# Patient Record
Sex: Male | Born: 1945 | Race: White | Hispanic: No | Marital: Married | Smoking: Former smoker
Health system: Southern US, Community
[De-identification: ages and names within clinical notes are randomized; demographics above are authoritative.]

## PROBLEM LIST (undated history)

## (undated) DIAGNOSIS — I509 Heart failure, unspecified: Secondary | ICD-10-CM

## (undated) DIAGNOSIS — R338 Other retention of urine: Secondary | ICD-10-CM

## (undated) DIAGNOSIS — I5042 Chronic combined systolic (congestive) and diastolic (congestive) heart failure: Secondary | ICD-10-CM

## (undated) DIAGNOSIS — I429 Cardiomyopathy, unspecified: Secondary | ICD-10-CM

## (undated) DIAGNOSIS — E782 Mixed hyperlipidemia: Secondary | ICD-10-CM

## (undated) DIAGNOSIS — I340 Nonrheumatic mitral (valve) insufficiency: Secondary | ICD-10-CM

## (undated) DIAGNOSIS — D638 Anemia in other chronic diseases classified elsewhere: Secondary | ICD-10-CM

## (undated) DIAGNOSIS — I639 Cerebral infarction, unspecified: Secondary | ICD-10-CM

## (undated) DIAGNOSIS — I4891 Unspecified atrial fibrillation: Secondary | ICD-10-CM

## (undated) DIAGNOSIS — E785 Hyperlipidemia, unspecified: Secondary | ICD-10-CM

## (undated) DIAGNOSIS — N390 Urinary tract infection, site not specified: Secondary | ICD-10-CM

## (undated) DIAGNOSIS — Z87442 Personal history of urinary calculi: Secondary | ICD-10-CM

## (undated) DIAGNOSIS — Z951 Presence of aortocoronary bypass graft: Secondary | ICD-10-CM

## (undated) DIAGNOSIS — E871 Hypo-osmolality and hyponatremia: Secondary | ICD-10-CM

## (undated) DIAGNOSIS — Z8673 Personal history of transient ischemic attack (TIA), and cerebral infarction without residual deficits: Secondary | ICD-10-CM

## (undated) DIAGNOSIS — I25119 Atherosclerotic heart disease of native coronary artery with unspecified angina pectoris: Secondary | ICD-10-CM

## (undated) DIAGNOSIS — I10 Essential (primary) hypertension: Secondary | ICD-10-CM

## (undated) DIAGNOSIS — I2510 Atherosclerotic heart disease of native coronary artery without angina pectoris: Secondary | ICD-10-CM

## (undated) DIAGNOSIS — I454 Nonspecific intraventricular block: Secondary | ICD-10-CM

## (undated) DIAGNOSIS — E119 Type 2 diabetes mellitus without complications: Secondary | ICD-10-CM

## (undated) DIAGNOSIS — N9989 Other postprocedural complications and disorders of genitourinary system: Secondary | ICD-10-CM

## (undated) DIAGNOSIS — I6601 Occlusion and stenosis of right middle cerebral artery: Secondary | ICD-10-CM

## (undated) DIAGNOSIS — N401 Enlarged prostate with lower urinary tract symptoms: Secondary | ICD-10-CM

## (undated) DIAGNOSIS — A499 Bacterial infection, unspecified: Secondary | ICD-10-CM

## (undated) DIAGNOSIS — D62 Acute posthemorrhagic anemia: Secondary | ICD-10-CM

## (undated) DIAGNOSIS — E1110 Type 2 diabetes mellitus with ketoacidosis without coma: Secondary | ICD-10-CM

## (undated) DIAGNOSIS — I6930 Unspecified sequelae of cerebral infarction: Secondary | ICD-10-CM

## (undated) HISTORY — DX: Hypo-osmolality and hyponatremia: E87.1

## (undated) HISTORY — DX: Nonrheumatic mitral (valve) insufficiency: I34.0

## (undated) HISTORY — DX: Unspecified sequelae of cerebral infarction: I69.30

## (undated) HISTORY — DX: Anemia in other chronic diseases classified elsewhere: D63.8

## (undated) HISTORY — DX: Type 2 diabetes mellitus without complications: E11.9

## (undated) HISTORY — PX: CARDIAC CATHETERIZATION: SHX172

## (undated) HISTORY — DX: Occlusion and stenosis of right middle cerebral artery: I66.01

## (undated) HISTORY — DX: Personal history of transient ischemic attack (TIA), and cerebral infarction without residual deficits: Z86.73

## (undated) HISTORY — DX: Mixed hyperlipidemia: E78.2

## (undated) HISTORY — DX: Essential (primary) hypertension: I10

## (undated) HISTORY — DX: Presence of aortocoronary bypass graft: Z95.1

## (undated) HISTORY — DX: Benign prostatic hyperplasia with lower urinary tract symptoms: N40.1

## (undated) HISTORY — DX: Acute posthemorrhagic anemia: D62

## (undated) HISTORY — DX: Heart failure, unspecified: I50.9

## (undated) HISTORY — DX: Nonspecific intraventricular block: I45.4

## (undated) HISTORY — DX: Urinary tract infection, site not specified: A49.9

## (undated) HISTORY — DX: Unspecified atrial fibrillation: I48.91

## (undated) HISTORY — PX: TONSILLECTOMY: SUR1361

## (undated) HISTORY — DX: Cardiomyopathy, unspecified: I42.9

## (undated) HISTORY — PX: BRAIN SURGERY: SHX531

## (undated) HISTORY — DX: Chronic combined systolic (congestive) and diastolic (congestive) heart failure: I50.42

## (undated) HISTORY — DX: Urinary tract infection, site not specified: N39.0

## (undated) HISTORY — DX: Type 2 diabetes mellitus with ketoacidosis without coma: E11.10

## (undated) HISTORY — DX: Hyperlipidemia, unspecified: E78.5

## (undated) HISTORY — DX: Atherosclerotic heart disease of native coronary artery with unspecified angina pectoris: I25.119

## (undated) HISTORY — DX: Cerebral infarction, unspecified: I63.9

---

## 2014-07-08 DIAGNOSIS — I110 Hypertensive heart disease with heart failure: Secondary | ICD-10-CM | POA: Diagnosis not present

## 2014-07-08 DIAGNOSIS — E785 Hyperlipidemia, unspecified: Secondary | ICD-10-CM | POA: Diagnosis not present

## 2014-07-08 DIAGNOSIS — I447 Left bundle-branch block, unspecified: Secondary | ICD-10-CM | POA: Diagnosis not present

## 2014-07-08 DIAGNOSIS — I5032 Chronic diastolic (congestive) heart failure: Secondary | ICD-10-CM | POA: Diagnosis not present

## 2014-07-08 DIAGNOSIS — E119 Type 2 diabetes mellitus without complications: Secondary | ICD-10-CM | POA: Diagnosis not present

## 2015-04-24 DIAGNOSIS — I429 Cardiomyopathy, unspecified: Secondary | ICD-10-CM | POA: Insufficient documentation

## 2015-04-24 DIAGNOSIS — I480 Paroxysmal atrial fibrillation: Secondary | ICD-10-CM

## 2015-04-24 DIAGNOSIS — I454 Nonspecific intraventricular block: Secondary | ICD-10-CM

## 2015-04-24 DIAGNOSIS — I5042 Chronic combined systolic (congestive) and diastolic (congestive) heart failure: Secondary | ICD-10-CM

## 2015-04-24 DIAGNOSIS — I5022 Chronic systolic (congestive) heart failure: Secondary | ICD-10-CM

## 2015-04-24 HISTORY — DX: Chronic combined systolic (congestive) and diastolic (congestive) heart failure: I50.42

## 2015-04-24 HISTORY — DX: Chronic systolic (congestive) heart failure: I50.22

## 2015-04-24 HISTORY — DX: Paroxysmal atrial fibrillation: I48.0

## 2015-04-24 HISTORY — DX: Nonspecific intraventricular block: I45.4

## 2015-04-25 DIAGNOSIS — E785 Hyperlipidemia, unspecified: Secondary | ICD-10-CM | POA: Diagnosis not present

## 2015-04-25 DIAGNOSIS — I429 Cardiomyopathy, unspecified: Secondary | ICD-10-CM | POA: Diagnosis not present

## 2015-04-25 DIAGNOSIS — I110 Hypertensive heart disease with heart failure: Secondary | ICD-10-CM | POA: Diagnosis not present

## 2015-04-25 DIAGNOSIS — I480 Paroxysmal atrial fibrillation: Secondary | ICD-10-CM | POA: Diagnosis not present

## 2015-04-25 DIAGNOSIS — I5042 Chronic combined systolic (congestive) and diastolic (congestive) heart failure: Secondary | ICD-10-CM | POA: Diagnosis not present

## 2015-07-30 DIAGNOSIS — E1165 Type 2 diabetes mellitus with hyperglycemia: Secondary | ICD-10-CM | POA: Diagnosis not present

## 2015-07-30 DIAGNOSIS — Z79899 Other long term (current) drug therapy: Secondary | ICD-10-CM | POA: Diagnosis not present

## 2015-07-30 DIAGNOSIS — Z0000 Encounter for general adult medical examination without abnormal findings: Secondary | ICD-10-CM | POA: Diagnosis not present

## 2015-07-30 DIAGNOSIS — I10 Essential (primary) hypertension: Secondary | ICD-10-CM | POA: Diagnosis not present

## 2015-07-30 DIAGNOSIS — E559 Vitamin D deficiency, unspecified: Secondary | ICD-10-CM | POA: Diagnosis not present

## 2015-07-30 DIAGNOSIS — Z125 Encounter for screening for malignant neoplasm of prostate: Secondary | ICD-10-CM | POA: Diagnosis not present

## 2015-07-30 DIAGNOSIS — E782 Mixed hyperlipidemia: Secondary | ICD-10-CM | POA: Diagnosis not present

## 2015-08-11 DIAGNOSIS — I10 Essential (primary) hypertension: Secondary | ICD-10-CM | POA: Diagnosis not present

## 2015-08-11 DIAGNOSIS — E782 Mixed hyperlipidemia: Secondary | ICD-10-CM | POA: Diagnosis not present

## 2015-08-11 DIAGNOSIS — E1165 Type 2 diabetes mellitus with hyperglycemia: Secondary | ICD-10-CM | POA: Diagnosis not present

## 2015-08-25 DIAGNOSIS — I429 Cardiomyopathy, unspecified: Secondary | ICD-10-CM | POA: Diagnosis not present

## 2015-08-25 DIAGNOSIS — I447 Left bundle-branch block, unspecified: Secondary | ICD-10-CM | POA: Diagnosis not present

## 2015-08-28 DIAGNOSIS — I5022 Chronic systolic (congestive) heart failure: Secondary | ICD-10-CM | POA: Diagnosis not present

## 2015-08-28 DIAGNOSIS — E1165 Type 2 diabetes mellitus with hyperglycemia: Secondary | ICD-10-CM | POA: Diagnosis not present

## 2015-08-28 DIAGNOSIS — Z79899 Other long term (current) drug therapy: Secondary | ICD-10-CM | POA: Diagnosis not present

## 2015-11-19 DIAGNOSIS — I447 Left bundle-branch block, unspecified: Secondary | ICD-10-CM | POA: Diagnosis not present

## 2015-11-19 DIAGNOSIS — I5042 Chronic combined systolic (congestive) and diastolic (congestive) heart failure: Secondary | ICD-10-CM | POA: Diagnosis not present

## 2015-11-19 DIAGNOSIS — I4891 Unspecified atrial fibrillation: Secondary | ICD-10-CM | POA: Diagnosis not present

## 2015-11-19 DIAGNOSIS — I454 Nonspecific intraventricular block: Secondary | ICD-10-CM | POA: Diagnosis not present

## 2015-11-19 DIAGNOSIS — I429 Cardiomyopathy, unspecified: Secondary | ICD-10-CM | POA: Diagnosis not present

## 2016-02-23 DIAGNOSIS — I447 Left bundle-branch block, unspecified: Secondary | ICD-10-CM | POA: Diagnosis not present

## 2016-02-25 DIAGNOSIS — I447 Left bundle-branch block, unspecified: Secondary | ICD-10-CM | POA: Diagnosis not present

## 2016-03-10 DIAGNOSIS — I5042 Chronic combined systolic (congestive) and diastolic (congestive) heart failure: Secondary | ICD-10-CM | POA: Diagnosis not present

## 2016-03-10 DIAGNOSIS — I110 Hypertensive heart disease with heart failure: Secondary | ICD-10-CM | POA: Diagnosis not present

## 2016-03-10 DIAGNOSIS — I447 Left bundle-branch block, unspecified: Secondary | ICD-10-CM | POA: Diagnosis not present

## 2016-04-20 DIAGNOSIS — E1165 Type 2 diabetes mellitus with hyperglycemia: Secondary | ICD-10-CM | POA: Diagnosis not present

## 2016-04-20 DIAGNOSIS — E782 Mixed hyperlipidemia: Secondary | ICD-10-CM | POA: Diagnosis not present

## 2016-04-20 DIAGNOSIS — I119 Hypertensive heart disease without heart failure: Secondary | ICD-10-CM | POA: Diagnosis not present

## 2016-04-20 DIAGNOSIS — I10 Essential (primary) hypertension: Secondary | ICD-10-CM | POA: Diagnosis not present

## 2016-04-20 DIAGNOSIS — Z1211 Encounter for screening for malignant neoplasm of colon: Secondary | ICD-10-CM | POA: Diagnosis not present

## 2016-08-11 DIAGNOSIS — J40 Bronchitis, not specified as acute or chronic: Secondary | ICD-10-CM | POA: Diagnosis not present

## 2016-08-11 DIAGNOSIS — J329 Chronic sinusitis, unspecified: Secondary | ICD-10-CM | POA: Diagnosis not present

## 2016-10-13 DIAGNOSIS — N200 Calculus of kidney: Secondary | ICD-10-CM | POA: Diagnosis not present

## 2016-10-15 DIAGNOSIS — R109 Unspecified abdominal pain: Secondary | ICD-10-CM | POA: Diagnosis not present

## 2016-10-15 DIAGNOSIS — Z87442 Personal history of urinary calculi: Secondary | ICD-10-CM | POA: Diagnosis not present

## 2016-10-15 DIAGNOSIS — N201 Calculus of ureter: Secondary | ICD-10-CM | POA: Diagnosis not present

## 2016-10-18 DIAGNOSIS — I110 Hypertensive heart disease with heart failure: Secondary | ICD-10-CM | POA: Diagnosis not present

## 2016-10-18 DIAGNOSIS — I447 Left bundle-branch block, unspecified: Secondary | ICD-10-CM | POA: Diagnosis not present

## 2016-10-18 DIAGNOSIS — I5042 Chronic combined systolic (congestive) and diastolic (congestive) heart failure: Secondary | ICD-10-CM | POA: Diagnosis not present

## 2016-10-18 DIAGNOSIS — I340 Nonrheumatic mitral (valve) insufficiency: Secondary | ICD-10-CM | POA: Diagnosis not present

## 2016-10-25 DIAGNOSIS — N201 Calculus of ureter: Secondary | ICD-10-CM | POA: Diagnosis not present

## 2016-10-25 DIAGNOSIS — N200 Calculus of kidney: Secondary | ICD-10-CM | POA: Diagnosis not present

## 2016-10-25 DIAGNOSIS — R1032 Left lower quadrant pain: Secondary | ICD-10-CM | POA: Diagnosis not present

## 2016-10-25 DIAGNOSIS — N401 Enlarged prostate with lower urinary tract symptoms: Secondary | ICD-10-CM | POA: Diagnosis not present

## 2016-10-28 DIAGNOSIS — Z01810 Encounter for preprocedural cardiovascular examination: Secondary | ICD-10-CM | POA: Diagnosis not present

## 2016-10-28 DIAGNOSIS — I2510 Atherosclerotic heart disease of native coronary artery without angina pectoris: Secondary | ICD-10-CM | POA: Diagnosis not present

## 2016-10-28 DIAGNOSIS — Z79899 Other long term (current) drug therapy: Secondary | ICD-10-CM | POA: Diagnosis not present

## 2016-10-28 DIAGNOSIS — Z87891 Personal history of nicotine dependence: Secondary | ICD-10-CM | POA: Diagnosis not present

## 2016-10-28 DIAGNOSIS — I10 Essential (primary) hypertension: Secondary | ICD-10-CM | POA: Diagnosis not present

## 2016-10-28 DIAGNOSIS — Z7984 Long term (current) use of oral hypoglycemic drugs: Secondary | ICD-10-CM | POA: Diagnosis not present

## 2016-10-28 DIAGNOSIS — I499 Cardiac arrhythmia, unspecified: Secondary | ICD-10-CM | POA: Diagnosis not present

## 2016-10-28 DIAGNOSIS — E119 Type 2 diabetes mellitus without complications: Secondary | ICD-10-CM | POA: Diagnosis not present

## 2016-10-28 DIAGNOSIS — N201 Calculus of ureter: Secondary | ICD-10-CM | POA: Diagnosis not present

## 2016-10-29 DIAGNOSIS — I499 Cardiac arrhythmia, unspecified: Secondary | ICD-10-CM | POA: Diagnosis not present

## 2016-10-29 DIAGNOSIS — Z79899 Other long term (current) drug therapy: Secondary | ICD-10-CM | POA: Diagnosis not present

## 2016-10-29 DIAGNOSIS — I10 Essential (primary) hypertension: Secondary | ICD-10-CM | POA: Diagnosis not present

## 2016-10-29 DIAGNOSIS — N202 Calculus of kidney with calculus of ureter: Secondary | ICD-10-CM | POA: Diagnosis not present

## 2016-10-29 DIAGNOSIS — Z7984 Long term (current) use of oral hypoglycemic drugs: Secondary | ICD-10-CM | POA: Diagnosis not present

## 2016-10-29 DIAGNOSIS — E119 Type 2 diabetes mellitus without complications: Secondary | ICD-10-CM | POA: Diagnosis not present

## 2016-10-29 DIAGNOSIS — Z87891 Personal history of nicotine dependence: Secondary | ICD-10-CM | POA: Diagnosis not present

## 2016-10-29 DIAGNOSIS — I2510 Atherosclerotic heart disease of native coronary artery without angina pectoris: Secondary | ICD-10-CM | POA: Diagnosis not present

## 2016-10-29 DIAGNOSIS — N201 Calculus of ureter: Secondary | ICD-10-CM | POA: Diagnosis not present

## 2016-11-08 DIAGNOSIS — N201 Calculus of ureter: Secondary | ICD-10-CM | POA: Diagnosis not present

## 2016-11-08 DIAGNOSIS — R1032 Left lower quadrant pain: Secondary | ICD-10-CM | POA: Diagnosis not present

## 2016-11-08 DIAGNOSIS — N200 Calculus of kidney: Secondary | ICD-10-CM | POA: Diagnosis not present

## 2016-11-15 DIAGNOSIS — I447 Left bundle-branch block, unspecified: Secondary | ICD-10-CM | POA: Diagnosis not present

## 2016-11-15 DIAGNOSIS — I5042 Chronic combined systolic (congestive) and diastolic (congestive) heart failure: Secondary | ICD-10-CM | POA: Diagnosis not present

## 2016-11-22 DIAGNOSIS — K529 Noninfective gastroenteritis and colitis, unspecified: Secondary | ICD-10-CM | POA: Diagnosis not present

## 2016-11-26 DIAGNOSIS — E782 Mixed hyperlipidemia: Secondary | ICD-10-CM | POA: Insufficient documentation

## 2016-11-26 DIAGNOSIS — E1165 Type 2 diabetes mellitus with hyperglycemia: Secondary | ICD-10-CM | POA: Diagnosis not present

## 2016-11-26 DIAGNOSIS — I10 Essential (primary) hypertension: Secondary | ICD-10-CM

## 2016-11-26 DIAGNOSIS — I110 Hypertensive heart disease with heart failure: Secondary | ICD-10-CM | POA: Insufficient documentation

## 2016-11-26 DIAGNOSIS — I429 Cardiomyopathy, unspecified: Secondary | ICD-10-CM | POA: Diagnosis not present

## 2016-11-26 HISTORY — DX: Mixed hyperlipidemia: E78.2

## 2016-11-26 HISTORY — DX: Essential (primary) hypertension: I10

## 2016-11-26 HISTORY — DX: Hypertensive heart disease with heart failure: I11.0

## 2016-12-27 DIAGNOSIS — N200 Calculus of kidney: Secondary | ICD-10-CM | POA: Diagnosis not present

## 2016-12-27 DIAGNOSIS — N401 Enlarged prostate with lower urinary tract symptoms: Secondary | ICD-10-CM | POA: Diagnosis not present

## 2017-04-07 DIAGNOSIS — R42 Dizziness and giddiness: Secondary | ICD-10-CM | POA: Diagnosis not present

## 2017-04-07 DIAGNOSIS — H6123 Impacted cerumen, bilateral: Secondary | ICD-10-CM | POA: Diagnosis not present

## 2017-04-11 DIAGNOSIS — M791 Myalgia, unspecified site: Secondary | ICD-10-CM | POA: Diagnosis not present

## 2017-04-11 DIAGNOSIS — H6011 Cellulitis of right external ear: Secondary | ICD-10-CM | POA: Diagnosis not present

## 2017-04-28 ENCOUNTER — Inpatient Hospital Stay (HOSPITAL_COMMUNITY)
Admission: AD | Admit: 2017-04-28 | Discharge: 2017-05-03 | DRG: 023 | Disposition: A | Discharge status: Home Health | Payer: Medicare Other | Source: Other Acute Inpatient Hospital | Attending: Neurology | Admitting: Neurology

## 2017-04-28 DIAGNOSIS — I952 Hypotension due to drugs: Secondary | ICD-10-CM | POA: Diagnosis not present

## 2017-04-28 DIAGNOSIS — I340 Nonrheumatic mitral (valve) insufficiency: Secondary | ICD-10-CM | POA: Diagnosis not present

## 2017-04-28 DIAGNOSIS — I110 Hypertensive heart disease with heart failure: Secondary | ICD-10-CM | POA: Diagnosis present

## 2017-04-28 DIAGNOSIS — Z6834 Body mass index (BMI) 34.0-34.9, adult: Secondary | ICD-10-CM

## 2017-04-28 DIAGNOSIS — I255 Ischemic cardiomyopathy: Secondary | ICD-10-CM | POA: Diagnosis not present

## 2017-04-28 DIAGNOSIS — I63411 Cerebral infarction due to embolism of right middle cerebral artery: Secondary | ICD-10-CM | POA: Diagnosis present

## 2017-04-28 DIAGNOSIS — E1110 Type 2 diabetes mellitus with ketoacidosis without coma: Secondary | ICD-10-CM

## 2017-04-28 DIAGNOSIS — I639 Cerebral infarction, unspecified: Secondary | ICD-10-CM

## 2017-04-28 DIAGNOSIS — I4891 Unspecified atrial fibrillation: Secondary | ICD-10-CM | POA: Diagnosis present

## 2017-04-28 DIAGNOSIS — I959 Hypotension, unspecified: Secondary | ICD-10-CM

## 2017-04-28 DIAGNOSIS — Z7984 Long term (current) use of oral hypoglycemic drugs: Secondary | ICD-10-CM

## 2017-04-28 DIAGNOSIS — I480 Paroxysmal atrial fibrillation: Secondary | ICD-10-CM

## 2017-04-28 DIAGNOSIS — Z79899 Other long term (current) drug therapy: Secondary | ICD-10-CM

## 2017-04-28 DIAGNOSIS — E785 Hyperlipidemia, unspecified: Secondary | ICD-10-CM | POA: Diagnosis not present

## 2017-04-28 DIAGNOSIS — E1165 Type 2 diabetes mellitus with hyperglycemia: Secondary | ICD-10-CM | POA: Diagnosis not present

## 2017-04-28 DIAGNOSIS — I5042 Chronic combined systolic (congestive) and diastolic (congestive) heart failure: Secondary | ICD-10-CM | POA: Diagnosis present

## 2017-04-28 DIAGNOSIS — I420 Dilated cardiomyopathy: Secondary | ICD-10-CM

## 2017-04-28 DIAGNOSIS — R29713 NIHSS score 13: Secondary | ICD-10-CM | POA: Diagnosis present

## 2017-04-28 DIAGNOSIS — I619 Nontraumatic intracerebral hemorrhage, unspecified: Secondary | ICD-10-CM | POA: Diagnosis present

## 2017-04-28 DIAGNOSIS — E669 Obesity, unspecified: Secondary | ICD-10-CM | POA: Diagnosis present

## 2017-04-28 DIAGNOSIS — I63511 Cerebral infarction due to unspecified occlusion or stenosis of right middle cerebral artery: Secondary | ICD-10-CM | POA: Diagnosis not present

## 2017-04-28 DIAGNOSIS — B3324 Viral cardiomyopathy: Secondary | ICD-10-CM | POA: Diagnosis present

## 2017-04-28 DIAGNOSIS — I5022 Chronic systolic (congestive) heart failure: Secondary | ICD-10-CM | POA: Diagnosis not present

## 2017-04-28 DIAGNOSIS — J9600 Acute respiratory failure, unspecified whether with hypoxia or hypercapnia: Secondary | ICD-10-CM

## 2017-04-28 DIAGNOSIS — R0681 Apnea, not elsewhere classified: Secondary | ICD-10-CM | POA: Diagnosis not present

## 2017-04-28 DIAGNOSIS — T464X5A Adverse effect of angiotensin-converting-enzyme inhibitors, initial encounter: Secondary | ICD-10-CM | POA: Diagnosis not present

## 2017-04-28 DIAGNOSIS — T461X5A Adverse effect of calcium-channel blockers, initial encounter: Secondary | ICD-10-CM | POA: Diagnosis not present

## 2017-04-28 DIAGNOSIS — I509 Heart failure, unspecified: Secondary | ICD-10-CM

## 2017-04-28 DIAGNOSIS — I6330 Cerebral infarction due to thrombosis of unspecified cerebral artery: Secondary | ICD-10-CM

## 2017-04-28 DIAGNOSIS — R29810 Facial weakness: Secondary | ICD-10-CM | POA: Diagnosis present

## 2017-04-28 DIAGNOSIS — I6389 Other cerebral infarction: Secondary | ICD-10-CM | POA: Diagnosis not present

## 2017-04-28 DIAGNOSIS — Z7982 Long term (current) use of aspirin: Secondary | ICD-10-CM

## 2017-04-28 DIAGNOSIS — D72829 Elevated white blood cell count, unspecified: Secondary | ICD-10-CM | POA: Diagnosis not present

## 2017-04-28 DIAGNOSIS — G8194 Hemiplegia, unspecified affecting left nondominant side: Secondary | ICD-10-CM | POA: Diagnosis present

## 2017-04-28 DIAGNOSIS — Z9282 Status post administration of tPA (rtPA) in a different facility within the last 24 hours prior to admission to current facility: Secondary | ICD-10-CM

## 2017-04-29 ENCOUNTER — Encounter (HOSPITAL_COMMUNITY)
Admission: AD | Disposition: A | Discharge status: Home Health | Payer: Self-pay | Source: Other Acute Inpatient Hospital | Attending: Neurology

## 2017-04-29 ENCOUNTER — Inpatient Hospital Stay (HOSPITAL_COMMUNITY): Payer: Medicare Other

## 2017-04-29 ENCOUNTER — Inpatient Hospital Stay (HOSPITAL_COMMUNITY): Payer: Medicare Other | Admitting: Certified Registered"

## 2017-04-29 ENCOUNTER — Encounter (HOSPITAL_COMMUNITY): Payer: Self-pay | Admitting: Radiology

## 2017-04-29 DIAGNOSIS — I63311 Cerebral infarction due to thrombosis of right middle cerebral artery: Secondary | ICD-10-CM

## 2017-04-29 DIAGNOSIS — I420 Dilated cardiomyopathy: Secondary | ICD-10-CM

## 2017-04-29 DIAGNOSIS — I340 Nonrheumatic mitral (valve) insufficiency: Secondary | ICD-10-CM

## 2017-04-29 DIAGNOSIS — I480 Paroxysmal atrial fibrillation: Secondary | ICD-10-CM

## 2017-04-29 DIAGNOSIS — I639 Cerebral infarction, unspecified: Secondary | ICD-10-CM

## 2017-04-29 DIAGNOSIS — I63411 Cerebral infarction due to embolism of right middle cerebral artery: Secondary | ICD-10-CM

## 2017-04-29 DIAGNOSIS — I63511 Cerebral infarction due to unspecified occlusion or stenosis of right middle cerebral artery: Principal | ICD-10-CM

## 2017-04-29 HISTORY — DX: Cerebral infarction due to embolism of right middle cerebral artery: I63.411

## 2017-04-29 HISTORY — PX: IR US GUIDE VASC ACCESS LEFT: IMG2389

## 2017-04-29 HISTORY — PX: IR PERCUTANEOUS ART THROMBECTOMY/INFUSION INTRACRANIAL INC DIAG ANGIO: IMG6087

## 2017-04-29 HISTORY — PX: IR US GUIDE VASC ACCESS RIGHT: IMG2390

## 2017-04-29 HISTORY — DX: Cerebral infarction, unspecified: I63.9

## 2017-04-29 HISTORY — PX: RADIOLOGY WITH ANESTHESIA: SHX6223

## 2017-04-29 LAB — GLUCOSE, CAPILLARY
GLUCOSE-CAPILLARY: 328 mg/dL — AB (ref 65–99)
GLUCOSE-CAPILLARY: 315 mg/dL — AB (ref 65–99)
GLUCOSE-CAPILLARY: 300 mg/dL — AB (ref 65–99)
GLUCOSE-CAPILLARY: 217 mg/dL — AB (ref 65–99)
Glucose-Capillary: 273 mg/dL — ABNORMAL HIGH (ref 65–99)
Glucose-Capillary: 311 mg/dL — ABNORMAL HIGH (ref 65–99)
Glucose-Capillary: 359 mg/dL — ABNORMAL HIGH (ref 65–99)
Glucose-Capillary: 272 mg/dL — ABNORMAL HIGH (ref 65–99)
Glucose-Capillary: 251 mg/dL — ABNORMAL HIGH (ref 65–99)
Glucose-Capillary: 147 mg/dL — ABNORMAL HIGH (ref 65–99)
Glucose-Capillary: 184 mg/dL — ABNORMAL HIGH (ref 65–99)
Glucose-Capillary: 136 mg/dL — ABNORMAL HIGH (ref 65–99)

## 2017-04-29 LAB — COMPREHENSIVE METABOLIC PANEL WITH GFR
ALT: 22 U/L (ref 17–63)
AST: 22 U/L (ref 15–41)
Albumin: 3.7 g/dL (ref 3.5–5.0)
Alkaline Phosphatase: 92 U/L (ref 38–126)
Anion gap: 7 (ref 5–15)
BUN: 16 mg/dL (ref 6–20)
CO2: 23 mmol/L (ref 22–32)
Calcium: 8.8 mg/dL — ABNORMAL LOW (ref 8.9–10.3)
Chloride: 103 mmol/L (ref 101–111)
Creatinine, Ser: 0.89 mg/dL (ref 0.61–1.24)
GFR calc Af Amer: >60 mL/min
GFR calc non Af Amer: >60 mL/min
Glucose, Bld: 273 mg/dL — ABNORMAL HIGH (ref 65–99)
Potassium: 4.4 mmol/L (ref 3.5–5.1)
Sodium: 133 mmol/L — ABNORMAL LOW (ref 135–145)
Total Bilirubin: 0.9 mg/dL (ref 0.3–1.2)
Total Protein: 7.2 g/dL (ref 6.5–8.1)

## 2017-04-29 LAB — CBC
HEMATOCRIT: 39.6 % (ref 39.0–52.0)
HEMOGLOBIN: 13.8 g/dL (ref 13.0–17.0)
MCH: 31.7 pg (ref 26.0–34.0)
MCHC: 34.8 g/dL (ref 30.0–36.0)
MCV: 91 fL (ref 78.0–100.0)
Platelets: 205 10*3/uL (ref 150–400)
RBC: 4.35 MIL/uL (ref 4.22–5.81)
RDW: 12.1 % (ref 11.5–15.5)
WBC: 14.3 10*3/uL — AB (ref 4.0–10.5)

## 2017-04-29 LAB — MRSA PCR SCREENING: MRSA BY PCR: NEGATIVE

## 2017-04-29 LAB — POCT I-STAT 3, ART BLOOD GAS (G3+)
Acid-base deficit: 4 mmol/L — ABNORMAL HIGH (ref 0.0–2.0)
BICARBONATE: 22.2 mmol/L (ref 20.0–28.0)
O2 Saturation: 97 %
PCO2 ART: 41.9 mmHg (ref 32.0–48.0)
PH ART: 7.33 — AB (ref 7.350–7.450)
PO2 ART: 92 mmHg (ref 83.0–108.0)
Patient temperature: 97.6
TCO2: 24 mmol/L (ref 22–32)

## 2017-04-29 LAB — ECHOCARDIOGRAM COMPLETE
Ao-asc: 35 cm
CHL CUP MV DEC (S): 190
CHL CUP REG VEL DIAS: 86.9 cm/s
E decel time: 190 msec
FS: 20 % — AB (ref 28–44)
IV/PV OW: 1
LA vol index: 28.9 mL/m2
LADIAMINDEX: 1.86 cm/m2
LASIZE: 45 mm
LAVOL: 69.7 mL
LAVOLA4C: 56.4 mL
LDCA: 3.8 cm2
LEFT ATRIUM END SYS DIAM: 45 mm
LV PW d: 11 mm — AB (ref 0.6–1.1)
LV TDI E'LATERAL: 7.72
LV e' LATERAL: 7.72 cm/s
LVOT diameter: 22 mm
MV VTI: 148 cm
MVPKEVEL: 1.1 m/s
RV LATERAL S' VELOCITY: 21.8 cm/s
TAPSE: 20.8 mm
TDI e' medial: 5.33

## 2017-04-29 LAB — LIPID PANEL
Cholesterol: 102 mg/dL (ref 0–200)
HDL: 30 mg/dL — ABNORMAL LOW
LDL Cholesterol: 51 mg/dL (ref 0–99)
Total CHOL/HDL Ratio: 3.4 ratio
Triglycerides: 104 mg/dL
VLDL: 21 mg/dL (ref 0–40)

## 2017-04-29 LAB — HEMOGLOBIN A1C
Hgb A1c MFr Bld: 8.4 % — ABNORMAL HIGH (ref 4.8–5.6)
Mean Plasma Glucose: 194.38 mg/dL

## 2017-04-29 SURGERY — IR WITH ANESTHESIA
Anesthesia: General

## 2017-04-29 MED ORDER — INSULIN ASPART 100 UNIT/ML ~~LOC~~ SOLN: 2.0000 [IU] | SUBCUTANEOUS | Status: DC

## 2017-04-29 MED ORDER — ASPIRIN EC 81 MG PO TBEC
81.0000 mg | DELAYED_RELEASE_TABLET | Freq: Every day | ORAL | Status: DC
  Filled 2017-04-29: qty 1

## 2017-04-29 MED ORDER — SUFENTANIL CITRATE 50 MCG/ML IV SOLN
INTRAVENOUS | Status: DC | PRN
  Administered 2017-04-29 (×3): 10 ug via INTRAVENOUS
  Administered 2017-04-29: 20 ug via INTRAVENOUS

## 2017-04-29 MED ORDER — LIDOCAINE HCL (CARDIAC) 20 MG/ML IV SOLN
INTRAVENOUS | Status: DC | PRN
  Administered 2017-04-29: 100 mg via INTRATRACHEAL

## 2017-04-29 MED ORDER — CEFAZOLIN SODIUM-DEXTROSE 2-4 GM/100ML-% IV SOLN
INTRAVENOUS | Status: AC
  Filled 2017-04-29: qty 100

## 2017-04-29 MED ORDER — ONDANSETRON HCL 4 MG/2ML IJ SOLN
INTRAMUSCULAR | Status: DC | PRN
  Administered 2017-04-29: 4 mg via INTRAVENOUS

## 2017-04-29 MED ORDER — PHENYLEPHRINE HCL 10 MG/ML IJ SOLN
INTRAMUSCULAR | Status: DC | PRN
  Administered 2017-04-29: 80 ug via INTRAVENOUS
  Administered 2017-04-29: 160 ug via INTRAVENOUS

## 2017-04-29 MED ORDER — PANTOPRAZOLE SODIUM 40 MG IV SOLR: 40.0000 mg | Freq: Every day | INTRAVENOUS | Status: DC

## 2017-04-29 MED ORDER — ATROPINE SULFATE 0.4 MG/ML IJ SOLN
INTRAMUSCULAR | Status: DC | PRN
  Administered 2017-04-29: 0.4 mg via INTRAVENOUS

## 2017-04-29 MED ORDER — IOPAMIDOL (ISOVUE-370) INJECTION 76%
INTRAVENOUS | Status: AC
  Filled 2017-04-29: qty 50

## 2017-04-29 MED ORDER — INSULIN ASPART 100 UNIT/ML ~~LOC~~ SOLN: 0.0000 [IU] | SUBCUTANEOUS | Status: DC

## 2017-04-29 MED ORDER — ACETAMINOPHEN 650 MG RE SUPP: 650.0000 mg | RECTAL | Status: DC | PRN

## 2017-04-29 MED ORDER — SODIUM CHLORIDE 0.9 % IV SOLN
INTRAVENOUS | Status: DC
  Administered 2017-04-29: 2.6 [IU]/h via INTRAVENOUS
  Filled 2017-04-29: qty 1

## 2017-04-29 MED ORDER — IOPAMIDOL (ISOVUE-300) INJECTION 61%
INTRAVENOUS | Status: AC
  Administered 2017-04-29: 75 mL
  Filled 2017-04-29: qty 300

## 2017-04-29 MED ORDER — NITROGLYCERIN 1 MG/10 ML FOR IR/CATH LAB
INTRA_ARTERIAL | Status: AC
  Filled 2017-04-29: qty 10

## 2017-04-29 MED ORDER — SUFENTANIL CITRATE 50 MCG/ML IV SOLN
INTRAVENOUS | Status: AC
  Filled 2017-04-29: qty 1

## 2017-04-29 MED ORDER — LACTATED RINGERS IV SOLN
INTRAVENOUS | Status: DC | PRN
  Administered 2017-04-29: 02:00:00 via INTRAVENOUS

## 2017-04-29 MED ORDER — ACETAMINOPHEN 160 MG/5ML PO SOLN: 650.0000 mg | ORAL | Status: DC | PRN

## 2017-04-29 MED ORDER — CEFAZOLIN SODIUM-DEXTROSE 2-3 GM-%(50ML) IV SOLR
INTRAVENOUS | Status: DC | PRN
  Administered 2017-04-29: 2 g via INTRAVENOUS

## 2017-04-29 MED ORDER — PERFLUTREN LIPID MICROSPHERE
INTRAVENOUS | Status: AC
  Administered 2017-04-29: 2 mL
  Filled 2017-04-29: qty 10

## 2017-04-29 MED ORDER — ASPIRIN 325 MG PO TABS
325.0000 mg | ORAL_TABLET | Freq: Every day | ORAL | Status: DC
  Administered 2017-04-30 – 2017-05-02 (×4): 325 mg via ORAL
  Filled 2017-04-29 (×4): qty 1

## 2017-04-29 MED ORDER — SUGAMMADEX SODIUM 200 MG/2ML IV SOLN
INTRAVENOUS | Status: DC | PRN
  Administered 2017-04-29: 200 mg via INTRAVENOUS

## 2017-04-29 MED ORDER — CLEVIDIPINE BUTYRATE 0.5 MG/ML IV EMUL
0.0000 mg/h | INTRAVENOUS | Status: DC
  Administered 2017-04-29: 16 mg/h via INTRAVENOUS
  Administered 2017-04-29: 7 mg/h via INTRAVENOUS
  Filled 2017-04-29 (×3): qty 50

## 2017-04-29 MED ORDER — PROPOFOL 10 MG/ML IV BOLUS
INTRAVENOUS | Status: DC | PRN
  Administered 2017-04-29: 120 mg via INTRAVENOUS
  Administered 2017-04-29: 30 mg via INTRAVENOUS

## 2017-04-29 MED ORDER — ONDANSETRON HCL 4 MG/2ML IJ SOLN
4.0000 mg | Freq: Once | INTRAMUSCULAR | Status: AC
  Administered 2017-04-29: 4 mg via INTRAVENOUS

## 2017-04-29 MED ORDER — EPTIFIBATIDE 20 MG/10ML IV SOLN
INTRAVENOUS | Status: AC
  Filled 2017-04-29: qty 10

## 2017-04-29 MED ORDER — CLEVIDIPINE BUTYRATE 0.5 MG/ML IV EMUL
0.0000 mg/h | INTRAVENOUS | Status: DC
  Administered 2017-04-29: 16 mg/h via INTRAVENOUS
  Administered 2017-04-29: 1 mg/h via INTRAVENOUS
  Administered 2017-04-29: 16 mg/h via INTRAVENOUS
  Administered 2017-04-29: 17 mg/h via INTRAVENOUS
  Filled 2017-04-29 (×5): qty 50

## 2017-04-29 MED ORDER — SUCCINYLCHOLINE CHLORIDE 20 MG/ML IJ SOLN
INTRAMUSCULAR | Status: DC | PRN
  Administered 2017-04-29: 120 mg via INTRAVENOUS

## 2017-04-29 MED ORDER — INSULIN ASPART 100 UNIT/ML ~~LOC~~ SOLN
8.0000 [IU] | Freq: Once | SUBCUTANEOUS | Status: AC
  Administered 2017-04-29: 8 [IU] via SUBCUTANEOUS

## 2017-04-29 MED ORDER — STROKE: EARLY STAGES OF RECOVERY BOOK
Freq: Once | Status: AC
  Administered 2017-04-29: 01:00:00
  Filled 2017-04-29: qty 1

## 2017-04-29 MED ORDER — ROCURONIUM BROMIDE 100 MG/10ML IV SOLN
INTRAVENOUS | Status: DC | PRN
  Administered 2017-04-29: 50 mg via INTRAVENOUS

## 2017-04-29 MED ORDER — INSULIN ASPART 100 UNIT/ML ~~LOC~~ SOLN
0.0000 [IU] | SUBCUTANEOUS | Status: DC
  Administered 2017-04-30: 2 [IU] via SUBCUTANEOUS
  Administered 2017-04-30: 3 [IU] via SUBCUTANEOUS
  Administered 2017-04-30: 2 [IU] via SUBCUTANEOUS
  Administered 2017-04-30: 5 [IU] via SUBCUTANEOUS
  Administered 2017-04-30 (×2): 3 [IU] via SUBCUTANEOUS
  Administered 2017-05-01: 2 [IU] via SUBCUTANEOUS

## 2017-04-29 MED ORDER — PERFLUTREN LIPID MICROSPHERE
1.0000 mL | INTRAVENOUS | Status: AC | PRN
  Filled 2017-04-29: qty 10

## 2017-04-29 MED ORDER — PROPOFOL 10 MG/ML IV BOLUS
INTRAVENOUS | Status: AC
  Filled 2017-04-29: qty 20

## 2017-04-29 MED ORDER — ONDANSETRON HCL 4 MG/2ML IJ SOLN
4.0000 mg | Freq: Four times a day (QID) | INTRAMUSCULAR | Status: DC | PRN
  Administered 2017-05-01: 4 mg via INTRAVENOUS
  Filled 2017-04-29 (×2): qty 2

## 2017-04-29 MED ORDER — INSULIN ASPART 100 UNIT/ML ~~LOC~~ SOLN
0.0000 [IU] | SUBCUTANEOUS | Status: DC
  Administered 2017-04-29: 20 [IU] via SUBCUTANEOUS

## 2017-04-29 MED ORDER — SODIUM CHLORIDE 0.9 % IV SOLN
INTRAVENOUS | Status: DC
  Administered 2017-04-29 (×3): via INTRAVENOUS

## 2017-04-29 MED ORDER — ACETAMINOPHEN 325 MG PO TABS
650.0000 mg | ORAL_TABLET | ORAL | Status: DC | PRN
  Administered 2017-04-30 – 2017-05-03 (×3): 650 mg via ORAL
  Filled 2017-04-29 (×3): qty 2

## 2017-04-29 MED ORDER — ONDANSETRON HCL 4 MG/2ML IJ SOLN
INTRAMUSCULAR | Status: AC
  Filled 2017-04-29: qty 2

## 2017-04-29 MED ORDER — LABETALOL HCL 5 MG/ML IV SOLN
INTRAVENOUS | Status: DC | PRN
  Administered 2017-04-29: 5 mg via INTRAVENOUS
  Administered 2017-04-29: 10 mg via INTRAVENOUS
  Administered 2017-04-29: 5 mg via INTRAVENOUS

## 2017-04-29 MED ORDER — PHENYLEPHRINE HCL 10 MG/ML IJ SOLN
INTRAMUSCULAR | Status: DC | PRN
  Administered 2017-04-29: 50 ug/min via INTRAVENOUS

## 2017-04-29 MED ORDER — SENNOSIDES-DOCUSATE SODIUM 8.6-50 MG PO TABS: 1.0000 | ORAL_TABLET | Freq: Every evening | ORAL | Status: DC | PRN

## 2017-04-29 MED ORDER — DEXAMETHASONE SODIUM PHOSPHATE 10 MG/ML IJ SOLN
INTRAMUSCULAR | Status: DC | PRN
  Administered 2017-04-29: 10 mg via INTRAVENOUS

## 2017-04-29 MED FILL — Norepinephrine Bitartrate IV Soln 1 MG/ML (Base Equivalent): INTRAVENOUS | Qty: 4 | Status: AC

## 2017-04-29 NOTE — Progress Notes (Signed)
Bedside EEG completed, results pending. 

## 2017-04-29 NOTE — Anesthesia Postprocedure Evaluation (Signed)
Anesthesia Post Note  Patient: Nathan [name]  Procedure(s) Performed: IR WITH ANESTHESIA (N/A )     Patient location during evaluation: ICU Anesthesia Type: General Level of consciousness: awake and patient cooperative Pain management: pain level controlled Vital Signs Assessment: post-procedure vital signs reviewed and stable Respiratory status: spontaneous breathing, nonlabored ventilation, respiratory function stable and patient connected to nasal cannula oxygen Cardiovascular status: blood pressure returned to baseline and stable Postop Assessment: no apparent nausea or vomiting Anesthetic complications: no    Last Vitals:  Vitals:   04/29/17 0630 04/29/17 0645  BP: 115/70 108/64  Pulse: 76 79  Resp: 14 12  Temp:    SpO2: 100% 100%    Last Pain:  Vitals:   04/29/17 0400  TempSrc: Axillary                 Nathan [name]

## 2017-04-29 NOTE — Progress Notes (Signed)
15:20 left 4 FR sheath removed by Shanda BumpsJessica Loftis RT-R using hemostasis pad and manual pressure. Hemostasis obtained at 15:40. No complications, site reviewed with [place] RN. Tegaderm and guaze applied. Distal pulses in tact 2+.

## 2017-04-29 NOTE — H&P (Addendum)
Neurology H&P  CC: Left sided weakness  History is obtained from:patient, wife  HPI: Nathan [name] is a 71 y.o. male with a history of cardiomyopathy with an EF of 30% who presents with left-sided weakness that started presumably around 6:45 PM.  His wife last saw him well at 6:30 AM.  He then had several falls at home, and [facility] was [facility] where he was given IV TPA.  He had a CTA showing a relatively distal M2 occlusion, with very mild symptoms.  I advised emergency traffic transfer to the ICU here.  En route, he markedly worsened and therefore after he arrived he was taken for a stat CT perfusion which demonstrates a large penumbra. Of note, there was delay due to IV access, IV team was paged emergently and they responded and were able to get an 18 gauge IV.   IR was therefore called and he was taken for possible intervention.   LKW: 6:30 AM, though presumably his history was reliable earlier when he relayed 6:45 PM tpa given?:  [facility] NIHSS: 13 MRS: 0  ROS: A 14 point ROS was performed and is negative except as noted in the HPI.   Past medical history: Diabetes Viral cardiomyopathy(EF 30%)  Home medications: Aspirin 81 mg daily Carvedilol 25 mg p.o. twice daily Lisinopril 5 mg p.o. daily Simvastatin 40 mg p.o. nightly Tamsulosin 0.4 mg nightly Glipizide 10 mg p.o. twice daily Mobic 50 mg p.o. daily  Family history: No history of stroke  Social History: Denies smoking Occasional EtOH  Exam: Current vital signs: BP (!) 164/93  Vital signs in last 24 hours: BP: (164)/(93) 164/93 (11/02 0000)  Physical Exam  Constitutional: Appears well-developed and well-nourished.  Psych: Affect appropriate to situation Eyes: No scleral injection HENT: No OP obstrucion Head: Normocephalic.  Cardiovascular: Normal rate and regular rhythm.  Respiratory: Effort normal  GI: Soft.  No distension. There is no tenderness.  Skin: WDI Extremities:  He has 1+ pitting edema bilateral ankles  Neuro: Mental Status: Patient is awake, alert, oriented to person, place, month, year, and situation. Patient is able to give a clear and coherent history. No signs of aphasia. He has left hemineglect Cranial Nerves: II: Left hemianopia pupils are equal, round, and reactive to light.   III,IV, VI: Does not cross midline to the left V: Facial sensation is decreased on the left VII: Facial movement is decreased on the left VIII: hearing is intact to voice X: Uvula elevates symmetrically XI: Shoulder shrug is symmetric. XII: tongue is midline without atrophy or fasciculations.  Motor: He has normal strength in the right side, on the left he has a marked weakness of the left arm though he is able to resist gravity some, able to hold his legs with only mild drift  sensory: Sensation is decreased on the left, he extinguishes to double simultaneous stimulation Cerebellar: No ataxia on the right  I have reviewed labs in epic and the results pertinent to this consultation are: Creatinine 0.8 Sodium 138 chloride 101 BUN 20 potassium 4.9 CO2 24 WBC 10.2 platelets 217 hemoglobin 14.6   I have reviewed the images obtained: CT head - Aspect 10,  CT perfusion - 63 mL ischemia with no infarct.   Impression: 71 yo M with M2 occlusion.  Initially he had very mild symptoms(NIH 5 per referring physician) and therefore [facility] without definite plan for IR intervention.  En route, he decompensated  and therefore a stat CT perfusion was obtained on arrival which demonstrates significant penumbra.  Recommendations:  1. IR for possible intervention 2. MRI, MRA  of the brain without contrast 3. Frequent neuro checks 4. Echocardiogram 5. HgbA1c, fasting lipid panel 6. Prophylactic therapy-none for 24 hours 7. Risk factor modification 8. Telemetry monitoring 9. PT consult, OT consult, Speech consult 10. please page stroke NP  Or  PA   Or MD  from 8am -4 pm as this patient will be followed by the stroke team at this point.   You can look them up on [email or web address]    This patient is critically ill and at significant risk of neurological worsening, death and care requires constant monitoring of vital signs, hemodynamics,respiratory and cardiac monitoring, neurological assessment, discussion with family, other specialists and medical decision making of high complexity. I spent 60 minutes of neurocritical care time  in the care of  this patient.  Ritta Slot, MD [facility] [phone]  If 7pm- 7am, please page neurology on call as listed in AMION. 04/29/2017  12:11 AM

## 2017-04-29 NOTE — Progress Notes (Signed)
eLink Physician-Brief Progress Note Patient Name: Nathan [name] DOB: August 29, 1945 MRN: [id number]   Date of Service  04/29/2017  HPI/Events of Note  Notified by bedside nurse of significant improvement in serum glucose control. Patient's echo noted to be abnormal with EF 30-35 percent. Currently on normal saline at 75 mL per hour. Currently has normal renal function. Currently on insulin drip at 3.6 units per hour with serum glucose in the 130s previously. Subcutaneous medication administration reviewed for insulin.   eICU Interventions  1. Changing normal saline to 10 mL per hour KVO 2. Switching to every 4 hours Accu-Cheks 3. Switching to sliding-scale insulin per moderate how rhythm 4. Discontinuing insulin drip 5. Continuing to hold oral anti-hyperglycemics 6. M.D. notification parameters in place for serum glucose      Intervention Category Major Interventions: Hyperglycemia - active titration of insulin therapy  Nathan [name] 04/29/2017, 10:50 PM

## 2017-04-29 NOTE — Progress Notes (Signed)
[place] [facility] AM Rounding (see H&P from 650 am)  S: RN reports some movement on L side   O: Blood pressure 119/66, pulse 95, temperature 97.6 F (36.4 C), temperature source Oral, resp. rate 14, SpO2 100 %.  General:  Obese adult male in NAD HEENT: MM pink/moist, nasal trumpet in place PSY: calm/appropriate Neuro: AAOx4, wean on LUE but follows commands, R WNL CV: s1s2 rrr, no m/r/g PULM: even/non-labored, lungs bilaterally clear  ZO:XWRUGI:soft, non-tender, bsx4 active  Extremities: warm/dry, BUE edema 1+ Skin: no rashes or lesions  CBC    Component Value Date/Time   WBC 14.3 (H) 04/29/2017 0112   RBC 4.35 04/29/2017 0112   HGB 13.8 04/29/2017 0112   HCT 39.6 04/29/2017 0112   PLT 205 04/29/2017 0112   MCV 91.0 04/29/2017 0112   MCH 31.7 04/29/2017 0112   MCHC 34.8 04/29/2017 0112   RDW 12.1 04/29/2017 0112   BMP Latest Ref Rng & Units 04/29/2017  Glucose 65 - 99 mg/dL 045(W273(H)  BUN 6 - 20 mg/dL 16  Creatinine 0.980.61 - 1.191.24 mg/dL 1.470.89  Sodium 829135 - 562145 mmol/L 133(L)  Potassium 3.5 - 5.1 mmol/L 4.4  Chloride 101 - 111 mmol/L 103  CO2 22 - 32 mmol/L 23  Calcium 8.9 - 10.3 mg/dL 1.3(Y8.8(L)   A: Right M2 Branch Occlusion At Risk Airway Compromise  ? Undiagnosed Sleep Apnea Cardiomyopathy  Chronic Diastolic CHF  AF DM 2  P: Plan per Neurology  Monitor airway exam  Aspiration precautions Follow up am CXR, CBC, BMP Cleviprex for SBP goal  Follow up imaging per Neuro  NS @ 875ml/hr SSI  Hold home coreg, lisinopril, glipizide, repaglinide, zocor  Canary BrimBrandi Ollis, NP-C [facility] Pgr: [phone] or if no answer [phone] 04/29/2017, 10:16 AM  -------------------------- STAFF NOTE ?I, Caryl ComesSiva P Geralynn Capri, MD have personally reviewed patient's chart data, including medical history, events of note, physical examination and test results as part of my evaluation. I have discussed with NP and other care providers involved this patient's care. I personally evaluated  patient. Please see my comments as follows.   S/p tpA and mechanical thrombectomy by IR for M2 MCA occlusion> left hemiplegia (04-28-2017)  in this 71 y/o male with h/o CHF (systolic+diastolic), DM, HLD.  Starting to move LUE and LLE. On clevidipine for BP control.  Had apnea after extubation ? Post drug effect, ?OSA ?CSA  Has nasal trumpet.  No hypercapnia on etC02 monitor. Alert, awake. Pupils equal Chest clear No murmur/gallop abd-soft Groins- no ecchymosis  cxr- clear  CT reports reviewed  Ekg-sinus, no acute changes  Labs reviewed  Acute R MCA infarction with large ischemic penumbra s/p tPA & mechanical thrombectomy- clinical improvement noted already. CHF,HLD.   Further Neuro imaging and stroke Rx per Neurology team Monitor for apnea. May use cpap as needed at night/sleep. Consider outpt sleep study?Rx if sx suggestive. Control BP. Monitor airway.  Rest per NP whose note is outlined above and that I agree with.  PCCM signing off;  Please reconsult for  f/u again as needed.     Thank you for letting me participate in the care of your patient. The patient is critically ill with multiple organ systems failure and requires high complexity decision making for assessment and support, frequent evaluation and titration of therapies, application of advanced monitoring technologies and extensive interpretation of multiple databases like review of chart, labs, imaging, coordinating care with other physicians and healthcare team members.. ? Critical Care Time devoted  to patient care services described in this note is 45 Minutes.?This time reflects time of care of this signee. This critical care time does not reflect procedure time, or teaching time or supervisory time of NP, but could involve care discussion time. Note subject to typographical and grammatical errors; Any formal questions or concerns about the content, text, or information contained within the body of this  dictation should be directly addressed to the physician for clarification.    Caryl Comes, MD  Pulmonary and Critical Care Medicine  [facility] Pager: [phone] ---------------------------

## 2017-04-29 NOTE — Progress Notes (Signed)
Inpatient Diabetes Program Recommendations  AACE/ADA: New Consensus Statement on Inpatient Glycemic Control (2015)  Target Ranges:  Prepandial:   less than 140 mg/dL      Peak postprandial:   less than 180 mg/dL (1-2 hours)      Critically ill patients:  140 - 180 mg/dL   Lab Results  Component Value Date   GLUCAP 359 (H) 04/29/2017   HGBA1C 8.4 (H) 04/29/2017    Review of Glycemic Control Results for Nathan [name], Nathan [name] (MRN [id number]) as of 04/29/2017 12:15  Ref. Range 04/29/2017 04:04 04/29/2017 08:07 04/29/2017 09:22  Glucose-Capillary Latest Ref Range: 65 - 99 mg/dL 578273 (H) 469311 (H) 629359 (H)   Diabetes history: DM2 Outpatient Diabetes medications: Glucotrol 10 mg qd + Prandin 0.5 mg tid Current orders for Inpatient glycemic control: Novolog correction 0-20 units q 4 hrs.  Inpatient Diabetes Program Recommendations:    Noted hyperglycemia. Please consider IV insulin drip per glucostabilizer and transition to Lantus insulin weight based when weight available. Spoke with RN Gari CrownAshley Elliott to discuss hyperglycemia. Plans to recheck CBG and report to MD.  Thank you, Billy FischerJudy E. Rylinn Linzy, RN, MSN, CDE  Diabetes Coordinator Inpatient Glycemic Control Team Team Pager [phone] (8am-5pm) 04/29/2017 12:18 PM

## 2017-04-29 NOTE — Progress Notes (Signed)
NeuroInterventional Radiology Pre-Procedure Note  History: 71 yo male [facility] acute stroke.   Patient has acute left sided weakness started worsening overnight last night.  His wife saw him normal at 6:30am this morning, then he proceeded to have several falls with acute worsening of motor and speech.    CTA shows a right M2 branch occlusion, with maintained ASPECTS score.   Baseline mRS: .0 NIHSS:  . 13  CT ASPECTS: 10 CTA:   .Right M2 branch occlusion CTP:   No core infarct by RAPID calculation.  There is significant brain at risk with 63cc penumbra calculated by RAPID.  I have examined the patient in the VIR suite before consent was obtained, and he has no improvement of symptoms.   Given the patient's symptoms, imaging findings, baseline function, I believe they are an appropriate candidate for mechanical thrombectomy.    Have discussed the case with Dr. Amada JupiterKirkpatrick [facility].   The risks and benefits of the procedure were discussed with the patient and the patient's wife, with specific risks including: bleeding, infection, arterial injury/dissection, contrast reaction, kidney injury, need for further procedure/surgery, neurologic deficit, 10-15% risk of intracranial hemorrhage, cardiopulmonary collapse, death. All questions were answered.  The patient/family would like to proceed with attempt at thrombectomy.   Given the sizeable penumbra and ongoing symptoms we will plan for cerebral angiogram and attempt at mechanical thrombectomy.   Anesthesia team is present for assistance.   Signed,  Yvone NeuJaime S. Loreta AveWagner, DO

## 2017-04-29 NOTE — Progress Notes (Signed)
STROKE TEAM PROGRESS NOTE   SUBJECTIVE (INTERVAL HISTORY) Patient found laying in bed in NAD Nurse reports brief apnec period overnight  OBJECTIVE  Recent Labs Lab 04/29/17 1234 04/29/17 1426 04/29/17 1526 04/29/17 1620 04/29/17 1718  GLUCAP 328* 315* 300* 272* 251*    Recent Labs Lab 04/29/17 0112  NA 133*  K 4.4  CL 103  CO2 23  GLUCOSE 273*  BUN 16  CREATININE 0.89  CALCIUM 8.8*    Recent Labs Lab 04/29/17 0112  AST 22  ALT 22  ALKPHOS 92  BILITOT 0.9  PROT 7.2  ALBUMIN 3.7    Recent Labs Lab 04/29/17 0112  WBC 14.3*  HGB 13.8  HCT 39.6  MCV 91.0  PLT 205   No results for input(s): CKTOTAL, CKMB, CKMBINDEX, TROPONINI in the last 168 hours. No results for input(s): LABPROT, INR in the last 72 hours. No results for input(s): COLORURINE, LABSPEC, PHURINE, GLUCOSEU, HGBUR, BILIRUBINUR, KETONESUR, PROTEINUR, UROBILINOGEN, NITRITE, LEUKOCYTESUR in the last 72 hours.  Invalid input(s): APPERANCEUR     Component Value Date/Time   CHOL 102 04/29/2017 0451   TRIG 104 04/29/2017 0451   HDL 30 (L) 04/29/2017 0451   CHOLHDL 3.4 04/29/2017 0451   VLDL 21 04/29/2017 0451   LDLCALC 51 04/29/2017 0451   Lab Results  Component Value Date   HGBA1C 8.4 (H) 04/29/2017   No results found for: LABOPIA, COCAINSCRNUR, LABBENZ, AMPHETMU, THCU, LABBARB  No results for input(s): ETH in the last 168 hours.  IMAGING: I have personally reviewed the radiological images below and agree with the radiology interpretations.  Ct Head Wo Contrast  Result Date: 04/29/2017 CLINICAL DATA:  Right MCA stroke with percutaneous intervention. EXAM: CT HEAD WITHOUT CONTRAST TECHNIQUE: Contiguous axial images were obtained from the base of the skull through the vertex without intravenous contrast. COMPARISON:  Cerebral angiogram 04/29/2017 Head CT 04/29/2017 FINDINGS: Brain: There is no intracranial hemorrhage. Gray-white differentiation is preserved. Basal ganglia and insular ribbons  are preserved. Vascular: No hyperdense vessel or unexpected calcification.There is mild intravascular enhancement due to presence of circulating contrast. Skull: Normal visualized skull base, calvarium and extracranial soft tissues. Sinuses/Orbits: No sinus fluid levels or advanced mucosal thickening. No mastoid effusion. Normal orbits. IMPRESSION: No intracranial hemorrhage or cytotoxic edema following vascular intervention. Electronically Signed   By: Deatra Robinson M.D.   On: 04/29/2017 04:05   Ir US Guide Vasc Access Left  Result Date: 04/29/2017 INDICATION: 71 year old male with a history of acute right MCA stroke, left-sided symptoms, normal baseline, with stroke scale of 13, aspects 10, within the window for tPA. He presents for angiogram and mechanical thrombectomy as a treatment for emergent large vessel occlusion. EXAM: ULTRASOUND GUIDED ACCESS RIGHT COMMON FEMORAL ARTERY CEREBRAL ANGIOGRAM MECHANICAL THROMBECTOMY RIGHT MCA BRANCHES CLOSURE OF RIGHT COMMON FEMORAL ARTERY ACCESS WITH SUTURE MEDIATED DEVICE COMPARISON:  CT IMAGING OF THE SAME DAY MEDICATIONS: 2.0 g Ancef. The antibiotic was administered within 1 hour of the procedure ANESTHESIA/SEDATION: General anesthesia with anesthesia team CONTRAST:  75 cc Isovue 300 FLUOROSCOPY TIME:  Fluoroscopy Time: 8 minutes 24 seconds (707 mGy). COMPLICATIONS: None TECHNIQUE: Informed written consent was obtained from the patient's family after a thorough discussion of the procedural risks, benefits and alternatives. Specific risks discussed include: Bleeding, infection, contrast reaction, kidney injury/failure, need for further procedure/surgery, arterial injury or dissection, embolization to new territory, intracranial hemorrhage (10-15% risk), neurologic deterioration, cardiopulmonary collapse, death. All questions were addressed. Maximal Sterile Barrier Technique was utilized including during the procedure including  caps, mask, sterile gowns, sterile gloves,  sterile drape, hand hygiene and skin antiseptic. A timeout was performed prior to the initiation of the procedure. FINDINGS: Initial: Right common carotid artery:  Normal course caliber and contour. Right external carotid artery: Patent with antegrade flow. Right internal carotid artery: Mild atherosclerotic changes with plaque at the carotid bifurcation. No significant stenosis at the bifurcation. Normal course caliber and contour of the cervical portion. Vertical and petrous segment patent with normal course caliber contour. Cavernous segment patent. Clinoid segment patent. Antegrade flow of the ophthalmic artery. Ophthalmic segment patent. Terminus patent. Right MCA: M1 segment is patent. There is an early arising temporal branch, with downward course. Inferior division patent on the initial image. There is proximal occlusion of the superior and dominant segment. Right ACA: A 1 segment patent. A 2 segment perfuses the right territory. Final: Right MCA: Restoration of flow through the superior and dominant division, TICI 3 PROCEDURE: Patient was brought to the interventional suite, and identification of the patient was confirmed. Patient was placed supine position with gentle anesthesia initiated with the anesthesia team. The patient is then prepped and draped in the usual sterile fashion. Ultrasound survey of the right inguinal region was performed with images stored and sent to PACs. A micropuncture needle was used access the right common femoral artery under ultrasound. With excellent arterial blood flow returned, and an .018 micro wire was passed through the needle, observed enter the abdominal aorta under fluoroscopy. The needle was removed, and a micropuncture sheath was placed over the wire. The inner dilator and wire were removed, and an 035 Bentson wire was advanced under fluoroscopy into the abdominal aorta. The sheath was removed and a standard 5 [place] vascular sheath was placed. The dilator was  removed and the sheath was flushed. Ultrasound survey of the left inguinal region was then performed with images stored and sent to PACs. A micropuncture needle was used access the left common femoral artery under ultrasound. With excellent arterial blood flow returned, an .018 micro wire was passed through the needle, observed to enter the abdominal aorta under fluoroscopy. The needle was removed, and a micropuncture sheath was placed over the wire. The inner dilator and wire were removed, and an 035 Bentson wire was advanced under fluoroscopy into the abdominal aorta. The sheath was removed and a standard 4 [place] vascular sheath was placed for arterial monitoring. Dilator was removed and the sheath was flushed and attached to the transducer. A 52F JB-1 diagnostic catheter was advanced over the wire to the proximal descending thoracic aorta. Wire was then removed. Double flush of the catheter was performed. Catheter was then used to select the innominate artery. Angiogram was performed. Using roadmap technique, the catheter was advanced over a roadrunner wire into right common carotid artery. Formal angiogram was performed. Exchange length Rosen wire was then passed through the diagnostic catheter to the distal common carotid artery and the diagnostic catheter was removed. The 5 French sheath was removed and exchanged for 8 French 55 centimeter BrightTip sheath. Sheath was flushed and attached to pressurized and heparinized saline bag for constant forward flow. Then an 8 [place], 85 cm Flowgate balloon tip catheter was prepared on the back table with inflation of the balloon with 50/50 concentration of dilute contrast. The balloon catheter was then advanced over the wire, positioned into the distal common carotid artery. Copious back flush was performed and the balloon catheter was attached to heparinized and pressurized saline bag for forward flow. Flowgate balloon  catheter was then advanced over the road runner  wire into the mid internal carotid artery. Angiogram was performed for road map guide. Microcatheter system was then introduced through the balloon guide catheter, using a synchro soft 014 wire and a Trevo Provue18 catheter. Microcatheter system was advanced into the internal carotid artery, to the level of the occlusion. The micro wire was then carefully advanced through the occluded segment. Microcatheter was then push through the occluded segment and the wire was removed. Blood was then aspirated through the hub of the microcatheter, and a gentle contrast injection was performed confirming intraluminal position. A rotating hemostatic valve was then attached to the back end of the microcatheter, and a pressurized and heparinized saline bag was attached to the catheter. 4 x 40 solitaire device was then selected. Back flush was achieved at the rotating hemostatic valve, and then the device was gently advanced through the microcatheter to the distal end. The retriever was then unsheathed by withdrawing the microcatheter under fluoroscopy. Once the retriever was completely unsheathed, control angiogram was performed from the balloon catheter. The balloon at the balloon tip catheter was then inflated under fluoroscopy for proximal flow arrest. Constant aspiration was then performed at the tip of the balloon guide catheter as the retriever was gently and slowly withdrawn with fluoroscopic observation. Once the retriever was entirely removed from the system, free aspiration was confirmed at the hub of the balloon guide catheter, with free blood return confirmed. The balloon was then deflated, rotating hemostatic valve was reattached, and a control angiogram was performed. Control angiogram was performed at the right common femoral artery puncture site. Two overlapping suture mediated devices were used for right common femoral artery closure. Patient tolerated the procedure well and remained hemodynamically stable  throughout. No complications were encountered and no significant blood loss encountered. IMPRESSION: Status post cerebral angiogram and mechanical thrombectomy of proximal occlusion of the superior and dominant MCA branch of the right hemisphere with restoration of TICI 3 flow. Suture mediated closure of right common femoral artery. Signed, Yvone Neu. Loreta Ave, DO [facility] PLAN: Extubated in the interventional suite CT post procedure ICU admission Right leg straight until 8 a.m. Electronically Signed   By: Gilmer Mor D.O.   On: 04/29/2017 04:07   Ir US Guide Vasc Access Right  Result Date: 04/29/2017 INDICATION: 71 year old male with a history of acute right MCA stroke, left-sided symptoms, normal baseline, with stroke scale of 13, aspects 10, within the window for tPA. He presents for angiogram and mechanical thrombectomy as a treatment for emergent large vessel occlusion. EXAM: ULTRASOUND GUIDED ACCESS RIGHT COMMON FEMORAL ARTERY CEREBRAL ANGIOGRAM MECHANICAL THROMBECTOMY RIGHT MCA BRANCHES CLOSURE OF RIGHT COMMON FEMORAL ARTERY ACCESS WITH SUTURE MEDIATED DEVICE COMPARISON:  CT IMAGING OF THE SAME DAY MEDICATIONS: 2.0 g Ancef. The antibiotic was administered within 1 hour of the procedure ANESTHESIA/SEDATION: General anesthesia with anesthesia team CONTRAST:  75 cc Isovue 300 FLUOROSCOPY TIME:  Fluoroscopy Time: 8 minutes 24 seconds (707 mGy). COMPLICATIONS: None TECHNIQUE: Informed written consent was obtained from the patient's family after a thorough discussion of the procedural risks, benefits and alternatives. Specific risks discussed include: Bleeding, infection, contrast reaction, kidney injury/failure, need for further procedure/surgery, arterial injury or dissection, embolization to new territory, intracranial hemorrhage (10-15% risk), neurologic deterioration, cardiopulmonary collapse, death. All questions were addressed. Maximal Sterile  Barrier Technique was utilized including during the procedure including caps, mask, sterile gowns, sterile gloves, sterile drape, hand hygiene and skin antiseptic.  A timeout was performed prior to the initiation of the procedure. FINDINGS: Initial: Right common carotid artery:  Normal course caliber and contour. Right external carotid artery: Patent with antegrade flow. Right internal carotid artery: Mild atherosclerotic changes with plaque at the carotid bifurcation. No significant stenosis at the bifurcation. Normal course caliber and contour of the cervical portion. Vertical and petrous segment patent with normal course caliber contour. Cavernous segment patent. Clinoid segment patent. Antegrade flow of the ophthalmic artery. Ophthalmic segment patent. Terminus patent. Right MCA: M1 segment is patent. There is an early arising temporal branch, with downward course. Inferior division patent on the initial image. There is proximal occlusion of the superior and dominant segment. Right ACA: A 1 segment patent. A 2 segment perfuses the right territory. Final: Right MCA: Restoration of flow through the superior and dominant division, TICI 3 PROCEDURE: Patient was brought to the interventional suite, and identification of the patient was confirmed. Patient was placed supine position with gentle anesthesia initiated with the anesthesia team. The patient is then prepped and draped in the usual sterile fashion. Ultrasound survey of the right inguinal region was performed with images stored and sent to PACs. A micropuncture needle was used access the right common femoral artery under ultrasound. With excellent arterial blood flow returned, and an .018 micro wire was passed through the needle, observed enter the abdominal aorta under fluoroscopy. The needle was removed, and a micropuncture sheath was placed over the wire. The inner dilator and wire were removed, and an 035 Bentson wire was advanced under fluoroscopy into the  abdominal aorta. The sheath was removed and a standard 5 [place] vascular sheath was placed. The dilator was removed and the sheath was flushed. Ultrasound survey of the left inguinal region was then performed with images stored and sent to PACs. A micropuncture needle was used access the left common femoral artery under ultrasound. With excellent arterial blood flow returned, an .018 micro wire was passed through the needle, observed to enter the abdominal aorta under fluoroscopy. The needle was removed, and a micropuncture sheath was placed over the wire. The inner dilator and wire were removed, and an 035 Bentson wire was advanced under fluoroscopy into the abdominal aorta. The sheath was removed and a standard 4 [place] vascular sheath was placed for arterial monitoring. Dilator was removed and the sheath was flushed and attached to the transducer. A 55F JB-1 diagnostic catheter was advanced over the wire to the proximal descending thoracic aorta. Wire was then removed. Double flush of the catheter was performed. Catheter was then used to select the innominate artery. Angiogram was performed. Using roadmap technique, the catheter was advanced over a roadrunner wire into right common carotid artery. Formal angiogram was performed. Exchange length Rosen wire was then passed through the diagnostic catheter to the distal common carotid artery and the diagnostic catheter was removed. The 5 French sheath was removed and exchanged for 8 French 55 centimeter BrightTip sheath. Sheath was flushed and attached to pressurized and heparinized saline bag for constant forward flow. Then an 8 [place], 85 cm Flowgate balloon tip catheter was prepared on the back table with inflation of the balloon with 50/50 concentration of dilute contrast. The balloon catheter was then advanced over the wire, positioned into the distal common carotid artery. Copious back flush was performed and the balloon catheter was attached to heparinized and  pressurized saline bag for forward flow. Flowgate balloon catheter was then advanced over the road runner wire into the mid internal  carotid artery. Angiogram was performed for road map guide. Microcatheter system was then introduced through the balloon guide catheter, using a synchro soft 014 wire and a Trevo Provue18 catheter. Microcatheter system was advanced into the internal carotid artery, to the level of the occlusion. The micro wire was then carefully advanced through the occluded segment. Microcatheter was then push through the occluded segment and the wire was removed. Blood was then aspirated through the hub of the microcatheter, and a gentle contrast injection was performed confirming intraluminal position. A rotating hemostatic valve was then attached to the back end of the microcatheter, and a pressurized and heparinized saline bag was attached to the catheter. 4 x 40 solitaire device was then selected. Back flush was achieved at the rotating hemostatic valve, and then the device was gently advanced through the microcatheter to the distal end. The retriever was then unsheathed by withdrawing the microcatheter under fluoroscopy. Once the retriever was completely unsheathed, control angiogram was performed from the balloon catheter. The balloon at the balloon tip catheter was then inflated under fluoroscopy for proximal flow arrest. Constant aspiration was then performed at the tip of the balloon guide catheter as the retriever was gently and slowly withdrawn with fluoroscopic observation. Once the retriever was entirely removed from the system, free aspiration was confirmed at the hub of the balloon guide catheter, with free blood return confirmed. The balloon was then deflated, rotating hemostatic valve was reattached, and a control angiogram was performed. Control angiogram was performed at the right common femoral artery puncture site. Two overlapping suture mediated devices were used for right  common femoral artery closure. Patient tolerated the procedure well and remained hemodynamically stable throughout. No complications were encountered and no significant blood loss encountered. IMPRESSION: Status post cerebral angiogram and mechanical thrombectomy of proximal occlusion of the superior and dominant MCA branch of the right hemisphere with restoration of TICI 3 flow. Suture mediated closure of right common femoral artery. Signed, Yvone Neu. Loreta Ave, DO [facility] PLAN: Extubated in the interventional suite CT post procedure ICU admission Right leg straight until 8 a.m. Electronically Signed   By: Gilmer Mor D.O.   On: 04/29/2017 04:07   Ct Cerebral Perfusion W Contrast  Result Date: 04/29/2017 CLINICAL DATA:  Code stroke. EXAM: CT HEAD WITHOUT CONTRAST CT CEREBRAL PERFUSION WITH CONTRAST TECHNIQUE: Contiguous axial images were obtained from the base of the skull through the vertex without intravenous contrast. Following the administration of contrast material, CT perfusion scanning of the brain was performed. COMPARISON:  None. FINDINGS: Brain: No mass lesion or acute hemorrhage. No focal hypoattenuation of the basal ganglia or cortex to indicate infarcted tissue. There is periventricular hypoattenuation compatible with chronic microvascular disease. No hydrocephalus or age advanced atrophy. Vascular: No hyperdense vessel. No advanced atherosclerotic calcification of the arteries at the skull base. Skull: Normal visualized skull base, calvarium and extracranial soft tissues. Sinuses/Orbits: No sinus fluid levels or advanced mucosal thickening. No mastoid effusion. Normal orbits. ASPECTS ([place] Stroke Program Early CT Score) - Ganglionic level infarction (caudate, lentiform nuclei, internal capsule, insula, M1-M3 cortex): 7 - Supraganglionic infarction (M4-M6 cortex): 3 Total score (0-10 with 10 being normal): 10 CT Brain Perfusion  Findings: CBF (<30%) Volume: 0mL Perfusion (Tmax>6.0s) volume: 63mL Mismatch Volume: 63mL Ischemia Location:Anterior right MCA distribution IMPRESSION: 1. No acute hemorrhage or mass lesion. 2. ASPECTS is 10. 3. Elevated mean transit time within the anterior right MCA distribution without associated abnormal cerebral blood volume or cerebral  blood flow likely indicates an area of acute ischemia without infarction. These results were called by telephone at the time of interpretation on 04/29/2017 at 12:55 am to Dr. Ritta Slot , who verbally acknowledged these results. Electronically Signed   By: Deatra Robinson M.D.   On: 04/29/2017 01:16   Dg Chest Port 1 View  Result Date: 04/29/2017 CLINICAL DATA:  Acute respiratory failure.  Stroke. EXAM: PORTABLE CHEST 1 VIEW COMPARISON:  Chest yesterday radiograph at [place]. FINDINGS: Cardiomegaly accentuated by lower lung volumes. Crowding of bronchovascular structures with mild vascular congestion. Minimal retrocardiac atelectasis. No pleural fluid or pneumothorax. IMPRESSION: Cardiomegaly with mild vascular congestion. Electronically Signed   By: Rubye Oaks M.D.   On: 04/29/2017 05:32   Ir Percutaneous Art Thrombectomy/infusion Intracranial Inc Diag Angio  Result Date: 04/29/2017 INDICATION: 71 year old male with a history of acute right MCA stroke, left-sided symptoms, normal baseline, with stroke scale of 13, aspects 10, within the window for tPA. He presents for angiogram and mechanical thrombectomy as a treatment for emergent large vessel occlusion. EXAM: ULTRASOUND GUIDED ACCESS RIGHT COMMON FEMORAL ARTERY CEREBRAL ANGIOGRAM MECHANICAL THROMBECTOMY RIGHT MCA BRANCHES CLOSURE OF RIGHT COMMON FEMORAL ARTERY ACCESS WITH SUTURE MEDIATED DEVICE COMPARISON:  CT IMAGING OF THE SAME DAY MEDICATIONS: 2.0 g Ancef. The antibiotic was administered within 1 hour of the procedure ANESTHESIA/SEDATION: General anesthesia with anesthesia team CONTRAST:  75  cc Isovue 300 FLUOROSCOPY TIME:  Fluoroscopy Time: 8 minutes 24 seconds (707 mGy). COMPLICATIONS: None TECHNIQUE: Informed written consent was obtained from the patient's family after a thorough discussion of the procedural risks, benefits and alternatives. Specific risks discussed include: Bleeding, infection, contrast reaction, kidney injury/failure, need for further procedure/surgery, arterial injury or dissection, embolization to new territory, intracranial hemorrhage (10-15% risk), neurologic deterioration, cardiopulmonary collapse, death. All questions were addressed. Maximal Sterile Barrier Technique was utilized including during the procedure including caps, mask, sterile gowns, sterile gloves, sterile drape, hand hygiene and skin antiseptic. A timeout was performed prior to the initiation of the procedure. FINDINGS: Initial: Right common carotid artery:  Normal course caliber and contour. Right external carotid artery: Patent with antegrade flow. Right internal carotid artery: Mild atherosclerotic changes with plaque at the carotid bifurcation. No significant stenosis at the bifurcation. Normal course caliber and contour of the cervical portion. Vertical and petrous segment patent with normal course caliber contour. Cavernous segment patent. Clinoid segment patent. Antegrade flow of the ophthalmic artery. Ophthalmic segment patent. Terminus patent. Right MCA: M1 segment is patent. There is an early arising temporal branch, with downward course. Inferior division patent on the initial image. There is proximal occlusion of the superior and dominant segment. Right ACA: A 1 segment patent. A 2 segment perfuses the right territory. Final: Right MCA: Restoration of flow through the superior and dominant division, TICI 3 PROCEDURE: Patient was brought to the interventional suite, and identification of the patient was confirmed. Patient was placed supine position with gentle anesthesia initiated with the anesthesia  team. The patient is then prepped and draped in the usual sterile fashion. Ultrasound survey of the right inguinal region was performed with images stored and sent to PACs. A micropuncture needle was used access the right common femoral artery under ultrasound. With excellent arterial blood flow returned, and an .018 micro wire was passed through the needle, observed enter the abdominal aorta under fluoroscopy. The needle was removed, and a micropuncture sheath was placed over the wire. The inner dilator and wire were removed, and an 035 Bentson wire  was advanced under fluoroscopy into the abdominal aorta. The sheath was removed and a standard 5 [place] vascular sheath was placed. The dilator was removed and the sheath was flushed. Ultrasound survey of the left inguinal region was then performed with images stored and sent to PACs. A micropuncture needle was used access the left common femoral artery under ultrasound. With excellent arterial blood flow returned, an .018 micro wire was passed through the needle, observed to enter the abdominal aorta under fluoroscopy. The needle was removed, and a micropuncture sheath was placed over the wire. The inner dilator and wire were removed, and an 035 Bentson wire was advanced under fluoroscopy into the abdominal aorta. The sheath was removed and a standard 4 [place] vascular sheath was placed for arterial monitoring. Dilator was removed and the sheath was flushed and attached to the transducer. A 1F JB-1 diagnostic catheter was advanced over the wire to the proximal descending thoracic aorta. Wire was then removed. Double flush of the catheter was performed. Catheter was then used to select the innominate artery. Angiogram was performed. Using roadmap technique, the catheter was advanced over a roadrunner wire into right common carotid artery. Formal angiogram was performed. Exchange length Rosen wire was then passed through the diagnostic catheter to the distal common  carotid artery and the diagnostic catheter was removed. The 5 French sheath was removed and exchanged for 8 French 55 centimeter BrightTip sheath. Sheath was flushed and attached to pressurized and heparinized saline bag for constant forward flow. Then an 8 [place], 85 cm Flowgate balloon tip catheter was prepared on the back table with inflation of the balloon with 50/50 concentration of dilute contrast. The balloon catheter was then advanced over the wire, positioned into the distal common carotid artery. Copious back flush was performed and the balloon catheter was attached to heparinized and pressurized saline bag for forward flow. Flowgate balloon catheter was then advanced over the road runner wire into the mid internal carotid artery. Angiogram was performed for road map guide. Microcatheter system was then introduced through the balloon guide catheter, using a synchro soft 014 wire and a Trevo Provue18 catheter. Microcatheter system was advanced into the internal carotid artery, to the level of the occlusion. The micro wire was then carefully advanced through the occluded segment. Microcatheter was then push through the occluded segment and the wire was removed. Blood was then aspirated through the hub of the microcatheter, and a gentle contrast injection was performed confirming intraluminal position. A rotating hemostatic valve was then attached to the back end of the microcatheter, and a pressurized and heparinized saline bag was attached to the catheter. 4 x 40 solitaire device was then selected. Back flush was achieved at the rotating hemostatic valve, and then the device was gently advanced through the microcatheter to the distal end. The retriever was then unsheathed by withdrawing the microcatheter under fluoroscopy. Once the retriever was completely unsheathed, control angiogram was performed from the balloon catheter. The balloon at the balloon tip catheter was then inflated under fluoroscopy for  proximal flow arrest. Constant aspiration was then performed at the tip of the balloon guide catheter as the retriever was gently and slowly withdrawn with fluoroscopic observation. Once the retriever was entirely removed from the system, free aspiration was confirmed at the hub of the balloon guide catheter, with free blood return confirmed. The balloon was then deflated, rotating hemostatic valve was reattached, and a control angiogram was performed. Control angiogram was performed at the right common femoral artery  puncture site. Two overlapping suture mediated devices were used for right common femoral artery closure. Patient tolerated the procedure well and remained hemodynamically stable throughout. No complications were encountered and no significant blood loss encountered. IMPRESSION: Status post cerebral angiogram and mechanical thrombectomy of proximal occlusion of the superior and dominant MCA branch of the right hemisphere with restoration of TICI 3 flow. Suture mediated closure of right common femoral artery. Signed, Yvone Neu. Loreta Ave, DO [facility] Radiology PLAN: Extubated in the interventional suite CT post procedure ICU admission Right leg straight until 8 a.m. Electronically Signed   By: Gilmer Mor D.O.   On: 04/29/2017 04:07   Ct Head Code Stroke Wo Contrast  Result Date: 04/29/2017 CLINICAL DATA:  Code stroke. EXAM: CT HEAD WITHOUT CONTRAST CT CEREBRAL PERFUSION WITH CONTRAST TECHNIQUE: Contiguous axial images were obtained from the base of the skull through the vertex without intravenous contrast. Following the administration of contrast material, CT perfusion scanning of the brain was performed. COMPARISON:  None. FINDINGS: Brain: No mass lesion or acute hemorrhage. No focal hypoattenuation of the basal ganglia or cortex to indicate infarcted tissue. There is periventricular hypoattenuation compatible with chronic microvascular disease. No  hydrocephalus or age advanced atrophy. Vascular: No hyperdense vessel. No advanced atherosclerotic calcification of the arteries at the skull base. Skull: Normal visualized skull base, calvarium and extracranial soft tissues. Sinuses/Orbits: No sinus fluid levels or advanced mucosal thickening. No mastoid effusion. Normal orbits. ASPECTS (Alberta Stroke Program Early CT Score) - Ganglionic level infarction (caudate, lentiform nuclei, internal capsule, insula, M1-M3 cortex): 7 - Supraganglionic infarction (M4-M6 cortex): 3 Total score (0-10 with 10 being normal): 10 CT Brain Perfusion Findings: CBF (<30%) Volume: 0mL Perfusion (Tmax>6.0s) volume: 63mL Mismatch Volume: 63mL Ischemia Location:Anterior right MCA distribution IMPRESSION: 1. No acute hemorrhage or mass lesion. 2. ASPECTS is 10. 3. Elevated mean transit time within the anterior right MCA distribution without associated abnormal cerebral blood volume or cerebral blood flow likely indicates an area of acute ischemia without infarction. These results were called by telephone at the time of interpretation on 04/29/2017 at 12:55 am to Dr. Ritta Slot , who verbally acknowledged these results. Electronically Signed   By: Deatra Robinson M.D.   On: 04/29/2017 01:16    PHYSICAL EXAM Temp:  [97.6 F (36.4 C)-98.2 F (36.8 C)] 98.2 F (36.8 C) (11/02 1721) Pulse Rate:  [74-102] 95 (11/02 1725) Resp:  [10-28] 21 (11/02 1725) BP: (92-173)/(46-107) 111/60 (11/02 1725) SpO2:  [93 %-100 %] 99 % (11/02 1725) Weight:  [112.7 kg (248 lb 7.3 oz)] 112.7 kg (248 lb 7.3 oz) (11/02 1200)  General - Well nourished, well developed, in no apparent distress Respiratory - Unlabored breathing noted on exam Cardiovascular - Regular rate and rhythm   Neuro:  NIH this AM = 9 Mental Status: Patient is awake, alert, oriented to person, place, month, year, and situation. No signs of aphasia but patient does have dysarthric speech- likely secondary to intubation. He  has mild  left hemineglect Cranial Nerves: II: Left hemianopia and right gaze preference. pupils are equal, round, and reactive to light.  Left field cut III,IV, VI: Does not cross midline to the left V: Facial sensation is decreased on the left VII: Facial movement - Left facial droop VIII: hearing is intact to voice X: Uvula elevates symmetrically XI: Shoulder shrug is symmetric. XII: tongue is midline without atrophy or fasciculations.  Motor: He has normal strength in the right side, Left Arm was  3/5,   hand 2/5 and Left Leg 3/5. He is able to resist gravity some, able to hold his legs with only mild drift  sensory: Sensation is decreased on the left, he extinguishes to double simultaneous stimulation Cerebellar: No ataxia on the right  ASSESSMENT AND PLAN: Nathan [name] is a 71 y.o. male with PMH of cardiomyopathy with an EF of 30% who presents with left-sided weakness. He was taken to [facility] ER where he was given IV TPA.  He had a CTA showing a relatively distal M2 occlusion, with very mild symptoms.  He was accepted for transferred to [facility].  En route, he markedly worsened, after he arrived he was taken for a stat CT perfusion which demonstrates a large penumbra.IR was called and he was taken for IR intervention. The patient was extubated post procedure    Interventional Radiology: Status post cerebral angiogram and mechanical thrombectomy of proximal occlusion of the superior and dominant MCA branch of the right hemisphere with restoration of TICI 3 flow.  EEG:  04/29/2017     The EEG is abnormal and findings are suggestive of generalized and right fronto temporal focal cerebral dysfunction.Epileptiform activity was not seen during this recording                                    2D Echo:                     Impressions: - Mildly dilated LV with mild LV hypertrophy. EF 30-35%.   Inferolateral akinesis, anterolateral severe hypokinesis. Normal    RV size and systolic function. At least moderate, eccentric   posteriorly-directed mitral regurgitation. Wall motion   abnormalities raise concern for infarct-related MR.            Recommendations:   CT Head now,  Antiplatelet held for now s/p thrombectomy, will make decision to restart after Repeat Head CT.  MRI/MRA in AM  Continue Frequent NV checks, including foot pulses  Maintain Left CFA arterial line for now, likely will discontinue in AM if patient continues to improve.  SLP eval in AM to assess swallow  PT/OT eval in AM   CIR Consultation when appropriate  Other Stroke Risk Factors:  Advanced age  Obesity, Body mass index is 34.65 kg/m.   CHF  CARDIAC: PMHx of Cardiomyopathy Chronic diastolic heart failure  Last EF Afib  CHADS2VASC- 4 Not requiring vasopressors Hemodynamically stable  PULMONARY: Some periods of apnea reported, Critical Care following No urgent need to re-intubate at this time  Endocrine: Type 2 DM w/o h/o of home insulin Started on ICU Glycemic protocol Phas 1 with subcu insulin   Hospital day # 1   Brita Romp Stroke Neurology Team 04/29/2017 5:48 PM  I have personally examined this patient, reviewed notes, independently viewed imaging studies, participated in medical decision making and plan of care.ROS completed by me personally and pertinent positives fully documented  I have made any additions or clarifications directly to the above note. Agree with note above. He presented with left m2 occlusion and underwent mech thrombectomy with good recanalization but neurological exam yet shows significant left hemiplegia.Recommend strict BP control. Close neurologic monitoring, repeat brain imaging.DC groin arterial sheath and check swallow eval Continue ongoing stroke w/u. No family available at bedside for discussion. This patient is critically ill and at significant risk of neurological worsening,  death and care requires  constant monitoring of vital signs, hemodynamics,respiratory and cardiac monitoring, extensive review of multiple databases, frequent neurological assessment, discussion with family, other specialists and medical decision making of high complexity.I have made any additions or clarifications directly to the above note.This critical care time does not reflect procedure time, or teaching time or supervisory time of PA/NP/Med Resident etc but could involve care discussion time.  I spent 32 minutes of neurocritical care time  in the care of  this patient.     Delia Heady, MD Medical Director [facility] Pager: [phone] 04/29/2017 7:36 PM  To contact Stroke Continuity provider, please refer to [email or web address]. After hours, contact General Neurology

## 2017-04-29 NOTE — Anesthesia Preprocedure Evaluation (Addendum)
Anesthesia Evaluation  Patient identified by MRN, date of birth, ID band Patient confused    Reviewed: Allergy & PrecautionsPreop documentation limited or incomplete due to emergent nature of procedure.  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)   Rhythm:Regular Rate:Normal     Neuro/Psych CVA    GI/Hepatic   Endo/Other  diabetesObesity  Renal/GU      Musculoskeletal   Abdominal   Peds  Hematology   Anesthesia Other Findings   Reproductive/Obstetrics                            Anesthesia Physical Anesthesia Plan  ASA: IV and emergent  Anesthesia Plan: General   Post-op Pain Management:    Induction: Intravenous, Rapid sequence and Cricoid pressure planned  PONV Risk Score and Plan: 2 and Ondansetron, Dexamethasone and Treatment may vary due to age or medical condition  Airway Management Planned: Oral ETT  Additional Equipment: Arterial line  Intra-op Plan:   Post-operative Plan: Possible Post-op intubation/ventilation  Informed Consent:   Dental advisory given  Plan Discussed with: CRNA and Surgeon  Anesthesia Plan Comments:        Anesthesia Quick Evaluation

## 2017-04-29 NOTE — Progress Notes (Addendum)
Received Code Stroke IR Team Page, I went to assist staff with patient, groins were clipped, labs drawn, pulses marked (+ 2 DP bilateral and +2 PT bilateral), patient taken to IR, foley was placed in IR. NIH was 13 per nursing staff.   End Time 200

## 2017-04-29 NOTE — Transfer of Care (Signed)
Immediate Anesthesia Transfer of Care Note  Patient: Nathan [name]  Procedure(s) Performed: IR WITH ANESTHESIA (N/A )  Patient Location: ICU  Anesthesia Type:General  Level of Consciousness: drowsy, patient cooperative and responds to stimulation  Airway & Oxygen Therapy: Patient Spontanous Breathing and Patient connected to nasal cannula oxygen  Post-op Assessment: Report given to RN, Post -op Vital signs reviewed and stable and Patient moving all extremities X 4  Post vital signs: Reviewed and stable  Last Vitals:  Vitals:   04/29/17 0115 04/29/17 0130  BP: (!) 154/94 (!) 144/94  Pulse: 86 86  Resp: 17 19  SpO2: 100% 98%    Last Pain: There were no vitals filed for this visit.       Complications: No apparent anesthesia complications

## 2017-04-29 NOTE — Care Management Note (Signed)
Case Management Note  Patient Details  Name: Nathan [name] MRN: [id number] Date of Birth: Mar 03, 1946  Subjective/Objective:    Pt admitted on 04/29/17 with M2 occlusion with TPA given.  PTA, pt resided at home with spouse.                  Action/Plan: Will follow for discharge planning as pt progresses.  PT/OT pending; awaiting bedrest being lifted.    Expected Discharge Date:                  Expected Discharge Plan:     In-House Referral:     Discharge planning Services  CM Consult  Post Acute Care Choice:    Choice offered to:     DME Arranged:    DME Agency:     HH Arranged:    HH Agency:     Status of Service:  In process, will continue to follow  If discussed at Long Length of Stay Meetings, dates discussed:    Additional Comments:  Quintella BatonJulie W. Clariece Roesler, RN, BSN  Trauma/Neuro ICU Case Manager [phone]

## 2017-04-29 NOTE — Progress Notes (Signed)
PT Cancellation Note  Patient Details Name: Nathan [name] MRN: [id number] DOB: 08-21-1945   Cancelled Treatment:    Reason Eval/Treat Not Completed: Medical issues which prohibited therapy (pt on strict bedrest)   Larya Charpentier B Tyrez Berrios 04/29/2017, 7:13 AM  Delaney MeigsMaija Tabor Cynthia Cogle, PT [phone]

## 2017-04-29 NOTE — Progress Notes (Signed)
OT Cancellation Note  Patient Details Name: Nathan [name] MRN: [id number] DOB: Feb 29, 1946   Cancelled Treatment:    Reason Eval/Treat Not Completed: Patient not medically ready (strict bedrest orders).  Gaye AlkenBailey A Marlan Steward M.S., OTR/L Pager: [phone]  04/29/2017, 7:10 AM

## 2017-04-29 NOTE — Anesthesia Procedure Notes (Signed)
Arterial Line Insertion Start/End11/07/2016 1:55 AM, 04/29/2017 1:58 AM Performed by: Beryle LatheBROCK, Hutchinson Isenberg E, anesthesiologist  Patient location: OR. Preanesthetic checklist: patient identified, IV checked and monitors and equipment checked Emergency situation Left, radial was placed Catheter size: 20 Fr Hand hygiene performed  and maximum sterile barriers used   Attempts: 1 Procedure performed without using ultrasound guided technique. Following insertion, dressing applied and Biopatch. Post procedure assessment: normal and unchanged  Patient tolerated the procedure well with no immediate complications.

## 2017-04-29 NOTE — Procedures (Signed)
Neuro-Interventional Radiology Post Cerebral Angiogram Procedure Note  History: 71 yo [facility].   Patient has acute left sided weakness starting ~6:45pm last night.  His wife saw him normal at 6:30am 04/28/2017 in the morning. He experienced several falls in the day, with acute worsening of motor and speech at 6:45pm.   Baseline mRS:  0 NIHSS:   13 Last Known Well:  6:45pm 04/28/2017 ASPECTS:   10 Anesthesia    GETA Skin Puncture:   02:06 First Pass Date & Time: 02:34 IA tPA:    Yes - [place] [facility] [place] Medication:  No Proximal or Distal:  Prox M2 Post TICI Score:  TICI 3 Skin Puncture to Final: 28 min Device:   4x40 solitaire  Procedure:     US guided access of the right CFA.  Closure of the access at completion of case with 2 suture mediated closure devices US guided access of the left CFA for placement of arterial line. -- the arterial line will remain for ICU care.  May be removed by the ICU staff with manual pressure for hemostasis, or call IR control room with questions.  [id number] Cerebral Angiogram with mechanical thrombectomy of M2 branch   Findings:  Mild atherosclerotic changes of the right CFA. Angiogram confirmed occlusion of superior division branch to the parietal region. TICI 3 flow restored with 1 pass, and filling of parietal branches  Complications: None  Recommendations: - Extubated in IR - pressure dressing on the right CFA access.  Right leg straight overnight until 8am given tPA dose - Frequent NV checks, including foot pulses - CT now - To ICU  - Maintain left CFA arterial line for ICU.  May be removed when no longer needed by ICU staff with manual pressure after 8am 04/29/2017, or call VIR control room with questions [phone]  Signed,  Yvone NeuJaime S. Loreta AveWagner, DO

## 2017-04-29 NOTE — Consult Note (Signed)
.. ..  Name: Nathan [name] MRN: [id number] DOB: 1945-10-30    ADMISSION DATE:  04/28/2017 CONSULTATION DATE:  04/29/17  REFERRING MD :  Amada Jupiter MD CHIEF COMPLAINT:  S/p CVA s/p TPA  BRIEF PATIENT DESCRIPTION:  71 yr old male with PMHx of NICMP, HFpEF, T2DM, HLD presented with left sided weakness that started at 5:45 pm had a CTA showing a relatively distal M2 occlusion, with very mild symptoms which got progressively worse en route repeat scan showed development of large penumbra  SIGNIFICANT EVENTS  CTH  STUDIES:   CTH, CXR   HISTORY OF PRESENT ILLNESS:    71 yr old male with PMHx of NICMP, HFpEF & 60, T2DM, HLD presented with left sided weakness that started at 5:45 pm had a CTA shows a right M2 branch occlusion, with very mild symptoms which got progressively worse en route repeat scan showed development of large penumbra pt received cerebral angio with mechanical thrombectromy was intubated for the procedure.  Extubated post procedure PCCM consulted for concerns of apnea and inadequate ventilation.  PAST MEDICAL HISTORY :   has a past medical history of Diabetes mellitus without complication (HCC).  has a past surgical history that includes IR PERCUTANEOUS ART THROMBECTOMY/INFUSION INTRACRANIAL INC DIAG ANGIO (04/29/2017). Prior to Admission medications   Not on File   Not on File  FAMILY HISTORY:  family history is not on file. SOCIAL HISTORY:    REVIEW OF SYSTEMS:  Denies any complaints at this time Constitutional: Negative for fever, chills, weight loss, malaise/fatigue and diaphoresis.  HENT: Negative for hearing loss, ear pain, nosebleeds, congestion, sore throat, neck pain, tinnitus and ear discharge.   Eyes: Negative for blurred vision, double vision, photophobia, pain, discharge and redness.  Respiratory: Negative for cough, hemoptysis, sputum production, shortness of breath, wheezing and stridor.   Cardiovascular: Negative for chest pain,  palpitations, orthopnea, claudication, leg swelling and PND.  Gastrointestinal: Negative for heartburn, nausea, vomiting, abdominal pain, diarrhea, constipation, blood in stool and melena.  Genitourinary: Negative for dysuria, urgency, frequency, hematuria and flank pain.  Musculoskeletal: Negative for myalgias, back pain, joint pain and falls.  Skin: Negative for itching and rash.  Neurological: Negative for dizziness, tingling, tremors, sensory change, speech change, focal weakness, seizures, loss of consciousness, weakness and headaches.  Endo/Heme/Allergies: Negative for environmental allergies and polydipsia. Does not bruise/bleed easily.  SUBJECTIVE:   VITAL SIGNS: Temp:  [97.6 F (36.4 C)] 97.6 F (36.4 C) (11/02 0400) Pulse Rate:  [83-93] 86 (11/02 0130) Resp:  [15-21] 19 (11/02 0130) BP: (144-173)/(92-105) 144/94 (11/02 0130) SpO2:  [96 %-100 %] 98 % (11/02 0130)  PHYSICAL EXAMINATION: General: well nourished  Neuro: left sided weakness, reactive pupils,+ hemineglesct  HEENT:  Nasal trumpet in right nare and nasal cannula in place Cardiovascular:  S1 and S2 appreciated  Lungs:  Clear bilaterally  Abdomen:  Soft abdomen + BS Musculoskeletal: no deformity, 1+ pitting edema decreased strength in left arm Skin: grossly intact   Recent Labs Lab 04/29/17 0112  NA 133*  K 4.4  CL 103  CO2 23  BUN 16  CREATININE 0.89  GLUCOSE 273*    Recent Labs Lab 04/29/17 0112  HGB 13.8  HCT 39.6  WBC 14.3*  PLT 205   Ct Head Wo Contrast  Result Date: 04/29/2017 CLINICAL DATA:  Right MCA stroke with percutaneous intervention. EXAM: CT HEAD WITHOUT CONTRAST TECHNIQUE: Contiguous axial images were obtained from the base of the skull through the vertex without intravenous contrast. COMPARISON:  Cerebral angiogram 04/29/2017 Head CT 04/29/2017 FINDINGS: Brain: There is no intracranial hemorrhage. Gray-white differentiation is preserved. Basal ganglia and insular ribbons are  preserved. Vascular: No hyperdense vessel or unexpected calcification.There is mild intravascular enhancement due to presence of circulating contrast. Skull: Normal visualized skull base, calvarium and extracranial soft tissues. Sinuses/Orbits: No sinus fluid levels or advanced mucosal thickening. No mastoid effusion. Normal orbits. IMPRESSION: No intracranial hemorrhage or cytotoxic edema following vascular intervention. Electronically Signed   By: Deatra Robinson M.D.   On: 04/29/2017 04:05   Ct Cerebral Perfusion W Contrast  Result Date: 04/29/2017 CLINICAL DATA:  Code stroke. EXAM: CT HEAD WITHOUT CONTRAST CT CEREBRAL PERFUSION WITH CONTRAST TECHNIQUE: Contiguous axial images were obtained from the base of the skull through the vertex without intravenous contrast. Following the administration of contrast material, CT perfusion scanning of the brain was performed. COMPARISON:  None. FINDINGS: Brain: No mass lesion or acute hemorrhage. No focal hypoattenuation of the basal ganglia or cortex to indicate infarcted tissue. There is periventricular hypoattenuation compatible with chronic microvascular disease. No hydrocephalus or age advanced atrophy. Vascular: No hyperdense vessel. No advanced atherosclerotic calcification of the arteries at the skull base. Skull: Normal visualized skull base, calvarium and extracranial soft tissues. Sinuses/Orbits: No sinus fluid levels or advanced mucosal thickening. No mastoid effusion. Normal orbits. ASPECTS (Alberta Stroke Program Early CT Score) - Ganglionic level infarction (caudate, lentiform nuclei, internal capsule, insula, M1-M3 cortex): 7 - Supraganglionic infarction (M4-M6 cortex): 3 Total score (0-10 with 10 being normal): 10 CT Brain Perfusion Findings: CBF (<30%) Volume: 0mL Perfusion (Tmax>6.0s) volume: 63mL Mismatch Volume: 63mL Ischemia Location:Anterior right MCA distribution IMPRESSION: 1. No acute hemorrhage or mass lesion. 2. ASPECTS is 10. 3. Elevated mean  transit time within the anterior right MCA distribution without associated abnormal cerebral blood volume or cerebral blood flow likely indicates an area of acute ischemia without infarction. These results were called by telephone at the time of interpretation on 04/29/2017 at 12:55 am to Dr. Ritta Slot , who verbally acknowledged these results. Electronically Signed   By: Deatra Robinson M.D.   On: 04/29/2017 01:16   Ir Percutaneous Art Thrombectomy/infusion Intracranial Inc Diag Angio  Result Date: 04/29/2017 INDICATION: 71 year old male with a history of acute right MCA stroke, left-sided symptoms, normal baseline, with stroke scale of 13, aspects 10, within the window for tPA. He presents for angiogram and mechanical thrombectomy as a treatment for emergent large vessel occlusion. EXAM: ULTRASOUND GUIDED ACCESS RIGHT COMMON FEMORAL ARTERY CEREBRAL ANGIOGRAM MECHANICAL THROMBECTOMY RIGHT MCA BRANCHES CLOSURE OF RIGHT COMMON FEMORAL ARTERY ACCESS WITH SUTURE MEDIATED DEVICE COMPARISON:  CT IMAGING OF THE SAME DAY MEDICATIONS: 2.0 g Ancef. The antibiotic was administered within 1 hour of the procedure ANESTHESIA/SEDATION: General anesthesia with anesthesia team CONTRAST:  75 cc Isovue 300 FLUOROSCOPY TIME:  Fluoroscopy Time: 8 minutes 24 seconds (707 mGy). COMPLICATIONS: None TECHNIQUE: Informed written consent was obtained from the patient's family after a thorough discussion of the procedural risks, benefits and alternatives. Specific risks discussed include: Bleeding, infection, contrast reaction, kidney injury/failure, need for further procedure/surgery, arterial injury or dissection, embolization to new territory, intracranial hemorrhage (10-15% risk), neurologic deterioration, cardiopulmonary collapse, death. All questions were addressed. Maximal Sterile Barrier Technique was utilized including during the procedure including caps, mask, sterile gowns, sterile gloves, sterile drape, hand hygiene and  skin antiseptic. A timeout was performed prior to the initiation of the procedure. FINDINGS: Initial: Right common carotid artery:  Normal course caliber and contour. Right external carotid artery:  Patent with antegrade flow. Right internal carotid artery: Mild atherosclerotic changes with plaque at the carotid bifurcation. No significant stenosis at the bifurcation. Normal course caliber and contour of the cervical portion. Vertical and petrous segment patent with normal course caliber contour. Cavernous segment patent. Clinoid segment patent. Antegrade flow of the ophthalmic artery. Ophthalmic segment patent. Terminus patent. Right MCA: M1 segment is patent. There is an early arising temporal branch, with downward course. Inferior division patent on the initial image. There is proximal occlusion of the superior and dominant segment. Right ACA: A 1 segment patent. A 2 segment perfuses the right territory. Final: Right MCA: Restoration of flow through the superior and dominant division, TICI 3 PROCEDURE: Patient was brought to the interventional suite, and identification of the patient was confirmed. Patient was placed supine position with gentle anesthesia initiated with the anesthesia team. The patient is then prepped and draped in the usual sterile fashion. Ultrasound survey of the right inguinal region was performed with images stored and sent to PACs. A micropuncture needle was used access the right common femoral artery under ultrasound. With excellent arterial blood flow returned, and an .018 micro wire was passed through the needle, observed enter the abdominal aorta under fluoroscopy. The needle was removed, and a micropuncture sheath was placed over the wire. The inner dilator and wire were removed, and an 035 Bentson wire was advanced under fluoroscopy into the abdominal aorta. The sheath was removed and a standard 5 [place] vascular sheath was placed. The dilator was removed and the sheath was flushed.  Ultrasound survey of the left inguinal region was then performed with images stored and sent to PACs. A micropuncture needle was used access the left common femoral artery under ultrasound. With excellent arterial blood flow returned, an .018 micro wire was passed through the needle, observed to enter the abdominal aorta under fluoroscopy. The needle was removed, and a micropuncture sheath was placed over the wire. The inner dilator and wire were removed, and an 035 Bentson wire was advanced under fluoroscopy into the abdominal aorta. The sheath was removed and a standard 4 [place] vascular sheath was placed for arterial monitoring. Dilator was removed and the sheath was flushed and attached to the transducer. A 71F JB-1 diagnostic catheter was advanced over the wire to the proximal descending thoracic aorta. Wire was then removed. Double flush of the catheter was performed. Catheter was then used to select the innominate artery. Angiogram was performed. Using roadmap technique, the catheter was advanced over a roadrunner wire into right common carotid artery. Formal angiogram was performed. Exchange length Rosen wire was then passed through the diagnostic catheter to the distal common carotid artery and the diagnostic catheter was removed. The 5 French sheath was removed and exchanged for 8 French 55 centimeter BrightTip sheath. Sheath was flushed and attached to pressurized and heparinized saline bag for constant forward flow. Then an 8 [place], 85 cm Flowgate balloon tip catheter was prepared on the back table with inflation of the balloon with 50/50 concentration of dilute contrast. The balloon catheter was then advanced over the wire, positioned into the distal common carotid artery. Copious back flush was performed and the balloon catheter was attached to heparinized and pressurized saline bag for forward flow. Flowgate balloon catheter was then advanced over the road runner wire into the mid internal carotid  artery. Angiogram was performed for road map guide. Microcatheter system was then introduced through the balloon guide catheter, using a synchro soft 014 wire and a  Trevo Provue18 catheter. Microcatheter system was advanced into the internal carotid artery, to the level of the occlusion. The micro wire was then carefully advanced through the occluded segment. Microcatheter was then push through the occluded segment and the wire was removed. Blood was then aspirated through the hub of the microcatheter, and a gentle contrast injection was performed confirming intraluminal position. A rotating hemostatic valve was then attached to the back end of the microcatheter, and a pressurized and heparinized saline bag was attached to the catheter. 4 x 40 solitaire device was then selected. Back flush was achieved at the rotating hemostatic valve, and then the device was gently advanced through the microcatheter to the distal end. The retriever was then unsheathed by withdrawing the microcatheter under fluoroscopy. Once the retriever was completely unsheathed, control angiogram was performed from the balloon catheter. The balloon at the balloon tip catheter was then inflated under fluoroscopy for proximal flow arrest. Constant aspiration was then performed at the tip of the balloon guide catheter as the retriever was gently and slowly withdrawn with fluoroscopic observation. Once the retriever was entirely removed from the system, free aspiration was confirmed at the hub of the balloon guide catheter, with free blood return confirmed. The balloon was then deflated, rotating hemostatic valve was reattached, and a control angiogram was performed. Control angiogram was performed at the right common femoral artery puncture site. Two overlapping suture mediated devices were used for right common femoral artery closure. Patient tolerated the procedure well and remained hemodynamically stable throughout. No complications were  encountered and no significant blood loss encountered. IMPRESSION: Status post cerebral angiogram and mechanical thrombectomy of proximal occlusion of the superior and dominant MCA branch of the right hemisphere with restoration of TICI 3 flow. Suture mediated closure of right common femoral artery. Signed, Yvone Neu. Loreta Ave, DO [facility] PLAN: Extubated in the interventional suite CT post procedure ICU admission Right leg straight until 8 a.m. Electronically Signed   By: Gilmer Mor D.O.   On: 04/29/2017 04:07   Ct Head Code Stroke Wo Contrast  Result Date: 04/29/2017 CLINICAL DATA:  Code stroke. EXAM: CT HEAD WITHOUT CONTRAST CT CEREBRAL PERFUSION WITH CONTRAST TECHNIQUE: Contiguous axial images were obtained from the base of the skull through the vertex without intravenous contrast. Following the administration of contrast material, CT perfusion scanning of the brain was performed. COMPARISON:  None. FINDINGS: Brain: No mass lesion or acute hemorrhage. No focal hypoattenuation of the basal ganglia or cortex to indicate infarcted tissue. There is periventricular hypoattenuation compatible with chronic microvascular disease. No hydrocephalus or age advanced atrophy. Vascular: No hyperdense vessel. No advanced atherosclerotic calcification of the arteries at the skull base. Skull: Normal visualized skull base, calvarium and extracranial soft tissues. Sinuses/Orbits: No sinus fluid levels or advanced mucosal thickening. No mastoid effusion. Normal orbits. ASPECTS (Alberta Stroke Program Early CT Score) - Ganglionic level infarction (caudate, lentiform nuclei, internal capsule, insula, M1-M3 cortex): 7 - Supraganglionic infarction (M4-M6 cortex): 3 Total score (0-10 with 10 being normal): 10 CT Brain Perfusion Findings: CBF (<30%) Volume: 0mL Perfusion (Tmax>6.0s) volume: 63mL Mismatch Volume: 63mL Ischemia Location:Anterior right MCA distribution  IMPRESSION: 1. No acute hemorrhage or mass lesion. 2. ASPECTS is 10. 3. Elevated mean transit time within the anterior right MCA distribution without associated abnormal cerebral blood volume or cerebral blood flow likely indicates an area of acute ischemia without infarction. These results were called by telephone at the time of interpretation on 04/29/2017 at 12:55  am to Dr. Ritta Slot , who verbally acknowledged these results. Electronically Signed   By: Deatra Robinson M.D.   On: 04/29/2017 01:16    ASSESSMENT / PLAN: NEURO: S/p right M2 branch occlusion GCS 12 Responds to verbal commands  easily arousable    CARDIAC: PMHx of Cardiomyopathy Chronic diastolic heart failure  Last EF Afib  CHADS2VASC- 4 Not requiring vasopressors Hemodynamically stable   PULMONARY: Per Neurology post IR patient having periods of apnea On evaluation  Possibly undiagnosedsS ABG at 4:53 AM 7.322/42/96/22 Start on nasal end tidal monitoring No need for intubation at this time   Endocrine: Type 2 DM w/o h/o of home insulin Started on ICU Glycemic protocol Phas 1 with subcu insulin  GI: NPO Prophylaxis indicated    Heme: If Hgb<7 transfuse PRBCs No signs of active bleeding No signs of coagulopathy DVT PPx-> on hold until cleared by Neuro  RENAL Baseline Cr= Good  Lab Results  Component Value Date   CREATININE 0.89 04/29/2017  correct electrolyte balances      I, Dr Newell Coral have personally reviewed patient's available data, including medical history, events of note, physical examination and test results as part of my evaluation. I have discussed with rPA and other care providers such as pharmacist, RN and RRT. The patient is critically ill with multiple organ systems failure and requires high complexity decision making for assessment and support, frequent evaluation and titration of therapies, application of advanced monitoring technologies and extensive interpretation  of multiple databases. Critical Care Time devoted to patient care services described in this note is 40 Minutes. This time reflects time of care of this signee Dr Newell Coral. This critical care time does not reflect procedure time, or teaching time or supervisory time of PA  but could involve care discussion time    DISPOSITION:Neuro ICU CC TIME: 40 mins  PROGNOSIS:Guarded FAMILY: wife at bedside   Signed Dr Newell Coral Pulmonary Critical Care Locums Pulmonary [facility] Medicine [place] [facility] [place] Pager: [phone]  04/29/2017, 4:51 AM

## 2017-04-29 NOTE — Anesthesia Procedure Notes (Signed)
Procedure Name: Intubation Date/Time: 04/29/2017 1:43 AM Performed by: Claris Che Pre-anesthesia Checklist: Patient identified, Emergency Drugs available, Suction available, Patient being monitored and Timeout performed Patient Re-evaluated:Patient Re-evaluated prior to induction Oxygen Delivery Method: Circle system utilized Preoxygenation: Pre-oxygenation with 100% oxygen Induction Type: IV induction, Rapid sequence and Cricoid Pressure applied Laryngoscope Size: Mac and 3 Grade View: Grade III Tube type: Subglottic suction tube Tube size: 7.5 mm Number of attempts: 1 Airway Equipment and Method: Stylet Placement Confirmation: ETT inserted through vocal cords under direct vision,  positive ETCO2 and breath sounds checked- equal and bilateral Secured at: 24 cm Tube secured with: Tape Dental Injury: Teeth and Oropharynx as per pre-operative assessment

## 2017-04-29 NOTE — Progress Notes (Signed)
Going by arterial line for BP management.   Will continue to monitor closely.  Francia GreavesSavannah R Tavien Chestnut, RN

## 2017-04-29 NOTE — Progress Notes (Signed)
  Echocardiogram 2D Echocardiogram has been performed.  Taccara Bushnell G Mariell Nester 04/29/2017, 1:00 PM

## 2017-04-29 NOTE — Procedures (Signed)
  Date 04/29/17  Referring physician Delia HeadyPramod Sethi  Reason for the study Stroke/altered mental status  Technical Digital EEG recording using 10-20 international Electrode system  Description of the recording Posterior dominant rhythm 6-8Hz  bilateral with reactivity Intermittent focal deltal slowing in right frontotemporal region Epileptiform activity was not seen during this recording  Impression The EEG is abnormal and findings are suggestive of generalized and right fronto temporal focal cerebral dysfunction.Epileptiform activity was not seen during this recording

## 2017-04-29 NOTE — Progress Notes (Signed)
eLink Physician-Brief Progress Note Patient Name: Nathan [name] DOB: 1946-04-13 MRN: [id number]   Date of Service  04/29/2017  HPI/Events of Note  Awaited medical info on notes prior to able to make note  cva penombr CT done fo IR   eICU Interventions  In ir Camera ttempted Will re assess     Intervention Category Evaluation Type: New Patient Evaluation  Nelda BucksFEINSTEIN,DANIEL J. 04/29/2017, 2:35 AM

## 2017-04-29 NOTE — Progress Notes (Signed)
Chief Complaint: Patient was seen today for follow up cerebral angiogram with intervention   Supervising Physician: Gilmer Mor  Patient Status: [facility] - In-pt  Subjective: Status post cerebral angiogram and mechanical thrombectomy of proximal occlusion of the superior and dominant MCA branch of the right hemisphere with restoration of TICI 3 flow. Extubated after procedure. (L)sheath left in place to serve as arterial line. Per RN, pt stable, no adjustments on Cleviprex and cuff pressures correlating. Pt awake and following commands.  Objective: Physical Exam: BP 119/66   Pulse 95   Temp 97.6 F (36.4 C) (Oral)   Resp 14   SpO2 100%  Awake, follows commands. Able to move left arm, fine motor lag and weakness. (R)groin pressure dressing intact, area soft, no ecchymosis. Leg warm, good distal pulse (L)femoral sheath intact, no bleeding or hematoma, foot warm.   Current Facility-Administered Medications:  .  0.9 %  sodium chloride infusion, , Intravenous, Continuous, Rejeana Brock, MD, Last Rate: 75 mL/hr at 04/29/17 1000 .  acetaminophen (TYLENOL) tablet 650 mg, 650 mg, Oral, Q4H PRN **OR** acetaminophen (TYLENOL) solution 650 mg, 650 mg, Per Tube, Q4H PRN **OR** acetaminophen (TYLENOL) suppository 650 mg, 650 mg, Rectal, Q4H PRN, Rejeana Brock, MD .  clevidipine (CLEVIPREX) infusion 0.5 mg/mL, 0-21 mg/hr, Intravenous, Continuous, Sethi, Pramod S, MD .  insulin aspart (novoLOG) injection 0-20 Units, 0-20 Units, Subcutaneous, Q4H, Micki Riley, MD, 20 Units at 04/29/17 0932 .  iopamidol (ISOVUE-370) 76 % injection, , , ,  .  nitroGLYCERIN 100 MCG/ML intra-arterial injection, , , ,  .  ondansetron (ZOFRAN) injection 4 mg, 4 mg, Intravenous, Q6H PRN, Gilmer Mor, DO .  senna-docusate (Senokot-S) tablet 1 tablet, 1 tablet, Oral, QHS PRN, Rejeana Brock, MD  Labs: CBC  Recent Labs  04/29/17 0112  WBC 14.3*  HGB 13.8  HCT 39.6  PLT 205    BMET  Recent Labs  04/29/17 0112  NA 133*  K 4.4  CL 103  CO2 23  GLUCOSE 273*  BUN 16  CREATININE 0.89  CALCIUM 8.8*   LFT  Recent Labs  04/29/17 0112  PROT 7.2  ALBUMIN 3.7  AST 22  ALT 22  ALKPHOS 92  BILITOT 0.9   PT/INR No results for input(s): LABPROT, INR in the last 72 hours.   Studies/Results: Ct Head Wo Contrast  Result Date: 04/29/2017 CLINICAL DATA:  Right MCA stroke with percutaneous intervention. EXAM: CT HEAD WITHOUT CONTRAST TECHNIQUE: Contiguous axial images were obtained from the base of the skull through the vertex without intravenous contrast. COMPARISON:  Cerebral angiogram 04/29/2017 Head CT 04/29/2017 FINDINGS: Brain: There is no intracranial hemorrhage. Gray-white differentiation is preserved. Basal ganglia and insular ribbons are preserved. Vascular: No hyperdense vessel or unexpected calcification.There is mild intravascular enhancement due to presence of circulating contrast. Skull: Normal visualized skull base, calvarium and extracranial soft tissues. Sinuses/Orbits: No sinus fluid levels or advanced mucosal thickening. No mastoid effusion. Normal orbits. IMPRESSION: No intracranial hemorrhage or cytotoxic edema following vascular intervention. Electronically Signed   By: Deatra Robinson M.D.   On: 04/29/2017 04:05   Ir US Guide Vasc Access Left  Result Date: 04/29/2017 INDICATION: 71 year old male with a history of acute right MCA stroke, left-sided symptoms, normal baseline, with stroke scale of 13, aspects 10, within the window for tPA. He presents for angiogram and mechanical thrombectomy as a treatment for emergent large vessel occlusion. EXAM: ULTRASOUND GUIDED ACCESS RIGHT COMMON FEMORAL ARTERY CEREBRAL ANGIOGRAM MECHANICAL THROMBECTOMY RIGHT  MCA BRANCHES CLOSURE OF RIGHT COMMON FEMORAL ARTERY ACCESS WITH SUTURE MEDIATED DEVICE COMPARISON:  CT IMAGING OF THE SAME DAY MEDICATIONS: 2.0 g Ancef. The antibiotic was administered within 1 hour of  the procedure ANESTHESIA/SEDATION: General anesthesia with anesthesia team CONTRAST:  75 cc Isovue 300 FLUOROSCOPY TIME:  Fluoroscopy Time: 8 minutes 24 seconds (707 mGy). COMPLICATIONS: None TECHNIQUE: Informed written consent was obtained from the patient's family after a thorough discussion of the procedural risks, benefits and alternatives. Specific risks discussed include: Bleeding, infection, contrast reaction, kidney injury/failure, need for further procedure/surgery, arterial injury or dissection, embolization to new territory, intracranial hemorrhage (10-15% risk), neurologic deterioration, cardiopulmonary collapse, death. All questions were addressed. Maximal Sterile Barrier Technique was utilized including during the procedure including caps, mask, sterile gowns, sterile gloves, sterile drape, hand hygiene and skin antiseptic. A timeout was performed prior to the initiation of the procedure. FINDINGS: Initial: Right common carotid artery:  Normal course caliber and contour. Right external carotid artery: Patent with antegrade flow. Right internal carotid artery: Mild atherosclerotic changes with plaque at the carotid bifurcation. No significant stenosis at the bifurcation. Normal course caliber and contour of the cervical portion. Vertical and petrous segment patent with normal course caliber contour. Cavernous segment patent. Clinoid segment patent. Antegrade flow of the ophthalmic artery. Ophthalmic segment patent. Terminus patent. Right MCA: M1 segment is patent. There is an early arising temporal branch, with downward course. Inferior division patent on the initial image. There is proximal occlusion of the superior and dominant segment. Right ACA: A 1 segment patent. A 2 segment perfuses the right territory. Final: Right MCA: Restoration of flow through the superior and dominant division, TICI 3 PROCEDURE: Patient was brought to the interventional suite, and identification of the patient was  confirmed. Patient was placed supine position with gentle anesthesia initiated with the anesthesia team. The patient is then prepped and draped in the usual sterile fashion. Ultrasound survey of the right inguinal region was performed with images stored and sent to PACs. A micropuncture needle was used access the right common femoral artery under ultrasound. With excellent arterial blood flow returned, and an .018 micro wire was passed through the needle, observed enter the abdominal aorta under fluoroscopy. The needle was removed, and a micropuncture sheath was placed over the wire. The inner dilator and wire were removed, and an 035 Bentson wire was advanced under fluoroscopy into the abdominal aorta. The sheath was removed and a standard 5 [place] vascular sheath was placed. The dilator was removed and the sheath was flushed. Ultrasound survey of the left inguinal region was then performed with images stored and sent to PACs. A micropuncture needle was used access the left common femoral artery under ultrasound. With excellent arterial blood flow returned, an .018 micro wire was passed through the needle, observed to enter the abdominal aorta under fluoroscopy. The needle was removed, and a micropuncture sheath was placed over the wire. The inner dilator and wire were removed, and an 035 Bentson wire was advanced under fluoroscopy into the abdominal aorta. The sheath was removed and a standard 4 [place] vascular sheath was placed for arterial monitoring. Dilator was removed and the sheath was flushed and attached to the transducer. A 2F JB-1 diagnostic catheter was advanced over the wire to the proximal descending thoracic aorta. Wire was then removed. Double flush of the catheter was performed. Catheter was then used to select the innominate artery. Angiogram was performed. Using roadmap technique, the catheter was advanced over a roadrunner wire  into right common carotid artery. Formal angiogram was performed.  Exchange length Rosen wire was then passed through the diagnostic catheter to the distal common carotid artery and the diagnostic catheter was removed. The 5 French sheath was removed and exchanged for 8 French 55 centimeter BrightTip sheath. Sheath was flushed and attached to pressurized and heparinized saline bag for constant forward flow. Then an 8 [place], 85 cm Flowgate balloon tip catheter was prepared on the back table with inflation of the balloon with 50/50 concentration of dilute contrast. The balloon catheter was then advanced over the wire, positioned into the distal common carotid artery. Copious back flush was performed and the balloon catheter was attached to heparinized and pressurized saline bag for forward flow. Flowgate balloon catheter was then advanced over the road runner wire into the mid internal carotid artery. Angiogram was performed for road map guide. Microcatheter system was then introduced through the balloon guide catheter, using a synchro soft 014 wire and a Trevo Provue18 catheter. Microcatheter system was advanced into the internal carotid artery, to the level of the occlusion. The micro wire was then carefully advanced through the occluded segment. Microcatheter was then push through the occluded segment and the wire was removed. Blood was then aspirated through the hub of the microcatheter, and a gentle contrast injection was performed confirming intraluminal position. A rotating hemostatic valve was then attached to the back end of the microcatheter, and a pressurized and heparinized saline bag was attached to the catheter. 4 x 40 solitaire device was then selected. Back flush was achieved at the rotating hemostatic valve, and then the device was gently advanced through the microcatheter to the distal end. The retriever was then unsheathed by withdrawing the microcatheter under fluoroscopy. Once the retriever was completely unsheathed, control angiogram was performed from the  balloon catheter. The balloon at the balloon tip catheter was then inflated under fluoroscopy for proximal flow arrest. Constant aspiration was then performed at the tip of the balloon guide catheter as the retriever was gently and slowly withdrawn with fluoroscopic observation. Once the retriever was entirely removed from the system, free aspiration was confirmed at the hub of the balloon guide catheter, with free blood return confirmed. The balloon was then deflated, rotating hemostatic valve was reattached, and a control angiogram was performed. Control angiogram was performed at the right common femoral artery puncture site. Two overlapping suture mediated devices were used for right common femoral artery closure. Patient tolerated the procedure well and remained hemodynamically stable throughout. No complications were encountered and no significant blood loss encountered. IMPRESSION: Status post cerebral angiogram and mechanical thrombectomy of proximal occlusion of the superior and dominant MCA branch of the right hemisphere with restoration of TICI 3 flow. Suture mediated closure of right common femoral artery. Signed, Yvone Neu. Loreta Ave, DO [facility] PLAN: Extubated in the interventional suite CT post procedure ICU admission Right leg straight until 8 a.m. Electronically Signed   By: Gilmer Mor D.O.   On: 04/29/2017 04:07   Ir US Guide Vasc Access Right  Result Date: 04/29/2017 INDICATION: 71 year old male with a history of acute right MCA stroke, left-sided symptoms, normal baseline, with stroke scale of 13, aspects 10, within the window for tPA. He presents for angiogram and mechanical thrombectomy as a treatment for emergent large vessel occlusion. EXAM: ULTRASOUND GUIDED ACCESS RIGHT COMMON FEMORAL ARTERY CEREBRAL ANGIOGRAM MECHANICAL THROMBECTOMY RIGHT MCA BRANCHES CLOSURE OF RIGHT COMMON FEMORAL ARTERY ACCESS WITH SUTURE MEDIATED DEVICE  COMPARISON:  CT IMAGING OF THE SAME DAY MEDICATIONS: 2.0 g Ancef. The antibiotic was administered within 1 hour of the procedure ANESTHESIA/SEDATION: General anesthesia with anesthesia team CONTRAST:  75 cc Isovue 300 FLUOROSCOPY TIME:  Fluoroscopy Time: 8 minutes 24 seconds (707 mGy). COMPLICATIONS: None TECHNIQUE: Informed written consent was obtained from the patient's family after a thorough discussion of the procedural risks, benefits and alternatives. Specific risks discussed include: Bleeding, infection, contrast reaction, kidney injury/failure, need for further procedure/surgery, arterial injury or dissection, embolization to new territory, intracranial hemorrhage (10-15% risk), neurologic deterioration, cardiopulmonary collapse, death. All questions were addressed. Maximal Sterile Barrier Technique was utilized including during the procedure including caps, mask, sterile gowns, sterile gloves, sterile drape, hand hygiene and skin antiseptic. A timeout was performed prior to the initiation of the procedure. FINDINGS: Initial: Right common carotid artery:  Normal course caliber and contour. Right external carotid artery: Patent with antegrade flow. Right internal carotid artery: Mild atherosclerotic changes with plaque at the carotid bifurcation. No significant stenosis at the bifurcation. Normal course caliber and contour of the cervical portion. Vertical and petrous segment patent with normal course caliber contour. Cavernous segment patent. Clinoid segment patent. Antegrade flow of the ophthalmic artery. Ophthalmic segment patent. Terminus patent. Right MCA: M1 segment is patent. There is an early arising temporal branch, with downward course. Inferior division patent on the initial image. There is proximal occlusion of the superior and dominant segment. Right ACA: A 1 segment patent. A 2 segment perfuses the right territory. Final: Right MCA: Restoration of flow through the superior and dominant division,  TICI 3 PROCEDURE: Patient was brought to the interventional suite, and identification of the patient was confirmed. Patient was placed supine position with gentle anesthesia initiated with the anesthesia team. The patient is then prepped and draped in the usual sterile fashion. Ultrasound survey of the right inguinal region was performed with images stored and sent to PACs. A micropuncture needle was used access the right common femoral artery under ultrasound. With excellent arterial blood flow returned, and an .018 micro wire was passed through the needle, observed enter the abdominal aorta under fluoroscopy. The needle was removed, and a micropuncture sheath was placed over the wire. The inner dilator and wire were removed, and an 035 Bentson wire was advanced under fluoroscopy into the abdominal aorta. The sheath was removed and a standard 5 [place] vascular sheath was placed. The dilator was removed and the sheath was flushed. Ultrasound survey of the left inguinal region was then performed with images stored and sent to PACs. A micropuncture needle was used access the left common femoral artery under ultrasound. With excellent arterial blood flow returned, an .018 micro wire was passed through the needle, observed to enter the abdominal aorta under fluoroscopy. The needle was removed, and a micropuncture sheath was placed over the wire. The inner dilator and wire were removed, and an 035 Bentson wire was advanced under fluoroscopy into the abdominal aorta. The sheath was removed and a standard 4 [place] vascular sheath was placed for arterial monitoring. Dilator was removed and the sheath was flushed and attached to the transducer. A 48F JB-1 diagnostic catheter was advanced over the wire to the proximal descending thoracic aorta. Wire was then removed. Double flush of the catheter was performed. Catheter was then used to select the innominate artery. Angiogram was performed. Using roadmap technique, the catheter  was advanced over a roadrunner wire into right common carotid artery. Formal angiogram was performed. Exchange length Rosen wire  was then passed through the diagnostic catheter to the distal common carotid artery and the diagnostic catheter was removed. The 5 French sheath was removed and exchanged for 8 French 55 centimeter BrightTip sheath. Sheath was flushed and attached to pressurized and heparinized saline bag for constant forward flow. Then an 8 [place], 85 cm Flowgate balloon tip catheter was prepared on the back table with inflation of the balloon with 50/50 concentration of dilute contrast. The balloon catheter was then advanced over the wire, positioned into the distal common carotid artery. Copious back flush was performed and the balloon catheter was attached to heparinized and pressurized saline bag for forward flow. Flowgate balloon catheter was then advanced over the road runner wire into the mid internal carotid artery. Angiogram was performed for road map guide. Microcatheter system was then introduced through the balloon guide catheter, using a synchro soft 014 wire and a Trevo Provue18 catheter. Microcatheter system was advanced into the internal carotid artery, to the level of the occlusion. The micro wire was then carefully advanced through the occluded segment. Microcatheter was then push through the occluded segment and the wire was removed. Blood was then aspirated through the hub of the microcatheter, and a gentle contrast injection was performed confirming intraluminal position. A rotating hemostatic valve was then attached to the back end of the microcatheter, and a pressurized and heparinized saline bag was attached to the catheter. 4 x 40 solitaire device was then selected. Back flush was achieved at the rotating hemostatic valve, and then the device was gently advanced through the microcatheter to the distal end. The retriever was then unsheathed by withdrawing the microcatheter under  fluoroscopy. Once the retriever was completely unsheathed, control angiogram was performed from the balloon catheter. The balloon at the balloon tip catheter was then inflated under fluoroscopy for proximal flow arrest. Constant aspiration was then performed at the tip of the balloon guide catheter as the retriever was gently and slowly withdrawn with fluoroscopic observation. Once the retriever was entirely removed from the system, free aspiration was confirmed at the hub of the balloon guide catheter, with free blood return confirmed. The balloon was then deflated, rotating hemostatic valve was reattached, and a control angiogram was performed. Control angiogram was performed at the right common femoral artery puncture site. Two overlapping suture mediated devices were used for right common femoral artery closure. Patient tolerated the procedure well and remained hemodynamically stable throughout. No complications were encountered and no significant blood loss encountered. IMPRESSION: Status post cerebral angiogram and mechanical thrombectomy of proximal occlusion of the superior and dominant MCA branch of the right hemisphere with restoration of TICI 3 flow. Suture mediated closure of right common femoral artery. Signed, Yvone NeuJaime S. Loreta AveWagner, DO [facility] PLAN: Extubated in the interventional suite CT post procedure ICU admission Right leg straight until 8 a.m. Electronically Signed   By: Gilmer MorJaime  Wagner D.O.   On: 04/29/2017 04:07   Ct Cerebral Perfusion W Contrast  Result Date: 04/29/2017 CLINICAL DATA:  Code stroke. EXAM: CT HEAD WITHOUT CONTRAST CT CEREBRAL PERFUSION WITH CONTRAST TECHNIQUE: Contiguous axial images were obtained from the base of the skull through the vertex without intravenous contrast. Following the administration of contrast material, CT perfusion scanning of the brain was performed. COMPARISON:  None. FINDINGS: Brain: No mass lesion  or acute hemorrhage. No focal hypoattenuation of the basal ganglia or cortex to indicate infarcted tissue. There is periventricular hypoattenuation compatible with chronic microvascular disease. No hydrocephalus or  age advanced atrophy. Vascular: No hyperdense vessel. No advanced atherosclerotic calcification of the arteries at the skull base. Skull: Normal visualized skull base, calvarium and extracranial soft tissues. Sinuses/Orbits: No sinus fluid levels or advanced mucosal thickening. No mastoid effusion. Normal orbits. ASPECTS ([place] Stroke Program Early CT Score) - Ganglionic level infarction (caudate, lentiform nuclei, internal capsule, insula, M1-M3 cortex): 7 - Supraganglionic infarction (M4-M6 cortex): 3 Total score (0-10 with 10 being normal): 10 CT Brain Perfusion Findings: CBF (<30%) Volume: 0mL Perfusion (Tmax>6.0s) volume: 63mL Mismatch Volume: 63mL Ischemia Location:Anterior right MCA distribution IMPRESSION: 1. No acute hemorrhage or mass lesion. 2. ASPECTS is 10. 3. Elevated mean transit time within the anterior right MCA distribution without associated abnormal cerebral blood volume or cerebral blood flow likely indicates an area of acute ischemia without infarction. These results were called by telephone at the time of interpretation on 04/29/2017 at 12:55 am to Dr. Ritta Slot , who verbally acknowledged these results. Electronically Signed   By: Deatra Robinson M.D.   On: 04/29/2017 01:16   Dg Chest Port 1 View  Result Date: 04/29/2017 CLINICAL DATA:  Acute respiratory failure.  Stroke. EXAM: PORTABLE CHEST 1 VIEW COMPARISON:  Chest yesterday radiograph at [facility]. FINDINGS: Cardiomegaly accentuated by lower lung volumes. Crowding of bronchovascular structures with mild vascular congestion. Minimal retrocardiac atelectasis. No pleural fluid or pneumothorax. IMPRESSION: Cardiomegaly with mild vascular congestion. Electronically Signed   By: Rubye Oaks M.D.   On:  04/29/2017 05:32   Ir Percutaneous Art Thrombectomy/infusion Intracranial Inc Diag Angio  Result Date: 04/29/2017 INDICATION: 71 year old male with a history of acute right MCA stroke, left-sided symptoms, normal baseline, with stroke scale of 13, aspects 10, within the window for tPA. He presents for angiogram and mechanical thrombectomy as a treatment for emergent large vessel occlusion. EXAM: ULTRASOUND GUIDED ACCESS RIGHT COMMON FEMORAL ARTERY CEREBRAL ANGIOGRAM MECHANICAL THROMBECTOMY RIGHT MCA BRANCHES CLOSURE OF RIGHT COMMON FEMORAL ARTERY ACCESS WITH SUTURE MEDIATED DEVICE COMPARISON:  CT IMAGING OF THE SAME DAY MEDICATIONS: 2.0 g Ancef. The antibiotic was administered within 1 hour of the procedure ANESTHESIA/SEDATION: General anesthesia with anesthesia team CONTRAST:  75 cc Isovue 300 FLUOROSCOPY TIME:  Fluoroscopy Time: 8 minutes 24 seconds (707 mGy). COMPLICATIONS: None TECHNIQUE: Informed written consent was obtained from the patient's family after a thorough discussion of the procedural risks, benefits and alternatives. Specific risks discussed include: Bleeding, infection, contrast reaction, kidney injury/failure, need for further procedure/surgery, arterial injury or dissection, embolization to new territory, intracranial hemorrhage (10-15% risk), neurologic deterioration, cardiopulmonary collapse, death. All questions were addressed. Maximal Sterile Barrier Technique was utilized including during the procedure including caps, mask, sterile gowns, sterile gloves, sterile drape, hand hygiene and skin antiseptic. A timeout was performed prior to the initiation of the procedure. FINDINGS: Initial: Right common carotid artery:  Normal course caliber and contour. Right external carotid artery: Patent with antegrade flow. Right internal carotid artery: Mild atherosclerotic changes with plaque at the carotid bifurcation. No significant stenosis at the bifurcation. Normal course caliber and contour of  the cervical portion. Vertical and petrous segment patent with normal course caliber contour. Cavernous segment patent. Clinoid segment patent. Antegrade flow of the ophthalmic artery. Ophthalmic segment patent. Terminus patent. Right MCA: M1 segment is patent. There is an early arising temporal branch, with downward course. Inferior division patent on the initial image. There is proximal occlusion of the superior and dominant segment. Right ACA: A 1 segment patent. A 2 segment perfuses the right territory. Final: Right MCA: Restoration  of flow through the superior and dominant division, TICI 3 PROCEDURE: Patient was brought to the interventional suite, and identification of the patient was confirmed. Patient was placed supine position with gentle anesthesia initiated with the anesthesia team. The patient is then prepped and draped in the usual sterile fashion. Ultrasound survey of the right inguinal region was performed with images stored and sent to PACs. A micropuncture needle was used access the right common femoral artery under ultrasound. With excellent arterial blood flow returned, and an .018 micro wire was passed through the needle, observed enter the abdominal aorta under fluoroscopy. The needle was removed, and a micropuncture sheath was placed over the wire. The inner dilator and wire were removed, and an 035 Bentson wire was advanced under fluoroscopy into the abdominal aorta. The sheath was removed and a standard 5 [place] vascular sheath was placed. The dilator was removed and the sheath was flushed. Ultrasound survey of the left inguinal region was then performed with images stored and sent to PACs. A micropuncture needle was used access the left common femoral artery under ultrasound. With excellent arterial blood flow returned, an .018 micro wire was passed through the needle, observed to enter the abdominal aorta under fluoroscopy. The needle was removed, and a micropuncture sheath was placed over  the wire. The inner dilator and wire were removed, and an 035 Bentson wire was advanced under fluoroscopy into the abdominal aorta. The sheath was removed and a standard 4 [place] vascular sheath was placed for arterial monitoring. Dilator was removed and the sheath was flushed and attached to the transducer. A 67F JB-1 diagnostic catheter was advanced over the wire to the proximal descending thoracic aorta. Wire was then removed. Double flush of the catheter was performed. Catheter was then used to select the innominate artery. Angiogram was performed. Using roadmap technique, the catheter was advanced over a roadrunner wire into right common carotid artery. Formal angiogram was performed. Exchange length Rosen wire was then passed through the diagnostic catheter to the distal common carotid artery and the diagnostic catheter was removed. The 5 French sheath was removed and exchanged for 8 French 55 centimeter BrightTip sheath. Sheath was flushed and attached to pressurized and heparinized saline bag for constant forward flow. Then an 8 [place], 85 cm Flowgate balloon tip catheter was prepared on the back table with inflation of the balloon with 50/50 concentration of dilute contrast. The balloon catheter was then advanced over the wire, positioned into the distal common carotid artery. Copious back flush was performed and the balloon catheter was attached to heparinized and pressurized saline bag for forward flow. Flowgate balloon catheter was then advanced over the road runner wire into the mid internal carotid artery. Angiogram was performed for road map guide. Microcatheter system was then introduced through the balloon guide catheter, using a synchro soft 014 wire and a Trevo Provue18 catheter. Microcatheter system was advanced into the internal carotid artery, to the level of the occlusion. The micro wire was then carefully advanced through the occluded segment. Microcatheter was then push through the occluded  segment and the wire was removed. Blood was then aspirated through the hub of the microcatheter, and a gentle contrast injection was performed confirming intraluminal position. A rotating hemostatic valve was then attached to the back end of the microcatheter, and a pressurized and heparinized saline bag was attached to the catheter. 4 x 40 solitaire device was then selected. Back flush was achieved at the rotating hemostatic valve, and then the device  was gently advanced through the microcatheter to the distal end. The retriever was then unsheathed by withdrawing the microcatheter under fluoroscopy. Once the retriever was completely unsheathed, control angiogram was performed from the balloon catheter. The balloon at the balloon tip catheter was then inflated under fluoroscopy for proximal flow arrest. Constant aspiration was then performed at the tip of the balloon guide catheter as the retriever was gently and slowly withdrawn with fluoroscopic observation. Once the retriever was entirely removed from the system, free aspiration was confirmed at the hub of the balloon guide catheter, with free blood return confirmed. The balloon was then deflated, rotating hemostatic valve was reattached, and a control angiogram was performed. Control angiogram was performed at the right common femoral artery puncture site. Two overlapping suture mediated devices were used for right common femoral artery closure. Patient tolerated the procedure well and remained hemodynamically stable throughout. No complications were encountered and no significant blood loss encountered. IMPRESSION: Status post cerebral angiogram and mechanical thrombectomy of proximal occlusion of the superior and dominant MCA branch of the right hemisphere with restoration of TICI 3 flow. Suture mediated closure of right common femoral artery. Signed, Yvone Neu. Loreta Ave, DO [facility] Radiology PLAN: Extubated in  the interventional suite CT post procedure ICU admission Right leg straight until 8 a.m. Electronically Signed   By: Gilmer Mor D.O.   On: 04/29/2017 04:07   Ct Head Code Stroke Wo Contrast  Result Date: 04/29/2017 CLINICAL DATA:  Code stroke. EXAM: CT HEAD WITHOUT CONTRAST CT CEREBRAL PERFUSION WITH CONTRAST TECHNIQUE: Contiguous axial images were obtained from the base of the skull through the vertex without intravenous contrast. Following the administration of contrast material, CT perfusion scanning of the brain was performed. COMPARISON:  None. FINDINGS: Brain: No mass lesion or acute hemorrhage. No focal hypoattenuation of the basal ganglia or cortex to indicate infarcted tissue. There is periventricular hypoattenuation compatible with chronic microvascular disease. No hydrocephalus or age advanced atrophy. Vascular: No hyperdense vessel. No advanced atherosclerotic calcification of the arteries at the skull base. Skull: Normal visualized skull base, calvarium and extracranial soft tissues. Sinuses/Orbits: No sinus fluid levels or advanced mucosal thickening. No mastoid effusion. Normal orbits. ASPECTS ([place]berta Stroke Program Early CT Score) - Ganglionic level infarction (caudate, lentiform nuclei, internal capsule, insula, M1-M3 cortex): 7 - Supraganglionic infarction (M4-M6 cortex): 3 Total score (0-10 with 10 being normal): 10 CT Brain Perfusion Findings: CBF (<30%) Volume: 0mL Perfusion (Tmax>6.0s) volume: 63mL Mismatch Volume: 63mL Ischemia Location:Anterior right MCA distribution IMPRESSION: 1. No acute hemorrhage or mass lesion. 2. ASPECTS is 10. 3. Elevated mean transit time within the anterior right MCA distribution without associated abnormal cerebral blood volume or cerebral blood flow likely indicates an area of acute ischemia without infarction. These results were called by telephone at the time of interpretation on 04/29/2017 at 12:55 am to Dr. Ritta Slot , who verbally  acknowledged these results. Electronically Signed   By: Deatra Robinson M.D.   On: 04/29/2017 01:16    Assessment/Plan: (R)MCA CVA Status post cerebral angiogram and mechanical thrombectomy of proximal occlusion of the superior and dominant MCA branch of the right hemisphere with restoration of TICI 3 flow. Ok to remove (L)femoral sheath, orders placed. IR following    LOS: 1 day   I spent a total of 25 minutes in face to face in clinical consultation, greater than 50% of which was counseling/coordinating care for CVA s/p NIR intervention.  Brayton El PA-C 04/29/2017 10:27 AM

## 2017-04-30 ENCOUNTER — Inpatient Hospital Stay (HOSPITAL_COMMUNITY): Payer: Medicare Other

## 2017-04-30 DIAGNOSIS — D72829 Elevated white blood cell count, unspecified: Secondary | ICD-10-CM

## 2017-04-30 DIAGNOSIS — E1165 Type 2 diabetes mellitus with hyperglycemia: Secondary | ICD-10-CM

## 2017-04-30 DIAGNOSIS — I639 Cerebral infarction, unspecified: Secondary | ICD-10-CM

## 2017-04-30 DIAGNOSIS — E785 Hyperlipidemia, unspecified: Secondary | ICD-10-CM

## 2017-04-30 DIAGNOSIS — I63411 Cerebral infarction due to embolism of right middle cerebral artery: Secondary | ICD-10-CM

## 2017-04-30 DIAGNOSIS — I255 Ischemic cardiomyopathy: Secondary | ICD-10-CM

## 2017-04-30 LAB — CBC
HCT: 32.3 % — ABNORMAL LOW (ref 39.0–52.0)
HEMOGLOBIN: 11.1 g/dL — AB (ref 13.0–17.0)
MCH: 31.5 pg (ref 26.0–34.0)
MCHC: 34.4 g/dL (ref 30.0–36.0)
MCV: 91.8 fL (ref 78.0–100.0)
PLATELETS: 184 10*3/uL (ref 150–400)
RBC: 3.52 MIL/uL — ABNORMAL LOW (ref 4.22–5.81)
RDW: 12.7 % (ref 11.5–15.5)
WBC: 21.6 10*3/uL — ABNORMAL HIGH (ref 4.0–10.5)

## 2017-04-30 LAB — BASIC METABOLIC PANEL
Anion gap: 6 (ref 5–15)
BUN: 19 mg/dL (ref 6–20)
CALCIUM: 8.8 mg/dL — AB (ref 8.9–10.3)
CO2: 24 mmol/L (ref 22–32)
CREATININE: 0.81 mg/dL (ref 0.61–1.24)
Chloride: 106 mmol/L (ref 101–111)
GFR calc Af Amer: >60 mL/min (ref >60)
GFR calc non Af Amer: >60 mL/min (ref >60)
GLUCOSE: 165 mg/dL — AB (ref 65–99)
Potassium: 4 mmol/L (ref 3.5–5.1)
Sodium: 136 mmol/L (ref 135–145)

## 2017-04-30 LAB — GLUCOSE, CAPILLARY
GLUCOSE-CAPILLARY: 153 mg/dL — AB (ref 65–99)
Glucose-Capillary: 121 mg/dL — ABNORMAL HIGH (ref 65–99)
Glucose-Capillary: 159 mg/dL — ABNORMAL HIGH (ref 65–99)
Glucose-Capillary: 145 mg/dL — ABNORMAL HIGH (ref 65–99)
Glucose-Capillary: 206 mg/dL — ABNORMAL HIGH (ref 65–99)
Glucose-Capillary: 188 mg/dL — ABNORMAL HIGH (ref 65–99)

## 2017-04-30 MED ORDER — REPAGLINIDE 0.5 MG PO TABS
0.2500 mg | ORAL_TABLET | Freq: Three times a day (TID) | ORAL | Status: DC
  Administered 2017-05-01 – 2017-05-03 (×6): 0.25 mg via ORAL
  Filled 2017-04-30 (×10): qty 1

## 2017-04-30 MED ORDER — CARVEDILOL 12.5 MG PO TABS: 25.0000 mg | ORAL_TABLET | Freq: Two times a day (BID) | ORAL | Status: DC

## 2017-04-30 MED ORDER — GLIPIZIDE ER 10 MG PO TB24
10.0000 mg | ORAL_TABLET | Freq: Every day | ORAL | Status: DC
  Administered 2017-04-30 – 2017-05-03 (×4): 10 mg via ORAL
  Filled 2017-04-30 (×5): qty 1

## 2017-04-30 MED ORDER — HEPARIN SODIUM (PORCINE) 5000 UNIT/ML IJ SOLN
5000.0000 [IU] | Freq: Three times a day (TID) | INTRAMUSCULAR | Status: DC
Start: 2017-04-30 — End: 2017-05-03
  Administered 2017-04-30 – 2017-05-03 (×8): 5000 [IU] via SUBCUTANEOUS
  Filled 2017-04-30 (×8): qty 1

## 2017-04-30 MED ORDER — PANTOPRAZOLE SODIUM 40 MG PO TBEC
40.0000 mg | DELAYED_RELEASE_TABLET | Freq: Every day | ORAL | Status: DC
  Administered 2017-04-30 – 2017-05-03 (×4): 40 mg via ORAL
  Filled 2017-04-30 (×4): qty 1

## 2017-04-30 MED ORDER — SIMVASTATIN 40 MG PO TABS
40.0000 mg | ORAL_TABLET | Freq: Every day | ORAL | Status: DC
  Administered 2017-04-30 – 2017-05-02 (×3): 40 mg via ORAL
  Filled 2017-04-30 (×4): qty 1

## 2017-04-30 MED ORDER — CLOPIDOGREL BISULFATE 75 MG PO TABS
75.0000 mg | ORAL_TABLET | Freq: Every day | ORAL | Status: DC
  Administered 2017-04-30 – 2017-05-02 (×3): 75 mg via ORAL
  Filled 2017-04-30 (×3): qty 1

## 2017-04-30 MED ORDER — LISINOPRIL 5 MG PO TABS
5.0000 mg | ORAL_TABLET | Freq: Every day | ORAL | Status: DC
  Administered 2017-05-02 – 2017-05-03 (×2): 5 mg via ORAL
  Filled 2017-04-30 (×4): qty 1

## 2017-04-30 MED ORDER — CARVEDILOL 6.25 MG PO TABS
6.2500 mg | ORAL_TABLET | Freq: Two times a day (BID) | ORAL | Status: DC
  Administered 2017-04-30 – 2017-05-01 (×2): 6.25 mg via ORAL
  Filled 2017-04-30 (×2): qty 2
  Filled 2017-04-30: qty 1
  Filled 2017-04-30: qty 2

## 2017-04-30 MED ORDER — TAMSULOSIN HCL 0.4 MG PO CAPS
0.4000 mg | ORAL_CAPSULE | Freq: Every day | ORAL | Status: DC
  Administered 2017-04-30 – 2017-05-03 (×5): 0.4 mg via ORAL
  Filled 2017-04-30 (×5): qty 1

## 2017-04-30 NOTE — Evaluation (Signed)
Physical Therapy Evaluation Patient Details Name: Nathan [name] MRN: [id number] DOB: Nov 18, 1945 Today's Date: 04/30/2017   History of Present Illness  Pt admitted on 04/29/17 with M2 occlusion with TPA given  Clinical Impression  Orders received for PT evaluation. Patient demonstrates deficits in functional mobility as indicated below. Will benefit from continued skilled PT to address deficits and maximize function. Will see as indicated and progress as tolerated.  At this time, recommend HHPT upon acute discharge and home health Aide if able as wife states she is available intially but will need to return to work.    Follow Up Recommendations Home health PT;Supervision/Assistance - 24 hour (Home health Aide )    Equipment Recommendations  None recommended by PT    Recommendations for Other Services       Precautions / Restrictions Precautions Precautions: Fall Restrictions Weight Bearing Restrictions: No      Mobility  Bed Mobility Overal bed mobility: Needs Assistance Bed Mobility: Supine to Sit     Supine to sit: Min guard     General bed mobility comments: min guard for safety, no physical assist required.  Transfers Overall transfer level: Needs assistance Equipment used: 1 person hand held assist Transfers: Sit to/from Stand Sit to Stand: Min assist         General transfer comment: min assist for stability intially, otherwise min guard performed x4 during session (from bed, and toilet x2  Ambulation/Gait Ambulation/Gait assistance: Min guard Ambulation Distance (Feet): 110 Feet Assistive device: None Gait Pattern/deviations: Step-through pattern;Decreased stride length;Drifts right/left Gait velocity: decreased   General Gait Details: patient with some instability noted. Patient with some noted LLE drag intermittently. Modest LOB noted but able to self correct.  Stairs            Wheelchair Mobility    Modified Rankin (Stroke Patients  Only) Modified Rankin (Stroke Patients Only) Pre-Morbid Rankin Score: No symptoms Modified Rankin: Moderately severe disability     Balance Overall balance assessment: Needs assistance Sitting-balance support: Feet supported Sitting balance-Leahy Scale: Good       Standing balance-Leahy Scale: Fair Standing balance comment: no physical assist required             High level balance activites: Direction changes;Turns;Head turns High Level Balance Comments: min guard for safety             Pertinent Vitals/Pain Pain Assessment: No/denies pain    Home Living Family/patient expects to be discharged to:: Private residence Living Arrangements: Spouse/significant other Available Help at Discharge: Family (available initially 24/7 for a few days) Type of Home: House Home Access: Level entry     Home Layout: One level Home Equipment: None      Prior Function Level of Independence: Independent               Hand Dominance   Dominant Hand: Right    Extremity/Trunk Assessment   Upper Extremity Assessment Upper Extremity Assessment: Defer to OT evaluation    Lower Extremity Assessment Lower Extremity Assessment: LLE deficits/detail LLE Deficits / Details: left with very modest assymetrical weakness but improved from admission per patient LLE Coordination: decreased gross motor       Communication   Communication: HOH  Cognition Arousal/Alertness: Awake/alert Behavior During Therapy: WFL for tasks assessed/performed Overall Cognitive Status: History of cognitive impairments - at baseline (per wife has had some memory deficits) Area of Impairment: Memory;Attention;Safety/judgement  Current Attention Level: Selective Memory: Decreased short-term memory   Safety/Judgement: Decreased awareness of deficits;Decreased awareness of safety     General Comments: Patient required continued cues for safety       General Comments       Exercises     Assessment/Plan    PT Assessment Patient needs continued PT services  PT Problem List Decreased strength;Decreased activity tolerance;Decreased balance;Decreased mobility;Decreased cognition;Decreased safety awareness       PT Treatment Interventions DME instruction;Gait training;Functional mobility training;Therapeutic activities;Therapeutic exercise;Balance training;Patient/family education    PT Goals (Current goals can be found in the Care Plan section)  Acute Rehab PT Goals Patient Stated Goal: to go home PT Goal Formulation: With patient/family Time For Goal Achievement: 05/14/17 Potential to Achieve Goals: Good    Frequency Min 4X/week   Barriers to discharge Decreased caregiver support wife available initially    Co-evaluation               AM-PAC PT "6 Clicks" Daily Activity  Outcome Measure Difficulty turning over in bed (including adjusting bedclothes, sheets and blankets)?: A Little Difficulty moving from lying on back to sitting on the side of the bed? : A Little Difficulty sitting down on and standing up from a chair with arms (e.g., wheelchair, bedside commode, etc,.)?: A Little Help needed moving to and from a bed to chair (including a wheelchair)?: A Little Help needed walking in hospital room?: A Little Help needed climbing 3-5 steps with a railing? : A Lot 6 Click Score: 17    End of Session Equipment Utilized During Treatment: Gait belt Activity Tolerance: Patient tolerated treatment well Patient left: in chair;with call bell/phone within reach;with chair alarm set;with family/visitor present Nurse Communication: Mobility status PT Visit Diagnosis: Unsteadiness on feet (R26.81);Difficulty in walking, not elsewhere classified (R26.2);Other symptoms and signs involving the nervous system (R29.898)    Time: 5366-4403 PT Time Calculation (min) (ACUTE ONLY): 22 min   Charges:   PT Evaluation $PT Eval Moderate Complexity: 1  Mod     PT G Codes:        Charlotte Crumb, PT DPT  Board Certified Neurologic Specialist [phone]   Fabio Asa 04/30/2017, 4:38 PM

## 2017-04-30 NOTE — Progress Notes (Signed)
Referring Physician(s): Code stroke  Supervising Physician: Gilmer MorWagner, Jaime  [facility]  [facility]  Chief Complaint:  Stroke  Subjective:  [name] is sitting up in bed. He moves his feet around a lot like he is agitated but states he is ok.  He says "I'm doing better. I've go a long way to go though".   Allergies: Patient has no allergy information on record.  Medications: Prior to Admission medications   Medication Sig Start Date End Date Taking? Authorizing Provider  aspirin EC 81 MG tablet Take 81 mg by mouth daily.   Yes [provider]  carvedilol (COREG) 25 MG tablet Take 25 mg by mouth 2 (two) times daily. 02/26/17  Yes [provider]  cyclobenzaprine (FLEXERIL) 10 MG tablet Take 10 mg by mouth 2 (two) times daily as needed. 04/11/17  Yes [provider]  glipiZIDE (GLUCOTROL XL) 10 MG 24 hr tablet Take 10 mg by mouth daily. 03/30/17  Yes [provider]  lisinopril (PRINIVIL,ZESTRIL) 5 MG tablet Take 5 mg by mouth daily. 10/12/16  Yes [provider]  meclizine (ANTIVERT) 25 MG tablet Take 25 mg by mouth daily as needed. 04/07/17  Yes [provider]  meloxicam (MOBIC) 15 MG tablet Take 15 mg by mouth daily as needed. 04/11/17  Yes [provider]  mupirocin ointment (BACTROBAN) 2 % Apply 1 application topically daily as needed. 04/11/17  Yes [provider]  naproxen sodium (ALEVE) 220 MG tablet Take 220 mg by mouth daily as needed (pain).   Yes [provider]  repaglinide (PRANDIN) 0.5 MG tablet Take 0.5 tablets by mouth 3 (three) times daily with meals. 01/27/17  Yes [provider]  simvastatin (ZOCOR) 40 MG tablet Take 40 mg by mouth at bedtime.  01/27/17  Yes [provider]  tamsulosin (FLOMAX) 0.4 MG CAPS capsule Take 0.4 mg by mouth daily. 03/30/17  Yes [provider]     Vital Signs: BP 116/61 (BP Location: Left Arm)   Pulse 82   Temp 98.4 F  (36.9 C) (Oral)   Resp 16   Ht 5\' 11"  (1.803 m)   Wt 248 lb 7.3 oz (112.7 kg)   SpO2 96%   BMI 34.65 kg/m   Physical Exam Awake and alert NAD Tongue appears to be midline Still with mild left facial droop Mild ataxia on the left UE and LE Good LE strength bilaterally 5/5 RUE 5/5, LUE 3/5 Left hand if very edematous and ecchymotic  Right groin with clean bulky dressings in place. Groin is soft, non-tender  Imaging: Ct Head Wo Contrast  Result Date: 04/29/2017 CLINICAL DATA:  71 y/o  M; stroke follow-up. EXAM: CT HEAD WITHOUT CONTRAST TECHNIQUE: Contiguous axial images were obtained from the base of the skull through the vertex without intravenous contrast. COMPARISON:  04/29/2017 CT head and CT perfusion head. FINDINGS: Brain: Area of hypoattenuation within the right superior insula and frontal operculum measuring 2.1 x 1.9 x 2.2 cm (volume = 4.6 cm^3) compatible infarction within the region of perfusion anomaly on prior CT. No new acute intracranial hemorrhage, infarction, or significant mass effect identified. Background of mild chronic microvascular ischemic changes and parenchymal volume loss. Vascular: Mild calcific atherosclerosis of carotid siphons. No hyperdense vessel identified. Skull: Normal. Negative for fracture or focal lesion. Sinuses/Orbits: No acute finding. Other: None. IMPRESSION: 1. Evolving infarction and right superior insula and frontal operculum measuring 4.6 cc within the region of perfusion anomaly on prior CT  perfusion. 2. No new large territory acute infarction, hemorrhage, or mass effect. 3. Mild chronic microvascular ischemic changes and parenchymal volume loss of the brain. Electronically Signed   By: Mitzi Hansen M.D.   On: 04/29/2017 19:05   Ct Head Wo Contrast  Result Date: 04/29/2017 CLINICAL DATA:  Right MCA stroke with percutaneous intervention. EXAM: CT HEAD WITHOUT CONTRAST TECHNIQUE: Contiguous axial images were obtained from the base of  the skull through the vertex without intravenous contrast. COMPARISON:  Cerebral angiogram 04/29/2017 Head CT 04/29/2017 FINDINGS: Brain: There is no intracranial hemorrhage. Gray-white differentiation is preserved. Basal ganglia and insular ribbons are preserved. Vascular: No hyperdense vessel or unexpected calcification.There is mild intravascular enhancement due to presence of circulating contrast. Skull: Normal visualized skull base, calvarium and extracranial soft tissues. Sinuses/Orbits: No sinus fluid levels or advanced mucosal thickening. No mastoid effusion. Normal orbits. IMPRESSION: No intracranial hemorrhage or cytotoxic edema following vascular intervention. Electronically Signed   By: Deatra Robinson M.D.   On: 04/29/2017 04:05   [name] Head Wo Contrast  Result Date: 04/30/2017 CLINICAL DATA:  Follow-up acute RIGHT MCA stroke and mechanical thrombectomy of MCA occlusion. EXAM: MRI HEAD WITHOUT CONTRAST MRA HEAD WITHOUT CONTRAST TECHNIQUE: Multiplanar, multiecho pulse sequences of the brain and surrounding structures were obtained without intravenous contrast. Angiographic images of the head were obtained using MRA technique without contrast. COMPARISON:  CT HEAD April 29, 2017 and CT angiogram of the head April 28, 2017 FINDINGS: MRI HEAD FINDINGS- mildly motion degraded examination. BRAIN: Patchy RIGHT frontal reduced diffusion, 1.5 x 2 cm confluent component RIGHT insula with low ADC values and susceptibility artifact within the insular component. LEFT frontal lobe chronic microhemorrhage. Patchy supratentorial white matter FLAIR T2 hyperintensities exclusive of aforementioned abnormality. Ventricles and sulci are normal for patient's age. No midline shift, mass effect or masses. No abnormal extra-axial fluid collections. VASCULAR: See below. SKULL AND UPPER CERVICAL SPINE: No abnormal sellar expansion. No suspicious calvarial bone marrow signal. Craniocervical junction maintained.  SINUSES/ORBITS: The mastoid air-cells and included paranasal sinuses are well-aerated. The included ocular globes and orbital contents are non-suspicious. OTHER: None. MRA HEAD FINDINGS- moderately motion degraded examination. ANTERIOR CIRCULATION: Normal flow related enhancement of the included cervical, petrous, cavernous and supraclinoid internal carotid arteries. Patent anterior communicating artery. Patent anterior and middle cerebral arteries, including distal segments. Focal stenosis versus motion artifact RIGHT M2 origin seen on maximum intensity projected reformations. No large vessel occlusion, flow limiting stenosis. POSTERIOR CIRCULATION: LEFT vertebral artery is dominant. Basilar artery is patent, with normal flow related enhancement of the main branch vessels. Patent posterior cerebral arteries. Bilateral posterior communicating artery's present. No large vessel occlusion, flow limiting stenosis. ANATOMIC VARIANTS: None. Source images and MIP images were reviewed. IMPRESSION: MRI HEAD: 1. Mildly motion degraded examination. 2. Acute patchy RIGHT frontal lobe infarct, with petechial hemorrhage within insular component. 3. Mild to moderate chronic small vessel ischemic disease. MRA HEAD: 1. Moderately motion degraded examination. 2. No emergent large vessel occlusion or flow-limiting stenosis. 3. Focal stenosis versus motion artifact RIGHT M2 origin. Electronically Signed   By: Awilda Metro M.D.   On: 04/30/2017 04:32   Mr Brain Wo Contrast  Result Date: 04/30/2017 CLINICAL DATA:  Follow-up acute RIGHT MCA stroke and mechanical thrombectomy of MCA occlusion. EXAM: MRI HEAD WITHOUT CONTRAST MRA HEAD WITHOUT CONTRAST TECHNIQUE: Multiplanar, multiecho pulse sequences of the brain and surrounding structures were obtained without intravenous contrast. Angiographic images of the head were obtained using MRA technique without contrast. COMPARISON:  CT HEAD April 29, 2017 and CT angiogram of the head  April 28, 2017 FINDINGS: MRI HEAD FINDINGS- mildly motion degraded examination. BRAIN: Patchy RIGHT frontal reduced diffusion, 1.5 x 2 cm confluent component RIGHT insula with low ADC values and susceptibility artifact within the insular component. LEFT frontal lobe chronic microhemorrhage. Patchy supratentorial white matter FLAIR T2 hyperintensities exclusive of aforementioned abnormality. Ventricles and sulci are normal for patient's age. No midline shift, mass effect or masses. No abnormal extra-axial fluid collections. VASCULAR: See below. SKULL AND UPPER CERVICAL SPINE: No abnormal sellar expansion. No suspicious calvarial bone marrow signal. Craniocervical junction maintained. SINUSES/ORBITS: The mastoid air-cells and included paranasal sinuses are well-aerated. The included ocular globes and orbital contents are non-suspicious. OTHER: None. MRA HEAD FINDINGS- moderately motion degraded examination. ANTERIOR CIRCULATION: Normal flow related enhancement of the included cervical, petrous, cavernous and supraclinoid internal carotid arteries. Patent anterior communicating artery. Patent anterior and middle cerebral arteries, including distal segments. Focal stenosis versus motion artifact RIGHT M2 origin seen on maximum intensity projected reformations. No large vessel occlusion, flow limiting stenosis. POSTERIOR CIRCULATION: LEFT vertebral artery is dominant. Basilar artery is patent, with normal flow related enhancement of the main branch vessels. Patent posterior cerebral arteries. Bilateral posterior communicating artery's present. No large vessel occlusion, flow limiting stenosis. ANATOMIC VARIANTS: None. Source images and MIP images were reviewed. IMPRESSION: MRI HEAD: 1. Mildly motion degraded examination. 2. Acute patchy RIGHT frontal lobe infarct, with petechial hemorrhage within insular component. 3. Mild to moderate chronic small vessel ischemic disease. MRA HEAD: 1. Moderately motion degraded  examination. 2. No emergent large vessel occlusion or flow-limiting stenosis. 3. Focal stenosis versus motion artifact RIGHT M2 origin. Electronically Signed   By: Awilda Metro M.D.   On: 04/30/2017 04:32   Ir US Guide Vasc Access Left  Result Date: 04/29/2017 INDICATION: 71 year old male with a history of acute right MCA stroke, left-sided symptoms, normal baseline, with stroke scale of 13, aspects 10, within the window for tPA. He presents for angiogram and mechanical thrombectomy as a treatment for emergent large vessel occlusion. EXAM: ULTRASOUND GUIDED ACCESS RIGHT COMMON FEMORAL ARTERY CEREBRAL ANGIOGRAM MECHANICAL THROMBECTOMY RIGHT MCA BRANCHES CLOSURE OF RIGHT COMMON FEMORAL ARTERY ACCESS WITH SUTURE MEDIATED DEVICE COMPARISON:  CT IMAGING OF THE SAME DAY MEDICATIONS: 2.0 g Ancef. The antibiotic was administered within 1 hour of the procedure ANESTHESIA/SEDATION: General anesthesia with anesthesia team CONTRAST:  75 cc Isovue 300 FLUOROSCOPY TIME:  Fluoroscopy Time: 8 minutes 24 seconds (707 mGy). COMPLICATIONS: None TECHNIQUE: Informed written consent was obtained from the patient's family after a thorough discussion of the procedural risks, benefits and alternatives. Specific risks discussed include: Bleeding, infection, contrast reaction, kidney injury/failure, need for further procedure/surgery, arterial injury or dissection, embolization to new territory, intracranial hemorrhage (10-15% risk), neurologic deterioration, cardiopulmonary collapse, death. All questions were addressed. Maximal Sterile Barrier Technique was utilized including during the procedure including caps, mask, sterile gowns, sterile gloves, sterile drape, hand hygiene and skin antiseptic. A timeout was performed prior to the initiation of the procedure. FINDINGS: Initial: Right common carotid artery:  Normal course caliber and contour. Right external carotid artery: Patent with antegrade flow. Right internal carotid artery:  Mild atherosclerotic changes with plaque at the carotid bifurcation. No significant stenosis at the bifurcation. Normal course caliber and contour of the cervical portion. Vertical and petrous segment patent with normal course caliber contour. Cavernous segment patent. Clinoid segment patent. Antegrade flow of the ophthalmic artery. Ophthalmic segment patent. Terminus patent. Right MCA: M1 segment is  patent. There is an early arising temporal branch, with downward course. Inferior division patent on the initial image. There is proximal occlusion of the superior and dominant segment. Right ACA: A 1 segment patent. A 2 segment perfuses the right territory. Final: Right MCA: Restoration of flow through the superior and dominant division, TICI 3 PROCEDURE: Patient was brought to the interventional suite, and identification of the patient was confirmed. Patient was placed supine position with gentle anesthesia initiated with the anesthesia team. The patient is then prepped and draped in the usual sterile fashion. Ultrasound survey of the right inguinal region was performed with images stored and sent to PACs. A micropuncture needle was used access the right common femoral artery under ultrasound. With excellent arterial blood flow returned, and an .018 micro wire was passed through the needle, observed enter the abdominal aorta under fluoroscopy. The needle was removed, and a micropuncture sheath was placed over the wire. The inner dilator and wire were removed, and an 035 Bentson wire was advanced under fluoroscopy into the abdominal aorta. The sheath was removed and a standard 5 [place] vascular sheath was placed. The dilator was removed and the sheath was flushed. Ultrasound survey of the left inguinal region was then performed with images stored and sent to PACs. A micropuncture needle was used access the left common femoral artery under ultrasound. With excellent arterial blood flow returned, an .018 micro wire was  passed through the needle, observed to enter the abdominal aorta under fluoroscopy. The needle was removed, and a micropuncture sheath was placed over the wire. The inner dilator and wire were removed, and an 035 Bentson wire was advanced under fluoroscopy into the abdominal aorta. The sheath was removed and a standard 4 [place] vascular sheath was placed for arterial monitoring. Dilator was removed and the sheath was flushed and attached to the transducer. A 29F JB-1 diagnostic catheter was advanced over the wire to the proximal descending thoracic aorta. Wire was then removed. Double flush of the catheter was performed. Catheter was then used to select the innominate artery. Angiogram was performed. Using roadmap technique, the catheter was advanced over a roadrunner wire into right common carotid artery. Formal angiogram was performed. Exchange length Rosen wire was then passed through the diagnostic catheter to the distal common carotid artery and the diagnostic catheter was removed. The 5 French sheath was removed and exchanged for 8 French 55 centimeter BrightTip sheath. Sheath was flushed and attached to pressurized and heparinized saline bag for constant forward flow. Then an 8 [place], 85 cm Flowgate balloon tip catheter was prepared on the back table with inflation of the balloon with 50/50 concentration of dilute contrast. The balloon catheter was then advanced over the wire, positioned into the distal common carotid artery. Copious back flush was performed and the balloon catheter was attached to heparinized and pressurized saline bag for forward flow. Flowgate balloon catheter was then advanced over the road runner wire into the mid internal carotid artery. Angiogram was performed for road map guide. Microcatheter system was then introduced through the balloon guide catheter, using a synchro soft 014 wire and a Trevo Provue18 catheter. Microcatheter system was advanced into the internal carotid artery, to  the level of the occlusion. The micro wire was then carefully advanced through the occluded segment. Microcatheter was then push through the occluded segment and the wire was removed. Blood was then aspirated through the hub of the microcatheter, and a gentle contrast injection was performed confirming intraluminal position. A rotating  hemostatic valve was then attached to the back end of the microcatheter, and a pressurized and heparinized saline bag was attached to the catheter. 4 x 40 solitaire device was then selected. Back flush was achieved at the rotating hemostatic valve, and then the device was gently advanced through the microcatheter to the distal end. The retriever was then unsheathed by withdrawing the microcatheter under fluoroscopy. Once the retriever was completely unsheathed, control angiogram was performed from the balloon catheter. The balloon at the balloon tip catheter was then inflated under fluoroscopy for proximal flow arrest. Constant aspiration was then performed at the tip of the balloon guide catheter as the retriever was gently and slowly withdrawn with fluoroscopic observation. Once the retriever was entirely removed from the system, free aspiration was confirmed at the hub of the balloon guide catheter, with free blood return confirmed. The balloon was then deflated, rotating hemostatic valve was reattached, and a control angiogram was performed. Control angiogram was performed at the right common femoral artery puncture site. Two overlapping suture mediated devices were used for right common femoral artery closure. Patient tolerated the procedure well and remained hemodynamically stable throughout. No complications were encountered and no significant blood loss encountered. IMPRESSION: Status post cerebral angiogram and mechanical thrombectomy of proximal occlusion of the superior and dominant MCA branch of the right hemisphere with restoration of TICI 3 flow. Suture mediated closure  of right common femoral artery. Signed, Yvone Neu. Loreta Ave, DO [facility] PLAN: Extubated in the interventional suite CT post procedure ICU admission Right leg straight until 8 a.m. Electronically Signed   By: Gilmer Mor D.O.   On: 04/29/2017 04:07   Ir US Guide Vasc Access Right  Result Date: 04/29/2017 INDICATION: 71 year old male with a history of acute right MCA stroke, left-sided symptoms, normal baseline, with stroke scale of 13, aspects 10, within the window for tPA. He presents for angiogram and mechanical thrombectomy as a treatment for emergent large vessel occlusion. EXAM: ULTRASOUND GUIDED ACCESS RIGHT COMMON FEMORAL ARTERY CEREBRAL ANGIOGRAM MECHANICAL THROMBECTOMY RIGHT MCA BRANCHES CLOSURE OF RIGHT COMMON FEMORAL ARTERY ACCESS WITH SUTURE MEDIATED DEVICE COMPARISON:  CT IMAGING OF THE SAME DAY MEDICATIONS: 2.0 g Ancef. The antibiotic was administered within 1 hour of the procedure ANESTHESIA/SEDATION: General anesthesia with anesthesia team CONTRAST:  75 cc Isovue 300 FLUOROSCOPY TIME:  Fluoroscopy Time: 8 minutes 24 seconds (707 mGy). COMPLICATIONS: None TECHNIQUE: Informed written consent was obtained from the patient's family after a thorough discussion of the procedural risks, benefits and alternatives. Specific risks discussed include: Bleeding, infection, contrast reaction, kidney injury/failure, need for further procedure/surgery, arterial injury or dissection, embolization to new territory, intracranial hemorrhage (10-15% risk), neurologic deterioration, cardiopulmonary collapse, death. All questions were addressed. Maximal Sterile Barrier Technique was utilized including during the procedure including caps, mask, sterile gowns, sterile gloves, sterile drape, hand hygiene and skin antiseptic. A timeout was performed prior to the initiation of the procedure. FINDINGS: Initial: Right common carotid artery:  Normal course caliber and  contour. Right external carotid artery: Patent with antegrade flow. Right internal carotid artery: Mild atherosclerotic changes with plaque at the carotid bifurcation. No significant stenosis at the bifurcation. Normal course caliber and contour of the cervical portion. Vertical and petrous segment patent with normal course caliber contour. Cavernous segment patent. Clinoid segment patent. Antegrade flow of the ophthalmic artery. Ophthalmic segment patent. Terminus patent. Right MCA: M1 segment is patent. There is an early arising temporal branch, with downward course. Inferior division patent  on the initial image. There is proximal occlusion of the superior and dominant segment. Right ACA: A 1 segment patent. A 2 segment perfuses the right territory. Final: Right MCA: Restoration of flow through the superior and dominant division, TICI 3 PROCEDURE: Patient was brought to the interventional suite, and identification of the patient was confirmed. Patient was placed supine position with gentle anesthesia initiated with the anesthesia team. The patient is then prepped and draped in the usual sterile fashion. Ultrasound survey of the right inguinal region was performed with images stored and sent to PACs. A micropuncture needle was used access the right common femoral artery under ultrasound. With excellent arterial blood flow returned, and an .018 micro wire was passed through the needle, observed enter the abdominal aorta under fluoroscopy. The needle was removed, and a micropuncture sheath was placed over the wire. The inner dilator and wire were removed, and an 035 Bentson wire was advanced under fluoroscopy into the abdominal aorta. The sheath was removed and a standard 5 [place] vascular sheath was placed. The dilator was removed and the sheath was flushed. Ultrasound survey of the left inguinal region was then performed with images stored and sent to PACs. A micropuncture needle was used access the left common  femoral artery under ultrasound. With excellent arterial blood flow returned, an .018 micro wire was passed through the needle, observed to enter the abdominal aorta under fluoroscopy. The needle was removed, and a micropuncture sheath was placed over the wire. The inner dilator and wire were removed, and an 035 Bentson wire was advanced under fluoroscopy into the abdominal aorta. The sheath was removed and a standard 4 [place] vascular sheath was placed for arterial monitoring. Dilator was removed and the sheath was flushed and attached to the transducer. A 24F JB-1 diagnostic catheter was advanced over the wire to the proximal descending thoracic aorta. Wire was then removed. Double flush of the catheter was performed. Catheter was then used to select the innominate artery. Angiogram was performed. Using roadmap technique, the catheter was advanced over a roadrunner wire into right common carotid artery. Formal angiogram was performed. Exchange length Rosen wire was then passed through the diagnostic catheter to the distal common carotid artery and the diagnostic catheter was removed. The 5 French sheath was removed and exchanged for 8 French 55 centimeter BrightTip sheath. Sheath was flushed and attached to pressurized and heparinized saline bag for constant forward flow. Then an 8 [place], 85 cm Flowgate balloon tip catheter was prepared on the back table with inflation of the balloon with 50/50 concentration of dilute contrast. The balloon catheter was then advanced over the wire, positioned into the distal common carotid artery. Copious back flush was performed and the balloon catheter was attached to heparinized and pressurized saline bag for forward flow. Flowgate balloon catheter was then advanced over the road runner wire into the mid internal carotid artery. Angiogram was performed for road map guide. Microcatheter system was then introduced through the balloon guide catheter, using a synchro soft 014 wire  and a Trevo Provue18 catheter. Microcatheter system was advanced into the internal carotid artery, to the level of the occlusion. The micro wire was then carefully advanced through the occluded segment. Microcatheter was then push through the occluded segment and the wire was removed. Blood was then aspirated through the hub of the microcatheter, and a gentle contrast injection was performed confirming intraluminal position. A rotating hemostatic valve was then attached to the back end of the microcatheter, and a  pressurized and heparinized saline bag was attached to the catheter. 4 x 40 solitaire device was then selected. Back flush was achieved at the rotating hemostatic valve, and then the device was gently advanced through the microcatheter to the distal end. The retriever was then unsheathed by withdrawing the microcatheter under fluoroscopy. Once the retriever was completely unsheathed, control angiogram was performed from the balloon catheter. The balloon at the balloon tip catheter was then inflated under fluoroscopy for proximal flow arrest. Constant aspiration was then performed at the tip of the balloon guide catheter as the retriever was gently and slowly withdrawn with fluoroscopic observation. Once the retriever was entirely removed from the system, free aspiration was confirmed at the hub of the balloon guide catheter, with free blood return confirmed. The balloon was then deflated, rotating hemostatic valve was reattached, and a control angiogram was performed. Control angiogram was performed at the right common femoral artery puncture site. Two overlapping suture mediated devices were used for right common femoral artery closure. Patient tolerated the procedure well and remained hemodynamically stable throughout. No complications were encountered and no significant blood loss encountered. IMPRESSION: Status post cerebral angiogram and mechanical thrombectomy of proximal occlusion of the superior and  dominant MCA branch of the right hemisphere with restoration of TICI 3 flow. Suture mediated closure of right common femoral artery. Signed, Yvone Neu. Loreta Ave, DO [facility] Radiology PLAN: Extubated in the interventional suite CT post procedure ICU admission Right leg straight until 8 a.m. Electronically Signed   By: Gilmer Mor D.O.   On: 04/29/2017 04:07   Ct Cerebral Perfusion W Contrast  Result Date: 04/29/2017 CLINICAL DATA:  Code stroke. EXAM: CT HEAD WITHOUT CONTRAST CT CEREBRAL PERFUSION WITH CONTRAST TECHNIQUE: Contiguous axial images were obtained from the base of the skull through the vertex without intravenous contrast. Following the administration of contrast material, CT perfusion scanning of the brain was performed. COMPARISON:  None. FINDINGS: Brain: No mass lesion or acute hemorrhage. No focal hypoattenuation of the basal ganglia or cortex to indicate infarcted tissue. There is periventricular hypoattenuation compatible with chronic microvascular disease. No hydrocephalus or age advanced atrophy. Vascular: No hyperdense vessel. No advanced atherosclerotic calcification of the arteries at the skull base. Skull: Normal visualized skull base, calvarium and extracranial soft tissues. Sinuses/Orbits: No sinus fluid levels or advanced mucosal thickening. No mastoid effusion. Normal orbits. ASPECTS (Alberta Stroke Program Early CT Score) - Ganglionic level infarction (caudate, lentiform nuclei, internal capsule, insula, M1-M3 cortex): 7 - Supraganglionic infarction (M4-M6 cortex): 3 Total score (0-10 with 10 being normal): 10 CT Brain Perfusion Findings: CBF (<30%) Volume: 0mL Perfusion (Tmax>6.0s) volume: 63mL Mismatch Volume: 63mL Ischemia Location:Anterior right MCA distribution IMPRESSION: 1. No acute hemorrhage or mass lesion. 2. ASPECTS is 10. 3. Elevated mean transit time within the anterior right MCA distribution without associated abnormal  cerebral blood volume or cerebral blood flow likely indicates an area of acute ischemia without infarction. These results were called by telephone at the time of interpretation on 04/29/2017 at 12:55 am to Dr. Ritta Slot , who verbally acknowledged these results. Electronically Signed   By: Deatra Robinson M.D.   On: 04/29/2017 01:16   Dg Chest Port 1 View  Result Date: 04/30/2017 CLINICAL DATA:  Intracranial hemorrhage. EXAM: PORTABLE CHEST 1 VIEW COMPARISON:  Yesterday. FINDINGS: Normal sized heart. Clear lungs with decreased prominence of the pulmonary vasculature. Improved inspiration. Stable enlarged cardiac silhouette. Unremarkable bones. IMPRESSION: Stable cardiomegaly with improved pulmonary vascular congestion, at  least partly due to an improved inspiration. Electronically Signed   By: Beckie Salts M.D.   On: 04/30/2017 08:00   Dg Chest Port 1 View  Result Date: 04/29/2017 CLINICAL DATA:  Acute respiratory failure.  Stroke. EXAM: PORTABLE CHEST 1 VIEW COMPARISON:  Chest yesterday radiograph at [facility]. FINDINGS: Cardiomegaly accentuated by lower lung volumes. Crowding of bronchovascular structures with mild vascular congestion. Minimal retrocardiac atelectasis. No pleural fluid or pneumothorax. IMPRESSION: Cardiomegaly with mild vascular congestion. Electronically Signed   By: Rubye Oaks M.D.   On: 04/29/2017 05:32   Ir Percutaneous Art Thrombectomy/infusion Intracranial Inc Diag Angio  Result Date: 04/29/2017 INDICATION: 71 year old male with a history of acute right MCA stroke, left-sided symptoms, normal baseline, with stroke scale of 13, aspects 10, within the window for tPA. He presents for angiogram and mechanical thrombectomy as a treatment for emergent large vessel occlusion. EXAM: ULTRASOUND GUIDED ACCESS RIGHT COMMON FEMORAL ARTERY CEREBRAL ANGIOGRAM MECHANICAL THROMBECTOMY RIGHT MCA BRANCHES CLOSURE OF RIGHT COMMON FEMORAL ARTERY ACCESS WITH SUTURE MEDIATED  DEVICE COMPARISON:  CT IMAGING OF THE SAME DAY MEDICATIONS: 2.0 g Ancef. The antibiotic was administered within 1 hour of the procedure ANESTHESIA/SEDATION: General anesthesia with anesthesia team CONTRAST:  75 cc Isovue 300 FLUOROSCOPY TIME:  Fluoroscopy Time: 8 minutes 24 seconds (707 mGy). COMPLICATIONS: None TECHNIQUE: Informed written consent was obtained from the patient's family after a thorough discussion of the procedural risks, benefits and alternatives. Specific risks discussed include: Bleeding, infection, contrast reaction, kidney injury/failure, need for further procedure/surgery, arterial injury or dissection, embolization to new territory, intracranial hemorrhage (10-15% risk), neurologic deterioration, cardiopulmonary collapse, death. All questions were addressed. Maximal Sterile Barrier Technique was utilized including during the procedure including caps, mask, sterile gowns, sterile gloves, sterile drape, hand hygiene and skin antiseptic. A timeout was performed prior to the initiation of the procedure. FINDINGS: Initial: Right common carotid artery:  Normal course caliber and contour. Right external carotid artery: Patent with antegrade flow. Right internal carotid artery: Mild atherosclerotic changes with plaque at the carotid bifurcation. No significant stenosis at the bifurcation. Normal course caliber and contour of the cervical portion. Vertical and petrous segment patent with normal course caliber contour. Cavernous segment patent. Clinoid segment patent. Antegrade flow of the ophthalmic artery. Ophthalmic segment patent. Terminus patent. Right MCA: M1 segment is patent. There is an early arising temporal branch, with downward course. Inferior division patent on the initial image. There is proximal occlusion of the superior and dominant segment. Right ACA: A 1 segment patent. A 2 segment perfuses the right territory. Final: Right MCA: Restoration of flow through the superior and dominant  division, TICI 3 PROCEDURE: Patient was brought to the interventional suite, and identification of the patient was confirmed. Patient was placed supine position with gentle anesthesia initiated with the anesthesia team. The patient is then prepped and draped in the usual sterile fashion. Ultrasound survey of the right inguinal region was performed with images stored and sent to PACs. A micropuncture needle was used access the right common femoral artery under ultrasound. With excellent arterial blood flow returned, and an .018 micro wire was passed through the needle, observed enter the abdominal aorta under fluoroscopy. The needle was removed, and a micropuncture sheath was placed over the wire. The inner dilator and wire were removed, and an 035 Bentson wire was advanced under fluoroscopy into the abdominal aorta. The sheath was removed and a standard 5 [place] vascular sheath was placed. The dilator was removed and the sheath was flushed.  Ultrasound survey of the left inguinal region was then performed with images stored and sent to PACs. A micropuncture needle was used access the left common femoral artery under ultrasound. With excellent arterial blood flow returned, an .018 micro wire was passed through the needle, observed to enter the abdominal aorta under fluoroscopy. The needle was removed, and a micropuncture sheath was placed over the wire. The inner dilator and wire were removed, and an 035 Bentson wire was advanced under fluoroscopy into the abdominal aorta. The sheath was removed and a standard 4 [place] vascular sheath was placed for arterial monitoring. Dilator was removed and the sheath was flushed and attached to the transducer. A 54F JB-1 diagnostic catheter was advanced over the wire to the proximal descending thoracic aorta. Wire was then removed. Double flush of the catheter was performed. Catheter was then used to select the innominate artery. Angiogram was performed. Using roadmap technique,  the catheter was advanced over a roadrunner wire into right common carotid artery. Formal angiogram was performed. Exchange length Rosen wire was then passed through the diagnostic catheter to the distal common carotid artery and the diagnostic catheter was removed. The 5 French sheath was removed and exchanged for 8 French 55 centimeter BrightTip sheath. Sheath was flushed and attached to pressurized and heparinized saline bag for constant forward flow. Then an 8 [place], 85 cm Flowgate balloon tip catheter was prepared on the back table with inflation of the balloon with 50/50 concentration of dilute contrast. The balloon catheter was then advanced over the wire, positioned into the distal common carotid artery. Copious back flush was performed and the balloon catheter was attached to heparinized and pressurized saline bag for forward flow. Flowgate balloon catheter was then advanced over the road runner wire into the mid internal carotid artery. Angiogram was performed for road map guide. Microcatheter system was then introduced through the balloon guide catheter, using a synchro soft 014 wire and a Trevo Provue18 catheter. Microcatheter system was advanced into the internal carotid artery, to the level of the occlusion. The micro wire was then carefully advanced through the occluded segment. Microcatheter was then push through the occluded segment and the wire was removed. Blood was then aspirated through the hub of the microcatheter, and a gentle contrast injection was performed confirming intraluminal position. A rotating hemostatic valve was then attached to the back end of the microcatheter, and a pressurized and heparinized saline bag was attached to the catheter. 4 x 40 solitaire device was then selected. Back flush was achieved at the rotating hemostatic valve, and then the device was gently advanced through the microcatheter to the distal end. The retriever was then unsheathed by withdrawing the  microcatheter under fluoroscopy. Once the retriever was completely unsheathed, control angiogram was performed from the balloon catheter. The balloon at the balloon tip catheter was then inflated under fluoroscopy for proximal flow arrest. Constant aspiration was then performed at the tip of the balloon guide catheter as the retriever was gently and slowly withdrawn with fluoroscopic observation. Once the retriever was entirely removed from the system, free aspiration was confirmed at the hub of the balloon guide catheter, with free blood return confirmed. The balloon was then deflated, rotating hemostatic valve was reattached, and a control angiogram was performed. Control angiogram was performed at the right common femoral artery puncture site. Two overlapping suture mediated devices were used for right common femoral artery closure. Patient tolerated the procedure well and remained hemodynamically stable throughout. No complications were encountered and  no significant blood loss encountered. IMPRESSION: Status post cerebral angiogram and mechanical thrombectomy of proximal occlusion of the superior and dominant MCA branch of the right hemisphere with restoration of TICI 3 flow. Suture mediated closure of right common femoral artery. Signed, Yvone Neu. Loreta Ave, DO [facility] Radiology PLAN: Extubated in the interventional suite CT post procedure ICU admission Right leg straight until 8 a.m. Electronically Signed   By: Gilmer Mor D.O.   On: 04/29/2017 04:07   Ct Head Code Stroke Wo Contrast  Result Date: 04/29/2017 CLINICAL DATA:  Code stroke. EXAM: CT HEAD WITHOUT CONTRAST CT CEREBRAL PERFUSION WITH CONTRAST TECHNIQUE: Contiguous axial images were obtained from the base of the skull through the vertex without intravenous contrast. Following the administration of contrast material, CT perfusion scanning of the brain was performed. COMPARISON:  None. FINDINGS:  Brain: No mass lesion or acute hemorrhage. No focal hypoattenuation of the basal ganglia or cortex to indicate infarcted tissue. There is periventricular hypoattenuation compatible with chronic microvascular disease. No hydrocephalus or age advanced atrophy. Vascular: No hyperdense vessel. No advanced atherosclerotic calcification of the arteries at the skull base. Skull: Normal visualized skull base, calvarium and extracranial soft tissues. Sinuses/Orbits: No sinus fluid levels or advanced mucosal thickening. No mastoid effusion. Normal orbits. ASPECTS (Alberta Stroke Program Early CT Score) - Ganglionic level infarction (caudate, lentiform nuclei, internal capsule, insula, M1-M3 cortex): 7 - Supraganglionic infarction (M4-M6 cortex): 3 Total score (0-10 with 10 being normal): 10 CT Brain Perfusion Findings: CBF (<30%) Volume: 0mL Perfusion (Tmax>6.0s) volume: 63mL Mismatch Volume: 63mL Ischemia Location:Anterior right MCA distribution IMPRESSION: 1. No acute hemorrhage or mass lesion. 2. ASPECTS is 10. 3. Elevated mean transit time within the anterior right MCA distribution without associated abnormal cerebral blood volume or cerebral blood flow likely indicates an area of acute ischemia without infarction. These results were called by telephone at the time of interpretation on 04/29/2017 at 12:55 am to Dr. Ritta Slot , who verbally acknowledged these results. Electronically Signed   By: Deatra Robinson M.D.   On: 04/29/2017 01:16    Labs:  CBC:  Recent Labs  04/29/17 0112 04/30/17 0428  WBC 14.3* 21.6*  HGB 13.8 11.1*  HCT 39.6 32.3*  PLT 205 184    COAGS: No results for input(s): INR, APTT in the last 8760 hours.  BMP:  Recent Labs  04/29/17 0112 04/30/17 0428  NA 133* 136  K 4.4 4.0  CL 103 106  CO2 23 24  GLUCOSE 273* 165*  BUN 16 19  CALCIUM 8.8* 8.8*  CREATININE 0.89 0.81  GFRNONAA >60 >60  GFRAA >60 >60    LIVER FUNCTION TESTS:  Recent Labs  04/29/17 0112    BILITOT 0.9  AST 22  ALT 22  ALKPHOS 92  PROT 7.2  ALBUMIN 3.7    Assessment and Plan:  Acute CVA  Status post cerebral angiogram and mechanical thrombectomy of proximal occlusion of the superior and dominant MCA branch of the right hemisphere with restoration of TICI 3 flow by Dr. Loreta Ave.  Remove bulky dressings and place a Band-Aid.  Can begin PT when appropriate.  Electronically Signed: Gwynneth Macleod, PA-C 04/30/2017, 10:21 AM   I spent a total of 15 Minutes at the the patient's bedside AND on the patient's hospital floor or unit, greater than 50% of which was counseling/coordinating care for f/u after cerebral intervention.    '

## 2017-04-30 NOTE — Progress Notes (Signed)
Preliminary results by tech - Venous Duplex Lower Ext. Completed. No evidence of  DVT in the veins that were clearly visualized. The right common femoral vein was not imaged due to surgical bandages in place. Marilynne Halstedita Kenlee Vogt, BS, RDMS, RVT

## 2017-04-30 NOTE — Progress Notes (Signed)
Patient noted by CCMD to have 3 beat run vtach, patient is asymptomatic.  Patient resting in bed, spouse at bedside.  No complaints of pain or discomfort.  Vital signs obtained.  On call MD made aware, no new orders at this time.

## 2017-04-30 NOTE — Progress Notes (Signed)
STROKE TEAM PROGRESS NOTE   SUBJECTIVE (INTERVAL HISTORY) No family at bedside. BP was on the low side. cleviprex discontinued. Patient sitting in bed. Left neglect much improved as well as left sided weakness. Passed swallow, off IV fluid. Glucose better controlled on SSI.   OBJECTIVE  Recent Labs Lab 04/29/17 2028 04/29/17 2131 04/29/17 2342 04/30/17 0419 04/30/17 0736  GLUCAP 147* 136* 121* 159* 145*    Recent Labs Lab 04/29/17 0112 04/30/17 0428  NA 133* 136  K 4.4 4.0  CL 103 106  CO2 23 24  GLUCOSE 273* 165*  BUN 16 19  CREATININE 0.89 0.81  CALCIUM 8.8* 8.8*    Recent Labs Lab 04/29/17 0112  AST 22  ALT 22  ALKPHOS 92  BILITOT 0.9  PROT 7.2  ALBUMIN 3.7    Recent Labs Lab 04/29/17 0112 04/30/17 0428  WBC 14.3* 21.6*  HGB 13.8 11.1*  HCT 39.6 32.3*  MCV 91.0 91.8  PLT 205 184   No results for input(s): CKTOTAL, CKMB, CKMBINDEX, TROPONINI in the last 168 hours. No results for input(s): LABPROT, INR in the last 72 hours. No results for input(s): COLORURINE, LABSPEC, PHURINE, GLUCOSEU, HGBUR, BILIRUBINUR, KETONESUR, PROTEINUR, UROBILINOGEN, NITRITE, LEUKOCYTESUR in the last 72 hours.  Invalid input(s): APPERANCEUR     Component Value Date/Time   CHOL 102 04/29/2017 0451   TRIG 104 04/29/2017 0451   HDL 30 (L) 04/29/2017 0451   CHOLHDL 3.4 04/29/2017 0451   VLDL 21 04/29/2017 0451   LDLCALC 51 04/29/2017 0451   Lab Results  Component Value Date   HGBA1C 8.4 (H) 04/29/2017   No results found for: LABOPIA, COCAINSCRNUR, LABBENZ, AMPHETMU, THCU, LABBARB  No results for input(s): ETH in the last 168 hours.  IMAGING I have personally reviewed the radiological images below and agree with the radiology interpretations.  Ct Head Wo Contrast 04/29/2017 IMPRESSION:  1. Evolving infarction and right superior insula and frontal operculum measuring 4.6 cc within the region of perfusion anomaly on prior CT perfusion.  2. No new large territory acute  infarction, hemorrhage, or mass effect.  3. Mild chronic microvascular ischemic changes and parenchymal volume loss of the brain.   Ct Head Wo Contrast 04/29/2017 IMPRESSION:  No intracranial hemorrhage or cytotoxic edema following vascular intervention.   [name] Head Wo Contrast 04/30/2017 MRI HEAD:  1. Mildly motion degraded examination.  2. Acute patchy RIGHT frontal lobe infarct, with petechial hemorrhage within insular component.  3. Mild to moderate chronic small vessel ischemic disease.  MRA HEAD:  1. Moderately motion degraded examination.  2. No emergent large vessel occlusion or flow-limiting stenosis.  3. Focal stenosis versus motion artifact RIGHT M2 origin.   Cerebral angiogram 04/29/2017 Status post cerebral angiogram and mechanical thrombectomy of proximal occlusion of the superior and dominant MCA branch of the right hemisphere with restoration of TICI 3 flow.   Ct Head Code Stroke Wo Contrast Ct Cerebral Perfusion W Contrast 04/29/2017 IMPRESSION:  1. No acute hemorrhage or mass lesion.  2. ASPECTS is 10.  3. Elevated mean transit time within the anterior right MCA distribution without associated abnormal cerebral blood volume or cerebral blood flow likely indicates an area of acute ischemia without infarction.   Dg Chest Port 1 View 04/29/2017 IMPRESSION:  Cardiomegaly with mild vascular congestion.   Transthoracic Echocardiogram  Impressions: - Mildly dilated LV with mild LV hypertrophy. EF 30-35%.   Inferolateral akinesis, anterolateral severe hypokinesis. Normal   RV size and systolic function. At least moderate, eccentric  posteriorly-directed mitral regurgitation. Wall motion   abnormalities raise concern for infarct-related MR.     Bilateral Lower Extremity Dopplers - pending  EEG 04/29/2017  The EEG is abnormal and findings are suggestive of generalized and right fronto temporal focal cerebral dysfunction.Epileptiform activity was not seen during  this recording     PHYSICAL EXAM Temp:  [97.6 F (36.4 C)-98.5 F (36.9 C)] 98.1 F (36.7 C) (11/03 0400) Pulse Rate:  [77-102] 84 (11/03 0700) Resp:  [11-25] 15 (11/03 0700) BP: (92-139)/(46-109) 119/72 (11/03 0700) SpO2:  [93 %-100 %] 95 % (11/03 0700) Weight:  [248 lb 7.3 oz (112.7 kg)] 248 lb 7.3 oz (112.7 kg) (11/02 1200)    Vitals:   04/30/17 0800 04/30/17 0900 04/30/17 1000 04/30/17 1100  BP: 115/61 (!) 81/71 116/61 119/61  Pulse: 89 97 82 91  Resp: 16 19 16 17   Temp: 98.4 F (36.9 C)     TempSrc: Oral     SpO2: 95% 98% 96% 96%  Weight:      Height:         General - Well nourished, well developed, in no apparent distress Respiratory - Unlabored breathing noted on exam Cardiovascular - Regular rate and rhythm   Neuro:   Mental Status: Patient is awake, alert, oriented to person, place, month, year, and situation. No signs of aphasia but patient does have mildly dysarthric speech.  Cranial Nerves: II: visual field full III,IV, VI: right gaze preference but able to cross midline, PERRL. V: Facial sensation symmetrical VII: Facial movement - Left facial droop VIII: hearing is intact to voice X: Uvula elevates symmetrically XI: Shoulder shrug is symmetric. XII: tongue is midline without atrophy or fasciculations.  Motor: He has normal strength in the right side, Left Arm was 4/5,   hand 3/5 and Left Leg 4+/5.   sensory: Sensation is symmetrical Cerebellar: No ataxia on the bilaterally, but slow on the left    ASSESSMENT AND PLAN: [name] D Nathan [name] is a 71 y.o. male with diabetes mellitus and cardiomyopathy EF of 30% who presents with left-sided weakness. [facility] where he was given IV TPA 04/28/2017.  He had a CTA showing a relatively distal M2 occlusion, with very mild symptoms.  He was accepted for [facility].  En route, he markedly worsened, after he arrived he was taken for a stat CT perfusion which  demonstrates a large penumbra.IR was called and he was taken for IR intervention. The patient was extubated post procedure   Stroke:  Acute patchy RIGHT MCA infarct s/p tPA and mechanical thrombectomy with TICI3 reperfusion - likely embolic secondary to large vessel disease vs. cardiomyopathy with low EF.  Resultant right gaze preference, left facial droop, left hemiparesis  CT head - No acute hemorrhage or mass lesion.   CT head and neck - distal right M2 occlusion  CTP right MCA penumbra  Cerebral angiogram - right superior M2 occlusion s/p TICI3 revascularization  MRI head - Acute patchy RIGHT frontal lobe infarct with petechial hemorrhage within insular component.   MRA head - Focal stenosis versus motion artifact RIGHT M2 origin.   2D Echo - EF of 30-35%. No cardiac source of emboli identified.  LE venous Doppler pending  LDL - 51  HgbA1c - 8.4  VTE prophylaxis -heparin subq  Diet Carb Modified Fluid consistency: Thin; Room service appropriate? Yes  aspirin 81 mg daily prior to admission, now on aspirin 325 mg daily and plavix  75mg  daily.   Patient counseled to be compliant with his antithrombotic medications  Ongoing aggressive stroke risk factor management  Therapy recommendations:  - HH PT  Disposition: Pending  Cardiomyopathy  EF 30-35%  Following with Dr. Arnoldo Morale in [place]  No need to be on anticoagulation at this time due to EF >30%  Close follow-up with cardiology  Uncontrolled diabetes  Hyperglycemia on admission  HgbA1c 8.4, goal < 7.0  Off insulin drip  Back on home meds  SSI  CBG monitoring  Hypertension  Blood pressure tends to run low with low ejection fraction  On low-dose Coreg and lisinopril  Long-term BP goal normotensive  Hyperlipidemia  Home meds:  Zocor 40 mg daily resumed in hospital  LDL 51,  goal < 70  Continue statin at discharge  Other Stroke Risk Factors  Advanced age  ETOH use, advised to drink no more  than 1 drink per day.  Obesity, Body mass index is 34.65 kg/m., recommend weight loss, diet and exercise as appropriate   Other Active Problems  Leukocytosis - 14.3->21.6 (afebrile)   Hospital day # 2  This patient is critically ill due to right MCA infarct, hemorrhagic conversion, cardiomyopathy with low EF, uncontrolled diabetes, leukocytosis and at significant risk of neurological worsening, death form recurrent infarct, hemorrhagic conversion, heart failure, DKA, sepsis. This patient's care requires constant monitoring of vital signs, hemodynamics, respiratory and cardiac monitoring, review of multiple databases, neurological assessment, discussion with family, other specialists and medical decision making of high complexity. I spent 40 minutes of neurocritical care time in the care of this patient.  Marvel Plan, MD PhD Stroke Neurology 04/30/2017 4:56 PM   To contact Stroke Continuity provider, please refer to [email or web address]. After hours, contact General Neurology

## 2017-04-30 NOTE — Progress Notes (Signed)
[facility] PCCM  Stable overnight, [facility]M will sign off  Nathan CarolinaBrent Ismahan Lippman, MD [facility] Pager: [phone] Cell: [phone] After 3pm or if no response, call [phone]

## 2017-05-01 ENCOUNTER — Other Ambulatory Visit: Payer: Self-pay

## 2017-05-01 ENCOUNTER — Inpatient Hospital Stay (HOSPITAL_COMMUNITY): Payer: Medicare Other

## 2017-05-01 DIAGNOSIS — E1110 Type 2 diabetes mellitus with ketoacidosis without coma: Secondary | ICD-10-CM

## 2017-05-01 DIAGNOSIS — E785 Hyperlipidemia, unspecified: Secondary | ICD-10-CM

## 2017-05-01 DIAGNOSIS — I420 Dilated cardiomyopathy: Secondary | ICD-10-CM

## 2017-05-01 DIAGNOSIS — I4891 Unspecified atrial fibrillation: Secondary | ICD-10-CM

## 2017-05-01 DIAGNOSIS — I480 Paroxysmal atrial fibrillation: Secondary | ICD-10-CM

## 2017-05-01 DIAGNOSIS — I959 Hypotension, unspecified: Secondary | ICD-10-CM

## 2017-05-01 DIAGNOSIS — I952 Hypotension due to drugs: Secondary | ICD-10-CM

## 2017-05-01 DIAGNOSIS — I429 Cardiomyopathy, unspecified: Secondary | ICD-10-CM

## 2017-05-01 DIAGNOSIS — I6389 Other cerebral infarction: Secondary | ICD-10-CM

## 2017-05-01 HISTORY — DX: Cardiomyopathy, unspecified: I42.9

## 2017-05-01 HISTORY — DX: Hypotension, unspecified: I95.9

## 2017-05-01 HISTORY — DX: Type 2 diabetes mellitus with ketoacidosis without coma: E11.10

## 2017-05-01 HISTORY — DX: Dilated cardiomyopathy: I42.0

## 2017-05-01 HISTORY — DX: Unspecified atrial fibrillation: I48.91

## 2017-05-01 HISTORY — DX: Paroxysmal atrial fibrillation: I48.0

## 2017-05-01 HISTORY — DX: Hyperlipidemia, unspecified: E78.5

## 2017-05-01 LAB — GLUCOSE, CAPILLARY
GLUCOSE-CAPILLARY: 152 mg/dL — AB (ref 65–99)
GLUCOSE-CAPILLARY: 206 mg/dL — AB (ref 65–99)
GLUCOSE-CAPILLARY: 159 mg/dL — AB (ref 65–99)
Glucose-Capillary: 141 mg/dL — ABNORMAL HIGH (ref 65–99)
Glucose-Capillary: 146 mg/dL — ABNORMAL HIGH (ref 65–99)
Glucose-Capillary: 170 mg/dL — ABNORMAL HIGH (ref 65–99)

## 2017-05-01 LAB — CBC
HCT: 34.5 % — ABNORMAL LOW (ref 39.0–52.0)
HEMOGLOBIN: 11.3 g/dL — AB (ref 13.0–17.0)
MCH: 30.9 pg (ref 26.0–34.0)
MCHC: 32.8 g/dL (ref 30.0–36.0)
MCV: 94.3 fL (ref 78.0–100.0)
PLATELETS: 186 10*3/uL (ref 150–400)
RBC: 3.66 MIL/uL — AB (ref 4.22–5.81)
RDW: 12.9 % (ref 11.5–15.5)
WBC: 13.2 10*3/uL — ABNORMAL HIGH (ref 4.0–10.5)

## 2017-05-01 LAB — BASIC METABOLIC PANEL
Anion gap: 5 (ref 5–15)
BUN: 18 mg/dL (ref 6–20)
CHLORIDE: 105 mmol/L (ref 101–111)
CO2: 25 mmol/L (ref 22–32)
CREATININE: 0.82 mg/dL (ref 0.61–1.24)
Calcium: 8.5 mg/dL — ABNORMAL LOW (ref 8.9–10.3)
Glucose, Bld: 182 mg/dL — ABNORMAL HIGH (ref 65–99)
POTASSIUM: 4.2 mmol/L (ref 3.5–5.1)
SODIUM: 135 mmol/L (ref 135–145)

## 2017-05-01 MED ORDER — DEXTROSE 5 % IV SOLN
5.0000 mg/h | INTRAVENOUS | Status: DC
  Administered 2017-05-01: 10 mg/h via INTRAVENOUS
  Administered 2017-05-01: 5 mg/h via INTRAVENOUS
  Administered 2017-05-02: 10 mg/h via INTRAVENOUS
  Filled 2017-05-01 (×5): qty 100

## 2017-05-01 MED ORDER — METOPROLOL TARTRATE 5 MG/5ML IV SOLN
INTRAVENOUS | Status: AC
  Filled 2017-05-01: qty 5

## 2017-05-01 MED ORDER — SODIUM CHLORIDE 0.9 % IV SOLN
INTRAVENOUS | Status: DC
  Administered 2017-05-01: 16:00:00 via INTRAVENOUS

## 2017-05-01 MED ORDER — METOPROLOL TARTRATE 5 MG/5ML IV SOLN
5.0000 mg | INTRAVENOUS | Status: DC | PRN
  Administered 2017-05-01: 5 mg via INTRAVENOUS

## 2017-05-01 MED ORDER — INSULIN ASPART 100 UNIT/ML ~~LOC~~ SOLN
0.0000 [IU] | Freq: Three times a day (TID) | SUBCUTANEOUS | Status: DC
  Administered 2017-05-01: 5 [IU] via SUBCUTANEOUS
  Administered 2017-05-01 (×3): 3 [IU] via SUBCUTANEOUS
  Administered 2017-05-01 – 2017-05-02 (×2): 2 [IU] via SUBCUTANEOUS
  Administered 2017-05-02: 3 [IU] via SUBCUTANEOUS
  Administered 2017-05-02: 2 [IU] via SUBCUTANEOUS
  Administered 2017-05-02: 3 [IU] via SUBCUTANEOUS
  Administered 2017-05-03: 2 [IU] via SUBCUTANEOUS

## 2017-05-01 NOTE — Progress Notes (Signed)
PT Cancellation Note  Patient Details Name: Nathan [name] MRN: [id number] DOB: 1946-04-05   Cancelled Treatment:    Reason Eval/Treat Not Completed: Medical issues which prohibited therapy RN reported pt on bed rest (no orders) due to Afib and syncopal event earlier today. PT will continue to follow acutely.    Derek MoundKellyn R Trenace Coughlin Shonn Farruggia, PTA Pager: [phone]   05/01/2017, 2:55 PM

## 2017-05-01 NOTE — Progress Notes (Signed)
NURSING PROGRESS NOTE  Nathan PhilipsRandleman D Baade [name] [id number] Transfer Data: 05/01/2017 11:33 AM Attending Provider: Marvel PlanXu, Jindong, MD WUJ:[id number]:Robbins, Elana Almobert A, MD Code Status: Full   Xxavier D Dayna RamusFerree [name] is a 71 y.o. male patient transferred from 3[facility]  -No acute distress noted.  -No complaints of shortness of breath.  -No complaints of chest pain.   Cardiac Monitoring: Box # 29 in place. Cardiac monitor yields: Afib 120's-130's   Blood Pressure 91/79, MAP 85, Pulse 124, Resp 21, Oral Temp 98.3, RA SPO2 98%    IV Fluids:  IV in place, occlusive dsg intact without redness, IV cath, Cardizem 10cc/hr, NS 10cc/hr  .   Allergies:  Patient has no allergy information on record.  Past Medical History:   has a past medical history of Diabetes mellitus without complication (HCC).  Past Surgical History:   has a past surgical history that includes IR PERCUTANEOUS ART THROMBECTOMY/INFUSION INTRACRANIAL INC DIAG ANGIO (04/29/2017); IR US Guide Vasc Access Right (04/29/2017); and IR US Guide Vasc Access Left (04/29/2017).  Social History:     Skin: Intact  Patient/Family orientated to room. Information packet given to patient/family. Admission inpatient armband information verified with patient/family to include name and date of birth and placed on patient arm. Side rails up x 2, fall assessment and education completed with patient/family. Patient/family able to verbalize understanding of risk associated with falls and verbalized understanding to call for assistance before getting out of bed. Call light within reach. Patient/family able to voice and demonstrate understanding of unit orientation instructions.    Will continue to evaluate and treat per MD orders.

## 2017-05-01 NOTE — Evaluation (Signed)
Speech Language Pathology Evaluation Patient Details Name: Nathan [name] MRN: [id number] DOB: 10-26-1945 Today's Date: 05/01/2017 Time: 7829-5621 SLP Time Calculation (min) (ACUTE ONLY): 15 min  Problem List:  Patient Active Problem List   Diagnosis Date Noted  . Atrial fibrillation with RVR (HCC) 05/01/2017  . Cardiomyopathy (HCC) 05/01/2017  . DM (diabetes mellitus) type 2, uncontrolled, with ketoacidosis (HCC) 05/01/2017  . Hypotension 05/01/2017  . HLD (hyperlipidemia) 05/01/2017  . Stroke (cerebrum) (HCC) 04/29/2017   Past Medical History:  Past Medical History:  Diagnosis Date  . Diabetes mellitus without complication [facility])    Past Surgical History:  Past Surgical History:  Procedure Laterality Date  . IR PERCUTANEOUS ART THROMBECTOMY/INFUSION INTRACRANIAL INC DIAG ANGIO  04/29/2017  . IR US GUIDE VASC ACCESS LEFT  04/29/2017  . IR US GUIDE VASC ACCESS RIGHT  04/29/2017   HPI:  NathanNathan [name] Jris a 71 y.o.male who presented withleft-sidedweakness.He was taken to [facility] ER where he was givenIV TPA 04/28/2017.He had a CTA showing a relativelydistal M2 occlusion, with very mild symptoms. He was transferred to [facility]. En route, he markedly worsened, after he arrived he was taken for a stat CT perfusion which demonstrates a large penumbra. IR was called and he was taken for IR intervention. MRI 11/3 showed acute patchy RIGHT frontal lobe infarct, with petechial hemorrhage within insular component.   Assessment / Plan / Recommendation Clinical Impression  Patient presents with moderate cognitive communication impairment characterized by deficits in attention, working memory, delayed recall. Pt also with mild left facial droop and minimal dysarthria; he reports speech is close to baseline. Pt scored 15/30 on MOCA form 7.1 (>26 is normal). Pt occasionally impulsive, attempting tasks prior to SLP completing instructions, and occasionally requesting  repetition of instructions due to decreased sustained attention, also noted during letter tap task. Working memory impaired; pt forgot amount he was subtracting with serial subtraction. Delayed recall 1/5; pt does have some insight into his impairments ("my memory's bad,") however decreased awareness of physical deficits and safety awareness noted by PT. Recommend 24 hour supervision, HH SLP services upon acute d/c. Will follow acutely.    SLP Assessment  SLP Recommendation/Assessment: Patient needs continued Speech Lanaguage Pathology Services SLP Visit Diagnosis: Cognitive communication deficit (R41.841)    Follow Up Recommendations  Home health SLP;24 hour supervision/assistance    Frequency and Duration min 1 x/week  2 weeks      SLP Evaluation Cognition  Overall Cognitive Status: History of cognitive impairments - at baseline Arousal/Alertness: Awake/alert Orientation Level: Oriented X4 Attention: Focused;Sustained Focused Attention: Appears intact Sustained Attention: Impaired Sustained Attention Impairment: Verbal basic;Functional basic Memory: Impaired Memory Impairment: Storage deficit;Decreased recall of new information;Decreased short term memory Decreased Short Term Memory: Verbal basic;Functional basic Awareness: Impaired Awareness Impairment: Intellectual impairment;Emergent impairment;Anticipatory impairment Problem Solving: Impaired Problem Solving Impairment: Verbal complex;Functional complex Executive Function: Sequencing;Organizing;Self Monitoring Sequencing: Appears intact Organizing: Appears intact Self Monitoring: Appears intact Safety/Judgment: Impaired(due to decreased awareness of deficits)       Comprehension  Auditory Comprehension Overall Auditory Comprehension: Appears within functional limits for tasks assessed Yes/No Questions: Within Functional Limits Commands: Within Functional Limits Conversation: Simple Visual  Recognition/Discrimination Discrimination: Within Function Limits Reading Comprehension Reading Status: Not tested    Expression Expression Primary Mode of Expression: Verbal Verbal Expression Overall Verbal Expression: Appears within functional limits for tasks assessed Repetition: No impairment Naming: No impairment Pragmatics: No impairment Written Expression Dominant Hand: Left Written Expression: Not tested   Oral /  Motor  Oral Motor/Sensory Function Overall Oral Motor/Sensory Function: Mild impairment Facial ROM: Reduced left Facial Symmetry: Abnormal symmetry left Facial Strength: Within Functional Limits Facial Sensation: Within Functional Limits Lingual ROM: Within Functional Limits Lingual Symmetry: Within Functional Limits Lingual Strength: Within Functional Limits Lingual Sensation: Within Functional Limits Velum: Within Functional Limits Mandible: Within Functional Limits Motor Speech Overall Motor Speech: Impaired Respiration: Within functional limits Phonation: Normal Resonance: Within functional limits Articulation: Impaired Level of Impairment: Conversation Intelligibility: Intelligible Motor Planning: Witnin functional limits Motor Speech Errors: Aware Effective Techniques: Slow rate;Over-articulate   GO             Rondel BatonMary Beth Alim Cattell, TennesseeMS, CCC-SLP Speech-Language Pathologist [phone]        Arlana LindauMary E Tobechukwu Emmick 05/01/2017, 2:37 PM

## 2017-05-01 NOTE — Consult Note (Addendum)
Cardiology Consult    Patient ID: Nathan [name] MRN: [id number], DOB/AGE: 69-08-1945   Admit date: 04/28/2017 Date of Consult: 05/04/2017  Primary Physician: [name], MD Primary Cardiologist: Dulce Sellar Requesting Provider: Dr. Amada Jupiter  Reason for Consultation: Afib RVR  Nathan [name] is a 71 y.o. male who is being seen today for the evaluation of Afib RVR at the request of Dr. Amada Jupiter.   Patient Profile    71 yo male with PMH of chronic combined HF (viral cardiomyopathy), HTN, and DM who presented with Stroke, now in Afib RVR.   Past Medical History   Past Medical History:  Diagnosis Date  . Diabetes mellitus without complication [facility])     Past Surgical History:  Procedure Laterality Date  . IR PERCUTANEOUS ART THROMBECTOMY/INFUSION INTRACRANIAL INC DIAG ANGIO  04/29/2017  . IR US GUIDE VASC ACCESS LEFT  04/29/2017  . IR US GUIDE VASC ACCESS RIGHT  04/29/2017     Allergies  Not on File  History of Present Illness    Mr. Ruffins is a 71 yo male with PMH of chronic combined HF (viral cardiomyopathy), HTN, and DM. He is followed by Dr. Dulce Sellar as an outpatient. 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 He presented to [facility] ED with left sided weakness and Code Stroke was called. Given IV TPA. CT showed distal M2 occlusion. He was transferred to [facility]. En route developed worsening symptoms and was taken to CT which showed large penumbra. He was intubated and taken for mechanical thrombectomy. He was extubated post procedure. Echo 11/2 showed EF of 30-35% with inferolateral akinesis and anterolateral hypokinesis. Normal RV size.   This morning went into Afib RVR with rates in the 140-150s. Was symptomatic with shortness of breath, diaphoresis and lightheadedness. On call fellow was paged and patient started on Cardizem gtt. Rates now improved, and symptoms resolved. Rates in the 120s currently.    Inpatient  Medications      Family History    Family History  Problem Relation Age of Onset  . Hypertension Father   . Diabetes Father     Social History    Social History   Socioeconomic History  . Marital status: Married    Spouse name: Not on file  . Number of children: Not on file  . Years of education: Not on file  . Highest education level: Not on file  Social Needs  . Financial resource strain: Not on file  . Food insecurity - worry: Not on file  . Food insecurity - inability: Not on file  . Transportation needs - medical: Not on file  . Transportation needs - non-medical: Not on file  Occupational History  . Not on file  Tobacco Use  . Smoking status: Former Smoker  Substance and Sexual Activity  . Alcohol use: Not on file  . Drug use: Not on file  . Sexual activity: Not on file  Other Topics Concern  . Not on file  Social History Narrative  . Not on file     Review of Systems    See HPI  All other systems reviewed and are otherwise negative except as noted above.  Physical Exam    Blood pressure 127/76, pulse 83, temperature 97.8 F (36.6 C), temperature source Oral, resp. rate 18, height 5\' 11"  (1.803 m), weight 236 lb 6.4 oz (107.2 kg), SpO2 100 %.  General: Pleasant, older WM, NAD Psych: Normal affect. Neuro:  Alert and oriented X 3. Moves all extremities spontaneously. Left sided facial droop.  HEENT: Normal  Neck: Supple without bruits or JVD. Lungs:  Resp regular and unlabored, CTA. Heart: Irreg Irreg no s3, s4, or murmurs. Abdomen: Soft, non-tender, non-distended, BS + x 4.  Extremities: No clubbing, cyanosis or edema. DP/PT/Radials 2+ and equal bilaterally.  Labs    Troponin (Point of Care Test) No results for input(s): TROPIPOC in the last 72 hours. No results for input(s): CKTOTAL, CKMB, TROPONINI in the last 72 hours. Lab Results  Component Value Date   WBC 10.6 (H) 05/03/2017   HGB 11.1 (L) 05/03/2017   HCT 33.2 (L) 05/03/2017   MCV  91.2 05/03/2017   PLT 183 05/03/2017    Recent Labs  Lab 04/29/17 0112  05/03/17 0627  NA 133*   < > 135  K 4.4   < > 3.8  CL 103   < > 103  CO2 23   < > 24  BUN 16   < > 13  CREATININE 0.89   < > 0.79  CALCIUM 8.8*   < > 8.9  PROT 7.2  --   --   BILITOT 0.9  --   --   ALKPHOS 92  --   --   ALT 22  --   --   AST 22  --   --   GLUCOSE 273*   < > 150*   < > = values in this interval not displayed.   Lab Results  Component Value Date   CHOL 102 04/29/2017   HDL 30 (L) 04/29/2017   LDLCALC 51 04/29/2017   TRIG 104 04/29/2017   No results found for: [facility]   Radiology Studies    Ct Head Wo Contrast  Result Date: 05/03/2017 CLINICAL DATA:  Stroke EXAM: CT HEAD WITHOUT CONTRAST TECHNIQUE: Contiguous axial images were obtained from the base of the skull through the vertex without intravenous contrast. COMPARISON:  MRI head 04/30/2017 FINDINGS: Brain: Hypodensity in the right insula and frontal operculum unchanged from prior MRI compatible with recent infarct. Negative for hemorrhage. No other acute infarct identified. Mild atrophy without hydrocephalus. Negative for mass lesion. Vascular: Negative for hyperdense vessel Skull: Negative Sinuses/Orbits: Negative Other: None IMPRESSION: Acute infarct in the insula and right frontal operculum unchanged from prior MRI. No hemorrhage or other acute abnormality. Electronically Signed   By: Marlan Palau M.D.   On: 05/03/2017 11:59   Ct Head Wo Contrast  Result Date: 04/29/2017 CLINICAL DATA:  71 y/o  M; stroke follow-up. EXAM: CT HEAD WITHOUT CONTRAST TECHNIQUE: Contiguous axial images were obtained from the base of the skull through the vertex without intravenous contrast. COMPARISON:  04/29/2017 CT head and CT perfusion head. FINDINGS: Brain: Area of hypoattenuation within the right superior insula and frontal operculum measuring 2.1 x 1.9 x 2.2 cm (volume = 4.6 cm^3) compatible infarction within the region of perfusion anomaly on prior CT.  No new acute intracranial hemorrhage, infarction, or significant mass effect identified. Background of mild chronic microvascular ischemic changes and parenchymal volume loss. Vascular: Mild calcific atherosclerosis of carotid siphons. No hyperdense vessel identified. Skull: Normal. Negative for fracture or focal lesion. Sinuses/Orbits: No acute finding. Other: None. IMPRESSION: 1. Evolving infarction and right superior insula and frontal operculum measuring 4.6 cc within the region of perfusion anomaly on prior CT perfusion. 2. No new large territory acute infarction, hemorrhage, or mass effect. 3. Mild chronic microvascular ischemic changes and parenchymal volume loss of the  brain. Electronically Signed   By: Mitzi Hansen M.D.   On: 04/29/2017 19:05   Ct Head Wo Contrast  Result Date: 04/29/2017 CLINICAL DATA:  Right MCA stroke with percutaneous intervention. EXAM: CT HEAD WITHOUT CONTRAST TECHNIQUE: Contiguous axial images were obtained from the base of the skull through the vertex without intravenous contrast. COMPARISON:  Cerebral angiogram 04/29/2017 Head CT 04/29/2017 FINDINGS: Brain: There is no intracranial hemorrhage. Gray-white differentiation is preserved. Basal ganglia and insular ribbons are preserved. Vascular: No hyperdense vessel or unexpected calcification.There is mild intravascular enhancement due to presence of circulating contrast. Skull: Normal visualized skull base, calvarium and extracranial soft tissues. Sinuses/Orbits: No sinus fluid levels or advanced mucosal thickening. No mastoid effusion. Normal orbits. IMPRESSION: No intracranial hemorrhage or cytotoxic edema following vascular intervention. Electronically Signed   By: Deatra Robinson M.D.   On: 04/29/2017 04:05   [name] Head Wo Contrast  Result Date: 04/30/2017 CLINICAL DATA:  Follow-up acute RIGHT MCA stroke and mechanical thrombectomy of MCA occlusion. EXAM: MRI HEAD WITHOUT CONTRAST MRA HEAD WITHOUT CONTRAST  TECHNIQUE: Multiplanar, multiecho pulse sequences of the brain and surrounding structures were obtained without intravenous contrast. Angiographic images of the head were obtained using MRA technique without contrast. COMPARISON:  CT HEAD April 29, 2017 and CT angiogram of the head April 28, 2017 FINDINGS: MRI HEAD FINDINGS- mildly motion degraded examination. BRAIN: Patchy RIGHT frontal reduced diffusion, 1.5 x 2 cm confluent component RIGHT insula with low ADC values and susceptibility artifact within the insular component. LEFT frontal lobe chronic microhemorrhage. Patchy supratentorial white matter FLAIR T2 hyperintensities exclusive of aforementioned abnormality. Ventricles and sulci are normal for patient's age. No midline shift, mass effect or masses. No abnormal extra-axial fluid collections. VASCULAR: See below. SKULL AND UPPER CERVICAL SPINE: No abnormal sellar expansion. No suspicious calvarial bone marrow signal. Craniocervical junction maintained. SINUSES/ORBITS: The mastoid air-cells and included paranasal sinuses are well-aerated. The included ocular globes and orbital contents are non-suspicious. OTHER: None. MRA HEAD FINDINGS- moderately motion degraded examination. ANTERIOR CIRCULATION: Normal flow related enhancement of the included cervical, petrous, cavernous and supraclinoid internal carotid arteries. Patent anterior communicating artery. Patent anterior and middle cerebral arteries, including distal segments. Focal stenosis versus motion artifact RIGHT M2 origin seen on maximum intensity projected reformations. No large vessel occlusion, flow limiting stenosis. POSTERIOR CIRCULATION: LEFT vertebral artery is dominant. Basilar artery is patent, with normal flow related enhancement of the main branch vessels. Patent posterior cerebral arteries. Bilateral posterior communicating artery's present. No large vessel occlusion, flow limiting stenosis. ANATOMIC VARIANTS: None. Source images and MIP  images were reviewed. IMPRESSION: MRI HEAD: 1. Mildly motion degraded examination. 2. Acute patchy RIGHT frontal lobe infarct, with petechial hemorrhage within insular component. 3. Mild to moderate chronic small vessel ischemic disease. MRA HEAD: 1. Moderately motion degraded examination. 2. No emergent large vessel occlusion or flow-limiting stenosis. 3. Focal stenosis versus motion artifact RIGHT M2 origin. Electronically Signed   By: Awilda Metro M.D.   On: 04/30/2017 04:32   Mr Brain Wo Contrast  Result Date: 04/30/2017 CLINICAL DATA:  Follow-up acute RIGHT MCA stroke and mechanical thrombectomy of MCA occlusion. EXAM: MRI HEAD WITHOUT CONTRAST MRA HEAD WITHOUT CONTRAST TECHNIQUE: Multiplanar, multiecho pulse sequences of the brain and surrounding structures were obtained without intravenous contrast. Angiographic images of the head were obtained using MRA technique without contrast. COMPARISON:  CT HEAD April 29, 2017 and CT angiogram of the head April 28, 2017 FINDINGS: MRI HEAD FINDINGS- mildly motion degraded examination. BRAIN: Patchy  RIGHT frontal reduced diffusion, 1.5 x 2 cm confluent component RIGHT insula with low ADC values and susceptibility artifact within the insular component. LEFT frontal lobe chronic microhemorrhage. Patchy supratentorial white matter FLAIR T2 hyperintensities exclusive of aforementioned abnormality. Ventricles and sulci are normal for patient's age. No midline shift, mass effect or masses. No abnormal extra-axial fluid collections. VASCULAR: See below. SKULL AND UPPER CERVICAL SPINE: No abnormal sellar expansion. No suspicious calvarial bone marrow signal. Craniocervical junction maintained. SINUSES/ORBITS: The mastoid air-cells and included paranasal sinuses are well-aerated. The included ocular globes and orbital contents are non-suspicious. OTHER: None. MRA HEAD FINDINGS- moderately motion degraded examination. ANTERIOR CIRCULATION: Normal flow related  enhancement of the included cervical, petrous, cavernous and supraclinoid internal carotid arteries. Patent anterior communicating artery. Patent anterior and middle cerebral arteries, including distal segments. Focal stenosis versus motion artifact RIGHT M2 origin seen on maximum intensity projected reformations. No large vessel occlusion, flow limiting stenosis. POSTERIOR CIRCULATION: LEFT vertebral artery is dominant. Basilar artery is patent, with normal flow related enhancement of the main branch vessels. Patent posterior cerebral arteries. Bilateral posterior communicating artery's present. No large vessel occlusion, flow limiting stenosis. ANATOMIC VARIANTS: None. Source images and MIP images were reviewed. IMPRESSION: MRI HEAD: 1. Mildly motion degraded examination. 2. Acute patchy RIGHT frontal lobe infarct, with petechial hemorrhage within insular component. 3. Mild to moderate chronic small vessel ischemic disease. MRA HEAD: 1. Moderately motion degraded examination. 2. No emergent large vessel occlusion or flow-limiting stenosis. 3. Focal stenosis versus motion artifact RIGHT M2 origin. Electronically Signed   By: Awilda Metroourtnay  Bloomer M.D.   On: 04/30/2017 04:32   Ir [place] Guide Vasc Access Left  Result Date: 04/29/2017 INDICATION: 71 year old male with a history of acute right MCA stroke, left-sided symptoms, normal baseline, with stroke scale of 13, aspects 10, within the window for tPA. He presents for angiogram and mechanical thrombectomy as a treatment for emergent large vessel occlusion. EXAM: ULTRASOUND GUIDED ACCESS RIGHT COMMON FEMORAL ARTERY CEREBRAL ANGIOGRAM MECHANICAL THROMBECTOMY RIGHT MCA BRANCHES CLOSURE OF RIGHT COMMON FEMORAL ARTERY ACCESS WITH SUTURE MEDIATED DEVICE COMPARISON:  CT IMAGING OF THE SAME DAY MEDICATIONS: 2.0 g Ancef. The antibiotic was administered within 1 hour of the procedure ANESTHESIA/SEDATION: General anesthesia with anesthesia team CONTRAST:  75 cc Isovue 300  FLUOROSCOPY TIME:  Fluoroscopy Time: 8 minutes 24 seconds (707 mGy). COMPLICATIONS: None TECHNIQUE: Informed written consent was obtained from the patient's family after a thorough discussion of the procedural risks, benefits and alternatives. Specific risks discussed include: Bleeding, infection, contrast reaction, kidney injury/failure, need for further procedure/surgery, arterial injury or dissection, embolization to new territory, intracranial hemorrhage (10-15% risk), neurologic deterioration, cardiopulmonary collapse, death. All questions were addressed. Maximal Sterile Barrier Technique was utilized including during the procedure including caps, mask, sterile gowns, sterile gloves, sterile drape, hand hygiene and skin antiseptic. A timeout was performed prior to the initiation of the procedure. FINDINGS: Initial: Right common carotid artery:  Normal course caliber and contour. Right external carotid artery: Patent with antegrade flow. Right internal carotid artery: Mild atherosclerotic changes with plaque at the carotid bifurcation. No significant stenosis at the bifurcation. Normal course caliber and contour of the cervical portion. Vertical and petrous segment patent with normal course caliber contour. Cavernous segment patent. Clinoid segment patent. Antegrade flow of the ophthalmic artery. Ophthalmic segment patent. Terminus patent. Right MCA: M1 segment is patent. There is an early arising temporal branch, with downward course. Inferior division patent on the initial image. There is proximal occlusion of the superior  and dominant segment. Right ACA: A 1 segment patent. A 2 segment perfuses the right territory. Final: Right MCA: Restoration of flow through the superior and dominant division, TICI 3 PROCEDURE: Patient was brought to the interventional suite, and identification of the patient was confirmed. Patient was placed supine position with gentle anesthesia initiated with the anesthesia team. The  patient is then prepped and draped in the usual sterile fashion. Ultrasound survey of the right inguinal region was performed with images stored and sent to PACs. A micropuncture needle was used access the right common femoral artery under ultrasound. With excellent arterial blood flow returned, and an .018 micro wire was passed through the needle, observed enter the abdominal aorta under fluoroscopy. The needle was removed, and a micropuncture sheath was placed over the wire. The inner dilator and wire were removed, and an 035 Bentson wire was advanced under fluoroscopy into the abdominal aorta. The sheath was removed and a standard 5 [place] vascular sheath was placed. The dilator was removed and the sheath was flushed. Ultrasound survey of the left inguinal region was then performed with images stored and sent to PACs. A micropuncture needle was used access the left common femoral artery under ultrasound. With excellent arterial blood flow returned, an .018 micro wire was passed through the needle, observed to enter the abdominal aorta under fluoroscopy. The needle was removed, and a micropuncture sheath was placed over the wire. The inner dilator and wire were removed, and an 035 Bentson wire was advanced under fluoroscopy into the abdominal aorta. The sheath was removed and a standard 4 [place] vascular sheath was placed for arterial monitoring. Dilator was removed and the sheath was flushed and attached to the transducer. A 86F JB-1 diagnostic catheter was advanced over the wire to the proximal descending thoracic aorta. Wire was then removed. Double flush of the catheter was performed. Catheter was then used to select the innominate artery. Angiogram was performed. Using roadmap technique, the catheter was advanced over a roadrunner wire into right common carotid artery. Formal angiogram was performed. Exchange length Rosen wire was then passed through the diagnostic catheter to the distal common carotid artery  and the diagnostic catheter was removed. The 5 French sheath was removed and exchanged for 8 French 55 centimeter BrightTip sheath. Sheath was flushed and attached to pressurized and heparinized saline bag for constant forward flow. Then an 8 [place], 85 cm Flowgate balloon tip catheter was prepared on the back table with inflation of the balloon with 50/50 concentration of dilute contrast. The balloon catheter was then advanced over the wire, positioned into the distal common carotid artery. Copious back flush was performed and the balloon catheter was attached to heparinized and pressurized saline bag for forward flow. Flowgate balloon catheter was then advanced over the road runner wire into the mid internal carotid artery. Angiogram was performed for road map guide. Microcatheter system was then introduced through the balloon guide catheter, using a synchro soft 014 wire and a Trevo Provue18 catheter. Microcatheter system was advanced into the internal carotid artery, to the level of the occlusion. The micro wire was then carefully advanced through the occluded segment. Microcatheter was then push through the occluded segment and the wire was removed. Blood was then aspirated through the hub of the microcatheter, and a gentle contrast injection was performed confirming intraluminal position. A rotating hemostatic valve was then attached to the back end of the microcatheter, and a pressurized and heparinized saline bag was attached to the catheter. 4  x 40 solitaire device was then selected. Back flush was achieved at the rotating hemostatic valve, and then the device was gently advanced through the microcatheter to the distal end. The retriever was then unsheathed by withdrawing the microcatheter under fluoroscopy. Once the retriever was completely unsheathed, control angiogram was performed from the balloon catheter. The balloon at the balloon tip catheter was then inflated under fluoroscopy for proximal flow  arrest. Constant aspiration was then performed at the tip of the balloon guide catheter as the retriever was gently and slowly withdrawn with fluoroscopic observation. Once the retriever was entirely removed from the system, free aspiration was confirmed at the hub of the balloon guide catheter, with free blood return confirmed. The balloon was then deflated, rotating hemostatic valve was reattached, and a control angiogram was performed. Control angiogram was performed at the right common femoral artery puncture site. Two overlapping suture mediated devices were used for right common femoral artery closure. Patient tolerated the procedure well and remained hemodynamically stable throughout. No complications were encountered and no significant blood loss encountered. IMPRESSION: Status post cerebral angiogram and mechanical thrombectomy of proximal occlusion of the superior and dominant MCA branch of the right hemisphere with restoration of TICI 3 flow. Suture mediated closure of right common femoral artery. Signed, Yvone Neu. Loreta Ave, DO [facility] Radiology PLAN: Extubated in the interventional suite CT post procedure ICU admission Right leg straight until 8 a.m. Electronically Signed   By: Gilmer Mor D.O.   On: 04/29/2017 04:07   Ir US Guide Vasc Access Right  Result Date: 04/29/2017 INDICATION: 71 year old male with a history of acute right MCA stroke, left-sided symptoms, normal baseline, with stroke scale of 13, aspects 10, within the window for tPA. He presents for angiogram and mechanical thrombectomy as a treatment for emergent large vessel occlusion. EXAM: ULTRASOUND GUIDED ACCESS RIGHT COMMON FEMORAL ARTERY CEREBRAL ANGIOGRAM MECHANICAL THROMBECTOMY RIGHT MCA BRANCHES CLOSURE OF RIGHT COMMON FEMORAL ARTERY ACCESS WITH SUTURE MEDIATED DEVICE COMPARISON:  CT IMAGING OF THE SAME DAY MEDICATIONS: 2.0 g Ancef. The antibiotic was administered within 1 hour  of the procedure ANESTHESIA/SEDATION: General anesthesia with anesthesia team CONTRAST:  75 cc Isovue 300 FLUOROSCOPY TIME:  Fluoroscopy Time: 8 minutes 24 seconds (707 mGy). COMPLICATIONS: None TECHNIQUE: Informed written consent was obtained from the patient's family after a thorough discussion of the procedural risks, benefits and alternatives. Specific risks discussed include: Bleeding, infection, contrast reaction, kidney injury/failure, need for further procedure/surgery, arterial injury or dissection, embolization to new territory, intracranial hemorrhage (10-15% risk), neurologic deterioration, cardiopulmonary collapse, death. All questions were addressed. Maximal Sterile Barrier Technique was utilized including during the procedure including caps, mask, sterile gowns, sterile gloves, sterile drape, hand hygiene and skin antiseptic. A timeout was performed prior to the initiation of the procedure. FINDINGS: Initial: Right common carotid artery:  Normal course caliber and contour. Right external carotid artery: Patent with antegrade flow. Right internal carotid artery: Mild atherosclerotic changes with plaque at the carotid bifurcation. No significant stenosis at the bifurcation. Normal course caliber and contour of the cervical portion. Vertical and petrous segment patent with normal course caliber contour. Cavernous segment patent. Clinoid segment patent. Antegrade flow of the ophthalmic artery. Ophthalmic segment patent. Terminus patent. Right MCA: M1 segment is patent. There is an early arising temporal branch, with downward course. Inferior division patent on the initial image. There is proximal occlusion of the superior and dominant segment. Right ACA: A 1 segment patent. A 2 segment perfuses the  right territory. Final: Right MCA: Restoration of flow through the superior and dominant division, TICI 3 PROCEDURE: Patient was brought to the interventional suite, and identification of the patient was  confirmed. Patient was placed supine position with gentle anesthesia initiated with the anesthesia team. The patient is then prepped and draped in the usual sterile fashion. Ultrasound survey of the right inguinal region was performed with images stored and sent to PACs. A micropuncture needle was used access the right common femoral artery under ultrasound. With excellent arterial blood flow returned, and an .018 micro wire was passed through the needle, observed enter the abdominal aorta under fluoroscopy. The needle was removed, and a micropuncture sheath was placed over the wire. The inner dilator and wire were removed, and an 035 Bentson wire was advanced under fluoroscopy into the abdominal aorta. The sheath was removed and a standard 5 [place] vascular sheath was placed. The dilator was removed and the sheath was flushed. Ultrasound survey of the left inguinal region was then performed with images stored and sent to PACs. A micropuncture needle was used access the left common femoral artery under ultrasound. With excellent arterial blood flow returned, an .018 micro wire was passed through the needle, observed to enter the abdominal aorta under fluoroscopy. The needle was removed, and a micropuncture sheath was placed over the wire. The inner dilator and wire were removed, and an 035 Bentson wire was advanced under fluoroscopy into the abdominal aorta. The sheath was removed and a standard 4 [place] vascular sheath was placed for arterial monitoring. Dilator was removed and the sheath was flushed and attached to the transducer. A 72F JB-1 diagnostic catheter was advanced over the wire to the proximal descending thoracic aorta. Wire was then removed. Double flush of the catheter was performed. Catheter was then used to select the innominate artery. Angiogram was performed. Using roadmap technique, the catheter was advanced over a roadrunner wire into right common carotid artery. Formal angiogram was performed.  Exchange length Rosen wire was then passed through the diagnostic catheter to the distal common carotid artery and the diagnostic catheter was removed. The 5 French sheath was removed and exchanged for 8 French 55 centimeter BrightTip sheath. Sheath was flushed and attached to pressurized and heparinized saline bag for constant forward flow. Then an 8 [place], 85 cm Flowgate balloon tip catheter was prepared on the back table with inflation of the balloon with 50/50 concentration of dilute contrast. The balloon catheter was then advanced over the wire, positioned into the distal common carotid artery. Copious back flush was performed and the balloon catheter was attached to heparinized and pressurized saline bag for forward flow. Flowgate balloon catheter was then advanced over the road runner wire into the mid internal carotid artery. Angiogram was performed for road map guide. Microcatheter system was then introduced through the balloon guide catheter, using a synchro soft 014 wire and a Trevo Provue18 catheter. Microcatheter system was advanced into the internal carotid artery, to the level of the occlusion. The micro wire was then carefully advanced through the occluded segment. Microcatheter was then push through the occluded segment and the wire was removed. Blood was then aspirated through the hub of the microcatheter, and a gentle contrast injection was performed confirming intraluminal position. A rotating hemostatic valve was then attached to the back end of the microcatheter, and a pressurized and heparinized saline bag was attached to the catheter. 4 x 40 solitaire device was then selected. Back flush was achieved at the rotating  hemostatic valve, and then the device was gently advanced through the microcatheter to the distal end. The retriever was then unsheathed by withdrawing the microcatheter under fluoroscopy. Once the retriever was completely unsheathed, control angiogram was performed from the  balloon catheter. The balloon at the balloon tip catheter was then inflated under fluoroscopy for proximal flow arrest. Constant aspiration was then performed at the tip of the balloon guide catheter as the retriever was gently and slowly withdrawn with fluoroscopic observation. Once the retriever was entirely removed from the system, free aspiration was confirmed at the hub of the balloon guide catheter, with free blood return confirmed. The balloon was then deflated, rotating hemostatic valve was reattached, and a control angiogram was performed. Control angiogram was performed at the right common femoral artery puncture site. Two overlapping suture mediated devices were used for right common femoral artery closure. Patient tolerated the procedure well and remained hemodynamically stable throughout. No complications were encountered and no significant blood loss encountered. IMPRESSION: Status post cerebral angiogram and mechanical thrombectomy of proximal occlusion of the superior and dominant MCA branch of the right hemisphere with restoration of TICI 3 flow. Suture mediated closure of right common femoral artery. Signed, Yvone Neu. Loreta Ave, DO [facility] PLAN: Extubated in the interventional suite CT post procedure ICU admission Right leg straight until 8 a.m. Electronically Signed   By: Gilmer Mor D.O.   On: 04/29/2017 04:07   Ct Cerebral Perfusion W Contrast  Result Date: 04/29/2017 CLINICAL DATA:  Code stroke. EXAM: CT HEAD WITHOUT CONTRAST CT CEREBRAL PERFUSION WITH CONTRAST TECHNIQUE: Contiguous axial images were obtained from the base of the skull through the vertex without intravenous contrast. Following the administration of contrast material, CT perfusion scanning of the brain was performed. COMPARISON:  None. FINDINGS: Brain: No mass lesion or acute hemorrhage. No focal hypoattenuation of the basal ganglia or cortex to indicate infarcted  tissue. There is periventricular hypoattenuation compatible with chronic microvascular disease. No hydrocephalus or age advanced atrophy. Vascular: No hyperdense vessel. No advanced atherosclerotic calcification of the arteries at the skull base. Skull: Normal visualized skull base, calvarium and extracranial soft tissues. Sinuses/Orbits: No sinus fluid levels or advanced mucosal thickening. No mastoid effusion. Normal orbits. ASPECTS (Alberta Stroke Program Early CT Score) - Ganglionic level infarction (caudate, lentiform nuclei, internal capsule, insula, M1-M3 cortex): 7 - Supraganglionic infarction (M4-M6 cortex): 3 Total score (0-10 with 10 being normal): 10 CT Brain Perfusion Findings: CBF (<30%) Volume: 0mL Perfusion (Tmax>6.0s) volume: 63mL Mismatch Volume: 63mL Ischemia Location:Anterior right MCA distribution IMPRESSION: 1. No acute hemorrhage or mass lesion. 2. ASPECTS is 10. 3. Elevated mean transit time within the anterior right MCA distribution without associated abnormal cerebral blood volume or cerebral blood flow likely indicates an area of acute ischemia without infarction. These results were called by telephone at the time of interpretation on 04/29/2017 at 12:55 am to Dr. Ritta Slot , who verbally acknowledged these results. Electronically Signed   By: Deatra Robinson M.D.   On: 04/29/2017 01:16   Dg Chest Port 1 View  Result Date: 05/01/2017 CLINICAL DATA:  CHF follow-up. EXAM: PORTABLE CHEST 1 VIEW COMPARISON:  April 30, 2017 FINDINGS: Stable cardiomegaly. The hila and mediastinum are normal. Mild vascular crowding in the medial right lung base. No pulmonary edema identified. IMPRESSION: Cardiomegaly.  No pulmonary edema identified.  No acute abnormality. Electronically Signed   By: Gerome Sam III M.D   On: 05/01/2017 16:29   Dg Chest [facility]  1 View  Result Date: 04/30/2017 CLINICAL DATA:  Intracranial hemorrhage. EXAM: PORTABLE CHEST 1 VIEW COMPARISON:  Yesterday. FINDINGS:  Normal sized heart. Clear lungs with decreased prominence of the pulmonary vasculature. Improved inspiration. Stable enlarged cardiac silhouette. Unremarkable bones. IMPRESSION: Stable cardiomegaly with improved pulmonary vascular congestion, at least partly due to an improved inspiration. Electronically Signed   By: Beckie Salts M.D.   On: 04/30/2017 08:00   Dg Chest Port 1 View  Result Date: 04/29/2017 CLINICAL DATA:  Acute respiratory failure.  Stroke. EXAM: PORTABLE CHEST 1 VIEW COMPARISON:  Chest yesterday radiograph at [facility]. FINDINGS: Cardiomegaly accentuated by lower lung volumes. Crowding of bronchovascular structures with mild vascular congestion. Minimal retrocardiac atelectasis. No pleural fluid or pneumothorax. IMPRESSION: Cardiomegaly with mild vascular congestion. Electronically Signed   By: Rubye Oaks M.D.   On: 04/29/2017 05:32   Ir Percutaneous Art Thrombectomy/infusion Intracranial Inc Diag Angio  Result Date: 04/29/2017 INDICATION: 71 year old male with a history of acute right MCA stroke, left-sided symptoms, normal baseline, with stroke scale of 13, aspects 10, within the window for tPA. He presents for angiogram and mechanical thrombectomy as a treatment for emergent large vessel occlusion. EXAM: ULTRASOUND GUIDED ACCESS RIGHT COMMON FEMORAL ARTERY CEREBRAL ANGIOGRAM MECHANICAL THROMBECTOMY RIGHT MCA BRANCHES CLOSURE OF RIGHT COMMON FEMORAL ARTERY ACCESS WITH SUTURE MEDIATED DEVICE COMPARISON:  CT IMAGING OF THE SAME DAY MEDICATIONS: 2.0 g Ancef. The antibiotic was administered within 1 hour of the procedure ANESTHESIA/SEDATION: General anesthesia with anesthesia team CONTRAST:  75 cc Isovue 300 FLUOROSCOPY TIME:  Fluoroscopy Time: 8 minutes 24 seconds (707 mGy). COMPLICATIONS: None TECHNIQUE: Informed written consent was obtained from the patient's family after a thorough discussion of the procedural risks, benefits and alternatives. Specific risks discussed include:  Bleeding, infection, contrast reaction, kidney injury/failure, need for further procedure/surgery, arterial injury or dissection, embolization to new territory, intracranial hemorrhage (10-15% risk), neurologic deterioration, cardiopulmonary collapse, death. All questions were addressed. Maximal Sterile Barrier Technique was utilized including during the procedure including caps, mask, sterile gowns, sterile gloves, sterile drape, hand hygiene and skin antiseptic. A timeout was performed prior to the initiation of the procedure. FINDINGS: Initial: Right common carotid artery:  Normal course caliber and contour. Right external carotid artery: Patent with antegrade flow. Right internal carotid artery: Mild atherosclerotic changes with plaque at the carotid bifurcation. No significant stenosis at the bifurcation. Normal course caliber and contour of the cervical portion. Vertical and petrous segment patent with normal course caliber contour. Cavernous segment patent. Clinoid segment patent. Antegrade flow of the ophthalmic artery. Ophthalmic segment patent. Terminus patent. Right MCA: M1 segment is patent. There is an early arising temporal branch, with downward course. Inferior division patent on the initial image. There is proximal occlusion of the superior and dominant segment. Right ACA: A 1 segment patent. A 2 segment perfuses the right territory. Final: Right MCA: Restoration of flow through the superior and dominant division, TICI 3 PROCEDURE: Patient was brought to the interventional suite, and identification of the patient was confirmed. Patient was placed supine position with gentle anesthesia initiated with the anesthesia team. The patient is then prepped and draped in the usual sterile fashion. Ultrasound survey of the right inguinal region was performed with images stored and sent to PACs. A micropuncture needle was used access the right common femoral artery under ultrasound. With excellent arterial blood  flow returned, and an .018 micro wire was passed through the needle, observed enter the abdominal aorta under fluoroscopy. The needle was removed, and  a micropuncture sheath was placed over the wire. The inner dilator and wire were removed, and an 035 Bentson wire was advanced under fluoroscopy into the abdominal aorta. The sheath was removed and a standard 5 [place] vascular sheath was placed. The dilator was removed and the sheath was flushed. Ultrasound survey of the left inguinal region was then performed with images stored and sent to PACs. A micropuncture needle was used access the left common femoral artery under ultrasound. With excellent arterial blood flow returned, an .018 micro wire was passed through the needle, observed to enter the abdominal aorta under fluoroscopy. The needle was removed, and a micropuncture sheath was placed over the wire. The inner dilator and wire were removed, and an 035 Bentson wire was advanced under fluoroscopy into the abdominal aorta. The sheath was removed and a standard 4 [place] vascular sheath was placed for arterial monitoring. Dilator was removed and the sheath was flushed and attached to the transducer. A 69F JB-1 diagnostic catheter was advanced over the wire to the proximal descending thoracic aorta. Wire was then removed. Double flush of the catheter was performed. Catheter was then used to select the innominate artery. Angiogram was performed. Using roadmap technique, the catheter was advanced over a roadrunner wire into right common carotid artery. Formal angiogram was performed. Exchange length Rosen wire was then passed through the diagnostic catheter to the distal common carotid artery and the diagnostic catheter was removed. The 5 French sheath was removed and exchanged for 8 French 55 centimeter BrightTip sheath. Sheath was flushed and attached to pressurized and heparinized saline bag for constant forward flow. Then an 8 [place], 85 cm Flowgate balloon tip  catheter was prepared on the back table with inflation of the balloon with 50/50 concentration of dilute contrast. The balloon catheter was then advanced over the wire, positioned into the distal common carotid artery. Copious back flush was performed and the balloon catheter was attached to heparinized and pressurized saline bag for forward flow. Flowgate balloon catheter was then advanced over the road runner wire into the mid internal carotid artery. Angiogram was performed for road map guide. Microcatheter system was then introduced through the balloon guide catheter, using a synchro soft 014 wire and a Trevo Provue18 catheter. Microcatheter system was advanced into the internal carotid artery, to the level of the occlusion. The micro wire was then carefully advanced through the occluded segment. Microcatheter was then push through the occluded segment and the wire was removed. Blood was then aspirated through the hub of the microcatheter, and a gentle contrast injection was performed confirming intraluminal position. A rotating hemostatic valve was then attached to the back end of the microcatheter, and a pressurized and heparinized saline bag was attached to the catheter. 4 x 40 solitaire device was then selected. Back flush was achieved at the rotating hemostatic valve, and then the device was gently advanced through the microcatheter to the distal end. The retriever was then unsheathed by withdrawing the microcatheter under fluoroscopy. Once the retriever was completely unsheathed, control angiogram was performed from the balloon catheter. The balloon at the balloon tip catheter was then inflated under fluoroscopy for proximal flow arrest. Constant aspiration was then performed at the tip of the balloon guide catheter as the retriever was gently and slowly withdrawn with fluoroscopic observation. Once the retriever was entirely removed from the system, free aspiration was confirmed at the hub of the balloon  guide catheter, with free blood return confirmed. The balloon was then deflated, rotating  hemostatic valve was reattached, and a control angiogram was performed. Control angiogram was performed at the right common femoral artery puncture site. Two overlapping suture mediated devices were used for right common femoral artery closure. Patient tolerated the procedure well and remained hemodynamically stable throughout. No complications were encountered and no significant blood loss encountered. IMPRESSION: Status post cerebral angiogram and mechanical thrombectomy of proximal occlusion of the superior and dominant MCA branch of the right hemisphere with restoration of TICI 3 flow. Suture mediated closure of right common femoral artery. Signed, Yvone Neu. Loreta Ave, DO [facility] PLAN: Extubated in the interventional suite CT post procedure ICU admission Right leg straight until 8 a.m. Electronically Signed   By: Gilmer Mor D.O.   On: 04/29/2017 04:07   Ct Head Code Stroke Wo Contrast  Result Date: 04/29/2017 CLINICAL DATA:  Code stroke. EXAM: CT HEAD WITHOUT CONTRAST CT CEREBRAL PERFUSION WITH CONTRAST TECHNIQUE: Contiguous axial images were obtained from the base of the skull through the vertex without intravenous contrast. Following the administration of contrast material, CT perfusion scanning of the brain was performed. COMPARISON:  None. FINDINGS: Brain: No mass lesion or acute hemorrhage. No focal hypoattenuation of the basal ganglia or cortex to indicate infarcted tissue. There is periventricular hypoattenuation compatible with chronic microvascular disease. No hydrocephalus or age advanced atrophy. Vascular: No hyperdense vessel. No advanced atherosclerotic calcification of the arteries at the skull base. Skull: Normal visualized skull base, calvarium and extracranial soft tissues. Sinuses/Orbits: No sinus fluid levels or advanced mucosal thickening.  No mastoid effusion. Normal orbits. ASPECTS (Alberta Stroke Program Early CT Score) - Ganglionic level infarction (caudate, lentiform nuclei, internal capsule, insula, M1-M3 cortex): 7 - Supraganglionic infarction (M4-M6 cortex): 3 Total score (0-10 with 10 being normal): 10 CT Brain Perfusion Findings: CBF (<30%) Volume: 0mL Perfusion (Tmax>6.0s) volume: 63mL Mismatch Volume: 63mL Ischemia Location:Anterior right MCA distribution IMPRESSION: 1. No acute hemorrhage or mass lesion. 2. ASPECTS is 10. 3. Elevated mean transit time within the anterior right MCA distribution without associated abnormal cerebral blood volume or cerebral blood flow likely indicates an area of acute ischemia without infarction. These results were called by telephone at the time of interpretation on 04/29/2017 at 12:55 am to Dr. Ritta Slot , who verbally acknowledged these results. Electronically Signed   By: Deatra Robinson M.D.   On: 04/29/2017 01:16    ECG & Cardiac Imaging    EKG: Afib RVR  Echo: 04/29/17  Study Conclusions  - Left ventricle: The cavity size was mildly dilated. Wall   thickness was increased in a pattern of mild LVH. Inferolateral   akinesis, anterolateral severe hypokinesis. Systolic function was   moderately to severely reduced. The estimated ejection fraction   was in the range of 30% to 35%. Features are consistent with a   pseudonormal left ventricular filling pattern, with concomitant   abnormal relaxation and increased filling pressure (grade 2   diastolic dysfunction). E/medial e&' > 15 suggestive of LV end   diastolic pressure at least 20 mmHg. - Aortic valve: There was no stenosis. - Mitral valve: There was at least moderate posteriorly-directed   mitral regurgitation. Given wall motion abnormalities, suspect   this may be infarct-related mitral regurgitation. - Left atrium: The atrium was moderately dilated. - Right ventricle: The cavity size was normal. Systolic function   was  normal. - Pulmonary arteries: No complete TR doppler jet so unable to   estimate PA systolic pressure. - Systemic veins: IVC  measured 2.2 cm with > 50% respirophasic   variation, suggesting RA pressure 8 mmHg.  Impressions:  - Mildly dilated LV with mild LV hypertrophy. EF 30-35%.   Inferolateral akinesis, anterolateral severe hypokinesis. Normal   RV size and systolic function. At least moderate, eccentric   posteriorly-directed mitral regurgitation. Wall motion   abnormalities raise concern for infarct-related MR.  Assessment & Plan    71 yo male with PMH of chronic combined HF (viral cardiomyopathy), HTN, and DM who presented with Stroke, now in Afib RVR.   1. Afib RVR: Developed sudden onset this morning with rates in the 140s-150s. Was symptomatic with this rate. Has been started on IV Dilt with rates improved into the 120s. Recommend increasing BB therapy in addition to Dilt gtt. Hopefully will convert spontaneously. Will need to consider OAC, but defer to neurology when to start this.  This patients CHA2DS2-VASc Score and unadjusted Ischemic Stroke Rate (% per year) is equal to 9.7 % stroke rate/year from a score of 6.   2. Stroke: Received TPA and had mechanical thrombectomy. Plans per neurology.  On full dose ASA, and plavix currently  3. HTN: stable with current therapy  4. Chronic combined HF: Volume stable on exam  Signed, Laverda Page, NP-C Pager [phone] 05/04/2017, 3:12 PM As above, patient seen and examined. Briefly he is a 71 year old male with past medical history of cardiomyopathy, diabetes mellitus, hypertension, mitral regurgitation admitted with CVA who I am asked to consult on concerning atrial fibrillation. Patient recently presented with left-sided weakness and received thrombolytic therapy. He had a distal him to occlusion and underwent mechanical thrombectomy. Echocardiogram this admission shows ejection fraction 30-35% and moderate mitral  regurgitation. Patient developed atrial fibrillation this morning and cardiology asked to evaluate. Prior to this admission the patient denies dyspnea or chest pain. He states he has not had palpitations in the past but has had this morning.  Electrocardiogram shows atrial fibrillation with rapid ventricular response and incomplete left bundle branch block.   1 new-onset atrial fibrillation-this is likely the explanation of his recent CVA. Echocardiogram shows ejection fraction 30-35%. We'll continue present dose of carvedilol. Cardizem has been added. If his rate is difficult to control her blood pressure becomes an issue can add amiodarone. Once he converts to sinus rhythm would discontinue Cardizem given cardiomyopathy and increase carvedilol. Check TSH. CHADS vasc 6. Would add apixaban when ok with neurology.  2 cardiomyopathy-we will need to review with Dr. Dulce Sellar. Question if patient has had a prior ischemia evaluation. This could be performed as an outpatient. Once back in sinus rhythm we will titrate beta blocker and ACE inhibitor as tolerated.  3 recent CVA-management per neurology.  4 hypertension-blood pressure is controlled.  5 hyperlipidemia-continue statin.  Olga Millers, MD

## 2017-05-01 NOTE — Progress Notes (Signed)
STROKE TEAM PROGRESS NOTE   SUBJECTIVE (INTERVAL HISTORY) Wife is at bedside. Over night found to have afib RVR, transferred to step down for cardizem drip. BP was on the low side. Will start some IVF. Cardiology on board. titrating cardizem dose. Due to petechial hemorrhagic conversion, will hold off [facility] for now. Consider to repeat CT in 2 days and start eliquis if no frank hemorrhage seen on CT.    OBJECTIVE Recent Labs  Lab 04/30/17 1221 04/30/17 1541 04/30/17 1744 05/01/17 0013 05/01/17 0601  GLUCAP 153* 206* 188* 141* 170*   Recent Labs  Lab 04/29/17 0112 04/30/17 0428  NA 133* 136  K 4.4 4.0  CL 103 106  CO2 23 24  GLUCOSE 273* 165*  BUN 16 19  CREATININE 0.89 0.81  CALCIUM 8.8* 8.8*   Recent Labs  Lab 04/29/17 0112  AST 22  ALT 22  ALKPHOS 92  BILITOT 0.9  PROT 7.2  ALBUMIN 3.7   Recent Labs  Lab 04/29/17 0112 04/30/17 0428  WBC 14.3* 21.6*  HGB 13.8 11.1*  HCT 39.6 32.3*  MCV 91.0 91.8  PLT 205 184   No results for input(s): CKTOTAL, CKMB, CKMBINDEX, TROPONINI in the last 168 hours. No results for input(s): LABPROT, INR in the last 72 hours. No results for input(s): COLORURINE, LABSPEC, PHURINE, GLUCOSEU, HGBUR, BILIRUBINUR, KETONESUR, PROTEINUR, UROBILINOGEN, NITRITE, LEUKOCYTESUR in the last 72 hours.  Invalid input(s): APPERANCEUR     Component Value Date/Time   CHOL 102 04/29/2017 0451   TRIG 104 04/29/2017 0451   HDL 30 (L) 04/29/2017 0451   CHOLHDL 3.4 04/29/2017 0451   VLDL 21 04/29/2017 0451   LDLCALC 51 04/29/2017 0451   Lab Results  Component Value Date   HGBA1C 8.4 (H) 04/29/2017   No results found for: LABOPIA, COCAINSCRNUR, LABBENZ, AMPHETMU, THCU, LABBARB  No results for input(s): ETH in the last 168 hours.  IMAGING I have personally reviewed the radiological images below and agree with the radiology interpretations.  Ct Head Wo Contrast 04/29/2017 IMPRESSION:  1. Evolving infarction and right superior insula and frontal  operculum measuring 4.6 cc within the region of perfusion anomaly on prior CT perfusion.  2. No new large territory acute infarction, hemorrhage, or mass effect.  3. Mild chronic microvascular ischemic changes and parenchymal volume loss of the brain.   Ct Head Wo Contrast 04/29/2017 IMPRESSION:  No intracranial hemorrhage or cytotoxic edema following vascular intervention.   [name] Head Wo Contrast 04/30/2017 MRI HEAD:  1. Mildly motion degraded examination.  2. Acute patchy RIGHT frontal lobe infarct, with petechial hemorrhage within insular component.  3. Mild to moderate chronic small vessel ischemic disease.  MRA HEAD:  1. Moderately motion degraded examination.  2. No emergent large vessel occlusion or flow-limiting stenosis.  3. Focal stenosis versus motion artifact RIGHT M2 origin.   Cerebral angiogram 04/29/2017 Status post cerebral angiogram and mechanical thrombectomy of proximal occlusion of the superior and dominant MCA branch of the right hemisphere with restoration of TICI 3 flow.   Ct Head Code Stroke Wo Contrast Ct Cerebral Perfusion W Contrast 04/29/2017 IMPRESSION:  1. No acute hemorrhage or mass lesion.  2. ASPECTS is 10.  3. Elevated mean transit time within the anterior right MCA distribution without associated abnormal cerebral blood volume or cerebral blood flow likely indicates an area of acute ischemia without infarction.   Dg Chest Port 1 View 04/29/2017 IMPRESSION:  Cardiomegaly with mild vascular congestion.   Transthoracic Echocardiogram  Impressions: - Mildly  dilated LV with mild LV hypertrophy. EF 30-35%.   Inferolateral akinesis, anterolateral severe hypokinesis. Normal   RV size and systolic function. At least moderate, eccentric   posteriorly-directed mitral regurgitation. Wall motion   abnormalities raise concern for infarct-related MR.     Bilateral Lower Extremity Dopplers - No evidence of  DVT in the veins that were clearly visualized.  The right common femoral vein was not imaged due to surgical bandages in place  EEG 04/29/2017  The EEG is abnormal and findings are suggestive of generalized and right fronto temporal focal cerebral dysfunction.Epileptiform activity was not seen during this recording     PHYSICAL EXAM Temp:  [97.3 F (36.3 C)-99.2 F (37.3 C)] 98 F (36.7 C) (11/04 0400) Pulse Rate:  [82-148] 141 (11/04 0700) Resp:  [15-23] 20 (11/04 0504) BP: (81-147)/(45-125) 102/66 (11/04 0745) SpO2:  [94 %-99 %] 94 % (11/04 0700)    Vitals:   05/01/17 0700 05/01/17 0715 05/01/17 0730 05/01/17 0745  BP: 110/77 96/68 107/62 102/66  Pulse: (!) 141     Resp:      Temp:      TempSrc:      SpO2: 94%     Weight:      Height:         General - Well nourished, well developed, in no apparent distress Respiratory - Unlabored breathing noted on exam Cardiovascular - Regular rate and rhythm   Neuro:   Mental Status: Patient is awake, alert, oriented to person, place, month, year, and situation. No signs of aphasia but patient does have mildly dysarthric speech.  Cranial Nerves: II: visual field full III,IV, VI: right gaze preference but able to cross midline, PERRL. V: Facial sensation symmetrical VII: Facial movement - Left facial droop VIII: hearing is intact to voice X: Uvula elevates symmetrically XI: Shoulder shrug is symmetric. XII: tongue is midline without atrophy or fasciculations.  Motor: He has normal strength in the right side, Left Arm was 4/5,   hand 3/5 and Left Leg 4+/5.   sensory: Sensation is symmetrical Cerebellar: No ataxia on the bilaterally, but slow on the left    ASSESSMENT AND PLAN: [name] is a 71 y.o. male with diabetes mellitus and cardiomyopathy EF of 30% who presents with left-sided weakness. He was taken to [facility] ER where he was given IV TPA 04/28/2017.  He had a CTA showing a relatively distal M2 occlusion, with very mild symptoms.  He was accepted  for transferred to [facility].  En route, he markedly worsened, after he arrived he was taken for a stat CT perfusion which demonstrates a large penumbra.IR was called and he was taken for IR intervention. The patient was extubated post procedure   Stroke:  Acute patchy RIGHT MCA infarct s/p tPA and mechanical thrombectomy with TICI3 reperfusion - likely embolic secondary to large vessel disease vs. cardiomyopathy with low EF.  Resultant right gaze preference, left facial droop, left hemiparesis  CT head - No acute hemorrhage or mass lesion.   CT head and neck - distal right M2 occlusion  CTP right MCA penumbra  Cerebral angiogram - right superior M2 occlusion s/p TICI3 revascularization  MRI head - Acute patchy RIGHT frontal lobe infarct with petechial hemorrhage within insular component.   MRA head - Focal stenosis versus motion artifact RIGHT M2 origin.   2D Echo - EF of 30-35%. No cardiac source of emboli identified.  LE venous Doppler negative for DVT  LDL -  51  HgbA1c - 8.4  VTE prophylaxis -heparin subq  Diet Carb Modified Fluid consistency: Thin; Room service appropriate? Yes  aspirin 81 mg daily prior to admission, now on aspirin 325 mg daily and plavix 75mg  daily. Will consider eliquis in 2 days if repeat CT showing no hemorrhage   Patient counseled to be compliant with his antithrombotic medications  Ongoing aggressive stroke risk factor management  Therapy recommendations:  - HH PT  Disposition: Pending  afib RVR  New diagnosis  On cardizem drip  Cardiology on board, appreciate recs  Continue coreg  Due to petechial hemorrhagic conversion, will continue DAPT for now. Will consider eliquis in 2 days if repeat CT head showing in hemorrhage in 2 days.  Cardiomyopathy  EF 30-35%  Following with Dr. Dulce SellarMunley in [place]  On low dose coreg and lisinopril  Cardiology on board  Close follow-up with cardiology  Uncontrolled diabetes  Hyperglycemia  on admission  HgbA1c 8.4, goal < 7.0  Off insulin drip  Back on home meds  SSI  CBG monitoring  Hypotension  Likely due to low ejection fraction and on cardizem and on low-dose Coreg and lisinopril  BP goal normotensive  May consider amiodarone if continues low on cardizem  Add moderate IVF  Encourage po intake  CXR pending  Hyperlipidemia  Home meds:  Zocor 40 mg daily resumed in hospital  LDL 51,  goal < 70  Continue statin at discharge  Other Stroke Risk Factors  Advanced age  ETOH use, advised to drink no more than 1 drink per day.  Obesity, Body mass index is 34.65 kg/m., recommend weight loss, diet and exercise as appropriate   Other Active Problems  Leukocytosis - 14.3->21.6 (afebrile)   Hospital day # 3  This patient is critically ill due to right MCA infarct, hemorrhagic conversion, cardiomyopathy with low EF, new afib RVR, uncontrolled diabetes, leukocytosis and at significant risk of neurological worsening, death form recurrent infarct, hemorrhagic conversion, heart failure, DKA, sepsis. This patient's care requires constant monitoring of vital signs, hemodynamics, respiratory and cardiac monitoring, review of multiple databases, neurological assessment, discussion with family, other specialists and medical decision making of high complexity. I had long discussion with pt and wife at bedside, updated pt current condition, treatment plan and potential prognosis. They expressed understanding and appreciation.  I spent 40 minutes of neurocritical care time in the care of this patient.  Marvel PlanJindong [name], MD PhD Stroke Neurology 05/01/2017 10:23 AM  To contact Stroke Continuity provider, please refer to [email or web address]. After hours, contact General Neurology

## 2017-05-01 NOTE — Progress Notes (Signed)
Pt had sustained afib/tachycardiia starting @ 0444. 5mg  IV lopressor administered @ 0513. HR  Lowered to 112-122. Pt c/o dull pain in rt shoulder. 12 Lead EKG obtained, result was Afib. MD ordered transfer of pt to 2 Heart floor for cardizem drip. Cardizem drip started on 3W until 2 Heart can accept pt. Will continue to monitor.

## 2017-05-01 NOTE — Progress Notes (Signed)
Referring Physician(s): Code stroke  Supervising Physician: Jolaine Click  Patient Status:  [facility] - In-pt  Chief Complaint:  Stroke  Subjective:  Nathan [name] is sitting up in bed.  He is thanking me today for "everything we did". He states "I couldn't move my left arm at all when I came in, I'm so much better today".  Allergies: Patient has no allergy information on record.  Medications: Prior to Admission medications   Medication Sig Start Date End Date Taking? Authorizing Provider  aspirin EC 81 MG tablet Take 81 mg by mouth daily.   Yes [provider]  carvedilol (COREG) 25 MG tablet Take 25 mg by mouth 2 (two) times daily. 02/26/17  Yes [provider]  cyclobenzaprine (FLEXERIL) 10 MG tablet Take 10 mg by mouth 2 (two) times daily as needed. 04/11/17  Yes [provider]  glipiZIDE (GLUCOTROL XL) 10 MG 24 hr tablet Take 10 mg by mouth daily. 03/30/17  Yes [provider]  lisinopril (PRINIVIL,ZESTRIL) 5 MG tablet Take 5 mg by mouth daily. 10/12/16  Yes [provider]  meclizine (ANTIVERT) 25 MG tablet Take 25 mg by mouth daily as needed. 04/07/17  Yes [provider]  meloxicam (MOBIC) 15 MG tablet Take 15 mg by mouth daily as needed. 04/11/17  Yes [provider]  mupirocin ointment (BACTROBAN) 2 % Apply 1 application topically daily as needed. 04/11/17  Yes [provider]  naproxen sodium (ALEVE) 220 MG tablet Take 220 mg by mouth daily as needed (pain).   Yes [provider]  repaglinide (PRANDIN) 0.5 MG tablet Take 0.5 tablets by mouth 3 (three) times daily with meals. 01/27/17  Yes [provider]  simvastatin (ZOCOR) 40 MG tablet Take 40 mg by mouth at bedtime.  01/27/17  Yes [provider]  tamsulosin (FLOMAX) 0.4 MG CAPS capsule Take 0.4 mg by mouth daily. 03/30/17  Yes [provider]     Vital Signs: BP 93/74 (BP Location: Left Arm)   Pulse 86   Temp 98.6 F  (37 C) (Oral)   Resp 20   Ht 5\' 11"  (1.803 m)   Wt 254 lb 4.8 oz (115.3 kg)   SpO2 98%   BMI 35.47 kg/m   Physical Exam Awake and alert NAD Tongue appears to be midline Still with mild left facial droop Mild ataxia on the left UE and LE but is improved from yesterday. Good LE strength bilaterally 5/5 RUE 5/5, LUE 4/5 Left hand if very edematous and ecchymotic  Right groin with clean bulky dressings in place. Groin is soft, non-tender, moderate ecchymosis. No bleeding, no pseudoaneurysm.  Imaging: Ct Head Wo Contrast  Result Date: 04/29/2017 CLINICAL DATA:  71 y/o  M; stroke follow-up. EXAM: CT HEAD WITHOUT CONTRAST TECHNIQUE: Contiguous axial images were obtained from the base of the skull through the vertex without intravenous contrast. COMPARISON:  04/29/2017 CT head and CT perfusion head. FINDINGS: Brain: Area of hypoattenuation within the right superior insula and frontal operculum measuring 2.1 x 1.9 x 2.2 cm (volume = 4.6 cm^3) compatible infarction within the region of perfusion anomaly on prior CT. No new acute intracranial hemorrhage, infarction, or significant mass effect identified. Background of mild chronic microvascular ischemic changes and parenchymal volume loss. Vascular: Mild calcific atherosclerosis of carotid siphons. No hyperdense vessel identified. Skull: Normal. Negative for fracture or focal lesion. Sinuses/Orbits: No acute finding. Other: None. IMPRESSION: 1. Evolving infarction and right superior insula and frontal operculum measuring 4.6 cc  within the region of perfusion anomaly on prior CT perfusion. 2. No new large territory acute infarction, hemorrhage, or mass effect. 3. Mild chronic microvascular ischemic changes and parenchymal volume loss of the brain. Electronically Signed   By: Mitzi Hansen M.D.   On: 04/29/2017 19:05   Ct Head Wo Contrast  Result Date: 04/29/2017 CLINICAL DATA:  Right MCA stroke with percutaneous intervention. EXAM: CT HEAD  WITHOUT CONTRAST TECHNIQUE: Contiguous axial images were obtained from the base of the skull through the vertex without intravenous contrast. COMPARISON:  Cerebral angiogram 04/29/2017 Head CT 04/29/2017 FINDINGS: Brain: There is no intracranial hemorrhage. Gray-white differentiation is preserved. Basal ganglia and insular ribbons are preserved. Vascular: No hyperdense vessel or unexpected calcification.There is mild intravascular enhancement due to presence of circulating contrast. Skull: Normal visualized skull base, calvarium and extracranial soft tissues. Sinuses/Orbits: No sinus fluid levels or advanced mucosal thickening. No mastoid effusion. Normal orbits. IMPRESSION: No intracranial hemorrhage or cytotoxic edema following vascular intervention. Electronically Signed   By: Deatra Robinson M.D.   On: 04/29/2017 04:05   Nathan [name] Head Wo Contrast  Result Date: 04/30/2017 CLINICAL DATA:  Follow-up acute RIGHT MCA stroke and mechanical thrombectomy of MCA occlusion. EXAM: MRI HEAD WITHOUT CONTRAST MRA HEAD WITHOUT CONTRAST TECHNIQUE: Multiplanar, multiecho pulse sequences of the brain and surrounding structures were obtained without intravenous contrast. Angiographic images of the head were obtained using MRA technique without contrast. COMPARISON:  CT HEAD April 29, 2017 and CT angiogram of the head April 28, 2017 FINDINGS: MRI HEAD FINDINGS- mildly motion degraded examination. BRAIN: Patchy RIGHT frontal reduced diffusion, 1.5 x 2 cm confluent component RIGHT insula with low ADC values and susceptibility artifact within the insular component. LEFT frontal lobe chronic microhemorrhage. Patchy supratentorial white matter FLAIR T2 hyperintensities exclusive of aforementioned abnormality. Ventricles and sulci are normal for patient's age. No midline shift, mass effect or masses. No abnormal extra-axial fluid collections. VASCULAR: See below. SKULL AND UPPER CERVICAL SPINE: No abnormal sellar expansion. No  suspicious calvarial bone marrow signal. Craniocervical junction maintained. SINUSES/ORBITS: The mastoid air-cells and included paranasal sinuses are well-aerated. The included ocular globes and orbital contents are non-suspicious. OTHER: None. MRA HEAD FINDINGS- moderately motion degraded examination. ANTERIOR CIRCULATION: Normal flow related enhancement of the included cervical, petrous, cavernous and supraclinoid internal carotid arteries. Patent anterior communicating artery. Patent anterior and middle cerebral arteries, including distal segments. Focal stenosis versus motion artifact RIGHT M2 origin seen on maximum intensity projected reformations. No large vessel occlusion, flow limiting stenosis. POSTERIOR CIRCULATION: LEFT vertebral artery is dominant. Basilar artery is patent, with normal flow related enhancement of the main branch vessels. Patent posterior cerebral arteries. Bilateral posterior communicating artery's present. No large vessel occlusion, flow limiting stenosis. ANATOMIC VARIANTS: None. Source images and MIP images were reviewed. IMPRESSION: MRI HEAD: 1. Mildly motion degraded examination. 2. Acute patchy RIGHT frontal lobe infarct, with petechial hemorrhage within insular component. 3. Mild to moderate chronic small vessel ischemic disease. MRA HEAD: 1. Moderately motion degraded examination. 2. No emergent large vessel occlusion or flow-limiting stenosis. 3. Focal stenosis versus motion artifact RIGHT M2 origin. Electronically Signed   By: Awilda Metro M.D.   On: 04/30/2017 04:32   Nathan Brain Wo Contrast  Result Date: 04/30/2017 CLINICAL DATA:  Follow-up acute RIGHT MCA stroke and mechanical thrombectomy of MCA occlusion. EXAM: MRI HEAD WITHOUT CONTRAST MRA HEAD WITHOUT CONTRAST TECHNIQUE: Multiplanar, multiecho pulse sequences of the brain and surrounding structures were obtained without intravenous contrast. Angiographic images of the head  were obtained using MRA technique without  contrast. COMPARISON:  CT HEAD April 29, 2017 and CT angiogram of the head April 28, 2017 FINDINGS: MRI HEAD FINDINGS- mildly motion degraded examination. BRAIN: Patchy RIGHT frontal reduced diffusion, 1.5 x 2 cm confluent component RIGHT insula with low ADC values and susceptibility artifact within the insular component. LEFT frontal lobe chronic microhemorrhage. Patchy supratentorial white matter FLAIR T2 hyperintensities exclusive of aforementioned abnormality. Ventricles and sulci are normal for patient's age. No midline shift, mass effect or masses. No abnormal extra-axial fluid collections. VASCULAR: See below. SKULL AND UPPER CERVICAL SPINE: No abnormal sellar expansion. No suspicious calvarial bone marrow signal. Craniocervical junction maintained. SINUSES/ORBITS: The mastoid air-cells and included paranasal sinuses are well-aerated. The included ocular globes and orbital contents are non-suspicious. OTHER: None. MRA HEAD FINDINGS- moderately motion degraded examination. ANTERIOR CIRCULATION: Normal flow related enhancement of the included cervical, petrous, cavernous and supraclinoid internal carotid arteries. Patent anterior communicating artery. Patent anterior and middle cerebral arteries, including distal segments. Focal stenosis versus motion artifact RIGHT M2 origin seen on maximum intensity projected reformations. No large vessel occlusion, flow limiting stenosis. POSTERIOR CIRCULATION: LEFT vertebral artery is dominant. Basilar artery is patent, with normal flow related enhancement of the main branch vessels. Patent posterior cerebral arteries. Bilateral posterior communicating artery's present. No large vessel occlusion, flow limiting stenosis. ANATOMIC VARIANTS: None. Source images and MIP images were reviewed. IMPRESSION: MRI HEAD: 1. Mildly motion degraded examination. 2. Acute patchy RIGHT frontal lobe infarct, with petechial hemorrhage within insular component. 3. Mild to moderate chronic  small vessel ischemic disease. MRA HEAD: 1. Moderately motion degraded examination. 2. No emergent large vessel occlusion or flow-limiting stenosis. 3. Focal stenosis versus motion artifact RIGHT M2 origin. Electronically Signed   By: Awilda Metro M.D.   On: 04/30/2017 04:32   Ir US Guide Vasc Access Left  Result Date: 04/29/2017 INDICATION: 71 year old male with a history of acute right MCA stroke, left-sided symptoms, normal baseline, with stroke scale of 13, aspects 10, within the window for tPA. He presents for angiogram and mechanical thrombectomy as a treatment for emergent large vessel occlusion. EXAM: ULTRASOUND GUIDED ACCESS RIGHT COMMON FEMORAL ARTERY CEREBRAL ANGIOGRAM MECHANICAL THROMBECTOMY RIGHT MCA BRANCHES CLOSURE OF RIGHT COMMON FEMORAL ARTERY ACCESS WITH SUTURE MEDIATED DEVICE COMPARISON:  CT IMAGING OF THE SAME DAY MEDICATIONS: 2.0 g Ancef. The antibiotic was administered within 1 hour of the procedure ANESTHESIA/SEDATION: General anesthesia with anesthesia team CONTRAST:  75 cc Isovue 300 FLUOROSCOPY TIME:  Fluoroscopy Time: 8 minutes 24 seconds (707 mGy). COMPLICATIONS: None TECHNIQUE: Informed written consent was obtained from the patient's family after a thorough discussion of the procedural risks, benefits and alternatives. Specific risks discussed include: Bleeding, infection, contrast reaction, kidney injury/failure, need for further procedure/surgery, arterial injury or dissection, embolization to new territory, intracranial hemorrhage (10-15% risk), neurologic deterioration, cardiopulmonary collapse, death. All questions were addressed. Maximal Sterile Barrier Technique was utilized including during the procedure including caps, mask, sterile gowns, sterile gloves, sterile drape, hand hygiene and skin antiseptic. A timeout was performed prior to the initiation of the procedure. FINDINGS: Initial: Right common carotid artery:  Normal course caliber and contour. Right external  carotid artery: Patent with antegrade flow. Right internal carotid artery: Mild atherosclerotic changes with plaque at the carotid bifurcation. No significant stenosis at the bifurcation. Normal course caliber and contour of the cervical portion. Vertical and petrous segment patent with normal course caliber contour. Cavernous segment patent. Clinoid segment patent. Antegrade flow of the ophthalmic artery. Ophthalmic  segment patent. Terminus patent. Right MCA: M1 segment is patent. There is an early arising temporal branch, with downward course. Inferior division patent on the initial image. There is proximal occlusion of the superior and dominant segment. Right ACA: A 1 segment patent. A 2 segment perfuses the right territory. Final: Right MCA: Restoration of flow through the superior and dominant division, TICI 3 PROCEDURE: Patient was brought to the interventional suite, and identification of the patient was confirmed. Patient was placed supine position with gentle anesthesia initiated with the anesthesia team. The patient is then prepped and draped in the usual sterile fashion. Ultrasound survey of the right inguinal region was performed with images stored and sent to PACs. A micropuncture needle was used access the right common femoral artery under ultrasound. With excellent arterial blood flow returned, and an .018 micro wire was passed through the needle, observed enter the abdominal aorta under fluoroscopy. The needle was removed, and a micropuncture sheath was placed over the wire. The inner dilator and wire were removed, and an 035 Bentson wire was advanced under fluoroscopy into the abdominal aorta. The sheath was removed and a standard 5 [place] vascular sheath was placed. The dilator was removed and the sheath was flushed. Ultrasound survey of the left inguinal region was then performed with images stored and sent to PACs. A micropuncture needle was used access the left common femoral artery under  ultrasound. With excellent arterial blood flow returned, an .018 micro wire was passed through the needle, observed to enter the abdominal aorta under fluoroscopy. The needle was removed, and a micropuncture sheath was placed over the wire. The inner dilator and wire were removed, and an 035 Bentson wire was advanced under fluoroscopy into the abdominal aorta. The sheath was removed and a standard 4 [place] vascular sheath was placed for arterial monitoring. Dilator was removed and the sheath was flushed and attached to the transducer. A 89F JB-1 diagnostic catheter was advanced over the wire to the proximal descending thoracic aorta. Wire was then removed. Double flush of the catheter was performed. Catheter was then used to select the innominate artery. Angiogram was performed. Using roadmap technique, the catheter was advanced over a roadrunner wire into right common carotid artery. Formal angiogram was performed. Exchange length Rosen wire was then passed through the diagnostic catheter to the distal common carotid artery and the diagnostic catheter was removed. The 5 French sheath was removed and exchanged for 8 French 55 centimeter BrightTip sheath. Sheath was flushed and attached to pressurized and heparinized saline bag for constant forward flow. Then an 8 [place], 85 cm Flowgate balloon tip catheter was prepared on the back table with inflation of the balloon with 50/50 concentration of dilute contrast. The balloon catheter was then advanced over the wire, positioned into the distal common carotid artery. Copious back flush was performed and the balloon catheter was attached to heparinized and pressurized saline bag for forward flow. Flowgate balloon catheter was then advanced over the road runner wire into the mid internal carotid artery. Angiogram was performed for road map guide. Microcatheter system was then introduced through the balloon guide catheter, using a synchro soft 014 wire and a Trevo Provue18  catheter. Microcatheter system was advanced into the internal carotid artery, to the level of the occlusion. The micro wire was then carefully advanced through the occluded segment. Microcatheter was then push through the occluded segment and the wire was removed. Blood was then aspirated through the hub of the microcatheter, and a gentle  contrast injection was performed confirming intraluminal position. A rotating hemostatic valve was then attached to the back end of the microcatheter, and a pressurized and heparinized saline bag was attached to the catheter. 4 x 40 solitaire device was then selected. Back flush was achieved at the rotating hemostatic valve, and then the device was gently advanced through the microcatheter to the distal end. The retriever was then unsheathed by withdrawing the microcatheter under fluoroscopy. Once the retriever was completely unsheathed, control angiogram was performed from the balloon catheter. The balloon at the balloon tip catheter was then inflated under fluoroscopy for proximal flow arrest. Constant aspiration was then performed at the tip of the balloon guide catheter as the retriever was gently and slowly withdrawn with fluoroscopic observation. Once the retriever was entirely removed from the system, free aspiration was confirmed at the hub of the balloon guide catheter, with free blood return confirmed. The balloon was then deflated, rotating hemostatic valve was reattached, and a control angiogram was performed. Control angiogram was performed at the right common femoral artery puncture site. Two overlapping suture mediated devices were used for right common femoral artery closure. Patient tolerated the procedure well and remained hemodynamically stable throughout. No complications were encountered and no significant blood loss encountered. IMPRESSION: Status post cerebral angiogram and mechanical thrombectomy of proximal occlusion of the superior and dominant MCA branch  of the right hemisphere with restoration of TICI 3 flow. Suture mediated closure of right common femoral artery. Signed, Yvone Neu. Loreta Ave, DO [facility] Radiology PLAN: Extubated in the interventional suite CT post procedure ICU admission Right leg straight until 8 a.m. Electronically Signed   By: Gilmer Mor D.O.   On: 04/29/2017 04:07   Ir US Guide Vasc Access Right  Result Date: 04/29/2017 INDICATION: 71 year old male with a history of acute right MCA stroke, left-sided symptoms, normal baseline, with stroke scale of 13, aspects 10, within the window for tPA. He presents for angiogram and mechanical thrombectomy as a treatment for emergent large vessel occlusion. EXAM: ULTRASOUND GUIDED ACCESS RIGHT COMMON FEMORAL ARTERY CEREBRAL ANGIOGRAM MECHANICAL THROMBECTOMY RIGHT MCA BRANCHES CLOSURE OF RIGHT COMMON FEMORAL ARTERY ACCESS WITH SUTURE MEDIATED DEVICE COMPARISON:  CT IMAGING OF THE SAME DAY MEDICATIONS: 2.0 g Ancef. The antibiotic was administered within 1 hour of the procedure ANESTHESIA/SEDATION: General anesthesia with anesthesia team CONTRAST:  75 cc Isovue 300 FLUOROSCOPY TIME:  Fluoroscopy Time: 8 minutes 24 seconds (707 mGy). COMPLICATIONS: None TECHNIQUE: Informed written consent was obtained from the patient's family after a thorough discussion of the procedural risks, benefits and alternatives. Specific risks discussed include: Bleeding, infection, contrast reaction, kidney injury/failure, need for further procedure/surgery, arterial injury or dissection, embolization to new territory, intracranial hemorrhage (10-15% risk), neurologic deterioration, cardiopulmonary collapse, death. All questions were addressed. Maximal Sterile Barrier Technique was utilized including during the procedure including caps, mask, sterile gowns, sterile gloves, sterile drape, hand hygiene and skin antiseptic. A timeout was performed prior to the initiation of the  procedure. FINDINGS: Initial: Right common carotid artery:  Normal course caliber and contour. Right external carotid artery: Patent with antegrade flow. Right internal carotid artery: Mild atherosclerotic changes with plaque at the carotid bifurcation. No significant stenosis at the bifurcation. Normal course caliber and contour of the cervical portion. Vertical and petrous segment patent with normal course caliber contour. Cavernous segment patent. Clinoid segment patent. Antegrade flow of the ophthalmic artery. Ophthalmic segment patent. Terminus patent. Right MCA: M1 segment is patent. There is an early  arising temporal branch, with downward course. Inferior division patent on the initial image. There is proximal occlusion of the superior and dominant segment. Right ACA: A 1 segment patent. A 2 segment perfuses the right territory. Final: Right MCA: Restoration of flow through the superior and dominant division, TICI 3 PROCEDURE: Patient was brought to the interventional suite, and identification of the patient was confirmed. Patient was placed supine position with gentle anesthesia initiated with the anesthesia team. The patient is then prepped and draped in the usual sterile fashion. Ultrasound survey of the right inguinal region was performed with images stored and sent to PACs. A micropuncture needle was used access the right common femoral artery under ultrasound. With excellent arterial blood flow returned, and an .018 micro wire was passed through the needle, observed enter the abdominal aorta under fluoroscopy. The needle was removed, and a micropuncture sheath was placed over the wire. The inner dilator and wire were removed, and an 035 Bentson wire was advanced under fluoroscopy into the abdominal aorta. The sheath was removed and a standard 5 [place] vascular sheath was placed. The dilator was removed and the sheath was flushed. Ultrasound survey of the left inguinal region was then performed with  images stored and sent to PACs. A micropuncture needle was used access the left common femoral artery under ultrasound. With excellent arterial blood flow returned, an .018 micro wire was passed through the needle, observed to enter the abdominal aorta under fluoroscopy. The needle was removed, and a micropuncture sheath was placed over the wire. The inner dilator and wire were removed, and an 035 Bentson wire was advanced under fluoroscopy into the abdominal aorta. The sheath was removed and a standard 4 [place] vascular sheath was placed for arterial monitoring. Dilator was removed and the sheath was flushed and attached to the transducer. A 43F JB-1 diagnostic catheter was advanced over the wire to the proximal descending thoracic aorta. Wire was then removed. Double flush of the catheter was performed. Catheter was then used to select the innominate artery. Angiogram was performed. Using roadmap technique, the catheter was advanced over a roadrunner wire into right common carotid artery. Formal angiogram was performed. Exchange length Rosen wire was then passed through the diagnostic catheter to the distal common carotid artery and the diagnostic catheter was removed. The 5 French sheath was removed and exchanged for 8 French 55 centimeter BrightTip sheath. Sheath was flushed and attached to pressurized and heparinized saline bag for constant forward flow. Then an 8 [place], 85 cm Flowgate balloon tip catheter was prepared on the back table with inflation of the balloon with 50/50 concentration of dilute contrast. The balloon catheter was then advanced over the wire, positioned into the distal common carotid artery. Copious back flush was performed and the balloon catheter was attached to heparinized and pressurized saline bag for forward flow. Flowgate balloon catheter was then advanced over the road runner wire into the mid internal carotid artery. Angiogram was performed for road map guide. Microcatheter system  was then introduced through the balloon guide catheter, using a synchro soft 014 wire and a Trevo Provue18 catheter. Microcatheter system was advanced into the internal carotid artery, to the level of the occlusion. The micro wire was then carefully advanced through the occluded segment. Microcatheter was then push through the occluded segment and the wire was removed. Blood was then aspirated through the hub of the microcatheter, and a gentle contrast injection was performed confirming intraluminal position. A rotating hemostatic valve was then attached  to the back end of the microcatheter, and a pressurized and heparinized saline bag was attached to the catheter. 4 x 40 solitaire device was then selected. Back flush was achieved at the rotating hemostatic valve, and then the device was gently advanced through the microcatheter to the distal end. The retriever was then unsheathed by withdrawing the microcatheter under fluoroscopy. Once the retriever was completely unsheathed, control angiogram was performed from the balloon catheter. The balloon at the balloon tip catheter was then inflated under fluoroscopy for proximal flow arrest. Constant aspiration was then performed at the tip of the balloon guide catheter as the retriever was gently and slowly withdrawn with fluoroscopic observation. Once the retriever was entirely removed from the system, free aspiration was confirmed at the hub of the balloon guide catheter, with free blood return confirmed. The balloon was then deflated, rotating hemostatic valve was reattached, and a control angiogram was performed. Control angiogram was performed at the right common femoral artery puncture site. Two overlapping suture mediated devices were used for right common femoral artery closure. Patient tolerated the procedure well and remained hemodynamically stable throughout. No complications were encountered and no significant blood loss encountered. IMPRESSION: Status post  cerebral angiogram and mechanical thrombectomy of proximal occlusion of the superior and dominant MCA branch of the right hemisphere with restoration of TICI 3 flow. Suture mediated closure of right common femoral artery. Signed, Yvone Neu. Loreta Ave, DO [facility] Radiology PLAN: Extubated in the interventional suite CT post procedure ICU admission Right leg straight until 8 a.m. Electronically Signed   By: Gilmer Mor D.O.   On: 04/29/2017 04:07   Ct Cerebral Perfusion W Contrast  Result Date: 04/29/2017 CLINICAL DATA:  Code stroke. EXAM: CT HEAD WITHOUT CONTRAST CT CEREBRAL PERFUSION WITH CONTRAST TECHNIQUE: Contiguous axial images were obtained from the base of the skull through the vertex without intravenous contrast. Following the administration of contrast material, CT perfusion scanning of the brain was performed. COMPARISON:  None. FINDINGS: Brain: No mass lesion or acute hemorrhage. No focal hypoattenuation of the basal ganglia or cortex to indicate infarcted tissue. There is periventricular hypoattenuation compatible with chronic microvascular disease. No hydrocephalus or age advanced atrophy. Vascular: No hyperdense vessel. No advanced atherosclerotic calcification of the arteries at the skull base. Skull: Normal visualized skull base, calvarium and extracranial soft tissues. Sinuses/Orbits: No sinus fluid levels or advanced mucosal thickening. No mastoid effusion. Normal orbits. ASPECTS (Alberta Stroke Program Early CT Score) - Ganglionic level infarction (caudate, lentiform nuclei, internal capsule, insula, M1-M3 cortex): 7 - Supraganglionic infarction (M4-M6 cortex): 3 Total score (0-10 with 10 being normal): 10 CT Brain Perfusion Findings: CBF (<30%) Volume: 0mL Perfusion (Tmax>6.0s) volume: 63mL Mismatch Volume: 63mL Ischemia Location:Anterior right MCA distribution IMPRESSION: 1. No acute hemorrhage or mass lesion. 2. ASPECTS is 10. 3. Elevated  mean transit time within the anterior right MCA distribution without associated abnormal cerebral blood volume or cerebral blood flow likely indicates an area of acute ischemia without infarction. These results were called by telephone at the time of interpretation on 04/29/2017 at 12:55 am to Dr. Ritta Slot , who verbally acknowledged these results. Electronically Signed   By: Deatra Robinson M.D.   On: 04/29/2017 01:16   Dg Chest Port 1 View  Result Date: 04/30/2017 CLINICAL DATA:  Intracranial hemorrhage. EXAM: PORTABLE CHEST 1 VIEW COMPARISON:  Yesterday. FINDINGS: Normal sized heart. Clear lungs with decreased prominence of the pulmonary vasculature. Improved inspiration. Stable enlarged cardiac silhouette. Unremarkable bones.  IMPRESSION: Stable cardiomegaly with improved pulmonary vascular congestion, at least partly due to an improved inspiration. Electronically Signed   By: Beckie Salts M.D.   On: 04/30/2017 08:00   Dg Chest Port 1 View  Result Date: 04/29/2017 CLINICAL DATA:  Acute respiratory failure.  Stroke. EXAM: PORTABLE CHEST 1 VIEW COMPARISON:  Chest yesterday radiograph at [facility]. FINDINGS: Cardiomegaly accentuated by lower lung volumes. Crowding of bronchovascular structures with mild vascular congestion. Minimal retrocardiac atelectasis. No pleural fluid or pneumothorax. IMPRESSION: Cardiomegaly with mild vascular congestion. Electronically Signed   By: Rubye Oaks M.D.   On: 04/29/2017 05:32   Ir Percutaneous Art Thrombectomy/infusion Intracranial Inc Diag Angio  Result Date: 04/29/2017 INDICATION: 71 year old male with a history of acute right MCA stroke, left-sided symptoms, normal baseline, with stroke scale of 13, aspects 10, within the window for tPA. He presents for angiogram and mechanical thrombectomy as a treatment for emergent large vessel occlusion. EXAM: ULTRASOUND GUIDED ACCESS RIGHT COMMON FEMORAL ARTERY CEREBRAL ANGIOGRAM MECHANICAL THROMBECTOMY  RIGHT MCA BRANCHES CLOSURE OF RIGHT COMMON FEMORAL ARTERY ACCESS WITH SUTURE MEDIATED DEVICE COMPARISON:  CT IMAGING OF THE SAME DAY MEDICATIONS: 2.0 g Ancef. The antibiotic was administered within 1 hour of the procedure ANESTHESIA/SEDATION: General anesthesia with anesthesia team CONTRAST:  75 cc Isovue 300 FLUOROSCOPY TIME:  Fluoroscopy Time: 8 minutes 24 seconds (707 mGy). COMPLICATIONS: None TECHNIQUE: Informed written consent was obtained from the patient's family after a thorough discussion of the procedural risks, benefits and alternatives. Specific risks discussed include: Bleeding, infection, contrast reaction, kidney injury/failure, need for further procedure/surgery, arterial injury or dissection, embolization to new territory, intracranial hemorrhage (10-15% risk), neurologic deterioration, cardiopulmonary collapse, death. All questions were addressed. Maximal Sterile Barrier Technique was utilized including during the procedure including caps, mask, sterile gowns, sterile gloves, sterile drape, hand hygiene and skin antiseptic. A timeout was performed prior to the initiation of the procedure. FINDINGS: Initial: Right common carotid artery:  Normal course caliber and contour. Right external carotid artery: Patent with antegrade flow. Right internal carotid artery: Mild atherosclerotic changes with plaque at the carotid bifurcation. No significant stenosis at the bifurcation. Normal course caliber and contour of the cervical portion. Vertical and petrous segment patent with normal course caliber contour. Cavernous segment patent. Clinoid segment patent. Antegrade flow of the ophthalmic artery. Ophthalmic segment patent. Terminus patent. Right MCA: M1 segment is patent. There is an early arising temporal branch, with downward course. Inferior division patent on the initial image. There is proximal occlusion of the superior and dominant segment. Right ACA: A 1 segment patent. A 2 segment perfuses the right  territory. Final: Right MCA: Restoration of flow through the superior and dominant division, TICI 3 PROCEDURE: Patient was brought to the interventional suite, and identification of the patient was confirmed. Patient was placed supine position with gentle anesthesia initiated with the anesthesia team. The patient is then prepped and draped in the usual sterile fashion. Ultrasound survey of the right inguinal region was performed with images stored and sent to PACs. A micropuncture needle was used access the right common femoral artery under ultrasound. With excellent arterial blood flow returned, and an .018 micro wire was passed through the needle, observed enter the abdominal aorta under fluoroscopy. The needle was removed, and a micropuncture sheath was placed over the wire. The inner dilator and wire were removed, and an 035 Bentson wire was advanced under fluoroscopy into the abdominal aorta. The sheath was removed and a standard 5 [place] vascular sheath was placed.  The dilator was removed and the sheath was flushed. Ultrasound survey of the left inguinal region was then performed with images stored and sent to PACs. A micropuncture needle was used access the left common femoral artery under ultrasound. With excellent arterial blood flow returned, an .018 micro wire was passed through the needle, observed to enter the abdominal aorta under fluoroscopy. The needle was removed, and a micropuncture sheath was placed over the wire. The inner dilator and wire were removed, and an 035 Bentson wire was advanced under fluoroscopy into the abdominal aorta. The sheath was removed and a standard 4 [place] vascular sheath was placed for arterial monitoring. Dilator was removed and the sheath was flushed and attached to the transducer. A 34F JB-1 diagnostic catheter was advanced over the wire to the proximal descending thoracic aorta. Wire was then removed. Double flush of the catheter was performed. Catheter was then used to  select the innominate artery. Angiogram was performed. Using roadmap technique, the catheter was advanced over a roadrunner wire into right common carotid artery. Formal angiogram was performed. Exchange length Rosen wire was then passed through the diagnostic catheter to the distal common carotid artery and the diagnostic catheter was removed. The 5 French sheath was removed and exchanged for 8 French 55 centimeter BrightTip sheath. Sheath was flushed and attached to pressurized and heparinized saline bag for constant forward flow. Then an 8 [place], 85 cm Flowgate balloon tip catheter was prepared on the back table with inflation of the balloon with 50/50 concentration of dilute contrast. The balloon catheter was then advanced over the wire, positioned into the distal common carotid artery. Copious back flush was performed and the balloon catheter was attached to heparinized and pressurized saline bag for forward flow. Flowgate balloon catheter was then advanced over the road runner wire into the mid internal carotid artery. Angiogram was performed for road map guide. Microcatheter system was then introduced through the balloon guide catheter, using a synchro soft 014 wire and a Trevo Provue18 catheter. Microcatheter system was advanced into the internal carotid artery, to the level of the occlusion. The micro wire was then carefully advanced through the occluded segment. Microcatheter was then push through the occluded segment and the wire was removed. Blood was then aspirated through the hub of the microcatheter, and a gentle contrast injection was performed confirming intraluminal position. A rotating hemostatic valve was then attached to the back end of the microcatheter, and a pressurized and heparinized saline bag was attached to the catheter. 4 x 40 solitaire device was then selected. Back flush was achieved at the rotating hemostatic valve, and then the device was gently advanced through the microcatheter  to the distal end. The retriever was then unsheathed by withdrawing the microcatheter under fluoroscopy. Once the retriever was completely unsheathed, control angiogram was performed from the balloon catheter. The balloon at the balloon tip catheter was then inflated under fluoroscopy for proximal flow arrest. Constant aspiration was then performed at the tip of the balloon guide catheter as the retriever was gently and slowly withdrawn with fluoroscopic observation. Once the retriever was entirely removed from the system, free aspiration was confirmed at the hub of the balloon guide catheter, with free blood return confirmed. The balloon was then deflated, rotating hemostatic valve was reattached, and a control angiogram was performed. Control angiogram was performed at the right common femoral artery puncture site. Two overlapping suture mediated devices were used for right common femoral artery closure. Patient tolerated the procedure well and  remained hemodynamically stable throughout. No complications were encountered and no significant blood loss encountered. IMPRESSION: Status post cerebral angiogram and mechanical thrombectomy of proximal occlusion of the superior and dominant MCA branch of the right hemisphere with restoration of TICI 3 flow. Suture mediated closure of right common femoral artery. Signed, Yvone Neu. Loreta Ave, DO [facility] Radiology PLAN: Extubated in the interventional suite CT post procedure ICU admission Right leg straight until 8 a.m. Electronically Signed   By: Gilmer Mor D.O.   On: 04/29/2017 04:07   Ct Head Code Stroke Wo Contrast  Result Date: 04/29/2017 CLINICAL DATA:  Code stroke. EXAM: CT HEAD WITHOUT CONTRAST CT CEREBRAL PERFUSION WITH CONTRAST TECHNIQUE: Contiguous axial images were obtained from the base of the skull through the vertex without intravenous contrast. Following the administration of contrast material, CT  perfusion scanning of the brain was performed. COMPARISON:  None. FINDINGS: Brain: No mass lesion or acute hemorrhage. No focal hypoattenuation of the basal ganglia or cortex to indicate infarcted tissue. There is periventricular hypoattenuation compatible with chronic microvascular disease. No hydrocephalus or age advanced atrophy. Vascular: No hyperdense vessel. No advanced atherosclerotic calcification of the arteries at the skull base. Skull: Normal visualized skull base, calvarium and extracranial soft tissues. Sinuses/Orbits: No sinus fluid levels or advanced mucosal thickening. No mastoid effusion. Normal orbits. ASPECTS (Alberta Stroke Program Early CT Score) - Ganglionic level infarction (caudate, lentiform nuclei, internal capsule, insula, M1-M3 cortex): 7 - Supraganglionic infarction (M4-M6 cortex): 3 Total score (0-10 with 10 being normal): 10 CT Brain Perfusion Findings: CBF (<30%) Volume: 0mL Perfusion (Tmax>6.0s) volume: 63mL Mismatch Volume: 63mL Ischemia Location:Anterior right MCA distribution IMPRESSION: 1. No acute hemorrhage or mass lesion. 2. ASPECTS is 10. 3. Elevated mean transit time within the anterior right MCA distribution without associated abnormal cerebral blood volume or cerebral blood flow likely indicates an area of acute ischemia without infarction. These results were called by telephone at the time of interpretation on 04/29/2017 at 12:55 am to Dr. Ritta Slot , who verbally acknowledged these results. Electronically Signed   By: Deatra Robinson M.D.   On: 04/29/2017 01:16    Labs:  CBC: Recent Labs    04/29/17 0112 04/30/17 0428 05/01/17 0950  WBC 14.3* 21.6* 13.2*  HGB 13.8 11.1* 11.3*  HCT 39.6 32.3* 34.5*  PLT 205 184 186    COAGS: No results for input(s): INR, APTT in the last 8760 hours.  BMP: Recent Labs    04/29/17 0112 04/30/17 0428 05/01/17 0950  NA 133* 136 135  K 4.4 4.0 4.2  CL 103 106 105  CO2 23 24 25   GLUCOSE 273* 165* 182*  BUN  16 19 18   CALCIUM 8.8* 8.8* 8.5*  CREATININE 0.89 0.81 0.82  GFRNONAA >60 >60 >60  GFRAA >60 >60 >60    LIVER FUNCTION TESTS: Recent Labs    04/29/17 0112  BILITOT 0.9  AST 22  ALT 22  ALKPHOS 92  PROT 7.2  ALBUMIN 3.7    Assessment and Plan:  Acute CVA  Status post cerebral angiogram and mechanical thrombectomy of proximal occlusion of the superior and dominant MCA branch of the right hemisphere with restoration of TICI 3 flow by Dr. Loreta Ave.  Continue current care. Can begin OT/PT.  Electronically Signed: Gwynneth Macleod, PA-C 05/01/2017, 11:49 AM   I spent a total of 15 Minutes at the the patient's bedside AND on the patient's hospital floor or unit, greater than 50% of which was counseling/coordinating  care for f/u after cerebral angio.

## 2017-05-01 NOTE — Plan of Care (Signed)
Pt progressing in knowledge of infection prevention and health planning.

## 2017-05-01 NOTE — Progress Notes (Signed)
OT Cancellation    05/01/17 1400  OT Visit Information  Last OT Received On 05/01/17  Reason Eval/Treat Not Completed Medical issues which prohibited therapy (Per RN report, pt on bed rest (no formal orders) due to Afib and syncopal episode earlier today. Will return as schedule allows and when pt is medically ready. Thank you)   Curlene Dolphinharis Jadee Golebiewski MSOT, OTR/L Acute Rehab Pager: [phone] Office: [phone]

## 2017-05-01 NOTE — Progress Notes (Addendum)
New onset atrial fibrillation with RVR, no patient distress HR 120s - 130s. I have discussed with cardiology who will consult on the patient, and they request transfer to stepdown for diltiazem  drip. I have placed this order. If Dilt is continued, consider change from simvastatin due to interaction.   Nathan SlotMcNeill Laray Rivkin, MD [facility] [phone]  If 7pm- 7am, please page neurology on call as listed in AMION.

## 2017-05-02 ENCOUNTER — Encounter (HOSPITAL_COMMUNITY): Payer: Self-pay | Admitting: Radiology

## 2017-05-02 DIAGNOSIS — E1110 Type 2 diabetes mellitus with ketoacidosis without coma: Secondary | ICD-10-CM

## 2017-05-02 DIAGNOSIS — I509 Heart failure, unspecified: Secondary | ICD-10-CM

## 2017-05-02 DIAGNOSIS — I4891 Unspecified atrial fibrillation: Secondary | ICD-10-CM

## 2017-05-02 DIAGNOSIS — I5022 Chronic systolic (congestive) heart failure: Secondary | ICD-10-CM

## 2017-05-02 LAB — BASIC METABOLIC PANEL
Anion gap: 7 (ref 5–15)
BUN: 14 mg/dL (ref 6–20)
CALCIUM: 8.4 mg/dL — AB (ref 8.9–10.3)
CHLORIDE: 103 mmol/L (ref 101–111)
CO2: 24 mmol/L (ref 22–32)
Creatinine, Ser: 0.79 mg/dL (ref 0.61–1.24)
GFR calc Af Amer: >60 mL/min (ref >60)
GFR calc non Af Amer: >60 mL/min (ref >60)
Glucose, Bld: 159 mg/dL — ABNORMAL HIGH (ref 65–99)
Potassium: 3.5 mmol/L (ref 3.5–5.1)
SODIUM: 134 mmol/L — AB (ref 135–145)

## 2017-05-02 LAB — CBC
HCT: 32.1 % — ABNORMAL LOW (ref 39.0–52.0)
HEMOGLOBIN: 10.8 g/dL — AB (ref 13.0–17.0)
MCH: 30.9 pg (ref 26.0–34.0)
MCHC: 33.6 g/dL (ref 30.0–36.0)
MCV: 91.7 fL (ref 78.0–100.0)
Platelets: 181 10*3/uL (ref 150–400)
RBC: 3.5 MIL/uL — ABNORMAL LOW (ref 4.22–5.81)
RDW: 12.5 % (ref 11.5–15.5)
WBC: 10.8 10*3/uL — ABNORMAL HIGH (ref 4.0–10.5)

## 2017-05-02 LAB — GLUCOSE, CAPILLARY
GLUCOSE-CAPILLARY: 158 mg/dL — AB (ref 65–99)
GLUCOSE-CAPILLARY: 160 mg/dL — AB (ref 65–99)
GLUCOSE-CAPILLARY: 145 mg/dL — AB (ref 65–99)
Glucose-Capillary: 126 mg/dL — ABNORMAL HIGH (ref 65–99)

## 2017-05-02 MED ORDER — CARVEDILOL 25 MG PO TABS
25.0000 mg | ORAL_TABLET | Freq: Two times a day (BID) | ORAL | Status: DC
  Administered 2017-05-02 – 2017-05-03 (×2): 25 mg via ORAL
  Filled 2017-05-02 (×2): qty 1

## 2017-05-02 NOTE — Evaluation (Signed)
Occupational Therapy Evaluation Patient Details Name: Nathan [name] MRN: [id number] DOB: 07-25-1945 Today's Date: 05/02/2017    History of Present Illness Pt admitted on 04/29/17 with M2 occlusion with TPA given   Clinical Impression   Pt is typically independent. His wife reports he has memory deficits and "doesn't listen" at baseline. Pt presents with slight weakness on L compared to R. Min assist needed for ADL and min guard to min assist for ADL transfers was required this visit. Pt demonstrates poor attention, difficult to assess vision accurately, but appears to have a R gaze preference, although he can look to the L with cues. Educated wife at length in home safety related to pt's deficits and need for 24 hour supervision. Wife verbalizing understanding.    Follow Up Recommendations  Home health OT;Supervision/Assistance - 24 hour    Equipment Recommendations       Recommendations for Other Services       Precautions / Restrictions Precautions Precautions: Fall Restrictions Weight Bearing Restrictions: No      Mobility Bed Mobility Overal bed mobility: Needs Assistance Bed Mobility: Supine to Sit     Supine to sit: Supervision     General bed mobility comments: for safety, lines  Transfers Overall transfer level: Needs assistance Equipment used: 1 person hand held assist Transfers: Sit to/from Stand Sit to Stand: Min guard         General transfer comment: from bed and chair    Balance Overall balance assessment: Needs assistance   Sitting balance-Leahy Scale: Good Sitting balance - Comments: no LOB with LB dressing      Standing balance-Leahy Scale: Fair Standing balance comment: min guard for safety                           ADL either performed or assessed with clinical judgement   ADL Overall ADL's : Needs assistance/impaired Eating/Feeding: Independent;Bed level   Grooming: Wash/dry hands;Standing;Minimal  assistance Grooming Details (indicate cue type and reason): cues to locate soap, paper towels, to turn off water Upper Body Bathing: Minimal assistance;Sitting   Lower Body Bathing: Minimal assistance;Sit to/from stand   Upper Body Dressing : Minimal assistance;Sitting   Lower Body Dressing: Sit to/from stand;Minimal assistance Lower Body Dressing Details (indicate cue type and reason): doesn't typically wear socks, but able to don Toilet Transfer: Minimal assistance;Ambulation   Toileting- Clothing Manipulation and Hygiene: Minimal assistance       Functional mobility during ADLs: Minimal assistance       Vision Baseline Vision/History: Wears glasses Wears Glasses: Reading only Additional Comments: Fields appear intact, pt with difficulty complying to requirements of formal testing due to poor attention, +R gaze preference, but able to look toward L      Perception     Praxis      Pertinent Vitals/Pain Pain Assessment: No/denies pain     Hand Dominance Left   Extremity/Trunk Assessment Upper Extremity Assessment Upper Extremity Assessment: RUE deficits/detail RUE Deficits / Details: 4+/5   Lower Extremity Assessment Lower Extremity Assessment: Defer to PT evaluation       Communication Communication Communication: HOH   Cognition Arousal/Alertness: Awake/alert Behavior During Therapy: WFL for tasks assessed/performed(labile at times) Overall Cognitive Status: Impaired/Different from baseline Area of Impairment: Attention;Memory;Following commands;Safety/judgement;Problem solving                   Current Attention Level: Focused Memory: Decreased short-term memory Following Commands: Follows one step commands  with increased time Safety/Judgement: Decreased awareness of deficits;Decreased awareness of safety   Problem Solving: Slow processing;Decreased initiation;Difficulty sequencing;Requires verbal cues General Comments: pt asking if he was safe to  drive, then seemed surprised when told no   General Comments       Exercises     Shoulder Instructions      Home Living Family/patient expects to be discharged to:: Private residence Living Arrangements: Spouse/significant other Available Help at Discharge: Family;Available 24 hours/day(son can also stay with pt) Type of Home: House Home Access: Level entry     Home Layout: One level     Bathroom Shower/Tub: Chief Strategy OfficerTub/shower unit   Bathroom Toilet: Standard     Home Equipment: None      Lives With: Spouse    Prior Functioning/Environment Level of Independence: Independent        Comments: drives, likes to [employer] [place], wife states pt was having memory deficits prior to admission        OT Problem List: Impaired balance (sitting and/or standing);Decreased safety awareness;Decreased cognition;Decreased strength      OT Treatment/Interventions: Self-care/ADL training;DME and/or AE instruction;Patient/family education;Balance training;Therapeutic activities    OT Goals(Current goals can be found in the care plan section) Acute Rehab OT Goals Patient Stated Goal: to go home OT Goal Formulation: With patient Time For Goal Achievement: 05/16/17 Potential to Achieve Goals: Good  OT Frequency: Min 3X/week   Barriers to D/C:            Co-evaluation              AM-PAC PT "6 Clicks" Daily Activity     Outcome Measure Help from another person eating meals?: None Help from another person taking care of personal grooming?: A Little Help from another person toileting, which includes using toliet, bedpan, or urinal?: A Little Help from another person bathing (including washing, rinsing, drying)?: A Little Help from another person to put on and taking off regular upper body clothing?: A Little Help from another person to put on and taking off regular lower body clothing?: A Little 6 Click Score: 19   End of Session Equipment Utilized During Treatment: Gait belt  Activity  Tolerance: Patient tolerated treatment well Patient left: in chair;with call bell/phone within reach;with chair alarm set;with family/visitor present  OT Visit Diagnosis: Other symptoms and signs involving cognitive function;Other abnormalities of gait and mobility (R26.89)                Time: [id number] OT Time Calculation (min): 45 min Charges:  OT General Charges $OT Visit: 1 Visit OT Evaluation $OT Eval Moderate Complexity: 1 Mod OT Treatments $Self Care/Home Management : 23-37 mins G-Codes:     05/02/2017 Martie RoundJulie Mayara Paulson, OTR/L Pager: [phone] [place] PlanasMayberry, [place] BailiffJulie Lynn 05/02/2017, 11:30 AM

## 2017-05-02 NOTE — Progress Notes (Signed)
Patient ID: Nathan [name], male   DOB: 09-Jun-1946, 71 y.o.   MRN: [id number]    Referring Physician(s): Dr. Marvel Plan  Supervising Physician: Gilmer Mor  Patient Status: [facility] - In-pt  Chief Complaint: CVA  Subjective: Patient feels well, conts to improves.  Tolerating breakfast currently.  Allergies: Patient has no allergy information on record.  Medications: Prior to Admission medications   Medication Sig Start Date End Date Taking? Authorizing Provider  aspirin EC 81 MG tablet Take 81 mg by mouth daily.   Yes [provider]  carvedilol (COREG) 25 MG tablet Take 25 mg by mouth 2 (two) times daily. 02/26/17  Yes [provider]  cyclobenzaprine (FLEXERIL) 10 MG tablet Take 10 mg by mouth 2 (two) times daily as needed. 04/11/17  Yes [provider]  glipiZIDE (GLUCOTROL XL) 10 MG 24 hr tablet Take 10 mg by mouth daily. 03/30/17  Yes [provider]  lisinopril (PRINIVIL,ZESTRIL) 5 MG tablet Take 5 mg by mouth daily. 10/12/16  Yes [provider]  meclizine (ANTIVERT) 25 MG tablet Take 25 mg by mouth daily as needed. 04/07/17  Yes [provider]  meloxicam (MOBIC) 15 MG tablet Take 15 mg by mouth daily as needed. 04/11/17  Yes [provider]  mupirocin ointment (BACTROBAN) 2 % Apply 1 application topically daily as needed. 04/11/17  Yes [provider]  naproxen sodium (ALEVE) 220 MG tablet Take 220 mg by mouth daily as needed (pain).   Yes [provider]  repaglinide (PRANDIN) 0.5 MG tablet Take 0.5 tablets by mouth 3 (three) times daily with meals. 01/27/17  Yes [provider]  simvastatin (ZOCOR) 40 MG tablet Take 40 mg by mouth at bedtime.  01/27/17  Yes [provider]  tamsulosin (FLOMAX) 0.4 MG CAPS capsule Take 0.4 mg by mouth daily. 03/30/17  Yes [provider]    Vital Signs: BP 127/75 (BP Location: Left Arm)   Pulse 78   Temp 98.1 F (36.7 C) (Oral)    Resp 18   Ht 5\' 11"  (1.803 m)   Wt 250 lb 1.6 oz (113.4 kg)   SpO2 97%   BMI 34.88 kg/m   Physical Exam: Neuro: still with slight left-sided facial droop.  Slightly weaker in LUE grip than right. Skin: R CFA stick site with ecchymosis, but no hematoma, nontender  Imaging: Ct Head Wo Contrast  Result Date: 04/29/2017 CLINICAL DATA:  71 y/o  M; stroke follow-up. EXAM: CT HEAD WITHOUT CONTRAST TECHNIQUE: Contiguous axial images were obtained from the base of the skull through the vertex without intravenous contrast. COMPARISON:  04/29/2017 CT head and CT perfusion head. FINDINGS: Brain: Area of hypoattenuation within the right superior insula and frontal operculum measuring 2.1 x 1.9 x 2.2 cm (volume = 4.6 cm^3) compatible infarction within the region of perfusion anomaly on prior CT. No new acute intracranial hemorrhage, infarction, or significant mass effect identified. Background of mild chronic microvascular ischemic changes and parenchymal volume loss. Vascular: Mild calcific atherosclerosis of carotid siphons. No hyperdense vessel identified. Skull: Normal. Negative for fracture or focal lesion. Sinuses/Orbits: No acute finding. Other: None. IMPRESSION: 1. Evolving infarction and right superior insula and frontal operculum measuring 4.6 cc within the region of perfusion anomaly on prior CT perfusion. 2. No new large territory acute infarction, hemorrhage, or mass effect. 3. Mild chronic microvascular ischemic changes and parenchymal volume loss of the brain. Electronically Signed   By: Mitzi Hansen M.D.   On: 04/29/2017 19:05  Ct Head Wo Contrast  Result Date: 04/29/2017 CLINICAL DATA:  Right MCA stroke with percutaneous intervention. EXAM: CT HEAD WITHOUT CONTRAST TECHNIQUE: Contiguous axial images were obtained from the base of the skull through the vertex without intravenous contrast. COMPARISON:  Cerebral angiogram 04/29/2017 Head CT 04/29/2017 FINDINGS: Brain: There is no  intracranial hemorrhage. Gray-white differentiation is preserved. Basal ganglia and insular ribbons are preserved. Vascular: No hyperdense vessel or unexpected calcification.There is mild intravascular enhancement due to presence of circulating contrast. Skull: Normal visualized skull base, calvarium and extracranial soft tissues. Sinuses/Orbits: No sinus fluid levels or advanced mucosal thickening. No mastoid effusion. Normal orbits. IMPRESSION: No intracranial hemorrhage or cytotoxic edema following vascular intervention. Electronically Signed   By: Deatra Robinson M.D.   On: 04/29/2017 04:05   [name] Head Wo Contrast  Result Date: 04/30/2017 CLINICAL DATA:  Follow-up acute RIGHT MCA stroke and mechanical thrombectomy of MCA occlusion. EXAM: MRI HEAD WITHOUT CONTRAST MRA HEAD WITHOUT CONTRAST TECHNIQUE: Multiplanar, multiecho pulse sequences of the brain and surrounding structures were obtained without intravenous contrast. Angiographic images of the head were obtained using MRA technique without contrast. COMPARISON:  CT HEAD April 29, 2017 and CT angiogram of the head April 28, 2017 FINDINGS: MRI HEAD FINDINGS- mildly motion degraded examination. BRAIN: Patchy RIGHT frontal reduced diffusion, 1.5 x 2 cm confluent component RIGHT insula with low ADC values and susceptibility artifact within the insular component. LEFT frontal lobe chronic microhemorrhage. Patchy supratentorial white matter FLAIR T2 hyperintensities exclusive of aforementioned abnormality. Ventricles and sulci are normal for patient's age. No midline shift, mass effect or masses. No abnormal extra-axial fluid collections. VASCULAR: See below. SKULL AND UPPER CERVICAL SPINE: No abnormal sellar expansion. No suspicious calvarial bone marrow signal. Craniocervical junction maintained. SINUSES/ORBITS: The mastoid air-cells and included paranasal sinuses are well-aerated. The included ocular globes and orbital contents are non-suspicious. OTHER:  None. MRA HEAD FINDINGS- moderately motion degraded examination. ANTERIOR CIRCULATION: Normal flow related enhancement of the included cervical, petrous, cavernous and supraclinoid internal carotid arteries. Patent anterior communicating artery. Patent anterior and middle cerebral arteries, including distal segments. Focal stenosis versus motion artifact RIGHT M2 origin seen on maximum intensity projected reformations. No large vessel occlusion, flow limiting stenosis. POSTERIOR CIRCULATION: LEFT vertebral artery is dominant. Basilar artery is patent, with normal flow related enhancement of the main branch vessels. Patent posterior cerebral arteries. Bilateral posterior communicating artery's present. No large vessel occlusion, flow limiting stenosis. ANATOMIC VARIANTS: None. Source images and MIP images were reviewed. IMPRESSION: MRI HEAD: 1. Mildly motion degraded examination. 2. Acute patchy RIGHT frontal lobe infarct, with petechial hemorrhage within insular component. 3. Mild to moderate chronic small vessel ischemic disease. MRA HEAD: 1. Moderately motion degraded examination. 2. No emergent large vessel occlusion or flow-limiting stenosis. 3. Focal stenosis versus motion artifact RIGHT M2 origin. Electronically Signed   By: Awilda Metro M.D.   On: 04/30/2017 04:32   Mr Brain Wo Contrast  Result Date: 04/30/2017 CLINICAL DATA:  Follow-up acute RIGHT MCA stroke and mechanical thrombectomy of MCA occlusion. EXAM: MRI HEAD WITHOUT CONTRAST MRA HEAD WITHOUT CONTRAST TECHNIQUE: Multiplanar, multiecho pulse sequences of the brain and surrounding structures were obtained without intravenous contrast. Angiographic images of the head were obtained using MRA technique without contrast. COMPARISON:  CT HEAD April 29, 2017 and CT angiogram of the head April 28, 2017 FINDINGS: MRI HEAD FINDINGS- mildly motion degraded examination. BRAIN: Patchy RIGHT frontal reduced diffusion, 1.5 x 2 cm confluent component  RIGHT insula with low ADC values  and susceptibility artifact within the insular component. LEFT frontal lobe chronic microhemorrhage. Patchy supratentorial white matter FLAIR T2 hyperintensities exclusive of aforementioned abnormality. Ventricles and sulci are normal for patient's age. No midline shift, mass effect or masses. No abnormal extra-axial fluid collections. VASCULAR: See below. SKULL AND UPPER CERVICAL SPINE: No abnormal sellar expansion. No suspicious calvarial bone marrow signal. Craniocervical junction maintained. SINUSES/ORBITS: The mastoid air-cells and included paranasal sinuses are well-aerated. The included ocular globes and orbital contents are non-suspicious. OTHER: None. MRA HEAD FINDINGS- moderately motion degraded examination. ANTERIOR CIRCULATION: Normal flow related enhancement of the included cervical, petrous, cavernous and supraclinoid internal carotid arteries. Patent anterior communicating artery. Patent anterior and middle cerebral arteries, including distal segments. Focal stenosis versus motion artifact RIGHT M2 origin seen on maximum intensity projected reformations. No large vessel occlusion, flow limiting stenosis. POSTERIOR CIRCULATION: LEFT vertebral artery is dominant. Basilar artery is patent, with normal flow related enhancement of the main branch vessels. Patent posterior cerebral arteries. Bilateral posterior communicating artery's present. No large vessel occlusion, flow limiting stenosis. ANATOMIC VARIANTS: None. Source images and MIP images were reviewed. IMPRESSION: MRI HEAD: 1. Mildly motion degraded examination. 2. Acute patchy RIGHT frontal lobe infarct, with petechial hemorrhage within insular component. 3. Mild to moderate chronic small vessel ischemic disease. MRA HEAD: 1. Moderately motion degraded examination. 2. No emergent large vessel occlusion or flow-limiting stenosis. 3. Focal stenosis versus motion artifact RIGHT M2 origin. Electronically Signed   By:  Awilda Metro M.D.   On: 04/30/2017 04:32   Ir US Guide Vasc Access Left  Result Date: 04/29/2017 INDICATION: 71 year old male with a history of acute right MCA stroke, left-sided symptoms, normal baseline, with stroke scale of 13, aspects 10, within the window for tPA. He presents for angiogram and mechanical thrombectomy as a treatment for emergent large vessel occlusion. EXAM: ULTRASOUND GUIDED ACCESS RIGHT COMMON FEMORAL ARTERY CEREBRAL ANGIOGRAM MECHANICAL THROMBECTOMY RIGHT MCA BRANCHES CLOSURE OF RIGHT COMMON FEMORAL ARTERY ACCESS WITH SUTURE MEDIATED DEVICE COMPARISON:  CT IMAGING OF THE SAME DAY MEDICATIONS: 2.0 g Ancef. The antibiotic was administered within 1 hour of the procedure ANESTHESIA/SEDATION: General anesthesia with anesthesia team CONTRAST:  75 cc Isovue 300 FLUOROSCOPY TIME:  Fluoroscopy Time: 8 minutes 24 seconds (707 mGy). COMPLICATIONS: None TECHNIQUE: Informed written consent was obtained from the patient's family after a thorough discussion of the procedural risks, benefits and alternatives. Specific risks discussed include: Bleeding, infection, contrast reaction, kidney injury/failure, need for further procedure/surgery, arterial injury or dissection, embolization to new territory, intracranial hemorrhage (10-15% risk), neurologic deterioration, cardiopulmonary collapse, death. All questions were addressed. Maximal Sterile Barrier Technique was utilized including during the procedure including caps, mask, sterile gowns, sterile gloves, sterile drape, hand hygiene and skin antiseptic. A timeout was performed prior to the initiation of the procedure. FINDINGS: Initial: Right common carotid artery:  Normal course caliber and contour. Right external carotid artery: Patent with antegrade flow. Right internal carotid artery: Mild atherosclerotic changes with plaque at the carotid bifurcation. No significant stenosis at the bifurcation. Normal course caliber and contour of the cervical  portion. Vertical and petrous segment patent with normal course caliber contour. Cavernous segment patent. Clinoid segment patent. Antegrade flow of the ophthalmic artery. Ophthalmic segment patent. Terminus patent. Right MCA: M1 segment is patent. There is an early arising temporal branch, with downward course. Inferior division patent on the initial image. There is proximal occlusion of the superior and dominant segment. Right ACA: A 1 segment patent. A 2 segment perfuses the right territory.  Final: Right MCA: Restoration of flow through the superior and dominant division, TICI 3 PROCEDURE: Patient was brought to the interventional suite, and identification of the patient was confirmed. Patient was placed supine position with gentle anesthesia initiated with the anesthesia team. The patient is then prepped and draped in the usual sterile fashion. Ultrasound survey of the right inguinal region was performed with images stored and sent to PACs. A micropuncture needle was used access the right common femoral artery under ultrasound. With excellent arterial blood flow returned, and an .018 micro wire was passed through the needle, observed enter the abdominal aorta under fluoroscopy. The needle was removed, and a micropuncture sheath was placed over the wire. The inner dilator and wire were removed, and an 035 Bentson wire was advanced under fluoroscopy into the abdominal aorta. The sheath was removed and a standard 5 [place] vascular sheath was placed. The dilator was removed and the sheath was flushed. Ultrasound survey of the left inguinal region was then performed with images stored and sent to PACs. A micropuncture needle was used access the left common femoral artery under ultrasound. With excellent arterial blood flow returned, an .018 micro wire was passed through the needle, observed to enter the abdominal aorta under fluoroscopy. The needle was removed, and a micropuncture sheath was placed over the wire. The  inner dilator and wire were removed, and an 035 Bentson wire was advanced under fluoroscopy into the abdominal aorta. The sheath was removed and a standard 4 [place] vascular sheath was placed for arterial monitoring. Dilator was removed and the sheath was flushed and attached to the transducer. A 96F JB-1 diagnostic catheter was advanced over the wire to the proximal descending thoracic aorta. Wire was then removed. Double flush of the catheter was performed. Catheter was then used to select the innominate artery. Angiogram was performed. Using roadmap technique, the catheter was advanced over a roadrunner wire into right common carotid artery. Formal angiogram was performed. Exchange length Rosen wire was then passed through the diagnostic catheter to the distal common carotid artery and the diagnostic catheter was removed. The 5 French sheath was removed and exchanged for 8 French 55 centimeter BrightTip sheath. Sheath was flushed and attached to pressurized and heparinized saline bag for constant forward flow. Then an 8 [place], 85 cm Flowgate balloon tip catheter was prepared on the back table with inflation of the balloon with 50/50 concentration of dilute contrast. The balloon catheter was then advanced over the wire, positioned into the distal common carotid artery. Copious back flush was performed and the balloon catheter was attached to heparinized and pressurized saline bag for forward flow. Flowgate balloon catheter was then advanced over the road runner wire into the mid internal carotid artery. Angiogram was performed for road map guide. Microcatheter system was then introduced through the balloon guide catheter, using a synchro soft 014 wire and a Trevo Provue18 catheter. Microcatheter system was advanced into the internal carotid artery, to the level of the occlusion. The micro wire was then carefully advanced through the occluded segment. Microcatheter was then push through the occluded segment and the  wire was removed. Blood was then aspirated through the hub of the microcatheter, and a gentle contrast injection was performed confirming intraluminal position. A rotating hemostatic valve was then attached to the back end of the microcatheter, and a pressurized and heparinized saline bag was attached to the catheter. 4 x 40 solitaire device was then selected. Back flush was achieved at the rotating hemostatic valve,  and then the device was gently advanced through the microcatheter to the distal end. The retriever was then unsheathed by withdrawing the microcatheter under fluoroscopy. Once the retriever was completely unsheathed, control angiogram was performed from the balloon catheter. The balloon at the balloon tip catheter was then inflated under fluoroscopy for proximal flow arrest. Constant aspiration was then performed at the tip of the balloon guide catheter as the retriever was gently and slowly withdrawn with fluoroscopic observation. Once the retriever was entirely removed from the system, free aspiration was confirmed at the hub of the balloon guide catheter, with free blood return confirmed. The balloon was then deflated, rotating hemostatic valve was reattached, and a control angiogram was performed. Control angiogram was performed at the right common femoral artery puncture site. Two overlapping suture mediated devices were used for right common femoral artery closure. Patient tolerated the procedure well and remained hemodynamically stable throughout. No complications were encountered and no significant blood loss encountered. IMPRESSION: Status post cerebral angiogram and mechanical thrombectomy of proximal occlusion of the superior and dominant MCA branch of the right hemisphere with restoration of TICI 3 flow. Suture mediated closure of right common femoral artery. Signed, Yvone Neu. Loreta Ave, DO [facility] Rad[facility] PLAN: Extubated in the  interventional suite CT post procedure ICU admission Right leg straight until 8 a.m. Electronically Signed   By: Gilmer Mor D.O.   On: 04/29/2017 04:07   Ir US Guide Vasc Access Right  Result Date: 04/29/2017 INDICATION: 71 year old male with a history of acute right MCA stroke, left-sided symptoms, normal baseline, with stroke scale of 13, aspects 10, within the window for tPA. He presents for angiogram and mechanical thrombectomy as a treatment for emergent large vessel occlusion. EXAM: ULTRASOUND GUIDED ACCESS RIGHT COMMON FEMORAL ARTERY CEREBRAL ANGIOGRAM MECHANICAL THROMBECTOMY RIGHT MCA BRANCHES CLOSURE OF RIGHT COMMON FEMORAL ARTERY ACCESS WITH SUTURE MEDIATED DEVICE COMPARISON:  CT IMAGING OF THE SAME DAY MEDICATIONS: 2.0 g Ancef. The antibiotic was administered within 1 hour of the procedure ANESTHESIA/SEDATION: General anesthesia with anesthesia team CONTRAST:  75 cc Isovue 300 FLUOROSCOPY TIME:  Fluoroscopy Time: 8 minutes 24 seconds (707 mGy). COMPLICATIONS: None TECHNIQUE: Informed written consent was obtained from the patient's family after a thorough discussion of the procedural risks, benefits and alternatives. Specific risks discussed include: Bleeding, infection, contrast reaction, kidney injury/failure, need for further procedure/surgery, arterial injury or dissection, embolization to new territory, intracranial hemorrhage (10-15% risk), neurologic deterioration, cardiopulmonary collapse, death. All questions were addressed. Maximal Sterile Barrier Technique was utilized including during the procedure including caps, mask, sterile gowns, sterile gloves, sterile drape, hand hygiene and skin antiseptic. A timeout was performed prior to the initiation of the procedure. FINDINGS: Initial: Right common carotid artery:  Normal course caliber and contour. Right external carotid artery: Patent with antegrade flow. Right internal carotid artery: Mild atherosclerotic changes with plaque at the  carotid bifurcation. No significant stenosis at the bifurcation. Normal course caliber and contour of the cervical portion. Vertical and petrous segment patent with normal course caliber contour. Cavernous segment patent. Clinoid segment patent. Antegrade flow of the ophthalmic artery. Ophthalmic segment patent. Terminus patent. Right MCA: M1 segment is patent. There is an early arising temporal branch, with downward course. Inferior division patent on the initial image. There is proximal occlusion of the superior and dominant segment. Right ACA: A 1 segment patent. A 2 segment perfuses the right territory. Final: Right MCA: Restoration of flow through the superior and dominant division, TICI 3  PROCEDURE: Patient was brought to the interventional suite, and identification of the patient was confirmed. Patient was placed supine position with gentle anesthesia initiated with the anesthesia team. The patient is then prepped and draped in the usual sterile fashion. Ultrasound survey of the right inguinal region was performed with images stored and sent to PACs. A micropuncture needle was used access the right common femoral artery under ultrasound. With excellent arterial blood flow returned, and an .018 micro wire was passed through the needle, observed enter the abdominal aorta under fluoroscopy. The needle was removed, and a micropuncture sheath was placed over the wire. The inner dilator and wire were removed, and an 035 Bentson wire was advanced under fluoroscopy into the abdominal aorta. The sheath was removed and a standard 5 [place] vascular sheath was placed. The dilator was removed and the sheath was flushed. Ultrasound survey of the left inguinal region was then performed with images stored and sent to PACs. A micropuncture needle was used access the left common femoral artery under ultrasound. With excellent arterial blood flow returned, an .018 micro wire was passed through the needle, observed to enter the  abdominal aorta under fluoroscopy. The needle was removed, and a micropuncture sheath was placed over the wire. The inner dilator and wire were removed, and an 035 Bentson wire was advanced under fluoroscopy into the abdominal aorta. The sheath was removed and a standard 4 [place] vascular sheath was placed for arterial monitoring. Dilator was removed and the sheath was flushed and attached to the transducer. A 89F JB-1 diagnostic catheter was advanced over the wire to the proximal descending thoracic aorta. Wire was then removed. Double flush of the catheter was performed. Catheter was then used to select the innominate artery. Angiogram was performed. Using roadmap technique, the catheter was advanced over a roadrunner wire into right common carotid artery. Formal angiogram was performed. Exchange length Rosen wire was then passed through the diagnostic catheter to the distal common carotid artery and the diagnostic catheter was removed. The 5 French sheath was removed and exchanged for 8 French 55 centimeter BrightTip sheath. Sheath was flushed and attached to pressurized and heparinized saline bag for constant forward flow. Then an 8 [place], 85 cm Flowgate balloon tip catheter was prepared on the back table with inflation of the balloon with 50/50 concentration of dilute contrast. The balloon catheter was then advanced over the wire, positioned into the distal common carotid artery. Copious back flush was performed and the balloon catheter was attached to heparinized and pressurized saline bag for forward flow. Flowgate balloon catheter was then advanced over the road runner wire into the mid internal carotid artery. Angiogram was performed for road map guide. Microcatheter system was then introduced through the balloon guide catheter, using a synchro soft 014 wire and a Trevo Provue18 catheter. Microcatheter system was advanced into the internal carotid artery, to the level of the occlusion. The micro wire was  then carefully advanced through the occluded segment. Microcatheter was then push through the occluded segment and the wire was removed. Blood was then aspirated through the hub of the microcatheter, and a gentle contrast injection was performed confirming intraluminal position. A rotating hemostatic valve was then attached to the back end of the microcatheter, and a pressurized and heparinized saline bag was attached to the catheter. 4 x 40 solitaire device was then selected. Back flush was achieved at the rotating hemostatic valve, and then the device was gently advanced through the microcatheter to the distal end.  The retriever was then unsheathed by withdrawing the microcatheter under fluoroscopy. Once the retriever was completely unsheathed, control angiogram was performed from the balloon catheter. The balloon at the balloon tip catheter was then inflated under fluoroscopy for proximal flow arrest. Constant aspiration was then performed at the tip of the balloon guide catheter as the retriever was gently and slowly withdrawn with fluoroscopic observation. Once the retriever was entirely removed from the system, free aspiration was confirmed at the hub of the balloon guide catheter, with free blood return confirmed. The balloon was then deflated, rotating hemostatic valve was reattached, and a control angiogram was performed. Control angiogram was performed at the right common femoral artery puncture site. Two overlapping suture mediated devices were used for right common femoral artery closure. Patient tolerated the procedure well and remained hemodynamically stable throughout. No complications were encountered and no significant blood loss encountered. IMPRESSION: Status post cerebral angiogram and mechanical thrombectomy of proximal occlusion of the superior and dominant MCA branch of the right hemisphere with restoration of TICI 3 flow. Suture mediated closure of right common femoral artery. Signed, Yvone NeuJaime  S. Loreta AveWagner, DO [facility] PLAN: Extubated in the interventional suite CT post procedure ICU admission Right leg straight until 8 a.m. Electronically Signed   By: Gilmer MorJaime  Dior Dominik D.O.   On: 04/29/2017 04:07   Ct Cerebral Perfusion W Contrast  Result Date: 04/29/2017 CLINICAL DATA:  Code stroke. EXAM: CT HEAD WITHOUT CONTRAST CT CEREBRAL PERFUSION WITH CONTRAST TECHNIQUE: Contiguous axial images were obtained from the base of the skull through the vertex without intravenous contrast. Following the administration of contrast material, CT perfusion scanning of the brain was performed. COMPARISON:  None. FINDINGS: Brain: No mass lesion or acute hemorrhage. No focal hypoattenuation of the basal ganglia or cortex to indicate infarcted tissue. There is periventricular hypoattenuation compatible with chronic microvascular disease. No hydrocephalus or age advanced atrophy. Vascular: No hyperdense vessel. No advanced atherosclerotic calcification of the arteries at the skull base. Skull: Normal visualized skull base, calvarium and extracranial soft tissues. Sinuses/Orbits: No sinus fluid levels or advanced mucosal thickening. No mastoid effusion. Normal orbits. ASPECTS (Alberta Stroke Program Early CT Score) - Ganglionic level infarction (caudate, lentiform nuclei, internal capsule, insula, M1-M3 cortex): 7 - Supraganglionic infarction (M4-M6 cortex): 3 Total score (0-10 with 10 being normal): 10 CT Brain Perfusion Findings: CBF (<30%) Volume: 0mL Perfusion (Tmax>6.0s) volume: 63mL Mismatch Volume: 63mL Ischemia Location:Anterior right MCA distribution IMPRESSION: 1. No acute hemorrhage or mass lesion. 2. ASPECTS is 10. 3. Elevated mean transit time within the anterior right MCA distribution without associated abnormal cerebral blood volume or cerebral blood flow likely indicates an area of acute ischemia without infarction. These results were called by telephone at  the time of interpretation on 04/29/2017 at 12:55 am to Dr. Ritta SlotMCNEILL KIRKPATRICK , who verbally acknowledged these results. Electronically Signed   By: Deatra RobinsonKevin  Herman M.D.   On: 04/29/2017 01:16   Dg Chest Port 1 View  Result Date: 05/01/2017 CLINICAL DATA:  CHF follow-up. EXAM: PORTABLE CHEST 1 VIEW COMPARISON:  April 30, 2017 FINDINGS: Stable cardiomegaly. The hila and mediastinum are normal. Mild vascular crowding in the medial right lung base. No pulmonary edema identified. IMPRESSION: Cardiomegaly.  No pulmonary edema identified.  No acute abnormality. Electronically Signed   By: Gerome Samavid  Williams III M.D   On: 05/01/2017 16:29   Dg Chest Port 1 View  Result Date: 04/30/2017 CLINICAL DATA:  Intracranial hemorrhage. EXAM: PORTABLE CHEST 1 VIEW  COMPARISON:  Yesterday. FINDINGS: Normal sized heart. Clear lungs with decreased prominence of the pulmonary vasculature. Improved inspiration. Stable enlarged cardiac silhouette. Unremarkable bones. IMPRESSION: Stable cardiomegaly with improved pulmonary vascular congestion, at least partly due to an improved inspiration. Electronically Signed   By: Beckie Salts M.D.   On: 04/30/2017 08:00   Dg Chest Port 1 View  Result Date: 04/29/2017 CLINICAL DATA:  Acute respiratory failure.  Stroke. EXAM: PORTABLE CHEST 1 VIEW COMPARISON:  Chest yesterday radiograph at [facility]. FINDINGS: Cardiomegaly accentuated by lower lung volumes. Crowding of bronchovascular structures with mild vascular congestion. Minimal retrocardiac atelectasis. No pleural fluid or pneumothorax. IMPRESSION: Cardiomegaly with mild vascular congestion. Electronically Signed   By: Rubye Oaks M.D.   On: 04/29/2017 05:32   Ir Percutaneous Art Thrombectomy/infusion Intracranial Inc Diag Angio  Result Date: 04/29/2017 INDICATION: 71 year old male with a history of acute right MCA stroke, left-sided symptoms, normal baseline, with stroke scale of 13, aspects 10, within the window for tPA.  He presents for angiogram and mechanical thrombectomy as a treatment for emergent large vessel occlusion. EXAM: ULTRASOUND GUIDED ACCESS RIGHT COMMON FEMORAL ARTERY CEREBRAL ANGIOGRAM MECHANICAL THROMBECTOMY RIGHT MCA BRANCHES CLOSURE OF RIGHT COMMON FEMORAL ARTERY ACCESS WITH SUTURE MEDIATED DEVICE COMPARISON:  CT IMAGING OF THE SAME DAY MEDICATIONS: 2.0 g Ancef. The antibiotic was administered within 1 hour of the procedure ANESTHESIA/SEDATION: General anesthesia with anesthesia team CONTRAST:  75 cc Isovue 300 FLUOROSCOPY TIME:  Fluoroscopy Time: 8 minutes 24 seconds (707 mGy). COMPLICATIONS: None TECHNIQUE: Informed written consent was obtained from the patient's family after a thorough discussion of the procedural risks, benefits and alternatives. Specific risks discussed include: Bleeding, infection, contrast reaction, kidney injury/failure, need for further procedure/surgery, arterial injury or dissection, embolization to new territory, intracranial hemorrhage (10-15% risk), neurologic deterioration, cardiopulmonary collapse, death. All questions were addressed. Maximal Sterile Barrier Technique was utilized including during the procedure including caps, mask, sterile gowns, sterile gloves, sterile drape, hand hygiene and skin antiseptic. A timeout was performed prior to the initiation of the procedure. FINDINGS: Initial: Right common carotid artery:  Normal course caliber and contour. Right external carotid artery: Patent with antegrade flow. Right internal carotid artery: Mild atherosclerotic changes with plaque at the carotid bifurcation. No significant stenosis at the bifurcation. Normal course caliber and contour of the cervical portion. Vertical and petrous segment patent with normal course caliber contour. Cavernous segment patent. Clinoid segment patent. Antegrade flow of the ophthalmic artery. Ophthalmic segment patent. Terminus patent. Right MCA: M1 segment is patent. There is an early arising  temporal branch, with downward course. Inferior division patent on the initial image. There is proximal occlusion of the superior and dominant segment. Right ACA: A 1 segment patent. A 2 segment perfuses the right territory. Final: Right MCA: Restoration of flow through the superior and dominant division, TICI 3 PROCEDURE: Patient was brought to the interventional suite, and identification of the patient was confirmed. Patient was placed supine position with gentle anesthesia initiated with the anesthesia team. The patient is then prepped and draped in the usual sterile fashion. Ultrasound survey of the right inguinal region was performed with images stored and sent to PACs. A micropuncture needle was used access the right common femoral artery under ultrasound. With excellent arterial blood flow returned, and an .018 micro wire was passed through the needle, observed enter the abdominal aorta under fluoroscopy. The needle was removed, and a micropuncture sheath was placed over the wire. The inner dilator and wire were removed, and an  035 Bentson wire was advanced under fluoroscopy into the abdominal aorta. The sheath was removed and a standard 5 [place] vascular sheath was placed. The dilator was removed and the sheath was flushed. Ultrasound survey of the left inguinal region was then performed with images stored and sent to PACs. A micropuncture needle was used access the left common femoral artery under ultrasound. With excellent arterial blood flow returned, an .018 micro wire was passed through the needle, observed to enter the abdominal aorta under fluoroscopy. The needle was removed, and a micropuncture sheath was placed over the wire. The inner dilator and wire were removed, and an 035 Bentson wire was advanced under fluoroscopy into the abdominal aorta. The sheath was removed and a standard 4 [place] vascular sheath was placed for arterial monitoring. Dilator was removed and the sheath was flushed and  attached to the transducer. A 61F JB-1 diagnostic catheter was advanced over the wire to the proximal descending thoracic aorta. Wire was then removed. Double flush of the catheter was performed. Catheter was then used to select the innominate artery. Angiogram was performed. Using roadmap technique, the catheter was advanced over a roadrunner wire into right common carotid artery. Formal angiogram was performed. Exchange length Rosen wire was then passed through the diagnostic catheter to the distal common carotid artery and the diagnostic catheter was removed. The 5 French sheath was removed and exchanged for 8 French 55 centimeter BrightTip sheath. Sheath was flushed and attached to pressurized and heparinized saline bag for constant forward flow. Then an 8 [place], 85 cm Flowgate balloon tip catheter was prepared on the back table with inflation of the balloon with 50/50 concentration of dilute contrast. The balloon catheter was then advanced over the wire, positioned into the distal common carotid artery. Copious back flush was performed and the balloon catheter was attached to heparinized and pressurized saline bag for forward flow. Flowgate balloon catheter was then advanced over the road runner wire into the mid internal carotid artery. Angiogram was performed for road map guide. Microcatheter system was then introduced through the balloon guide catheter, using a synchro soft 014 wire and a Trevo Provue18 catheter. Microcatheter system was advanced into the internal carotid artery, to the level of the occlusion. The micro wire was then carefully advanced through the occluded segment. Microcatheter was then push through the occluded segment and the wire was removed. Blood was then aspirated through the hub of the microcatheter, and a gentle contrast injection was performed confirming intraluminal position. A rotating hemostatic valve was then attached to the back end of the microcatheter, and a pressurized and  heparinized saline bag was attached to the catheter. 4 x 40 solitaire device was then selected. Back flush was achieved at the rotating hemostatic valve, and then the device was gently advanced through the microcatheter to the distal end. The retriever was then unsheathed by withdrawing the microcatheter under fluoroscopy. Once the retriever was completely unsheathed, control angiogram was performed from the balloon catheter. The balloon at the balloon tip catheter was then inflated under fluoroscopy for proximal flow arrest. Constant aspiration was then performed at the tip of the balloon guide catheter as the retriever was gently and slowly withdrawn with fluoroscopic observation. Once the retriever was entirely removed from the system, free aspiration was confirmed at the hub of the balloon guide catheter, with free blood return confirmed. The balloon was then deflated, rotating hemostatic valve was reattached, and a control angiogram was performed. Control angiogram was performed at the right  common femoral artery puncture site. Two overlapping suture mediated devices were used for right common femoral artery closure. Patient tolerated the procedure well and remained hemodynamically stable throughout. No complications were encountered and no significant blood loss encountered. IMPRESSION: Status post cerebral angiogram and mechanical thrombectomy of proximal occlusion of the superior and dominant MCA branch of the right hemisphere with restoration of TICI 3 flow. Suture mediated closure of right common femoral artery. Signed, Yvone Neu. Loreta Ave, DO [facility] PLAN: Extubated in the interventional suite CT post procedure ICU admission Right leg straight until 8 a.m. Electronically Signed   By: Gilmer Mor D.O.   On: 04/29/2017 04:07   Ct Head Code Stroke Wo Contrast  Result Date: 04/29/2017 CLINICAL DATA:  Code stroke. EXAM: CT HEAD WITHOUT CONTRAST  CT CEREBRAL PERFUSION WITH CONTRAST TECHNIQUE: Contiguous axial images were obtained from the base of the skull through the vertex without intravenous contrast. Following the administration of contrast material, CT perfusion scanning of the brain was performed. COMPARISON:  None. FINDINGS: Brain: No mass lesion or acute hemorrhage. No focal hypoattenuation of the basal ganglia or cortex to indicate infarcted tissue. There is periventricular hypoattenuation compatible with chronic microvascular disease. No hydrocephalus or age advanced atrophy. Vascular: No hyperdense vessel. No advanced atherosclerotic calcification of the arteries at the skull base. Skull: Normal visualized skull base, calvarium and extracranial soft tissues. Sinuses/Orbits: No sinus fluid levels or advanced mucosal thickening. No mastoid effusion. Normal orbits. ASPECTS (Alberta Stroke Program Early CT Score) - Ganglionic level infarction (caudate, lentiform nuclei, internal capsule, insula, M1-M3 cortex): 7 - Supraganglionic infarction (M4-M6 cortex): 3 Total score (0-10 with 10 being normal): 10 CT Brain Perfusion Findings: CBF (<30%) Volume: 0mL Perfusion (Tmax>6.0s) volume: 63mL Mismatch Volume: 63mL Ischemia Location:Anterior right MCA distribution IMPRESSION: 1. No acute hemorrhage or mass lesion. 2. ASPECTS is 10. 3. Elevated mean transit time within the anterior right MCA distribution without associated abnormal cerebral blood volume or cerebral blood flow likely indicates an area of acute ischemia without infarction. These results were called by telephone at the time of interpretation on 04/29/2017 at 12:55 am to Dr. Ritta Slot , who verbally acknowledged these results. Electronically Signed   By: Deatra Robinson M.D.   On: 04/29/2017 01:16    Labs:  CBC: Recent Labs    04/29/17 0112 04/30/17 0428 05/01/17 0950 05/02/17 0416  WBC 14.3* 21.6* 13.2* 10.8*  HGB 13.8 11.1* 11.3* 10.8*  HCT 39.6 32.3* 34.5* 32.1*  PLT 205  184 186 181    COAGS: No results for input(s): INR, APTT in the last 8760 hours.  BMP: Recent Labs    04/29/17 0112 04/30/17 0428 05/01/17 0950 05/02/17 0416  NA 133* 136 135 134*  K 4.4 4.0 4.2 3.5  CL 103 106 105 103  CO2 23 24 25 24   GLUCOSE 273* 165* 182* 159*  BUN 16 19 18 14   CALCIUM 8.8* 8.8* 8.5* 8.4*  CREATININE 0.89 0.81 0.82 0.79  GFRNONAA >60 >60 >60 >60  GFRAA >60 >60 >60 >60    LIVER FUNCTION TESTS: Recent Labs    04/29/17 0112  BILITOT 0.9  AST 22  ALT 22  ALKPHOS 92  PROT 7.2  ALBUMIN 3.7    Assessment and Plan: 1. S/p cerebral angiogram and mechanical thrombectomy of proximal occlusion of the superior and dominant MCA branch of the right hemisphere with restoration of TICI 3 flow  Patient improving.   On ASA and plavix Further plans  currently per neuro.    Electronically Signed: Letha Cape 05/02/2017, 10:59 AM   I spent a total of 15 Minutes at the the patient's bedside AND on the patient's hospital floor or unit, greater than 50% of which was counseling/coordinating care for CVA

## 2017-05-02 NOTE — Progress Notes (Signed)
Progress Note  Patient Name: Nathan [name] Date of Encounter: 05/02/2017  Primary Cardiologist: Dr Dulce Sellar  Subjective   No chest pain or dyspnea  Inpatient Medications    Scheduled Meds: . aspirin  325 mg Oral Daily  . carvedilol  6.25 mg Oral BID  . clopidogrel  75 mg Oral Daily  . glipiZIDE  10 mg Oral Daily  . heparin subcutaneous  5,000 Units Subcutaneous Q8H  . insulin aspart  0-15 Units Subcutaneous TID AC & HS  . lisinopril  5 mg Oral Daily  . pantoprazole  40 mg Oral Daily  . repaglinide  0.25 mg Oral TID WC  . simvastatin  40 mg Oral QHS  . tamsulosin  0.4 mg Oral Daily   Continuous Infusions: . sodium chloride 30 mL/hr at 05/01/17 1601  . diltiazem (CARDIZEM) infusion 5 mg/hr (05/02/17 0159)   PRN Meds: acetaminophen **OR** [DISCONTINUED] acetaminophen (TYLENOL) oral liquid 160 mg/5 mL **OR** [DISCONTINUED] acetaminophen, metoprolol tartrate, ondansetron (ZOFRAN) IV, senna-docusate   Vital Signs    Vitals:   05/01/17 2133 05/02/17 0114 05/02/17 0354 05/02/17 0700  BP: (!) 106/56 96/66 126/68 127/75  Pulse:  85 75 78  Resp:  (!) 24 (!) 24 18  Temp:  (!) 97.4 F (36.3 C) 98.6 F (37 C) 98.1 F (36.7 C)  TempSrc:  Axillary Oral Oral  SpO2:  97% 98% 97%  Weight:   250 lb 1.6 oz (113.4 kg)   Height:       No intake or output data in the 24 hours ending 05/02/17 0833 Filed Weights   04/29/17 1200 05/01/17 0900 05/02/17 0354  Weight: 248 lb 7.3 oz (112.7 kg) 254 lb 4.8 oz (115.3 kg) 250 lb 1.6 oz (113.4 kg)    Telemetry    Atrial fibrillation to NSR with occasional PVC- Personally Reviewed   Physical Exam   GEN: WD obese No acute distress.   Neck: No JVD Cardiac: RRR, no murmurs, rubs, or gallops.  Respiratory: Clear to auscultation bilaterally. GI: Soft, nontender, non-distended  MS: No edema Neuro:  Nonfocal  Psych: Normal affect   Labs    Chemistry Recent Labs  Lab 04/29/17 0112 04/30/17 0428 05/01/17 0950 05/02/17 0416    NA 133* 136 135 134*  K 4.4 4.0 4.2 3.5  CL 103 106 105 103  CO2 23 24 25 24   GLUCOSE 273* 165* 182* 159*  BUN 16 19 18 14   CREATININE 0.89 0.81 0.82 0.79  CALCIUM 8.8* 8.8* 8.5* 8.4*  PROT 7.2  --   --   --   ALBUMIN 3.7  --   --   --   AST 22  --   --   --   ALT 22  --   --   --   ALKPHOS 92  --   --   --   BILITOT 0.9  --   --   --   GFRNONAA >60 >60 >60 >60  GFRAA >60 >60 >60 >60  ANIONGAP 7 6 5 7      Hematology Recent Labs  Lab 04/30/17 0428 05/01/17 0950 05/02/17 0416  WBC 21.6* 13.2* 10.8*  RBC 3.52* 3.66* 3.50*  HGB 11.1* 11.3* 10.8*  HCT 32.3* 34.5* 32.1*  MCV 91.8 94.3 91.7  MCH 31.5 30.9 30.9  MCHC 34.4 32.8 33.6  RDW 12.7 12.9 12.5  PLT 184 186 181     Radiology    Dg Chest Port 1 View  Result Date: 05/01/2017 CLINICAL DATA:  CHF follow-up. EXAM: PORTABLE CHEST 1 VIEW COMPARISON:  April 30, 2017 FINDINGS: Stable cardiomegaly. The hila and mediastinum are normal. Mild vascular crowding in the medial right lung base. No pulmonary edema identified. IMPRESSION: Cardiomegaly.  No pulmonary edema identified.  No acute abnormality. Electronically Signed   By: Gerome Samavid  Williams III M.D   On: 05/01/2017 16:29     Patient Profile     71 year old male with past medical history of cardiomyopathy, diabetes mellitus, hypertension, mitral regurgitation admitted with CVA with newly diagnosed atrial fibrillation. Patient recently presented with left-sided weakness and received thrombolytic therapy. He had a distal M2 occlusion and underwent mechanical thrombectomy. Echocardiogram this admission shows ejection fraction 30-35% and moderate mitral regurgitation. Noted to have atrial fibrillation on telemetry and cardiology asked to evaluate.    Assessment & Plan    1 new-onset atrial fibrillation-likely cause of CVA. Echocardiogram shows ejection fraction 30-35%. Back in sinus. DC cardizem given reduced LV function and increase coreg to 25 mg BID (preadmission dose).  Await TSH. CHADS vasc 6. Would add apixaban when ok with neurology. At that time would DC ASA and plavix unless interventional radiology feels pt needs to continue given recent thrombectomy.  2 cardiomyopathy-we will need to review with Dr. Dulce SellarMunley. Question if patient has had a prior ischemia evaluation. This could be performed as an outpatient. DC cardizem and increase coreg as outlined; continue ACEI. Will need to reassess LV function in 3 months after meds titrated; if EF < 35 would need to consider ICD.  3 recent CVA-Neuro exam improved; management per neurology.  4 hypertension-blood pressure is controlled. Med changes as outlined.  5 hyperlipidemia-continue zocor.    For questions or updates, please contact [facility] Please consult [email or web address] for contact info under Cardiology/STEMI.      Signed, Olga MillersBrian Crenshaw, MD  05/02/2017, 8:33 AM

## 2017-05-02 NOTE — Care Management Important Message (Signed)
Important Message  Patient Details  Name: Nathan [name] MRN: [id number] Date of Birth: Sep 07, 1945   Medicare Important Message Given:  Yes    Kyla BalzarineShealy, Tajah Schreiner Abena 05/02/2017, 10:56 AM

## 2017-05-02 NOTE — Progress Notes (Signed)
STROKE TEAM PROGRESS NOTE   SUBJECTIVE (INTERVAL HISTORY) Patient found sitting in bed in no acute distress.  Wife at bedside.  Voices no new complaints.  No acute events reported per nursing overnight.  Seen by cardiology this morning and Cardizem drip discontinued. Will check TSH. Plan for repeat head CT in the morning to monitor hemorrhagic conversion.  Patient will be transferred out of stepdown unit today.  OBJECTIVE Recent Labs  Lab 05/01/17 1134 05/01/17 1718 05/01/17 2129 05/02/17 0740 05/02/17 1225  GLUCAP 206* 159* 146* 158* 160*   Recent Labs  Lab 04/29/17 0112 04/30/17 0428 05/01/17 0950 05/02/17 0416  NA 133* 136 135 134*  K 4.4 4.0 4.2 3.5  CL 103 106 105 103  CO2 23 24 25 24   GLUCOSE 273* 165* 182* 159*  BUN 16 19 18 14   CREATININE 0.89 0.81 0.82 0.79  CALCIUM 8.8* 8.8* 8.5* 8.4*   Recent Labs  Lab 04/29/17 0112  AST 22  ALT 22  ALKPHOS 92  BILITOT 0.9  PROT 7.2  ALBUMIN 3.7   Recent Labs  Lab 04/29/17 0112 04/30/17 0428 05/01/17 0950 05/02/17 0416  WBC 14.3* 21.6* 13.2* 10.8*  HGB 13.8 11.1* 11.3* 10.8*  HCT 39.6 32.3* 34.5* 32.1*  MCV 91.0 91.8 94.3 91.7  PLT 205 184 186 181   No results for input(s): CKTOTAL, CKMB, CKMBINDEX, TROPONINI in the last 168 hours. No results for input(s): LABPROT, INR in the last 72 hours. No results for input(s): COLORURINE, LABSPEC, PHURINE, GLUCOSEU, HGBUR, BILIRUBINUR, KETONESUR, PROTEINUR, UROBILINOGEN, NITRITE, LEUKOCYTESUR in the last 72 hours.  Invalid input(s): APPERANCEUR     Component Value Date/Time   CHOL 102 04/29/2017 0451   TRIG 104 04/29/2017 0451   HDL 30 (L) 04/29/2017 0451   CHOLHDL 3.4 04/29/2017 0451   VLDL 21 04/29/2017 0451   LDLCALC 51 04/29/2017 0451   Lab Results  Component Value Date   HGBA1C 8.4 (H) 04/29/2017   No results found for: LABOPIA, COCAINSCRNUR, LABBENZ, AMPHETMU, THCU, LABBARB  No results for input(s): ETH in the last 168 hours.  IMAGING I have personally  reviewed the radiological images below and agree with the radiology interpretations.  Ct Head Wo Contrast 04/29/2017 IMPRESSION:  1. Evolving infarction and right superior insula and frontal operculum measuring 4.6 cc within the region of perfusion anomaly on prior CT perfusion.  2. No new large territory acute infarction, hemorrhage, or mass effect.  3. Mild chronic microvascular ischemic changes and parenchymal volume loss of the brain.   Ct Head Wo Contrast 04/29/2017 IMPRESSION:  No intracranial hemorrhage or cytotoxic edema following vascular intervention.   [name] Head Wo Contrast 04/30/2017 MRI HEAD:  1. Mildly motion degraded examination.  2. Acute patchy RIGHT frontal lobe infarct, with petechial hemorrhage within insular component.  3. Mild to moderate chronic small vessel ischemic disease.  MRA HEAD:  1. Moderately motion degraded examination.  2. No emergent large vessel occlusion or flow-limiting stenosis.  3. Focal stenosis versus motion artifact RIGHT M2 origin.   Cerebral angiogram 04/29/2017 Status post cerebral angiogram and mechanical thrombectomy of proximal occlusion of the superior and dominant MCA branch of the right hemisphere with restoration of TICI 3 flow.   Ct Head Code Stroke Wo Contrast Ct Cerebral Perfusion W Contrast 04/29/2017 IMPRESSION:  1. No acute hemorrhage or mass lesion.  2. ASPECTS is 10.  3. Elevated mean transit time within the anterior right MCA distribution without associated abnormal cerebral blood volume or cerebral blood flow likely  indicates an area of acute ischemia without infarction.   Dg Chest Port 1 View 04/29/2017 IMPRESSION:  Cardiomegaly with mild vascular congestion.   Dg Chest Port 1 View 05/01/17 IMPRESSION: Cardiomegaly.  No pulmonary edema identified.  No acute abnormality.  Transthoracic Echocardiogram  Impressions: - Mildly dilated LV with mild LV hypertrophy. EF 30-35%.   Inferolateral akinesis, anterolateral  severe hypokinesis. Normal   RV size and systolic function. At least moderate, eccentric   posteriorly-directed mitral regurgitation. Wall motion   abnormalities raise concern for infarct-related MR.     Bilateral Lower Extremity Dopplers - No evidence of  DVT in the veins that were clearly visualized. The right common femoral vein was not imaged due to surgical bandages in place  EEG 04/29/2017  The EEG is abnormal and findings are suggestive of generalized and right fronto temporal focal cerebral dysfunction.Epileptiform activity was not seen during this recording    CT head repeat pending   PHYSICAL EXAM Temp:  [97.4 F (36.3 C)-98.6 F (37 C)] 98.4 F (36.9 C) (11/05 1100) Pulse Rate:  [75-98] 98 (11/05 1100) Resp:  [18-24] 21 (11/05 1100) BP: (91-131)/(56-75) 131/74 (11/05 1100) SpO2:  [96 %-98 %] 98 % (11/05 1100) Weight:  [113.4 kg (250 lb 1.6 oz)] 113.4 kg (250 lb 1.6 oz) (11/05 0354)    Vitals:   05/02/17 0114 05/02/17 0354 05/02/17 0700 05/02/17 1100  BP: 96/66 126/68 127/75 131/74  Pulse: 85 75 78 98  Resp: (!) 24 (!) 24 18 (!) 21  Temp: (!) 97.4 F (36.3 C) 98.6 F (37 C) 98.1 F (36.7 C) 98.4 F (36.9 C)  TempSrc: Axillary Oral Oral Oral  SpO2: 97% 98% 97% 98%  Weight:  113.4 kg (250 lb 1.6 oz)    Height:       General - Well nourished, well developed, in no apparent distress Respiratory - Unlabored breathing noted on exam Cardiovascular - Regular rate and rhythm   Neuro:   Mental Status: Patient is awake, alert, oriented to person, place, month, year, and situation. No signs of aphasia but patient does have mildly dysarthric speech.  Cranial Nerves: II: visual field full III,IV, VI: right gaze preference but able to cross midline, PERRL. V: Facial sensation symmetrical VII: Facial movement - Left facial droop VIII: hearing is intact to voice X: Uvula elevates symmetrically XI: Shoulder shrug is symmetric. XII: tongue is midline without atrophy or  fasciculations.  Motor: He has normal strength in the right side, Left Arm was 4/5,   hand 3/5 and Left Leg 4+/5.   sensory: Sensation is symmetrical Cerebellar: No ataxia on the bilaterally, but slow on the left   ASSESSMENT AND PLAN: Mr. Ileana LaddRandleman D Dayna RamusFerree Jr is a 71 y.o. male with diabetes mellitus and cardiomyopathy EF of 30% who presents with left-sided weakness. [facility] where he was given IV TPA 04/28/2017.  He had a CTA showing a relatively distal M2 occlusion, with very mild symptoms.  [facility] was [facility].  En route, he markedly worsened, after he arrived he was taken for a stat CT perfusion which demonstrates a large penumbra.IR was called and he was taken for IR intervention. The patient was extubated post procedure   Stroke:  Acute patchy RIGHT MCA infarct s/p tPA and mechanical thrombectomy with TICI3 reperfusion - likely embolic secondary to large vessel disease vs. cardiomyopathy with low EF.  Resultant right gaze preference, left facial droop, left hemiparesis  CT head -  No acute hemorrhage or mass lesion.   CT head and neck - distal right M2 occlusion  CTP right MCA penumbra  Cerebral angiogram - right superior M2 occlusion s/p TICI3 revascularization  MRI head - Acute patchy RIGHT frontal lobe infarct with petechial hemorrhage within insular component.   MRA head - Focal stenosis versus motion artifact RIGHT M2 origin.   2D Echo - EF of 30-35%. No cardiac source of emboli identified.  LE venous Doppler negative for DVT  LDL - 51  HgbA1c - 8.4  VTE prophylaxis -heparin subq  Diet Carb Modified Fluid consistency: Thin; Room service appropriate? Yes  aspirin 81 mg daily prior to admission, now on aspirin 325 mg daily and plavix 75mg  daily.   Will d/c ASA and Plavix and start Eliquis in AM if repeat Head CT shows no frank hemorrhage   Patient counseled to be compliant with his antithrombotic  medications  Ongoing aggressive stroke risk factor management  Therapy recommendations:  - HH PT  Disposition: Likely HOME in AM  afib RVR  New diagnosis   Cardizem drip discontinued overnight  Cardiology on board, appreciate recs  Continue coreg  On DAPT, Will discontinue DAPT in AM and start Eliquis if repeat CT head shows no frank hemorrhage.  Cardiomyopathy  EF 30-35%  Following with Dr. Dulce Sellar in [place]  On coreg and lisinopril  Cardiology on board  Close follow-up with cardiology after d/c.  Will need to reassess LV function in 3 months after meds titrated; if EF < 35 would need to consider ICD.  Uncontrolled diabetes  Hyperglycemia on admission  HgbA1c 8.4, goal < 7.0  Back on home meds  SSI  CBG monitoring  Hypotension  No further episodes documented  Likely due to low ejection fraction. On cardizem and low-dose Coreg and lisinopril  BP goal normotensive  Encourage po intake  CXR - No acute findings.   Hyperlipidemia  Home meds:  Zocor 40 mg daily resumed in hospital  LDL 51,  goal < 70  Continue statin at discharge  Other Stroke Risk Factors  Advanced age  ETOH use, advised to drink no more than 1 drink per day.  Obesity, Body mass index is 34.65 kg/m., recommend weight loss, diet and exercise as appropriate   Other Active Problems  Leukocytosis - 121.6->13.2->10.8, Remains afebrile   Hospital day # 4   Brita Romp Stroke Neurology 05/02/2017 1:11 PM  I reviewed above note and agree with the assessment and plan. I have made any additions or clarifications directly to the above note. Pt was seen and examined. Off cardizem, on home dose coreg and low dose ACEI. Repeat CT in am and will start eliquis if no frank bleeding. Close follow up with cardiology. Transfer to floor.  Marvel Plan, MD PhD Stroke Neurology 05/02/2017 4:13 PM     To contact Stroke Continuity provider, please refer to [email or web address]. After hours,  contact General Neurology

## 2017-05-02 NOTE — Progress Notes (Signed)
Physical Therapy Treatment Patient Details Name: Nathan [name] MRN: [id number] DOB: 27-Mar-1946 Today's Date: 05/02/2017    History of Present Illness Pt admitted on 04/29/17 with M2 occlusion with TPA given    PT Comments    Pt with noted impaired cognition. Specifally delayed processing, impaired sequencing ie. Washing of hands and stair negotiation, impaired insight to deficits, pt with mild L sided inattention, and noted impaired balance as demo'd by score of 13 on DGI. Pt with poor executive function and would require 24/7 supervision upon d/c home due to inability to cook, dress, or perform ADLs safely.   Follow Up Recommendations  Home health PT;Supervision/Assistance - 24 hour     Equipment Recommendations  None recommended by PT    Recommendations for Other Services       Precautions / Restrictions Precautions Precautions: Fall Restrictions Weight Bearing Restrictions: No    Mobility  Bed Mobility Overal bed mobility: Needs Assistance Bed Mobility: Supine to Sit     Supine to sit: Supervision     General bed mobility comments: pt up in chair upon PT arrival  Transfers Overall transfer level: Needs assistance Equipment used: None Transfers: Sit to/from Stand Sit to Stand: Min guard         General transfer comment: v/c's for safe hand placement  Ambulation/Gait Ambulation/Gait assistance: Min guard Ambulation Distance (Feet): 200 Feet Assistive device: None Gait Pattern/deviations: Step-through pattern;Decreased stride length;Drifts right/left Gait velocity: dec   General Gait Details: pt with mild instability more so when navigating around obstacles. Pt requires max directional v/c's.   Stairs Stairs: Yes   Stair Management: One rail Right;Step to pattern;Forwards Number of Stairs: 6 General stair comments: v/c's for squencing, mild DOE  Wheelchair Mobility    Modified Rankin (Stroke Patients Only) Modified Rankin (Stroke Patients  Only) Pre-Morbid Rankin Score: No significant disability Modified Rankin: Moderately severe disability     Balance Overall balance assessment: Needs assistance   Sitting balance-Leahy Scale: Good Sitting balance - Comments: no LOB with LB dressing    Standing balance support: No upper extremity supported Standing balance-Leahy Scale: Fair Standing balance comment: min guard for safety                 Standardized Balance Assessment Standardized Balance Assessment : Dynamic Gait Index   Dynamic Gait Index Level Surface: Mild Impairment Change in Gait Speed: Moderate Impairment Gait with Horizontal Head Turns: Mild Impairment Gait with Vertical Head Turns: Mild Impairment Gait and Pivot Turn: Mild Impairment Step Over Obstacle: Moderate Impairment Step Around Obstacles: Moderate Impairment Steps: Mild Impairment Total Score: 13      Cognition Arousal/Alertness: Awake/alert Behavior During Therapy: WFL for tasks assessed/performed Overall Cognitive Status: Impaired/Different from baseline Area of Impairment: Attention;Memory;Following commands;Safety/judgement;Problem solving                   Current Attention Level: Focused Memory: Decreased short-term memory Following Commands: Follows multi-step commands inconsistently;Follows one step commands with increased time Safety/Judgement: Decreased awareness of deficits;Decreased awareness of safety   Problem Solving: Slow processing;Decreased initiation;Difficulty sequencing;Requires verbal cues General Comments: pt unable to wash hands with max v/c's. pt unable to find soap on the L side, once pt found soap he then used paper towl to wipe it off without rinsing his hands. Pt with difficulty following simple commands during DGI      Exercises      General Comments        Pertinent Vitals/Pain Pain Assessment: No/denies pain  Home Living Family/patient expects to be discharged to:: Private  residence Living Arrangements: Spouse/significant other Available Help at Discharge: Family;Available 24 hours/day(son can also stay with pt) Type of Home: House Home Access: Level entry   Home Layout: One level Home Equipment: None      Prior Function Level of Independence: Independent      Comments: drives, likes to cook, wife states pt was having memory deficits prior to admission   PT Goals (current goals can now be found in the care plan section) Acute Rehab PT Goals Patient Stated Goal: go home Progress towards PT goals: Progressing toward goals    Frequency    Min 4X/week      PT Plan Current plan remains appropriate    Co-evaluation              AM-PAC PT "6 Clicks" Daily Activity  Outcome Measure  Difficulty turning over in bed (including adjusting bedclothes, sheets and blankets)?: A Little Difficulty moving from lying on back to sitting on the side of the bed? : A Little Difficulty sitting down on and standing up from a chair with arms (e.g., wheelchair, bedside commode, etc,.)?: A Little Help needed moving to and from a bed to chair (including a wheelchair)?: A Little Help needed walking in hospital room?: A Little Help needed climbing 3-5 steps with a railing? : A Lot 6 Click Score: 17    End of Session Equipment Utilized During Treatment: Gait belt Activity Tolerance: Patient tolerated treatment well Patient left: in chair;with call bell/phone within reach;with chair alarm set;with family/visitor present Nurse Communication: Mobility status PT Visit Diagnosis: Unsteadiness on feet (R26.81);Difficulty in walking, not elsewhere classified (R26.2);Other symptoms and signs involving the nervous system (R29.898)     Time: 1203-1226 PT Time Calculation (min) (ACUTE ONLY): 23 min  Charges:  $Gait Training: 8-22 mins $Neuromuscular Re-education: 8-22 mins                    G Codes:       Lewis ShockAshly Lukas Pelcher, PT, DPT Pager #: [phone] Office #:  [phone]    [name] 05/02/2017, 1:14 PM

## 2017-05-03 ENCOUNTER — Encounter (HOSPITAL_COMMUNITY): Payer: Self-pay

## 2017-05-03 ENCOUNTER — Inpatient Hospital Stay (HOSPITAL_COMMUNITY): Payer: Medicare Other

## 2017-05-03 LAB — BASIC METABOLIC PANEL
Anion gap: 8 (ref 5–15)
BUN: 13 mg/dL (ref 6–20)
CHLORIDE: 103 mmol/L (ref 101–111)
CO2: 24 mmol/L (ref 22–32)
CREATININE: 0.79 mg/dL (ref 0.61–1.24)
Calcium: 8.9 mg/dL (ref 8.9–10.3)
GFR calc Af Amer: >60 mL/min (ref >60)
GFR calc non Af Amer: >60 mL/min (ref >60)
Glucose, Bld: 150 mg/dL — ABNORMAL HIGH (ref 65–99)
POTASSIUM: 3.8 mmol/L (ref 3.5–5.1)
SODIUM: 135 mmol/L (ref 135–145)

## 2017-05-03 LAB — TSH: TSH: 2.166 u[IU]/mL (ref 0.350–4.500)

## 2017-05-03 LAB — CBC
HEMATOCRIT: 33.2 % — AB (ref 39.0–52.0)
HEMOGLOBIN: 11.1 g/dL — AB (ref 13.0–17.0)
MCH: 30.5 pg (ref 26.0–34.0)
MCHC: 33.4 g/dL (ref 30.0–36.0)
MCV: 91.2 fL (ref 78.0–100.0)
Platelets: 183 10*3/uL (ref 150–400)
RBC: 3.64 MIL/uL — AB (ref 4.22–5.81)
RDW: 12.6 % (ref 11.5–15.5)
WBC: 10.6 10*3/uL — ABNORMAL HIGH (ref 4.0–10.5)

## 2017-05-03 LAB — T4, FREE: FREE T4: 1.14 ng/dL — AB (ref 0.61–1.12)

## 2017-05-03 LAB — GLUCOSE, CAPILLARY: Glucose-Capillary: 150 mg/dL — ABNORMAL HIGH (ref 65–99)

## 2017-05-03 MED ORDER — APIXABAN 5 MG PO TABS
5.0000 mg | ORAL_TABLET | Freq: Two times a day (BID) | ORAL | Status: DC
  Administered 2017-05-03: 5 mg via ORAL
  Filled 2017-05-03: qty 1

## 2017-05-03 MED ORDER — APIXABAN 5 MG PO TABS: 5.0000 mg | ORAL_TABLET | Freq: Two times a day (BID) | ORAL | 3 refills | Status: DC

## 2017-05-03 NOTE — Discharge Instructions (Addendum)
·   Disposition:  HOME  Eliquis (apixaban) daily for secondary stroke prevention - 5 mg twice a day  Ongoing risk factor control by Primary Care Physician and follow-up Hadley Penobbins, Robert A, MD in 2 weeks.  Follow-up with Neurology Stroke Clinic in 6 weeks, office to schedule an appointment.  Follow up with Cardiology, Dr Dulce SellarMunley in 2 weeks  Follow up with interventional radiology Dr Loreta AveWagner in 2 weeks  Home Health for PT/OT   Information on my medicine - ELIQUIS (apixaban)  Why was Eliquis prescribed for you? Eliquis was prescribed for you to reduce the risk of a blood clot forming that can cause a stroke if you have a medical condition called atrial fibrillation (a type of irregular heartbeat).  What do You need to know about Eliquis ? Take your Eliquis TWICE DAILY - one tablet in the morning and one tablet in the evening with or without food. If you have difficulty swallowing the tablet whole please discuss with your pharmacist how to take the medication safely.  Take Eliquis exactly as prescribed by your doctor and DO NOT stop taking Eliquis without talking to the doctor who prescribed the medication.  Stopping may increase your risk of developing a stroke.  Refill your prescription before you run out.  After discharge, you should have regular check-up appointments with your healthcare provider that is prescribing your Eliquis.  In the future your dose may need to be changed if your kidney function or weight changes by a significant amount or as you get older.  What do you do if you miss a dose? If you miss a dose, take it as soon as you remember on the same day and resume taking twice daily.  Do not take more than one dose of ELIQUIS at the same time to make up a missed dose.  Important Safety Information A possible side effect of Eliquis is bleeding. You should call your healthcare provider right away if you experience any of the following: ? Bleeding from an injury or your  nose that does not stop. ? Unusual colored urine (red or dark brown) or unusual colored stools (red or black). ? Unusual bruising for unknown reasons. ? A serious fall or if you hit your head (even if there is no bleeding).  Some medicines may interact with Eliquis and might increase your risk of bleeding or clotting while on Eliquis. To help avoid this, consult your healthcare provider or pharmacist prior to using any new prescription or non-prescription medications, including herbals, vitamins, non-steroidal anti-inflammatory drugs (NSAIDs) and supplements.  This website has more information on Eliquis (apixaban): http://[email or web address].com/eliquis/home

## 2017-05-03 NOTE — Discharge Summary (Signed)
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 Diabetes mellitus without complication [facility](HCC)    Past Surgical History:  Procedure Laterality Date  . IR PERCUTANEOUS ART THROMBECTOMY/INFUSION INTRACRANIAL INC DIAG ANGIO  04/29/2017  . IR US GUIDE VASC ACCESS LEFT  04/29/2017  . IR US GUIDE VASC ACCESS RIGHT  04/29/2017    Allergies as of 05/03/2017   Not on File     Medication List    STOP taking these medications   aspirin EC 81 MG tablet     TAKE these medications   apixaban 5 MG Tabs tablet Commonly known as:  ELIQUIS Take 1 tablet (5 mg total) 2 (two) times daily by mouth.   carvedilol 25 MG tablet Commonly known as:  COREG Take 25 mg by mouth 2 (two) times daily.   cyclobenzaprine 10 MG tablet Commonly known as:  FLEXERIL Take 10 mg by mouth 2 (two) times daily as needed.   glipiZIDE 10 MG 24 hr tablet Commonly known as:  GLUCOTROL XL Take 10 mg by mouth daily.   lisinopril 5 MG tablet Commonly known as:  PRINIVIL,ZESTRIL Take 5 mg by mouth daily.   meclizine 25 MG tablet Commonly known as:  ANTIVERT Take 25 mg by mouth daily as needed.   meloxicam 15 MG tablet Commonly known as:  MOBIC Take 15 mg by mouth daily as needed.   mupirocin ointment 2 % Commonly known as:  BACTROBAN Apply 1  application topically daily as needed.   naproxen sodium 220 MG tablet Commonly known as:  ALEVE Take 220 mg by mouth daily as needed (pain).   repaglinide 0.5 MG tablet Commonly known as:  PRANDIN Take 0.5 tablets by mouth 3 (three) times daily with meals.   simvastatin 40 MG tablet Commonly known as:  ZOCOR Take 40 mg by mouth at bedtime.   tamsulosin 0.4 MG Caps capsule Commonly known as:  FLOMAX Take 0.4 mg by mouth daily.       LABORATORY STUDIES CBC    Component Value Date/Time   WBC 10.6 (H) 05/03/2017 0627   RBC 3.64 (L) 05/03/2017 0627   HGB 11.1 (L) 05/03/2017 0627   HCT 33.2 (L) 05/03/2017 0627   PLT 183 05/03/2017 0627   MCV 91.2 05/03/2017 0627   MCH 30.5 05/03/2017 0627   MCHC 33.4 05/03/2017 0627   RDW 12.6 05/03/2017 0627   CMP    Component Value Date/Time   NA 135 05/03/2017 0627   K 3.8 05/03/2017 0627   CL 103 05/03/2017 0627   CO2 24 05/03/2017 0627   GLUCOSE 150 (H) 05/03/2017 0627   BUN 13 05/03/2017 0627   CREATININE 0.79 05/03/2017 0627   CALCIUM 8.9 05/03/2017 0627   PROT 7.2 04/29/2017 0112   ALBUMIN 3.7  04/29/2017 0112   AST 22 04/29/2017 0112   ALT 22 04/29/2017 0112   ALKPHOS 92 04/29/2017 0112   BILITOT 0.9 04/29/2017 0112   GFRNONAA >60 05/03/2017 0627   GFRAA >60 05/03/2017 0627   COAGSNo results found for: INR, PROTIME Lipid Panel    Component Value Date/Time   CHOL 102 04/29/2017 0451   TRIG 104 04/29/2017 0451   HDL 30 (L) 04/29/2017 0451   CHOLHDL 3.4 04/29/2017 0451   VLDL 21 04/29/2017 0451   LDLCALC 51 04/29/2017 0451   HgbA1C  Lab Results  Component Value Date   HGBA1C 8.4 (H) 04/29/2017   Urinalysis No results found for: COLORURINE, APPEARANCEUR, LABSPEC, PHURINE, GLUCOSEU, HGBUR, BILIRUBINUR, KETONESUR, PROTEINUR, UROBILINOGEN, NITRITE, LEUKOCYTESUR Urine Drug Screen No results found for: LABOPIA, COCAINSCRNUR, LABBENZ, AMPHETMU, THCU, LABBARB  Alcohol Level No results found for: ETH  TSH 0.350 -  4.500 uIU/mL 2.166    Free T4 0.61 - 1.12 ng/dL 1.611.14 Abnormally high     SIGNIFICANT DIAGNOSTIC STUDIES Ct Head Wo Contrast 04/29/2017 IMPRESSION:  1. Evolving infarction and right superior insula and frontal operculum measuring 4.6 cc within the region of perfusion anomaly on prior CT perfusion.  2. No new large territory acute infarction, hemorrhage, or mass effect.  3. Mild chronic microvascular ischemic changes and parenchymal volume loss of the brain.   Ct Head Wo Contrast 04/29/2017 IMPRESSION:  No intracranial hemorrhage or cytotoxic edema following vascular intervention.   Mr Maxine GlennMra Head Wo Contrast 04/30/2017 MRI HEAD:  1. Mildly motion degraded examination.  2. Acute patchy RIGHT frontal lobe infarct, with petechial hemorrhage within insular component.  3. Mild to moderate chronic small vessel ischemic disease.  MRA HEAD:  1. Moderately motion degraded examination.  2. No emergent large vessel occlusion or flow-limiting stenosis.  3. Focal stenosis versus motion artifact RIGHT M2 origin.   Cerebral angiogram 04/29/2017 Status post cerebral angiogram and mechanical thrombectomy of proximal occlusion of the superior and dominant MCA branch of the right hemisphere with restoration of TICI 3 flow.   Ct Head Code Stroke Wo Contrast Ct Cerebral Perfusion W Contrast 04/29/2017 IMPRESSION:  1. No acute hemorrhage or mass lesion.  2. ASPECTS is 10.  3. Elevated mean transit time within the anterior right MCA distribution without associated abnormal cerebral blood volume or cerebral blood flow likely indicates an area of acute ischemia without infarction.   Dg Chest Port 1 View 04/29/2017 IMPRESSION:  Cardiomegaly with mild vascular congestion.   Dg Chest Port 1 View 05/01/17 IMPRESSION: Cardiomegaly. No pulmonary edema identified. No acute abnormality.  Transthoracic Echocardiogram  Impressions: - Mildly dilated LV with mild LV hypertrophy. EF  30-35%. Inferolateral akinesis, anterolateral severe hypokinesis. Normal RV size and systolic function. At least moderate, eccentric posteriorly-directed mitral regurgitation. Wall motion abnormalities raise concern for infarct-related MR.     Bilateral Lower Extremity Dopplers - No evidence of DVT in the veins that were clearly visualized. The right common femoral vein was not imaged due to surgical bandages in place  EEG 04/29/2017  The EEG is abnormal and findings are suggestive of generalized and right fronto temporal focal cerebral dysfunction.Epileptiform activity was not seen during this recording    05/03/17 Repeat Head CT IMPRESSION: Acute infarct in the insula and right frontal operculum unchanged from prior MRI. No hemorrhage or other acute abnormality.   HISTORY OF PRESENT ILLNESS [name] is a 71 y.o. male with diabetes mellitus and cardiomyopathy EF of 30% who presents with left-sided weakness.  He was taken to [name] ER where he was given IV TPA 04/28/2017. He had a CTA showing a relatively distal M2 occlusion, with very mild symptoms. He was accepted for transferred to [facility]. En route, he markedly worsened, after he arrived he was taken for a stat CT perfusion which demonstrates a large penumbra.IR was called and he was taken for IR intervention. The patient was extubated post procedure   05/03/17: Repeat Head CT negative for bleeding. ASA and Plavix d/c'ed and started on Eliquis 5 mg BID. Patient exam improving slowly. Voices no new concerns or worsening symptoms. Requesting to be discharged home. Wife at bedside. POC reviewed with both patient and wife. Both verbalizes good understanding.  HOSPITAL COURSE Stroke:  Acute patchy RIGHT MCA infarct s/p tPA and mechanical thrombectomy with TICI3 reperfusion - likely embolic secondary to large vessel disease vs. cardiomyopathy with low EF.  Resultant left mild facial droop, left mild  hemiparesis  CT head and neck - distal right M2 occlusion  CTP right MCA penumbra  Cerebral angiogram - right superior M2 occlusion s/p TICI3 revascularization  MRI head - Acute patchy RIGHT frontal lobe infarct with petechial hemorrhage within insular component.   MRA head - Focal stenosis versus motion artifact RIGHT M2 origin.   2D Echo - EF of 30-35%. No cardiac source of emboli identified.  Repeat CT head 05/03/17- No acute hemorrhage or mass lesion.   LE venous Doppler negative for DVT  LDL - 51  HgbA1c - 8.4  VTE prophylaxis -heparin subq  Diet Carb Modified Fluid consistency: Thin; Room service appropriate? Yes  aspirin 81 mg daily prior to admission, now on Eliquis 5 mg BID    ASA and Plavix discontinued 05/03/17, Eliquis 5 mg BID started today  Patient counseled to be compliant with his antithrombotic medications  Ongoing aggressive stroke risk factor management  Therapy recommendations:  - HH PT/OT  Disposition: HOME   afib RVR  New diagnosis   Cardizem drip discontinued  Cardiology on board, appreciate recs  Continue coreg  DAPT discontinue 05/03/17, Eliquis 5mg  BID started today  Cardiomyopathy  EF 30-35%  Following with Dr. Dulce Sellar in [place], appointment in 2 weeks  On coreg and lisinopril  Close follow-up with cardiology after d/c for cardiomyopathy and ischemic evaluation .  As per cardiology consult, consider to repeat ECHO to reassess LV function in 3 months after meds titrated; if EF < 35 would need to consider ICD.  Uncontrolled diabetes  Hyperglycemia on admission  HgbA1c 8.4, goal < 7.0  Back on home meds  SSI  CBG monitoring  Hypotension and HTN  No further episodes documented  Likely due to low ejection fraction. On cardizem and low-dose Coreg and lisinopril              BP goal normotensive              Encourage po intake              CXR - No acute findings.   Hyperlipidemia  Home meds:  Zocor 40 mg daily  resumed in hospital  LDL 51,  goal < 70  Continue statin at discharge   Slightly elevated Free T4  TSH normal with slightly elevated Free T4.  PCP can monitor and recheck at follow up appointment.  Other Stroke Risk Factors  Advanced age  ETOH use, advised to drink no more than 1 drink per day.  Obesity, Body mass index is 34.65 kg/m., recommend weight  loss, diet and exercise as appropriate   Other Active Problems  Leukocytosis - 121.6->13.2->10.8, >10.6  Remains afebrile  DISCHARGE EXAM Blood pressure (!) 142/99, pulse 86, temperature 97.9 F (36.6 C), temperature source Oral, resp. rate 20, height 5\' 11"  (1.803 m), weight 107.2 kg (236 lb 6.4 oz), SpO2 100 %.  General - Well nourished, well developed, in no apparent distress Respiratory - Unlabored breathing noted on exam Cardiovascular - Regular rate and rhythm   Neuro:   Mental Status: Patient is awake, alert, oriented to person, place, month, year, and situation. No signs of aphasia but patient does have mildly dysarthric speech - improving.  Cranial Nerves: II: visual field full III,IV, VI: right gaze preference but able to cross midline, PERRL. V: Facial sensation symmetrical VII: Facial movement - Left facial droop - improving VIII: hearing is intact to voice X: Uvula elevates symmetrically XI: Shoulder shrug is symmetric. XII: tongue is midline without atrophy or fasciculations.  Motor: He has normal strength in the right side, Left Arm was 4/5,   hand 3/5 and Left Leg 4+/5.   sensory: Sensation is symmetrical Cerebellar: No ataxia on the bilaterally, but slow on the left  Discharge Diet   Diet Carb Modified Fluid consistency: Thin; Room service appropriate? Yes liquids  DISCHARGE PLAN  Disposition:  HOME  Eliquis (apixaban) daily for secondary stroke prevention - 5 mg BID  Ongoing risk factor control by Primary Care Physician at time of discharge  Follow-up [name], MD in 2  weeks.  Follow-up with Neurology Stroke Clinic, Ola Spurr in 6 weeks, office to schedule an appointment.  Follow up with Cardiology, Dr Dulce Sellar in 2 weeks  Follow up with IR Dr Loreta Ave in 2 weeks  Home Health for PT/OT  Greater than 30 minutes were spent preparing discharge.  Beryl Meager, ANP-C Stroke Neurology 05/03/2017  I reviewed above note and agree with the assessment and plan. I have made any additions or clarifications directly to the above note. Pt was seen and examined. Stop DAPT and start eliquis today. OK for discharge but follow up closely with cardiology for EF recheck. Follow up with PCP for slightly elevated FT4 but TSH was normal. Continue home PT/OT and will follow up with stroke clinic at [facility]  Marvel Plan, MD PhD Stroke Neurology 05/03/2017 1:41 PM

## 2017-05-03 NOTE — Progress Notes (Signed)
Physical Therapy Treatment Patient Details Name: Nathan [name] MRN: [id number] DOB: [birth year] Today's Date: 05/03/2017    History of Present Illness Pt admitted on 04/29/17 with M2 occlusion with TPA given    PT Comments    Pt has improved in gait stability given DGI scores are no in the lower risk for falls category.  Pt still in need of further therapy.   Follow Up Recommendations  Home health PT;Supervision/Assistance - 24 hour     Equipment Recommendations  None recommended by PT    Recommendations for Other Services       Precautions / Restrictions Precautions Precautions: Fall    Mobility  Bed Mobility Overal bed mobility: Modified Independent                Transfers Overall transfer level: Needs assistance   Transfers: Sit to/from Stand Sit to Stand: Supervision            Ambulation/Gait Ambulation/Gait assistance: Supervision Ambulation Distance (Feet): 250 Feet Assistive device: None Gait Pattern/deviations: Step-through pattern Gait velocity: decreased Gait velocity interpretation: Below normal speed for age/gender General Gait Details: pt still with very mild unsteadiness, but manages to scan, change speed and direction, step over objects and go up /down steps without any overt LOB   Stairs Stairs: Yes   Stair Management: One rail Right;Alternating pattern;Forwards Number of Stairs: 6 General stair comments: descent less controlled the ascent, but still safe if holds the rail  Wheelchair Mobility    Modified Rankin (Stroke Patients Only) Modified Rankin (Stroke Patients Only) Pre-Morbid Rankin Score: No significant disability Modified Rankin: Moderate disability     Balance Overall balance assessment: Needs assistance   Sitting balance-Leahy Scale: Good(to normal) Sitting balance - Comments: no LOB with LB dressing      Standing balance-Leahy Scale: Fair Standing balance comment: supervision                      Dynamic Gait Index Level Surface: Mild Impairment Change in Gait Speed: Mild Impairment Gait with Horizontal Head Turns: Normal Gait with Vertical Head Turns: Normal Gait and Pivot Turn: Mild Impairment Step Over Obstacle: Mild Impairment Step Around Obstacles: Mild Impairment Steps: Mild Impairment Total Score: 18      Cognition Arousal/Alertness: Awake/alert Behavior During Therapy: WFL for tasks assessed/performed Overall Cognitive Status: Within Functional Limits for tasks assessed(still some confusion, but improved-- not tested formally)                                        Exercises      General Comments        Pertinent Vitals/Pain Pain Assessment: No/denies pain    Home Living                      Prior Function            PT Goals (current goals can now be found in the care plan section) Acute Rehab PT Goals Patient Stated Goal: go home PT Goal Formulation: With patient/family Time For Goal Achievement: 05/14/17 Potential to Achieve Goals: Good Progress towards PT goals: Progressing toward goals    Frequency    Min 4X/week      PT Plan Current plan remains appropriate    Co-evaluation              AM-PAC PT "6 Clicks"  Daily Activity  Outcome Measure  Difficulty turning over in bed (including adjusting bedclothes, sheets and blankets)?: None Difficulty moving from lying on back to sitting on the side of the bed? : None Difficulty sitting down on and standing up from a chair with arms (e.g., wheelchair, bedside commode, etc,.)?: None Help needed moving to and from a bed to chair (including a wheelchair)?: A Little Help needed walking in hospital room?: A Little Help needed climbing 3-5 steps with a railing? : A Little 6 Click Score: 21    End of Session   Activity Tolerance: Patient tolerated treatment well Patient left: in bed;with call bell/phone within reach;with family/visitor present Nurse  Communication: Mobility status PT Visit Diagnosis: Unsteadiness on feet (R26.81);Other symptoms and signs involving the nervous system (R29.898)     Time: [id number] PT Time Calculation (min) (ACUTE ONLY): 16 min  Charges:  $Gait Training: 8-22 mins                    G Codes:       05/03/2017  [place] BingKen Tadd Holtmeyer, PT [phone]  (pager)   Nathan [name] 05/03/2017, 2:35 PM

## 2017-05-03 NOTE — Progress Notes (Signed)
Occupational Therapy Treatment Patient Details Name: Nathan [name] MRN: [id number] DOB: 1945/09/01 Today's Date: 05/03/2017    History of present illness Pt admitted on 04/29/17 with M2 occlusion with TPA given   OT comments  Pt demonstrating some improvement with attention and sequencing during standing grooming and with dressing today. Verbalizing better insight into deficits and safety, but will need 24 hour supervision at home.   Follow Up Recommendations  Home health OT;Supervision/Assistance - 24 hour    Equipment Recommendations       Recommendations for Other Services      Precautions / Restrictions Precautions Precautions: Fall       Mobility Bed Mobility Overal bed mobility: Modified Independent                Transfers Overall transfer level: Needs assistance Equipment used: None Transfers: Sit to/from Stand Sit to Stand: Supervision              Balance Overall balance assessment: Needs assistance   Sitting balance-Leahy Scale: Good     Standing balance support: No upper extremity supported Standing balance-Leahy Scale: Fair                             ADL either performed or assessed with clinical judgement   ADL Overall ADL's : Needs assistance/impaired Eating/Feeding: Independent;Sitting   Grooming: Wash/dry hands;Wash/dry face;Brushing hair;Standing;Moderate assistance Grooming Details (indicate cue type and reason): cues for sequencing, to locate ADL items on L and for thoroughness         Upper Body Dressing : Minimal assistance;Sitting   Lower Body Dressing: Supervision/safety;Set up;Sitting/lateral leans               Functional mobility during ADLs: Min guard General ADL Comments: reinforced education on safety during ADL and IADL     Vision       Perception     Praxis      Cognition Arousal/Alertness: Awake/alert Behavior During Therapy: WFL for tasks assessed/performed(labile at  times) Overall Cognitive Status: Impaired/Different from baseline Area of Impairment: Orientation;Attention;Safety/judgement;Problem solving                 Orientation Level: Disoriented to;Time Current Attention Level: Sustained Memory: Decreased short-term memory Following Commands: Follows one step commands with increased time Safety/Judgement: Decreased awareness of deficits;Decreased awareness of safety   Problem Solving: Slow processing;Decreased initiation;Difficulty sequencing;Requires verbal cues General Comments: pt needing cues to sequence and persist during ADL at sink        Exercises     Shoulder Instructions       General Comments      Pertinent Vitals/ Pain       Pain Assessment: No/denies pain  Home Living                                          Prior Functioning/Environment              Frequency  Min 3X/week        Progress Toward Goals  OT Goals(current goals can now be found in the care plan section)  Progress towards OT goals: Progressing toward goals  Acute Rehab OT Goals Patient Stated Goal: go home OT Goal Formulation: With patient Time For Goal Achievement: 05/16/17 Potential to Achieve Goals: Good  Plan Discharge plan remains appropriate  Co-evaluation                 AM-PAC PT "6 Clicks" Daily Activity     Outcome Measure   Help from another person eating meals?: None Help from another person taking care of personal grooming?: A Little Help from another person toileting, which includes using toliet, bedpan, or urinal?: A Little Help from another person bathing (including washing, rinsing, drying)?: A Little Help from another person to put on and taking off regular upper body clothing?: A Little Help from another person to put on and taking off regular lower body clothing?: A Little 6 Click Score: 19    End of Session Equipment Utilized During Treatment: Gait belt  OT Visit Diagnosis:  Other symptoms and signs involving cognitive function;Other abnormalities of gait and mobility (R26.89)   Activity Tolerance Patient tolerated treatment well   Patient Left in chair;with call bell/phone within reach;with bed alarm set   Nurse Communication          Time: [id number] OT Time Calculation (min): 19 min  Charges: OT General Charges $OT Visit: 1 Visit OT Treatments $Self Care/Home Management : 8-22 mins  05/03/2017 Martie RoundJulie Beauford [name], OTR/L Pager: [phone]  Nathan PlanasMayberry, Nathan BailiffJulie [name] 05/03/2017, 8:46 AM

## 2017-05-03 NOTE — Consult Note (Signed)
[facility] CM Primary Care Navigator  05/03/2017  Nathan [name] 25-Mar-1946 [id number]   Met with patient and wife Nathan [name]) in theroom to identify possible discharge needs.  Wife reports that patient was having slurred speech and facial drooping when she arrived at their home that had resulted to this admission.   Patient reports that hisprimary care provider is Dr. Audree Camel. Unk Lightning with [facility].  Called the primary care provider's office (spoke with Janett Billow) and she confirmed that patient is currently under the service of Dr. Unk Lightning in their office. She mentioned that this practice is under [facility] which is not under [facility] network.  Patient agreed when encouraged to follow-up with his primary care providerafter hospital discharge or when he returns back home in order to help him manage his health issues.   For questions, please contact:  Dannielle Huh, BSN, RN- [facility] Primary Care Navigator  Telephone: [phone] [place]

## 2017-05-03 NOTE — Care Management Note (Signed)
Case Management Note  Patient Details  Name: Nathan [name] D Fedorko Jr MRN: [id number] Date of Birth: September 23, 1945  Subjective/Objective:  Pt presented for left sided weakness.  [facility] Pt is from home with wife. New Eliquis: Benefits Check completed and cost will be $45.00 per month. Pt was given 30 day free card for free 30 day supply. Pt will need separate Rx for 30 day supply no refills and the original Rx with refills. [facility] [address] [facility] [address]                 Action/Plan: PT/OT for Home Recommendations: Agency List provided and pt / [facility]. CM did make referral and SOC to begin within 24-48 hours post d/c. No further needs from CM at this time.   Expected Discharge Date:  05/03/17               Expected Discharge Plan:  Home w Home Health Services  In-House Referral:  NA  Discharge planning Services  CM Consult  Post Acute Care Choice:  Home Health Choice offered to:  Patient, Spouse  DME Arranged:  N/A DME Agency:  NA  HH Arranged:  PT, OT HH Agency:  [facility]  Status of Service:  Completed, signed off  If discussed at Long Length of Stay Meetings, dates discussed:    Additional Comments:  Gala LewandowskyGraves-Bigelow, Yandriel Boening Kaye, RN 05/03/2017, 11:55 AM

## 2017-05-03 NOTE — Progress Notes (Signed)
Patient discharged home with family, spouse [name]  present at the time of discharged. All d/c materials given and explained to pt and spouse. Questions were answered. Pharmacist into room to reinforce apiixaban teaching. Verbalized understanding. Left the unit vi wheelchair, in stable condition. Nathan [name]. RN, BSN

## 2017-05-03 NOTE — Progress Notes (Signed)
Progress Note  Patient Name: Nathan [name] Date of Encounter: 05/03/2017  Primary Cardiologist: Dr Dulce SellarMunley  Subjective   Pt denies CP or dyspnea  Inpatient Medications    Scheduled Meds: . aspirin  325 mg Oral Daily  . carvedilol  25 mg Oral BID  . clopidogrel  75 mg Oral Daily  . glipiZIDE  10 mg Oral Daily  . heparin subcutaneous  5,000 Units Subcutaneous Q8H  . insulin aspart  0-15 Units Subcutaneous TID AC & HS  . lisinopril  5 mg Oral Daily  . pantoprazole  40 mg Oral Daily  . repaglinide  0.25 mg Oral TID WC  . simvastatin  40 mg Oral QHS  . tamsulosin  0.4 mg Oral Daily   Continuous Infusions: . sodium chloride 30 mL/hr at 05/01/17 1601   PRN Meds: acetaminophen **OR** [DISCONTINUED] acetaminophen (TYLENOL) oral liquid 160 mg/5 mL **OR** [DISCONTINUED] acetaminophen, metoprolol tartrate, ondansetron (ZOFRAN) IV, senna-docusate   Vital Signs    Vitals:   05/02/17 1500 05/02/17 1930 05/02/17 2300 05/03/17 0600  BP: (!) 142/76 128/72 110/70   Pulse: 80 95 88   Resp: 20 (!) 21 (!) 23 18  Temp: 97.9 F (36.6 C) 97.8 F (36.6 C) 98 F (36.7 C) 97.9 F (36.6 C)  TempSrc: Axillary Axillary Axillary   SpO2: 100% 100% 100% 100%  Weight:    236 lb 6.4 oz (107.2 kg)  Height:        Intake/Output Summary (Last 24 hours) at 05/03/2017 0748 Last data filed at 05/03/2017 0500 Gross per 24 hour  Intake 1180 ml  Output 1100 ml  Net 80 ml   Filed Weights   05/01/17 0900 05/02/17 0354 05/03/17 0600  Weight: 254 lb 4.8 oz (115.3 kg) 250 lb 1.6 oz (113.4 kg) 236 lb 6.4 oz (107.2 kg)    Telemetry   NSR with PVCs and 3 beats NSVT- Personally Reviewed   Physical Exam   GEN: WD obese NAD Neck: No JVD, supple Cardiac: RRR, 2/6 systolic murmur apex Respiratory: Clear to auscultation bilaterally; no wheeze GI: Soft, nontender, non-distended, no masses  MS: No edema Neuro:  Nonfocal, grossly intact   Labs    Chemistry Recent Labs  Lab 04/29/17 0112   05/01/17 0950 05/02/17 0416 05/03/17 0627  NA 133*   < > 135 134* 135  K 4.4   < > 4.2 3.5 3.8  CL 103   < > 105 103 103  CO2 23   < > 25 24 24   GLUCOSE 273*   < > 182* 159* 150*  BUN 16   < > 18 14 13   CREATININE 0.89   < > 0.82 0.79 0.79  CALCIUM 8.8*   < > 8.5* 8.4* 8.9  PROT 7.2  --   --   --   --   ALBUMIN 3.7  --   --   --   --   AST 22  --   --   --   --   ALT 22  --   --   --   --   ALKPHOS 92  --   --   --   --   BILITOT 0.9  --   --   --   --   GFRNONAA >60   < > >60 >60 >60  GFRAA >60   < > >60 >60 >60  ANIONGAP 7   < > 5 7 8    < > = values in  this interval not displayed.     Hematology Recent Labs  Lab 05/01/17 0950 05/02/17 0416 05/03/17 0627  WBC 13.2* 10.8* 10.6*  RBC 3.66* 3.50* 3.64*  HGB 11.3* 10.8* 11.1*  HCT 34.5* 32.1* 33.2*  MCV 94.3 91.7 91.2  MCH 30.9 30.9 30.5  MCHC 32.8 33.6 33.4  RDW 12.9 12.5 12.6  PLT 186 181 183     Radiology    Dg Chest Port 1 View  Result Date: 05/01/2017 CLINICAL DATA:  CHF follow-up. EXAM: PORTABLE CHEST 1 VIEW COMPARISON:  April 30, 2017 FINDINGS: Stable cardiomegaly. The hila and mediastinum are normal. Mild vascular crowding in the medial right lung base. No pulmonary edema identified. IMPRESSION: Cardiomegaly.  No pulmonary edema identified.  No acute abnormality. Electronically Signed   By: Gerome Samavid  Williams III M.D   On: 05/01/2017 16:29     Patient Profile     71 year old male with past medical history of cardiomyopathy, diabetes mellitus, hypertension, mitral regurgitation admitted with CVA with newly diagnosed atrial fibrillation. Patient recently presented with left-sided weakness and received thrombolytic therapy. He had a distal M2 occlusion and underwent mechanical thrombectomy. Echocardiogram this admission shows ejection fraction 30-35% and moderate mitral regurgitation. Noted to have atrial fibrillation on telemetry and cardiology asked to evaluate.    Assessment & Plan    1 new-onset atrial  fibrillation-likely cause of CVA. Echocardiogram shows ejection fraction 30-35%. Pt remains in sinus. Continue coreg 25 mg BID. Await TSH. CHADS vasc 6. Would add apixaban when ok with neurology. At that time would DC ASA and plavix unless interventional radiology feels pt needs to continue given recent thrombectomy.  2 cardiomyopathy-we will need to review with Dr. Dulce SellarMunley. Question if patient has had a prior ischemia evaluation. This could be performed as an outpatient. Continue coreg and ACEI. Will need to reassess LV function in 3 months after meds titrated; if EF < 35 would need to consider ICD.  3 recent CVA-Managed by neurology  4 hypertension-blood pressure is controlled; continue present meds.  5 hyperlipidemia-continue statin.  6 MR-at least moderate on echo; will need fu studies in the future.  We will sign off. Please call with questions. Please arrange follow-up appointment with Dr. Dulce SellarMunley 1-2 weeks after discharge.    For questions or updates, please contact [facility] Please consult [email or web address] for contact info under Cardiology/STEMI.      Signed, Olga MillersBrian Crenshaw, MD  05/03/2017, 7:48 AM

## 2017-05-04 ENCOUNTER — Encounter (HOSPITAL_COMMUNITY): Payer: Self-pay | Admitting: Cardiology

## 2017-05-04 LAB — GLUCOSE, CAPILLARY: GLUCOSE-CAPILLARY: 278 mg/dL — AB (ref 65–99)

## 2017-05-09 DIAGNOSIS — Z09 Encounter for follow-up examination after completed treatment for conditions other than malignant neoplasm: Secondary | ICD-10-CM

## 2017-05-09 HISTORY — DX: Encounter for follow-up examination after completed treatment for conditions other than malignant neoplasm: Z09

## 2017-05-13 ENCOUNTER — Encounter: Payer: Self-pay | Admitting: *Deleted

## 2017-05-13 ENCOUNTER — Encounter: Payer: Self-pay | Admitting: Cardiology

## 2017-05-17 ENCOUNTER — Ambulatory Visit
Admission: RE | Admit: 2017-05-17 | Discharge: 2017-05-17 | Disposition: A | Discharge status: Home / Self Care | Payer: 59 | Source: Ambulatory Visit | Attending: General Surgery | Admitting: General Surgery

## 2017-05-17 DIAGNOSIS — I639 Cerebral infarction, unspecified: Secondary | ICD-10-CM

## 2017-05-17 HISTORY — PX: IR RADIOLOGIST EVAL & MGMT: IMG5224

## 2017-05-17 NOTE — Progress Notes (Signed)
Chief Complaint: I had a stroke  Referring Physician(s): Dr. Arbutus Leas  History of Present Illness: Nathan [name] is a 71 y.o. 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   He presented as a transfer from [facility] to [facility] having received a dose of IV tPA.  Baseline mRS of 0, NIHSS of 13.  He underwent emergent angiogram and treatment of right M2 branch occlusion, with TICI 3 flow restored after 1 pass Solitaire device.   He was discharged home after his admission 05/03/2017.    He tells me he has been doing well since his discharge.  He has been receiving physical therapy, with a final reassessment pending, speech therapy, and no occupational therapy.  He tells me his PT provider is very satisfied with his progress and may be dc'ing him soon.    He tells me that he is satisfied with his own rehab, though feels that although he can perform all of his usual activities without help, he is a bit slower to perform them all.  He is caring for himself, cleaning, cooking, and dressing himself.  He has some trouble with buttons.    He tells me that he feels he has some trouble with blurry vision and small print, though has been using reading glasses for years.  Denies new double vision.    He had some bruising at the right thigh, which is nearly resolved.  He denies any concerns at the right CFA puncture site.    He has his first follow up exam in January with Neurology.   His SBP is 89 today, which he feels is a bit low.  He denies any dizziness, syncope/pre-syncope, or unsteadiness.  His mentation is normal. He feels that it may just be that he needs to eat some food.  He has an appointment with his cardiologist tomorrow (Dr. Dulce Sellar).   Past Medical History:  Diagnosis Date   . Atrial fibrillation with RVR (HCC) 05/01/2017  . Cardiomyopathy (HCC) 05/01/2017  . 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 DM (diabetes mellitus) type 2, uncontrolled, with ketoacidosis (HCC) 05/01/2017  . Essential hypertension 11/26/2016  . HLD (hyperlipidemia) 05/01/2017  . IVCD (intraventricular conduction defect) 04/24/2015  . Mixed dyslipidemia 11/26/2016  . 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 IR PERCUTANEOUS ART THROMBECTOMY/INFUSION INTRACRANIAL INC DIAG ANGIO  04/29/2017  . IR US GUIDE VASC ACCESS LEFT  04/29/2017  . IR US GUIDE VASC ACCESS RIGHT  04/29/2017  . RADIOLOGY WITH ANESTHESIA N/A 04/29/2017   Procedure: IR WITH ANESTHESIA;  Surgeon: Radiologist, Medication, MD;  Location: [facility] OR;  Service: Radiology;  Laterality: N/A;    Allergies: Fentanyl; Promethazine hcl; Metformin and related; Penicillins; Sulfa antibiotics; and Foeniculum vulgare  Medications: Prior to Admission medications   Medication Sig Start Date End Date Taking? Authorizing Provider  apixaban (ELIQUIS) 5 MG TABS tablet Take 1 tablet (5 mg total) 2 (two) times daily by mouth. 05/03/17  Yes Costello, Lamar Blinks, NP  carvedilol (COREG) 25 MG tablet Take 25 mg by mouth 2 (two) times daily. 02/26/17  Yes [provider]  cyclobenzaprine (FLEXERIL) 10 MG tablet Take 10 mg by mouth 2 (two) times daily as needed. 04/11/17  Yes [provider]  glipiZIDE (GLUCOTROL  XL) 10 MG 24 hr tablet Take 10 mg 2 (two) times daily by mouth.  03/30/17  Yes [provider]  lisinopril (PRINIVIL,ZESTRIL) 5 MG tablet Take 5 mg by mouth daily. 10/12/16  Yes [provider]  meclizine (ANTIVERT) 25 MG tablet Take 25 mg by mouth daily as needed. 04/07/17  Yes [provider]  meloxicam (MOBIC) 15 MG tablet Take 15 mg by mouth daily as needed. 04/11/17  Yes [provider]  mupirocin ointment (BACTROBAN) 2 %  Apply 1 application topically daily as needed. 04/11/17  Yes [provider]  naproxen sodium (ALEVE) 220 MG tablet Take 220 mg by mouth daily as needed (pain).   Yes [provider]  repaglinide (PRANDIN) 0.5 MG tablet Take 0.5 tablets by mouth 3 (three) times daily with meals. 01/27/17  Yes [provider]  simvastatin (ZOCOR) 40 MG tablet Take 40 mg by mouth at bedtime.  01/27/17  Yes [provider]  tamsulosin (FLOMAX) 0.4 MG CAPS capsule Take 0.4 mg by mouth daily. 03/30/17  Yes [provider]  aspirin EC 81 MG tablet Take 81 mg daily by mouth.    [provider]     Family History  Problem Relation Age of Onset  . Hypertension Father   . Diabetes Father     Social History   Socioeconomic History  . Marital status: Married    Spouse name: None  . Number of children: None  . Years of education: None  . Highest education level: None  Social Needs  . Financial resource strain: None  . Food insecurity - worry: None  . Food insecurity - inability: None  . Transportation needs - medical: None  . Transportation needs - non-medical: None  Occupational History  . None  Tobacco Use  . Smoking status: Never Smoker  . Smokeless tobacco: Never Used  Substance and Sexual Activity  . Alcohol use: Yes  . Drug use: No  . Sexual activity: None  Other Topics Concern  . None  Social History Narrative  . None      Review of Systems: A 12 point ROS discussed and pertinent positives are indicated in the HPI above.  All other systems are negative.  Review of Systems  Vital Signs: BP (!) 87/54   Pulse 73   Temp 97.8 F (36.6 C) (Oral)   Resp 14   Ht 5\' 11"  (1.803 m)   Wt 220 lb (99.8 kg)   SpO2 100%   BMI 30.68 kg/m   Physical Exam Targeted exam: CN 3-12 intact.  He has slight weakness/droop at the left mouth, with slight depressed smile.   Slight weakness at the left elbow flexors/extensors, left wrist flexors/extensors,  and the left hand intrinsics (4/5).  Symmetric strength of the bilateral lower extremity.      Imaging: Ct Head Wo Contrast  Result Date: 05/03/2017 CLINICAL DATA:  Stroke EXAM: CT HEAD WITHOUT CONTRAST TECHNIQUE: Contiguous axial images were obtained from the base of the skull through the vertex without intravenous contrast. COMPARISON:  MRI head 04/30/2017 FINDINGS: Brain: Hypodensity in the right insula and frontal operculum unchanged from prior MRI compatible with recent infarct. Negative for hemorrhage. No other acute infarct identified. Mild atrophy without hydrocephalus. Negative for mass lesion. Vascular: Negative for hyperdense vessel Skull: Negative Sinuses/Orbits: Negative Other: None IMPRESSION: Acute infarct in the insula and right frontal operculum unchanged from prior MRI. No hemorrhage or other acute abnormality. Electronically Signed   By: Marlan Palau M.D.  On: 05/03/2017 11:59   Ct Head Wo Contrast  Result Date: 04/29/2017 CLINICAL DATA:  72 y/o  M; stroke follow-up. EXAM: CT HEAD WITHOUT CONTRAST TECHNIQUE: Contiguous axial images were obtained from the base of the skull through the vertex without intravenous contrast. COMPARISON:  04/29/2017 CT head and CT perfusion head. FINDINGS: Brain: Area of hypoattenuation within the right superior insula and frontal operculum measuring 2.1 x 1.9 x 2.2 cm (volume = 4.6 cm^3) compatible infarction within the region of perfusion anomaly on prior CT. No new acute intracranial hemorrhage, infarction, or significant mass effect identified. Background of mild chronic microvascular ischemic changes and parenchymal volume loss. Vascular: Mild calcific atherosclerosis of carotid siphons. No hyperdense vessel identified. Skull: Normal. Negative for fracture or focal lesion. Sinuses/Orbits: No acute finding. Other: None. IMPRESSION: 1. Evolving infarction and right superior insula and frontal operculum measuring 4.6 cc within the region of perfusion  anomaly on prior CT perfusion. 2. No new large territory acute infarction, hemorrhage, or mass effect. 3. Mild chronic microvascular ischemic changes and parenchymal volume loss of the brain. Electronically Signed   By: Mitzi Hansen M.D.   On: 04/29/2017 19:05   Ct Head Wo Contrast  Result Date: 04/29/2017 CLINICAL DATA:  Right MCA stroke with percutaneous intervention. EXAM: CT HEAD WITHOUT CONTRAST TECHNIQUE: Contiguous axial images were obtained from the base of the skull through the vertex without intravenous contrast. COMPARISON:  Cerebral angiogram 04/29/2017 Head CT 04/29/2017 FINDINGS: Brain: There is no intracranial hemorrhage. Gray-white differentiation is preserved. Basal ganglia and insular ribbons are preserved. Vascular: No hyperdense vessel or unexpected calcification.There is mild intravascular enhancement due to presence of circulating contrast. Skull: Normal visualized skull base, calvarium and extracranial soft tissues. Sinuses/Orbits: No sinus fluid levels or advanced mucosal thickening. No mastoid effusion. Normal orbits. IMPRESSION: No intracranial hemorrhage or cytotoxic edema following vascular intervention. Electronically Signed   By: Deatra Robinson M.D.   On: 04/29/2017 04:05   Nathan [name] Head Wo Contrast  Result Date: 04/30/2017 CLINICAL DATA:  Follow-up acute RIGHT MCA stroke and mechanical thrombectomy of MCA occlusion. EXAM: MRI HEAD WITHOUT CONTRAST MRA HEAD WITHOUT CONTRAST TECHNIQUE: Multiplanar, multiecho pulse sequences of the brain and surrounding structures were obtained without intravenous contrast. Angiographic images of the head were obtained using MRA technique without contrast. COMPARISON:  CT HEAD April 29, 2017 and CT angiogram of the head April 28, 2017 FINDINGS: MRI HEAD FINDINGS- mildly motion degraded examination. BRAIN: Patchy RIGHT frontal reduced diffusion, 1.5 x 2 cm confluent component RIGHT insula with low ADC values and susceptibility artifact  within the insular component. LEFT frontal lobe chronic microhemorrhage. Patchy supratentorial white matter FLAIR T2 hyperintensities exclusive of aforementioned abnormality. Ventricles and sulci are normal for patient's age. No midline shift, mass effect or masses. No abnormal extra-axial fluid collections. VASCULAR: See below. SKULL AND UPPER CERVICAL SPINE: No abnormal sellar expansion. No suspicious calvarial bone marrow signal. Craniocervical junction maintained. SINUSES/ORBITS: The mastoid air-cells and included paranasal sinuses are well-aerated. The included ocular globes and orbital contents are non-suspicious. OTHER: None. MRA HEAD FINDINGS- moderately motion degraded examination. ANTERIOR CIRCULATION: Normal flow related enhancement of the included cervical, petrous, cavernous and supraclinoid internal carotid arteries. Patent anterior communicating artery. Patent anterior and middle cerebral arteries, including distal segments. Focal stenosis versus motion artifact RIGHT M2 origin seen on maximum intensity projected reformations. No large vessel occlusion, flow limiting stenosis. POSTERIOR CIRCULATION: LEFT vertebral artery is dominant. Basilar artery is patent, with normal flow related enhancement of the main branch  vessels. Patent posterior cerebral arteries. Bilateral posterior communicating artery's present. No large vessel occlusion, flow limiting stenosis. ANATOMIC VARIANTS: None. Source images and MIP images were reviewed. IMPRESSION: MRI HEAD: 1. Mildly motion degraded examination. 2. Acute patchy RIGHT frontal lobe infarct, with petechial hemorrhage within insular component. 3. Mild to moderate chronic small vessel ischemic disease. MRA HEAD: 1. Moderately motion degraded examination. 2. No emergent large vessel occlusion or flow-limiting stenosis. 3. Focal stenosis versus motion artifact RIGHT M2 origin. Electronically Signed   By: Awilda Metro M.D.   On: 04/30/2017 04:32   Nathan Brain Wo  Contrast  Result Date: 04/30/2017 CLINICAL DATA:  Follow-up acute RIGHT MCA stroke and mechanical thrombectomy of MCA occlusion. EXAM: MRI HEAD WITHOUT CONTRAST MRA HEAD WITHOUT CONTRAST TECHNIQUE: Multiplanar, multiecho pulse sequences of the brain and surrounding structures were obtained without intravenous contrast. Angiographic images of the head were obtained using MRA technique without contrast. COMPARISON:  CT HEAD April 29, 2017 and CT angiogram of the head April 28, 2017 FINDINGS: MRI HEAD FINDINGS- mildly motion degraded examination. BRAIN: Patchy RIGHT frontal reduced diffusion, 1.5 x 2 cm confluent component RIGHT insula with low ADC values and susceptibility artifact within the insular component. LEFT frontal lobe chronic microhemorrhage. Patchy supratentorial white matter FLAIR T2 hyperintensities exclusive of aforementioned abnormality. Ventricles and sulci are normal for patient's age. No midline shift, mass effect or masses. No abnormal extra-axial fluid collections. VASCULAR: See below. SKULL AND UPPER CERVICAL SPINE: No abnormal sellar expansion. No suspicious calvarial bone marrow signal. Craniocervical junction maintained. SINUSES/ORBITS: The mastoid air-cells and included paranasal sinuses are well-aerated. The included ocular globes and orbital contents are non-suspicious. OTHER: None. MRA HEAD FINDINGS- moderately motion degraded examination. ANTERIOR CIRCULATION: Normal flow related enhancement of the included cervical, petrous, cavernous and supraclinoid internal carotid arteries. Patent anterior communicating artery. Patent anterior and middle cerebral arteries, including distal segments. Focal stenosis versus motion artifact RIGHT M2 origin seen on maximum intensity projected reformations. No large vessel occlusion, flow limiting stenosis. POSTERIOR CIRCULATION: LEFT vertebral artery is dominant. Basilar artery is patent, with normal flow related enhancement of the main branch  vessels. Patent posterior cerebral arteries. Bilateral posterior communicating artery's present. No large vessel occlusion, flow limiting stenosis. ANATOMIC VARIANTS: None. Source images and MIP images were reviewed. IMPRESSION: MRI HEAD: 1. Mildly motion degraded examination. 2. Acute patchy RIGHT frontal lobe infarct, with petechial hemorrhage within insular component. 3. Mild to moderate chronic small vessel ischemic disease. MRA HEAD: 1. Moderately motion degraded examination. 2. No emergent large vessel occlusion or flow-limiting stenosis. 3. Focal stenosis versus motion artifact RIGHT M2 origin. Electronically Signed   By: Awilda Metro M.D.   On: 04/30/2017 04:32   Ir US Guide Vasc Access Left  Result Date: 04/29/2017 INDICATION: 71 year old male with a history of acute right MCA stroke, left-sided symptoms, normal baseline, with stroke scale of 13, aspects 10, within the window for tPA. He presents for angiogram and mechanical thrombectomy as a treatment for emergent large vessel occlusion. EXAM: ULTRASOUND GUIDED ACCESS RIGHT COMMON FEMORAL ARTERY CEREBRAL ANGIOGRAM MECHANICAL THROMBECTOMY RIGHT MCA BRANCHES CLOSURE OF RIGHT COMMON FEMORAL ARTERY ACCESS WITH SUTURE MEDIATED DEVICE COMPARISON:  CT IMAGING OF THE SAME DAY MEDICATIONS: 2.0 g Ancef. The antibiotic was administered within 1 hour of the procedure ANESTHESIA/SEDATION: General anesthesia with anesthesia team CONTRAST:  75 cc Isovue 300 FLUOROSCOPY TIME:  Fluoroscopy Time: 8 minutes 24 seconds (707 mGy). COMPLICATIONS: None TECHNIQUE: Informed written consent was obtained from the patient's family after a thorough  discussion of the procedural risks, benefits and alternatives. Specific risks discussed include: Bleeding, infection, contrast reaction, kidney injury/failure, need for further procedure/surgery, arterial injury or dissection, embolization to new territory, intracranial hemorrhage (10-15% risk), neurologic deterioration,  cardiopulmonary collapse, death. All questions were addressed. Maximal Sterile Barrier Technique was utilized including during the procedure including caps, mask, sterile gowns, sterile gloves, sterile drape, hand hygiene and skin antiseptic. A timeout was performed prior to the initiation of the procedure. FINDINGS: Initial: Right common carotid artery:  Normal course caliber and contour. Right external carotid artery: Patent with antegrade flow. Right internal carotid artery: Mild atherosclerotic changes with plaque at the carotid bifurcation. No significant stenosis at the bifurcation. Normal course caliber and contour of the cervical portion. Vertical and petrous segment patent with normal course caliber contour. Cavernous segment patent. Clinoid segment patent. Antegrade flow of the ophthalmic artery. Ophthalmic segment patent. Terminus patent. Right MCA: M1 segment is patent. There is an early arising temporal branch, with downward course. Inferior division patent on the initial image. There is proximal occlusion of the superior and dominant segment. Right ACA: A 1 segment patent. A 2 segment perfuses the right territory. Final: Right MCA: Restoration of flow through the superior and dominant division, TICI 3 PROCEDURE: Patient was brought to the interventional suite, and identification of the patient was confirmed. Patient was placed supine position with gentle anesthesia initiated with the anesthesia team. The patient is then prepped and draped in the usual sterile fashion. Ultrasound survey of the right inguinal region was performed with images stored and sent to PACs. A micropuncture needle was used access the right common femoral artery under ultrasound. With excellent arterial blood flow returned, and an .018 micro wire was passed through the needle, observed enter the abdominal aorta under fluoroscopy. The needle was removed, and a micropuncture sheath was placed over the wire. The inner dilator and  wire were removed, and an 035 Bentson wire was advanced under fluoroscopy into the abdominal aorta. The sheath was removed and a standard 5 [place] vascular sheath was placed. The dilator was removed and the sheath was flushed. Ultrasound survey of the left inguinal region was then performed with images stored and sent to PACs. A micropuncture needle was used access the left common femoral artery under ultrasound. With excellent arterial blood flow returned, an .018 micro wire was passed through the needle, observed to enter the abdominal aorta under fluoroscopy. The needle was removed, and a micropuncture sheath was placed over the wire. The inner dilator and wire were removed, and an 035 Bentson wire was advanced under fluoroscopy into the abdominal aorta. The sheath was removed and a standard 4 [place] vascular sheath was placed for arterial monitoring. Dilator was removed and the sheath was flushed and attached to the transducer. A 79F JB-1 diagnostic catheter was advanced over the wire to the proximal descending thoracic aorta. Wire was then removed. Double flush of the catheter was performed. Catheter was then used to select the innominate artery. Angiogram was performed. Using roadmap technique, the catheter was advanced over a roadrunner wire into right common carotid artery. Formal angiogram was performed. Exchange length Rosen wire was then passed through the diagnostic catheter to the distal common carotid artery and the diagnostic catheter was removed. The 5 French sheath was removed and exchanged for 8 French 55 centimeter BrightTip sheath. Sheath was flushed and attached to pressurized and heparinized saline bag for constant forward flow. Then an 8 [place], 85 cm Flowgate balloon tip catheter was  prepared on the back table with inflation of the balloon with 50/50 concentration of dilute contrast. The balloon catheter was then advanced over the wire, positioned into the distal common carotid artery. Copious  back flush was performed and the balloon catheter was attached to heparinized and pressurized saline bag for forward flow. Flowgate balloon catheter was then advanced over the road runner wire into the mid internal carotid artery. Angiogram was performed for road map guide. Microcatheter system was then introduced through the balloon guide catheter, using a synchro soft 014 wire and a Trevo Provue18 catheter. Microcatheter system was advanced into the internal carotid artery, to the level of the occlusion. The micro wire was then carefully advanced through the occluded segment. Microcatheter was then push through the occluded segment and the wire was removed. Blood was then aspirated through the hub of the microcatheter, and a gentle contrast injection was performed confirming intraluminal position. A rotating hemostatic valve was then attached to the back end of the microcatheter, and a pressurized and heparinized saline bag was attached to the catheter. 4 x 40 solitaire device was then selected. Back flush was achieved at the rotating hemostatic valve, and then the device was gently advanced through the microcatheter to the distal end. The retriever was then unsheathed by withdrawing the microcatheter under fluoroscopy. Once the retriever was completely unsheathed, control angiogram was performed from the balloon catheter. The balloon at the balloon tip catheter was then inflated under fluoroscopy for proximal flow arrest. Constant aspiration was then performed at the tip of the balloon guide catheter as the retriever was gently and slowly withdrawn with fluoroscopic observation. Once the retriever was entirely removed from the system, free aspiration was confirmed at the hub of the balloon guide catheter, with free blood return confirmed. The balloon was then deflated, rotating hemostatic valve was reattached, and a control angiogram was performed. Control angiogram was performed at the right common femoral  artery puncture site. Two overlapping suture mediated devices were used for right common femoral artery closure. Patient tolerated the procedure well and remained hemodynamically stable throughout. No complications were encountered and no significant blood loss encountered. IMPRESSION: Status post cerebral angiogram and mechanical thrombectomy of proximal occlusion of the superior and dominant MCA branch of the right hemisphere with restoration of TICI 3 flow. Suture mediated closure of right common femoral artery. Signed, Yvone Neu. Loreta Ave, DO [facility] Radiology PLAN: Extubated in the interventional suite CT post procedure ICU admission Right leg straight until 8 a.m. Electronically Signed   By: Gilmer Mor D.O.   On: 04/29/2017 04:07   Ir US Guide Vasc Access Right  Result Date: 04/29/2017 INDICATION: 71 year old male with a history of acute right MCA stroke, left-sided symptoms, normal baseline, with stroke scale of 13, aspects 10, within the window for tPA. He presents for angiogram and mechanical thrombectomy as a treatment for emergent large vessel occlusion. EXAM: ULTRASOUND GUIDED ACCESS RIGHT COMMON FEMORAL ARTERY CEREBRAL ANGIOGRAM MECHANICAL THROMBECTOMY RIGHT MCA BRANCHES CLOSURE OF RIGHT COMMON FEMORAL ARTERY ACCESS WITH SUTURE MEDIATED DEVICE COMPARISON:  CT IMAGING OF THE SAME DAY MEDICATIONS: 2.0 g Ancef. The antibiotic was administered within 1 hour of the procedure ANESTHESIA/SEDATION: General anesthesia with anesthesia team CONTRAST:  75 cc Isovue 300 FLUOROSCOPY TIME:  Fluoroscopy Time: 8 minutes 24 seconds (707 mGy). COMPLICATIONS: None TECHNIQUE: Informed written consent was obtained from the patient's family after a thorough discussion of the procedural risks, benefits and alternatives. Specific risks discussed include: Bleeding, infection,  contrast reaction, kidney injury/failure, need for further procedure/surgery, arterial injury or  dissection, embolization to new territory, intracranial hemorrhage (10-15% risk), neurologic deterioration, cardiopulmonary collapse, death. All questions were addressed. Maximal Sterile Barrier Technique was utilized including during the procedure including caps, mask, sterile gowns, sterile gloves, sterile drape, hand hygiene and skin antiseptic. A timeout was performed prior to the initiation of the procedure. FINDINGS: Initial: Right common carotid artery:  Normal course caliber and contour. Right external carotid artery: Patent with antegrade flow. Right internal carotid artery: Mild atherosclerotic changes with plaque at the carotid bifurcation. No significant stenosis at the bifurcation. Normal course caliber and contour of the cervical portion. Vertical and petrous segment patent with normal course caliber contour. Cavernous segment patent. Clinoid segment patent. Antegrade flow of the ophthalmic artery. Ophthalmic segment patent. Terminus patent. Right MCA: M1 segment is patent. There is an early arising temporal branch, with downward course. Inferior division patent on the initial image. There is proximal occlusion of the superior and dominant segment. Right ACA: A 1 segment patent. A 2 segment perfuses the right territory. Final: Right MCA: Restoration of flow through the superior and dominant division, TICI 3 PROCEDURE: Patient was brought to the interventional suite, and identification of the patient was confirmed. Patient was placed supine position with gentle anesthesia initiated with the anesthesia team. The patient is then prepped and draped in the usual sterile fashion. Ultrasound survey of the right inguinal region was performed with images stored and sent to PACs. A micropuncture needle was used access the right common femoral artery under ultrasound. With excellent arterial blood flow returned, and an .018 micro wire was passed through the needle, observed enter the abdominal aorta under  fluoroscopy. The needle was removed, and a micropuncture sheath was placed over the wire. The inner dilator and wire were removed, and an 035 Bentson wire was advanced under fluoroscopy into the abdominal aorta. The sheath was removed and a standard 5 [place] vascular sheath was placed. The dilator was removed and the sheath was flushed. Ultrasound survey of the left inguinal region was then performed with images stored and sent to PACs. A micropuncture needle was used access the left common femoral artery under ultrasound. With excellent arterial blood flow returned, an .018 micro wire was passed through the needle, observed to enter the abdominal aorta under fluoroscopy. The needle was removed, and a micropuncture sheath was placed over the wire. The inner dilator and wire were removed, and an 035 Bentson wire was advanced under fluoroscopy into the abdominal aorta. The sheath was removed and a standard 4 [place] vascular sheath was placed for arterial monitoring. Dilator was removed and the sheath was flushed and attached to the transducer. A 56F JB-1 diagnostic catheter was advanced over the wire to the proximal descending thoracic aorta. Wire was then removed. Double flush of the catheter was performed. Catheter was then used to select the innominate artery. Angiogram was performed. Using roadmap technique, the catheter was advanced over a roadrunner wire into right common carotid artery. Formal angiogram was performed. Exchange length Rosen wire was then passed through the diagnostic catheter to the distal common carotid artery and the diagnostic catheter was removed. The 5 French sheath was removed and exchanged for 8 French 55 centimeter BrightTip sheath. Sheath was flushed and attached to pressurized and heparinized saline bag for constant forward flow. Then an 8 [place], 85 cm Flowgate balloon tip catheter was prepared on the back table with inflation of the balloon with 50/50 concentration of  dilute contrast.  The balloon catheter was then advanced over the wire, positioned into the distal common carotid artery. Copious back flush was performed and the balloon catheter was attached to heparinized and pressurized saline bag for forward flow. Flowgate balloon catheter was then advanced over the road runner wire into the mid internal carotid artery. Angiogram was performed for road map guide. Microcatheter system was then introduced through the balloon guide catheter, using a synchro soft 014 wire and a Trevo Provue18 catheter. Microcatheter system was advanced into the internal carotid artery, to the level of the occlusion. The micro wire was then carefully advanced through the occluded segment. Microcatheter was then push through the occluded segment and the wire was removed. Blood was then aspirated through the hub of the microcatheter, and a gentle contrast injection was performed confirming intraluminal position. A rotating hemostatic valve was then attached to the back end of the microcatheter, and a pressurized and heparinized saline bag was attached to the catheter. 4 x 40 solitaire device was then selected. Back flush was achieved at the rotating hemostatic valve, and then the device was gently advanced through the microcatheter to the distal end. The retriever was then unsheathed by withdrawing the microcatheter under fluoroscopy. Once the retriever was completely unsheathed, control angiogram was performed from the balloon catheter. The balloon at the balloon tip catheter was then inflated under fluoroscopy for proximal flow arrest. Constant aspiration was then performed at the tip of the balloon guide catheter as the retriever was gently and slowly withdrawn with fluoroscopic observation. Once the retriever was entirely removed from the system, free aspiration was confirmed at the hub of the balloon guide catheter, with free blood return confirmed. The balloon was then deflated, rotating hemostatic valve was  reattached, and a control angiogram was performed. Control angiogram was performed at the right common femoral artery puncture site. Two overlapping suture mediated devices were used for right common femoral artery closure. Patient tolerated the procedure well and remained hemodynamically stable throughout. No complications were encountered and no significant blood loss encountered. IMPRESSION: Status post cerebral angiogram and mechanical thrombectomy of proximal occlusion of the superior and dominant MCA branch of the right hemisphere with restoration of TICI 3 flow. Suture mediated closure of right common femoral artery. Signed, Yvone Neu. Loreta Ave, DO [facility] Radiology PLAN: Extubated in the interventional suite CT post procedure ICU admission Right leg straight until 8 a.m. Electronically Signed   By: Gilmer Mor D.O.   On: 04/29/2017 04:07   Ct Cerebral Perfusion W Contrast  Result Date: 04/29/2017 CLINICAL DATA:  Code stroke. EXAM: CT HEAD WITHOUT CONTRAST CT CEREBRAL PERFUSION WITH CONTRAST TECHNIQUE: Contiguous axial images were obtained from the base of the skull through the vertex without intravenous contrast. Following the administration of contrast material, CT perfusion scanning of the brain was performed. COMPARISON:  None. FINDINGS: Brain: No mass lesion or acute hemorrhage. No focal hypoattenuation of the basal ganglia or cortex to indicate infarcted tissue. There is periventricular hypoattenuation compatible with chronic microvascular disease. No hydrocephalus or age advanced atrophy. Vascular: No hyperdense vessel. No advanced atherosclerotic calcification of the arteries at the skull base. Skull: Normal visualized skull base, calvarium and extracranial soft tissues. Sinuses/Orbits: No sinus fluid levels or advanced mucosal thickening. No mastoid effusion. Normal orbits. ASPECTS [facility] Stroke Program Early CT Score) - Ganglionic level  infarction (caudate, lentiform nuclei, internal capsule, insula, M1-M3 cortex): 7 - Supraganglionic infarction (M4-M6 cortex): 3 Total score (0-10 with 10  being normal): 10 CT Brain Perfusion Findings: CBF (<30%) Volume: 0mL Perfusion (Tmax>6.0s) volume: 63mL Mismatch Volume: 63mL Ischemia Location:Anterior right MCA distribution IMPRESSION: 1. No acute hemorrhage or mass lesion. 2. ASPECTS is 10. 3. Elevated mean transit time within the anterior right MCA distribution without associated abnormal cerebral blood volume or cerebral blood flow likely indicates an area of acute ischemia without infarction. These results were called by telephone at the time of interpretation on 04/29/2017 at 12:55 am to Dr. Ritta SlotMCNEILL KIRKPATRICK , who verbally acknowledged these results. Electronically Signed   By: Deatra RobinsonKevin  Herman M.D.   On: 04/29/2017 01:16   Dg Chest Port 1 View  Result Date: 05/01/2017 CLINICAL DATA:  CHF follow-up. EXAM: PORTABLE CHEST 1 VIEW COMPARISON:  April 30, 2017 FINDINGS: Stable cardiomegaly. The hila and mediastinum are normal. Mild vascular crowding in the medial right lung base. No pulmonary edema identified. IMPRESSION: Cardiomegaly.  No pulmonary edema identified.  No acute abnormality. Electronically Signed   By: Gerome Samavid  Williams III M.D   On: 05/01/2017 16:29   Dg Chest Port 1 View  Result Date: 04/30/2017 CLINICAL DATA:  Intracranial hemorrhage. EXAM: PORTABLE CHEST 1 VIEW COMPARISON:  Yesterday. FINDINGS: Normal sized heart. Clear lungs with decreased prominence of the pulmonary vasculature. Improved inspiration. Stable enlarged cardiac silhouette. Unremarkable bones. IMPRESSION: Stable cardiomegaly with improved pulmonary vascular congestion, at least partly due to an improved inspiration. Electronically Signed   By: Beckie SaltsSteven  Reid M.D.   On: 04/30/2017 08:00   Dg Chest Port 1 View  Result Date: 04/29/2017 CLINICAL DATA:  Acute respiratory failure.  Stroke. EXAM: PORTABLE CHEST 1 VIEW  COMPARISON:  [facility] FINDINGS: Cardiomegaly accentuated by lower lung volumes. Crowding of bronchovascular structures with mild vascular congestion. Minimal retrocardiac atelectasis. No pleural fluid or pneumothorax. IMPRESSION: Cardiomegaly with mild vascular congestion. Electronically Signed   By: Rubye OaksMelanie  Ehinger M.D.   On: 04/29/2017 05:32   Ir Percutaneous Art Thrombectomy/infusion Intracranial Inc Diag Angio  Result Date: 04/29/2017 INDICATION: 71 year old male with a history of acute right MCA stroke, left-sided symptoms, normal baseline, with stroke scale of 13, aspects 10, within the window for tPA. He presents for angiogram and mechanical thrombectomy as a treatment for emergent large vessel occlusion. EXAM: ULTRASOUND GUIDED ACCESS RIGHT COMMON FEMORAL ARTERY CEREBRAL ANGIOGRAM MECHANICAL THROMBECTOMY RIGHT MCA BRANCHES CLOSURE OF RIGHT COMMON FEMORAL ARTERY ACCESS WITH SUTURE MEDIATED DEVICE COMPARISON:  CT IMAGING OF THE SAME DAY MEDICATIONS: 2.0 g Ancef. The antibiotic was administered within 1 hour of the procedure ANESTHESIA/SEDATION: General anesthesia with anesthesia team CONTRAST:  75 cc Isovue 300 FLUOROSCOPY TIME:  Fluoroscopy Time: 8 minutes 24 seconds (707 mGy). COMPLICATIONS: None TECHNIQUE: Informed written consent was obtained from the patient's family after a thorough discussion of the procedural risks, benefits and alternatives. Specific risks discussed include: Bleeding, infection, contrast reaction, kidney injury/failure, need for further procedure/surgery, arterial injury or dissection, embolization to new territory, intracranial hemorrhage (10-15% risk), neurologic deterioration, cardiopulmonary collapse, death. All questions were addressed. Maximal Sterile Barrier Technique was utilized including during the procedure including caps, mask, sterile gowns, sterile gloves, sterile drape, hand hygiene and skin antiseptic. A timeout was  performed prior to the initiation of the procedure. FINDINGS: Initial: Right common carotid artery:  Normal course caliber and contour. Right external carotid artery: Patent with antegrade flow. Right internal carotid artery: Mild atherosclerotic changes with plaque at the carotid bifurcation. No significant stenosis at the bifurcation. Normal course caliber and contour of the cervical portion. Vertical  and petrous segment patent with normal course caliber contour. Cavernous segment patent. Clinoid segment patent. Antegrade flow of the ophthalmic artery. Ophthalmic segment patent. Terminus patent. Right MCA: M1 segment is patent. There is an early arising temporal branch, with downward course. Inferior division patent on the initial image. There is proximal occlusion of the superior and dominant segment. Right ACA: A 1 segment patent. A 2 segment perfuses the right territory. Final: Right MCA: Restoration of flow through the superior and dominant division, TICI 3 PROCEDURE: Patient was brought to the interventional suite, and identification of the patient was confirmed. Patient was placed supine position with gentle anesthesia initiated with the anesthesia team. The patient is then prepped and draped in the usual sterile fashion. Ultrasound survey of the right inguinal region was performed with images stored and sent to PACs. A micropuncture needle was used access the right common femoral artery under ultrasound. With excellent arterial blood flow returned, and an .018 micro wire was passed through the needle, observed enter the abdominal aorta under fluoroscopy. The needle was removed, and a micropuncture sheath was placed over the wire. The inner dilator and wire were removed, and an 035 Bentson wire was advanced under fluoroscopy into the abdominal aorta. The sheath was removed and a standard 5 [place] vascular sheath was placed. The dilator was removed and the sheath was flushed. Ultrasound survey of the left  inguinal region was then performed with images stored and sent to PACs. A micropuncture needle was used access the left common femoral artery under ultrasound. With excellent arterial blood flow returned, an .018 micro wire was passed through the needle, observed to enter the abdominal aorta under fluoroscopy. The needle was removed, and a micropuncture sheath was placed over the wire. The inner dilator and wire were removed, and an 035 Bentson wire was advanced under fluoroscopy into the abdominal aorta. The sheath was removed and a standard 4 [place] vascular sheath was placed for arterial monitoring. Dilator was removed and the sheath was flushed and attached to the transducer. A 54F JB-1 diagnostic catheter was advanced over the wire to the proximal descending thoracic aorta. Wire was then removed. Double flush of the catheter was performed. Catheter was then used to select the innominate artery. Angiogram was performed. Using roadmap technique, the catheter was advanced over a roadrunner wire into right common carotid artery. Formal angiogram was performed. Exchange length Rosen wire was then passed through the diagnostic catheter to the distal common carotid artery and the diagnostic catheter was removed. The 5 French sheath was removed and exchanged for 8 French 55 centimeter BrightTip sheath. Sheath was flushed and attached to pressurized and heparinized saline bag for constant forward flow. Then an 8 [place], 85 cm Flowgate balloon tip catheter was prepared on the back table with inflation of the balloon with 50/50 concentration of dilute contrast. The balloon catheter was then advanced over the wire, positioned into the distal common carotid artery. Copious back flush was performed and the balloon catheter was attached to heparinized and pressurized saline bag for forward flow. Flowgate balloon catheter was then advanced over the road runner wire into the mid internal carotid artery. Angiogram was performed  for road map guide. Microcatheter system was then introduced through the balloon guide catheter, using a synchro soft 014 wire and a Trevo Provue18 catheter. Microcatheter system was advanced into the internal carotid artery, to the level of the occlusion. The micro wire was then carefully advanced through the occluded segment. Microcatheter was then push  through the occluded segment and the wire was removed. Blood was then aspirated through the hub of the microcatheter, and a gentle contrast injection was performed confirming intraluminal position. A rotating hemostatic valve was then attached to the back end of the microcatheter, and a pressurized and heparinized saline bag was attached to the catheter. 4 x 40 solitaire device was then selected. Back flush was achieved at the rotating hemostatic valve, and then the device was gently advanced through the microcatheter to the distal end. The retriever was then unsheathed by withdrawing the microcatheter under fluoroscopy. Once the retriever was completely unsheathed, control angiogram was performed from the balloon catheter. The balloon at the balloon tip catheter was then inflated under fluoroscopy for proximal flow arrest. Constant aspiration was then performed at the tip of the balloon guide catheter as the retriever was gently and slowly withdrawn with fluoroscopic observation. Once the retriever was entirely removed from the system, free aspiration was confirmed at the hub of the balloon guide catheter, with free blood return confirmed. The balloon was then deflated, rotating hemostatic valve was reattached, and a control angiogram was performed. Control angiogram was performed at the right common femoral artery puncture site. Two overlapping suture mediated devices were used for right common femoral artery closure. Patient tolerated the procedure well and remained hemodynamically stable throughout. No complications were encountered and no significant blood  loss encountered. IMPRESSION: Status post cerebral angiogram and mechanical thrombectomy of proximal occlusion of the superior and dominant MCA branch of the right hemisphere with restoration of TICI 3 flow. Suture mediated closure of right common femoral artery. Signed, Yvone NeuJaime S. Loreta AveWagner, DO [facility] PLAN: Extubated in the interventional suite CT post procedure ICU admission Right leg straight until 8 a.m. Electronically Signed   By: Gilmer MorJaime  Kimarion Chery D.O.   On: 04/29/2017 04:07   Ct Head Code Stroke Wo Contrast  Result Date: 04/29/2017 CLINICAL DATA:  Code stroke. EXAM: CT HEAD WITHOUT CONTRAST CT CEREBRAL PERFUSION WITH CONTRAST TECHNIQUE: Contiguous axial images were obtained from the base of the skull through the vertex without intravenous contrast. Following the administration of contrast material, CT perfusion scanning of the brain was performed. COMPARISON:  None. FINDINGS: Brain: No mass lesion or acute hemorrhage. No focal hypoattenuation of the basal ganglia or cortex to indicate infarcted tissue. There is periventricular hypoattenuation compatible with chronic microvascular disease. No hydrocephalus or age advanced atrophy. Vascular: No hyperdense vessel. No advanced atherosclerotic calcification of the arteries at the skull base. Skull: Normal visualized skull base, calvarium and extracranial soft tissues. Sinuses/Orbits: No sinus fluid levels or advanced mucosal thickening. No mastoid effusion. Normal orbits. ASPECTS ([place] Stroke Program Early CT Score) - Ganglionic level infarction (caudate, lentiform nuclei, internal capsule, insula, M1-M3 cortex): 7 - Supraganglionic infarction (M4-M6 cortex): 3 Total score (0-10 with 10 being normal): 10 CT Brain Perfusion Findings: CBF (<30%) Volume: 0mL Perfusion (Tmax>6.0s) volume: 63mL Mismatch Volume: 63mL Ischemia Location:Anterior right MCA distribution IMPRESSION: 1. No acute hemorrhage or mass  lesion. 2. ASPECTS is 10. 3. Elevated mean transit time within the anterior right MCA distribution without associated abnormal cerebral blood volume or cerebral blood flow likely indicates an area of acute ischemia without infarction. These results were called by telephone at the time of interpretation on 04/29/2017 at 12:55 am to Dr. Ritta SlotMCNEILL KIRKPATRICK , who verbally acknowledged these results. Electronically Signed   By: Deatra RobinsonKevin  Herman M.D.   On: 04/29/2017 01:16    Labs:  CBC: Recent Labs  04/30/17 0428 05/01/17 0950 05/02/17 0416 05/03/17 0627  WBC 21.6* 13.2* 10.8* 10.6*  HGB 11.1* 11.3* 10.8* 11.1*  HCT 32.3* 34.5* 32.1* 33.2*  PLT 184 186 181 183    COAGS: No results for input(s): INR, APTT in the last 8760 hours.  BMP: Recent Labs    04/30/17 0428 05/01/17 0950 05/02/17 0416 05/03/17 0627  NA 136 135 134* 135  K 4.0 4.2 3.5 3.8  CL 106 105 103 103  CO2 24 25 24 24   GLUCOSE 165* 182* 159* 150*  BUN 19 18 14 13   CALCIUM 8.8* 8.5* 8.4* 8.9  CREATININE 0.81 0.82 0.79 0.79  GFRNONAA >60 >60 >60 >60  GFRAA >60 >60 >60 >60    LIVER FUNCTION TESTS: Recent Labs    04/29/17 0112  BILITOT 0.9  AST 22  ALT 22  ALKPHOS 92  PROT 7.2  ALBUMIN 3.7    TUMOR MARKERS: No results for input(s): AFPTM, CEA, CA199, CHROMGRNA in the last 8760 hours.  Assessment and Plan:  Nathan [name] is a 71 yo male SP cerebral angiogram and emergent mechanical thrombectomy of right M2 branch occlusion performed 04/29/2017.   He has been recovering well, and has his first follow up with Neurology in January.    I discussed with him the importance of cardiovascular risk factors, and maximal medical management.  I also encouraged him to observe all of his follow ups with his physicians, specifically Neurology and his own PCP.   Regarding his SBP, he will be following up with his cardiologist tomorrow and his own PCP, which I think is reasonable, as he is asymptomatic at this time.    He  voiced his understanding with the plan of care.    Electronically Signed: Gilmer Mor 05/17/2017, 12:18 PM   I spent a total of    25 Minutes in face to face in clinical consultation, greater than 50% of which was counseling/coordinating care for acute stroke, right MCA, status post mechanical thrombectomy.

## 2017-05-18 ENCOUNTER — Encounter: Payer: Self-pay | Admitting: Cardiology

## 2017-05-18 ENCOUNTER — Ambulatory Visit (INDEPENDENT_AMBULATORY_CARE_PROVIDER_SITE_OTHER): Payer: Medicare Other | Admitting: Cardiology

## 2017-05-18 VITALS — BP 84/62 | HR 71 | Ht 71.0 in | Wt 227.0 lb

## 2017-05-18 DIAGNOSIS — I447 Left bundle-branch block, unspecified: Secondary | ICD-10-CM | POA: Insufficient documentation

## 2017-05-18 DIAGNOSIS — Z7901 Long term (current) use of anticoagulants: Secondary | ICD-10-CM | POA: Diagnosis not present

## 2017-05-18 DIAGNOSIS — I110 Hypertensive heart disease with heart failure: Secondary | ICD-10-CM | POA: Diagnosis not present

## 2017-05-18 DIAGNOSIS — I5022 Chronic systolic (congestive) heart failure: Secondary | ICD-10-CM

## 2017-05-18 DIAGNOSIS — I4891 Unspecified atrial fibrillation: Secondary | ICD-10-CM | POA: Diagnosis not present

## 2017-05-18 HISTORY — DX: Left bundle-branch block, unspecified: I44.7

## 2017-05-18 HISTORY — DX: Long term (current) use of anticoagulants: Z79.01

## 2017-05-18 MED ORDER — CARVEDILOL 25 MG PO TABS: 12.5000 mg | ORAL_TABLET | Freq: Two times a day (BID) | ORAL | 3 refills | Status: DC

## 2017-05-18 MED ORDER — LISINOPRIL 5 MG PO TABS: 2.5000 mg | ORAL_TABLET | Freq: Every day | ORAL | 3 refills | Status: DC

## 2017-05-18 NOTE — Progress Notes (Signed)
Cardiology Office Note:    Date:  05/18/2017   ID:  Nathan [name], DOB 08-Sep-1945, MRN [id number]  PCP:  [name], MD  Cardiologist:  Norman Herrlich, MD    Referring MD: [name], MD    ASSESSMENT:    1. Chronic systolic heart failure (HCC)   2. Atrial fibrillation with RVR (HCC)   3. Chronic anticoagulation   4. Hypertensive heart disease with heart failure (HCC)   5. LBBB (left bundle branch block)    PLAN:    In order of problems listed above:  1. Stable compensated.  He is on guideline directed therapy including beta-blocker and ACE inhibitor.  I am somewhat unsure of the mechanism of his hypotension he will take a 2 dose break from both these medications were resumed at a 50% reduction and will have a CBC drawn today to be sure that he is not anemic with institution of an anticoagulant with his recent embolic stroke.  I requested records from [facility] he was initially evaluated 3-5 years ago he did not have a coronary angiogram but did have a myocardial perfusion study performed.  We discussed the role of an ICD he is willing to consider more readdressed when he seen back in the office in 1 week. 2. Stable he remains in sinus rhythm, I am concerned about recurrence and we see him back in a week if he is improved I will place him on low-dose amiodarone.  He will remain anticoagulated 3. Stable and improved after recent stroke continue his anticoagulant he is no longer on aspirin 4. Worsened he has unexplained symptomatic hypotension medications adjusted and labs will be checked to look at renal function and hemoglobin with recent institution of anticoagulants. 5. Stable pattern on EKG his QRS is not wide enough for consideration of biventricular pacing.   Next appointment: One week   Medication Adjustments/Labs and Tests Ordered: Current medicines are reviewed at length with the patient today.  Concerns regarding medicines are outlined above.    Orders Placed This Encounter  Procedures  . CBC  . Basic Metabolic Panel (BMET)  . B Nat Peptide   Meds ordered this encounter  Medications  . carvedilol (COREG) 25 MG tablet    Sig: Take 0.5 tablets (12.5 mg total) by mouth 2 (two) times daily.    Dispense:  60 tablet    Refill:  3  . lisinopril (PRINIVIL,ZESTRIL) 5 MG tablet    Sig: Take 0.5 tablets (2.5 mg total) by mouth daily.    Dispense:  30 tablet    Refill:  3    Chief Complaint  Patient presents with  . Hospitalization Follow-up    recent stroke  . Atrial Fibrillation    History of Present Illness:    Nathan [name] is a 71 y.o. 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 He had been seen by EP and declined an ICD. In the interim he had a recent stroke and AF CHADS2 vasc =6  and is now anticoagulated.. Compliance with diet, lifestyle and medications: Yes He is made good functional recovery and is pleased with the quality of his life is had episodes of weakness and has been noted to have blood pressures of less than 90 on several occasions over the last week.  He has had no obvious bleeding but does  feel weak and will quickly assess his hemoglobin today.  He was unaware of atrial fibrillation that occurred I suspect he has had previous recurrence he is at high risk of recurrent atrial fibrillation and I will plan on putting him on low-dose amiodarone in a week if he is feeling improved.  We discussed the role of ICD he is willing to consider and if agreeable I will refer him to EP colleagues after his next visit.  I will review his previous records from when he was first diagnosed as a nonischemic cardiopathy cardiomyopathy make a decision if he needs further evaluation with a cardiac CTA.  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 Atrial fibrillation with RVR (HCC) 05/01/2017  . Cardiomyopathy (HCC) 05/01/2017  . CHF (congestive heart failure) (HCC)   . 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 DM (diabetes mellitus) type 2, uncontrolled, with ketoacidosis (HCC) 05/01/2017  . Essential hypertension 11/26/2016  . HLD (hyperlipidemia) 05/01/2017  . IVCD (intraventricular conduction defect) 04/24/2015  . Mixed dyslipidemia 11/26/2016  . 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 IR PERCUTANEOUS ART THROMBECTOMY/INFUSION INTRACRANIAL INC DIAG ANGIO  04/29/2017  . IR US GUIDE VASC ACCESS LEFT  04/29/2017  . IR US GUIDE VASC ACCESS RIGHT  04/29/2017  . RADIOLOGY WITH ANESTHESIA N/A 04/29/2017   Procedure: IR WITH ANESTHESIA;  Surgeon: Radiologist, Medication, MD;  Location: [facility] OR;  Service: Radiology;  Laterality: N/A;    Current Medications: Current Meds  Medication Sig  . apixaban (ELIQUIS) 5 MG TABS tablet Take 1 tablet (5 mg total) 2 (two) times daily by mouth.  . carvedilol (COREG) 25 MG tablet Take 0.5 tablets (12.5 mg total) by mouth 2 (two) times daily.  . cyclobenzaprine (FLEXERIL) 10 MG tablet Take 10 mg by mouth 2 (two) times daily as needed.  [name]. glipiZIDE (GLUCOTROL XL) 10 MG 24 hr tablet Take 10 mg by mouth daily with breakfast.   . lisinopril (PRINIVIL,ZESTRIL) 5 MG tablet Take 0.5 tablets (2.5 mg total) by mouth daily.  . meclizine (ANTIVERT) 25  MG tablet Take 25 mg by mouth daily as needed.  . meloxicam (MOBIC) 15 MG tablet Take 15 mg by mouth daily as needed.  . mupirocin ointment (BACTROBAN) 2 % Apply 1 application topically daily as needed.  . naproxen sodium (ALEVE) 220 MG tablet Take 220 mg by mouth daily as needed (pain).  . repaglinide (PRANDIN) 0.5 MG tablet Take 0.5 tablets by mouth 3 (three) times daily with meals.  . simvastatin (ZOCOR) 40 MG tablet Take 40 mg by mouth at bedtime.   . tamsulosin (FLOMAX) 0.4 MG CAPS capsule Take 0.4 mg by mouth daily.  . [DISCONTINUED] carvedilol (COREG) 25 MG tablet Take 25 mg by mouth 2 (two) times daily.  . [DISCONTINUED] lisinopril (PRINIVIL,ZESTRIL) 5 MG tablet Take 5 mg by mouth daily.     Allergies:  Fentanyl; Promethazine hcl; Metformin and related; Penicillins; Sulfa antibiotics; and Foeniculum vulgare   Social History   Socioeconomic History  . Marital status: Married    Spouse name: None  . Number of children: None  . Years of education: None  . Highest education level: None  Social Needs  . Financial resource strain: None  . Food insecurity - worry: None  . Food insecurity - inability: None  . Transportation needs - medical: None  . Transportation needs - non-medical: None  Occupational History  . None  Tobacco Use  . Smoking status: Never Smoker  . Smokeless tobacco: Never Used  Substance and Sexual Activity  . Alcohol use: Yes  . Drug use: No  . Sexual activity: None  Other Topics Concern  . None  Social History Narrative  . None     Family History: The patient's family history includes Diabetes in his father; Hypertension in his father. ROS:   Please see the history of present illness.    All other systems reviewed and are negative.  EKGs/Labs/Other Studies Reviewed:    The following studies were reviewed today: EKG 05/01/17 AF rapid rate incomplete LBBB EKG:  EKG ordered today.  The ekg ordered today demonstrates sinus rhythm incomplete left  bundle branch block TTE: Impressions: - Mildly dilated LV with mild LV hypertrophy. EF 30-35%.   Inferolateral akinesis, anterolateral severe hypokinesis. Normal   RV size and systolic function. At least moderate, eccentric   posteriorly-directed mitral regurgitation. Wall motion   abnormalities raise concern for infarct-related MR. Recent Labs: 04/29/2017: ALT 22 05/03/2017: BUN 13; Creatinine, Ser 0.79; Hemoglobin 11.1; Platelets 183; Potassium 3.8; Sodium 135; TSH 2.166  Recent Lipid Panel    Component Value Date/Time   CHOL 102 04/29/2017 0451   TRIG 104 04/29/2017 0451   HDL 30 (L) 04/29/2017 0451   CHOLHDL 3.4 04/29/2017 0451   VLDL 21 04/29/2017 0451   LDLCALC 51 04/29/2017 0451    Physical Exam:    VS:  BP (!) 84/62 (BP Location: Right Arm, Patient Position: Sitting, Cuff Size: Normal)   Pulse 71   Ht 5\' 11"  (1.803 m)   Wt 227 lb (103 kg)   SpO2 97%   BMI 31.66 kg/m     Wt Readings from Last 3 Encounters:  05/18/17 227 lb (103 kg)  05/17/17 220 lb (99.8 kg)  05/03/17 236 lb 6.4 oz (107.2 kg)     GEN: pallor of skin Well nourished, well developed in no acute distress HEENT: Normal NECK: No JVD; No carotid bruits LYMPHATICS: No lymphadenopathy CARDIAC: RRR, no murmurs, rubs, gallops RESPIRATORY:  Clear to auscultation without rales, wheezing or rhonchi  ABDOMEN: Soft, non-tender, non-distended MUSCULOSKELETAL:  No edema; No deformity  SKIN: Warm and dry NEUROLOGIC:  Alert and oriented x 3 PSYCHIATRIC:  Normal affect    Signed, Norman HerrlichBrian Tessa Seaberry, MD  05/18/2017 9:36 AM    [facility]

## 2017-05-18 NOTE — Patient Instructions (Signed)
Medication Instructions:  Your physician has recommended you make the following change in your medication:   Take a 2 dose break from carvedilol and lisinopril, tonight and tomorrow morning. DECREASE carvedilol to 12.5 mg twice daily DECREASE lisinopril to 2.5 mg daily  Labwork: Your physician recommends that you return for lab work in: today. CBC, CMP, BNP  Testing/Procedures: None  Follow-Up: Your physician recommends that you schedule a follow-up appointment in: 1 week.  Any Other Special Instructions Will Be Listed Below (If Applicable).     If you need a refill on your cardiac medications before your next appointment, please call your pharmacy.

## 2017-05-19 LAB — CBC
HEMOGLOBIN: 13.1 g/dL (ref 13.0–17.7)
Hematocrit: 39.7 % (ref 37.5–51.0)
MCH: 30.6 pg (ref 26.6–33.0)
MCHC: 33 g/dL (ref 31.5–35.7)
MCV: 93 fL (ref 79–97)
Platelets: 248 10*3/uL (ref 150–379)
RBC: 4.28 x10E6/uL (ref 4.14–5.80)
RDW: 13.3 % (ref 12.3–15.4)
WBC: 6.7 10*3/uL (ref 3.4–10.8)

## 2017-05-19 LAB — BASIC METABOLIC PANEL
BUN / CREAT RATIO: 13 (ref 10–24)
BUN: 16 mg/dL (ref 8–27)
CO2: 20 mmol/L (ref 20–29)
CREATININE: 1.21 mg/dL (ref 0.76–1.27)
Calcium: 9.9 mg/dL (ref 8.6–10.2)
Chloride: 100 mmol/L (ref 96–106)
GFR calc Af Amer: 70 mL/min/{1.73_m2} (ref >59)
GFR, EST NON AFRICAN AMERICAN: 61 mL/min/{1.73_m2} (ref >59)
GLUCOSE: 209 mg/dL — AB (ref 65–99)
Potassium: 5.6 mmol/L — ABNORMAL HIGH (ref 3.5–5.2)
SODIUM: 138 mmol/L (ref 134–144)

## 2017-05-19 LAB — BRAIN NATRIURETIC PEPTIDE: BNP: 92.3 pg/mL (ref 0.0–100.0)

## 2017-05-23 ENCOUNTER — Other Ambulatory Visit: Payer: Self-pay

## 2017-05-23 NOTE — Patient Outreach (Signed)
[facility] Care Management  05/23/2017  Ileana LaddRandleman D Dayna RamusFerree Jr [birth year] [id number]   Medication Adherence call to Mr. Wilhemina CashRandleman Baksh Jr. Patient is showing past due under [employer]on Simvastatin 40 mg ans Lisinopril 5 mg spoke to patient he already pick [facility] but, patient thinks doctor is going to take him of simvastatin, he recently had a stroke and thinks all his medication are going to change.   Lillia AbedAna Ollison-Moran CPhT Pharmacy Technician [facility] Care Management Direct Dial [phone]  Fax [phone] [email or web address]

## 2017-05-24 NOTE — Progress Notes (Signed)
Cardiology Office Note:    Date:  05/25/2017   ID:  Nathan [name], DOB 1945-12-11, MRN [id number]  PCP:  Hadley Penobbins, Robert A, MD  Cardiologist:  Norman HerrlichBrian Munley, MD    Referring MD: Hadley Penobbins, Robert A, MD    ASSESSMENT:    1. Hypotension due to drugs   2. Paroxysmal atrial fibrillation (HCC)   3. Chronic anticoagulation   4. Hypertensive heart disease with heart failure (HCC)   5. Chronic systolic heart failure (HCC)   6. Hyperlipidemia, unspecified hyperlipidemia type    PLAN:    In order of problems listed above:  1. He remains symptomatic and has had hypotension at home this week. We will transition a stop his ACE inhibitor again reduces Couric by 50% and then reassess in the office in a few weeks to see whether he can resume a vasodilator. His wife has checked his heart rate at home and it is not fast slow or irregular. 2. He will start low-dose amiodarone with his cardiomyopathy and atrial fibrillation and continue his current anticoagulant 3. Continue his current anticoagulant 4. He continues to have symptomatic hypotension at home and a drop from 1:30 to 100 systolic standing his ACE inhibitor will be discontinued. Presently he has no fluid overload is not on a loop diuretic 5. Continue statin but reduce 50% with amiodarone interaction   Next appointment: 3 weeks   Medication Adjustments/Labs and Tests Ordered: Current medicines are reviewed at length with the patient today.  Concerns regarding medicines are outlined above.  Orders Placed This Encounter  Procedures  . CT CORONARY MORPH W/CTA COR W/SCORE W/CA W/CM &/OR WO/CM  . CT CORONARY FRACTIONAL FLOW RESERVE DATA PREP  . CT CORONARY FRACTIONAL FLOW RESERVE FLUID ANALYSIS   Meds ordered this encounter  Medications  . carvedilol (COREG) 6.25 MG tablet    Sig: Take 1 tablet (6.25 mg total) by mouth 2 (two) times daily.    Dispense:  180 tablet    Refill:  3  . amiodarone (PACERONE) 200 MG tablet    Sig:  Take 1 tablet (200 mg total) by mouth daily.    Dispense:  90 tablet    Refill:  3  . simvastatin (ZOCOR) 40 MG tablet    Sig: Take 0.5 tablets (20 mg total) by mouth at bedtime.    Dispense:  30 tablet    Refill:  2    Chief Complaint  Patient presents with  . Follow-up    History of Present Illness:    Nathan [name] is a 71 y.o. 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 He had been seen by EP and declined an ICD. In the interim he had a recent stroke and AF CHADS2 vasc =6  and is now anticoagulated.. last seen 1 wwek ago and initiated on amiodarone..I reviewed old [facility] records and he has not had an ischemic evaluation. Compliance with diet, lifestyle and medications: Yes He is improved but still has weakness and he had documented hypotension with physical therapy at home yesterday. His blood pressure drops from 1:30 to 100 systolic standing I will discontinue his ACE inhibitor transiently again reduce his beta blocker dose by 50%. He has no edema shortness of breath palpitation or syncope and continues on his anticoagulant. I reviewed old records and he never had an ischemia evaluation cardiac CTA ordered.  Anal function is normal. Past Medical History:  Diagnosis Date  . Atrial fibrillation with RVR (HCC) 05/01/2017  . Cardiomyopathy (HCC) 05/01/2017  . CHF (congestive heart failure) (HCC)   . 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 DM (diabetes mellitus) type 2, uncontrolled, with ketoacidosis (HCC) 05/01/2017  . Essential hypertension 11/26/2016  . HLD (hyperlipidemia) 05/01/2017  . IVCD (intraventricular conduction defect) 04/24/2015  . Mixed dyslipidemia 11/26/2016  . 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 IR PERCUTANEOUS ART THROMBECTOMY/INFUSION INTRACRANIAL INC DIAG  ANGIO  04/29/2017  . IR US GUIDE VASC ACCESS LEFT  04/29/2017  . IR US GUIDE VASC ACCESS RIGHT  04/29/2017  . RADIOLOGY WITH ANESTHESIA N/A 04/29/2017   Procedure: IR WITH ANESTHESIA;  Surgeon: Radiologist, Medication, MD;  Location: [facility] OR;  Service: Radiology;  Laterality: N/A;    Current Medications: Current Meds  Medication Sig  . apixaban (ELIQUIS) 5 MG TABS tablet Take 1 tablet (5 mg total) 2 (two) times daily by mouth.  . cyclobenzaprine (FLEXERIL) 10 MG tablet Take 10 mg by mouth 2 (two) times daily as needed.  [name]. glipiZIDE (GLUCOTROL XL) 10 MG 24 hr tablet Take 10 mg by mouth daily with breakfast.   . lisinopril (PRINIVIL,ZESTRIL) 5 MG tablet Take 0.5 tablets (2.5 mg total) by mouth daily.  . meclizine (ANTIVERT) 25 MG tablet Take 25 mg by mouth daily as needed.  . meloxicam (MOBIC) 15 MG tablet Take 15 mg by mouth daily as needed.  . mupirocin ointment (BACTROBAN) 2 % Apply 1 application topically daily as needed.  . naproxen sodium (ALEVE) 220 MG tablet Take 220 mg by mouth daily as needed (pain).  . repaglinide (PRANDIN) 0.5 MG tablet Take 0.5 tablets by mouth 3 (three) times daily with meals.  . simvastatin (ZOCOR) 40 MG tablet Take 0.5 tablets (20 mg total) by mouth at bedtime.  . tamsulosin (FLOMAX) 0.4 MG CAPS capsule Take 0.4 mg by mouth daily.  . [DISCONTINUED] carvedilol (COREG) 25 MG tablet Take 0.5 tablets (12.5 mg total) by mouth 2 (two) times daily.  . [DISCONTINUED] simvastatin (ZOCOR) 40 MG tablet Take 40 mg by mouth at bedtime.      Allergies:   Fentanyl; Promethazine hcl; Metformin and related; Penicillins; Sulfa antibiotics; and Foeniculum vulgare   Social History   Socioeconomic History  . Marital status: Married    Spouse name: None  . Number of children: None  . Years of education: None  . Highest education level: None  Social Needs  . Financial resource strain: None  . Food insecurity - worry: None  . Food insecurity - inability: None  . Transportation  needs - medical: None  . Transportation needs - non-medical: None  Occupational History  . None  Tobacco Use  . Smoking status: Never Smoker  . Smokeless tobacco: Never Used  Substance and Sexual Activity  . Alcohol use: Yes  . Drug use: No  . Sexual activity: None  Other Topics Concern  . None  Social History Narrative  . None     Family History: The patient's family history includes Diabetes in his father; Hypertension in his father. ROS:   Please see the history of present illness.    All other systems reviewed and are negative.  EKGs/Labs/Other Studies Reviewed:    The following studies were reviewed today:   Recent Labs: 04/29/2017: ALT 22 05/03/2017: TSH 2.166 05/18/2017: BNP 92.3; BUN 16; Creatinine, Ser  1.21; Hemoglobin 13.1; Platelets 248; Potassium 5.6; Sodium 138  Recent Lipid Panel    Component Value Date/Time   CHOL 102 04/29/2017 0451   TRIG 104 04/29/2017 0451   HDL 30 (L) 04/29/2017 0451   CHOLHDL 3.4 04/29/2017 0451   VLDL 21 04/29/2017 0451   LDLCALC 51 04/29/2017 0451    Physical Exam:    VS:  BP 114/86 (BP Location: Right Arm, Patient Position: Sitting, Cuff Size: Normal)   Pulse 80   Ht 5\' 11"  (1.803 m)   Wt 225 lb (102.1 kg)   SpO2 98%   BMI 31.38 kg/m     Wt Readings from Last 3 Encounters:  05/25/17 225 lb (102.1 kg)  05/18/17 227 lb (103 kg)  05/17/17 220 lb (99.8 kg)     GEN:  Well nourished, well developed in no acute distress HEENT: Normal NECK: No JVD; No carotid bruits LYMPHATICS: No lymphadenopathy CARDIAC: RRR, no murmurs, rubs, gallops RESPIRATORY:  Clear to auscultation without rales, wheezing or rhonchi  ABDOMEN: Soft, non-tender, non-distended MUSCULOSKELETAL:  No edema; No deformity  SKIN: Warm and dry NEUROLOGIC:  Alert and oriented x 3 PSYCHIATRIC:  Normal affect    Signed, Norman Herrlich, MD  05/25/2017 3:56 PM    [facility]

## 2017-05-25 ENCOUNTER — Encounter: Payer: Self-pay | Admitting: Cardiology

## 2017-05-25 ENCOUNTER — Ambulatory Visit (INDEPENDENT_AMBULATORY_CARE_PROVIDER_SITE_OTHER): Payer: Medicare Other | Admitting: Cardiology

## 2017-05-25 VITALS — BP 114/86 | HR 80 | Ht 71.0 in | Wt 225.0 lb

## 2017-05-25 DIAGNOSIS — I952 Hypotension due to drugs: Secondary | ICD-10-CM

## 2017-05-25 DIAGNOSIS — I5022 Chronic systolic (congestive) heart failure: Secondary | ICD-10-CM

## 2017-05-25 DIAGNOSIS — E785 Hyperlipidemia, unspecified: Secondary | ICD-10-CM

## 2017-05-25 DIAGNOSIS — Z7901 Long term (current) use of anticoagulants: Secondary | ICD-10-CM

## 2017-05-25 DIAGNOSIS — I480 Paroxysmal atrial fibrillation: Secondary | ICD-10-CM | POA: Diagnosis not present

## 2017-05-25 DIAGNOSIS — I110 Hypertensive heart disease with heart failure: Secondary | ICD-10-CM | POA: Diagnosis not present

## 2017-05-25 MED ORDER — AMIODARONE HCL 200 MG PO TABS: 200.0000 mg | ORAL_TABLET | Freq: Every day | ORAL | 3 refills | Status: DC

## 2017-05-25 MED ORDER — SIMVASTATIN 40 MG PO TABS: 20.0000 mg | ORAL_TABLET | Freq: Every day | ORAL | 2 refills | Status: DC

## 2017-05-25 MED ORDER — CARVEDILOL 6.25 MG PO TABS: 6.2500 mg | ORAL_TABLET | Freq: Two times a day (BID) | ORAL | 3 refills | Status: DC

## 2017-05-25 NOTE — Patient Instructions (Addendum)
Medication Instructions:  Your physician has recommended you make the following change in your medication:  STOP Lisinopril  START Amiodarone 200 mg daily START  Carvedilol to 6.25 mg twice a day  DECREASE Simvastatin to 0.5 = 20 mg per day  Labwork: None  Testing/Procedures: You had an EKG today.  You will be contacted to schedule a cardiac CT.  Follow-Up: Your physician recommends that you schedule a follow-up appointment in: 4 weeks    Any Other Special Instructions Will Be Listed Below (If Applicable).     If you need a refill on your cardiac medications before your next appointment, please call your pharmacy.   Please arrive at the [facility] main entrance of [facility] at  (30-45 minutes prior to test start time)  [facility] [address] [place], [place] [address] [phone]  Proceed to the [facility] Radiology Department (First Floor).  Please follow these instructions carefully (unless otherwise directed):  Hold all erectile dysfunction medications at least 48 hours prior to test.  On the Night Before the Test: . Drink plenty of water. . Do not consume any caffeinated/decaffeinated beverages or chocolate 12 hours prior to your test. . Do not take any antihistamines 12 hours prior to your test.  On the Day of the Test: . Drink plenty of water. Do not drink any water within one hour of the test. . Do not eat any food 4 hours prior to the test. . You may take your regular medications prior to the test.  After the Test: . Drink plenty of water. . After receiving IV contrast, you may experience a mild flushed feeling. This is normal. . On occasion, you may experience a mild rash up to 24 hours after the test. This is not dangerous. If this occurs, you can take Benadryl 25 mg and increase your fluid intake. . If you experience trouble breathing, this can be serious. If it is severe call 911 IMMEDIATELY. If it is mild, please call  our office. . If you take any of these medications: Glipizide/Metformin, Avandament, Glucavance, please do not take 48 hours after completing test.

## 2017-05-26 ENCOUNTER — Telehealth: Payer: Self-pay | Admitting: Cardiology

## 2017-05-26 NOTE — Telephone Encounter (Signed)
BP is  115/72 sitting 71 pulse  90/66 standing  84 pulse

## 2017-05-26 NOTE — Telephone Encounter (Signed)
PT came to patient's home: BP sitting: 135/85  Did exercise Stopped and blood pressure was dropping: patient states the PT didn't tell him the BP, but told him it was dropping. Patient did not experience symptoms when blood pressure was dropping. Patient does state that upper body feels great.  From PT standpoint he is stable for movement.  I did ask patient to take his blood pressure sitting and then standing up within the next 10-15 minutes and give values when return call. Please advise if any adjustments should be made.

## 2017-05-26 NOTE — Telephone Encounter (Signed)
Review sitting and standing BP.

## 2017-05-26 NOTE — Telephone Encounter (Signed)
Better  

## 2017-05-26 NOTE — Telephone Encounter (Signed)
When he stands up his blood pressure drops

## 2017-05-26 NOTE — Telephone Encounter (Signed)
Patient advised that numbers are better and to keep medications the same. Patient verbalized understanding. No further questions.

## 2017-06-07 ENCOUNTER — Encounter: Payer: Self-pay | Admitting: Interventional Radiology

## 2017-06-23 ENCOUNTER — Ambulatory Visit: Payer: Medicare Other | Admitting: Cardiology

## 2017-07-01 ENCOUNTER — Telehealth: Payer: Self-pay

## 2017-07-01 DIAGNOSIS — I5022 Chronic systolic (congestive) heart failure: Secondary | ICD-10-CM

## 2017-07-01 NOTE — Telephone Encounter (Signed)
Advised patient BMET needed for Cardiac CT. Patient will go to the [facility] in [place] for lab work. Appointment with Dr. Dulce SellarMunley rescheduled for after cardiac CT. Mailing calendar to patient's home per his request of appointment date and time.

## 2017-07-06 ENCOUNTER — Encounter (INDEPENDENT_AMBULATORY_CARE_PROVIDER_SITE_OTHER): Payer: Self-pay

## 2017-07-06 ENCOUNTER — Ambulatory Visit: Payer: Medicare Other | Admitting: Neurology

## 2017-07-06 ENCOUNTER — Encounter: Payer: Self-pay | Admitting: Neurology

## 2017-07-06 VITALS — BP 104/75 | HR 74 | Ht 71.0 in | Wt 229.8 lb

## 2017-07-06 DIAGNOSIS — I63411 Cerebral infarction due to embolism of right middle cerebral artery: Secondary | ICD-10-CM

## 2017-07-06 DIAGNOSIS — Z7901 Long term (current) use of anticoagulants: Secondary | ICD-10-CM | POA: Diagnosis not present

## 2017-07-06 DIAGNOSIS — I480 Paroxysmal atrial fibrillation: Secondary | ICD-10-CM

## 2017-07-06 DIAGNOSIS — E1110 Type 2 diabetes mellitus with ketoacidosis without coma: Secondary | ICD-10-CM

## 2017-07-06 DIAGNOSIS — I420 Dilated cardiomyopathy: Secondary | ICD-10-CM | POA: Diagnosis not present

## 2017-07-06 NOTE — Patient Instructions (Addendum)
-   continue eliqiuis and zocor for stroke prevention - follow up with cardiology regularly - Follow up with your primary care physician for stroke risk factor modification. Recommend maintain blood pressure goal <130/80, diabetes with hemoglobin A1c goal below 7.0% and lipids with LDL cholesterol goal below 70 mg/dL.  - check BP and glucose at home. Avoid low BP  - healthy diet and regular exercise - standing up and sitting up slowly to avoid fall.  - follow up in 4 months.

## 2017-07-06 NOTE — Progress Notes (Signed)
STROKE NEUROLOGY FOLLOW UP NOTE  NAME: Nathan [name] DOB: 1945/10/18  REASON FOR VISIT: stroke follow up HISTORY FROM: Patient and chart and wife  Today we had the pleasure of seeing [name] in follow-up at our Neurology Clinic. Pt was accompanied by wife.   History Summary [name] a 72 y.o.malewith DM and cardiomyopathy EF of 30% admitted on 04/28/17 for left-sidedweakness.He was taken to [facility] ER where he was givenIV TPA.He had a CTA showing a possibledistal M2 occlusion, with very mild symptoms. En route, he markedly worsened, after he arrived he was taken for a stat CT perfusion which demonstrates a large penumbra. He received thrombectomy for occluded right proximal M2.   MRI showed acute right frontal MCA infarct with petechial hemorrhage within the insular component.  MRA head concerning for right M2 stenosis vs. Motion artifact.  EF 30-35%.  No DVT.  LDL 51 and A1c 8.4. He was found to have new diagnosed A. fib RVR, cardiology consulted put on Cardizem drip and later discontinued.  Resumed Coreg and started Eliquis.  Discharge home with Eliquis and continue Zocor.  Interval History During the interval time, the patient has been doing well.  Follow with cardiologist and adjusted dose for Coreg and discontinued lisinopril due to orthostatic hypotension.  Currently only on Coreg 6.25 mg twice daily.  Plan to have coronary artery CT in 2 weeks for cardiac evaluation.  BP today 104/75 and sitting position.  Stated recent A1c 6.9 and his glucose at home still fluctuating.  Finished PT/OT, recovered well physically.  Discuss about repeat MRA for clear vessel picture, patient declined due to claustrophobia.  REVIEW OF SYSTEMS: Full 14 system review of systems performed and notable only for those listed below and in HPI above, all others are negative:  Constitutional:   Cardiovascular:  Ear/Nose/Throat:   Skin:  Eyes:   Respiratory:     Gastroitestinal:   Genitourinary:  Hematology/Lymphatic:   Endocrine:  Musculoskeletal:   Allergy/Immunology:   Neurological:   Psychiatric:  Sleep:   The following represents the patient's updated allergies and side effects list: Allergies  Allergen Reactions  . Fentanyl Shortness Of Breath  . Promethazine Hcl Other (See Comments)    Cardiac arrest  . Metformin And Related Diarrhea  . Penicillins Nausea Only  . Sulfa Antibiotics Other (See Comments)    G.I. Upset  . Foeniculum Vulgare Other (See Comments)    Fennel bulbs    The neurologically relevant items on the patient's problem list were reviewed on today's visit.  Neurologic Examination  A problem focused neurological exam (12 or more points of the single system neurologic examination, vital signs counts as 1 point, cranial nerves count for 8 points) was performed.  Blood pressure 104/75, pulse 74, height 5\' 11"  (1.803 m), weight 229 lb 12.8 oz (104.2 kg).  General - Well nourished, well developed, in no apparent distress.  Ophthalmologic - Sharp disc margins OU.   Cardiovascular - Regular rate and rhythm, not in afib.  Mental Status -  Level of arousal and orientation to time, place, and person were intact. Language including expression, naming, repetition, comprehension was assessed and found intact. Attention span and concentration were normal. Fund of Knowledge was assessed and was intact.  Cranial Nerves II - XII - II - Visual field intact OU. III, IV, VI - Extraocular movements intact. V - Facial sensation intact bilaterally. VII - Facial movement intact bilaterally. VIII - Hearing & vestibular intact  bilaterally. X - Palate elevates symmetrically. XI - Chin turning & shoulder shrug intact bilaterally. XII - Tongue protrusion intact.  Motor Strength - The patient's strength was normal in all extremities and pronator drift was absent.  Bulk was normal and fasciculations were absent.   Motor Tone -  Muscle tone was assessed at the neck and appendages and was normal.  Reflexes - The patient's reflexes were 1+ in all extremities and he had no pathological reflexes.  Sensory - Light touch, temperature/pinprick, vibration and proprioception, and Romberg testing were assessed and were normal.    Coordination - The patient had normal movements in the hands and feet with no ataxia or dysmetria.  Tremor was absent.  Gait and Station - The patient's transfers, posture, gait, station, and turns were observed as normal.   Functional score  mRS = 0   0 - No symptoms.   1 - No significant disability. Able to carry out all usual activities, despite some symptoms.   2 - Slight disability. Able to look after own affairs without assistance, but unable to carry out all previous activities.   3 - Moderate disability. Requires some help, but able to walk unassisted.   4 - Moderately severe disability. Unable to attend to own bodily needs without assistance, and unable to walk unassisted.   5 - Severe disability. Requires constant nursing care and attention, bedridden, incontinent.   6 - Dead.   NIH Stroke Scale = 0   Data reviewed: I personally reviewed the images and agree with the radiology interpretations.  Ct Head Wo Contrast 04/29/2017 IMPRESSION:  1. Evolving infarction and right superior insula and frontal operculum measuring 4.6 cc within the region of perfusion anomaly on prior CT perfusion.  2. No new large territory acute infarction, hemorrhage, or mass effect.  3. Mild chronic microvascular ischemic changes and parenchymal volume loss of the brain.   Ct Head Wo Contrast 04/29/2017 IMPRESSION:  No intracranial hemorrhage or cytotoxic edema following vascular intervention.   [name] Head Wo Contrast 04/30/2017 MRI HEAD: 1. Mildly motion degraded examination.  2. Acute patchy RIGHT frontal lobe infarct, with petechial hemorrhage within insular component.  3. Mild to  moderate chronic small vessel ischemic disease.  MRA HEAD:  1. Moderately motion degraded examination.  2. No emergent large vessel occlusion or flow-limiting stenosis.  3. Focal stenosis versus motion artifact RIGHT M2 origin.   Cerebral angiogram 04/29/2017 Status post cerebral angiogram and mechanical thrombectomyof proximal occlusion of the superior and dominant MCAbranch of the right hemisphere with restoration of TICI 3 flow.  Ct Head Code Stroke Wo Contrast Ct Cerebral Perfusion W Contrast 04/29/2017 IMPRESSION:  1. No acute hemorrhage or mass lesion.  2. ASPECTS is 10.  3. Elevated mean transit time within the anterior right MCA distribution without associated abnormal cerebral blood volume or cerebral blood flow likely indicates an area of acute ischemia without infarction.   Dg Chest Port 1 View 04/29/2017 IMPRESSION:  Cardiomegaly with mild vascular congestion.   Dg Chest Port 1 View 05/01/17 IMPRESSION: Cardiomegaly. No pulmonary edema identified. No acute abnormality.  Transthoracic Echocardiogram  Impressions: - Mildly dilated LV with mild LV hypertrophy. EF 30-35%. Inferolateral akinesis, anterolateral severe hypokinesis. Normal RV size and systolic function. At least moderate, eccentric posteriorly-directed mitral regurgitation. Wall motion abnormalities raise concern for infarct-related MR.  Bilateral Lower Extremity Dopplers -No evidence of DVT in the veins that were clearly visualized. The right common femoral vein was not imaged due to surgical bandages  in place  EEG 04/29/2017 The EEG is abnormal and findings are suggestive of generalized and right fronto temporal focal cerebral dysfunction.Epileptiform activity was not seenduring this recording   05/03/17 Repeat Head CT IMPRESSION: Acute infarct in the insula and right frontal operculum unchanged from prior MRI. No hemorrhage or other acute abnormality.  Component     Latest  Ref Rng & Units 04/29/2017 05/03/2017  Cholesterol     0 - 200 mg/dL 161102   Triglycerides     <150 mg/dL 096104   HDL Cholesterol     >40 mg/dL 30 (L)   Total CHOL/HDL Ratio     RATIO 3.4   VLDL     0 - 40 mg/dL 21   LDL (calc)     0 - 99 mg/dL 51   Hemoglobin E4VA1C     4.8 - 5.6 % 8.4 (H)   Mean Plasma Glucose     mg/dL [phone]   TSH     1.9140.350 - 4.500 uIU/mL  2.166  T4,Free(Direct)     0.61 - 1.12 ng/dL  7.821.14 (H)    Assessment: As you may recall, he is a 72 y.o. Caucasian male with PMH of DM and cardiomyopathy EF of 30% admitted on 04/28/17 for left-sidedweakness.receivedIV TPA.CTA showing a possibledistal M2 occlusion, CT perfusion which demonstrates a large penumbra. He received thrombectomy for occluded right proximal M2.   MRI showed acute right frontal MCA infarct with petechial hemorrhage within the insular component.  MRA head concerning for right M2 stenosis vs. Motion artifact.  EF 30-35%.  No DVT.  LDL 51 and A1c 8.4. He was found to have new diagnosed A. fib RVR.  Discharge home with Eliquis and continue Zocor. Follow with cardiologist and adjusted dose for Coreg and discontinued lisinopril due to orthostatic hypotension.  Plan to have coronary artery CT for cardiac evaluation. Finished PT/OT, recovered well physically.  Discuss about repeat MRA for clear vessel picture, patient declined due to claustrophobia.  Plan:  - continue eliqiuis and zocor for stroke prevention - follow up with cardiology regularly - Follow up with your primary care physician for stroke risk factor modification. Recommend maintain blood pressure goal <130/80, diabetes with hemoglobin A1c goal below 7.0% and lipids with LDL cholesterol goal below 70 mg/dL.  - check BP and glucose at home. Avoid low BP  - healthy diet and regular exercise - standing up and sitting up slowly to avoid fall.  - follow up in 4 months.   No orders of the defined types were placed in this encounter.   No orders of the  defined types were placed in this encounter.   Patient Instructions  - continue eliqiuis and zocor for stroke prevention - follow up with cardiology regularly - Follow up with your primary care physician for stroke risk factor modification. Recommend maintain blood pressure goal <130/80, diabetes with hemoglobin A1c goal below 7.0% and lipids with LDL cholesterol goal below 70 mg/dL.  - check BP and glucose at home. Avoid low BP  - healthy diet and regular exercise - standing up and sitting up slowly to avoid fall.  - follow up in 4 months.    Marvel PlanJindong Daqwan Dougal, MD PhD [facility] [address], [address] [place], [place] [address] [phone]

## 2017-07-09 LAB — BASIC METABOLIC PANEL
BUN/Creatinine Ratio: 15 (ref 10–24)
BUN: 14 mg/dL (ref 8–27)
CO2: 18 mmol/L — ABNORMAL LOW (ref 20–29)
CREATININE: 0.94 mg/dL (ref 0.76–1.27)
Calcium: 9.2 mg/dL (ref 8.6–10.2)
Chloride: 99 mmol/L (ref 96–106)
GFR, EST AFRICAN AMERICAN: 95 mL/min/{1.73_m2} (ref >59)
GFR, EST NON AFRICAN AMERICAN: 82 mL/min/{1.73_m2} (ref >59)
Glucose: 182 mg/dL — ABNORMAL HIGH (ref 65–99)
POTASSIUM: 5.1 mmol/L (ref 3.5–5.2)
SODIUM: 132 mmol/L — AB (ref 134–144)

## 2017-07-11 ENCOUNTER — Ambulatory Visit: Payer: Medicare Other | Admitting: Cardiology

## 2017-07-18 ENCOUNTER — Ambulatory Visit (HOSPITAL_COMMUNITY)
Admission: RE | Admit: 2017-07-18 | Discharge: 2017-07-18 | Disposition: A | Discharge status: Home / Self Care | Payer: Medicare Other | Source: Ambulatory Visit | Attending: Cardiology | Admitting: Cardiology

## 2017-07-18 ENCOUNTER — Ambulatory Visit (HOSPITAL_COMMUNITY): Payer: Medicare Other

## 2017-07-18 DIAGNOSIS — I700 Atherosclerosis of aorta: Secondary | ICD-10-CM | POA: Insufficient documentation

## 2017-07-18 DIAGNOSIS — I509 Heart failure, unspecified: Secondary | ICD-10-CM | POA: Diagnosis not present

## 2017-07-18 DIAGNOSIS — I110 Hypertensive heart disease with heart failure: Secondary | ICD-10-CM | POA: Diagnosis not present

## 2017-07-18 DIAGNOSIS — I77810 Thoracic aortic ectasia: Secondary | ICD-10-CM | POA: Insufficient documentation

## 2017-07-18 DIAGNOSIS — I429 Cardiomyopathy, unspecified: Secondary | ICD-10-CM | POA: Diagnosis present

## 2017-07-18 MED ORDER — IOPAMIDOL (ISOVUE-370) INJECTION 76%
INTRAVENOUS | Status: AC
  Administered 2017-07-18: 100 mL
  Filled 2017-07-18: qty 100

## 2017-07-18 MED ORDER — METOPROLOL TARTRATE 5 MG/5ML IV SOLN
INTRAVENOUS | Status: AC
  Filled 2017-07-18: qty 10

## 2017-07-18 MED ORDER — NITROGLYCERIN 0.4 MG SL SUBL
SUBLINGUAL_TABLET | SUBLINGUAL | Status: AC
  Filled 2017-07-18: qty 2

## 2017-07-18 MED ORDER — NITROGLYCERIN 0.4 MG SL SUBL
0.8000 mg | SUBLINGUAL_TABLET | Freq: Once | SUBLINGUAL | Status: AC
  Administered 2017-07-18: 0.8 mg via SUBLINGUAL
  Filled 2017-07-18: qty 25

## 2017-07-18 MED ORDER — METOPROLOL TARTRATE 5 MG/5ML IV SOLN
5.0000 mg | INTRAVENOUS | Status: DC | PRN
  Administered 2017-07-18 (×2): 5 mg via INTRAVENOUS
  Filled 2017-07-18 (×2): qty 5

## 2017-07-18 MED ORDER — METOPROLOL TARTRATE 5 MG/5ML IV SOLN
INTRAVENOUS | Status: AC
  Filled 2017-07-18: qty 5

## 2017-07-18 MED ORDER — METOPROLOL TARTRATE 5 MG/5ML IV SOLN
2.5000 mg | Freq: Once | INTRAVENOUS | Status: AC
  Administered 2017-07-18: 2.5 mg via INTRAVENOUS
  Filled 2017-07-18: qty 5

## 2017-07-18 NOTE — Progress Notes (Signed)
CT scan completed. Tolerated well. D/c home with wife. Awake and alert. In no distress. 

## 2017-07-28 ENCOUNTER — Ambulatory Visit: Payer: Medicare Other | Admitting: Cardiology

## 2017-07-28 ENCOUNTER — Encounter: Payer: Self-pay | Admitting: Cardiology

## 2017-07-28 VITALS — BP 118/78 | HR 72 | Ht 71.0 in | Wt 228.8 lb

## 2017-07-28 DIAGNOSIS — E785 Hyperlipidemia, unspecified: Secondary | ICD-10-CM | POA: Diagnosis not present

## 2017-07-28 DIAGNOSIS — Z7901 Long term (current) use of anticoagulants: Secondary | ICD-10-CM | POA: Diagnosis not present

## 2017-07-28 DIAGNOSIS — I480 Paroxysmal atrial fibrillation: Secondary | ICD-10-CM | POA: Diagnosis not present

## 2017-07-28 DIAGNOSIS — I2510 Atherosclerotic heart disease of native coronary artery without angina pectoris: Secondary | ICD-10-CM

## 2017-07-28 DIAGNOSIS — I5022 Chronic systolic (congestive) heart failure: Secondary | ICD-10-CM | POA: Diagnosis not present

## 2017-07-28 DIAGNOSIS — Z79899 Other long term (current) drug therapy: Secondary | ICD-10-CM

## 2017-07-28 HISTORY — DX: Other long term (current) drug therapy: Z79.899

## 2017-07-28 HISTORY — DX: Atherosclerotic heart disease of native coronary artery without angina pectoris: I25.10

## 2017-07-28 NOTE — Progress Notes (Signed)
Cardiology Office Note:    Date:  07/28/2017   ID:  Nathan [name], DOB 1945/10/07, MRN [id number]  PCP:  Hadley Penobbins, Robert A, MD  Cardiologist:  Norman HerrlichBrian Chinenye Katzenberger, MD    Referring MD: Hadley Penobbins, Robert A, MD    ASSESSMENT:    1. CAD in native artery   2. On amiodarone therapy   3. Chronic systolic heart failure (HCC)   4. Paroxysmal atrial fibrillation (HCC)   5. Hyperlipidemia, unspecified hyperlipidemia type   6. Chronic anticoagulation    PLAN:    In order of problems listed above:  1. I advised left heart cath and possible PCI patient will consider an give me an answer in the next week.  The etiology of his cardiomyopathy and heart failure is CAD 2. Stable rate controlled continue low-dose amiodarone beta-blocker and anticoagulant 3. Continue low-dose amiodarone check liver and thyroid regarding toxicity 4. Stable compensated continue beta-blocker diuretic at this time avoid ACE arbor ARNI with symptomatic orthostatic hypotension after stroke 5. Continue amiodarone  6. Check liver function lipid profile continue statin 7. Continue his current anticoagulant with embolic stroke    Next appointment: 3 months   Medication Adjustments/Labs and Tests Ordered: Current medicines are reviewed at length with the patient today.  Concerns regarding medicines are outlined above.  Orders Placed This Encounter  Procedures  . Comprehensive Metabolic Panel (CMET)  . TSH  . Lipid Profile  . EKG 12-Lead   No orders of the defined types were placed in this encounter.   Chief Complaint  Patient presents with  . Follow-up    after cardiac CTA     History of Present Illness:    Nathan [name] is a 72 y.o. 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 He had been seen by EP and declined an ICD. In the interim he had a recent stroke and AF CHADS2 vasc  =6 and is now anticoagulated and on amiodarone..he was last seen in November 2018.  Cardiac CTA 07/18/17:  FINDINGS: Non-cardiac: [facility]. No significant findings on limited lung and soft tissue windows. Calcium Score: Dense calcium seen through out the RCA/ LAD and proximal circumflex Coronary Arteries: Right dominant with no anomalies LM: Less than 50% calcific plaque in proximal vessel LAD: Cannot exclude >50% mixed plaque in long segment of proximal LAD D1: Cannot exclude >50% diffuse disease in large vessel D2: Poorly visualized Circumflex: Less than 50% mixed plaque in proximal and mid section OM1: Less than 50% calcific disease OM2: Poorly seen RCA: Dominant appears occluded in mid section with distal RCA likely filling from collaterals or trickle flow IMPRESSION: 1.  Calcium score 849 which is 84 th percentile for age and sex 2. Mild aortic root dilatation 4.0 cm with moderate aortic atherosclerosis 3. Scan suggests Occluded mid RCA and possible high grade disease in proximal LAD/D1 Given patients low EF suggest f/u right and left heart cath  Compliance with diet, lifestyle and medications: yes he has had a significant improvement in speech thought process and activities.  Home blood pressures improved 100 -125 systolic standing no longer orthostatic.  He has had no palpitation no recurrent atrial fibrillation clinically on amiodarone tolerates his anticoagulant without bleeding has had no shortness of breath orthopnea edema or chest pain. His evaluation for CAD shows multivessel coronary artery disease and high  risk markers.  I think he is recovered enough from his stroke to undergo coronary angiography and if possible I think he is a better candidate for PCI and CABG.  I discussed with the patient his wife that we will consider and call the office in the next week to give me an answer.  With amiodarone therapy will do an EKG to confirm  sinus rhythm and check his thyroid and liver function.  He is on simvastatin and also check a CMP along with liver function and potassium with his diuretic.  I will plan to see back in follow-up routinely in 3 months or sooner if he undergoes angiography and intervention. Past Medical History:  Diagnosis Date  . Atrial fibrillation with RVR (HCC) 05/01/2017  . Cardiomyopathy (HCC) 05/01/2017  . CHF (congestive heart failure) (HCC)   . 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 DM (diabetes mellitus) type 2, uncontrolled, with ketoacidosis (HCC) 05/01/2017  . Essential hypertension 11/26/2016  . HLD (hyperlipidemia) 05/01/2017  . IVCD (intraventricular conduction defect) 04/24/2015  . Mixed dyslipidemia 11/26/2016  . 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 IR PERCUTANEOUS ART THROMBECTOMY/INFUSION INTRACRANIAL INC DIAG ANGIO  04/29/2017  . IR RADIOLOGIST EVAL & MGMT  05/17/2017  . IR US GUIDE VASC ACCESS LEFT  04/29/2017  . IR US GUIDE VASC ACCESS RIGHT  04/29/2017  . RADIOLOGY WITH ANESTHESIA N/A 04/29/2017   Procedure: IR WITH ANESTHESIA;  Surgeon: Radiologist, Medication, MD;  Location: [facility] OR;  Service: Radiology;  Laterality: N/A;    Current Medications: Current Meds  Medication Sig  . amiodarone (PACERONE) 200 MG tablet Take 1 tablet (200 mg total) by mouth daily.  [name] apixaban (ELIQUIS) 5 MG TABS tablet Take 1 tablet (5 mg total) 2 (two) times daily by mouth.  . carvedilol (COREG) 6.25 MG tablet Take 1 tablet (6.25 mg total) by mouth 2 (two) times daily.  . cyclobenzaprine (FLEXERIL) 10 MG tablet Take 10 mg by mouth 2 (two) times daily as needed.  [name] glipiZIDE (GLUCOTROL XL) 10 MG 24 hr tablet Take 10 mg by mouth daily with breakfast.   . naproxen sodium (ALEVE) 220 MG tablet Take 220 mg by mouth daily as needed (pain).  . repaglinide (PRANDIN) 0.5 MG tablet Take 1 tablet by mouth 3 (three) times daily with  meals.   . simvastatin (ZOCOR) 40 MG tablet Take 0.5 tablets (20 mg total) by mouth at bedtime.  . tamsulosin (FLOMAX) 0.4 MG CAPS capsule Take 0.4 mg by mouth daily.     Allergies:   Fentanyl; Promethazine hcl; Metformin and related; Penicillins; Sulfa antibiotics; and Foeniculum vulgare   Social History   Socioeconomic History  . Marital status: Married    Spouse name: None  . Number of children: None  . Years of education: None  . Highest education level: None  Social Needs  . Financial resource strain: None  . Food insecurity - worry: None  . Food insecurity - inability: None  . Transportation needs - medical: None  . Transportation needs - non-medical: None  Occupational History  . None  Tobacco Use  . Smoking status: Never Smoker  . Smokeless tobacco: Never Used  Substance and Sexual Activity  . Alcohol use: Yes    Alcohol/week: 1.8 oz    Types: 1 Standard drinks or equivalent, 1 Glasses of wine, 1 Cans of beer per week    Comment: mix drink  . Drug  use: No  . Sexual activity: None  Other Topics Concern  . None  Social History Narrative  . None     Family History: The patient's family history includes Diabetes in his father; Hypertension in his father. ROS:   Please see the history of present illness.    All other systems reviewed and are negative.  EKGs/Labs/Other Studies Reviewed:    The following studies were reviewed today:  EKG:  EKG ordered today.  The ekg ordered today demonstrates sinus rhythm left bundle branch block relatively narrow he would not benefit from CRT  Recent Labs: 04/29/2017: ALT 22 05/03/2017: TSH 2.166 05/18/2017: BNP 92.3; Hemoglobin 13.1; Platelets 248 07/08/2017: BUN 14; Creatinine, Ser 0.94; Potassium 5.1; Sodium 132  Recent Lipid Panel    Component Value Date/Time   CHOL 102 04/29/2017 0451   TRIG 104 04/29/2017 0451   HDL 30 (L) 04/29/2017 0451   CHOLHDL 3.4 04/29/2017 0451   VLDL 21 04/29/2017 0451   LDLCALC 51  04/29/2017 0451    Physical Exam:    VS:  BP 118/78 (BP Location: Left Arm, Patient Position: Sitting, Cuff Size: Large)   Pulse 72   Ht 5\' 11"  (1.803 m)   Wt 228 lb 12.8 oz (103.8 kg)   SpO2 98%   BMI 31.91 kg/m     Wt Readings from Last 3 Encounters:  07/28/17 228 lb 12.8 oz (103.8 kg)  07/06/17 229 lb 12.8 oz (104.2 kg)  05/25/17 225 lb (102.1 kg)     GEN:  Well nourished, well developed in no acute distress HEENT: Normal NECK: No JVD; No carotid bruits LYMPHATICS: No lymphadenopathy CARDIAC: RRR, no murmurs, rubs, gallops RESPIRATORY:  Clear to auscultation without rales, wheezing or rhonchi  ABDOMEN: Soft, non-tender, non-distended MUSCULOSKELETAL:  No edema; No deformity  SKIN: Warm and dry NEUROLOGIC:  Alert and oriented x 3 PSYCHIATRIC:  Normal affect    Signed, Norman Herrlich, MD  07/28/2017 5:05 PM    [facility]

## 2017-07-28 NOTE — H&P (View-Only) (Signed)
Cardiology Office Note:    Date:  07/28/2017   ID:  Nathan [name], DOB 1945/10/07, MRN [id number]  PCP:  Hadley Penobbins, Robert A, MD  Cardiologist:  Norman HerrlichBrian Munley, MD    Referring MD: Hadley Penobbins, Robert A, MD    ASSESSMENT:    1. CAD in native artery   2. On amiodarone therapy   3. Chronic systolic heart failure (HCC)   4. Paroxysmal atrial fibrillation (HCC)   5. Hyperlipidemia, unspecified hyperlipidemia type   6. Chronic anticoagulation    PLAN:    In order of problems listed above:  1. I advised left heart cath and possible PCI patient will consider an give me an answer in the next week.  The etiology of his cardiomyopathy and heart failure is CAD 2. Stable rate controlled continue low-dose amiodarone beta-blocker and anticoagulant 3. Continue low-dose amiodarone check liver and thyroid regarding toxicity 4. Stable compensated continue beta-blocker diuretic at this time avoid ACE arbor ARNI with symptomatic orthostatic hypotension after stroke 5. Continue amiodarone  6. Check liver function lipid profile continue statin 7. Continue his current anticoagulant with embolic stroke    Next appointment: 3 months   Medication Adjustments/Labs and Tests Ordered: Current medicines are reviewed at length with the patient today.  Concerns regarding medicines are outlined above.  Orders Placed This Encounter  Procedures  . Comprehensive Metabolic Panel (CMET)  . TSH  . Lipid Profile  . EKG 12-Lead   No orders of the defined types were placed in this encounter.   Chief Complaint  Patient presents with  . Follow-up    after cardiac CTA     History of Present Illness:    Nathan [name] is a 72 y.o. 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 He had been seen by EP and declined an ICD. In the interim he had a recent stroke and AF CHADS2 vasc  =6 and is now anticoagulated and on amiodarone..he was last seen in November 2018.  Cardiac CTA 07/18/17:  FINDINGS: Non-cardiac: See separate report from [facility]. No significant findings on limited lung and soft tissue windows. Calcium Score: Dense calcium seen through out the RCA/ LAD and proximal circumflex Coronary Arteries: Right dominant with no anomalies LM: Less than 50% calcific plaque in proximal vessel LAD: Cannot exclude >50% mixed plaque in long segment of proximal LAD D1: Cannot exclude >50% diffuse disease in large vessel D2: Poorly visualized Circumflex: Less than 50% mixed plaque in proximal and mid section OM1: Less than 50% calcific disease OM2: Poorly seen RCA: Dominant appears occluded in mid section with distal RCA likely filling from collaterals or trickle flow IMPRESSION: 1.  Calcium score 849 which is 84 th percentile for age and sex 2. Mild aortic root dilatation 4.0 cm with moderate aortic atherosclerosis 3. Scan suggests Occluded mid RCA and possible high grade disease in proximal LAD/D1 Given patients low EF suggest f/u right and left heart cath  Compliance with diet, lifestyle and medications: yes he has had a significant improvement in speech thought process and activities.  Home blood pressures improved 100 -125 systolic standing no longer orthostatic.  He has had no palpitation no recurrent atrial fibrillation clinically on amiodarone tolerates his anticoagulant without bleeding has had no shortness of breath orthopnea edema or chest pain. His evaluation for CAD shows multivessel coronary artery disease and high  risk markers.  I think he is recovered enough from his stroke to undergo coronary angiography and if possible I think he is a better candidate for PCI and CABG.  I discussed with the patient his wife that we will consider and call the office in the next week to give me an answer.  With amiodarone therapy will do an EKG to confirm  sinus rhythm and check his thyroid and liver function.  He is on simvastatin and also check a CMP along with liver function and potassium with his diuretic.  I will plan to see back in follow-up routinely in 3 months or sooner if he undergoes angiography and intervention. Past Medical History:  Diagnosis Date  . Atrial fibrillation with RVR (HCC) 05/01/2017  . Cardiomyopathy (HCC) 05/01/2017  . CHF (congestive heart failure) (HCC)   . 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 DM (diabetes mellitus) type 2, uncontrolled, with ketoacidosis (HCC) 05/01/2017  . Essential hypertension 11/26/2016  . HLD (hyperlipidemia) 05/01/2017  . IVCD (intraventricular conduction defect) 04/24/2015  . Mixed dyslipidemia 11/26/2016  . 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 IR PERCUTANEOUS ART THROMBECTOMY/INFUSION INTRACRANIAL INC DIAG ANGIO  04/29/2017  . IR RADIOLOGIST EVAL & MGMT  05/17/2017  . IR US GUIDE VASC ACCESS LEFT  04/29/2017  . IR US GUIDE VASC ACCESS RIGHT  04/29/2017  . RADIOLOGY WITH ANESTHESIA N/A 04/29/2017   Procedure: IR WITH ANESTHESIA;  Surgeon: Radiologist, Medication, MD;  Location: [facility] OR;  Service: Radiology;  Laterality: N/A;    Current Medications: Current Meds  Medication Sig  . amiodarone (PACERONE) 200 MG tablet Take 1 tablet (200 mg total) by mouth daily.  [name] apixaban (ELIQUIS) 5 MG TABS tablet Take 1 tablet (5 mg total) 2 (two) times daily by mouth.  . carvedilol (COREG) 6.25 MG tablet Take 1 tablet (6.25 mg total) by mouth 2 (two) times daily.  . cyclobenzaprine (FLEXERIL) 10 MG tablet Take 10 mg by mouth 2 (two) times daily as needed.  [name] glipiZIDE (GLUCOTROL XL) 10 MG 24 hr tablet Take 10 mg by mouth daily with breakfast.   . naproxen sodium (ALEVE) 220 MG tablet Take 220 mg by mouth daily as needed (pain).  . repaglinide (PRANDIN) 0.5 MG tablet Take 1 tablet by mouth 3 (three) times daily with  meals.   . simvastatin (ZOCOR) 40 MG tablet Take 0.5 tablets (20 mg total) by mouth at bedtime.  . tamsulosin (FLOMAX) 0.4 MG CAPS capsule Take 0.4 mg by mouth daily.     Allergies:   Fentanyl; Promethazine hcl; Metformin and related; Penicillins; Sulfa antibiotics; and Foeniculum vulgare   Social History   Socioeconomic History  . Marital status: Married    Spouse name: None  . Number of children: None  . Years of education: None  . Highest education level: None  Social Needs  . Financial resource strain: None  . Food insecurity - worry: None  . Food insecurity - inability: None  . Transportation needs - medical: None  . Transportation needs - non-medical: None  Occupational History  . None  Tobacco Use  . Smoking status: Never Smoker  . Smokeless tobacco: Never Used  Substance and Sexual Activity  . Alcohol use: Yes    Alcohol/week: 1.8 oz    Types: 1 Standard drinks or equivalent, 1 Glasses of wine, 1 Cans of beer per week    Comment: mix drink  . Drug  use: No  . Sexual activity: None  Other Topics Concern  . None  Social History Narrative  . None     Family History: The patient's family history includes Diabetes in his father; Hypertension in his father. ROS:   Please see the history of present illness.    All other systems reviewed and are negative.  EKGs/Labs/Other Studies Reviewed:    The following studies were reviewed today:  EKG:  EKG ordered today.  The ekg ordered today demonstrates sinus rhythm left bundle branch block relatively narrow he would not benefit from CRT  Recent Labs: 04/29/2017: ALT 22 05/03/2017: TSH 2.166 05/18/2017: BNP 92.3; Hemoglobin 13.1; Platelets 248 07/08/2017: BUN 14; Creatinine, Ser 0.94; Potassium 5.1; Sodium 132  Recent Lipid Panel    Component Value Date/Time   CHOL 102 04/29/2017 0451   TRIG 104 04/29/2017 0451   HDL 30 (L) 04/29/2017 0451   CHOLHDL 3.4 04/29/2017 0451   VLDL 21 04/29/2017 0451   LDLCALC 51  04/29/2017 0451    Physical Exam:    VS:  BP 118/78 (BP Location: Left Arm, Patient Position: Sitting, Cuff Size: Large)   Pulse 72   Ht 5\' 11"  (1.803 m)   Wt 228 lb 12.8 oz (103.8 kg)   SpO2 98%   BMI 31.91 kg/m     Wt Readings from Last 3 Encounters:  07/28/17 228 lb 12.8 oz (103.8 kg)  07/06/17 229 lb 12.8 oz (104.2 kg)  05/25/17 225 lb (102.1 kg)     GEN:  Well nourished, well developed in no acute distress HEENT: Normal NECK: No JVD; No carotid bruits LYMPHATICS: No lymphadenopathy CARDIAC: RRR, no murmurs, rubs, gallops RESPIRATORY:  Clear to auscultation without rales, wheezing or rhonchi  ABDOMEN: Soft, non-tender, non-distended MUSCULOSKELETAL:  No edema; No deformity  SKIN: Warm and dry NEUROLOGIC:  Alert and oriented x 3 PSYCHIATRIC:  Normal affect    Signed, Norman Herrlich, MD  07/28/2017 5:05 PM    [facility]

## 2017-07-28 NOTE — Patient Instructions (Signed)
Medication Instructions:  Your physician recommends that you continue on your current medications as directed. Please refer to the Current Medication list given to you today.  Labwork: Your physician recommends that you return for lab work in: today. CMP, TSH, lipid  Testing/Procedures: You had an EKG today.  Follow-Up: Your physician recommends that you schedule a follow-up appointment in: 3 months.  Any Other Special Instructions Will Be Listed Below (If Applicable).     If you need a refill on your cardiac medications before your next appointment, please call your pharmacy.

## 2017-07-29 LAB — COMPREHENSIVE METABOLIC PANEL
ALK PHOS: 121 IU/L — AB (ref 39–117)
ALT: 17 IU/L (ref 0–44)
AST: 15 IU/L (ref 0–40)
Albumin/Globulin Ratio: 1.7 (ref 1.2–2.2)
Albumin: 4.8 g/dL (ref 3.6–4.8)
BILIRUBIN TOTAL: 0.3 mg/dL (ref 0.0–1.2)
BUN / CREAT RATIO: 14 (ref 10–24)
BUN: 17 mg/dL (ref 8–27)
CHLORIDE: 104 mmol/L (ref 96–106)
CO2: 25 mmol/L (ref 20–29)
Calcium: 9.5 mg/dL (ref 8.6–10.2)
Creatinine, Ser: 1.25 mg/dL (ref 0.76–1.27)
GFR calc non Af Amer: 58 mL/min/{1.73_m2} — ABNORMAL LOW (ref >59)
GFR, EST AFRICAN AMERICAN: 67 mL/min/{1.73_m2} (ref >59)
GLUCOSE: 150 mg/dL — AB (ref 65–99)
Globulin, Total: 2.8 g/dL (ref 1.5–4.5)
Potassium: 4.5 mmol/L (ref 3.5–5.2)
Sodium: 142 mmol/L (ref 134–144)
TOTAL PROTEIN: 7.6 g/dL (ref 6.0–8.5)

## 2017-07-29 LAB — LIPID PANEL
CHOL/HDL RATIO: 4 ratio (ref 0.0–5.0)
Cholesterol, Total: 136 mg/dL (ref 100–199)
HDL: 34 mg/dL — AB (ref >39)
LDL Calculated: 82 mg/dL (ref 0–99)
TRIGLYCERIDES: 101 mg/dL (ref 0–149)
VLDL Cholesterol Cal: 20 mg/dL (ref 5–40)

## 2017-07-29 LAB — TSH: TSH: 2.14 u[IU]/mL (ref 0.450–4.500)

## 2017-08-05 ENCOUNTER — Telehealth: Payer: Self-pay | Admitting: Cardiology

## 2017-08-05 NOTE — Telephone Encounter (Signed)
Patient states [facility] ED due to elevated blood pressure. Checked troponin, it was negative. Advised him to start lisinopril 2.5 mg daily. Patient does want to be set up for cath. Does patient need an appointment to discuss or can we order?

## 2017-08-05 NOTE — Telephone Encounter (Signed)
Attempted to call patient, no answer, no voice mail

## 2017-08-05 NOTE — Telephone Encounter (Signed)
OK left heart cath possible PCI

## 2017-08-05 NOTE — Telephone Encounter (Signed)
Says he needs to speak to you right away

## 2017-08-08 ENCOUNTER — Other Ambulatory Visit: Payer: Self-pay

## 2017-08-08 ENCOUNTER — Telehealth: Payer: Self-pay

## 2017-08-08 NOTE — Telephone Encounter (Signed)
Informed patient of his appointment for heart cath. Educated patient regarding medications, prep and instructions for cath. Mailed letter to patient.

## 2017-08-09 ENCOUNTER — Other Ambulatory Visit: Payer: Self-pay

## 2017-08-09 NOTE — Patient Outreach (Signed)
Telephone outreach to patient to obtain mRS was successfully completed. mRS = 1 

## 2017-08-11 ENCOUNTER — Telehealth: Payer: Self-pay

## 2017-08-11 DIAGNOSIS — Z01810 Encounter for preprocedural cardiovascular examination: Secondary | ICD-10-CM

## 2017-08-11 NOTE — Telephone Encounter (Signed)
Patient advised to go to [facility] in [place] for lab work prior to cardiac catheterization by Monday 08/15/17. Patient verbalized understanding. No further questions.

## 2017-08-11 NOTE — Telephone Encounter (Signed)
Patient called stating he just received a letter in the mail with his catheterization instructions. His letter states that he should stop Eliquis 08/09/17. Advised patient that was an error. Patient should hold Eliquis 2 days prior to procedure starting 08/16/17. Patient verbalized understanding, no further questions.

## 2017-08-12 LAB — BASIC METABOLIC PANEL
BUN/Creatinine Ratio: 15 (ref 10–24)
BUN: 17 mg/dL (ref 8–27)
CALCIUM: 9.7 mg/dL (ref 8.6–10.2)
CHLORIDE: 101 mmol/L (ref 96–106)
CO2: 20 mmol/L (ref 20–29)
Creatinine, Ser: 1.12 mg/dL (ref 0.76–1.27)
GFR calc non Af Amer: 67 mL/min/{1.73_m2} (ref >59)
GFR, EST AFRICAN AMERICAN: 77 mL/min/{1.73_m2} (ref >59)
Glucose: 167 mg/dL — ABNORMAL HIGH (ref 65–99)
Potassium: 4.8 mmol/L (ref 3.5–5.2)
Sodium: 135 mmol/L (ref 134–144)

## 2017-08-12 LAB — CBC WITH DIFFERENTIAL/PLATELET
BASOS ABS: 0 10*3/uL (ref 0.0–0.2)
Basos: 0 %
EOS (ABSOLUTE): 0.1 10*3/uL (ref 0.0–0.4)
Eos: 1 %
Hematocrit: 40.3 % (ref 37.5–51.0)
Hemoglobin: 14.2 g/dL (ref 13.0–17.7)
Lymphocytes Absolute: 1.4 10*3/uL (ref 0.7–3.1)
Lymphs: 22 %
MCH: 31.8 pg (ref 26.6–33.0)
MCHC: 35.2 g/dL (ref 31.5–35.7)
MCV: 90 fL (ref 79–97)
Monocytes Absolute: 0.5 10*3/uL (ref 0.1–0.9)
Monocytes: 8 %
Neutrophils Absolute: 4.6 10*3/uL (ref 1.4–7.0)
Neutrophils: 69 %
Platelets: 191 10*3/uL (ref 150–379)
RBC: 4.47 x10E6/uL (ref 4.14–5.80)
RDW: 13 % (ref 12.3–15.4)
WBC: 6.6 10*3/uL (ref 3.4–10.8)

## 2017-08-12 LAB — PROTIME-INR
INR: 1.1 (ref 0.8–1.2)
Prothrombin Time: 11.4 s (ref 9.1–12.0)

## 2017-08-16 ENCOUNTER — Telehealth: Payer: Self-pay | Admitting: *Deleted

## 2017-08-16 NOTE — Telephone Encounter (Signed)
Pt contacted pre-catheterization scheduled at [facility] for: Thursday February 21,2019 10:30 AM Verified arrival time and place: [facility] Main Entrance A/[facility] at: 8AM Nothing to eat or drink after midnight.  HOLD: No Eliquis 08/16/17, 08/17/17,until after cath. No glipizide AM of cath No prandin AM of cath  Except hold medications AM meds can be  taken pre-cath with sip of water including: ASA 81 mg am of cath   Confirmed patient has responsible person to drive home post procedure and observe patient for 24 hours: yes

## 2017-08-18 ENCOUNTER — Ambulatory Visit (HOSPITAL_COMMUNITY)
Admission: RE | Disposition: A | Discharge status: Home / Self Care | Payer: Self-pay | Source: Ambulatory Visit | Attending: Internal Medicine

## 2017-08-18 ENCOUNTER — Ambulatory Visit (HOSPITAL_COMMUNITY)
Admission: RE | Admit: 2017-08-18 | Discharge: 2017-08-18 | Disposition: A | Discharge status: Home / Self Care | Payer: Medicare Other | Source: Ambulatory Visit | Attending: Internal Medicine | Admitting: Internal Medicine

## 2017-08-18 DIAGNOSIS — Z882 Allergy status to sulfonamides status: Secondary | ICD-10-CM | POA: Diagnosis not present

## 2017-08-18 DIAGNOSIS — Z79899 Other long term (current) drug therapy: Secondary | ICD-10-CM | POA: Diagnosis not present

## 2017-08-18 DIAGNOSIS — I5022 Chronic systolic (congestive) heart failure: Secondary | ICD-10-CM | POA: Diagnosis present

## 2017-08-18 DIAGNOSIS — Z7901 Long term (current) use of anticoagulants: Secondary | ICD-10-CM | POA: Insufficient documentation

## 2017-08-18 DIAGNOSIS — Z8673 Personal history of transient ischemic attack (TIA), and cerebral infarction without residual deficits: Secondary | ICD-10-CM | POA: Insufficient documentation

## 2017-08-18 DIAGNOSIS — I2510 Atherosclerotic heart disease of native coronary artery without angina pectoris: Secondary | ICD-10-CM

## 2017-08-18 DIAGNOSIS — I2582 Chronic total occlusion of coronary artery: Secondary | ICD-10-CM | POA: Diagnosis not present

## 2017-08-18 DIAGNOSIS — I110 Hypertensive heart disease with heart failure: Secondary | ICD-10-CM | POA: Insufficient documentation

## 2017-08-18 DIAGNOSIS — E119 Type 2 diabetes mellitus without complications: Secondary | ICD-10-CM | POA: Diagnosis not present

## 2017-08-18 DIAGNOSIS — Z880 Allergy status to penicillin: Secondary | ICD-10-CM | POA: Insufficient documentation

## 2017-08-18 DIAGNOSIS — I447 Left bundle-branch block, unspecified: Secondary | ICD-10-CM | POA: Insufficient documentation

## 2017-08-18 DIAGNOSIS — I480 Paroxysmal atrial fibrillation: Secondary | ICD-10-CM | POA: Insufficient documentation

## 2017-08-18 DIAGNOSIS — Z885 Allergy status to narcotic agent status: Secondary | ICD-10-CM | POA: Diagnosis not present

## 2017-08-18 DIAGNOSIS — I5042 Chronic combined systolic (congestive) and diastolic (congestive) heart failure: Secondary | ICD-10-CM | POA: Diagnosis not present

## 2017-08-18 DIAGNOSIS — E782 Mixed hyperlipidemia: Secondary | ICD-10-CM | POA: Insufficient documentation

## 2017-08-18 HISTORY — PX: RIGHT/LEFT HEART CATH AND CORONARY ANGIOGRAPHY: CATH118266

## 2017-08-18 LAB — POCT I-STAT 3, VENOUS BLOOD GAS (G3P V)
Acid-base deficit: 2 mmol/L (ref 0.0–2.0)
Acid-base deficit: 3 mmol/L — ABNORMAL HIGH (ref 0.0–2.0)
BICARBONATE: 23.4 mmol/L (ref 20.0–28.0)
Bicarbonate: 22.9 mmol/L (ref 20.0–28.0)
O2 Saturation: 71 %
O2 Saturation: 72 %
PCO2 VEN: 43 mmHg — AB (ref 44.0–60.0)
PH VEN: 7.328 (ref 7.250–7.430)
PH VEN: 7.343 (ref 7.250–7.430)
TCO2: 25 mmol/L (ref 22–32)
TCO2: 24 mmol/L (ref 22–32)
pCO2, Ven: 43.6 mmHg — ABNORMAL LOW (ref 44.0–60.0)
pO2, Ven: 40 mmHg (ref 32.0–45.0)
pO2, Ven: 40 mmHg (ref 32.0–45.0)

## 2017-08-18 LAB — POCT I-STAT 3, ART BLOOD GAS (G3+)
Acid-base deficit: 3 mmol/L — ABNORMAL HIGH (ref 0.0–2.0)
Bicarbonate: 21.2 mmol/L (ref 20.0–28.0)
O2 Saturation: 97 %
PH ART: 7.379 (ref 7.350–7.450)
PO2 ART: 91 mmHg (ref 83.0–108.0)
TCO2: 22 mmol/L (ref 22–32)
pCO2 arterial: 35.9 mmHg (ref 32.0–48.0)

## 2017-08-18 LAB — GLUCOSE, CAPILLARY: GLUCOSE-CAPILLARY: 165 mg/dL — AB (ref 65–99)

## 2017-08-18 SURGERY — RIGHT/LEFT HEART CATH AND CORONARY ANGIOGRAPHY
Anesthesia: LOCAL

## 2017-08-18 MED ORDER — IOPAMIDOL (ISOVUE-370) INJECTION 76%
INTRAVENOUS | Status: DC | PRN
  Administered 2017-08-18: 43 mL via INTRA_ARTERIAL

## 2017-08-18 MED ORDER — SODIUM CHLORIDE 0.9% FLUSH: 3.0000 mL | Freq: Two times a day (BID) | INTRAVENOUS | Status: DC

## 2017-08-18 MED ORDER — APIXABAN 5 MG PO TABS: 5.0000 mg | ORAL_TABLET | Freq: Two times a day (BID) | ORAL | 3 refills | Status: DC

## 2017-08-18 MED ORDER — HEPARIN (PORCINE) IN NACL 2-0.9 UNIT/ML-% IJ SOLN
INTRAMUSCULAR | Status: AC | PRN
  Administered 2017-08-18: 500 mL

## 2017-08-18 MED ORDER — SODIUM CHLORIDE 0.9 % IV SOLN: 250.0000 mL | INTRAVENOUS | Status: DC | PRN

## 2017-08-18 MED ORDER — IOPAMIDOL (ISOVUE-370) INJECTION 76%
INTRAVENOUS | Status: AC
  Filled 2017-08-18: qty 100

## 2017-08-18 MED ORDER — HEPARIN SODIUM (PORCINE) 1000 UNIT/ML IJ SOLN
INTRAMUSCULAR | Status: DC | PRN
  Administered 2017-08-18: 5000 [IU] via INTRAVENOUS

## 2017-08-18 MED ORDER — SODIUM CHLORIDE 0.9 % WEIGHT BASED INFUSION
3.0000 mL/kg/h | INTRAVENOUS | Status: AC
  Administered 2017-08-18: 3 mL/kg/h via INTRAVENOUS

## 2017-08-18 MED ORDER — MIDAZOLAM HCL 2 MG/2ML IJ SOLN
INTRAMUSCULAR | Status: DC | PRN
  Administered 2017-08-18: 1 mg via INTRAVENOUS
  Administered 2017-08-18: 2 mg via INTRAVENOUS

## 2017-08-18 MED ORDER — MIDAZOLAM HCL 2 MG/2ML IJ SOLN
INTRAMUSCULAR | Status: AC
Start: 2017-08-18 — End: 2017-08-18
  Filled 2017-08-18: qty 2

## 2017-08-18 MED ORDER — ASPIRIN 81 MG PO CHEW: 81.0000 mg | CHEWABLE_TABLET | ORAL | Status: DC

## 2017-08-18 MED ORDER — MIDAZOLAM HCL 2 MG/2ML IJ SOLN
INTRAMUSCULAR | Status: AC
  Filled 2017-08-18: qty 2

## 2017-08-18 MED ORDER — VERAPAMIL HCL 2.5 MG/ML IV SOLN
INTRAVENOUS | Status: DC | PRN
  Administered 2017-08-18 (×2): via INTRA_ARTERIAL

## 2017-08-18 MED ORDER — HYDROMORPHONE HCL 1 MG/ML IJ SOLN
INTRAMUSCULAR | Status: AC
  Filled 2017-08-18: qty 0.5

## 2017-08-18 MED ORDER — LIDOCAINE HCL (PF) 1 % IJ SOLN
INTRAMUSCULAR | Status: AC
  Filled 2017-08-18: qty 30

## 2017-08-18 MED ORDER — HEPARIN SODIUM (PORCINE) 1000 UNIT/ML IJ SOLN
INTRAMUSCULAR | Status: AC
  Filled 2017-08-18: qty 1

## 2017-08-18 MED ORDER — SODIUM CHLORIDE 0.9% FLUSH: 3.0000 mL | INTRAVENOUS | Status: DC | PRN

## 2017-08-18 MED ORDER — SODIUM CHLORIDE 0.9 % IV SOLN: INTRAVENOUS | Status: DC

## 2017-08-18 MED ORDER — LIDOCAINE HCL (PF) 1 % IJ SOLN
INTRAMUSCULAR | Status: DC | PRN
  Administered 2017-08-18 (×2): 2 mL

## 2017-08-18 MED ORDER — HYDROMORPHONE HCL 1 MG/ML IJ SOLN
INTRAMUSCULAR | Status: DC | PRN
  Administered 2017-08-18: 0.5 mg via INTRAVENOUS

## 2017-08-18 MED ORDER — SODIUM CHLORIDE 0.9 % WEIGHT BASED INFUSION: 1.0000 mL/kg/h | INTRAVENOUS | Status: DC

## 2017-08-18 MED ORDER — HEPARIN (PORCINE) IN NACL 2-0.9 UNIT/ML-% IJ SOLN
INTRAMUSCULAR | Status: AC
  Filled 2017-08-18: qty 1000

## 2017-08-18 MED ORDER — VERAPAMIL HCL 2.5 MG/ML IV SOLN
INTRAVENOUS | Status: AC
  Filled 2017-08-18: qty 2

## 2017-08-18 SURGICAL SUPPLY — 16 items
CATH BALLN WEDGE 5F 110CM (CATHETERS) ×2 IMPLANT
CATH IMPULSE 5F ANG/FL3.5 (CATHETERS) ×2 IMPLANT
CATH LAUNCHER 5F EBU3.0 (CATHETERS) ×1 IMPLANT
CATHETER LAUNCHER 5F EBU3.0 (CATHETERS) ×2
DEVICE RAD COMP TR BAND LRG (VASCULAR PRODUCTS) ×2 IMPLANT
ELECT DEFIB PAD ADLT CADENCE (PAD) ×2 IMPLANT
GLIDESHEATH SLEND SS 6F .021 (SHEATH) ×2 IMPLANT
GUIDEWIRE INQWIRE 1.5J.035X260 (WIRE) ×1 IMPLANT
INQWIRE 1.5J .035X260CM (WIRE) ×2
KIT PREMIUM HAND CONTROLLER (KITS) ×2 IMPLANT
KIT SINGLE USE MANIFOLD (KITS) ×2 IMPLANT
PACK CARDIAC CATHETERIZATION (CUSTOM PROCEDURE TRAY) ×2 IMPLANT
PROTECTION STATION PRESSURIZED (MISCELLANEOUS) ×2
SHEATH GLIDE SLENDER 4/5FR (SHEATH) ×2 IMPLANT
STATION PROTECTION PRESSURIZED (MISCELLANEOUS) ×1 IMPLANT
TRANSDUCER W/STOPCOCK (MISCELLANEOUS) ×2 IMPLANT

## 2017-08-18 NOTE — Brief Op Note (Signed)
BRIEF CARDIAC CATHETERIZATION NOTE  DATE: 08/18/2017 TIME: 11:21 AM  PATIENT:  Nathan [name]  72 y.o. male  PRE-OPERATIVE DIAGNOSIS:  Chronic systolic heart failure, cardiomyopathy, and coronary artery disease  POST-OPERATIVE DIAGNOSIS:  Same  PROCEDURE:  Procedure(s): RIGHT/LEFT HEART CATH AND CORONARY ANGIOGRAPHY (N/A)  SURGEON:  Surgeon(s) and Role:    * Dixon Luczak, MD - Primary  FINDINGS: 1. Multivessel CAD, including 50-60% distal LMCA and 40-50% proximal LAD stenosis, as well as chronic total occlusions of the mid LCx and mid RCA with collateralization of the distal vessels. 2. Normal left and right heart filling pressures. 3. Small right radial artery with high takeoff and significant vasospasm.  RECOMMENDATIONS: 1. Outpatient cardiac surgery consultation. 2. Aggressive secondary prevention and evidence-based heart failure therapy. 3. If no bleeding or evidence of vascular injury, apixaban can be restarted tomorrow.  Nathan Kendallhristopher Raydel Hosick, MD [facility] Pager: [phone]

## 2017-08-18 NOTE — Discharge Instructions (Signed)
Radial Site Care Refer to this sheet in the next few weeks. These instructions provide you with information about caring for yourself after your procedure. Your health care provider may also give you more specific instructions. Your treatment has been planned according to current medical practices, but problems sometimes occur. Call your health care provider if you have any problems or questions after your procedure. What can I expect after the procedure? After your procedure, it is typical to have the following:  Bruising at the radial site that usually fades within 1-2 weeks.  Blood collecting in the tissue (hematoma) that may be painful to the touch. It should usually decrease in size and tenderness within 1-2 weeks.  Follow these instructions at home:  Take medicines only as directed by your health care provider.  You may shower 24-48 hours after the procedure or as directed by your health care provider. Remove the bandage (dressing) and gently wash the site with plain soap and water. Pat the area dry with a clean towel. Do not rub the site, because this may cause bleeding.  Do not take baths, swim, or use a hot tub until your health care provider approves.  Check your insertion site every day for redness, swelling, or drainage.  Do not apply powder or lotion to the site.  Do not flex or bend the affected arm for 24 hours or as directed by your health care provider.  Do not push or pull heavy objects with the affected arm for 24 hours or as directed by your health care provider.  Do not lift over 10 lb (4.5 kg) for 5 days after your procedure or as directed by your health care provider.  Ask your health care provider when it is okay to: ? Return to work or school. ? Resume usual physical activities or sports. ? Resume sexual activity.  Do not drive home if you are discharged the same day as the procedure. Have someone else drive you.  You may drive 24 hours after the procedure  unless otherwise instructed by your health care provider.  Do not operate machinery or power tools for 24 hours after the procedure.  If your procedure was done as an outpatient procedure, which means that you went home the same day as your procedure, a responsible adult should be with you for the first 24 hours after you arrive home.  Keep all follow-up visits as directed by your health care provider. This is important. Contact a health care provider if:  You have a fever.  You have chills.  You have increased bleeding from the radial site. Hold pressure on the site. Get help right away if:  You have unusual pain at the radial site.  You have redness, warmth, or swelling at the radial site.  You have drainage (other than a small amount of blood on the dressing) from the radial site.  The radial site is bleeding, and the bleeding does not stop after 30 minutes of holding steady pressure on the site.  Your arm or hand becomes pale, cool, tingly, or numb. This information is not intended to replace advice given to you by your health care provider. Make sure you discuss any questions you have with your health care provider. Document Released: 07/17/2010 Document Revised: 11/20/2015 Document Reviewed: 12/31/2013 Elsevier Interactive Patient Education  [employer]2018 [employer] If you do not notice and bleeding or swelling at the right wrist or left elbow, you can restart Eliquis tomorrow morning (08/19/17). If any concerns  arise, please contact Dr. Hulen Shouts office of the [facility] cardiac cath lab.

## 2017-08-18 NOTE — Interval H&P Note (Signed)
History and Physical Interval Note:  08/18/2017 10:09 AM  Nathan [name]  has presented today for cardiac catheterization, with the diagnosis of chronic systolic heart failure, cardiomyopathy, and coronary artery disease.  The various methods of treatment have been discussed with the patient and family. After consideration of risks, benefits and other options for treatment, the patient has consented to  Procedure(s): RIGHT/LEFT HEART CATH AND CORONARY ANGIOGRAPHY (N/A) as a surgical intervention .  The patient's history has been reviewed, patient examined, no change in status, stable for surgery.  I have reviewed the patient's chart and labs.  Questions were answered to the patient's satisfaction.    Cath Lab Visit (complete for each Cath Lab visit)  Clinical Evaluation Leading to the Procedure:   ACS: No.  Non-ACS:    Anginal Classification: CCS II  Anti-ischemic medical therapy: Minimal Therapy (1 class of medications)  Non-Invasive Test Results: High-risk stress test findings: cardiac mortality >3%/year  Prior CABG: No previous CABG  Nathan [name]

## 2017-08-19 ENCOUNTER — Encounter (HOSPITAL_COMMUNITY): Payer: Self-pay | Admitting: Internal Medicine

## 2017-08-19 MED FILL — Heparin Sodium (Porcine) 2 Unit/ML in Sodium Chloride 0.9%: INTRAMUSCULAR | Qty: 500 | Status: AC

## 2017-08-31 ENCOUNTER — Other Ambulatory Visit: Payer: Self-pay

## 2017-08-31 ENCOUNTER — Telehealth: Payer: Self-pay | Admitting: Neurology

## 2017-08-31 ENCOUNTER — Telehealth: Payer: Self-pay | Admitting: Cardiology

## 2017-08-31 MED ORDER — APIXABAN 5 MG PO TABS: 5.0000 mg | ORAL_TABLET | Freq: Two times a day (BID) | ORAL | 3 refills | Status: DC

## 2017-08-31 NOTE — Telephone Encounter (Signed)
Patient requesting refill of apixaban (ELIQUIS) 5 MG TABS tablet. Patient is completely out of medication. Please call to [facility] on [address] in [place].

## 2017-08-31 NOTE — Telephone Encounter (Signed)
Refill done for 3 months per Dr. Roda ShuttersXu. Per Dr. Roda ShuttersXu pt needs to contact his cardiologist in June 2019 for ongoing refills. Pt sees his cardiologist regularly.

## 2017-08-31 NOTE — Telephone Encounter (Addendum)
Pt called back, he is anxious. Pt is aware the message has been sent to Provider and RN is waiting for recommendation. Please call to advise

## 2017-08-31 NOTE — Telephone Encounter (Signed)
[place] [facility] [place]

## 2017-08-31 NOTE — Telephone Encounter (Signed)
Refill sent.

## 2017-09-01 ENCOUNTER — Other Ambulatory Visit: Payer: Self-pay

## 2017-09-01 ENCOUNTER — Institutional Professional Consult (permissible substitution): Payer: Medicare Other | Admitting: Cardiothoracic Surgery

## 2017-09-01 ENCOUNTER — Encounter: Payer: Self-pay | Admitting: Cardiothoracic Surgery

## 2017-09-01 VITALS — BP 141/82 | HR 70 | Resp 18 | Ht 71.0 in | Wt 222.6 lb

## 2017-09-01 DIAGNOSIS — I2510 Atherosclerotic heart disease of native coronary artery without angina pectoris: Secondary | ICD-10-CM | POA: Diagnosis not present

## 2017-09-01 NOTE — Progress Notes (Signed)
PCP is [name], MD Referring Provider is End, [name], MD  Chief Complaint  Patient presents with  . Coronary Artery Disease    New pt, evaluation for CABG Cath 08/18/2017    HPI: Patient examined, recent coronary angiogram and transthoracic echocardiogram images personally reviewed and discussed with patient.  72 year old diabetic presents for recently diagnosed severe three-vessel CAD with ischemic cardiomyopathy EF 35%, moderate-severe ischemic mitral regurgitation and atrial fibrillation which caused an embolic right cerebral CVA treated with IR clot extraction and reversal of left-sided weakness November 2018.  Patient currently on Eliquis in rate controlled atrial fibrillation.  Following recovery from his stroke he underwent left and right heart catheterization by Dr. and which demonstrated severe three-vessel coronary disease with a diabetic pattern with chronic occlusion the RCA and 70% left main stenosis.  PA pressures and cardiac output were normal.  Patient currently is asymptomatic of chest pain or shortness of breath and is recovering well from the left-sided weakness of the stroke but still has short-term memory issues.  Patient is unsure of how long he has had atrial fibrillation.  He believes he had a heart murmur when he was evaluated for arm services but not sure.  The patient's coronary vessels appear to be graftable.  The mitral regurgitation appears to be from ischemic etiology with eccentric jet going posteriorly.  The transthoracic images are suboptimal and the patient will need transesophageal echo evaluation of his mitral valve prior to planning surgery and discussing operative risk.  This will be [facility] up at [facility]. Patient has chronic poor dental hygiene with missing in necrotic appearing teeth and will need dental evaluation prior to elective valve surgery.  This will be arranged.  With the dental clinic in [place].  Patient appears to be acceptable risk for  sternotomy and combined CABG and mitral valve repair-replacement.  He stopped smoking but did smoke for 30 years and will require PFTs at a later date. Past Medical History:  Diagnosis Date  . Atrial fibrillation with RVR (HCC) 05/01/2017  . Cardiomyopathy (HCC) 05/01/2017  . CHF (congestive heart failure) (HCC)   . 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 DM (diabetes mellitus) type 2, uncontrolled, with ketoacidosis (HCC) 05/01/2017  . Essential hypertension 11/26/2016  . HLD (hyperlipidemia) 05/01/2017  . IVCD (intraventricular conduction defect) 04/24/2015  . Mixed dyslipidemia 11/26/2016  . 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 IR PERCUTANEOUS ART THROMBECTOMY/INFUSION INTRACRANIAL INC DIAG ANGIO  04/29/2017  . IR RADIOLOGIST EVAL & MGMT  05/17/2017  . IR US GUIDE VASC ACCESS LEFT  04/29/2017  . IR US GUIDE VASC ACCESS RIGHT  04/29/2017  . RADIOLOGY WITH ANESTHESIA N/A 04/29/2017   Procedure: IR WITH ANESTHESIA;  Surgeon: Radiologist, Medication, MD;  Location: [facility] OR;  Service: Radiology;  Laterality: N/A;  . RIGHT/LEFT HEART CATH AND CORONARY ANGIOGRAPHY N/A 08/18/2017   Procedure: RIGHT/LEFT HEART CATH AND CORONARY ANGIOGRAPHY;  Surgeon: [name], MD;  Location: [facility] INVASIVE CV LAB;  Service: Cardiovascular;  Laterality: N/A;    Family History  Problem Relation Age of Onset  . Hypertension Father   . Diabetes Father     Social History Social History   Tobacco Use  . Smoking status: Former Smoker    Last attempt to quit: 1994    Years since quitting: 25.1  . Smokeless tobacco: Never Used  Substance Use Topics  . Alcohol use: Yes    Alcohol/week: 1.8  oz    Types: 1 Glasses of wine, 1 Cans of beer, 1 Standard drinks or equivalent per week    Comment: mix drink  . Drug use: No    Current Outpatient Medications  Medication Sig Dispense Refill  . amiodarone (PACERONE) 200 MG tablet Take  1 tablet (200 mg total) by mouth daily. 90 tablet 3  . apixaban (ELIQUIS) 5 MG TABS tablet Take 1 tablet (5 mg total) by mouth 2 (two) times daily. 180 tablet 3  . carvedilol (COREG) 6.25 MG tablet Take 1 tablet (6.25 mg total) by mouth 2 (two) times daily. 180 tablet 3  . cyclobenzaprine (FLEXERIL) 10 MG tablet Take 10 mg by mouth 2 (two) times daily as needed.  0  . glipiZIDE (GLUCOTROL XL) 10 MG 24 hr tablet Take 10 mg by mouth daily with breakfast.   3  . lisinopril (PRINIVIL,ZESTRIL) 5 MG tablet Take 2.5 mg by mouth daily.    . meloxicam (MOBIC) 15 MG tablet Take 15 mg by mouth daily as needed for pain.    . naproxen sodium (ALEVE) 220 MG tablet Take 220 mg by mouth daily as needed (pain).    . repaglinide (PRANDIN) 0.5 MG tablet Take 1 tablet by mouth 3 (three) times daily with meals.   3  . simvastatin (ZOCOR) 40 MG tablet Take 0.5 tablets (20 mg total) by mouth at bedtime. 30 tablet 2  . tamsulosin (FLOMAX) 0.4 MG CAPS capsule Take 0.4 mg by mouth at bedtime.   3   No current facility-administered medications for this visit.     Allergies  Allergen Reactions  . Fentanyl Shortness Of Breath  . Promethazine Hcl Other (See Comments)    Cardiac arrest  . Metformin And Related Diarrhea  . Penicillins Nausea Only    Has patient had a PCN reaction causing immediate rash, facial/tongue/throat swelling, SOB or lightheadedness with hypotension: no Has patient had a PCN reaction causing severe rash involving mucus membranes or skin necrosis: no Has patient had a PCN reaction that required hospitalization: no Has patient had a PCN reaction occurring within the last 10 years: yes If all of the above answers are "NO", then may proceed with Cephalosporin use.   . Sulfa Antibiotics Other (See Comments)    G.I. Upset  . Foeniculum Vulgare Other (See Comments)    Fennel bulbs    Review of Systems   Weight stable No edema Shortness of breath with exertion Left-sided weakness improved  short-term memory still an issue No bleeding complications from Eliquis No history of GI surgery Positive history of kidney stones. Right chest wall surgery when he was a child but no intrathoracic surgery performed Left hand dominant   BP (!) 141/82 (BP Location: Left Arm, Patient Position: Sitting, Cuff Size: Large)   Pulse 70   Resp 18   Ht 5\' 11"  (1.803 m)   Wt 222 lb 9.6 oz (101 kg)   SpO2 98% Comment: RA  BMI 31.05 kg/m  Physical Exam      Exam    General- alert and comfortable no distress    Neck- no JVD, no cervical adenopathy palpable, no carotid bruit   Lungs- clear without rales, wheezes   Cor- irregular rate and rhythm, 3/6 holosystolic mitral regurg murmur ,  No gallop   Abdomen- soft, non-tender   Extremities - warm, non-tender, minimal edema   Neuro- oriented, appropriate, no focal weakness   Diagnostic Tests: Angiograms, echocardiogram, chest x-ray all personally reviewed and discussed with  patient  Impression: 72 year old diabetic recovering from a right MCA stroke which was treated with thrombus extraction November 2018.  He is currently diagnosed with ischemic diabetic cardiomyopathy three-vessel disease with EF of 35%, ischemic mitral regurgitation, and history of probable chronic atrial fibrillation.  His symptoms at this point are minimal.  However with his poor LV function surgery would be indicated for his CAD and mitral regurgitation.  He is willing to discuss surgery but not convinced he needs it.  We will proceed with a transesophageal echocardiogram evaluation of his mitral regurgitation and refer the patient to the dental clinic at [facility] for Dr. Robin Searing who returned back later this month.  To review progress and discuss surgical options.  Plan: Continue current medications.  Complete above studies and then return for further discussion   Mikey Bussing, MD [facility] [phone]

## 2017-09-02 ENCOUNTER — Telehealth: Payer: Self-pay | Admitting: Internal Medicine

## 2017-09-06 ENCOUNTER — Telehealth (HOSPITAL_COMMUNITY): Payer: Self-pay | Admitting: Dentistry

## 2017-09-06 NOTE — Telephone Encounter (Signed)
Patient scheduled for TEE 09/22/2017 @ 8:00am.. Thank you

## 2017-09-06 NOTE — Telephone Encounter (Signed)
Your physician has requested that you have a TEE. During a TEE, sound waves are used to create images of your heart. It provides your doctor with information about the size and shape of your heart and how well your heart's chambers and valves are working. In this test, a transducer is attached to the end of a flexible tube that's guided down your throat and into your esophagus (the tube leading from you mouth to your stomach) to get a more detailed image of your heart. You are not awake for the procedure. Please see the instruction sheet given to you today. For further information please visit [email or web address].   Dear Nathan [name] You are scheduled for a TEE on 09/22/17 with Dr. Jens Somrenshaw.  Please arrive at the [facility] (Main Entrance A) at [facility]: [address] [place], [place] [address] at 6:30 am. (1.5 hours prior to procedure)  DIET: Nothing to eat or drink after midnight except a sip of water with medications (see medication instructions below)  Medication Instructions:HOLD Diabetic medications  Continue your anticoagulant: Eliquis  You will need to continue your anticoagulant after your procedure until you  are told by your  Provider that it is safe to stop   You must have a responsible person to drive you home and stay in the waiting area during your procedure. Failure to do so could result in cancellation.  Bring your insurance cards.  *Special Note: Every effort is made to have your procedure done on time. Occasionally there are emergencies that occur at the hospital that may cause delays. Please be patient if a delay does occur.

## 2017-09-06 NOTE — Telephone Encounter (Signed)
09/06/2017  Patient:            Nathan [name] Date of Birth:  27-Nov-1945 MRN:                [id number]   Multiple attempts have been made to contact patient and schedule a dental consultation appointment. Unable to leave message on machine.  Dr. Kristin BruinsKulinski

## 2017-09-06 NOTE — Telephone Encounter (Signed)
Spoke with patient's wife in regards to husband's TEE to be done in the next couple of weeks..  We will finalize plans on 3/13..[name]

## 2017-09-06 NOTE — Telephone Encounter (Signed)
Mr. Sande BrothersFerree is scheduled to follow up with Dr. Donata ClayVan Trigt on 3/27; it would be most helpful if TEE could be performed before this appointment. Any chance we could move the TEE up? Thanks.  Yvonne Kendallhristopher Areliz Rothman, MD [facility] Pager: [phone]

## 2017-09-06 NOTE — Telephone Encounter (Signed)
Left patient's wife a message on her phone.Nathan [name].  Cannot reach patient..[name]

## 2017-09-06 NOTE — Telephone Encounter (Signed)
I have attempted to reach Mr. Sande BrothersFerree by phone multiple times to make arrangements for TEE at Dr. Zenaida NieceVan Trigt's request. Voicemail left last week has not been returned. No answer today on either number listed in Epic. I will have our office attempt to reach Mr. Sande BrothersFerree again to facilitate TEE.  Yvonne Kendallhristopher Nicholaus Steinke, MD [facility] Pager: [phone]

## 2017-09-06 NOTE — Telephone Encounter (Signed)
Will definitely move it up to the previous week..  Thank you

## 2017-09-07 NOTE — Telephone Encounter (Signed)
Patient still having trouble with scheduling TEE..They will talk to Dr Alla GermanVantright and will let us know the day that they are available to have the test..  Patient's wife attends school and works so we need to work with that..[name]

## 2017-09-07 NOTE — Telephone Encounter (Signed)
Spoke with patient and scheduling.. The appt is now scheduled 3/22 @ 9:00.. Thank you

## 2017-09-07 NOTE — Telephone Encounter (Signed)
We have rescheduled TEE for 3/28 @ 0800 as patient requested..  They rescheduled appt with Dr. Alla GermanVantright and wanted this adjusted..[name]

## 2017-09-07 NOTE — Telephone Encounter (Signed)
Follow up    Patient is calling back in reference to the appt on 09/16/2017. Please call to discuss.

## 2017-09-07 NOTE — Telephone Encounter (Signed)
Ok. It would probably be best for them to figure out a date/time that works best with cardiac surgery. Maybe the appointment with Dr. Donata ClayVan Trigt will need to be pushed back. I will defer this to his office and copy Alycia RossettiRyan on this message.

## 2017-09-08 ENCOUNTER — Other Ambulatory Visit (HOSPITAL_COMMUNITY): Payer: Medicare Other | Admitting: Dentistry

## 2017-09-13 ENCOUNTER — Telehealth: Payer: Self-pay | Admitting: Cardiology

## 2017-09-13 NOTE — Telephone Encounter (Signed)
Attempted to contact patient on home number, line busy. Attempted to contact patient on mobile number, no voicemail.

## 2017-09-13 NOTE — Telephone Encounter (Signed)
Attempted to contact wife, phone rings once then stops. Contacted son per DPR. Son will have patient call office.

## 2017-09-13 NOTE — Telephone Encounter (Signed)
Patient wanted to clarify medication instructions for TEE. States he called Dr. Serita KyleEnd's office and was advised to call Dr. Hulen ShoutsMunley's office. Clarified that patient is to take all medications that morning including Eliquis except for diabetic medications. Patient states that he was confused on if he was supposed to see a dentist before the TEE. Dr. Serita KyleEnd's nurse is out of the office today. Advised would consult with the nurse tomorrow and have him call the patient to clarify dental instructions. Patient verbalized understanding. No further questions.

## 2017-09-13 NOTE — Telephone Encounter (Signed)
States you're supposed to give him some instructions

## 2017-09-20 ENCOUNTER — Telehealth (HOSPITAL_COMMUNITY): Payer: Self-pay

## 2017-09-21 ENCOUNTER — Other Ambulatory Visit: Payer: Self-pay | Admitting: Internal Medicine

## 2017-09-21 ENCOUNTER — Telehealth: Payer: Self-pay | Admitting: Internal Medicine

## 2017-09-21 ENCOUNTER — Encounter: Payer: Medicare Other | Admitting: Cardiothoracic Surgery

## 2017-09-21 DIAGNOSIS — I340 Nonrheumatic mitral (valve) insufficiency: Secondary | ICD-10-CM

## 2017-09-21 NOTE — Telephone Encounter (Signed)
Progress Note  Patient Name: Nathan [name] Date of Encounter: 09/21/2017  Primary Cardiologist: Norman Herrlich, MD  Subjective   Mr. Ramseyer reports feeling well over since his cardiac catheterization last month. He has noted some labile blood pressures and was recently restarted on lisinopril. He has not had any significant chest pain or shortness of breath. He was evaluated by Dr. Donata Clay on 09/01/17, who recommended CABG and potential mitral valve intervention, given three-vessel CAD and LVEF of 35%. We have been asked to assist with TEE to further characterize the degree of mitral regurgitation.  Outpatient Medications    Current Outpatient Medications on File Prior to Visit  Medication Sig Dispense Refill  . amiodarone (PACERONE) 200 MG tablet Take 1 tablet (200 mg total) by mouth daily. 90 tablet 3  . apixaban (ELIQUIS) 5 MG TABS tablet Take 1 tablet (5 mg total) by mouth 2 (two) times daily. 180 tablet 3  . carvedilol (COREG) 6.25 MG tablet Take 1 tablet (6.25 mg total) by mouth 2 (two) times daily. 180 tablet 3  . cyclobenzaprine (FLEXERIL) 10 MG tablet Take 10 mg by mouth 2 (two) times daily as needed.  0  . glipiZIDE (GLUCOTROL XL) 10 MG 24 hr tablet Take 10 mg by mouth daily with breakfast.   3  . lisinopril (PRINIVIL,ZESTRIL) 5 MG tablet Take 2.5 mg by mouth daily.    . meloxicam (MOBIC) 15 MG tablet Take 15 mg by mouth daily as needed for pain.    . naproxen sodium (ALEVE) 220 MG tablet Take 220 mg by mouth daily as needed (pain).    . repaglinide (PRANDIN) 0.5 MG tablet Take 1 tablet by mouth 3 (three) times daily with meals.   3  . simvastatin (ZOCOR) 40 MG tablet Take 0.5 tablets (20 mg total) by mouth at bedtime. 30 tablet 2  . tamsulosin (FLOMAX) 0.4 MG CAPS capsule Take 0.4 mg by mouth at bedtime.   3   No current facility-administered medications on file prior to visit.     Cardiac Studies   L/RHC (08/18/17): Significant multivessel coronary artery disease,  including 50-60% distal LMCA lesion, 50% proximal LAD stenosis, subtotal occlusion of mid LCx followed by 70% OM3 stenosis, and chronic total occlusion of mid RCA. 1. Normal left heart, right heart, and pulmonary artery pressures. 2. Normal Fick cardiac output/index. 3. Small right radial artery with high takeoff precluding left heart catheterization and guide catheter advancement to allow for FFR of LMCA/LAD disease. 4. Small right radial artery with high takeoff. Consider alternative access for future catheterizations.  TTE (04/29/17): Mildly dilated LV with mild LV hypertrophy. EF 30-35%.  Inferolateral akinesis, anterolateral severe hypokinesis. Normal RV size and systolic function. At least moderate, eccentric posteriorly-directed mitral regurgitation. Wall motion abnormalities raise concern for infarct-related MR.  Patient Profile     72 y.o. male man with history of multivessel CAD, chronic systolic heart failure due to ischemic cardiomyopathy, chronic atrial fibrillation, stroke, hypertension, hyperlipidemia, and type 2 diabetes mellitus, referred for TEE to better characterize mitral valve disease in anticipation of CABG and possible mitral valve intervention.  Assessment & Plan    Mitral regurgitation Noted to be at least moderate, eccentric regurgitation by echo in 04/2017 in the setting of moderate to severely reduced LVEF. Dr. Donata Clay has requested a transesophageal echocardiogram to better characterize the nature and severity of the mitral regurgitation to assist with operative planning. I spoke with Mr. Zeiner by phone and explained the rationale for  the procedure, as well as its risks and benefits. He is agreeable to proceed as planned tomorrow morning. I will defer continued management to Dr. Donata ClayVan Trigt and Dr. Dulce SellarMunley, the patient's primary cardiologist.  Coronary artery disease and ischemic cardiomyopathy Mr. Sande BrothersFerree reports stable symptoms. Continue current medications and  follow-up with Drs. Donata ClayVan Trigt and [place].    Signed, Yvonne Kendallhristopher Rudolpho Claxton, MD  09/21/2017, 5:37 PM

## 2017-09-22 ENCOUNTER — Ambulatory Visit (HOSPITAL_COMMUNITY)
Admission: RE | Admit: 2017-09-22 | Discharge: 2017-09-22 | Disposition: A | Discharge status: Home / Self Care | Payer: Medicare Other | Source: Ambulatory Visit | Attending: Cardiology | Admitting: Cardiology

## 2017-09-22 ENCOUNTER — Encounter (HOSPITAL_COMMUNITY)
Admission: RE | Disposition: A | Discharge status: Home / Self Care | Payer: Self-pay | Source: Ambulatory Visit | Attending: Cardiology

## 2017-09-22 ENCOUNTER — Encounter (HOSPITAL_COMMUNITY): Payer: Self-pay

## 2017-09-22 ENCOUNTER — Ambulatory Visit (HOSPITAL_BASED_OUTPATIENT_CLINIC_OR_DEPARTMENT_OTHER): Payer: Medicare Other

## 2017-09-22 ENCOUNTER — Other Ambulatory Visit: Payer: Self-pay

## 2017-09-22 DIAGNOSIS — I5042 Chronic combined systolic (congestive) and diastolic (congestive) heart failure: Secondary | ICD-10-CM | POA: Insufficient documentation

## 2017-09-22 DIAGNOSIS — I2510 Atherosclerotic heart disease of native coronary artery without angina pectoris: Secondary | ICD-10-CM | POA: Diagnosis not present

## 2017-09-22 DIAGNOSIS — R29898 Other symptoms and signs involving the musculoskeletal system: Secondary | ICD-10-CM | POA: Insufficient documentation

## 2017-09-22 DIAGNOSIS — Z885 Allergy status to narcotic agent status: Secondary | ICD-10-CM | POA: Diagnosis not present

## 2017-09-22 DIAGNOSIS — Z7901 Long term (current) use of anticoagulants: Secondary | ICD-10-CM | POA: Diagnosis not present

## 2017-09-22 DIAGNOSIS — Z882 Allergy status to sulfonamides status: Secondary | ICD-10-CM | POA: Diagnosis not present

## 2017-09-22 DIAGNOSIS — E119 Type 2 diabetes mellitus without complications: Secondary | ICD-10-CM | POA: Insufficient documentation

## 2017-09-22 DIAGNOSIS — Z7984 Long term (current) use of oral hypoglycemic drugs: Secondary | ICD-10-CM | POA: Insufficient documentation

## 2017-09-22 DIAGNOSIS — I340 Nonrheumatic mitral (valve) insufficiency: Secondary | ICD-10-CM

## 2017-09-22 DIAGNOSIS — Z87891 Personal history of nicotine dependence: Secondary | ICD-10-CM | POA: Insufficient documentation

## 2017-09-22 DIAGNOSIS — Z79899 Other long term (current) drug therapy: Secondary | ICD-10-CM | POA: Diagnosis not present

## 2017-09-22 DIAGNOSIS — I4891 Unspecified atrial fibrillation: Secondary | ICD-10-CM | POA: Insufficient documentation

## 2017-09-22 DIAGNOSIS — Z880 Allergy status to penicillin: Secondary | ICD-10-CM | POA: Insufficient documentation

## 2017-09-22 DIAGNOSIS — E782 Mixed hyperlipidemia: Secondary | ICD-10-CM | POA: Insufficient documentation

## 2017-09-22 DIAGNOSIS — I255 Ischemic cardiomyopathy: Secondary | ICD-10-CM | POA: Insufficient documentation

## 2017-09-22 DIAGNOSIS — I110 Hypertensive heart disease with heart failure: Secondary | ICD-10-CM | POA: Diagnosis not present

## 2017-09-22 DIAGNOSIS — Z8673 Personal history of transient ischemic attack (TIA), and cerebral infarction without residual deficits: Secondary | ICD-10-CM | POA: Diagnosis not present

## 2017-09-22 HISTORY — PX: TEE WITHOUT CARDIOVERSION: SHX5443

## 2017-09-22 LAB — GLUCOSE, CAPILLARY: Glucose-Capillary: 81 mg/dL (ref 65–99)

## 2017-09-22 SURGERY — ECHOCARDIOGRAM, TRANSESOPHAGEAL
Anesthesia: Moderate Sedation

## 2017-09-22 MED ORDER — DIPHENHYDRAMINE HCL 50 MG/ML IJ SOLN
INTRAMUSCULAR | Status: DC | PRN
  Administered 2017-09-22: 25 mg via INTRAVENOUS

## 2017-09-22 MED ORDER — BUTAMBEN-TETRACAINE-BENZOCAINE 2-2-14 % EX AERO
INHALATION_SPRAY | CUTANEOUS | Status: DC | PRN
  Administered 2017-09-22: 2 via TOPICAL

## 2017-09-22 MED ORDER — MIDAZOLAM HCL 10 MG/2ML IJ SOLN
INTRAMUSCULAR | Status: DC | PRN
  Administered 2017-09-22: 2 mg via INTRAVENOUS
  Administered 2017-09-22: 1 mg via INTRAVENOUS

## 2017-09-22 MED ORDER — SODIUM CHLORIDE 0.9 % IV SOLN
INTRAVENOUS | Status: DC
  Administered 2017-09-22: 500 mL via INTRAVENOUS

## 2017-09-22 MED ORDER — MIDAZOLAM HCL 5 MG/ML IJ SOLN
INTRAMUSCULAR | Status: AC
  Filled 2017-09-22: qty 2

## 2017-09-22 MED ORDER — DIPHENHYDRAMINE HCL 50 MG/ML IJ SOLN
INTRAMUSCULAR | Status: AC
  Filled 2017-09-22: qty 1

## 2017-09-22 NOTE — Discharge Instructions (Signed)
Transesophageal Echocardiogram  Transesophageal echocardiography (TEE) is a special type of test that produces images of the heart by using sound waves (echocardiogram). This type of echocardiography can obtain better images of the heart than standard echocardiography. TEE is done by passing a flexible tube down the esophagus. The heart is located in front of the esophagus. Because the heart and esophagus are close to one another, your health care provider can take very clear, detailed pictures of the heart via ultrasound waves.  TEE may be done:  · If your health care provider needs more information based on standard echocardiography findings.  · If you had a stroke. This might have happened because a clot formed in your heart. TEE can visualize different areas of the heart and check for clots.  · To check valve anatomy and function.  · To check for infection on the inside of your heart (endocarditis).  · To evaluate the dividing wall (septum) of the heart and presence of a hole that did not close after birth (patent foramen ovale or atrial septal defect).  · To help diagnose a tear in the wall of the aorta (aortic dissection).  · During cardiac valve surgery. This allows the surgeon to assess the valve repair before closing the chest.  · During a variety of other cardiac procedures to guide positioning of catheters.  · Sometimes before a cardioversion, which is a shock to convert heart rhythm back to normal.    Tell a health care provider about:  · Any allergies you have.  · All medicines you are taking, including vitamins, herbs, eye drops, creams, and over-the-counter medicines.  · Any problems you or family members have had with anesthetic medicines.  · Any blood disorders you have.  · Any surgeries you have had.  · Any medical conditions you have.  · Swallowing difficulties.  · An esophageal obstruction.  What are the risks?  Generally, TEE is a safe procedure. However, as with any procedure, complications can  occur. Possible complications include an esophageal tear (rupture).  What happens before the procedure?  · Do not eat or drink for 6 hours before the procedure or as directed by your health care provider.  · Arrange for someone to drive you home after the procedure. Do not drive yourself home. During the procedure, you will be given medicines that can continue to make you feel drowsy and can impair your reflexes.  · An IV access tube will be started in the arm.  What happens during the procedure?  · A medicine to help you relax (sedative) will be given through the IV access tube.  · A medicine may be sprayed or gargled to numb the back of the throat.  · Your blood pressure, heart rate, and breathing (vital signs) will be monitored during the procedure.  · The TEE probe is a long, flexible tube. The tip of the probe is placed into the back of the mouth, and you will be asked to swallow. This helps to pass the tip of the probe into the esophagus. Once the tip of the probe is in the correct area, your health care provider can take pictures of the heart.  · TEE is usually not a painful procedure. You may feel the probe press against the back of the throat. The probe does not enter the trachea and does not affect your breathing.  What happens after the procedure?  · You will be in bed, resting, until you have fully returned to   consciousness.  · When you first awaken, your throat may feel slightly sore and will probably still feel numb. This will improve slowly over time.  · You will not be allowed to eat or drink until it is clear that the numbness has improved.  · Once you have been able to drink, urinate, and sit on the edge of the bed without feeling sick to your stomach (nausea) or dizzy, you may be cleared to go home.  · You should have a friend or family member with you for the next 24 hours after your procedure.  This information is not intended to replace advice given to you by your health care provider. Make  sure you discuss any questions you have with your health care provider.  Document Released: 09/04/2002 Document Revised: 11/20/2015 Document Reviewed: 12/14/2012  Elsevier Interactive Patient Education © 2018 [employer]

## 2017-09-22 NOTE — CV Procedure (Signed)
    Transesophageal Echocardiogram Note  Nathan [name] Nathan [name] [id number] 1945-12-01  Procedure: Transesophageal Echocardiogram Indications: Mitral regurgitation  Procedure Details Consent: Obtained Time Out: Verified patient identification, verified procedure, site/side was marked, verified correct patient position, special equipment/implants available, Radiology Safety Procedures followed,  medications/allergies/relevent history reviewed, required imaging and test results available.  Performed  Medications:  During this procedure the patient is administered a total of Versed 3 mg and benadryl 25 mg IV to achieve and maintain moderate conscious sedation.  The patient's heart rate, blood pressure, and oxygen saturation are monitored continuously during the procedure. The period of conscious sedation is 30 minutes, of which I was present face-to-face 100% of this time.  Akinesis of inferolateral wall with remaining walls hypokinetic; EF 30; restricted posterior MV leaflet with severe MR.   Complications: No apparent complications Patient did tolerate procedure well.  Olga MillersBrian Zymire Turnbo, MD

## 2017-09-22 NOTE — H&P (Signed)
Surgical Consult   09/01/2017 [facility] Ernesta Amble, MD    Cardiothoracic Surgery   CAD in native artery    Dx   Coronary Artery Disease   ; Referred by End, [name], MD    Reason for Visit     Additional Documentation   Vitals:   BP 141/82 (BP Location: Left Arm, Patient Position: Sitting, Cuff Size: Large)    Pulse 70    Resp 18    Ht 5\' 11"  (1.803 m)    Wt 222 lb 9.6 oz (101 kg)    SpO2 98%     BMI 31.05 kg/m    BSA 2.25 m        More Vitals    Flowsheets:   MEWS Score,    Anthropometrics,    Vital Signs      Encounter Info:   Billing Info,    History,    Allergies,    Detailed Report       All Notes    Progress Notes by Kerin Perna, MD at 09/01/2017 4:00 PM   Author: Kerin Perna, MD Author Type: Physician Filed: 09/01/2017 6:09 PM  Note Status: Signed Cosign: Cosign Not Required Encounter Date: 09/01/2017  Editor: Kerin Perna, MD (Physician)    PCP is [name], MD Referring Provider is End, [name], MD      Chief Complaint  Patient presents with  . Coronary Artery Disease    New pt, evaluation for CABG Cath 08/18/2017    HPI: Patient examined, recent coronary angiogram and transthoracic echocardiogram images personally reviewed and discussed with patient.  72 year old diabetic presents for recently diagnosed severe three-vessel CAD with ischemic cardiomyopathy EF 35%, moderate-severe ischemic mitral regurgitation and atrial fibrillation which caused an embolic right cerebral CVA treated with IR clot extraction and reversal of left-sided weakness November 2018.  Patient currently on Eliquis in rate controlled atrial fibrillation.  Following recovery from his stroke he underwent left and right heart catheterization by Dr. and which demonstrated severe three-vessel coronary disease with a diabetic pattern with chronic occlusion the RCA and 70% left  main stenosis.  PA pressures and cardiac output were normal.  Patient currently is asymptomatic of chest pain or shortness of breath and is recovering well from the left-sided weakness of the stroke but still has short-term memory issues.  Patient is unsure of how long he has had atrial fibrillation.  He believes he had a heart murmur when he was evaluated for arm services but not sure.  The patient's coronary vessels appear to be graftable.  The mitral regurgitation appears to be from ischemic etiology with eccentric jet going posteriorly.  The transthoracic images are suboptimal and the patient will need transesophageal echo evaluation of his mitral valve prior to planning surgery and discussing operative risk.  This will be [facility] up at [facility]. Patient has chronic poor dental hygiene with missing in necrotic appearing teeth and will need dental evaluation prior to elective valve surgery.  This will be arranged.  With the dental clinic in [place].  Patient appears to be acceptable risk for sternotomy and combined CABG and mitral valve repair-replacement.  He stopped smoking but did smoke for 30 years and will require PFTs at a later date.     Past Medical History:  Diagnosis Date  . Atrial fibrillation with RVR (HCC) 05/01/2017  . Cardiomyopathy (HCC) 05/01/2017  . CHF (congestive heart failure) (HCC)   . Chronic  combined systolic and diastolic heart failure (HCC) 04/24/2015  . DM (diabetes mellitus) type 2, uncontrolled, with ketoacidosis (HCC) 05/01/2017  . Essential hypertension 11/26/2016  . HLD (hyperlipidemia) 05/01/2017  . IVCD (intraventricular conduction defect) 04/24/2015  . Mixed dyslipidemia 11/26/2016  . Stroke (cerebrum) (HCC) 04/29/2017         Past Surgical History:  Procedure Laterality Date  . IR PERCUTANEOUS ART THROMBECTOMY/INFUSION INTRACRANIAL INC DIAG ANGIO  04/29/2017  . IR RADIOLOGIST EVAL & MGMT  05/17/2017  . IR US GUIDE VASC ACCESS LEFT  04/29/2017  . IR US GUIDE  VASC ACCESS RIGHT  04/29/2017  . RADIOLOGY WITH ANESTHESIA N/A 04/29/2017   Procedure: IR WITH ANESTHESIA;  Surgeon: Radiologist, Medication, MD;  Location: [facility] OR;  Service: Radiology;  Laterality: N/A;  . RIGHT/LEFT HEART CATH AND CORONARY ANGIOGRAPHY N/A 08/18/2017   Procedure: RIGHT/LEFT HEART CATH AND CORONARY ANGIOGRAPHY;  Surgeon: [name], MD;  Location: [facility] INVASIVE CV LAB;  Service: Cardiovascular;  Laterality: N/A;         Family History  Problem Relation Age of Onset  . Hypertension Father   . Diabetes Father     Social History Social History        Tobacco Use  . Smoking status: Former Smoker    Last attempt to quit: 1994    Years since quitting: 25.1  . Smokeless tobacco: Never Used  Substance Use Topics  . Alcohol use: Yes    Alcohol/week: 1.8 oz    Types: 1 Glasses of wine, 1 Cans of beer, 1 Standard drinks or equivalent per week    Comment: mix drink  . Drug use: No          Current Outpatient Medications  Medication Sig Dispense Refill  . amiodarone (PACERONE) 200 MG tablet Take 1 tablet (200 mg total) by mouth daily. 90 tablet 3  . apixaban (ELIQUIS) 5 MG TABS tablet Take 1 tablet (5 mg total) by mouth 2 (two) times daily. 180 tablet 3  . carvedilol (COREG) 6.25 MG tablet Take 1 tablet (6.25 mg total) by mouth 2 (two) times daily. 180 tablet 3  . cyclobenzaprine (FLEXERIL) 10 MG tablet Take 10 mg by mouth 2 (two) times daily as needed.  0  . glipiZIDE (GLUCOTROL XL) 10 MG 24 hr tablet Take 10 mg by mouth daily with breakfast.   3  . lisinopril (PRINIVIL,ZESTRIL) 5 MG tablet Take 2.5 mg by mouth daily.    . meloxicam (MOBIC) 15 MG tablet Take 15 mg by mouth daily as needed for pain.    . naproxen sodium (ALEVE) 220 MG tablet Take 220 mg by mouth daily as needed (pain).    . repaglinide (PRANDIN) 0.5 MG tablet Take 1 tablet by mouth 3 (three) times daily with meals.   3  . simvastatin (ZOCOR) 40 MG tablet Take 0.5 tablets  (20 mg total) by mouth at bedtime. 30 tablet 2  . tamsulosin (FLOMAX) 0.4 MG CAPS capsule Take 0.4 mg by mouth at bedtime.   3   No current facility-administered medications for this visit.          Allergies  Allergen Reactions  . Fentanyl Shortness Of Breath  . Promethazine Hcl Other (See Comments)    Cardiac arrest  . Metformin And Related Diarrhea  . Penicillins Nausea Only    Has patient had a PCN reaction causing immediate rash, facial/tongue/throat swelling, SOB or lightheadedness with hypotension: no Has patient had a PCN reaction causing severe rash involving mucus  membranes or skin necrosis: no Has patient had a PCN reaction that required hospitalization: no Has patient had a PCN reaction occurring within the last 10 years: yes If all of the above answers are "NO", then may proceed with Cephalosporin use.   . Sulfa Antibiotics Other (See Comments)    G.I. Upset  . Foeniculum Vulgare Other (See Comments)    Fennel bulbs    Review of Systems   Weight stable No edema Shortness of breath with exertion Left-sided weakness improved short-term memory still an issue No bleeding complications from Eliquis No history of GI surgery Positive history of kidney stones. Right chest wall surgery when he was a child but no intrathoracic surgery performed Left hand dominant   BP (!) 141/82 (BP Location: Left Arm, Patient Position: Sitting, Cuff Size: Large)   Pulse 70   Resp 18   Ht 5\' 11"  (1.803 m)   Wt 222 lb 9.6 oz (101 kg)   SpO2 98% Comment: RA  BMI 31.05 kg/m  Physical Exam      Exam    General- alert and comfortable no distress    Neck- no JVD, no cervical adenopathy palpable, no carotid bruit   Lungs- clear without rales, wheezes   Cor- irregular rate and rhythm, 3/6 holosystolic mitral regurg murmur ,  No gallop   Abdomen- soft, non-tender   Extremities - warm, non-tender, minimal edema   Neuro- oriented, appropriate, no focal  weakness   Diagnostic Tests: Angiograms, echocardiogram, chest x-ray all personally reviewed and discussed with patient  Impression: 72 year old diabetic recovering from a right MCA stroke which was treated with thrombus extraction November 2018.  He is currently diagnosed with ischemic diabetic cardiomyopathy three-vessel disease with EF of 35%, ischemic mitral regurgitation, and history of probable chronic atrial fibrillation.  His symptoms at this point are minimal.  However with his poor LV function surgery would be indicated for his CAD and mitral regurgitation.  He is willing to discuss surgery but not convinced he needs it.  We will proceed with a transesophageal echocardiogram evaluation of his mitral regurgitation and [facility] Robin SearingKulinsky who returned back later this month.  To review progress and discuss surgical options.  Plan: Continue current medications.  Complete above studies and then return for further discussion   Mikey BussingPeter Van Trigt III, MD [facility] [phone]      For TEE; no changes Olga MillersBrian Crenshaw MD

## 2017-09-22 NOTE — Interval H&P Note (Signed)
History and Physical Interval Note:  09/22/2017 7:23 AM  Nathan [name]  has presented today for surgery, with the diagnosis of DILATED CARDIOMYOPOATHY  The various methods of treatment have been discussed with the patient and family. After consideration of risks, benefits and other options for treatment, the patient has consented to  Procedure(s): TRANSESOPHAGEAL ECHOCARDIOGRAM (TEE) (N/A) as a surgical intervention .  The patient's history has been reviewed, patient examined, no change in status, stable for surgery.  I have reviewed the patient's chart and labs.  Questions were answered to the patient's satisfaction.     Olga MillersBrian Carola Viramontes

## 2017-09-22 NOTE — Progress Notes (Signed)
  Echocardiogram Echocardiogram Transesophageal has been performed.  Oaklyn Mans T Raniya Golembeski 09/22/2017, 8:48 AM

## 2017-09-25 ENCOUNTER — Encounter (HOSPITAL_COMMUNITY): Payer: Self-pay | Admitting: Cardiology

## 2017-09-27 ENCOUNTER — Encounter (HOSPITAL_COMMUNITY): Payer: Self-pay | Admitting: Dentistry

## 2017-09-27 ENCOUNTER — Ambulatory Visit (HOSPITAL_COMMUNITY): Payer: Self-pay | Admitting: Dentistry

## 2017-09-27 VITALS — BP 156/82 | HR 72 | Temp 97.9°F

## 2017-09-27 DIAGNOSIS — K030 Excessive attrition of teeth: Secondary | ICD-10-CM

## 2017-09-27 DIAGNOSIS — I340 Nonrheumatic mitral (valve) insufficiency: Secondary | ICD-10-CM | POA: Diagnosis not present

## 2017-09-27 DIAGNOSIS — K029 Dental caries, unspecified: Secondary | ICD-10-CM

## 2017-09-27 DIAGNOSIS — I420 Dilated cardiomyopathy: Secondary | ICD-10-CM

## 2017-09-27 DIAGNOSIS — K083 Retained dental root: Secondary | ICD-10-CM

## 2017-09-27 DIAGNOSIS — Z01818 Encounter for other preprocedural examination: Secondary | ICD-10-CM | POA: Diagnosis not present

## 2017-09-27 DIAGNOSIS — Z9189 Other specified personal risk factors, not elsewhere classified: Secondary | ICD-10-CM

## 2017-09-27 DIAGNOSIS — K0530 Chronic periodontitis, unspecified: Secondary | ICD-10-CM

## 2017-09-27 DIAGNOSIS — K036 Deposits [accretions] on teeth: Secondary | ICD-10-CM

## 2017-09-27 DIAGNOSIS — K045 Chronic apical periodontitis: Secondary | ICD-10-CM

## 2017-09-27 DIAGNOSIS — K06020 Generalized gingival recession, unspecified: Secondary | ICD-10-CM

## 2017-09-27 DIAGNOSIS — K08409 Partial loss of teeth, unspecified cause, unspecified class: Secondary | ICD-10-CM

## 2017-09-27 DIAGNOSIS — Z7901 Long term (current) use of anticoagulants: Secondary | ICD-10-CM

## 2017-09-27 DIAGNOSIS — M264 Malocclusion, unspecified: Secondary | ICD-10-CM

## 2017-09-27 NOTE — Progress Notes (Signed)
DENTAL CONSULTATION  Date of Consultation:  09/27/2017 Patient Name:   Nathan [name] Date of Birth:   Nov 30, 1945 Medical Record Number: [id number]  VITALS: BP (!) 156/82 (BP Location: Right Arm)   Pulse 72   Temp 97.9 F (36.6 C)   CHIEF COMPLAINT: Patient referred by Dr. Donata Clay for dental consultation.  HPI: Vishwa D Travontae Freiberger is a 72 year old male recently diagnosed with severe mitral regurgitation and coronary artery disease. Patient with anticipated coronary artery bypass graft procedure along with the mitral valve replacement or repair with Dr. Donata Clay. Patient is now seen as part of a pre-heart valve surgery dental protocol examination to rule out dental infection that may affect the patient's systemic health and anticipated heart valve surgery.  The patient currently denies acute toothaches, swellings, or abscesses. Patient has not seen a dentist in "a long time".  This is at least 10 years ago by patient report.  Patient denies having partial dentures. 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  PROBLEM LIST: Patient Active Problem List   Diagnosis Date Noted  . Mitral valve insufficiency   . On amiodarone therapy 07/28/2017  . CAD in native artery 07/28/2017  . Chronic anticoagulation 05/18/2017  . LBBB (left bundle branch block) 05/18/2017  . Hospital discharge follow-up 05/09/2017  . Paroxysmal atrial fibrillation (HCC) 05/01/2017  . Dilated cardiomyopathy (HCC) 05/01/2017  . DM (diabetes mellitus) type 2, uncontrolled, with ketoacidosis (HCC) 05/01/2017  . Hypotension 05/01/2017  . HLD (hyperlipidemia) 05/01/2017  . Cerebrovascular accident (CVA) due to embolism of right middle cerebral artery (HCC) 04/29/2017  . Hypertensive heart disease with heart failure (HCC) 11/26/2016  . Mixed dyslipidemia 11/26/2016  . Chronic systolic heart failure (HCC) 04/24/2015  . IVCD (intraventricular  conduction defect) 04/24/2015    PMH: Past Medical History:  Diagnosis Date  . Atrial fibrillation with RVR (HCC) 05/01/2017  . Cardiomyopathy (HCC) 05/01/2017  . CHF (congestive heart failure) (HCC)   . 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 DM (diabetes mellitus) type 2, uncontrolled, with ketoacidosis (HCC) 05/01/2017  . Essential hypertension 11/26/2016  . HLD (hyperlipidemia) 05/01/2017  . IVCD (intraventricular conduction defect) 04/24/2015  . Mixed dyslipidemia 11/26/2016  . Stroke (cerebrum) (HCC) 04/29/2017    PSH: Past Surgical History:  Procedure Laterality Date  . IR PERCUTANEOUS ART THROMBECTOMY/INFUSION INTRACRANIAL INC DIAG ANGIO  04/29/2017  . IR RADIOLOGIST EVAL & MGMT  05/17/2017  . IR US GUIDE VASC ACCESS LEFT  04/29/2017  . IR US GUIDE VASC ACCESS RIGHT  04/29/2017  . RADIOLOGY WITH ANESTHESIA N/A 04/29/2017   Procedure: IR WITH ANESTHESIA;  Surgeon: Radiologist, Medication, MD;  Location: [facility] OR;  Service: Radiology;  Laterality: N/A;  . RIGHT/LEFT HEART CATH AND CORONARY ANGIOGRAPHY N/A 08/18/2017   Procedure: RIGHT/LEFT HEART CATH AND CORONARY ANGIOGRAPHY;  Surgeon: [name], MD;  Location: [facility] INVASIVE CV LAB;  Service: Cardiovascular;  Laterality: N/A;  . TEE WITHOUT CARDIOVERSION N/A 09/22/2017   Procedure: TRANSESOPHAGEAL ECHOCARDIOGRAM (TEE);  Surgeon: [name], MD;  Location: [facility] ENDOSCOPY;  Service: Cardiovascular;  Laterality: N/A;    ALLERGIES: Allergies  Allergen Reactions  . Fentanyl Shortness Of Breath  . Promethazine Hcl Other (See Comments)    Cardiac arrest  . Metformin And Related Diarrhea  . Penicillins Nausea Only    Has patient had a PCN reaction causing immediate rash, facial/tongue/throat swelling, SOB or lightheadedness with hypotension: no  Has patient had a PCN reaction causing severe rash involving mucus membranes or skin necrosis: no Has patient had a PCN reaction that required  hospitalization: no Has patient had a PCN reaction occurring within the last 10 years: yes If all of the above answers are "NO", then may proceed with Cephalosporin use.   . Sulfa Antibiotics Other (See Comments)    G.I. Upset  . Foeniculum Vulgare Other (See Comments)    Fennel bulbs    MEDICATIONS: Current Outpatient Medications  Medication Sig Dispense Refill  . amiodarone (PACERONE) 200 MG tablet Take 1 tablet (200 mg total) by mouth daily. 90 tablet 3  . apixaban (ELIQUIS) 5 MG TABS tablet Take 1 tablet (5 mg total) by mouth 2 (two) times daily. 180 tablet 3  . carvedilol (COREG) 6.25 MG tablet Take 1 tablet (6.25 mg total) by mouth 2 (two) times daily. 180 tablet 3  . glipiZIDE (GLUCOTROL XL) 10 MG 24 hr tablet Take 10 mg by mouth daily with breakfast.   3  . lisinopril (PRINIVIL,ZESTRIL) 5 MG tablet Take 2.5 mg by mouth daily.    . repaglinide (PRANDIN) 0.5 MG tablet Take 1 tablet by mouth 3 (three) times daily with meals.   3  . simvastatin (ZOCOR) 40 MG tablet Take 0.5 tablets (20 mg total) by mouth at bedtime. 30 tablet 2  . tamsulosin (FLOMAX) 0.4 MG CAPS capsule Take 0.4 mg by mouth at bedtime.   3  . cyclobenzaprine (FLEXERIL) 10 MG tablet Take 10 mg by mouth 2 (two) times daily as needed.  0  . meloxicam (MOBIC) 15 MG tablet Take 15 mg by mouth daily as needed for pain.    . naproxen sodium (ALEVE) 220 MG tablet Take 220 mg by mouth daily as needed (pain).     No current facility-administered medications for this visit.     LABS: Lab Results  Component Value Date   WBC 6.6 08/12/2017   HGB 14.2 08/12/2017   HCT 40.3 08/12/2017   MCV 90 08/12/2017   PLT 191 08/12/2017      Component Value Date/Time   NA 135 08/12/2017 1142   K 4.8 08/12/2017 1142   CL 101 08/12/2017 1142   CO2 20 08/12/2017 1142   GLUCOSE 167 (H) 08/12/2017 1142   GLUCOSE 150 (H) 05/03/2017 0627   BUN 17 08/12/2017 1142   CREATININE 1.12 08/12/2017 1142   CALCIUM 9.7 08/12/2017 1142    GFRNONAA 67 08/12/2017 1142   GFRAA 77 08/12/2017 1142   Lab Results  Component Value Date   INR 1.1 08/12/2017   No results found for: PTT  SOCIAL HISTORY: Social History   Socioeconomic History  . Marital status: Married    Spouse name: Not on file  . Number of children: Not on file  . Years of education: Not on file  . Highest education level: Not on file  Occupational History  . Not on file  Social Needs  . Financial resource strain: Not on file  . Food insecurity:    Worry: Not on file    Inability: Not on file  . Transportation needs:    Medical: Not on file    Non-medical: Not on file  Tobacco Use  . Smoking status: Former Smoker    Last attempt to quit: 1994    Years since quitting: 25.2  . Smokeless tobacco: Never Used  Substance and Sexual Activity  . Alcohol use: Yes    Alcohol/week: 1.8 oz  Types: 1 Glasses of wine, 1 Cans of beer, 1 Standard drinks or equivalent per week    Comment: mix drink  . Drug use: No  . Sexual activity: Not on file  Lifestyle  . Physical activity:    Days per week: Not on file    Minutes per session: Not on file  . Stress: Not on file  Relationships  . Social connections:    Talks on phone: Not on file    Gets together: Not on file    Attends religious service: Not on file    Active member of club or organization: Not on file    Attends meetings of clubs or organizations: Not on file    Relationship status: Not on file  . Intimate partner violence:    Fear of current or ex partner: Not on file    Emotionally abused: Not on file    Physically abused: Not on file    Forced sexual activity: Not on file  Other Topics Concern  . Not on file  Social History Narrative  . Not on file    FAMILY HISTORY: Family History  Problem Relation Age of Onset  . Hypertension Father   . Diabetes Father     REVIEW OF SYSTEMS: Reviewed with the patient as per History of present illness. Psych: Patient does have significant  dental phobia related to prior experiences as a young child.  DENTAL HISTORY: CHIEF COMPLAINT: Patient referred by Dr. Donata Clay for dental consultation.  HPI: Adiel D Zinedine Ellner is a 72 year old male recently diagnosed with severe mitral regurgitation and coronary artery disease. Patient with anticipated coronary artery bypass graft procedure along with the mitral valve replacement or repair with Dr. Donata Clay. Patient is now seen as part of a pre-heart valve surgery dental protocol examination to rule out dental infection that may affect the patient's systemic health and anticipated heart valve surgery.  The patient currently denies acute toothaches, swellings, or abscesses. Patient has not seen a dentist in "a long time".  This is at least 10 years ago by patient report.  Patient denies having partial dentures. 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   DENTAL EXAMINATION: GENERAL:  The patient is a well-developed, well-nourished male in no acute distress. HEAD AND NECK:  There is no palpable neck lymphadenopathy. The patient denies acute TMJ symptoms. INTRAORAL EXAM:  The patient has incipient xerostomia. There is no evidence of oral abscess formation. There is atrophy of the edentulous alveolar ridges. DENTITION:  There are multiple missing teeth and multiple retained root segments and root tips.  The patient has evidence of significant incisal and occlusal attrition. PERIODONTAL:  Patient has chronic periodontitis with plaque and calculus accumulations, gingival recession, and tooth mobility. There is moderate bone loss noted. DENTAL CARIES/SUBOPTIMAL RESTORATIONS:  There are multiple dental caries and suboptimal dental restorations noted. ENDODONTIC:  The patient denies having acute pulpitis symptoms. Patient does have multiple areas of periapical pathology and radiolucency. CROWN AND BRIDGE:  There are no crown or  bridge restorations. PROSTHODONTIC:  Patient denies having partial dentures. OCCLUSION:  Patient has a poor occlusal scheme secondary to multiple missing teeth, multiple retained root segments, end to end occlusion, and lack of replacement of missing teeth with dental prostheses.  RADIOGRAPHIC INTERPRETATION: An orthopantogram was taken and supplemented with 11 periapical radiographs. There are multiple missing teeth. There are multiple retained root segments and root tips. There  multiple areas of periapical pathology and radiolucency. Multiple dental caries are noted. There is evidence of incisal and occlusal attrition. There is supra-eruption and drifting of the unopposed teeth into the edentulous areas. Radiographic calculus is noted. There is moderate to severe bone loss noted.   ASSESSMENTS: 1. Severe mitral regurgitation and coronary artery disease 2. Pre-heart valve surgery dental protocol 3. Chronic apical periodontitis 4. Dental caries 5. Multiple retained root segments and root tips 6. Chronic Periodontitis with bone loss 7. Accretions 8. Gingival recession 9. Tooth mobility 10. Multiple missing teeth 11. Supra-eruption and drifting of the unopposed teeth into the edentulous areas 12. Poor occlusal scheme and malocclusion 13. Risk for bleeding with invasive dental procedures due to Eliquis therapy 14. Risk for complications up to and including death with anticipated invasive dental procedures in the operating with general anesthesia secondary to cardiovascular compromise. 15. Severe dental phobia  PLAN/RECOMMENDATIONS: 1. I discussed the risks, benefits, and complications of various treatment options with the patient in relationship to his medical and dental conditions, anticipated heart valve surgery, and risk for endocarditis. We discussed various treatment options to include no treatment, total and subtotal extractions with alveoloplasty, pre-prosthetic surgery as indicated,  periodontal therapy, dental restorations, root canal therapy, crown and bridge therapy, implant therapy, and replacement of missing teeth as indicated. The patient currently wishes to proceed with extraction of remaining teeth with alveoloplasty in the operating room with general anesthesia. The patient will require the Eliquis therapy to be discontinued 2-3 days prior to anticipated dental procedures. The patient will require a new H&P from either his cardiologist or heart surgeon prior to scheduling the dental operating room procedure.  After the dental extractions, the patient will follow-up with the dentist of his choice for fabrication of upper and lower complete dentures after adequate healing and once medically stable from the anticipated heart valve surgery.  2. Discussion of findings with medical team and coordination of future medical and dental care as needed.  I spent in excess of  120 minutes during the conduct of this consultation and >50% of this time involved direct face-to-face encounter for counseling and/or coordination of the patient's care.    Charlynne Panderonald F. Rozella Servello, DDS

## 2017-09-27 NOTE — Patient Instructions (Signed)
[facility]    Department of Dental Medicine     DR. Carnelius Hammitt      HEART VALVES AND MOUTH CARE:  FACTS:   If you have any infection in your mouth, it can infect your heart valve.  If you heart valve is infected, you will be seriously ill.  Infections in the mouth can be SILENT and do not always cause pain.  Examples of infections in the mouth are gum disease, dental cavities, and abscesses.  Some possible signs of infection are: Bad breath, bleeding gums, or teeth that are sensitive to sweets, hot, and/or cold. There are many other signs as well.  WHAT YOU HAVE TO DO:   Brush your teeth after meals and at bedtime. Spend at least 2 minutes brushing well, especially behind your back teeth and all around your teeth that stand alone. Brush at the gumline also.  Do not go to bed without brushing your teeth and flossing.  If you gums bleed when you brush or floss, do NOT stop brushing or flossing. It usually means that your gums need more attention and better cleaning.   If your Dentist or Dr. Laken Lobato gave you a prescription mouthwash to use, make sure to use it as directed. If you run out of the medication, get a refill at the pharmacy.   If you were given any other medications or directions by your Dentist, please follow them. If you did not understand the directions or forget what you were told, please call. We will be happy to refresh her memory.  If you need antibiotics before dental procedures, make sure you take them one hour prior to every dental visit as directed.   Get a dental checkup every 4-6 months in order to keep your mouth healthy, or to find and treat any new infection. You will most likely need your teeth cleaned or gums treated at the same time.  If you are not able to come in for your scheduled appointment, call your Dentist as soon as possible to reschedule.  If you have a problem in between dental visits, call your Dentist.  

## 2017-09-30 ENCOUNTER — Telehealth (HOSPITAL_COMMUNITY): Payer: Self-pay | Admitting: Dentistry

## 2017-09-30 NOTE — Telephone Encounter (Signed)
09/30/2017  Patient:            Nathan [name] Date of Birth:  August 23, 1945 MRN:                [id number]   The patient was contacted at home to discuss the current plan of care. Patient is now aware of the follow-up appointment with Dr. Kathlee NationsPeter Van [name] for Tuesday, October 11, 2017 at 3:30 PM. Dr. Donata ClayVan [name] will discuss current plan for coronary artery bypass graft procedure along with mitral valve replacement or repair. The patient was then informed of the presurgical testing appointment for 10/13/2017 at 11 AM [facility].   Patient was then informed of the schedule for the dental surgery in the operating room with general anesthesia that is now scheduled for October 20, 2017 at 7:30 AM .  Patient was instructed to discontinue Eliquis therapy after his morning dose on October 17, 2017.  Patient was instructed to show up for surgery on 10/20/2017 at approximately 5:30 AM the morning of the surgery.  Patient was reminded to be n.p.o. after midnight.  Patient will be instructed on which medications to take with a small sip of water on the morning of the surgery after his presurgical testing appointment.  All questions were answered.  Patient stressed understanding.  Nathan [name], DDS

## 2017-10-05 ENCOUNTER — Telehealth: Payer: Self-pay | Admitting: *Deleted

## 2017-10-05 NOTE — Telephone Encounter (Signed)
Patient called stating he needed to push back his extractions with Dr. Kristin BruinsKulinski and timing of heart surgery due to his wife's job.  I explained to him how important this was but he stated this really needs to wait.  He said he still needs to see neurology next month to be cleared from them too.  I have updated Dr. Donata ClayVan Trigt and Dr. Kristin BruinsKulinski.  We will see patient back mid-June per patient request.  Told him to call if he wanted to be seen sooner.  He understands.  No further questions.

## 2017-10-11 ENCOUNTER — Encounter: Payer: Medicare Other | Admitting: Cardiothoracic Surgery

## 2017-10-13 ENCOUNTER — Other Ambulatory Visit (HOSPITAL_COMMUNITY): Payer: Self-pay

## 2017-10-19 ENCOUNTER — Encounter: Payer: Medicare Other | Admitting: Cardiothoracic Surgery

## 2017-10-20 ENCOUNTER — Ambulatory Visit: Admit: 2017-10-20 | Payer: Medicare Other | Admitting: Dentistry

## 2017-10-20 SURGERY — MULTIPLE EXTRACTION WITH ALVEOLOPLASTY
Anesthesia: General

## 2017-10-31 ENCOUNTER — Other Ambulatory Visit: Payer: Self-pay | Admitting: *Deleted

## 2017-10-31 DIAGNOSIS — E785 Hyperlipidemia, unspecified: Secondary | ICD-10-CM

## 2017-10-31 MED ORDER — SIMVASTATIN 40 MG PO TABS: 20.0000 mg | ORAL_TABLET | Freq: Every day | ORAL | 1 refills | Status: DC

## 2017-10-31 NOTE — Telephone Encounter (Signed)
Refill sent.

## 2017-11-03 ENCOUNTER — Encounter: Payer: Self-pay | Admitting: Adult Health

## 2017-11-03 ENCOUNTER — Ambulatory Visit: Payer: Medicare Other | Admitting: Adult Health

## 2017-11-03 VITALS — BP 130/62 | HR 78 | Ht 71.0 in | Wt 216.2 lb

## 2017-11-03 DIAGNOSIS — I480 Paroxysmal atrial fibrillation: Secondary | ICD-10-CM

## 2017-11-03 DIAGNOSIS — I10 Essential (primary) hypertension: Secondary | ICD-10-CM

## 2017-11-03 DIAGNOSIS — E785 Hyperlipidemia, unspecified: Secondary | ICD-10-CM

## 2017-11-03 DIAGNOSIS — Z7901 Long term (current) use of anticoagulants: Secondary | ICD-10-CM

## 2017-11-03 DIAGNOSIS — E1110 Type 2 diabetes mellitus with ketoacidosis without coma: Secondary | ICD-10-CM

## 2017-11-03 DIAGNOSIS — I63411 Cerebral infarction due to embolism of right middle cerebral artery: Secondary | ICD-10-CM | POA: Diagnosis not present

## 2017-11-03 NOTE — Progress Notes (Addendum)
STROKE NEUROLOGY FOLLOW UP NOTE  NAME: Nathan [name] DOB: 25-Dec-1945  REASON FOR VISIT: stroke follow up HISTORY FROM: Patient and chart and wife  Today we had the pleasure of seeing Nathan [name] in follow-up at our Neurology Clinic. Pt was accompanied by wife.   History Summary Nathan [name] a 72 y.o.malewith DM and cardiomyopathy EF of 30% admitted on 04/28/17 for left-sidedweakness.He was taken to [facility] ER where he was givenIV TPA.He had a CTA showing a possibledistal M2 occlusion, with very mild symptoms. En route, he markedly worsened, after he arrived he was taken for a stat CT perfusion which demonstrates a large penumbra. He received thrombectomy for occluded right proximal M2.   MRI showed acute right frontal MCA infarct with petechial hemorrhage within the insular component.  MRA head concerning for right M2 stenosis vs. Motion artifact.  EF 30-35%.  No DVT.  LDL 51 and A1c 8.4. He was found to have new diagnosed A. fib RVR, cardiology consulted put on Cardizem drip and later discontinued.  Resumed Coreg and started Eliquis.  Discharge home with Eliquis and continue Zocor.  07/06/17 visit Dr. Roda Shutters: During the interval time, the patient has been doing well.  Follow with cardiologist and adjusted dose for Coreg and discontinued lisinopril due to orthostatic hypotension.  Currently only on Coreg 6.25 mg twice daily.  Plan to have coronary artery CT in 2 weeks for cardiac evaluation.  BP today 104/75 and sitting position.  Stated recent A1c 6.9 and his glucose at home still fluctuating.  Finished PT/OT, recovered well physically.  Discuss about repeat MRA for clear vessel picture, patient declined due to claustrophobia.  11/03/17 update: Patient returns today for follow-up appointment and is accompanied by his wife.  He feels as though he has made good clinical recovery as far as left-sided weakness but does have slight left-sided facial droop.  Does have  complaints of intermittent numbness and tingling on his left arm but this does not stop him from doing normal activities. Has short-term memory complaints that seem to worsen once he had his stroke but has not worsened since that time.  He continues to take Eliquis with mild bruising but no bleeding.  Continues to take Zocor without side effects of myalgias.  Patient has appointment with cardiologist on 12/07/2017 to discuss mitral valve replacement or repair.  Blood pressures at today's visit satisfactory at 138/65.  Continues to stay active and states for the most part, maintains eating a healthy diet.  Does admit to snoring, daytime fatigue, insomnia and frequent napping throughout the day.  Has been told previously by a different provider that he should undergo sleep apnea testing but is not done at this time.  Patient agreeable to undergo sleep apnea testing and referral placed.  Denies new or worsening stroke/TIA symptoms.  REVIEW OF SYSTEMS: Full 14 system review of systems performed and notable only for those listed below and in HPI above, all others are negative: Snoring   The following represents the patient's updated allergies and side effects list: Allergies  Allergen Reactions  . Fentanyl Shortness Of Breath  . Promethazine Hcl Other (See Comments)    Cardiac arrest  . Metformin And Related Diarrhea  . Penicillins Nausea Only    Has patient had a PCN reaction causing immediate rash, facial/tongue/throat swelling, SOB or lightheadedness with hypotension: no Has patient had a PCN reaction causing severe rash involving mucus membranes or skin necrosis: no Has patient had a  PCN reaction that required hospitalization: no Has patient had a PCN reaction occurring within the last 10 years: yes If all of the above answers are "NO", then may proceed with Cephalosporin use.   . Sulfa Antibiotics Other (See Comments)    G.I. Upset  . Foeniculum Vulgare Other (See Comments)    Fennel bulbs     The neurologically relevant items on the patient's problem list were reviewed on today's visit.  Neurologic Examination  A problem focused neurological exam (12 or more points of the single system neurologic examination, vital signs counts as 1 point, cranial nerves count for 8 points) was performed.  Blood pressure 130/62, pulse 78, height  (1.803 m), weight 216 lb 3.2 oz (98.1 kg).  General - Well nourished, elderly Caucasian male, well developed, in no apparent distress.  Ophthalmologic - Sharp disc margins OU.   Cardiovascular - Regular rate and rhythm, not in afib.  Mental Status -  Level of arousal and orientation to time, place, and person were intact. Language including expression, naming, repetition, comprehension was assessed and found intact. Attention span and concentration were normal. Fund of Knowledge was assessed and was intact. Recall 1/3.  AFT 11.  Clock drawing 4/4.  No concern with serial additions.  Cranial Nerves II - XII - II - Visual field intact OU. III, IV, VI - Extraocular movements intact. V - Facial sensation intact bilaterally. VII -mild left sided facial paralysis VIII - Hearing & vestibular intact bilaterally. X - Palate elevates symmetrically. XI - Chin turning & shoulder shrug intact bilaterally. XII - Tongue protrusion intact.  Motor Strength - The patient's strength was normal in all extremities and pronator drift was absent.  Bulk was normal and fasciculations were absent.   Motor Tone - Muscle tone was assessed at the neck and appendages and was normal.  Reflexes - The patient's reflexes were 1+ in all extremities and he had no pathological reflexes.  Sensory - Light touch, temperature/pinprick, vibration and proprioception, and Romberg testing were assessed and were normal.    Coordination - The patient had normal movements in the hands and feet with no ataxia or dysmetria.  Tremor was absent.  Gait and Station - The patient's  transfers, posture, gait, station, and turns were observed as normal.    Data reviewed: I personally reviewed the images and agree with the radiology interpretations.  Ct Head Wo Contrast 04/29/2017 IMPRESSION:  1. Evolving infarction and right superior insula and frontal operculum measuring 4.6 cc within the region of perfusion anomaly on prior CT perfusion.  2. No new large territory acute infarction, hemorrhage, or mass effect.  3. Mild chronic microvascular ischemic changes and parenchymal volume loss of the brain.   Ct Head Wo Contrast 04/29/2017 IMPRESSION:  No intracranial hemorrhage or cytotoxic edema following vascular intervention.   [name] Head Wo Contrast 04/30/2017 MRI HEAD: 1. Mildly motion degraded examination.  2. Acute patchy RIGHT frontal lobe infarct, with petechial hemorrhage within insular component.  3. Mild to moderate chronic small vessel ischemic disease.  MRA HEAD:  1. Moderately motion degraded examination.  2. No emergent large vessel occlusion or flow-limiting stenosis.  3. Focal stenosis versus motion artifact RIGHT M2 origin.   Cerebral angiogram 04/29/2017 Status post cerebral angiogram and mechanical thrombectomyof proximal occlusion of the superior and dominant MCAbranch of the right hemisphere with restoration of TICI 3 flow.  Ct Head Code Stroke Wo Contrast Ct Cerebral Perfusion W Contrast 04/29/2017 IMPRESSION:  1. No acute hemorrhage  or mass lesion.  2. ASPECTS is 10.  3. Elevated mean transit time within the anterior right MCA distribution without associated abnormal cerebral blood volume or cerebral blood flow likely indicates an area of acute ischemia without infarction.   Dg Chest Port 1 View 04/29/2017 IMPRESSION:  Cardiomegaly with mild vascular congestion.   Dg Chest Port 1 View 05/01/17 IMPRESSION: Cardiomegaly. No pulmonary edema identified. No acute abnormality.  Transthoracic Echocardiogram  Impressions: -  Mildly dilated LV with mild LV hypertrophy. EF 30-35%. Inferolateral akinesis, anterolateral severe hypokinesis. Normal RV size and systolic function. At least moderate, eccentric posteriorly-directed mitral regurgitation. Wall motion abnormalities raise concern for infarct-related MR.  Bilateral Lower Extremity Dopplers -No evidence of DVT in the veins that were clearly visualized. The right common femoral vein was not imaged due to surgical bandages in place  EEG 04/29/2017 The EEG is abnormal and findings are suggestive of generalized and right fronto temporal focal cerebral dysfunction.Epileptiform activity was not seenduring this recording   05/03/17 Repeat Head CT IMPRESSION: Acute infarct in the insula and right frontal operculum unchanged from prior MRI. No hemorrhage or other acute abnormality.  Component     Latest Ref Rng & Units 04/29/2017 05/03/2017  Cholesterol     0 - 200 mg/dL 284   Triglycerides     <150 mg/dL 132   HDL Cholesterol     >40 mg/dL 30 (L)   Total CHOL/HDL Ratio     RATIO 3.4   VLDL     0 - 40 mg/dL 21   LDL (calc)     0 - 99 mg/dL 51   Hemoglobin G4W     4.8 - 5.6 % 8.4 (H)   Mean Plasma Glucose     mg/dL 102.72   TSH     5.366 - 4.500 uIU/mL  2.166  T4,Free(Direct)     0.61 - 1.12 ng/dL  4.40 (H)    Assessment: Mr Dohrman is a 72 year old male with history of right frontal MCA infarct on 04/28/18 secondary to new diagnosed atrial fibrillation. Vascular risk factors include HTN, HLD, CAD and newly diagnosed a. Fib.  Patient returns today for follow-up visit and overall is doing well.  Plan:  - continue eliqiuis and zocor for stroke prevention - follow up with cardiology for atrial fibrillation and Eliquis management -Referral placed for sleep study - Follow up with your primary care physician for stroke risk factor modification. Recommend maintain blood pressure goal <130/80, diabetes with hemoglobin A1c goal below 7.0% and  lipids with LDL cholesterol goal below 70 mg/dL.  - check BP and glucose at home. Avoid low BP  - healthy diet and regular exercise -Advised patient that he can take Tylenol as needed for pain -Advised patient that his dentist and/or cardiologist will contact us regarding clearance if needed dental and cardiac procedures.  As patient has been doing well and his stroke was over 6 months ago, patient is able to stop Eliquis 2-3 days prior to procedure for cardiac surgery. Patient can stop Eliquis 24 hrs prior to procedure if the procedure is low risk of bleeding or 48 hr prior if the procedure is considered high risk of bleeding.  Advised patient that there is a small chance of having a recurrent stroke while off Eliquis and during procedures and patient and wife verbalized understanding.  Follow up in 6 months or call earlier if needed  Greater than 50% time during this 25 minute consultation visit was spent on counseling  and coordination of care about HLD, HTN, CAD and atrial fibrillation (risk factors), discussion about risk benefit of anticoagulation and answering questions.   George Hugh, AGNP-BC  [facility] [address] [place], [place] [address]  Phone [phone] Fax [phone]

## 2017-11-03 NOTE — Progress Notes (Signed)
I reviewed above note and agree with the assessment and plan.   For Eliquis or Xarelto, company requested to stop 24 hr before procedure if the procedure is low risk of bleeding such as dental or endoscopy procedures, or stop 48 hr prior to procedure if consider high risk of bleeding such as open cavity surgeries. Most surgeons are OK to stop 2-3 days before the procedure, so 3-5 days are too long from stroke standpoint. Thanks.   Marvel Plan, MD PhD Stroke Neurology 11/03/2017 5:03 PM

## 2017-11-03 NOTE — Patient Instructions (Addendum)
Continue Eliquis (apixaban) daily  and zocor  for secondary stroke prevention  Continue to follow up with PCP regarding HLD and HTN management   Continue to follow up with cardiology regarding atrial fibrillation and eliquis management  Continue to monitor blood pressure at home - MAX of 2 times per day  You can take Tylenol for pain as needed   Please do mind exercises such as playing card games, word searches, and suduko  Referral for sleep study to assess for sleep apnea  Maintain strict control of hypertension with blood pressure goal below 130/90, diabetes with hemoglobin A1c goal below 6.5% and cholesterol with LDL cholesterol (bad cholesterol) goal below 70 mg/dL. I also advised the patient to eat a healthy diet with plenty of whole grains, cereals, fruits and vegetables, exercise regularly and maintain ideal body weight.  Followup in the future with me in 6 months or call earlier if needed       Thank you for coming to see [place] at [facility]. I hope we have been able to provide you high quality care today.  You may receive a patient satisfaction survey over the next few weeks. We would appreciate your feedback and comments so that we may continue to improve ourselves and the health of our patients.    Neck Exercises Neck exercises can be important for many reasons:  They can help you to improve and maintain flexibility in your neck. This can be especially important as you age.  They can help to make your neck stronger. This can make movement easier.  They can reduce or prevent neck pain.  They may help your upper back.  Ask your health care provider which neck exercises would be best for you. Exercises Neck Press Repeat this exercise 10 times. Do it first thing in the morning and right before bed or as told by your health care provider. 1. Lie on your back on a firm bed or on the floor with a pillow under your head. 2. Use your neck muscles to  push your head down on the pillow and straighten your spine. 3. Hold the position as well as you can. Keep your head facing up and your chin tucked. 4. Slowly count to 5 while holding this position. 5. Relax for a few seconds. Then repeat.  Isometric Strengthening Do a full set of these exercises 2 times a day or as told by your health care provider. 1. Sit in a supportive chair and place your hand on your forehead. 2. Push forward with your head and neck while pushing back with your hand. Hold for 10 seconds. 3. Relax. Then repeat the exercise 3 times. 4. Next, do thesequence again, this time putting your hand against the back of your head. Use your head and neck to push backward against the hand pressure. 5. Finally, do the same exercise on either side of your head, pushing sideways against the pressure of your hand.  Prone Head Lifts Repeat this exercise 5 times. Do this 2 times a day or as told by your health care provider. 1. Lie face-down, resting on your elbows so that your chest and upper back are raised. 2. Start with your head facing downward, near your chest. Position your chin either on or near your chest. 3. Slowly lift your head upward. Lift until you are looking straight ahead. Then continue lifting your head as far back as you can stretch. 4. Hold your head up for 5 seconds. Then slowly  lower it to your starting position.  Supine Head Lifts Repeat this exercise 8-10 times. Do this 2 times a day or as told by your health care provider. 1. Lie on your back, bending your knees to point to the ceiling and keeping your feet flat on the floor. 2. Lift your head slowly off the floor, raising your chin toward your chest. 3. Hold for 5 seconds. 4. Relax and repeat.  Scapular Retraction Repeat this exercise 5 times. Do this 2 times a day or as told by your health care provider. 1. Stand with your arms at your sides. Look straight ahead. 2. Slowly pull both shoulders backward and  downward until you feel a stretch between your shoulder blades in your upper back. 3. Hold for 10-30 seconds. 4. Relax and repeat.  Contact a health care provider if:  Your neck pain or discomfort gets much worse when you do an exercise.  Your neck pain or discomfort does not improve within 2 hours after you exercise. If you have any of these problems, stop exercising right away. Do not do the exercises again unless your health care provider says that you can. Get help right away if:  You develop sudden, severe neck pain. If this happens, stop exercising right away. Do not do the exercises again unless your health care provider says that you can. Exercises Neck Stretch  Repeat this exercise 3-5 times. 1. Do this exercise while standing or while sitting in a chair. 2. Place your feet flat on the floor, shoulder-width apart. 3. Slowly turn your head to the right. Turn it all the way to the right so you can look over your right shoulder. Do not tilt or tip your head. 4. Hold this position for 10-30 seconds. 5. Slowly turn your head to the left, to look over your left shoulder. 6. Hold this position for 10-30 seconds.  Neck Retraction Repeat this exercise 8-10 times. Do this 3-4 times a day or as told by your health care provider. 1. Do this exercise while standing or while sitting in a sturdy chair. 2. Look straight ahead. Do not bend your neck. 3. Use your fingers to push your chin backward. Do not bend your neck for this movement. Continue to face straight ahead. If you are doing the exercise properly, you will feel a slight sensation in your throat and a stretch at the back of your neck. 4. Hold the stretch for 1-2 seconds. Relax and repeat.  This information is not intended to replace advice given to you by your health care provider. Make sure you discuss any questions you have with your health care provider. Document Released: 05/26/2015 Document Revised: 11/20/2015 Document  Reviewed: 12/23/2014 Elsevier Interactive Patient Education  2018 [employer].    Mindfulness-Based Stress Reduction Mindfulness-based stress reduction (MBSR) is a program that helps people learn to practice mindfulness. Mindfulness is the practice of intentionally paying attention to the present moment. It can be learned and practiced through techniques such as education, breathing exercises, meditation, and yoga. MBSR includes several mindfulness techniques in one program. MBSR works best when you understand the treatment, are willing to try new things, and can commit to spending time practicing what you learn. MBSR training may include learning about:  How your emotions, thoughts, and reactions affect your body.  New ways to respond to things that cause negative thoughts to start (triggers).  How to notice your thoughts and let go of them.  Practicing awareness of everyday things that  you normally do without thinking.  The techniques and goals of different types of meditation.  What are the benefits of MBSR? MBSR can have many benefits, which include helping you to:  Develop self-awareness. This refers to knowing and understanding yourself.  Learn skills and attitudes that help you to participate in your own health care.  Learn new ways to care for yourself.  Be more accepting about how things are, and let things go.  Be less judgmental and approach things with an open mind.  Be patient with yourself and trust yourself more.  MBSR has also been shown to:  Reduce negative emotions, such as depression and anxiety.  Improve memory and focus.  Change how you sense and approach pain.  Boost your body's ability to fight infections.  Help you connect better with other people.  Improve your sense of well-being.  Follow these instructions at home:  Find a local in-person or online MBSR program.  Set aside some time regularly for mindfulness practice.  Find a  mindfulness practice that works best for you. This may include one or more of the following: ? Meditation. Meditation involves focusing your mind on a certain thought or activity. ? Breathing awareness exercises. These help you to stay present by focusing on your breath. ? Body scan. For this practice, you lie down and pay attention to each part of your body from head to toe. You can identify tension and soreness and intentionally relax parts of your body. ? Yoga. Yoga involves stretching and breathing, and it can improve your ability to move and be flexible. It can also provide an experience of testing your body's limits, which can help you release stress. ? Mindful eating. This way of eating involves focusing on the taste, texture, color, and smell of each bite of food. Because this slows down eating and helps you feel full sooner, it can be an important part of a weight-loss plan.  Find a podcast or recording that provides guidance for breathing awareness, body scan, or meditation exercises. You can listen to these any time when you have a free moment to rest without distractions.  Follow your treatment plan as told by your health care provider. This may include taking regular medicines and making changes to your diet or lifestyle as recommended. How to practice mindfulness To do a basic awareness exercise:  Find a comfortable place to sit.  Pay attention to the present moment. Observe your thoughts, feelings, and surroundings just as they are.  Avoid placing judgment on yourself, your feelings, or your surroundings. Make note of any judgment that comes up, and let it go.  Your mind may wander, and that is okay. Make note of when your thoughts drift, and return your attention to the present moment.  To do basic mindfulness meditation:  Find a comfortable place to sit. This may include a stable chair or a firm floor cushion. ? Sit upright with your back straight. Let your arms fall next to  your side with your hands resting on your legs. ? If sitting in a chair, rest your feet flat on the floor. ? If sitting on a cushion, cross your legs in front of you.  Keep your head in a neutral position with your chin dropped slightly. Relax your jaw and rest the tip of your tongue on the roof of your mouth. Drop your gaze to the floor. You can close your eyes if you like.  Breathe normally and pay attention to your breath.  Feel the air moving in and out of your nose. Feel your belly expanding and relaxing with each breath.  Your mind may wander, and that is okay. Make note of when your thoughts drift, and return your attention to your breath.  Avoid placing judgment on yourself, your feelings, or your surroundings. Make note of any judgment or feelings that come up, let them go, and bring your attention back to your breath.  When you are ready, lift your gaze or open your eyes. Pay attention to how your body feels after the meditation.  Where to find more information: You can find more information about MBSR from:  Your health care provider.  Community-based meditation centers or programs.  Programs offered near you.  Summary  Mindfulness-based stress reduction (MBSR) is a program that teaches you how to intentionally pay attention to the present moment. It is used with other treatments to help you cope better with daily stress, emotions, and pain.  MBSR focuses on developing self-awareness, which allows you to respond to life stress without judgment or negative emotions.  MBSR programs may involve learning different mindfulness practices, such as breathing exercises, meditation, yoga, body scan, or mindful eating. Find a mindfulness practice that works best for you, and set aside time for it on a regular basis. This information is not intended to replace advice given to you by your health care provider. Make sure you discuss any questions you have with your health care  provider. Document Released: 10/21/2016 Document Revised: 10/21/2016 Document Reviewed: 10/21/2016 Elsevier Interactive Patient Education  [employer].

## 2017-11-04 NOTE — Progress Notes (Signed)
Updated my note. Thank you

## 2017-11-05 DIAGNOSIS — F4323 Adjustment disorder with mixed anxiety and depressed mood: Secondary | ICD-10-CM | POA: Insufficient documentation

## 2017-11-05 HISTORY — DX: Adjustment disorder with mixed anxiety and depressed mood: F43.23

## 2017-11-24 ENCOUNTER — Telehealth: Payer: Self-pay

## 2017-11-24 DIAGNOSIS — Z7901 Long term (current) use of anticoagulants: Secondary | ICD-10-CM

## 2017-11-24 DIAGNOSIS — I110 Hypertensive heart disease with heart failure: Secondary | ICD-10-CM

## 2017-11-24 DIAGNOSIS — I480 Paroxysmal atrial fibrillation: Secondary | ICD-10-CM

## 2017-11-24 MED ORDER — CARVEDILOL 6.25 MG PO TABS
6.2500 mg | ORAL_TABLET | Freq: Two times a day (BID) | ORAL | 3 refills | Status: DC
Start: 2017-11-24 — End: 2018-02-03

## 2017-11-24 NOTE — Telephone Encounter (Signed)
Rx for carvedilol 6.25mg  one tablet twice daily #60 sent to [facility] as requested.

## 2017-12-07 ENCOUNTER — Ambulatory Visit: Payer: Medicare Other | Admitting: Cardiothoracic Surgery

## 2017-12-07 ENCOUNTER — Other Ambulatory Visit: Payer: Self-pay

## 2017-12-07 ENCOUNTER — Encounter: Payer: Self-pay | Admitting: Cardiothoracic Surgery

## 2017-12-07 VITALS — BP 122/72 | HR 59 | Resp 18 | Ht 71.0 in | Wt 207.4 lb

## 2017-12-07 DIAGNOSIS — I2510 Atherosclerotic heart disease of native coronary artery without angina pectoris: Secondary | ICD-10-CM

## 2017-12-07 DIAGNOSIS — I340 Nonrheumatic mitral (valve) insufficiency: Secondary | ICD-10-CM

## 2017-12-07 NOTE — Progress Notes (Signed)
PCP is Hadley Penobbins, Robert A, MD Referring Provider is End, Cristal Deerhristopher, MD  Chief Complaint  Patient presents with  . Follow-up    further discuss surgery  . 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  The patient was first seen 2 months ago after cardiac cath showed severe three-vessel coronary disease with EF of 30-35%.  There is chronic occlusion of the RCA, subtotal occlusion of the circumflex, moderate left main and proximal LAD stenosis with disease of a significant diagonal branch LAD.  Right heart cath pressures were fairly normal despite low EF.  Cardiac output was normal.  His transthoracic echo were poor images to assess the mitral valve and he underwent TEE by Dr. Jens Somrenshaw since his  original consult visit.  The patient has severe ischemic MR.  No significant aortic valve or tricuspid valve disease.  RV function fairly well preserved.  LVEF still remains 30-35%.  The patient has several necrotic teeth which need to be treated.  He was seen in consultation by Dr. [name] in the dental clinic but did not follow through with recommendations for dental extractions.  He was depressed and having second thoughts about having heart surgery.  Now he feels much better.  He is lost some weight.  He denies any significant symptoms of heart failure.  He is ready to have dental extractions done followed by cardiac surgery to include multivessel CABG, mitral valve replacement with a tissue valve, and possible combined Maze procedure.  He is taking his Eliquis without bleeding complications.   Past medical history   72 year old diabetic presents for recently diagnosed severe three-vessel CAD with ischemic cardiomyopathy EF 35%, moderate-severe ischemic mitral regurgitation and atrial fibrillation which caused an embolic right cerebral CVA treated with IR clot  extraction and reversal of left-sided weakness November 2018.  Patient currently on Eliquis in rate controlled atrial fibrillation.  Following recovery from his stroke he underwent left and right heart catheterization by Dr. and which demonstrated severe three-vessel coronary disease with a diabetic pattern with chronic occlusion the RCA and 70% left main stenosis.  PA pressures and cardiac output were normal.  Patient currently is asymptomatic of chest pain or shortness of breath and is recovering well from the left-sided weakness of the stroke but still has short-term memory issues.  Patient is unsure of how long he has had atrial fibrillation.  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 Atrial fibrillation with RVR (HCC) 05/01/2017  . Cardiomyopathy (HCC) 05/01/2017  . CHF (congestive heart failure) (HCC)   . 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 DM (diabetes mellitus) type 2, uncontrolled, with ketoacidosis (HCC) 05/01/2017  . Essential hypertension 11/26/2016  . HLD (hyperlipidemia) 05/01/2017  . IVCD (intraventricular conduction defect) 04/24/2015  . Mixed dyslipidemia 11/26/2016  . 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 IR PERCUTANEOUS ART THROMBECTOMY/INFUSION INTRACRANIAL INC DIAG ANGIO  04/29/2017  . IR RADIOLOGIST EVAL & MGMT  05/17/2017  . IR US GUIDE VASC ACCESS LEFT  04/29/2017  . IR US GUIDE VASC ACCESS RIGHT  04/29/2017  . RADIOLOGY WITH ANESTHESIA N/A 04/29/2017   Procedure: IR WITH ANESTHESIA;  Surgeon: Radiologist, Medication, MD;  Location: [facility] OR;  Service: Radiology;  Laterality: N/A;  .  RIGHT/LEFT HEART CATH AND CORONARY ANGIOGRAPHY N/A 08/18/2017   Procedure: RIGHT/LEFT HEART CATH AND CORONARY ANGIOGRAPHY;  Surgeon: [name], MD;  Location: [facility] INVASIVE CV LAB;  Service: Cardiovascular;  Laterality: N/A;  . TEE  WITHOUT CARDIOVERSION N/A 09/22/2017   Procedure: TRANSESOPHAGEAL ECHOCARDIOGRAM (TEE);  Surgeon: [name], MD;  Location: [facility] ENDOSCOPY;  Service: Cardiovascular;  Laterality: N/A;    Family History  Problem Relation Age of Onset  . Hypertension Father   . Diabetes Father     Social History Social History   Tobacco Use  . Smoking status: Former Smoker    Last attempt to quit: 1994    Years since quitting: 25.4  . Smokeless tobacco: Never Used  Substance Use Topics  . Alcohol use: Yes    Alcohol/week: 1.8 oz    Types: 1 Glasses of wine, 1 Cans of beer, 1 Standard drinks or equivalent per week    Comment: mix drink  . Drug use: No    Current Outpatient Medications  Medication Sig Dispense Refill  . acetaminophen (TYLENOL) 325 MG tablet Take 650 mg by mouth every 6 (six) hours as needed.    [name] amiodarone (PACERONE) 200 MG tablet Take 1 tablet (200 mg total) by mouth daily. 90 tablet 3  . apixaban (ELIQUIS) 5 MG TABS tablet Take 1 tablet (5 mg total) by mouth 2 (two) times daily. 180 tablet 3  . carvedilol (COREG) 6.25 MG tablet Take 1 tablet (6.25 mg total) by mouth 2 (two) times daily. 60 tablet 3  . cyclobenzaprine (FLEXERIL) 10 MG tablet Take 10 mg by mouth 2 (two) times daily as needed.  0  . glipiZIDE (GLUCOTROL XL) 10 MG 24 hr tablet Take 10 mg by mouth daily with breakfast.   3  . lisinopril (PRINIVIL,ZESTRIL) 5 MG tablet Take 2.5 mg by mouth daily.    . repaglinide (PRANDIN) 0.5 MG tablet Take 1 tablet by mouth 3 (three) times daily with meals.   3  . simvastatin (ZOCOR) 40 MG tablet Take 0.5 tablets (20 mg total) by mouth at bedtime. 45 tablet 1  . tamsulosin (FLOMAX) 0.4 MG CAPS capsule Take 0.4 mg by mouth at bedtime.   3   No current facility-administered medications for this visit.     Allergies  Allergen Reactions  . Fentanyl Shortness Of Breath  . Promethazine Hcl Other (See Comments)    Cardiac arrest  . Metformin And Related Diarrhea  . Penicillins  Nausea Only    Has patient had a PCN reaction causing immediate rash, facial/tongue/throat swelling, SOB or lightheadedness with hypotension: no Has patient had a PCN reaction causing severe rash involving mucus membranes or skin necrosis: no Has patient had a PCN reaction that required hospitalization: no Has patient had a PCN reaction occurring within the last 10 years: yes If all of the above answers are "NO", then may proceed with Cephalosporin use.   . Sulfa Antibiotics Other (See Comments)    G.I. Upset  . Foeniculum Vulgare Other (See Comments)    Fennel bulbs    Review of Systems  Never smoker He has recovered from the stroke which caused transient left-sided weakness 2018 He has difficulty with short-term memory for new tasks or assignments He is a non-smoker. He denies recent history of upper respiratory infection or bronchitis No GI bleeding on Eliquis No syncope or falls Intentional weight loss of approximately 15 to 18 pounds which has made him feel better. Previous depression earlier this year is now  resolved after he stopped taking Lexapro which he had a poor reaction  BP 122/72 (BP Location: Right Arm, Patient Position: Sitting, Cuff Size: Normal)   Pulse (!) 59   Resp 18   Ht 5\' 11"  (1.803 m)   Wt 207 lb 6.4 oz (94.1 kg)   SpO2 98% Comment: RA  BMI 28.93 kg/m  Physical Exam      Exam    General- alert and comfortable    Neck- no JVD, no cervical adenopathy palpable, no carotid bruit   Lungs- clear without rales, wheezes   Cor-irregular rhythm of atrial fibrillation, 3/6 MR murmur ,no gallop   Abdomen- soft, non-tender   Extremities - warm, non-tender, minimal edema.  Poor pulse in right hand   Neuro- oriented, appropriate, no focal weakness   Diagnostic Tests: Previous TEE, coronary angiogram and cardiac CT images all personally reviewed and counseled with patient  Impression: Patient would benefit from multivessel CABG with mitral valve replacement  and possible combined Maze procedure.  He will need:            Dental extractions for which he can stop Eliquis for several days and just take aspirin            PFTs and CT scan of chest with lung windows to assess for underlying pulmonary disease            He will need to  hold his Eliquis for at least 5 days prior to cardiac surgery.  Plan: Return in 3 weeks to schedule a date for surgery after he recovers from dental surgery.   Nathan Bussing, MD [facility] [phone]

## 2017-12-14 ENCOUNTER — Telehealth (HOSPITAL_COMMUNITY): Payer: Self-pay | Admitting: Dentistry

## 2017-12-14 NOTE — Telephone Encounter (Signed)
12/14/2017  Patient:            Alvira Philipsandleman D Ditmars Jr Date of Birth:  [birth year] MRN:                [id number]   There has been no answer on multiple calls to patient to arrange for F/U appt. with patient to discuss plan of dental care.  No voice mail on his mobile number. Message has been left on voice mail of wife, Lawerance CruelSherry Centner, to call Dental Medicine to schedule F/u appointment.  Katie Wright/Dr. Kristin BruinsKulinski

## 2017-12-15 ENCOUNTER — Ambulatory Visit (HOSPITAL_COMMUNITY): Payer: Self-pay | Admitting: Dentistry

## 2017-12-15 ENCOUNTER — Encounter (HOSPITAL_COMMUNITY): Payer: Self-pay | Admitting: Dentistry

## 2017-12-15 VITALS — BP 138/69 | HR 60 | Temp 97.8°F

## 2017-12-15 DIAGNOSIS — F40232 Fear of other medical care: Secondary | ICD-10-CM | POA: Insufficient documentation

## 2017-12-15 DIAGNOSIS — K06020 Generalized gingival recession, unspecified: Secondary | ICD-10-CM

## 2017-12-15 DIAGNOSIS — K08409 Partial loss of teeth, unspecified cause, unspecified class: Secondary | ICD-10-CM

## 2017-12-15 DIAGNOSIS — K036 Deposits [accretions] on teeth: Secondary | ICD-10-CM

## 2017-12-15 DIAGNOSIS — I420 Dilated cardiomyopathy: Secondary | ICD-10-CM

## 2017-12-15 DIAGNOSIS — K045 Chronic apical periodontitis: Secondary | ICD-10-CM | POA: Insufficient documentation

## 2017-12-15 DIAGNOSIS — K0530 Chronic periodontitis, unspecified: Secondary | ICD-10-CM | POA: Insufficient documentation

## 2017-12-15 DIAGNOSIS — I340 Nonrheumatic mitral (valve) insufficiency: Secondary | ICD-10-CM | POA: Diagnosis not present

## 2017-12-15 DIAGNOSIS — Z01818 Encounter for other preprocedural examination: Secondary | ICD-10-CM

## 2017-12-15 DIAGNOSIS — M264 Malocclusion, unspecified: Secondary | ICD-10-CM

## 2017-12-15 DIAGNOSIS — K029 Dental caries, unspecified: Secondary | ICD-10-CM | POA: Insufficient documentation

## 2017-12-15 DIAGNOSIS — Z9189 Other specified personal risk factors, not elsewhere classified: Secondary | ICD-10-CM

## 2017-12-15 DIAGNOSIS — Z7901 Long term (current) use of anticoagulants: Secondary | ICD-10-CM

## 2017-12-15 DIAGNOSIS — K030 Excessive attrition of teeth: Secondary | ICD-10-CM

## 2017-12-15 DIAGNOSIS — K083 Retained dental root: Secondary | ICD-10-CM

## 2017-12-15 NOTE — Progress Notes (Signed)
12/15/2017  Patient:            Nathan [name] Date of Birth:  Nov 04, 1945 MRN:                [id number]   BP 138/69 (BP Location: Left Arm)   Pulse 60   Temp 97.8 F (36.6 C)   Tennis D Nathan [name]  Is a 72 year old male with severe mitral regurgitation, ischemic cardiomyopathy, coronary artery disease, and chronic anticoagulation. Patient with anticipated mitral valve replacement, coronary artery bypass graft procedure and possible maze procedure with Dr. Kathlee NationsPeter Van [name]. Patient now presents for limited oral examination, orthopantogram, and discussion of dental treatment in the operating with general anesthesia prior to the anticipated heart valve surgery.  The patient currently denies acute toothaches, swellings, or abscesses. Patient has not seen a dentist since he was initially seen on 09/27/2017 in the dental medicine clinic. At that time, the patient was planning on proceeding with extraction remaining teeth with alveoloplasty in the operating room with general anesthesia.  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  Past Medical History:  Diagnosis Date  . Atrial fibrillation with RVR (HCC) 05/01/2017  . Cardiomyopathy (HCC) 05/01/2017  . CHF (congestive heart failure) (HCC)   . 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 DM (diabetes mellitus) type 2, uncontrolled, with ketoacidosis (HCC) 05/01/2017  . Essential hypertension 11/26/2016  . HLD (hyperlipidemia) 05/01/2017  . IVCD (intraventricular conduction defect) 04/24/2015  . Mixed dyslipidemia 11/26/2016  . Stroke (cerebrum) (HCC) 04/29/2017    Patient Active Problem List   Diagnosis Date Noted  . Mitral valve insufficiency   . On amiodarone therapy 07/28/2017  . CAD in native artery 07/28/2017  . Chronic  anticoagulation 05/18/2017  . LBBB (left bundle branch block) 05/18/2017  . Hospital discharge follow-up 05/09/2017  . Paroxysmal atrial fibrillation (HCC) 05/01/2017  . Dilated cardiomyopathy (HCC) 05/01/2017  . DM (diabetes mellitus) type 2, uncontrolled, with ketoacidosis (HCC) 05/01/2017  . Hypotension 05/01/2017  . HLD (hyperlipidemia) 05/01/2017  . Cerebrovascular accident (CVA) due to embolism of right middle cerebral artery (HCC) 04/29/2017  . Hypertensive heart disease with heart failure (HCC) 11/26/2016  . Mixed dyslipidemia 11/26/2016  . Chronic systolic heart failure (HCC) 04/24/2015  . IVCD (intraventricular conduction defect) 04/24/2015    Allergies  Allergen Reactions  . Fentanyl Shortness Of Breath  . Promethazine Hcl Other (See Comments)    Cardiac arrest  . Metformin And Related Diarrhea  . Penicillins Nausea Only    Has patient had a PCN reaction causing immediate rash, facial/tongue/throat swelling, SOB or lightheadedness with hypotension: no Has patient had a PCN reaction causing severe rash involving mucus membranes or skin necrosis: no Has patient had a PCN reaction that required hospitalization: no Has patient had a PCN reaction occurring within the last 10 years: yes If all of the above answers are "NO", then may proceed with Cephalosporin use.   . Sulfa Antibiotics Other (See Comments)    G.I. Upset  . Foeniculum Vulgare Other (See Comments)    Fennel bulbs   Current Outpatient Medications  Medication Sig Dispense Refill  . acetaminophen (TYLENOL) 325 MG tablet Take 650 mg by mouth every 6 (six) hours as needed.    [name]. amiodarone (PACERONE) 200 MG tablet Take 1 tablet (200  mg total) by mouth daily. 90 tablet 3  . apixaban (ELIQUIS) 5 MG TABS tablet Take 1 tablet (5 mg total) by mouth 2 (two) times daily. 180 tablet 3  . carvedilol (COREG) 6.25 MG tablet Take 1 tablet (6.25 mg total) by mouth 2 (two) times daily. 60 tablet 3  . glipiZIDE (GLUCOTROL XL) 10  MG 24 hr tablet Take 10 mg by mouth daily with breakfast.   3  . lisinopril (PRINIVIL,ZESTRIL) 5 MG tablet Take 2.5 mg by mouth daily.    . repaglinide (PRANDIN) 0.5 MG tablet Take 1 tablet by mouth 3 (three) times daily with meals.   3  . simvastatin (ZOCOR) 40 MG tablet Take 0.5 tablets (20 mg total) by mouth at bedtime. 45 tablet 1  . tamsulosin (FLOMAX) 0.4 MG CAPS capsule Take 0.4 mg by mouth at bedtime.   3  . cyclobenzaprine (FLEXERIL) 10 MG tablet Take 10 mg by mouth 2 (two) times daily as needed.  0   No current facility-administered medications for this visit.     DENTAL EXAMINATION: GENERAL:  The patient is a well-developed, well-nourished male in no acute distress. HEAD AND NECK:  There is no palpable neck lymphadenopathy. The patient denies acute TMJ symptoms. INTRAORAL EXAM:  The patient has incipient xerostomia. There is no evidence of oral abscess formation. There is atrophy of the edentulous alveolar ridges. DENTITION:  There are multiple missing teeth and multiple retained root segments and root tips.  The patient has evidence of significant incisal and occlusal attrition. PERIODONTAL:  Patient has chronic periodontitis with plaque and calculus accumulations, gingival recession, and tooth mobility. There is moderate bone loss noted. DENTAL CARIES/SUBOPTIMAL RESTORATIONS:  There are multiple dental caries and suboptimal dental restorations noted. ENDODONTIC:  The patient denies having acute pulpitis symptoms. Patient does have multiple areas of periapical pathology and radiolucency. CROWN AND BRIDGE:  There are no crown or bridge restorations. PROSTHODONTIC:  Patient denies having partial dentures. OCCLUSION:  Patient has a poor occlusal scheme secondary to multiple missing teeth, multiple retained root segments, end to end occlusion, and lack of replacement of missing teeth with dental prostheses.  RADIOGRAPHIC INTERPRETATION: An orthopantogram was retaken today. There are  multiple missing teeth. There are multiple retained root segments and root tips. There are multiple areas of periapical pathology and radiolucency. Multiple dental caries are noted. There is evidence of incisal and occlusal attrition. There is supra-eruption and drifting of the unopposed teeth into the edentulous areas. Radiographic calculus is noted. There is moderate to severe bone loss noted.   ASSESSMENTS: 1. Severe mitral regurgitation and coronary artery disease 2. Pre-heart valve surgery dental protocol 3. Chronic apical periodontitis 4. Dental caries 5. Multiple retained root segments and root tips 6. Chronic Periodontitis with bone loss 7. Accretions 8. Gingival recession 9. Tooth mobility 10. Multiple missing teeth 11. Supra-eruption and drifting of the unopposed teeth into the edentulous areas 12. Poor occlusal scheme and malocclusion 13. Risk for bleeding with invasive dental procedures due to Eliquis therapy 14. Risk for complications up to and including death with anticipated invasive dental procedures in the operating with general anesthesia secondary to cardiovascular compromise. 15. Severe dental phobia  PLAN/RECOMMENDATIONS: 1. I discussed the risks, benefits, and complications of various treatment options with the patient in relationship to his medical and dental conditions, anticipated heart valve surgery, and risk for endocarditis. We discussed various treatment options to include no treatment, total and subtotal extractions with alveoloplasty, pre-prosthetic surgery as indicated, periodontal therapy, dental  restorations, root canal therapy, crown and bridge therapy, implant therapy, and replacement of missing teeth as indicated. The patient currently wishes to proceed with extraction of remaining teeth with alveoloplasty in the operating room with general anesthesia. The patient will require the Eliquis therapy to be discontinued 2-3 days prior to anticipated dental  procedures. The patient is currently scheduled for operating procedure on 12/26/2017 at [facility]. The Eliquis therapy will be discontinued after his doses on Thursday, 12/22/2017.  The patient will then follow-up with Dr. Kathlee Nations [name] to schedule his anticipated heart surgery.  After the dental extractions, the patient will follow-up with the dentist of his choice for fabrication of upper and lower complete dentures after adequate healing and once medically stable from the anticipated heart valve surgery.  2. Discussion of findings with medical team and coordination of future medical and dental care as needed.

## 2017-12-15 NOTE — Addendum Note (Signed)
Addended by: Charlynne PanderKULINSKI, RONALD F on: 12/15/2017 02:38 PM   Modules accepted: Orders, SmartSet

## 2017-12-15 NOTE — Patient Instructions (Signed)
[place]    Department of Dental Medicine     DR. Nayellie Sanseverino      HEART VALVES AND MOUTH CARE:  FACTS:   If you have any infection in your mouth, it can infect your heart valve.  If you heart valve is infected, you will be seriously ill.  Infections in the mouth can be SILENT and do not always cause pain.  Examples of infections in the mouth are gum disease, dental cavities, and abscesses.  Some possible signs of infection are: Bad breath, bleeding gums, or teeth that are sensitive to sweets, hot, and/or cold. There are many other signs as well.  WHAT YOU HAVE TO DO:   Brush your teeth after meals and at bedtime. Spend at least 2 minutes brushing well, especially behind your back teeth and all around your teeth that stand alone. Brush at the gumline also.  Do not go to bed without brushing your teeth and flossing.  If you gums bleed when you brush or floss, do NOT stop brushing or flossing. It usually means that your gums need more attention and better cleaning.   If your Dentist or Dr. Cassandria Drew gave you a prescription mouthwash to use, make sure to use it as directed. If you run out of the medication, get a refill at the pharmacy.   If you were given any other medications or directions by your Dentist, please follow them. If you did not understand the directions or forget what you were told, please call. We will be happy to refresh her memory.  If you need antibiotics before dental procedures, make sure you take them one hour prior to every dental visit as directed.   Get a dental checkup every 4-6 months in order to keep your mouth healthy, or to find and treat any new infection. You will most likely need your teeth cleaned or gums treated at the same time.  If you are not able to come in for your scheduled appointment, call your Dentist as soon as possible to reschedule.  If you have a problem in between dental visits, call your Dentist.  

## 2017-12-21 NOTE — Pre-Procedure Instructions (Signed)
Nathan [name].  12/21/2017      [id number] - [name], [place] - [address] AT [facility] [address] [place] [address] Phone: [phone] Fax: [phone]    Your procedure is scheduled on Mon., December 26, 2017 from 8:45AM-11:15AM  Report to [facility] Admitting Entrance "A" at 6:45AM  Call this number if you have problems the morning of surgery:  [phone]   Remember:  Do not eat or drink after midnight on June 30th    Take these medicines the morning of surgery with A SIP OF WATER: Amiodarone (PACERONE) and Carvedilol (COREG). If needed Cyclobenzaprine (FLEXERIL) and Acetaminophen (TYLENOL)   Follow your doctors instructions regarding your Eliquis. Last dose will be on 12/22/17, per Dr. Kristin Bruins.    As of today, stop taking all Other Aspirin Products, Vitamins, Fish oils, and Herbal medications. Also stop all NSAIDS i.e. Advil, Ibuprofen, Motrin, Aleve, Anaprox, Naproxen, BC, Goody Powders, and all Supplements.  How to Manage Your Diabetes Before and After Surgery  Why is it important to control my blood sugar before and after surgery? . Improving blood sugar levels before and after surgery helps healing and can limit problems. . A way of improving blood sugar control is eating a healthy diet by: o  Eating less sugar and carbohydrates o  Increasing activity/exercise o  Talking with your doctor about reaching your blood sugar goals . High blood sugars (greater than 180 mg/dL) can raise your risk of infections and slow your recovery, so you will need to focus on controlling your diabetes during the weeks before surgery. . Make sure that the doctor who takes care of your diabetes knows about your planned surgery including the date and location.  How do I manage my blood sugar before surgery? . Check your blood sugar at least 4 times a day, starting 2 days before surgery, to make sure that the  level is not too high or low. o Check your blood sugar the morning of your surgery when you wake up and every 2 hours until you get to the Short Stay unit. . If your blood sugar is less than 70 mg/dL, you will need to treat for low blood sugar: o Do not take insulin. o Treat a low blood sugar (less than 70 mg/dL) with  cup of clear juice (cranberry or apple), 4 glucose tablets, OR glucose gel. Recheck blood sugar in 15 minutes after treatment (to make sure it is greater than 70 mg/dL). If your blood sugar is not greater than 70 mg/dL on recheck, call [phone] o  for further instructions. . Report your blood sugar to the short stay nurse when you get to Short Stay.  . If you are admitted to the hospital after surgery: o Your blood sugar will be checked by the staff and you will probably be given insulin after surgery (instead of oral diabetes medicines) to make sure you have good blood sugar levels. o The goal for blood sugar control after surgery is 80-180 mg/dL.  WHAT DO I DO ABOUT MY DIABETES MEDICATION?  [name] Do not take GlipiZIDE (GLUCOTROL XL) and Repaglinide (PRANDIN) the morning of surgery.  . If your CBG is greater than 220 mg/dL, notify the staff upon arrival to Short Stay.  Reviewed and Endorsed by [facility] Patient Education Committee, August 2015    Do not wear jewelry.  Do not wear lotions, powders, colognes, or  deodorant.  Do not shave 48 hours prior to surgery.  Men may shave face.  Do not bring valuables to the hospital.  [facility] is not responsible for any belongings or valuables.  Contacts, dentures or bridgework may not be worn into surgery.  Leave your suitcase in the car.  After surgery it may be brought to your room.  For patients admitted to the hospital, discharge time will be determined by your treatment team.  Patients discharged the day of surgery will not be allowed to drive home.   Special instructions:   [facility]- Preparing For  Surgery  Before surgery, you can play an important role. Because skin is not sterile, your skin needs to be as free of germs as possible. You can reduce the number of germs on your skin by washing with CHG (chlorahexidine gluconate) Soap before surgery.  CHG is an antiseptic cleaner which kills germs and bonds with the skin to continue killing germs even after washing.    Oral Hygiene is also important to reduce your risk of infection.  Remember - BRUSH YOUR TEETH THE MORNING OF SURGERY WITH YOUR REGULAR TOOTHPASTE  Please do not use if you have an allergy to CHG or antibacterial soaps. If your skin becomes reddened/irritated stop using the CHG.  Do not shave (including legs and underarms) for at least 48 hours prior to first CHG shower. It is OK to shave your face.  Please follow these instructions carefully.   1. Shower the NIGHT BEFORE SURGERY and the MORNING OF SURGERY with CHG.   2. If you chose to wash your hair, wash your hair first as usual with your normal shampoo.  3. After you shampoo, rinse your hair and body thoroughly to remove the shampoo.  4. Use CHG as you would any other liquid soap. You can apply CHG directly to the skin and wash gently with a scrungie or a clean washcloth.   5. Apply the CHG Soap to your body ONLY FROM THE NECK DOWN.  Do not use on open wounds or open sores. Avoid contact with your eyes, ears, mouth and genitals (private parts). Wash Face and genitals (private parts)  with your normal soap.  6. Wash thoroughly, paying special attention to the area where your surgery will be performed.  7. Thoroughly rinse your body with warm water from the neck down.  8. DO NOT shower/wash with your normal soap after using and rinsing off the CHG Soap.  9. Pat yourself dry with a CLEAN TOWEL.  10. Wear CLEAN PAJAMAS to bed the night before surgery, wear comfortable clothes the morning of surgery  11. Place CLEAN SHEETS on your bed the night of your first shower and  DO NOT SLEEP WITH PETS.  Day of Surgery:  Do not apply any deodorants/lotions.  Please wear clean clothes to the hospital/surgery center.   Remember to brush your teeth WITH YOUR REGULAR TOOTHPASTE.  Please read over the following fact sheets that you were given. Pain Booklet, Coughing and Deep Breathing and Surgical Site Infection Prevention

## 2017-12-22 ENCOUNTER — Encounter (HOSPITAL_COMMUNITY)
Admission: RE | Admit: 2017-12-22 | Discharge: 2017-12-22 | Disposition: A | Discharge status: Home / Self Care | Payer: Medicare Other | Source: Ambulatory Visit | Attending: Dentistry | Admitting: Dentistry

## 2017-12-22 ENCOUNTER — Other Ambulatory Visit: Payer: Self-pay

## 2017-12-22 ENCOUNTER — Encounter (HOSPITAL_COMMUNITY): Payer: Self-pay

## 2017-12-22 DIAGNOSIS — E782 Mixed hyperlipidemia: Secondary | ICD-10-CM | POA: Insufficient documentation

## 2017-12-22 DIAGNOSIS — K0530 Chronic periodontitis, unspecified: Secondary | ICD-10-CM | POA: Diagnosis not present

## 2017-12-22 DIAGNOSIS — I110 Hypertensive heart disease with heart failure: Secondary | ICD-10-CM | POA: Insufficient documentation

## 2017-12-22 DIAGNOSIS — Z01818 Encounter for other preprocedural examination: Secondary | ICD-10-CM | POA: Insufficient documentation

## 2017-12-22 DIAGNOSIS — Z01812 Encounter for preprocedural laboratory examination: Secondary | ICD-10-CM | POA: Insufficient documentation

## 2017-12-22 DIAGNOSIS — I429 Cardiomyopathy, unspecified: Secondary | ICD-10-CM | POA: Diagnosis not present

## 2017-12-22 DIAGNOSIS — I340 Nonrheumatic mitral (valve) insufficiency: Secondary | ICD-10-CM | POA: Insufficient documentation

## 2017-12-22 DIAGNOSIS — Z87442 Personal history of urinary calculi: Secondary | ICD-10-CM | POA: Insufficient documentation

## 2017-12-22 DIAGNOSIS — E119 Type 2 diabetes mellitus without complications: Secondary | ICD-10-CM | POA: Insufficient documentation

## 2017-12-22 DIAGNOSIS — Z7901 Long term (current) use of anticoagulants: Secondary | ICD-10-CM | POA: Insufficient documentation

## 2017-12-22 DIAGNOSIS — Z7984 Long term (current) use of oral hypoglycemic drugs: Secondary | ICD-10-CM | POA: Insufficient documentation

## 2017-12-22 DIAGNOSIS — I2510 Atherosclerotic heart disease of native coronary artery without angina pectoris: Secondary | ICD-10-CM | POA: Diagnosis not present

## 2017-12-22 DIAGNOSIS — I5042 Chronic combined systolic (congestive) and diastolic (congestive) heart failure: Secondary | ICD-10-CM | POA: Diagnosis not present

## 2017-12-22 DIAGNOSIS — Z8673 Personal history of transient ischemic attack (TIA), and cerebral infarction without residual deficits: Secondary | ICD-10-CM | POA: Diagnosis not present

## 2017-12-22 DIAGNOSIS — Z79899 Other long term (current) drug therapy: Secondary | ICD-10-CM | POA: Diagnosis not present

## 2017-12-22 DIAGNOSIS — I482 Chronic atrial fibrillation: Secondary | ICD-10-CM | POA: Diagnosis not present

## 2017-12-22 HISTORY — DX: Personal history of urinary calculi: Z87.442

## 2017-12-22 HISTORY — DX: Atherosclerotic heart disease of native coronary artery without angina pectoris: I25.10

## 2017-12-22 HISTORY — DX: Nonrheumatic mitral (valve) insufficiency: I34.0

## 2017-12-22 LAB — BASIC METABOLIC PANEL
ANION GAP: 10 (ref 5–15)
BUN: 16 mg/dL (ref 8–23)
CALCIUM: 9.2 mg/dL (ref 8.9–10.3)
CHLORIDE: 104 mmol/L (ref 98–111)
CO2: 24 mmol/L (ref 22–32)
Creatinine, Ser: 0.94 mg/dL (ref 0.61–1.24)
GFR calc non Af Amer: >60 mL/min (ref >60)
GLUCOSE: 173 mg/dL — AB (ref 70–99)
Potassium: 4.2 mmol/L (ref 3.5–5.1)
Sodium: 138 mmol/L (ref 135–145)

## 2017-12-22 LAB — CBC
HCT: 41.4 % (ref 39.0–52.0)
HEMOGLOBIN: 13.7 g/dL (ref 13.0–17.0)
MCH: 31.8 pg (ref 26.0–34.0)
MCHC: 33.1 g/dL (ref 30.0–36.0)
MCV: 96.1 fL (ref 78.0–100.0)
Platelets: 182 10*3/uL (ref 150–400)
RBC: 4.31 MIL/uL (ref 4.22–5.81)
RDW: 13.2 % (ref 11.5–15.5)
WBC: 6.3 10*3/uL (ref 4.0–10.5)

## 2017-12-22 LAB — GLUCOSE, CAPILLARY: Glucose-Capillary: 181 mg/dL — ABNORMAL HIGH (ref 70–99)

## 2017-12-22 NOTE — Progress Notes (Signed)
Anesthesia Consult:   Case:  [id number] Date/Time:  12/26/17 0830   Procedure:  MULTIPLE EXTRACTION WITH ALVEOLOPLASTY (N/A )   Anesthesia type:  General   Pre-op diagnosis:  mitral regurgitation and chronic periodontitis   Location:  [facility] ROOM 12 / [facility] OR   Surgeon:  [name], DDS      DISCUSSION: - Pt is a 72 year old male.  Dental procedure in anticipation of CABG and MV replacement for severe MR.   - Hx of severe multivessel CAD, ischemic MR, chronic afib, cardiomyopathy, CVA (04/2017; s/p TPA, s/p thrombectomy), HTN, DM  - Saw pt in pre-admission testing. I see no contraindications to nasotracheal intubation. On exam, irregular heart rhythm, 3/6 murmur  - Last dose eliquis 12/22/17   VS: BP 128/89   Pulse (!) 53   Temp (!) 36.4 C   Resp 18   Ht 5\' 11"  (1.803 m)   Wt 212 lb 3.2 oz (96.3 kg)   SpO2 100%   BMI 29.60 kg/m   PROVIDERS: - PCP is [name], MD (notes in care everywhere)  - Neurologist is Marvel Plan, MD who cleared pt for surgery and gave ok to hold eliquis 24-48 hours prior to surgery.  - Cardiologist is Norman Herrlich, MD   LABS: Labs reviewed: Acceptable for surgery.  - HbA1c was 6.0 on 11/04/17 (in care everywhere)   (all labs ordered are listed, but only abnormal results are displayed)  Labs Reviewed  GLUCOSE, CAPILLARY - Abnormal; Notable for the following components:      Result Value   Glucose-Capillary 181 (*)    All other components within normal limits  BASIC METABOLIC PANEL - Abnormal; Notable for the following components:   Glucose, Bld 173 (*)    All other components within normal limits  CBC    EKG 07/28/17: Wide QRS rhythm. Nonspecific IV block. T wave abnormality, consider lateral ischemia.    CV:  TEE 09/22/17:  - Left ventricle: The cavity size was mildly dilated. Systolic function was moderately to severely reduced. The estimated ejection fraction was in the range of 30% to 35%. Diffuse hypokinesis. Akinesis of the  inferolateral myocardium. Akinesis of the lateral myocardium. - Aortic valve: No evidence of vegetation. There was trivial regurgitation. - Mitral valve: Mobility of the posterior leaflet was restricted. No evidence of vegetation. There was severe regurgitation. - Left atrium: The atrium was moderately dilated. No evidence of thrombus in the atrial cavity or appendage. - Right atrium: No evidence of thrombus in the atrial cavity or appendage. - Atrial septum: No defect or patent foramen ovale was identified. - Tricuspid valve: No evidence of vegetation. - Pulmonic valve: No evidence of vegetation. - Impressions: Akinesis of the inferolateral and lateral walls with remaining myocardium hypokinetic; overall moderate to severe LV dysfunction; moderate LAE; restricted posterior MV leaflet with severe MR.   R/L cardiac cath 08/18/17:  1. Significant multivessel coronary artery disease, including 50-60% distal LMCA lesion, 50% proximal LAD stenosis, subtotal occlusion of mid LCx followed by 70% OM3 stenosis, and chronic total occlusion of mid RCA. 2. Normal left heart, right heart, and pulmonary artery pressures. 3. Normal Fick cardiac output/index. 4. Small right radial artery with high takeoff precluding left heart catheterization and guide catheter advancement to allow for FFR of LMCA/LAD disease. 5. Small right radial artery with high takeoff. Consider alternative access for future catheterizations   Past Medical History:  Diagnosis Date  . Atrial fibrillation with RVR (HCC) 05/01/2017  .  Cardiomyopathy (HCC) 05/01/2017  . CHF (congestive heart failure) (HCC)   . Chronic apical periodontitis   . 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 Coronary artery disease   . Dental caries   . DM (diabetes mellitus) type 2, uncontrolled, with ketoacidosis (HCC) 05/01/2017  . Essential hypertension 11/26/2016  . History of kidney stones   . HLD (hyperlipidemia) 05/01/2017  .  IVCD (intraventricular conduction defect) 04/24/2015  . Loose, teeth   . Mitral valve regurgitation   . Mixed dyslipidemia 11/26/2016  . Periodontitis    Chronic  . Phobia of dental procedure   . 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 BRAIN SURGERY    . CARDIAC CATHETERIZATION    . IR PERCUTANEOUS ART THROMBECTOMY/INFUSION INTRACRANIAL INC DIAG ANGIO  04/29/2017  . IR RADIOLOGIST EVAL & MGMT  05/17/2017  . IR US GUIDE VASC ACCESS LEFT  04/29/2017  . IR US GUIDE VASC ACCESS RIGHT  04/29/2017  . RADIOLOGY WITH ANESTHESIA N/A 04/29/2017   Procedure: IR WITH ANESTHESIA;  Surgeon: Radiologist, Medication, MD;  Location: [facility] OR;  Service: Radiology;  Laterality: N/A;  . RIGHT/LEFT HEART CATH AND CORONARY ANGIOGRAPHY N/A 08/18/2017   Procedure: RIGHT/LEFT HEART CATH AND CORONARY ANGIOGRAPHY;  Surgeon: Yvonne KendallEnd, Christopher, MD;  Location: [facility] INVASIVE CV LAB;  Service: Cardiovascular;  Laterality: N/A;  . TEE WITHOUT CARDIOVERSION N/A 09/22/2017   Procedure: TRANSESOPHAGEAL ECHOCARDIOGRAM (TEE);  Surgeon: Lewayne Buntingrenshaw, Brian S, MD;  [facility];  Service: Cardiovascular;  Laterality: N/A;  . TONSILLECTOMY      MEDICATIONS: . acetaminophen (TYLENOL) 325 MG tablet  . amiodarone (PACERONE) 200 MG tablet  . apixaban (ELIQUIS) 5 MG TABS tablet  . carvedilol (COREG) 6.25 MG tablet  . cyclobenzaprine (FLEXERIL) 10 MG tablet  . glipiZIDE (GLUCOTROL XL) 10 MG 24 hr tablet  . lisinopril (PRINIVIL,ZESTRIL) 5 MG tablet  . repaglinide (PRANDIN) 0.5 MG tablet  . simvastatin (ZOCOR) 40 MG tablet  . tamsulosin (FLOMAX) 0.4 MG CAPS capsule   No current facility-administered medications for this encounter.    - Last dose eliquis 12/22/17   If no changes, I anticipate pt can proceed with surgery as scheduled.   Rica Mastngela Reshad Saab, FNP-BC [facility]Anesthesiology Phone: [phone] 12/22/2017 2:34 PM

## 2017-12-22 NOTE — Progress Notes (Signed)
PCP - Dr. Keturah Barreobert Robbins  Cardiologist - Dr. Dulce SellarMunley/ Dr. Cristal Deerhristopher End  Chest x-ray - 05/01/17 (E)  EKG - 07/28/17 (E)  Stress Test - Denies  ECHO - 09/22/17 (E)  Cardiac Cath - 08/18/17 (E)  Sleep Study - Denies CPAP - None  LABS- 12/22/17: CBC, BMP  ASA- Denies Eliquis: LD- 6/27  HA1C- 11/04/17: 6.0 (CE) Fasting Blood Sugar - Today 181 Checks Blood Sugar ___0__ times a day- Advised to follow diabetic pre-surgery instructions  Anesthesia- Yes-Cardiac history/surgical order  Pt denies having chest pain, sob, or fever at this time. All instructions explained to the pt, with a verbal understanding of the material. Pt agrees to go over the instructions while at home for a better understanding. The opportunity to ask questions was provided.

## 2017-12-26 ENCOUNTER — Ambulatory Visit (HOSPITAL_COMMUNITY)
Admission: RE | Admit: 2017-12-26 | Discharge: 2017-12-26 | Disposition: A | Discharge status: Home / Self Care | Payer: Medicare Other | Source: Ambulatory Visit | Attending: Dentistry | Admitting: Dentistry

## 2017-12-26 ENCOUNTER — Encounter (HOSPITAL_COMMUNITY)
Admission: RE | Disposition: A | Discharge status: Home / Self Care | Payer: Self-pay | Source: Ambulatory Visit | Attending: Dentistry

## 2017-12-26 ENCOUNTER — Ambulatory Visit (HOSPITAL_COMMUNITY): Payer: Medicare Other | Admitting: Anesthesiology

## 2017-12-26 ENCOUNTER — Encounter (HOSPITAL_COMMUNITY): Payer: Self-pay | Admitting: *Deleted

## 2017-12-26 ENCOUNTER — Ambulatory Visit (HOSPITAL_COMMUNITY): Payer: Medicare Other | Admitting: Emergency Medicine

## 2017-12-26 DIAGNOSIS — K0889 Other specified disorders of teeth and supporting structures: Secondary | ICD-10-CM | POA: Diagnosis not present

## 2017-12-26 DIAGNOSIS — E782 Mixed hyperlipidemia: Secondary | ICD-10-CM | POA: Diagnosis not present

## 2017-12-26 DIAGNOSIS — Z79899 Other long term (current) drug therapy: Secondary | ICD-10-CM | POA: Insufficient documentation

## 2017-12-26 DIAGNOSIS — E1151 Type 2 diabetes mellitus with diabetic peripheral angiopathy without gangrene: Secondary | ICD-10-CM | POA: Diagnosis not present

## 2017-12-26 DIAGNOSIS — I255 Ischemic cardiomyopathy: Secondary | ICD-10-CM | POA: Diagnosis not present

## 2017-12-26 DIAGNOSIS — I110 Hypertensive heart disease with heart failure: Secondary | ICD-10-CM | POA: Diagnosis not present

## 2017-12-26 DIAGNOSIS — I2510 Atherosclerotic heart disease of native coronary artery without angina pectoris: Secondary | ICD-10-CM | POA: Diagnosis not present

## 2017-12-26 DIAGNOSIS — Z7984 Long term (current) use of oral hypoglycemic drugs: Secondary | ICD-10-CM | POA: Insufficient documentation

## 2017-12-26 DIAGNOSIS — K083 Retained dental root: Secondary | ICD-10-CM | POA: Insufficient documentation

## 2017-12-26 DIAGNOSIS — I5042 Chronic combined systolic (congestive) and diastolic (congestive) heart failure: Secondary | ICD-10-CM | POA: Insufficient documentation

## 2017-12-26 DIAGNOSIS — K029 Dental caries, unspecified: Secondary | ICD-10-CM | POA: Insufficient documentation

## 2017-12-26 DIAGNOSIS — Z87891 Personal history of nicotine dependence: Secondary | ICD-10-CM | POA: Diagnosis not present

## 2017-12-26 DIAGNOSIS — I69311 Memory deficit following cerebral infarction: Secondary | ICD-10-CM | POA: Diagnosis not present

## 2017-12-26 DIAGNOSIS — I482 Chronic atrial fibrillation: Secondary | ICD-10-CM | POA: Diagnosis not present

## 2017-12-26 DIAGNOSIS — F40232 Fear of other medical care: Secondary | ICD-10-CM | POA: Diagnosis not present

## 2017-12-26 DIAGNOSIS — E1142 Type 2 diabetes mellitus with diabetic polyneuropathy: Secondary | ICD-10-CM | POA: Insufficient documentation

## 2017-12-26 DIAGNOSIS — K045 Chronic apical periodontitis: Secondary | ICD-10-CM | POA: Diagnosis present

## 2017-12-26 DIAGNOSIS — Z01818 Encounter for other preprocedural examination: Secondary | ICD-10-CM

## 2017-12-26 DIAGNOSIS — K0530 Chronic periodontitis, unspecified: Secondary | ICD-10-CM

## 2017-12-26 DIAGNOSIS — Z7901 Long term (current) use of anticoagulants: Secondary | ICD-10-CM | POA: Insufficient documentation

## 2017-12-26 DIAGNOSIS — I340 Nonrheumatic mitral (valve) insufficiency: Secondary | ICD-10-CM

## 2017-12-26 HISTORY — PX: MULTIPLE EXTRACTIONS WITH ALVEOLOPLASTY: SHX5342

## 2017-12-26 LAB — GLUCOSE, CAPILLARY
GLUCOSE-CAPILLARY: 113 mg/dL — AB (ref 70–99)
Glucose-Capillary: 154 mg/dL — ABNORMAL HIGH (ref 70–99)

## 2017-12-26 LAB — PROTIME-INR
INR: 1.08
Prothrombin Time: 13.9 seconds (ref 11.4–15.2)

## 2017-12-26 SURGERY — MULTIPLE EXTRACTION WITH ALVEOLOPLASTY
Anesthesia: General | Site: Mouth

## 2017-12-26 MED ORDER — LIDOCAINE-EPINEPHRINE 2 %-1:100000 IJ SOLN
INTRAMUSCULAR | Status: AC
  Filled 2017-12-26: qty 10.2

## 2017-12-26 MED ORDER — ROCURONIUM BROMIDE 10 MG/ML (PF) SYRINGE
PREFILLED_SYRINGE | INTRAVENOUS | Status: DC | PRN
  Administered 2017-12-26: 60 mg via INTRAVENOUS

## 2017-12-26 MED ORDER — SUCCINYLCHOLINE CHLORIDE 200 MG/10ML IV SOSY
PREFILLED_SYRINGE | INTRAVENOUS | Status: AC
  Filled 2017-12-26: qty 10

## 2017-12-26 MED ORDER — AMINOCAPROIC ACID SOLUTION 5% (50 MG/ML)
ORAL | Status: DC | PRN
  Administered 2017-12-26: 10 mL via ORAL

## 2017-12-26 MED ORDER — MIDAZOLAM HCL 2 MG/2ML IJ SOLN
INTRAMUSCULAR | Status: AC
  Filled 2017-12-26: qty 2

## 2017-12-26 MED ORDER — FENTANYL CITRATE (PF) 100 MCG/2ML IJ SOLN
INTRAMUSCULAR | Status: AC
  Administered 2017-12-26: 25 ug via INTRAVENOUS
  Filled 2017-12-26: qty 2

## 2017-12-26 MED ORDER — BUPIVACAINE-EPINEPHRINE 0.5% -1:200000 IJ SOLN
INTRAMUSCULAR | Status: DC | PRN
  Administered 2017-12-26: 3.2 mL

## 2017-12-26 MED ORDER — FENTANYL CITRATE (PF) 250 MCG/5ML IJ SOLN
INTRAMUSCULAR | Status: AC
  Filled 2017-12-26: qty 5

## 2017-12-26 MED ORDER — HYDROCODONE-ACETAMINOPHEN 5-325 MG PO TABS: 1.0000 | ORAL_TABLET | Freq: Four times a day (QID) | ORAL | 0 refills | Status: DC | PRN

## 2017-12-26 MED ORDER — PROPOFOL 10 MG/ML IV BOLUS
INTRAVENOUS | Status: DC | PRN
  Administered 2017-12-26: 100 mg via INTRAVENOUS

## 2017-12-26 MED ORDER — DEXAMETHASONE SODIUM PHOSPHATE 10 MG/ML IJ SOLN
INTRAMUSCULAR | Status: DC | PRN
  Administered 2017-12-26: 4 mg via INTRAVENOUS

## 2017-12-26 MED ORDER — MIDAZOLAM HCL 5 MG/5ML IJ SOLN
INTRAMUSCULAR | Status: DC | PRN
  Administered 2017-12-26: 2 mg via INTRAVENOUS

## 2017-12-26 MED ORDER — AMINOCAPROIC ACID SOLUTION 5% (50 MG/ML)
10.0000 mL | ORAL | Status: DC
  Filled 2017-12-26: qty 100

## 2017-12-26 MED ORDER — GLYCOPYRROLATE PF 0.2 MG/ML IJ SOSY
PREFILLED_SYRINGE | INTRAMUSCULAR | Status: DC | PRN
  Administered 2017-12-26 (×2): .2 mg via INTRAVENOUS

## 2017-12-26 MED ORDER — LACTATED RINGERS IV SOLN
Freq: Once | INTRAVENOUS | Status: AC
  Administered 2017-12-26: 08:00:00 via INTRAVENOUS

## 2017-12-26 MED ORDER — OXYMETAZOLINE HCL 0.05 % NA SOLN
NASAL | Status: DC | PRN
  Administered 2017-12-26: 1

## 2017-12-26 MED ORDER — CLINDAMYCIN PHOSPHATE 600 MG/50ML IV SOLN
600.0000 mg | Freq: Once | INTRAVENOUS | Status: AC
  Administered 2017-12-26: 600 mg via INTRAVENOUS
  Filled 2017-12-26: qty 50

## 2017-12-26 MED ORDER — OXYMETAZOLINE HCL 0.05 % NA SOLN
NASAL | Status: AC
  Filled 2017-12-26: qty 15

## 2017-12-26 MED ORDER — LIDOCAINE-EPINEPHRINE 2 %-1:100000 IJ SOLN
INTRAMUSCULAR | Status: DC | PRN
  Administered 2017-12-26: 6.8 mL via INTRADERMAL

## 2017-12-26 MED ORDER — EPHEDRINE SULFATE 50 MG/ML IJ SOLN
INTRAMUSCULAR | Status: DC | PRN
  Administered 2017-12-26 (×5): 10 mg via INTRAVENOUS

## 2017-12-26 MED ORDER — PROPOFOL 10 MG/ML IV BOLUS
INTRAVENOUS | Status: AC
  Filled 2017-12-26: qty 20

## 2017-12-26 MED ORDER — FENTANYL CITRATE (PF) 100 MCG/2ML IJ SOLN
25.0000 ug | INTRAMUSCULAR | Status: DC | PRN
  Administered 2017-12-26 (×3): 25 ug via INTRAVENOUS

## 2017-12-26 MED ORDER — BUPIVACAINE-EPINEPHRINE (PF) 0.5% -1:200000 IJ SOLN
INTRAMUSCULAR | Status: AC
  Filled 2017-12-26: qty 10.8

## 2017-12-26 MED ORDER — GLYCOPYRROLATE PF 0.2 MG/ML IJ SOSY
PREFILLED_SYRINGE | INTRAMUSCULAR | Status: AC
  Filled 2017-12-26: qty 3

## 2017-12-26 MED ORDER — DEXAMETHASONE SODIUM PHOSPHATE 10 MG/ML IJ SOLN
INTRAMUSCULAR | Status: AC
  Filled 2017-12-26: qty 1

## 2017-12-26 MED ORDER — SUGAMMADEX SODIUM 200 MG/2ML IV SOLN
INTRAVENOUS | Status: DC | PRN
  Administered 2017-12-26: 200 mg via INTRAVENOUS

## 2017-12-26 MED ORDER — 0.9 % SODIUM CHLORIDE (POUR BTL) OPTIME
TOPICAL | Status: DC | PRN
  Administered 2017-12-26: 1000 mL

## 2017-12-26 MED ORDER — ONDANSETRON HCL 4 MG/2ML IJ SOLN
INTRAMUSCULAR | Status: DC | PRN
  Administered 2017-12-26: 4 mg via INTRAVENOUS

## 2017-12-26 MED ORDER — HEMOSTATIC AGENTS (NO CHARGE) OPTIME
TOPICAL | Status: DC | PRN
  Administered 2017-12-26: 1 via TOPICAL

## 2017-12-26 MED ORDER — LIDOCAINE HCL (CARDIAC) PF 100 MG/5ML IV SOSY
PREFILLED_SYRINGE | INTRAVENOUS | Status: DC | PRN
  Administered 2017-12-26: 60 mg via INTRAVENOUS

## 2017-12-26 MED ORDER — ONDANSETRON HCL 4 MG/2ML IJ SOLN
INTRAMUSCULAR | Status: AC
  Filled 2017-12-26: qty 2

## 2017-12-26 MED ORDER — LACTATED RINGERS IV SOLN
INTRAVENOUS | Status: DC | PRN
  Administered 2017-12-26 (×2): via INTRAVENOUS

## 2017-12-26 SURGICAL SUPPLY — 42 items
ALCOHOL 70% 16 OZ (MISCELLANEOUS) ×3 IMPLANT
ATTRACTOMAT 16X20 MAGNETIC DRP (DRAPES) ×3 IMPLANT
BANDAGE HEMOSTAT MRDH 4X4 STRL (MISCELLANEOUS) IMPLANT
BLADE SURG 15 STRL LF DISP TIS (BLADE) ×2 IMPLANT
BLADE SURG 15 STRL SS (BLADE) ×4
BNDG HEMOSTAT MRDH 4X4 STRL (MISCELLANEOUS)
COVER SURGICAL LIGHT HANDLE (MISCELLANEOUS) ×3 IMPLANT
GAUZE PACKING FOLDED 2  STR (GAUZE/BANDAGES/DRESSINGS) ×2
GAUZE PACKING FOLDED 2 STR (GAUZE/BANDAGES/DRESSINGS) ×1 IMPLANT
GAUZE SPONGE 4X4 12PLY STRL (GAUZE/BANDAGES/DRESSINGS) ×3 IMPLANT
GAUZE SPONGE 4X4 16PLY XRAY LF (GAUZE/BANDAGES/DRESSINGS) ×3 IMPLANT
GLOVE BIO SURGEON STRL SZ 6.5 (GLOVE) ×2 IMPLANT
GLOVE BIO SURGEONS STRL SZ 6.5 (GLOVE) ×1
GLOVE SURG ORTHO 8.0 STRL STRW (GLOVE) ×3 IMPLANT
GOWN STRL REUS W/ TWL LRG LVL3 (GOWN DISPOSABLE) ×1 IMPLANT
GOWN STRL REUS W/TWL 2XL LVL3 (GOWN DISPOSABLE) ×3 IMPLANT
GOWN STRL REUS W/TWL LRG LVL3 (GOWN DISPOSABLE) ×2
HEMOSTAT SURGICEL 2X14 (HEMOSTASIS) IMPLANT
KIT BASIN OR (CUSTOM PROCEDURE TRAY) ×3 IMPLANT
KIT TURNOVER KIT B (KITS) ×3 IMPLANT
MANIFOLD NEPTUNE WASTE (CANNULA) ×3 IMPLANT
NEEDLE BLUNT 16X1.5 OR ONLY (NEEDLE) ×3 IMPLANT
NEEDLE DENTAL 27 LONG (NEEDLE) ×6 IMPLANT
NS IRRIG 1000ML POUR BTL (IV SOLUTION) ×3 IMPLANT
PACK EENT II TURBAN DRAPE (CUSTOM PROCEDURE TRAY) ×3 IMPLANT
PAD ARMBOARD 7.5X6 YLW CONV (MISCELLANEOUS) ×3 IMPLANT
SPONGE SURGIFOAM ABS GEL 100 (HEMOSTASIS) IMPLANT
SPONGE SURGIFOAM ABS GEL 12-7 (HEMOSTASIS) IMPLANT
SPONGE SURGIFOAM ABS GEL SZ50 (HEMOSTASIS) ×3 IMPLANT
SUCTION FRAZIER HANDLE 10FR (MISCELLANEOUS) ×2
SUCTION TUBE FRAZIER 10FR DISP (MISCELLANEOUS) ×1 IMPLANT
SUT CHROMIC 3 0 PS 2 (SUTURE) ×18 IMPLANT
SUT CHROMIC 4 0 P 3 18 (SUTURE) IMPLANT
SYR 50ML SLIP (SYRINGE) ×3 IMPLANT
TOWEL GREEN STERILE (TOWEL DISPOSABLE) ×3 IMPLANT
TOWEL NATURAL 10PK STERILE (DISPOSABLE) ×3 IMPLANT
TRAY ENT MC OR (CUSTOM PROCEDURE TRAY) ×3 IMPLANT
TUBE CONNECTING 12'X1/4 (SUCTIONS) ×1
TUBE CONNECTING 12X1/4 (SUCTIONS) ×2 IMPLANT
WATER STERILE IRR 1000ML POUR (IV SOLUTION) ×3 IMPLANT
WATER TABLETS ICX (MISCELLANEOUS) ×3 IMPLANT
YANKAUER SUCT BULB TIP NO VENT (SUCTIONS) ×3 IMPLANT

## 2017-12-26 NOTE — OR Nursing (Signed)
Dr. Okey Dupreose contacted on pain medication.  Pt has fentanyl ordered and states an allergy to same.  Dr. Okey Dupreose states OK to give fentanyl.

## 2017-12-26 NOTE — Progress Notes (Signed)
PRE-OPERATIVE NOTE:  12/26/2017 Nathan D [name] [id number]  VITALS: BP (!) 153/85   Pulse 63   Temp (!) 97.3 F (36.3 C) (Oral)   Resp 18   Ht 5\' 11"  (1.803 m)   Wt 212 lb (96.2 kg)   SpO2 99%   BMI 29.57 kg/m   Lab Results  Component Value Date   WBC 6.3 12/22/2017   HGB 13.7 12/22/2017   HCT 41.4 12/22/2017   MCV 96.1 12/22/2017   PLT 182 12/22/2017   BMET    Component Value Date/Time   NA 138 12/22/2017 0943   NA 135 08/12/2017 1142   K 4.2 12/22/2017 0943   CL 104 12/22/2017 0943   CO2 24 12/22/2017 0943   GLUCOSE 173 (H) 12/22/2017 0943   BUN 16 12/22/2017 0943   BUN 17 08/12/2017 1142   CREATININE 0.94 12/22/2017 0943   CALCIUM 9.2 12/22/2017 0943   GFRNONAA >60 12/22/2017 0943   GFRAA >60 12/22/2017 0943    Lab Results  Component Value Date   INR 1.08 12/26/2017   INR 1.1 08/12/2017   No results found for: PTT   Nathan D [name] presents for multiple dental extractions with alveoloplasty and pre-prosthetic surgery as needed in the operating room with general anesthesia.   SUBJECTIVE: The patient denies any acute medical or dental changes and agrees to proceed with treatment as planned.  EXAM: No sign of acute dental changes.  ASSESSMENT: Patient is affected by chronic apical periodontitis, retained root segments, dental caries, chronic periodontitis, and dental phobia.  PLAN: Patient agrees to proceed with treatment as planned in the operating room as previously discussed and accepts the risks, benefits, and complications of the proposed treatment. Patient is aware of the risk for bleeding, bruising, swelling, infection, pain, nerve damage, soft tissue damage, sinus involvement, root tip fracture, mandible fracture, and the risks of complications associated with the anesthesia. Patient also is aware of the potential for other complications up to and including death due to his overall cardiovascular compromise.     Charlynne Panderonald F. Temari Schooler,  DDS

## 2017-12-26 NOTE — H&P (Signed)
12/26/2017  Patient:            Nathan [name] Nathan Nathan RamusFerree Jr. Date of Birth:  January 12, 1946 MRN:                [id number]   BP (!) 153/85   Pulse 63   Temp (!) 97.3 F (36.3 C) (Oral)   Resp 18   Ht 5\' 11"  (1.803 m)   Wt 212 lb (96.2 kg)   SpO2 99%   BMI 29.57 kg/m    Stephone Nathan Nathan RamusFerree Jr. is a 72 yo male that presents for extraction of remaining teeth with alveoloplasty and pre-prosthetic surgery as needed in the operating room with general anesthesia.  The patient denies any acute medical or dental changes. Please see note from Dr. Kathlee NationsPeter Van [name] dated 12/07/2017 to act as H&P for dental operating room procedure.  [name], DDS     Nathan [name], Peter, MD  Physician  Cardiothoracic Surgery  Progress Notes    Signed  Encounter Date:  12/07/2017          Signed            Show:Clear all [x] Manual[x] Template[x] Copied  Added by: [x] Nathan [name], Peter, MD   [] Hover for details   PCP is Hadley Penobbins, Robert A, MD Referring Provider is End, Cristal Deerhristopher, MD      Chief Complaint  Patient presents with  . Follow-up    further discuss surgery  . 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  The patient was first seen 2 months ago after cardiac cath showed severe three-vessel coronary disease with EF of 30-35%.  There is chronic occlusion of the RCA, subtotal occlusion of the circumflex, moderate left main and proximal LAD stenosis with disease of a significant diagonal branch LAD.  Right heart cath pressures were fairly normal despite low EF.  Cardiac output was normal.  His transthoracic echo were poor images to assess the mitral valve and he underwent TEE by Dr. Jens Somrenshaw since his  original consult visit.  The patient has severe ischemic MR.  No significant aortic valve or tricuspid valve disease.  RV function fairly  well preserved.  LVEF still remains 30-35%.  The patient has several necrotic teeth which need to be treated.  He was seen in consultation by Dr. [name] in the dental clinic but did not follow through with recommendations for dental extractions.  He was depressed and having second thoughts about having heart surgery.  Now he feels much better.  He is lost some weight.  He denies any significant symptoms of heart failure.  He is ready to have dental extractions done followed by cardiac surgery to include multivessel CABG, mitral valve replacement with a tissue valve, and possible combined Maze procedure.  He is taking his Eliquis without bleeding complications.   Past medical history  72 year old diabetic presents for recently diagnosed severe three-vessel CAD with ischemic cardiomyopathy EF 35%, moderate-severe ischemic mitral regurgitation and atrial fibrillation which caused an embolic right cerebral CVA treated with IR clot extraction and reversal of left-sided weakness November 2018. Patient currently on Eliquis in rate controlled atrial fibrillation. Following recovery from his stroke he underwent left and right heart catheterization by Dr. and which demonstrated severe three-vessel coronary disease with a diabetic pattern with chronic occlusion the RCA and 70% left main stenosis. PA pressures and cardiac output were normal. Patient currently  is asymptomatic of chest pain or shortness of breath and is recovering well from the left-sided weakness of the stroke but still has short-term memory issues. Patient is unsure of how long he has had atrial fibrillation. 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 Atrial fibrillation with RVR (HCC) 05/01/2017  . Cardiomyopathy (HCC) 05/01/2017  . CHF (congestive heart failure) (HCC)   . 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 DM (diabetes  mellitus) type 2, uncontrolled, with ketoacidosis (HCC) 05/01/2017  . Essential hypertension 11/26/2016  . HLD (hyperlipidemia) 05/01/2017  . IVCD (intraventricular conduction defect) 04/24/2015  . Mixed dyslipidemia 11/26/2016  . Stroke (cerebrum) (HCC) 04/29/2017         Past Surgical History:  Procedure Laterality Date  . IR PERCUTANEOUS ART THROMBECTOMY/INFUSION INTRACRANIAL INC DIAG ANGIO  04/29/2017  . IR RADIOLOGIST EVAL & MGMT  05/17/2017  . IR US GUIDE VASC ACCESS LEFT  04/29/2017  . IR US GUIDE VASC ACCESS RIGHT  04/29/2017  . RADIOLOGY WITH ANESTHESIA N/A 04/29/2017   Procedure: IR WITH ANESTHESIA;  Surgeon: Radiologist, Medication, MD;  Location: [facility] OR;  Service: Radiology;  Laterality: N/A;  . RIGHT/LEFT HEART CATH AND CORONARY ANGIOGRAPHY N/A 08/18/2017   Procedure: RIGHT/LEFT HEART CATH AND CORONARY ANGIOGRAPHY;  Surgeon: [name], MD;  Location: [facility] INVASIVE CV LAB;  Service: Cardiovascular;  Laterality: N/A;  . TEE WITHOUT CARDIOVERSION N/A 09/22/2017   Procedure: TRANSESOPHAGEAL ECHOCARDIOGRAM (TEE);  Surgeon: [name], MD;  Location: [facility] ENDOSCOPY;  Service: Cardiovascular;  Laterality: N/A;         Family History  Problem Relation Age of Onset  . Hypertension Father   . Diabetes Father     Social History Social History        Tobacco Use  . Smoking status: Former Smoker    Last attempt to quit: 1994    Years since quitting: 25.4  . Smokeless tobacco: Never Used  Substance Use Topics  . Alcohol use: Yes    Alcohol/week: 1.8 oz    Types: 1 Glasses of wine, 1 Cans of beer, 1 Standard drinks or equivalent per week    Comment: mix drink  . Drug use: No          Current Outpatient Medications  Medication Sig Dispense Refill  . acetaminophen (TYLENOL) 325 MG tablet Take 650 mg by mouth every 6 (six) hours as needed.    [name] amiodarone (PACERONE) 200 MG tablet Take 1 tablet (200 mg total) by mouth daily. 90 tablet 3  .  apixaban (ELIQUIS) 5 MG TABS tablet Take 1 tablet (5 mg total) by mouth 2 (two) times daily. 180 tablet 3  . carvedilol (COREG) 6.25 MG tablet Take 1 tablet (6.25 mg total) by mouth 2 (two) times daily. 60 tablet 3  . cyclobenzaprine (FLEXERIL) 10 MG tablet Take 10 mg by mouth 2 (two) times daily as needed.  0  . glipiZIDE (GLUCOTROL XL) 10 MG 24 hr tablet Take 10 mg by mouth daily with breakfast.   3  . lisinopril (PRINIVIL,ZESTRIL) 5 MG tablet Take 2.5 mg by mouth daily.    . repaglinide (PRANDIN) 0.5 MG tablet Take 1 tablet by mouth 3 (three) times daily with meals.   3  . simvastatin (ZOCOR) 40 MG tablet Take 0.5 tablets (20 mg total) by mouth at bedtime. 45 tablet 1  .  tamsulosin (FLOMAX) 0.4 MG CAPS capsule Take 0.4 mg by mouth at bedtime.   3   No current facility-administered medications for this visit.          Allergies  Allergen Reactions  . Fentanyl Shortness Of Breath  . Promethazine Hcl Other (See Comments)    Cardiac arrest  . Metformin And Related Diarrhea  . Penicillins Nausea Only    Has patient had a PCN reaction causing immediate rash, facial/tongue/throat swelling, SOB or lightheadedness with hypotension: no Has patient had a PCN reaction causing severe rash involving mucus membranes or skin necrosis: no Has patient had a PCN reaction that required hospitalization: no Has patient had a PCN reaction occurring within the last 10 years: yes If all of the above answers are "NO", then may proceed with Cephalosporin use.   . Sulfa Antibiotics Other (See Comments)    G.I. Upset  . Foeniculum Vulgare Other (See Comments)    Fennel bulbs    Review of Systems  Never smoker He has recovered from the stroke which caused transient left-sided weakness 2018 He has difficulty with short-term memory for new tasks or assignments He is a non-smoker. He denies recent history of upper respiratory infection or bronchitis No GI bleeding on Eliquis No syncope  or falls Intentional weight loss of approximately 15 to 18 pounds which has made him feel better. Previous depression earlier this year is now resolved after he stopped taking Lexapro which he had a poor reaction  BP 122/72 (BP Location: Right Arm, Patient Position: Sitting, Cuff Size: Normal)   Pulse (!) 59   Resp 18   Ht 5\' 11"  (1.803 m)   Wt 207 lb 6.4 oz (94.1 kg)   SpO2 98% Comment: RA  BMI 28.93 kg/m  Physical Exam      Exam    General- alert and comfortable    Neck- no JVD, no cervical adenopathy palpable, no carotid bruit   Lungs- clear without rales, wheezes   Cor-irregular rhythm of atrial fibrillation, 3/6 MR murmur ,no gallop   Abdomen- soft, non-tender   Extremities - warm, non-tender, minimal edema.  Poor pulse in right hand   Neuro- oriented, appropriate, no focal weakness   Diagnostic Tests: Previous TEE, coronary angiogram and cardiac CT images all personally reviewed and counseled with patient  Impression: Patient would benefit from multivessel CABG with mitral valve replacement and possible combined Maze procedure.  He will need:            Dental extractions for which he can stop Eliquis for several days and just take aspirin            PFTs and CT scan of chest with lung windows to assess for underlying pulmonary disease            He will need to  hold his Eliquis for at least 5 days prior to cardiac surgery.  Plan: Return in 3 weeks to schedule a date for surgery after he recovers from dental surgery.   Mikey Bussing, MD [facility] [phone]          Electronically signed by Nathan Perna, MD at 12/07/2017 12:43 PM

## 2017-12-26 NOTE — Transfer of Care (Signed)
Immediate Anesthesia Transfer of Care Note  Patient: Nathan PhilipsRandleman D Coe Jr.  Procedure(s) Performed: Extraction of tooth #'s 4,6-11, 18 -27, 30, and 31 with alveoloplasty (N/A Mouth)  Patient Location: PACU  Anesthesia Type:General  Level of Consciousness: awake, alert , oriented and patient cooperative  Airway & Oxygen Therapy: Patient Spontanous Breathing and Patient connected to nasal cannula oxygen  Post-op Assessment: Report given to RN, Post -op Vital signs reviewed and stable and Patient moving all extremities X 4  Post vital signs: Reviewed and stable  Last Vitals:  Vitals Value Taken Time  BP 127/76 12/26/2017 10:45 AM  Temp    Pulse 76 12/26/2017 10:49 AM  Resp 21 12/26/2017 10:49 AM  SpO2 97 % 12/26/2017 10:49 AM  Vitals shown include unvalidated device data.  Last Pain:  Vitals:   12/26/17 0720  TempSrc:   PainSc: 0-No pain      Patients Stated Pain Goal: 3 (12/26/17 0720)  Complications: No apparent anesthesia complications

## 2017-12-26 NOTE — Anesthesia Preprocedure Evaluation (Addendum)
Anesthesia Evaluation  Patient identified by MRN, date of birth, ID band Patient awake    Reviewed: Allergy & Precautions, NPO status , Patient's Chart, lab work & pertinent test results  Airway Mallampati: II  TM Distance: >3 FB Neck ROM: Full    Dental no notable dental hx. (+) Poor Dentition, Dental Advisory Given   Pulmonary neg pulmonary ROS, former smoker,    Pulmonary exam normal breath sounds clear to auscultation       Cardiovascular hypertension, Pt. on medications + CAD, + Peripheral Vascular Disease and +CHF  Normal cardiovascular exam+ dysrhythmias Atrial Fibrillation  Rhythm:Regular Rate:Normal  Left ventricle: The cavity size was mildly dilated. Systolic   function was moderately to severely reduced. The estimated   ejection fraction was in the range of 30% to 35%. Diffuse   hypokinesis. Akinesis of the inferolateral myocardium. Akinesis   of the lateral myocardium. - Aortic valve: No evidence of vegetation. There was trivial   regurgitation. - Mitral valve: Mobility of the posterior leaflet was restricted.   No evidence of vegetation. There was severe regurgitation. - Left atrium: The atrium was moderately dilated. No evidence of   thrombus in the atrial cavity or appendage. - Right atrium: No evidence of thrombus in the atrial cavity or   appendage. - Atrial septum: No defect or patent foramen ovale was identified. - Tricuspid valve: No evidence of vegetation. - Pulmonic valve: No evidence of vegetation.  Impressions:  - Akinesis of the inferolateral and lateral walls with remaining   myocardium hypokinetic; overall moderate to severe LV   dysfunction; moderate LAE; restricted posterior MV leaflet with   severe MR.   Neuro/Psych Anxiety CVA, Residual Symptoms negative neurological ROS  negative psych ROS   GI/Hepatic negative GI ROS, Neg liver ROS,   Endo/Other  diabetes, Type 2, Oral Hypoglycemic  Agents  Renal/GU negative Renal ROS  negative genitourinary   Musculoskeletal negative musculoskeletal ROS (+)   Abdominal   Peds negative pediatric ROS (+)  Hematology negative hematology ROS (+)   Anesthesia Other Findings Diabetic Periferal Neuropathy bilateral feet   Short term memory problems related to CVA  Reproductive/Obstetrics negative OB ROS                          Anesthesia Physical Anesthesia Plan  ASA: IV  Anesthesia Plan: General   Post-op Pain Management:    Induction: Intravenous  PONV Risk Score and Plan: 2 and Ondansetron, Dexamethasone and Treatment may vary due to age or medical condition  Airway Management Planned: Nasal ETT  Additional Equipment:   Intra-op Plan:   Post-operative Plan: Extubation in OR  Informed Consent: I have reviewed the patients History and Physical, chart, labs and discussed the procedure including the risks, benefits and alternatives for the proposed anesthesia with the patient or authorized representative who has indicated his/her understanding and acceptance.   Dental advisory given  Plan Discussed with: CRNA, Surgeon and Anesthesiologist  Anesthesia Plan Comments:        Anesthesia Quick Evaluation

## 2017-12-26 NOTE — Discharge Instructions (Signed)

## 2017-12-26 NOTE — Anesthesia Postprocedure Evaluation (Signed)
Anesthesia Post Note  Patient: Nathan PhilipsRandleman D Bramblett Jr.  Procedure(s) Performed: Extraction of tooth #'s 4,6-11, 18 -27, 30, and 31 with alveoloplasty (N/A Mouth)     Patient location during evaluation: PACU Anesthesia Type: General Level of consciousness: awake and alert Pain management: pain level controlled Vital Signs Assessment: post-procedure vital signs reviewed and stable Respiratory status: spontaneous breathing, nonlabored ventilation, respiratory function stable and patient connected to nasal cannula oxygen Cardiovascular status: blood pressure returned to baseline and stable Postop Assessment: no apparent nausea or vomiting Anesthetic complications: no    Last Vitals:  Vitals:   12/26/17 1200 12/26/17 1230  BP: 119/70 123/73  Pulse: 67 68  Resp: 18 15  Temp:    SpO2: 97% 96%    Last Pain:  Vitals:   12/26/17 1230  TempSrc:   PainSc: Asleep                 Nehemiah Montee S

## 2017-12-26 NOTE — Anesthesia Procedure Notes (Signed)
Procedure Name: Intubation Date/Time: 12/26/2017 8:54 AM Performed by: Rogelia BogaMueller, Ramar Nobrega P, CRNA Pre-anesthesia Checklist: Patient identified, Emergency Drugs available, Suction available, Patient being monitored and Timeout performed Patient Re-evaluated:Patient Re-evaluated prior to induction Oxygen Delivery Method: Circle system utilized Preoxygenation: Pre-oxygenation with 100% oxygen Induction Type: IV induction Ventilation: Mask ventilation without difficulty and Oral airway inserted - appropriate to patient size Laryngoscope Size: Glidescope and 4 Grade View: Grade I Nasal Tubes: Right, Nasal prep performed and Nasal Rae Tube size: 7.0 mm Number of attempts: 1 Airway Equipment and Method: Video-laryngoscopy Placement Confirmation: ETT inserted through vocal cords under direct vision,  positive ETCO2 and breath sounds checked- equal and bilateral Tube secured with: Tape Dental Injury: Teeth and Oropharynx as per pre-operative assessment

## 2017-12-26 NOTE — Op Note (Signed)
OPERATIVE REPORT  Patient:            Nathan [name]. Date of Birth:  06-Feb-1946 MRN:                [id number]   DATE OF PROCEDURE:  12/26/2017  PREOPERATIVE DIAGNOSES: 1.  Severe mitral regurgitation 2.  Pre-heart valve surgery dental protocol 3.  Chronic apical periodontitis 4.  Retained root segments 5.  Dental caries 6.  Chronic periodontitis 7.  Loose teeth 8.  Dental phobia  POSTOPERATIVE DIAGNOSES: 1.  Severe mitral regurgitation 2.  Pre-heart valve surgery dental protocol 3.  Chronic apical periodontitis 4.  Retained root segments 5.  Dental caries 6.  Chronic periodontitis 7.  Loose teeth 8.  Dental phobia  OPERATIONS: 1. Multiple extraction of tooth numbers 4, 6-11, 18-27, 30, and 31 2. 4 Quadrants of alveoloplasty 3. Gross debridement of remaining dentition   SURGEON: [name], DDS  ASSISTANT: Pearletha Alfred (dental assistant)  ANESTHESIA: General anesthesia via nasoendotracheal tube.  MEDICATIONS: 1.  Clindamycin 600 mg IV prior to invasive dental procedures. 2. Local anesthesia with a total utilization of 4 carpules each containing 34 mg of lidocaine with 0.017 mg of epinephrine as well as 2 carpules each containing 9 mg of bupivacaine with 0.009 mg of epinephrine.  SPECIMENS: There are 19 teeth that were discarded.  DRAINS: None  CULTURES: None  COMPLICATIONS: None  ESTIMATED BLOOD LOSS: 100 mLs.  INTRAVENOUS FLUIDS: 900 mLs of Lactated ringers solution.  INDICATIONS: The patient was recently diagnosed with severe mitral regurgitation.  A medically necessary dental consultation was then requested to evaluate poor dentition.  The patient was examined and treatment planned for extraction of remaining teeth with alveoloplasty in the operating room with general anesthesia.  This treatment plan was formulated to decrease the risks and complications associated with dental infection from affecting the patient's systemic health and  the anticipated heart valve surgery.  OPERATIVE FINDINGS: Patient was examined operating room number 12.  The teeth were identified for extraction. The patient was noted be affected by chronic apical periodontitis, retained root segments, dental caries, chronic periodontitis, loose teeth, and dental phobia.   DESCRIPTION OF PROCEDURE: Patient was brought to the main operating room number 12. Patient was then placed in the supine position on the operating table. General anesthesia was then induced per the anesthesia team. The patient was then prepped and draped in the usual manner for dental medicine procedure. A timeout was performed. The patient was identified and procedures were verified. A throat pack was placed at this time. The oral cavity was then thoroughly examined with the findings noted above. The patient was then ready for dental medicine procedure as follows:  Local anesthesia was then administered sequentially with a total utilization of 4 carpules each containing 34 mg of lidocaine with 0.017 mg of epinephrine as well as 2 carpules  each containing 9 mg bupivacaine with 0.009 mg of epinephrine.  The Maxillary left and right quadrants first approached. Anesthesia was then delivered utilizing infiltration with lidocaine with epinephrine. A #15 blade incision was then made from the distal of #2 and extended to the distal of #13.  A  surgical flap was then carefully reflected. The maxillary teeth were then subluxated with a series of straight elevators.  A surgical handpiece and bur and copious amounts of sterile water were used to remove buccal and interseptal bone around tooth numbers 4, 6, and 11 .  The maxillary teeth were then  again subluxated with a series straight elevators.  Tooth #4, 6, 7, 8, 9, 10, 11 were then removed with a 150 forceps without complications.  Alveoloplasty was then performed utilizing a ronguers and bone file. The surgical site was then irrigated with copious amounts  of sterile saline. The tissues were approximated and trimmed appropriately.  A piece of Surgifoam was placed in each of the extraction sockets as needed.  The maxillary right surgical site was then closed from the distal of #2 and extended the mesial #8 utilizing 3-0 chromic gut suture in a continuous interrupted suture technique x1.  The maxillary left surgical site was then closed from the distal of #13 extended the mesial #9 utilizing 3-0 chromic gut suture in a continuous interrupted suture technique x1.  At this point time, the mandibular quadrants were approached. The patient was given bilateral inferior alveolar nerve blocks and long buccal nerve blocks utilizing the bupivacaine with epinephrine. Further infiltration was then achieved utilizing the lidocaine with epinephrine. A 15 blade incision was then made from the distal of number 17 extended to the distal #31.  A surgical flap was then carefully reflected.  The lower teeth were then subluxated with a series of straight elevators.  A surgical handpiece and bur with copious amounts sterile water were then used to remove buccal and interseptal bone around tooth numbers 18, 22, and 27.  The lower teeth were then again subluxated with a series of straight elevators.  Tooth #18 was then removed with a 23 forceps without complications.  Tooth numbers 19, 20, 21, 22, 23, 24, 25, 26, 27, 30, and 32 were then removed with a 151 forceps without complications.  Alveoloplasty was then performed utilizing a rongeurs and bone file to help achieve primary closure. The tissues were approximated and trimmed appropriately. The surgical sites were then irrigated with copious amounts of sterile saline. The mandibular left surgical site was then closed from the distal of #31 and extended the mesial #24 utilizing 3-0 chromic gut suture in a continuous interrupted suture technique x1.  The mandibular right surgical site was then closed from the distal of #31 extended to the  mesial of #25 utilizing 3-0 chromic gut suture in a continuous interrupted suture technique x1.    At this point in time, the entire mouth was irrigated with copious amounts of sterile saline. The patient was examined for complications, seeing none, the dental medicine procedure was deemed to be complete. The throat pack was removed at this time. A series of 4 x 4 gauze moistened with Amicar 5% rinse were placed in the mouth to aid hemostasis. The patient was then handed over to the anesthesia team for final disposition. After an appropriate amount of time, the patient was extubated and taken to the postanesthsia care unit in good condition. All counts were correct for the dental medicine procedure.  The patient is to continue Amicar 5% rinses postoperatively.  Patient is to rinse with 10 mils every hour for the next 10 hours and a swish and spit manner.  Patient is to discontinue the use of Eliquis therapy until after seeing Dr. Kathlee NationsPeter Van Trigt on this coming Wednesday, 12/28/2017.  Dr. Kathlee NationsPeter Van Trigt will then proceed with heart valve surgery as indicated if no significant postoperative oral complications are present.  The patient is to follow-up with a dentist of his choice for fabrication of upper and lower complete dentures after adequate healing and once medically stable from the anticipated coronary artery bypass graft  and heart valve surgery.   [name], DDS.

## 2017-12-27 ENCOUNTER — Encounter (HOSPITAL_COMMUNITY): Payer: Self-pay | Admitting: Dentistry

## 2017-12-28 ENCOUNTER — Encounter: Payer: Self-pay | Admitting: Cardiothoracic Surgery

## 2017-12-30 ENCOUNTER — Telehealth: Payer: Self-pay

## 2017-12-30 NOTE — Telephone Encounter (Signed)
[name]'s family, Nathan [name] called to get clarification whether or not to start [name] back on his Eliquis after his teeth extraction surgery with Dr. Kristin [name] on 12/26/2017. Upon asking Dr. Donata ClayVan [name] the patient had taken one dose today 12/30/17 after being told to take it for 2 days and stop 2 days before his f/u appointment with Dr. Donata ClayVan [name].  Patient was then called back and advised to not take any Eliquis until seen in the office next Tuesday.  Nathan [name] understood and advised for [name] to take an aspirin daily as stated per Dr. Zenaida NieceVan [name]'s last note.

## 2018-01-03 ENCOUNTER — Other Ambulatory Visit: Payer: Self-pay | Admitting: Surgery

## 2018-01-03 ENCOUNTER — Encounter: Payer: Self-pay | Admitting: Cardiothoracic Surgery

## 2018-01-03 DIAGNOSIS — I712 Thoracic aortic aneurysm, without rupture, unspecified: Secondary | ICD-10-CM

## 2018-01-04 ENCOUNTER — Ambulatory Visit: Payer: Medicare Other | Admitting: Cardiothoracic Surgery

## 2018-01-04 ENCOUNTER — Encounter: Payer: Self-pay | Admitting: Cardiothoracic Surgery

## 2018-01-04 ENCOUNTER — Other Ambulatory Visit: Payer: Self-pay

## 2018-01-04 ENCOUNTER — Other Ambulatory Visit: Payer: Self-pay | Admitting: *Deleted

## 2018-01-04 VITALS — BP 100/60 | HR 51 | Resp 16 | Ht 71.0 in | Wt 202.0 lb

## 2018-01-04 DIAGNOSIS — Z01818 Encounter for other preprocedural examination: Secondary | ICD-10-CM

## 2018-01-04 DIAGNOSIS — I482 Chronic atrial fibrillation, unspecified: Secondary | ICD-10-CM

## 2018-01-04 DIAGNOSIS — I340 Nonrheumatic mitral (valve) insufficiency: Secondary | ICD-10-CM | POA: Diagnosis not present

## 2018-01-04 DIAGNOSIS — I255 Ischemic cardiomyopathy: Secondary | ICD-10-CM | POA: Diagnosis not present

## 2018-01-04 DIAGNOSIS — R0602 Shortness of breath: Secondary | ICD-10-CM

## 2018-01-04 DIAGNOSIS — I2510 Atherosclerotic heart disease of native coronary artery without angina pectoris: Secondary | ICD-10-CM

## 2018-01-04 DIAGNOSIS — I25118 Atherosclerotic heart disease of native coronary artery with other forms of angina pectoris: Secondary | ICD-10-CM

## 2018-01-04 NOTE — Progress Notes (Signed)
PCP is [name], MD Referring Provider is End, [name], MD  Chief Complaint  Patient presents with  . Follow-up    TO DISCUSS SURGERY AFTER DENTAL EXTRACTIONS    HPI: 72 year old diabetic ex-smoker returns for further discussion of his ischemic cardiomyopathy[EF 30%[three-vessel CAD, history of A. fib and embolic stroke.  Since last visit the patient has had several necrotic teeth removed by Dr. Kristin Bruins.  He still has sutures in place.  He has lost 10 pounds.  His activity level has dropped.  He does not appear to be ready for surgery soon.  He is scheduled to have the sutures removed later this week and will be advanced in his diet.  I recommended the patient become more active and reverse the weight loss trend and improve nutritional status before surgery.  We will proceed with setting a date for surgery on July 29 and obtain a preoperative CT scan of the chest and pre-CABG Dopplers as an outpatient.  He will remain off his Eliquis-he is in sinus rhythm.  He will continue his current meds including inserted aspirin amiodarone carvedilol glipizide lisinopril and Zocor.   Past Medical History:  Diagnosis Date  . Atrial fibrillation with RVR (HCC) 05/01/2017  . Cardiomyopathy (HCC) 05/01/2017  . CHF (congestive heart failure) (HCC)   . Chronic apical periodontitis   . 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 Coronary artery disease   . Dental caries   . DM (diabetes mellitus) type 2, uncontrolled, with ketoacidosis (HCC) 05/01/2017  . Essential hypertension 11/26/2016  . History of kidney stones   . HLD (hyperlipidemia) 05/01/2017  . IVCD (intraventricular conduction defect) 04/24/2015  . Loose, teeth   . Mitral valve regurgitation   . Mixed dyslipidemia 11/26/2016  . Periodontitis    Chronic  . Phobia of dental procedure   . 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 BRAIN SURGERY    . CARDIAC  CATHETERIZATION    . IR PERCUTANEOUS ART THROMBECTOMY/INFUSION INTRACRANIAL INC DIAG ANGIO  04/29/2017  . IR RADIOLOGIST EVAL & MGMT  05/17/2017  . IR US GUIDE VASC ACCESS LEFT  04/29/2017  . IR US GUIDE VASC ACCESS RIGHT  04/29/2017  . MULTIPLE EXTRACTIONS WITH ALVEOLOPLASTY N/A 12/26/2017   Procedure: Extraction of tooth #'s 4,6-11, 18 -27, 30, and 31 with alveoloplasty;  Surgeon: [name], DDS;  Location: [facility] OR;  Service: Oral Surgery;  Laterality: N/A;  . RADIOLOGY WITH ANESTHESIA N/A 04/29/2017   Procedure: IR WITH ANESTHESIA;  Surgeon: Radiologist, Medication, MD;  Location: [facility] OR;  Service: Radiology;  Laterality: N/A;  . RIGHT/LEFT HEART CATH AND CORONARY ANGIOGRAPHY N/A 08/18/2017   Procedure: RIGHT/LEFT HEART CATH AND CORONARY ANGIOGRAPHY;  Surgeon: [name], MD;  Location: [facility] INVASIVE CV LAB;  Service: Cardiovascular;  Laterality: N/A;  . TEE WITHOUT CARDIOVERSION N/A 09/22/2017   Procedure: TRANSESOPHAGEAL ECHOCARDIOGRAM (TEE);  Surgeon: [name], MD;  Location: [facility] ENDOSCOPY;  Service: Cardiovascular;  Laterality: N/A;  . TONSILLECTOMY      Family History  Problem Relation Age of Onset  . Hypertension Father   . Diabetes Father     Social History Social History   Tobacco Use  . Smoking status: Former Smoker    Last attempt to quit: 1994    Years since quitting: 25.5  . Smokeless tobacco: Never Used  Substance Use Topics  . Alcohol use: Yes    Alcohol/week: 1.8 oz  Types: 1 Glasses of wine, 1 Cans of beer, 1 Standard drinks or equivalent per week    Comment: mix drink  . Drug use: No    Current Outpatient Medications  Medication Sig Dispense Refill  . acetaminophen (TYLENOL) 325 MG tablet Take 650 mg by mouth every 6 (six) hours as needed (for pain.).     [name]. amiodarone (PACERONE) 200 MG tablet Take 1 tablet (200 mg total) by mouth daily. 90 tablet 3  . aspirin EC 81 MG tablet Take 81 mg by mouth daily.    . carvedilol (COREG) 6.25 MG tablet  Take 1 tablet (6.25 mg total) by mouth 2 (two) times daily. 60 tablet 3  . cyclobenzaprine (FLEXERIL) 10 MG tablet Take 10 mg by mouth 2 (two) times daily as needed.  0  . glipiZIDE (GLUCOTROL XL) 10 MG 24 hr tablet Take 10 mg by mouth daily with breakfast.   3  . HYDROcodone-acetaminophen (NORCO) 5-325 MG tablet Take 1-2 tablets by mouth every 6 (six) hours as needed for moderate pain or severe pain. 32 tablet 0  . lisinopril (PRINIVIL,ZESTRIL) 5 MG tablet Take 2.5 mg by mouth daily.    . repaglinide (PRANDIN) 0.5 MG tablet Take 1 tablet by mouth 3 (three) times daily with meals.   3  . simvastatin (ZOCOR) 40 MG tablet Take 0.5 tablets (20 mg total) by mouth at bedtime. 45 tablet 1  . tamsulosin (FLOMAX) 0.4 MG CAPS capsule Take 0.4 mg by mouth at bedtime.   3   No current facility-administered medications for this visit.     Allergies  Allergen Reactions  . Fentanyl Shortness Of Breath  . Promethazine Hcl Other (See Comments)    Cardiac arrest  . Metformin And Related Diarrhea  . Penicillins Nausea Only    Has patient had a PCN reaction causing immediate rash, facial/tongue/throat swelling, SOB or lightheadedness with hypotension: no Has patient had a PCN reaction causing severe rash involving mucus membranes or skin necrosis: no Has patient had a PCN reaction that required hospitalization: no Has patient had a PCN reaction occurring within the last 10 years: yes If all of the above answers are "NO", then may proceed with Cephalosporin use.   . Sulfa Antibiotics Other (See Comments)    G.I. Upset  . Foeniculum Vulgare Other (See Comments)    Fennel bulbs--nausea only    Review of Systems   Recovering from dental extraction-complete.  Still with weight loss, poor nutrition, and decreased activity.  BP 100/60 (BP Location: Left Arm, Patient Position: Sitting, Cuff Size: Large)   Pulse (!) 51   Resp 16   Ht 5\' 11"  (1.803 m)   Wt 202 lb (91.6 kg)   SpO2 99% Comment: ON RA  BMI  28.17 kg/m  Physical Exam Alert no distress Lungs clear No JVD Regular heart rhythm Grade 3/6 holosystolic murmur Neuro intact No peripheral edema  Diagnostic Tests: No new tests  Impression: Patient needs combined CABG and mitral valve replacement for ischemic mitral regurgitation and three-vessel CAD.  His nutritional and functional status needs to improve prior to surgery.  He will remain off his Eliquis and we will schedule surgery within the next 2 weeks.  Plan: Return on July 24 to discuss details of surgery and for final assessment of his readiness for combined CABG and mitral valve replacement   Nathan BussingPeter Van Trigt III, MD [facility] [phone]

## 2018-01-06 ENCOUNTER — Other Ambulatory Visit: Payer: Self-pay | Admitting: *Deleted

## 2018-01-06 ENCOUNTER — Encounter (HOSPITAL_COMMUNITY): Payer: Self-pay | Admitting: Dentistry

## 2018-01-06 ENCOUNTER — Ambulatory Visit (HOSPITAL_COMMUNITY): Payer: Self-pay | Admitting: Dentistry

## 2018-01-06 ENCOUNTER — Encounter: Payer: Self-pay | Admitting: *Deleted

## 2018-01-06 VITALS — BP 131/80 | HR 64 | Temp 97.9°F

## 2018-01-06 DIAGNOSIS — K08199 Complete loss of teeth due to other specified cause, unspecified class: Secondary | ICD-10-CM

## 2018-01-06 DIAGNOSIS — I25119 Atherosclerotic heart disease of native coronary artery with unspecified angina pectoris: Secondary | ICD-10-CM

## 2018-01-06 DIAGNOSIS — I340 Nonrheumatic mitral (valve) insufficiency: Secondary | ICD-10-CM

## 2018-01-06 DIAGNOSIS — K0820 Unspecified atrophy of edentulous alveolar ridge: Secondary | ICD-10-CM

## 2018-01-06 DIAGNOSIS — K08109 Complete loss of teeth, unspecified cause, unspecified class: Secondary | ICD-10-CM

## 2018-01-06 DIAGNOSIS — Z01818 Encounter for other preprocedural examination: Secondary | ICD-10-CM

## 2018-01-06 MED ORDER — HYDROCODONE-ACETAMINOPHEN 5-325 MG PO TABS: 1.0000 | ORAL_TABLET | ORAL | 0 refills | Status: DC | PRN

## 2018-01-06 NOTE — Patient Instructions (Signed)
PLAN: 1. Continue salt water rinses as needed to aid healing. Brush tongue daily. 2. Advanced diet as tolerated avoiding hard foods at this time. 3. We will renew prescription for the hydrocodone/acetaminophen 5/325. Patient is to take one tablet every 4 hours as needed for pain. #20 No refills 4. Patient is cleared for heart valve surgery in late July 2019. 5. Follow-up with a dentist of his choice for fabrication of upper lower complete dentures once medically stable from the anticipated heart valve surgery.   Charlynne Panderonald F. Rolen Conger, DDS

## 2018-01-06 NOTE — Progress Notes (Signed)
POST OPERATIVE NOTE:  01/06/2018 Nathan D [name] [id number]  VITALS: BP 131/80 (BP Location: Left Arm)   Pulse 64   Temp 97.9 F (36.6 C)   LABS:  Lab Results  Component Value Date   WBC 6.3 12/22/2017   HGB 13.7 12/22/2017   HCT 41.4 12/22/2017   MCV 96.1 12/22/2017   PLT 182 12/22/2017   BMET    Component Value Date/Time   NA 138 12/22/2017 0943   NA 135 08/12/2017 1142   K 4.2 12/22/2017 0943   CL 104 12/22/2017 0943   CO2 24 12/22/2017 0943   GLUCOSE 173 (H) 12/22/2017 0943   BUN 16 12/22/2017 0943   BUN 17 08/12/2017 1142   CREATININE 0.94 12/22/2017 0943   CALCIUM 9.2 12/22/2017 0943   GFRNONAA >60 12/22/2017 0943   GFRAA >60 12/22/2017 0943    Lab Results  Component Value Date   INR 1.08 12/26/2017   INR 1.1 08/12/2017   No results found for: PTT   Nathan D [name] is status post extraction of remaining teeth with alveoloplasty in the operating room on 12/26/2017. Patient now presents for evaluation of healing and  Suture removal.  SUBJECTIVE: Patient is still complaining of significant discomfort primarily with chewing. Patient is requesting additional pain medication until he has additional healing.   EXAM: There is no sign of infection, heme, or ooze. Patient is healing in by generalized primary closure. The area of #22 is healing in by secondary intention. Sutures are loosely intact. The patient is now completely edentulous. There is atrophy of the edentulous alveolar ridges.  PROCEDURE: The patient was given a chlorhexidine gluconate rinse for 30 seconds. Sutures were then removed without complication. Patient ave some discomfort during the suture removal procedure. Postop chlorhexidine rinse was used as well.  ASSESSMENT: Post operative course is consistent with dental procedures performed in the operating room with general anesthesia. Loss of teeth due to extraction. Patient is now edentulous. There is atrophy of the edentulous  alveolar ridges. Postoperative discomfort  PLAN: 1. Continue salt water rinses as needed to aid healing. Brush tongue daily. 2. Advanced diet as tolerated avoiding hard foods at this time. 3. We will renew prescription for the hydrocodone/acetaminophen 5/325. Patient is to take one tablet every 4 hours as needed for pain. #20 No refills 4. Patient is cleared for heart valve surgery in late July 2019. 5. Follow-up with a dentist of his choice for fabrication of upper lower complete dentures once medically stable from the anticipated heart valve surgery.   Charlynne Panderonald F. Ashford Clouse, DDS

## 2018-01-17 ENCOUNTER — Ambulatory Visit (HOSPITAL_COMMUNITY)
Admission: RE | Admit: 2018-01-17 | Discharge: 2018-01-17 | Disposition: A | Discharge status: Home / Self Care | Payer: Medicare Other | Source: Ambulatory Visit | Attending: Cardiothoracic Surgery | Admitting: Cardiothoracic Surgery

## 2018-01-17 ENCOUNTER — Telehealth: Payer: Self-pay | Admitting: *Deleted

## 2018-01-17 DIAGNOSIS — I25119 Atherosclerotic heart disease of native coronary artery with unspecified angina pectoris: Secondary | ICD-10-CM | POA: Insufficient documentation

## 2018-01-17 DIAGNOSIS — I6523 Occlusion and stenosis of bilateral carotid arteries: Secondary | ICD-10-CM | POA: Diagnosis not present

## 2018-01-17 DIAGNOSIS — I340 Nonrheumatic mitral (valve) insufficiency: Secondary | ICD-10-CM | POA: Diagnosis not present

## 2018-01-17 NOTE — Progress Notes (Signed)
Pre-op Cardiac Surgery  Carotid Findings:  ICA 1-39% bilaterally  Upper Extremity Right Left  Brachial Pressures 128 129  Radial Waveforms Triphasic Triphasic  Ulnar Waveforms Triphasic Triphasic  Palmar Arch (Allen's Test) RT: >50% w/ compression LT: Obliterate w/ compression RT:WLN LT: WNL    Nathan [name] 01/17/2018 3:21 PM

## 2018-01-17 NOTE — Telephone Encounter (Signed)
FYI: Pt wants to update Dr. Dulce SellarMunley on surgery. Pt is seeing Dr. Alla GermanVantright and will be getting triple bi-pass surgery to get aortic valve [facility].

## 2018-01-18 ENCOUNTER — Encounter: Payer: Self-pay | Admitting: Cardiothoracic Surgery

## 2018-01-18 ENCOUNTER — Other Ambulatory Visit: Payer: Self-pay

## 2018-01-18 ENCOUNTER — Ambulatory Visit: Payer: Medicare Other | Admitting: Cardiothoracic Surgery

## 2018-01-18 ENCOUNTER — Ambulatory Visit
Admission: RE | Admit: 2018-01-18 | Discharge: 2018-01-18 | Disposition: A | Discharge status: Home / Self Care | Payer: Medicare Other | Source: Ambulatory Visit | Attending: Cardiothoracic Surgery | Admitting: Cardiothoracic Surgery

## 2018-01-18 VITALS — BP 130/64 | HR 57 | Resp 16 | Ht 71.0 in | Wt 206.6 lb

## 2018-01-18 DIAGNOSIS — R0602 Shortness of breath: Secondary | ICD-10-CM

## 2018-01-18 DIAGNOSIS — I25118 Atherosclerotic heart disease of native coronary artery with other forms of angina pectoris: Secondary | ICD-10-CM | POA: Diagnosis not present

## 2018-01-18 DIAGNOSIS — Z01818 Encounter for other preprocedural examination: Secondary | ICD-10-CM

## 2018-01-18 DIAGNOSIS — I340 Nonrheumatic mitral (valve) insufficiency: Secondary | ICD-10-CM | POA: Diagnosis not present

## 2018-01-18 MED ORDER — IOPAMIDOL (ISOVUE-300) INJECTION 61%
75.0000 mL | Freq: Once | INTRAVENOUS | Status: AC | PRN
  Administered 2018-01-18: 75 mL via INTRAVENOUS

## 2018-01-18 NOTE — Progress Notes (Signed)
PCP is Hadley Penobbins, Robert A, MD Referring Provider is End, Cristal Deerhristopher, MD  Chief Complaint  Patient presents with  . Follow-up    to finalize surgery prep for CABG/MVR.....has received dental clearance  The patient returns for further discussion of this combined CABG and mitral valve replacement for ischemic cardiomyopathy, diabetes, severe three-vessel CAD, ejection fraction 30%  HPI: Patient returns for further discussion of his CAD and moderate to severe mitral regurgitation, severe LV dysfunction.  He has now recovered from his dental extractions of necrotic teeth.  His weight is recent by 5 pounds and he feels stronger.  CT scan of the chest shows no evidence of pneumonia edema or at risk pulmonary nodules-adenopathy.  Carotid scan shows no significant stenosis.  Patient is ready to have surgery scheduled at [facility] July 29- CABG x4 with mitral valve replacement with a bioprosthetic valve.  Patient has been off Eliquis since his dental surgery.  His mouth is healed.    june 2019   HPI: 72 year old diabetic ex-smoker returns for further discussion of his ischemic cardiomyopathy[EF 30%[three-vessel CAD, history of A. fib and embolic stroke.  Since last visit the patient has had several necrotic teeth removed by Dr. Kristin BruinsKulinski.  He still has sutures in place.  He has lost 10 pounds.  His activity level has dropped.  He does not appear to be ready for surgery soon.  He is scheduled to have the sutures removed later this week and will be advanced in his diet.  I recommended the patient become more active and reverse the weight loss trend and improve nutritional status before surgery.  We will proceed with setting a date for surgery on July 29 and obtain a preoperative CT scan of the chest and pre-CABG Dopplers as an outpatient.  He will remain off his Eliquis-he is in sinus rhythm.  He will continue his current meds including inserted aspirin amiodarone carvedilol glipizide lisinopril and  Zocor.     72 year old diabetic presents for recently diagnosed severe three-vessel CAD with ischemic cardiomyopathy EF 35%, moderate-severe ischemic mitral regurgitation and atrial fibrillation which caused an embolic right cerebral CVA treated with IR clot extraction and reversal of left-sided weakness November 2018.  Patient currently on Eliquis in rate controlled atrial fibrillation.  Following recovery from his stroke he underwent left and right heart catheterization by Dr. and which demonstrated severe three-vessel coronary disease with a diabetic pattern with chronic occlusion the RCA and 70% left main stenosis.  PA pressures and cardiac output were normal.  Patient currently is asymptomatic of chest pain or shortness of breath and is recovering well from the left-sided weakness of the stroke but still has short-term memory issues.  Patient is unsure of how long he has had atrial fibrillation.  He believes he had a heart murmur when he was evaluated for arm services       Past Medical History:  Diagnosis Date  . Atrial fibrillation with RVR (HCC) 05/01/2017  . Cardiomyopathy (HCC) 05/01/2017  . CHF (congestive heart failure) (HCC)   . 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 Coronary artery disease   . DM (diabetes mellitus) type 2, uncontrolled, with ketoacidosis (HCC) 05/01/2017  . Essential hypertension 11/26/2016  . History of kidney stones   . HLD (hyperlipidemia) 05/01/2017  . IVCD (intraventricular conduction defect) 04/24/2015  . Mitral valve regurgitation   . Mixed dyslipidemia 11/26/2016  . 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  BRAIN SURGERY    . CARDIAC CATHETERIZATION    . IR PERCUTANEOUS ART THROMBECTOMY/INFUSION INTRACRANIAL INC DIAG ANGIO  04/29/2017  . IR RADIOLOGIST EVAL & MGMT  05/17/2017  . IR US GUIDE VASC ACCESS LEFT  04/29/2017  . IR US GUIDE VASC ACCESS RIGHT  04/29/2017  .  MULTIPLE EXTRACTIONS WITH ALVEOLOPLASTY N/A 12/26/2017   Procedure: Extraction of tooth #'s 4,6-11, 18 -27, 30, and 31 with alveoloplasty;  Surgeon: [name], DDS;  Location: [facility] OR;  Service: Oral Surgery;  Laterality: N/A;  . RADIOLOGY WITH ANESTHESIA N/A 04/29/2017   Procedure: IR WITH ANESTHESIA;  Surgeon: Radiologist, Medication, MD;  Location: [facility] OR;  Service: Radiology;  Laterality: N/A;  . RIGHT/LEFT HEART CATH AND CORONARY ANGIOGRAPHY N/A 08/18/2017   Procedure: RIGHT/LEFT HEART CATH AND CORONARY ANGIOGRAPHY;  Surgeon: [name], MD;  Location: [facility] INVASIVE CV LAB;  Service: Cardiovascular;  Laterality: N/A;  . TEE WITHOUT CARDIOVERSION N/A 09/22/2017   Procedure: TRANSESOPHAGEAL ECHOCARDIOGRAM (TEE);  Surgeon: [name], MD;  [facility];  Service: Cardiovascular;  Laterality: N/A;  . TONSILLECTOMY      Family History  Problem Relation Age of Onset  . Hypertension Father   . Diabetes Father     Social History Social History   Tobacco Use  . Smoking status: Former Smoker    Last attempt to quit: 1994    Years since quitting: 25.5  . Smokeless tobacco: Never Used  Substance Use Topics  . Alcohol use: Yes    Alcohol/week: 1.8 oz    Types: 1 Glasses of wine, 1 Cans of beer, 1 Standard drinks or equivalent per week    Comment: mix drink  . Drug use: No    Current Outpatient Medications  Medication Sig Dispense Refill  . acetaminophen (TYLENOL) 325 MG tablet Take 650 mg by mouth every 6 (six) hours as needed (for pain.).     [name] amiodarone (PACERONE) 200 MG tablet Take 1 tablet (200 mg total) by mouth daily. 90 tablet 3  . aspirin EC 81 MG tablet Take 81 mg by mouth daily.    . carvedilol (COREG) 6.25 MG tablet Take 1 tablet (6.25 mg total) by mouth 2 (two) times daily. 60 tablet 3  . cyclobenzaprine (FLEXERIL) 10 MG tablet Take 10 mg by mouth 2 (two) times daily as needed.  0  . glipiZIDE (GLUCOTROL XL) 10 MG 24 hr tablet Take 10 mg by mouth  daily with breakfast.   3  . lisinopril (PRINIVIL,ZESTRIL) 5 MG tablet Take 2.5 mg by mouth daily.    . repaglinide (PRANDIN) 0.5 MG tablet Take 1 tablet by mouth 3 (three) times daily with meals.   3  . simvastatin (ZOCOR) 40 MG tablet Take 0.5 tablets (20 mg total) by mouth at bedtime. 45 tablet 1  . tamsulosin (FLOMAX) 0.4 MG CAPS capsule Take 0.4 mg by mouth at bedtime.   3   No current facility-administered medications for this visit.     Allergies  Allergen Reactions  . Fentanyl Shortness Of Breath  . Promethazine Hcl Other (See Comments)    Cardiac arrest  . Metformin And Related Diarrhea  . Penicillins Nausea Only    Has patient had a PCN reaction causing immediate rash, facial/tongue/throat swelling, SOB or lightheadedness with hypotension: no Has patient had a PCN reaction causing severe rash involving mucus membranes or skin necrosis: no Has patient had a PCN reaction that required hospitalization: no Has patient had a PCN reaction occurring within  the last 10 years: yes If all of the above answers are "NO", then may proceed with Cephalosporin use.   . Sulfa Antibiotics Other (See Comments)    G.I. Upset  . Foeniculum Vulgare Other (See Comments)    Fennel bulbs--nausea only    Review of Systems       Walking daily, gaining weight proximal 5 pounds, feeling stronger, able to eat regular food             Review of Systems :  [ y ] = yes, [  ] = no        General :  Weight gain [ y  ]    Weight loss  [   ]  Fatigue [  y]  Fever [  ]  Chills  [  ]                                          HEENT    Headache [  ]  Dizziness [  ]  Blurred vision [  ] Glaucoma  [  ]                          Nosebleeds [  ] Painful or loose teeth [  ]        Cardiac :  Chest pain/ pressure [  ]  Resting SOB [  ] exertional SOB [ y ]                        Orthopnea [  ]  Pedal edema  [  ]  Palpitations [  ] Syncope/presyncope [ ]                         Paroxysmal nocturnal dyspnea [   ]         Pulmonary : cough [  ]  wheezing [  ]  Hemoptysis [  ] Sputum [  ] Snoring [  ]                              Pneumothorax [  ]  Sleep apnea [  ]        GI : Vomiting [  ]  Dysphagia [  ]  Melena  [  ]  Abdominal pain [  ] BRBPR [  ]              Heart burn [  ]  Constipation [  ] Diarrhea  [  ] Colonoscopy [   ]        GU : Hematuria [  ]  Dysuria [  ]  Nocturia [  ] UTI's [  ]        Vascular : Claudication [  ]  Rest pain [  ]  DVT [  ] Vein stripping [  ] leg ulcers [  ]                          TIA [  ] Stroke [  ]  Varicose veins [  ]        NEURO :  Headaches  [  ] Seizures [  ] Vision changes [  ]  Paresthesias [  ]                                               Musculoskeletal :  Arthritis [  ] Gout  [  ]  Back pain [  ]  Joint pain [  ]        Skin :  Rash [  ]  Melanoma [  ] Sores [  ]        Heme : Bleeding problems [  ]Clotting Disorders [  ] Anemia [  ]Blood Transfusion [ ]         Endocrine : Diabetes [  ] Heat or Cold intolerance [  ] Polyuria [  ]excessive thirst [ ]         Psych : Depression [  ]  Anxiety [  ]  Psych hospitalizations [  ] Memory change [  ]                                                                            BP 130/64 (BP Location: Right Arm, Patient Position: Sitting, Cuff Size: Large)   Pulse (!) 57   Resp 16   Ht 5\' 11"  (1.803 m)   Wt 206 lb 9.6 oz (93.7 kg)   SpO2 99% Comment: ON RA  BMI 28.81 kg/m  Physical Exam       Exam    General- alert and comfortable    Neck- no JVD, no cervical adenopathy palpable, no carotid bruit   Lungs- clear without rales, wheezes   Cor-irregular rate and rhythm, 3/6 MR  murmur , no  gallop   Abdomen- soft, non-tender   Extremities - warm, non-tender, minimal edema   Neuro- oriented, appropriate, no focal weakness   Diagnostic Tests: CT scan images and report of carotid Dopplers personally reviewed with patient  Impression: Severe three-vessel coronary disease, ischemic  cardiomyopathy EF 30%, moderate to severe mitral regurgitation in a 72 year old diabetic.  PFTs are pending. Plan: CABG with bypass grafts to RCA, LAD, diagonal, OM, and mitral valve replacement and probable atrial clip.  Scheduled for July 29 at [facility].  Procedure indications benefits and risks discussed in detail with patient and wife.  Mikey Bussing, MD [facility] [phone]

## 2018-01-19 ENCOUNTER — Inpatient Hospital Stay (HOSPITAL_COMMUNITY): Admission: RE | Admit: 2018-01-19 | Payer: Self-pay | Source: Ambulatory Visit

## 2018-01-19 ENCOUNTER — Encounter (HOSPITAL_COMMUNITY): Payer: Self-pay

## 2018-01-19 ENCOUNTER — Other Ambulatory Visit: Payer: Self-pay

## 2018-01-19 ENCOUNTER — Encounter (HOSPITAL_COMMUNITY)
Admission: RE | Admit: 2018-01-19 | Discharge: 2018-01-19 | Disposition: A | Discharge status: Home / Self Care | Payer: Medicare Other | Source: Ambulatory Visit | Attending: Cardiothoracic Surgery | Admitting: Cardiothoracic Surgery

## 2018-01-19 ENCOUNTER — Ambulatory Visit (HOSPITAL_COMMUNITY)
Admission: RE | Admit: 2018-01-19 | Discharge: 2018-01-19 | Disposition: A | Discharge status: Home / Self Care | Payer: Medicare Other | Source: Ambulatory Visit | Attending: Cardiothoracic Surgery | Admitting: Cardiothoracic Surgery

## 2018-01-19 DIAGNOSIS — Z01818 Encounter for other preprocedural examination: Secondary | ICD-10-CM | POA: Insufficient documentation

## 2018-01-19 DIAGNOSIS — Z7984 Long term (current) use of oral hypoglycemic drugs: Secondary | ICD-10-CM | POA: Diagnosis not present

## 2018-01-19 DIAGNOSIS — J449 Chronic obstructive pulmonary disease, unspecified: Secondary | ICD-10-CM | POA: Insufficient documentation

## 2018-01-19 DIAGNOSIS — I110 Hypertensive heart disease with heart failure: Secondary | ICD-10-CM | POA: Diagnosis not present

## 2018-01-19 DIAGNOSIS — Z87891 Personal history of nicotine dependence: Secondary | ICD-10-CM | POA: Insufficient documentation

## 2018-01-19 DIAGNOSIS — Z833 Family history of diabetes mellitus: Secondary | ICD-10-CM | POA: Diagnosis not present

## 2018-01-19 DIAGNOSIS — I25119 Atherosclerotic heart disease of native coronary artery with unspecified angina pectoris: Secondary | ICD-10-CM

## 2018-01-19 DIAGNOSIS — I429 Cardiomyopathy, unspecified: Secondary | ICD-10-CM | POA: Insufficient documentation

## 2018-01-19 DIAGNOSIS — E785 Hyperlipidemia, unspecified: Secondary | ICD-10-CM | POA: Diagnosis not present

## 2018-01-19 DIAGNOSIS — Z8673 Personal history of transient ischemic attack (TIA), and cerebral infarction without residual deficits: Secondary | ICD-10-CM | POA: Diagnosis not present

## 2018-01-19 DIAGNOSIS — Z8249 Family history of ischemic heart disease and other diseases of the circulatory system: Secondary | ICD-10-CM | POA: Diagnosis not present

## 2018-01-19 DIAGNOSIS — Z79899 Other long term (current) drug therapy: Secondary | ICD-10-CM | POA: Insufficient documentation

## 2018-01-19 DIAGNOSIS — E1165 Type 2 diabetes mellitus with hyperglycemia: Secondary | ICD-10-CM | POA: Insufficient documentation

## 2018-01-19 DIAGNOSIS — I5042 Chronic combined systolic (congestive) and diastolic (congestive) heart failure: Secondary | ICD-10-CM | POA: Insufficient documentation

## 2018-01-19 DIAGNOSIS — Z01810 Encounter for preprocedural cardiovascular examination: Secondary | ICD-10-CM | POA: Insufficient documentation

## 2018-01-19 DIAGNOSIS — Z7982 Long term (current) use of aspirin: Secondary | ICD-10-CM | POA: Diagnosis not present

## 2018-01-19 DIAGNOSIS — I493 Ventricular premature depolarization: Secondary | ICD-10-CM | POA: Diagnosis not present

## 2018-01-19 DIAGNOSIS — E782 Mixed hyperlipidemia: Secondary | ICD-10-CM | POA: Diagnosis not present

## 2018-01-19 DIAGNOSIS — I340 Nonrheumatic mitral (valve) insufficiency: Secondary | ICD-10-CM | POA: Insufficient documentation

## 2018-01-19 LAB — COMPREHENSIVE METABOLIC PANEL
ALT: 58 U/L — ABNORMAL HIGH (ref 0–44)
AST: 40 U/L (ref 15–41)
Albumin: 3.7 g/dL (ref 3.5–5.0)
Alkaline Phosphatase: 80 U/L (ref 38–126)
Anion gap: 7 (ref 5–15)
BUN: 15 mg/dL (ref 8–23)
CO2: 24 mmol/L (ref 22–32)
Calcium: 9.1 mg/dL (ref 8.9–10.3)
Chloride: 108 mmol/L (ref 98–111)
Creatinine, Ser: 0.88 mg/dL (ref 0.61–1.24)
GFR calc Af Amer: >60 mL/min (ref >60)
GFR calc non Af Amer: >60 mL/min (ref >60)
Glucose, Bld: 145 mg/dL — ABNORMAL HIGH (ref 70–99)
Potassium: 3.9 mmol/L (ref 3.5–5.1)
Sodium: 139 mmol/L (ref 135–145)
Total Bilirubin: 0.7 mg/dL (ref 0.3–1.2)
Total Protein: 6.7 g/dL (ref 6.5–8.1)

## 2018-01-19 LAB — PULMONARY FUNCTION TEST
FEF 25-75 Pre: 1.37 L/sec
FEF2575-%Pred-Pre: 52 %
FEV1-%Pred-Pre: 36 %
FEV1-Pre: 1.27 L
FEV1FVC-%Pred-Pre: 112 %
FEV6-%Pred-Pre: 34 %
FEV6-Pre: 1.52 L
FEV6FVC-%Pred-Pre: 106 %
FVC-%Pred-Pre: 32 %
FVC-Pre: 1.52 L
Pre FEV1/FVC ratio: 83 %
Pre FEV6/FVC Ratio: 100 %

## 2018-01-19 LAB — CBC
HCT: 42.4 % (ref 39.0–52.0)
Hemoglobin: 13.5 g/dL (ref 13.0–17.0)
MCH: 30.7 pg (ref 26.0–34.0)
MCHC: 31.8 g/dL (ref 30.0–36.0)
MCV: 96.4 fL (ref 78.0–100.0)
Platelets: 184 10*3/uL (ref 150–400)
RBC: 4.4 MIL/uL (ref 4.22–5.81)
RDW: 12.8 % (ref 11.5–15.5)
WBC: 4.7 10*3/uL (ref 4.0–10.5)

## 2018-01-19 LAB — URINALYSIS, ROUTINE W REFLEX MICROSCOPIC
Bilirubin Urine: NEGATIVE
Glucose, UA: 150 mg/dL — AB
Hgb urine dipstick: NEGATIVE
Ketones, ur: NEGATIVE mg/dL
Leukocytes, UA: NEGATIVE
Nitrite: NEGATIVE
Protein, ur: NEGATIVE mg/dL
Specific Gravity, Urine: 1.024 (ref 1.005–1.030)
pH: 5 (ref 5.0–8.0)

## 2018-01-19 LAB — GLUCOSE, CAPILLARY: Glucose-Capillary: 141 mg/dL — ABNORMAL HIGH (ref 70–99)

## 2018-01-19 LAB — PROTIME-INR
INR: 1.11
Prothrombin Time: 14.2 seconds (ref 11.4–15.2)

## 2018-01-19 LAB — ABO/RH: ABO/RH(D): A POS

## 2018-01-19 LAB — HEMOGLOBIN A1C
Hgb A1c MFr Bld: 5.9 % — ABNORMAL HIGH (ref 4.8–5.6)
Mean Plasma Glucose: 122.63 mg/dL

## 2018-01-19 LAB — SURGICAL PCR SCREEN
MRSA, PCR: NEGATIVE
Staphylococcus aureus: NEGATIVE

## 2018-01-19 LAB — APTT: aPTT: 30 seconds (ref 24–36)

## 2018-01-19 NOTE — Pre-Procedure Instructions (Signed)
Nathan [name] Nathan [name] Nathan [name].  01/19/2018      [id number] - [name], [place] - [address] AT [facility] [address] [place] [address] Phone: [phone] Fax: [phone]    Your procedure is scheduled on Mon., January 23, 2018 from 7:30AM-8:18PM  Report to [facility] Admitting Entrance "A" at 5:30AM  Call this number if you have problems the morning of surgery:  [phone]   Remember:  Do not eat or drink after midnight on July 28th    Take these medicines the morning of surgery with A SIP OF WATER: Amiodarone (PACERONE) and carvedilol (COREG)   If needed Acetaminophen (TYLENOL) and Cyclobenzaprine (FLEXERIL)   Follow your surgeon's instructions on when to stop Asprin.  If no instructions were given by your surgeon then you will need to call the office to get those instructions.    As of today, stop taking all Other Aspirin Products, Vitamins, Fish oils, and Herbal medications. Also stop all NSAIDS i.e. Advil, Ibuprofen, Motrin, Aleve, Anaprox, Naproxen, BC, Goody Powders, and all Supplements.   How to Manage Your Diabetes Before and After Surgery  Why is it important to control my blood sugar before and after surgery? . Improving blood sugar levels before and after surgery helps healing and can limit problems. . A way of improving blood sugar control is eating a healthy diet by: o  Eating less sugar and carbohydrates o  Increasing activity/exercise o  Talking with your doctor about reaching your blood sugar goals . High blood sugars (greater than 180 mg/dL) can raise your risk of infections and slow your recovery, so you will need to focus on controlling your diabetes during the weeks before surgery. . Make sure that the doctor who takes care of your diabetes knows about your planned surgery including the date and location.  How do I manage my blood sugar before surgery? . Check your blood sugar at least  4 times a day, starting 2 days before surgery, to make sure that the level is not too high or low. o Check your blood sugar the morning of your surgery when you wake up and every 2 hours until you get to the Short Stay unit. . If your blood sugar is less than 70 mg/dL, you will need to treat for low blood sugar: o Do not take insulin. o Treat a low blood sugar (less than 70 mg/dL) with  cup of clear juice (cranberry or apple), 4 glucose tablets, OR glucose gel. Recheck blood sugar in 15 minutes after treatment (to make sure it is greater than 70 mg/dL). If your blood sugar is not greater than 70 mg/dL on recheck, call [phone] o  for further instructions. . Report your blood sugar to the short stay nurse when you get to Short Stay.  If your CBG is greater than 220 mg/dL, inform the staff upon arrival to Short Stay  . If you are admitted to the hospital after surgery: o Your blood sugar will be checked by the staff and you will probably be given insulin after surgery (instead of oral diabetes medicines) to make sure you have good blood sugar levels. o The goal for blood sugar control after surgery is 80-180 mg/dL.  WHAT DO I DO ABOUT MY DIABETES MEDICATION?  [name] Do not take GlipiZIDE (GLUCOTROL XL) and Repaglinide (PRANDIN) the morning of surgery.  Reviewed and Endorsed by [facility] Patient Education Committee, August  2015   Do not wear jewelry.  Do not wear lotions, powders, colognes, or deodorant.  Do not shave 48 hours prior to surgery.  Men may shave face.  Do not bring valuables to the hospital.  [facility] is not responsible for any belongings or valuables.  Contacts, dentures or bridgework may not be worn into surgery.  Leave your suitcase in the car.  After surgery it may be brought to your room.  For patients admitted to the hospital, discharge time will be determined by your treatment team.  Patients discharged the day of surgery will not be allowed to drive home.    Special instructions:   [place]- Preparing For Surgery  Before surgery, you can play an important role. Because skin is not sterile, your skin needs to be as free of germs as possible. You can reduce the number of germs on your skin by washing with CHG (chlorahexidine gluconate) Soap before surgery.  CHG is an antiseptic cleaner which kills germs and bonds with the skin to continue killing germs even after washing.    Oral Hygiene is also important to reduce your risk of infection.  Remember - BRUSH YOUR TEETH THE MORNING OF SURGERY WITH YOUR REGULAR TOOTHPASTE  Please do not use if you have an allergy to CHG or antibacterial soaps. If your skin becomes reddened/irritated stop using the CHG.  Do not shave (including legs and underarms) for at least 48 hours prior to first CHG shower. It is OK to shave your face.  Please follow these instructions carefully.   1. Shower the NIGHT BEFORE SURGERY and the MORNING OF SURGERY with CHG.   2. If you chose to wash your hair, wash your hair first as usual with your normal shampoo.  3. After you shampoo, rinse your hair and body thoroughly to remove the shampoo.  4. Use CHG as you would any other liquid soap. You can apply CHG directly to the skin and wash gently with a scrungie or a clean washcloth.   5. Apply the CHG Soap to your body ONLY FROM THE NECK DOWN.  Do not use on open wounds or open sores. Avoid contact with your eyes, ears, mouth and genitals (private parts). Wash Face and genitals (private parts)  with your normal soap.  6. Wash thoroughly, paying special attention to the area where your surgery will be performed.  7. Thoroughly rinse your body with warm water from the neck down.  8. DO NOT shower/wash with your normal soap after using and rinsing off the CHG Soap.  9. Pat yourself dry with a CLEAN TOWEL.  10. Wear CLEAN PAJAMAS to bed the night before surgery, wear comfortable clothes the morning of surgery  11. Place  CLEAN SHEETS on your bed the night of your first shower and DO NOT SLEEP WITH PETS.  Day of Surgery:  Do not apply any deodorants/lotions.  Please wear clean clothes to the hospital/surgery center.   Remember to brush your teeth WITH YOUR REGULAR TOOTHPASTE.  Please read over the following fact sheets that you were given. Pain Booklet, Coughing and Deep Breathing, MRSA Information and Surgical Site Infection Prevention

## 2018-01-19 NOTE — Progress Notes (Signed)
PCP - Dr. Keturah Barreobert Robbins  Cardiologist - Dr. Norman HerrlichBrian Munley  Chest x-ray - 01/19/18  EKG - 01/19/18  Stress Test - Denies  ECHO - 09/22/17 (E)  Cardiac Cath - 08/18/17 (E)  Sleep Study - Denies CPAP - None  LABS- 01/19/18: CBC, CMP, PT, PTT, ABG, T/S, PCR, UA  ASA- Continue  HA1C- 01/19/18 Fasting Blood Sugar - Today 141 Checks Blood Sugar _0____ times a day  Anesthesia- Yes- Previous note 12/22/17  Pt denies having chest pain, sob, or fever at this time. All instructions explained to the pt, with a verbal understanding of the material. Pt agrees to go over the instructions while at home for a better understanding. The opportunity to ask questions was provided.

## 2018-01-20 MED ORDER — INSULIN REGULAR HUMAN 100 UNIT/ML IJ SOLN
INTRAMUSCULAR | Status: AC
  Administered 2018-01-23: .9 [IU]/h via INTRAVENOUS
  Administered 2018-01-23: 1 [IU]/h via INTRAVENOUS
  Filled 2018-01-20: qty 1

## 2018-01-20 MED ORDER — MAGNESIUM SULFATE 50 % IJ SOLN
40.0000 meq | INTRAMUSCULAR | Status: DC
  Filled 2018-01-20: qty 9.85

## 2018-01-20 MED ORDER — POTASSIUM CHLORIDE 2 MEQ/ML IV SOLN
80.0000 meq | INTRAVENOUS | Status: DC
  Filled 2018-01-20: qty 40

## 2018-01-20 MED ORDER — EPINEPHRINE PF 1 MG/ML IJ SOLN
0.0000 ug/min | INTRAVENOUS | Status: DC
  Filled 2018-01-20: qty 4

## 2018-01-20 MED ORDER — DEXMEDETOMIDINE HCL IN NACL 400 MCG/100ML IV SOLN
0.1000 ug/kg/h | INTRAVENOUS | Status: AC
  Administered 2018-01-23: 0.7 ug/kg/h via INTRAVENOUS
  Filled 2018-01-20: qty 100

## 2018-01-20 MED ORDER — SODIUM CHLORIDE 0.9 % IV SOLN
30.0000 ug/min | INTRAVENOUS | Status: AC
  Administered 2018-01-23: 15 ug/min via INTRAVENOUS
  Filled 2018-01-20: qty 2

## 2018-01-20 MED ORDER — TRANEXAMIC ACID (OHS) PUMP PRIME SOLUTION
2.0000 mg/kg | INTRAVENOUS | Status: DC
  Filled 2018-01-20: qty 1.85

## 2018-01-20 MED ORDER — TRANEXAMIC ACID 1000 MG/10ML IV SOLN
1.5000 mg/kg/h | INTRAVENOUS | Status: AC
  Administered 2018-01-23: 1.5 mg/kg/h via INTRAVENOUS
  Filled 2018-01-20: qty 25

## 2018-01-20 MED ORDER — SODIUM CHLORIDE 0.9 % IV SOLN
750.0000 mg | INTRAVENOUS | Status: DC
  Filled 2018-01-20: qty 750

## 2018-01-20 MED ORDER — NITROGLYCERIN IN D5W 200-5 MCG/ML-% IV SOLN
2.0000 ug/min | INTRAVENOUS | Status: DC
  Filled 2018-01-20: qty 250

## 2018-01-20 MED ORDER — SODIUM CHLORIDE 0.9 % IV SOLN
1.5000 g | INTRAVENOUS | Status: AC
  Administered 2018-01-23: 1.5 g via INTRAVENOUS
  Filled 2018-01-20: qty 1.5

## 2018-01-20 MED ORDER — PLASMA-LYTE 148 IV SOLN
INTRAVENOUS | Status: AC
  Administered 2018-01-23: 500 mL
  Filled 2018-01-20: qty 2.5

## 2018-01-20 MED ORDER — DOPAMINE-DEXTROSE 3.2-5 MG/ML-% IV SOLN
0.0000 ug/kg/min | INTRAVENOUS | Status: DC
  Filled 2018-01-20: qty 250

## 2018-01-20 MED ORDER — MILRINONE LACTATE IN DEXTROSE 20-5 MG/100ML-% IV SOLN
0.1250 ug/kg/min | INTRAVENOUS | Status: AC
Start: 2018-01-23 — End: 2018-01-23
  Administered 2018-01-23: .25 ug/kg/min via INTRAVENOUS
  Filled 2018-01-20: qty 100

## 2018-01-20 MED ORDER — TRANEXAMIC ACID (OHS) BOLUS VIA INFUSION
15.0000 mg/kg | INTRAVENOUS | Status: AC
  Administered 2018-01-23: 1390.5 mg via INTRAVENOUS
  Filled 2018-01-20: qty 1391

## 2018-01-20 MED ORDER — HEPARIN SODIUM (PORCINE) 1000 UNIT/ML IJ SOLN
INTRAMUSCULAR | Status: DC
  Filled 2018-01-20: qty 30

## 2018-01-20 MED ORDER — SODIUM CHLORIDE 0.9 % IV SOLN
1500.0000 mg | INTRAVENOUS | Status: AC
  Administered 2018-01-23: 1500 mg via INTRAVENOUS
  Filled 2018-01-20: qty 1500

## 2018-01-22 NOTE — Anesthesia Preprocedure Evaluation (Addendum)
Anesthesia Evaluation  Patient identified by MRN, date of birth, ID band Patient awake    Reviewed: Allergy & Precautions, H&P , NPO status , Patient's Chart, lab work & pertinent test results, reviewed documented beta blocker date and time   Airway Mallampati: II  TM Distance: >3 FB Neck ROM: Full    Dental no notable dental hx. (+) Edentulous Upper, Edentulous Lower, Dental Advisory Given   Pulmonary neg pulmonary ROS, former smoker,    Pulmonary exam normal breath sounds clear to auscultation       Cardiovascular Exercise Tolerance: Good hypertension, Pt. on medications and Pt. on home beta blockers + CAD and +CHF  + dysrhythmias Atrial Fibrillation + Valvular Problems/Murmurs MR  Rhythm:Regular Rate:Normal     Neuro/Psych CVA negative psych ROS   GI/Hepatic negative GI ROS, Neg liver ROS,   Endo/Other  diabetes, Type 2, Oral Hypoglycemic Agents  Renal/GU negative Renal ROS  negative genitourinary   Musculoskeletal   Abdominal   Peds  Hematology negative hematology ROS (+)   Anesthesia Other Findings   Reproductive/Obstetrics negative OB ROS                            Anesthesia Physical Anesthesia Plan  ASA: IV  Anesthesia Plan: General   Post-op Pain Management:    Induction: Intravenous  PONV Risk Score and Plan: 2 and Midazolam and Treatment may vary due to age or medical condition  Airway Management Planned: Oral ETT  Additional Equipment: Arterial line, CVP, PA Cath, TEE, 3D TEE and Ultrasound Guidance Line Placement  Intra-op Plan:   Post-operative Plan: Post-operative intubation/ventilation  Informed Consent: I have reviewed the patients History and Physical, chart, labs and discussed the procedure including the risks, benefits and alternatives for the proposed anesthesia with the patient or authorized representative who has indicated his/her understanding and  acceptance.   Dental advisory given  Plan Discussed with: CRNA  Anesthesia Plan Comments:         Anesthesia Quick Evaluation

## 2018-01-23 ENCOUNTER — Inpatient Hospital Stay (HOSPITAL_COMMUNITY): Payer: Medicare Other

## 2018-01-23 ENCOUNTER — Inpatient Hospital Stay (HOSPITAL_COMMUNITY): Payer: Medicare Other | Admitting: Physician Assistant

## 2018-01-23 ENCOUNTER — Encounter (HOSPITAL_COMMUNITY): Payer: Self-pay | Admitting: *Deleted

## 2018-01-23 ENCOUNTER — Encounter (HOSPITAL_COMMUNITY)
Admission: RE | Disposition: A | Discharge status: Rehabilitation Facility | Payer: Self-pay | Source: Home / Self Care | Attending: Cardiothoracic Surgery

## 2018-01-23 ENCOUNTER — Other Ambulatory Visit: Payer: Self-pay

## 2018-01-23 ENCOUNTER — Inpatient Hospital Stay (HOSPITAL_COMMUNITY)
Admission: RE | Admit: 2018-01-23 | Discharge: 2018-02-03 | DRG: 220 | Disposition: A | Discharge status: Rehabilitation Facility | Payer: Medicare Other | Attending: Cardiothoracic Surgery | Admitting: Cardiothoracic Surgery

## 2018-01-23 DIAGNOSIS — I5042 Chronic combined systolic (congestive) and diastolic (congestive) heart failure: Secondary | ICD-10-CM

## 2018-01-23 DIAGNOSIS — I25119 Atherosclerotic heart disease of native coronary artery with unspecified angina pectoris: Secondary | ICD-10-CM | POA: Diagnosis not present

## 2018-01-23 DIAGNOSIS — R7309 Other abnormal glucose: Secondary | ICD-10-CM | POA: Diagnosis not present

## 2018-01-23 DIAGNOSIS — Z419 Encounter for procedure for purposes other than remedying health state, unspecified: Secondary | ICD-10-CM

## 2018-01-23 DIAGNOSIS — I69351 Hemiplegia and hemiparesis following cerebral infarction affecting right dominant side: Secondary | ICD-10-CM | POA: Diagnosis not present

## 2018-01-23 DIAGNOSIS — E876 Hypokalemia: Secondary | ICD-10-CM | POA: Diagnosis not present

## 2018-01-23 DIAGNOSIS — Z7984 Long term (current) use of oral hypoglycemic drugs: Secondary | ICD-10-CM | POA: Diagnosis not present

## 2018-01-23 DIAGNOSIS — Z7901 Long term (current) use of anticoagulants: Secondary | ICD-10-CM | POA: Diagnosis not present

## 2018-01-23 DIAGNOSIS — I6930 Unspecified sequelae of cerebral infarction: Secondary | ICD-10-CM | POA: Diagnosis not present

## 2018-01-23 DIAGNOSIS — I69311 Memory deficit following cerebral infarction: Secondary | ICD-10-CM | POA: Diagnosis not present

## 2018-01-23 DIAGNOSIS — Z882 Allergy status to sulfonamides status: Secondary | ICD-10-CM

## 2018-01-23 DIAGNOSIS — Z833 Family history of diabetes mellitus: Secondary | ICD-10-CM

## 2018-01-23 DIAGNOSIS — D62 Acute posthemorrhagic anemia: Secondary | ICD-10-CM | POA: Diagnosis not present

## 2018-01-23 DIAGNOSIS — I69354 Hemiplegia and hemiparesis following cerebral infarction affecting left non-dominant side: Secondary | ICD-10-CM | POA: Diagnosis not present

## 2018-01-23 DIAGNOSIS — I6601 Occlusion and stenosis of right middle cerebral artery: Secondary | ICD-10-CM | POA: Diagnosis not present

## 2018-01-23 DIAGNOSIS — Z8673 Personal history of transient ischemic attack (TIA), and cerebral infarction without residual deficits: Secondary | ICD-10-CM

## 2018-01-23 DIAGNOSIS — N401 Enlarged prostate with lower urinary tract symptoms: Secondary | ICD-10-CM | POA: Diagnosis present

## 2018-01-23 DIAGNOSIS — R338 Other retention of urine: Secondary | ICD-10-CM | POA: Diagnosis not present

## 2018-01-23 DIAGNOSIS — E119 Type 2 diabetes mellitus without complications: Secondary | ICD-10-CM | POA: Diagnosis not present

## 2018-01-23 DIAGNOSIS — Z87442 Personal history of urinary calculi: Secondary | ICD-10-CM

## 2018-01-23 DIAGNOSIS — Z953 Presence of xenogenic heart valve: Secondary | ICD-10-CM | POA: Diagnosis not present

## 2018-01-23 DIAGNOSIS — R0600 Dyspnea, unspecified: Secondary | ICD-10-CM

## 2018-01-23 DIAGNOSIS — I255 Ischemic cardiomyopathy: Secondary | ICD-10-CM | POA: Diagnosis present

## 2018-01-23 DIAGNOSIS — Z951 Presence of aortocoronary bypass graft: Secondary | ICD-10-CM

## 2018-01-23 DIAGNOSIS — E440 Moderate protein-calorie malnutrition: Secondary | ICD-10-CM

## 2018-01-23 DIAGNOSIS — K5900 Constipation, unspecified: Secondary | ICD-10-CM | POA: Diagnosis not present

## 2018-01-23 DIAGNOSIS — I340 Nonrheumatic mitral (valve) insufficiency: Principal | ICD-10-CM | POA: Diagnosis present

## 2018-01-23 DIAGNOSIS — D6959 Other secondary thrombocytopenia: Secondary | ICD-10-CM | POA: Diagnosis not present

## 2018-01-23 DIAGNOSIS — I10 Essential (primary) hypertension: Secondary | ICD-10-CM | POA: Diagnosis not present

## 2018-01-23 DIAGNOSIS — Z880 Allergy status to penicillin: Secondary | ICD-10-CM

## 2018-01-23 DIAGNOSIS — Z95828 Presence of other vascular implants and grafts: Secondary | ICD-10-CM

## 2018-01-23 DIAGNOSIS — Z79899 Other long term (current) drug therapy: Secondary | ICD-10-CM | POA: Diagnosis not present

## 2018-01-23 DIAGNOSIS — E782 Mixed hyperlipidemia: Secondary | ICD-10-CM | POA: Diagnosis present

## 2018-01-23 DIAGNOSIS — I2582 Chronic total occlusion of coronary artery: Secondary | ICD-10-CM | POA: Diagnosis present

## 2018-01-23 DIAGNOSIS — I2510 Atherosclerotic heart disease of native coronary artery without angina pectoris: Secondary | ICD-10-CM | POA: Diagnosis present

## 2018-01-23 DIAGNOSIS — K08409 Partial loss of teeth, unspecified cause, unspecified class: Secondary | ICD-10-CM | POA: Diagnosis present

## 2018-01-23 DIAGNOSIS — E871 Hypo-osmolality and hyponatremia: Secondary | ICD-10-CM

## 2018-01-23 DIAGNOSIS — R1310 Dysphagia, unspecified: Secondary | ICD-10-CM | POA: Diagnosis not present

## 2018-01-23 DIAGNOSIS — D689 Coagulation defect, unspecified: Secondary | ICD-10-CM | POA: Diagnosis not present

## 2018-01-23 DIAGNOSIS — J939 Pneumothorax, unspecified: Secondary | ICD-10-CM

## 2018-01-23 DIAGNOSIS — R8290 Unspecified abnormal findings in urine: Secondary | ICD-10-CM | POA: Diagnosis not present

## 2018-01-23 DIAGNOSIS — Z9889 Other specified postprocedural states: Secondary | ICD-10-CM

## 2018-01-23 DIAGNOSIS — I110 Hypertensive heart disease with heart failure: Secondary | ICD-10-CM | POA: Diagnosis present

## 2018-01-23 DIAGNOSIS — Z952 Presence of prosthetic heart valve: Secondary | ICD-10-CM

## 2018-01-23 DIAGNOSIS — Z452 Encounter for adjustment and management of vascular access device: Secondary | ICD-10-CM

## 2018-01-23 DIAGNOSIS — I4891 Unspecified atrial fibrillation: Secondary | ICD-10-CM

## 2018-01-23 DIAGNOSIS — Z23 Encounter for immunization: Secondary | ICD-10-CM | POA: Diagnosis present

## 2018-01-23 DIAGNOSIS — Z7982 Long term (current) use of aspirin: Secondary | ICD-10-CM

## 2018-01-23 DIAGNOSIS — I252 Old myocardial infarction: Secondary | ICD-10-CM | POA: Diagnosis not present

## 2018-01-23 DIAGNOSIS — Z87891 Personal history of nicotine dependence: Secondary | ICD-10-CM

## 2018-01-23 DIAGNOSIS — E162 Hypoglycemia, unspecified: Secondary | ICD-10-CM | POA: Diagnosis not present

## 2018-01-23 DIAGNOSIS — R5381 Other malaise: Secondary | ICD-10-CM | POA: Diagnosis not present

## 2018-01-23 DIAGNOSIS — I951 Orthostatic hypotension: Secondary | ICD-10-CM | POA: Diagnosis not present

## 2018-01-23 DIAGNOSIS — D638 Anemia in other chronic diseases classified elsewhere: Secondary | ICD-10-CM | POA: Diagnosis not present

## 2018-01-23 DIAGNOSIS — E11649 Type 2 diabetes mellitus with hypoglycemia without coma: Secondary | ICD-10-CM | POA: Diagnosis not present

## 2018-01-23 DIAGNOSIS — L8990 Pressure ulcer of unspecified site, unspecified stage: Secondary | ICD-10-CM

## 2018-01-23 DIAGNOSIS — A499 Bacterial infection, unspecified: Secondary | ICD-10-CM | POA: Diagnosis not present

## 2018-01-23 DIAGNOSIS — I5022 Chronic systolic (congestive) heart failure: Secondary | ICD-10-CM | POA: Diagnosis not present

## 2018-01-23 DIAGNOSIS — Z87892 Personal history of anaphylaxis: Secondary | ICD-10-CM

## 2018-01-23 DIAGNOSIS — I480 Paroxysmal atrial fibrillation: Secondary | ICD-10-CM | POA: Diagnosis present

## 2018-01-23 DIAGNOSIS — N390 Urinary tract infection, site not specified: Secondary | ICD-10-CM | POA: Diagnosis not present

## 2018-01-23 DIAGNOSIS — Z888 Allergy status to other drugs, medicaments and biological substances status: Secondary | ICD-10-CM

## 2018-01-23 DIAGNOSIS — Z8249 Family history of ischemic heart disease and other diseases of the circulatory system: Secondary | ICD-10-CM

## 2018-01-23 HISTORY — PX: CORONARY ARTERY BYPASS GRAFT: SHX141

## 2018-01-23 HISTORY — PX: MITRAL VALVE REPLACEMENT: SHX147

## 2018-01-23 HISTORY — DX: Presence of aortocoronary bypass graft: Z95.1

## 2018-01-23 HISTORY — PX: LEFT ATRIAL APPENDAGE OCCLUSION: EP1229

## 2018-01-23 HISTORY — PX: TEE WITHOUT CARDIOVERSION: SHX5443

## 2018-01-23 HISTORY — DX: Presence of prosthetic heart valve: Z95.2

## 2018-01-23 LAB — CBC
HCT: 22.9 % — ABNORMAL LOW (ref 39.0–52.0)
HEMATOCRIT: 24.7 % — AB (ref 39.0–52.0)
Hemoglobin: 8.3 g/dL — ABNORMAL LOW (ref 13.0–17.0)
Hemoglobin: 7.5 g/dL — ABNORMAL LOW (ref 13.0–17.0)
MCH: 31.8 pg (ref 26.0–34.0)
MCH: 31.3 pg (ref 26.0–34.0)
MCHC: 33.6 g/dL (ref 30.0–36.0)
MCHC: 32.8 g/dL (ref 30.0–36.0)
MCV: 94.6 fL (ref 78.0–100.0)
MCV: 95.4 fL (ref 78.0–100.0)
Platelets: 124 10*3/uL — ABNORMAL LOW (ref 150–400)
Platelets: 115 10*3/uL — ABNORMAL LOW (ref 150–400)
RBC: 2.61 MIL/uL — AB (ref 4.22–5.81)
RBC: 2.4 MIL/uL — ABNORMAL LOW (ref 4.22–5.81)
RDW: 13.2 % (ref 11.5–15.5)
RDW: 13.2 % (ref 11.5–15.5)
WBC: 13.3 10*3/uL — AB (ref 4.0–10.5)
WBC: 10.2 10*3/uL (ref 4.0–10.5)

## 2018-01-23 LAB — POCT I-STAT, CHEM 8
BUN: 14 mg/dL (ref 8–23)
BUN: 12 mg/dL (ref 8–23)
BUN: 10 mg/dL (ref 8–23)
BUN: 11 mg/dL (ref 8–23)
BUN: 11 mg/dL (ref 8–23)
BUN: 12 mg/dL (ref 8–23)
BUN: 13 mg/dL (ref 8–23)
BUN: 13 mg/dL (ref 8–23)
BUN: 12 mg/dL (ref 8–23)
BUN: 11 mg/dL (ref 8–23)
BUN: 11 mg/dL (ref 8–23)
Calcium, Ion: 1.23 mmol/L (ref 1.15–1.40)
Calcium, Ion: 1.19 mmol/L (ref 1.15–1.40)
Calcium, Ion: 1.03 mmol/L — ABNORMAL LOW (ref 1.15–1.40)
Calcium, Ion: 1 mmol/L — ABNORMAL LOW (ref 1.15–1.40)
Calcium, Ion: 1.08 mmol/L — ABNORMAL LOW (ref 1.15–1.40)
Calcium, Ion: 1.03 mmol/L — ABNORMAL LOW (ref 1.15–1.40)
Calcium, Ion: 1.07 mmol/L — ABNORMAL LOW (ref 1.15–1.40)
Calcium, Ion: 1 mmol/L — ABNORMAL LOW (ref 1.15–1.40)
Calcium, Ion: 0.84 mmol/L — CL (ref 1.15–1.40)
Calcium, Ion: 0.99 mmol/L — ABNORMAL LOW (ref 1.15–1.40)
Calcium, Ion: 1.1 mmol/L — ABNORMAL LOW (ref 1.15–1.40)
Chloride: 104 mmol/L (ref 98–111)
Chloride: 105 mmol/L (ref 98–111)
Chloride: 104 mmol/L (ref 98–111)
Chloride: 103 mmol/L (ref 98–111)
Chloride: 103 mmol/L (ref 98–111)
Chloride: 101 mmol/L (ref 98–111)
Chloride: 105 mmol/L (ref 98–111)
Chloride: 103 mmol/L (ref 98–111)
Chloride: 103 mmol/L (ref 98–111)
Chloride: 104 mmol/L (ref 98–111)
Chloride: 106 mmol/L (ref 98–111)
Creatinine, Ser: 0.6 mg/dL — ABNORMAL LOW (ref 0.61–1.24)
Creatinine, Ser: 0.7 mg/dL (ref 0.61–1.24)
Creatinine, Ser: 0.4 mg/dL — ABNORMAL LOW (ref 0.61–1.24)
Creatinine, Ser: 0.6 mg/dL — ABNORMAL LOW (ref 0.61–1.24)
Creatinine, Ser: 0.7 mg/dL (ref 0.61–1.24)
Creatinine, Ser: 0.5 mg/dL — ABNORMAL LOW (ref 0.61–1.24)
Creatinine, Ser: 0.7 mg/dL (ref 0.61–1.24)
Creatinine, Ser: 0.6 mg/dL — ABNORMAL LOW (ref 0.61–1.24)
Creatinine, Ser: 0.6 mg/dL — ABNORMAL LOW (ref 0.61–1.24)
Creatinine, Ser: 0.6 mg/dL — ABNORMAL LOW (ref 0.61–1.24)
Creatinine, Ser: 0.7 mg/dL (ref 0.61–1.24)
Glucose, Bld: 105 mg/dL — ABNORMAL HIGH (ref 70–99)
Glucose, Bld: 91 mg/dL (ref 70–99)
Glucose, Bld: 80 mg/dL (ref 70–99)
Glucose, Bld: 125 mg/dL — ABNORMAL HIGH (ref 70–99)
Glucose, Bld: 151 mg/dL — ABNORMAL HIGH (ref 70–99)
Glucose, Bld: 176 mg/dL — ABNORMAL HIGH (ref 70–99)
Glucose, Bld: 182 mg/dL — ABNORMAL HIGH (ref 70–99)
Glucose, Bld: 161 mg/dL — ABNORMAL HIGH (ref 70–99)
Glucose, Bld: 185 mg/dL — ABNORMAL HIGH (ref 70–99)
Glucose, Bld: 181 mg/dL — ABNORMAL HIGH (ref 70–99)
Glucose, Bld: 184 mg/dL — ABNORMAL HIGH (ref 70–99)
HCT: 33 % — ABNORMAL LOW (ref 39.0–52.0)
HCT: 31 % — ABNORMAL LOW (ref 39.0–52.0)
HCT: 27 % — ABNORMAL LOW (ref 39.0–52.0)
HCT: 26 % — ABNORMAL LOW (ref 39.0–52.0)
HCT: 28 % — ABNORMAL LOW (ref 39.0–52.0)
HCT: 26 % — ABNORMAL LOW (ref 39.0–52.0)
HCT: 27 % — ABNORMAL LOW (ref 39.0–52.0)
HCT: 23 % — ABNORMAL LOW (ref 39.0–52.0)
HCT: 22 % — ABNORMAL LOW (ref 39.0–52.0)
HCT: 26 % — ABNORMAL LOW (ref 39.0–52.0)
HCT: 20 % — ABNORMAL LOW (ref 39.0–52.0)
Hemoglobin: 11.2 g/dL — ABNORMAL LOW (ref 13.0–17.0)
Hemoglobin: 10.5 g/dL — ABNORMAL LOW (ref 13.0–17.0)
Hemoglobin: 9.2 g/dL — ABNORMAL LOW (ref 13.0–17.0)
Hemoglobin: 8.8 g/dL — ABNORMAL LOW (ref 13.0–17.0)
Hemoglobin: 9.5 g/dL — ABNORMAL LOW (ref 13.0–17.0)
Hemoglobin: 8.8 g/dL — ABNORMAL LOW (ref 13.0–17.0)
Hemoglobin: 9.2 g/dL — ABNORMAL LOW (ref 13.0–17.0)
Hemoglobin: 7.8 g/dL — ABNORMAL LOW (ref 13.0–17.0)
Hemoglobin: 7.5 g/dL — ABNORMAL LOW (ref 13.0–17.0)
Hemoglobin: 8.8 g/dL — ABNORMAL LOW (ref 13.0–17.0)
Hemoglobin: 6.8 g/dL — CL (ref 13.0–17.0)
Potassium: 3.6 mmol/L (ref 3.5–5.1)
Potassium: 3.7 mmol/L (ref 3.5–5.1)
Potassium: 3.8 mmol/L (ref 3.5–5.1)
Potassium: 3.8 mmol/L (ref 3.5–5.1)
Potassium: 4.1 mmol/L (ref 3.5–5.1)
Potassium: 4.4 mmol/L (ref 3.5–5.1)
Potassium: 4.6 mmol/L (ref 3.5–5.1)
Potassium: 5 mmol/L (ref 3.5–5.1)
Potassium: 3.8 mmol/L (ref 3.5–5.1)
Potassium: 3.7 mmol/L (ref 3.5–5.1)
Potassium: 3.5 mmol/L (ref 3.5–5.1)
Sodium: 140 mmol/L (ref 135–145)
Sodium: 141 mmol/L (ref 135–145)
Sodium: 140 mmol/L (ref 135–145)
Sodium: 141 mmol/L (ref 135–145)
Sodium: 145 mmol/L (ref 135–145)
Sodium: 139 mmol/L (ref 135–145)
Sodium: 140 mmol/L (ref 135–145)
Sodium: 139 mmol/L (ref 135–145)
Sodium: 143 mmol/L (ref 135–145)
Sodium: 143 mmol/L (ref 135–145)
Sodium: 142 mmol/L (ref 135–145)
TCO2: 27 mmol/L (ref 22–32)
TCO2: 25 mmol/L (ref 22–32)
TCO2: 25 mmol/L (ref 22–32)
TCO2: 31 mmol/L (ref 22–32)
TCO2: 35 mmol/L — ABNORMAL HIGH (ref 22–32)
TCO2: 29 mmol/L (ref 22–32)
TCO2: 32 mmol/L (ref 22–32)
TCO2: 29 mmol/L (ref 22–32)
TCO2: 26 mmol/L (ref 22–32)
TCO2: 28 mmol/L (ref 22–32)
TCO2: 22 mmol/L (ref 22–32)

## 2018-01-23 LAB — POCT I-STAT 3, ART BLOOD GAS (G3+)
Acid-Base Excess: 2 mmol/L (ref 0.0–2.0)
Acid-base deficit: 2 mmol/L (ref 0.0–2.0)
Acid-base deficit: 3 mmol/L — ABNORMAL HIGH (ref 0.0–2.0)
Acid-base deficit: 1 mmol/L (ref 0.0–2.0)
Bicarbonate: 23.3 mmol/L (ref 20.0–28.0)
Bicarbonate: 21.7 mmol/L (ref 20.0–28.0)
Bicarbonate: 25.7 mmol/L (ref 20.0–28.0)
Bicarbonate: 22.7 mmol/L (ref 20.0–28.0)
O2 Saturation: 100 %
O2 Saturation: 100 %
O2 Saturation: 100 %
O2 Saturation: 100 %
Patient temperature: 35.3
TCO2: 24 mmol/L (ref 22–32)
TCO2: 23 mmol/L (ref 22–32)
TCO2: 27 mmol/L (ref 22–32)
TCO2: 24 mmol/L (ref 22–32)
pCO2 arterial: 39.7 mmHg (ref 32.0–48.0)
pCO2 arterial: 35.9 mmHg (ref 32.0–48.0)
pCO2 arterial: 36.9 mmHg (ref 32.0–48.0)
pCO2 arterial: 32.1 mmHg (ref 32.0–48.0)
pH, Arterial: 7.376 (ref 7.350–7.450)
pH, Arterial: 7.388 (ref 7.350–7.450)
pH, Arterial: 7.451 — ABNORMAL HIGH (ref 7.350–7.450)
pH, Arterial: 7.451 — ABNORMAL HIGH (ref 7.350–7.450)
pO2, Arterial: 415 mmHg — ABNORMAL HIGH (ref 83.0–108.0)
pO2, Arterial: 472 mmHg — ABNORMAL HIGH (ref 83.0–108.0)
pO2, Arterial: 344 mmHg — ABNORMAL HIGH (ref 83.0–108.0)
pO2, Arterial: 201 mmHg — ABNORMAL HIGH (ref 83.0–108.0)

## 2018-01-23 LAB — BLOOD GAS, ARTERIAL
Acid-base deficit: 0.5 mmol/L (ref 0.0–2.0)
Bicarbonate: 23.9 mmol/L (ref 20.0–28.0)
Drawn by: 449841
FIO2: 21
O2 Saturation: 98.8 %
PATIENT TEMPERATURE: 98.6
PO2 ART: 151 mmHg — AB (ref 83.0–108.0)
pCO2 arterial: 41.1 mmHg (ref 32.0–48.0)
pH, Arterial: 7.382 (ref 7.350–7.450)

## 2018-01-23 LAB — HEMOGLOBIN AND HEMATOCRIT, BLOOD
HCT: 26.5 % — ABNORMAL LOW (ref 39.0–52.0)
Hemoglobin: 8.9 g/dL — ABNORMAL LOW (ref 13.0–17.0)

## 2018-01-23 LAB — POCT I-STAT 4, (NA,K, GLUC, HGB,HCT)
Glucose, Bld: 196 mg/dL — ABNORMAL HIGH (ref 70–99)
HCT: 24 % — ABNORMAL LOW (ref 39.0–52.0)
Hemoglobin: 8.2 g/dL — ABNORMAL LOW (ref 13.0–17.0)
Potassium: 3.5 mmol/L (ref 3.5–5.1)
Sodium: 142 mmol/L (ref 135–145)

## 2018-01-23 LAB — PROTIME-INR
INR: 1.16
Prothrombin Time: 14.7 seconds (ref 11.4–15.2)

## 2018-01-23 LAB — PLATELET COUNT: Platelets: 85 10*3/uL — ABNORMAL LOW (ref 150–400)

## 2018-01-23 LAB — MAGNESIUM: Magnesium: 3.2 mg/dL — ABNORMAL HIGH (ref 1.7–2.4)

## 2018-01-23 LAB — PREPARE RBC (CROSSMATCH)

## 2018-01-23 LAB — APTT: APTT: 38 s — AB (ref 24–36)

## 2018-01-23 LAB — GLUCOSE, CAPILLARY: Glucose-Capillary: 103 mg/dL — ABNORMAL HIGH (ref 70–99)

## 2018-01-23 LAB — CREATININE, SERUM
Creatinine, Ser: 1 mg/dL (ref 0.61–1.24)
GFR calc Af Amer: >60 mL/min (ref >60)
GFR calc non Af Amer: >60 mL/min (ref >60)

## 2018-01-23 SURGERY — REPLACEMENT, MITRAL VALVE
Anesthesia: General | Site: Chest

## 2018-01-23 MED ORDER — MORPHINE SULFATE (PF) 2 MG/ML IV SOLN
2.0000 mg | INTRAVENOUS | Status: DC | PRN
  Administered 2018-01-24: 2 mg via INTRAVENOUS
  Administered 2018-01-24: 4 mg via INTRAVENOUS
  Administered 2018-01-24: 2 mg via INTRAVENOUS
  Administered 2018-01-24: 4 mg via INTRAVENOUS
  Filled 2018-01-23 (×2): qty 1
  Filled 2018-01-23 (×2): qty 2

## 2018-01-23 MED ORDER — SODIUM CHLORIDE 0.9 % IV SOLN
INTRAVENOUS | Status: DC
  Administered 2018-01-23 (×2): via INTRAVENOUS

## 2018-01-23 MED ORDER — FENTANYL CITRATE (PF) 250 MCG/5ML IJ SOLN
INTRAMUSCULAR | Status: AC
  Filled 2018-01-23: qty 5

## 2018-01-23 MED ORDER — NOREPINEPHRINE BITARTRATE 1 MG/ML IV SOLN
INTRAVENOUS | Status: DC | PRN
  Administered 2018-01-23: 3 ug/min via INTRAVENOUS

## 2018-01-23 MED ORDER — NOREPINEPHRINE 4 MG/250ML-% IV SOLN
0.0000 ug/min | INTRAVENOUS | Status: DC
  Administered 2018-01-23: 10 ug/min via INTRAVENOUS
  Administered 2018-01-24: 5 ug/min via INTRAVENOUS
  Filled 2018-01-23 (×2): qty 250

## 2018-01-23 MED ORDER — SODIUM CHLORIDE 0.45 % IV SOLN
INTRAVENOUS | Status: DC | PRN
  Administered 2018-01-23 – 2018-01-25 (×4): via INTRAVENOUS

## 2018-01-23 MED ORDER — PHENYLEPHRINE HCL-NACL 20-0.9 MG/250ML-% IV SOLN
0.0000 ug/min | INTRAVENOUS | Status: DC
  Filled 2018-01-23: qty 250

## 2018-01-23 MED ORDER — SODIUM CHLORIDE 0.9 % IJ SOLN
INTRAMUSCULAR | Status: AC
  Filled 2018-01-23: qty 10

## 2018-01-23 MED ORDER — ARTIFICIAL TEARS OPHTHALMIC OINT
TOPICAL_OINTMENT | OPHTHALMIC | Status: AC
  Filled 2018-01-23: qty 3.5

## 2018-01-23 MED ORDER — MILRINONE LACTATE IN DEXTROSE 20-5 MG/100ML-% IV SOLN
0.1250 ug/kg/min | INTRAVENOUS | Status: DC
  Administered 2018-01-23 – 2018-01-24 (×2): 0.25 ug/kg/min via INTRAVENOUS
  Administered 2018-01-25 – 2018-01-29 (×5): 0.125 ug/kg/min via INTRAVENOUS
  Filled 2018-01-23 (×7): qty 100

## 2018-01-23 MED ORDER — MIDAZOLAM HCL 5 MG/5ML IJ SOLN
INTRAMUSCULAR | Status: DC | PRN
  Administered 2018-01-23 (×2): 2 mg via INTRAVENOUS
  Administered 2018-01-23: 3 mg via INTRAVENOUS
  Administered 2018-01-23: 2 mg via INTRAVENOUS
  Administered 2018-01-23: 1 mg via INTRAVENOUS
  Administered 2018-01-23: 2 mg via INTRAVENOUS

## 2018-01-23 MED ORDER — COAGULATION FACTOR VIIA RECOMB 1 MG IV SOLR
45.0000 ug/kg | Freq: Once | INTRAVENOUS | Status: AC
  Administered 2018-01-23: 2 mg via INTRAVENOUS
  Filled 2018-01-23: qty 4

## 2018-01-23 MED ORDER — ACETAMINOPHEN 160 MG/5ML PO SOLN: 650.0000 mg | Freq: Once | ORAL | Status: AC

## 2018-01-23 MED ORDER — 0.9 % SODIUM CHLORIDE (POUR BTL) OPTIME
TOPICAL | Status: DC | PRN
  Administered 2018-01-23: 5000 mL
  Administered 2018-01-23: 2000 mL
  Administered 2018-01-23: 1000 mL

## 2018-01-23 MED ORDER — POTASSIUM CHLORIDE 10 MEQ/50ML IV SOLN
10.0000 meq | INTRAVENOUS | Status: AC
  Administered 2018-01-23 – 2018-01-24 (×3): 10 meq via INTRAVENOUS
  Filled 2018-01-23 (×2): qty 50

## 2018-01-23 MED ORDER — PROTAMINE SULFATE 10 MG/ML IV SOLN
INTRAVENOUS | Status: AC
  Filled 2018-01-23: qty 5

## 2018-01-23 MED ORDER — ORAL CARE MOUTH RINSE
15.0000 mL | OROMUCOSAL | Status: DC
  Administered 2018-01-23 – 2018-01-24 (×4): 15 mL via OROMUCOSAL

## 2018-01-23 MED ORDER — MAGNESIUM SULFATE 4 GM/100ML IV SOLN
4.0000 g | Freq: Once | INTRAVENOUS | Status: AC
  Administered 2018-01-23: 4 g via INTRAVENOUS
  Filled 2018-01-23: qty 100

## 2018-01-23 MED ORDER — ARTIFICIAL TEARS OPHTHALMIC OINT
TOPICAL_OINTMENT | OPHTHALMIC | Status: DC | PRN
  Administered 2018-01-23: 1 via OPHTHALMIC

## 2018-01-23 MED ORDER — SODIUM CHLORIDE 0.9% IV SOLUTION
Freq: Once | INTRAVENOUS | Status: AC
  Administered 2018-01-23: 23:00:00 via INTRAVENOUS

## 2018-01-23 MED ORDER — METOPROLOL TARTRATE 12.5 MG HALF TABLET
12.5000 mg | ORAL_TABLET | Freq: Two times a day (BID) | ORAL | Status: DC
  Administered 2018-01-24: 12.5 mg via ORAL
  Filled 2018-01-23: qty 1

## 2018-01-23 MED ORDER — VANCOMYCIN HCL IN DEXTROSE 1-5 GM/200ML-% IV SOLN
1000.0000 mg | Freq: Two times a day (BID) | INTRAVENOUS | Status: DC
Start: 2018-01-23 — End: 2018-01-24
  Administered 2018-01-23 – 2018-01-24 (×2): 1000 mg via INTRAVENOUS
  Filled 2018-01-23 (×2): qty 200

## 2018-01-23 MED ORDER — HEMOSTATIC AGENTS (NO CHARGE) OPTIME
TOPICAL | Status: DC | PRN
  Administered 2018-01-23: 1 via TOPICAL

## 2018-01-23 MED ORDER — METOPROLOL TARTRATE 25 MG/10 ML ORAL SUSPENSION: 12.5000 mg | Freq: Two times a day (BID) | ORAL | Status: DC

## 2018-01-23 MED ORDER — CALCIUM CHLORIDE 10 % IV SOLN
INTRAVENOUS | Status: DC | PRN
  Administered 2018-01-23 (×6): 100 mg via INTRAVENOUS

## 2018-01-23 MED ORDER — INSULIN REGULAR BOLUS VIA INFUSION
0.0000 [IU] | Freq: Three times a day (TID) | INTRAVENOUS | Status: DC
  Filled 2018-01-23: qty 10

## 2018-01-23 MED ORDER — MIDAZOLAM HCL 10 MG/2ML IJ SOLN
INTRAMUSCULAR | Status: AC
  Filled 2018-01-23: qty 2

## 2018-01-23 MED ORDER — SODIUM CHLORIDE 0.9% FLUSH
10.0000 mL | Freq: Two times a day (BID) | INTRAVENOUS | Status: DC
  Administered 2018-01-23 – 2018-01-25 (×3): 10 mL

## 2018-01-23 MED ORDER — PROPOFOL 10 MG/ML IV BOLUS
INTRAVENOUS | Status: AC
  Filled 2018-01-23: qty 20

## 2018-01-23 MED ORDER — HEPARIN SODIUM (PORCINE) 1000 UNIT/ML IJ SOLN
INTRAMUSCULAR | Status: DC | PRN
  Administered 2018-01-23 (×2): 2000 [IU] via INTRAVENOUS
  Administered 2018-01-23: 28000 [IU] via INTRAVENOUS

## 2018-01-23 MED ORDER — PROTAMINE SULFATE 10 MG/ML IV SOLN
INTRAVENOUS | Status: DC | PRN
  Administered 2018-01-23: 50 mg via INTRAVENOUS
  Administered 2018-01-23: 270 mg via INTRAVENOUS
  Administered 2018-01-23: 10 mg via INTRAVENOUS

## 2018-01-23 MED ORDER — ACETAMINOPHEN 650 MG RE SUPP
650.0000 mg | Freq: Once | RECTAL | Status: AC
  Administered 2018-01-23: 650 mg via RECTAL

## 2018-01-23 MED ORDER — SODIUM CHLORIDE 0.9% FLUSH: 3.0000 mL | INTRAVENOUS | Status: DC | PRN

## 2018-01-23 MED ORDER — CHLORHEXIDINE GLUCONATE 0.12 % MT SOLN
15.0000 mL | Freq: Once | OROMUCOSAL | Status: AC
  Administered 2018-01-23: 15 mL via OROMUCOSAL

## 2018-01-23 MED ORDER — AMIODARONE HCL IN DEXTROSE 360-4.14 MG/200ML-% IV SOLN
60.0000 mg/h | INTRAVENOUS | Status: AC
  Administered 2018-01-23: 60 mg/h via INTRAVENOUS
  Filled 2018-01-23: qty 200

## 2018-01-23 MED ORDER — EPINEPHRINE PF 1 MG/ML IJ SOLN
INTRAVENOUS | Status: DC | PRN
  Administered 2018-01-23: 2 ug/min via INTRAVENOUS

## 2018-01-23 MED ORDER — ASPIRIN 81 MG PO CHEW
324.0000 mg | CHEWABLE_TABLET | Freq: Every day | ORAL | Status: DC
  Administered 2018-01-30 (×2): 324 mg
  Filled 2018-01-23 (×2): qty 4

## 2018-01-23 MED ORDER — HEMOSTATIC AGENTS (NO CHARGE) OPTIME
TOPICAL | Status: DC | PRN
  Administered 2018-01-23 (×2): 1 via TOPICAL

## 2018-01-23 MED ORDER — ROCURONIUM BROMIDE 10 MG/ML (PF) SYRINGE
PREFILLED_SYRINGE | INTRAVENOUS | Status: AC
  Filled 2018-01-23: qty 30

## 2018-01-23 MED ORDER — EPINEPHRINE PF 1 MG/ML IJ SOLN
1.0000 ug/min | INTRAVENOUS | Status: DC
  Administered 2018-01-24 (×2): 5 ug/min via INTRAVENOUS
  Administered 2018-01-25: 3 ug/min via INTRAVENOUS
  Administered 2018-01-25: 5 ug/min via INTRAVENOUS
  Administered 2018-01-26 – 2018-01-27 (×2): 4 ug/min via INTRAVENOUS
  Administered 2018-01-28: 3 ug/min via INTRAVENOUS
  Administered 2018-01-29: 1 ug/min via INTRAVENOUS
  Filled 2018-01-23 (×8): qty 4

## 2018-01-23 MED ORDER — SODIUM CHLORIDE 0.9 % IV SOLN
20.0000 ug | INTRAVENOUS | Status: AC
  Administered 2018-01-23: 20 ug via INTRAVENOUS
  Filled 2018-01-23: qty 5

## 2018-01-23 MED ORDER — LACTATED RINGERS IV SOLN
INTRAVENOUS | Status: DC | PRN
  Administered 2018-01-23: 07:00:00 via INTRAVENOUS

## 2018-01-23 MED ORDER — CHLORHEXIDINE GLUCONATE CLOTH 2 % EX PADS
6.0000 | MEDICATED_PAD | Freq: Every day | CUTANEOUS | Status: DC
  Administered 2018-01-23 – 2018-02-03 (×10): 6 via TOPICAL

## 2018-01-23 MED ORDER — ROCURONIUM BROMIDE 10 MG/ML (PF) SYRINGE
PREFILLED_SYRINGE | INTRAVENOUS | Status: DC | PRN
  Administered 2018-01-23: 100 mg via INTRAVENOUS
  Administered 2018-01-23: 50 mg via INTRAVENOUS
  Administered 2018-01-23: 20 mg via INTRAVENOUS
  Administered 2018-01-23: 100 mg via INTRAVENOUS

## 2018-01-23 MED ORDER — MIDAZOLAM HCL 2 MG/2ML IJ SOLN
INTRAMUSCULAR | Status: AC
  Filled 2018-01-23: qty 2

## 2018-01-23 MED ORDER — PANTOPRAZOLE SODIUM 40 MG PO TBEC
40.0000 mg | DELAYED_RELEASE_TABLET | Freq: Every day | ORAL | Status: DC
  Administered 2018-01-25 – 2018-02-03 (×10): 40 mg via ORAL
  Filled 2018-01-23 (×10): qty 1

## 2018-01-23 MED ORDER — POTASSIUM CHLORIDE 10 MEQ/50ML IV SOLN
10.0000 meq | INTRAVENOUS | Status: AC
  Administered 2018-01-23 (×2): 10 meq via INTRAVENOUS
  Filled 2018-01-23 (×3): qty 50

## 2018-01-23 MED ORDER — CHLORHEXIDINE GLUCONATE 0.12 % MT SOLN
15.0000 mL | OROMUCOSAL | Status: AC
  Administered 2018-01-23: 15 mL via OROMUCOSAL
  Filled 2018-01-23: qty 15

## 2018-01-23 MED ORDER — COAGULATION FACTOR VIIA RECOMB 1 MG IV SOLR
45.0000 ug/kg | Freq: Once | INTRAVENOUS | Status: DC
  Filled 2018-01-23: qty 4

## 2018-01-23 MED ORDER — AMIODARONE HCL IN DEXTROSE 360-4.14 MG/200ML-% IV SOLN
60.0000 mg/h | INTRAVENOUS | Status: DC
  Administered 2018-01-23: 60 mg/h via INTRAVENOUS
  Filled 2018-01-23: qty 200

## 2018-01-23 MED ORDER — VANCOMYCIN HCL IN DEXTROSE 1-5 GM/200ML-% IV SOLN
1000.0000 mg | Freq: Once | INTRAVENOUS | Status: DC
  Filled 2018-01-23: qty 200

## 2018-01-23 MED ORDER — POTASSIUM CHLORIDE 10 MEQ/50ML IV SOLN
10.0000 meq | INTRAVENOUS | Status: AC
  Administered 2018-01-23 (×3): 10 meq via INTRAVENOUS

## 2018-01-23 MED ORDER — SODIUM CHLORIDE 0.9 % IV SOLN
INTRAVENOUS | Status: DC | PRN
  Administered 2018-01-23: 750 mg via INTRAVENOUS

## 2018-01-23 MED ORDER — DOCUSATE SODIUM 100 MG PO CAPS
200.0000 mg | ORAL_CAPSULE | Freq: Every day | ORAL | Status: DC
  Administered 2018-01-24 – 2018-01-28 (×5): 200 mg via ORAL
  Filled 2018-01-23 (×5): qty 2

## 2018-01-23 MED ORDER — LACTATED RINGERS IV SOLN
INTRAVENOUS | Status: DC | PRN
  Administered 2018-01-23 (×2): via INTRAVENOUS

## 2018-01-23 MED ORDER — ALBUMIN HUMAN 5 % IV SOLN
250.0000 mL | INTRAVENOUS | Status: AC | PRN
  Administered 2018-01-23 (×3): 250 mL via INTRAVENOUS
  Filled 2018-01-23: qty 250

## 2018-01-23 MED ORDER — PROTAMINE SULFATE 10 MG/ML IV SOLN
INTRAVENOUS | Status: AC
  Filled 2018-01-23: qty 25

## 2018-01-23 MED ORDER — MIDAZOLAM HCL 2 MG/2ML IJ SOLN: 2.0000 mg | INTRAMUSCULAR | Status: DC | PRN

## 2018-01-23 MED ORDER — NOREPINEPHRINE 4 MG/250ML-% IV SOLN
0.0000 ug/min | INTRAVENOUS | Status: DC
  Filled 2018-01-23: qty 250

## 2018-01-23 MED ORDER — TRAMADOL HCL 50 MG PO TABS
50.0000 mg | ORAL_TABLET | ORAL | Status: DC | PRN
  Administered 2018-01-26: 50 mg via ORAL
  Administered 2018-01-27 – 2018-01-29 (×2): 100 mg via ORAL
  Filled 2018-01-23: qty 2
  Filled 2018-01-23: qty 1
  Filled 2018-01-23: qty 2

## 2018-01-23 MED ORDER — CALCIUM CHLORIDE 10 % IV SOLN
INTRAVENOUS | Status: AC
  Filled 2018-01-23: qty 10

## 2018-01-23 MED ORDER — OXYCODONE HCL 5 MG PO TABS
5.0000 mg | ORAL_TABLET | ORAL | Status: DC | PRN
  Administered 2018-01-24 – 2018-01-25 (×5): 10 mg via ORAL
  Filled 2018-01-23 (×5): qty 2

## 2018-01-23 MED ORDER — LACTATED RINGERS IV SOLN
INTRAVENOUS | Status: DC
  Administered 2018-01-24: 18:00:00 via INTRAVENOUS

## 2018-01-23 MED ORDER — ALBUMIN HUMAN 5 % IV SOLN
INTRAVENOUS | Status: DC | PRN
  Administered 2018-01-23 (×3): via INTRAVENOUS

## 2018-01-23 MED ORDER — BISACODYL 5 MG PO TBEC
10.0000 mg | DELAYED_RELEASE_TABLET | Freq: Every day | ORAL | Status: DC
  Administered 2018-01-24 – 2018-02-03 (×10): 10 mg via ORAL
  Filled 2018-01-23 (×10): qty 2

## 2018-01-23 MED ORDER — CHLORHEXIDINE GLUCONATE 0.12% ORAL RINSE (MEDLINE KIT)
15.0000 mL | Freq: Two times a day (BID) | OROMUCOSAL | Status: DC
  Administered 2018-01-23: 15 mL via OROMUCOSAL

## 2018-01-23 MED ORDER — BISACODYL 10 MG RE SUPP: 10.0000 mg | Freq: Every day | RECTAL | Status: DC

## 2018-01-23 MED ORDER — PHENYLEPHRINE 40 MCG/ML (10ML) SYRINGE FOR IV PUSH (FOR BLOOD PRESSURE SUPPORT)
PREFILLED_SYRINGE | INTRAVENOUS | Status: AC
  Filled 2018-01-23: qty 10

## 2018-01-23 MED ORDER — HEPARIN SODIUM (PORCINE) 1000 UNIT/ML IJ SOLN
INTRAMUSCULAR | Status: AC
  Filled 2018-01-23: qty 1

## 2018-01-23 MED ORDER — FENTANYL CITRATE (PF) 250 MCG/5ML IJ SOLN
INTRAMUSCULAR | Status: DC | PRN
  Administered 2018-01-23: 250 ug via INTRAVENOUS
  Administered 2018-01-23: 200 ug via INTRAVENOUS
  Administered 2018-01-23: 50 ug via INTRAVENOUS
  Administered 2018-01-23 (×2): 100 ug via INTRAVENOUS
  Administered 2018-01-23: 50 ug via INTRAVENOUS
  Administered 2018-01-23: 500 ug via INTRAVENOUS

## 2018-01-23 MED ORDER — METOCLOPRAMIDE HCL 5 MG/ML IJ SOLN
10.0000 mg | Freq: Four times a day (QID) | INTRAMUSCULAR | Status: AC
  Administered 2018-01-23 – 2018-01-28 (×20): 10 mg via INTRAVENOUS
  Filled 2018-01-23 (×19): qty 2

## 2018-01-23 MED ORDER — ACETAMINOPHEN 500 MG PO TABS
1000.0000 mg | ORAL_TABLET | Freq: Four times a day (QID) | ORAL | Status: AC
  Administered 2018-01-24 – 2018-01-28 (×15): 1000 mg via ORAL
  Filled 2018-01-23 (×16): qty 2

## 2018-01-23 MED ORDER — EPHEDRINE SULFATE 50 MG/ML IJ SOLN
INTRAMUSCULAR | Status: AC
  Filled 2018-01-23: qty 1

## 2018-01-23 MED ORDER — SODIUM CHLORIDE 0.9 % IV SOLN: 250.0000 mL | INTRAVENOUS | Status: DC

## 2018-01-23 MED ORDER — CHLORHEXIDINE GLUCONATE 0.12 % MT SOLN
OROMUCOSAL | Status: AC
  Administered 2018-01-23: 15 mL via OROMUCOSAL
  Filled 2018-01-23: qty 15

## 2018-01-23 MED ORDER — NITROGLYCERIN IN D5W 200-5 MCG/ML-% IV SOLN: 0.0000 ug/min | INTRAVENOUS | Status: DC

## 2018-01-23 MED ORDER — MORPHINE SULFATE (PF) 2 MG/ML IV SOLN
1.0000 mg | INTRAVENOUS | Status: DC | PRN
  Administered 2018-01-23: 2 mg via INTRAVENOUS
  Filled 2018-01-23: qty 1

## 2018-01-23 MED ORDER — ONDANSETRON HCL 4 MG/2ML IJ SOLN
4.0000 mg | Freq: Four times a day (QID) | INTRAMUSCULAR | Status: DC | PRN
  Administered 2018-01-24 – 2018-02-03 (×5): 4 mg via INTRAVENOUS
  Filled 2018-01-23 (×5): qty 2

## 2018-01-23 MED ORDER — ACETAMINOPHEN 160 MG/5ML PO SOLN
1000.0000 mg | Freq: Four times a day (QID) | ORAL | Status: AC
  Administered 2018-01-23 – 2018-01-27 (×2): 1000 mg
  Filled 2018-01-23 (×2): qty 40.6

## 2018-01-23 MED ORDER — AMIODARONE HCL IN DEXTROSE 360-4.14 MG/200ML-% IV SOLN
30.0000 mg/h | INTRAVENOUS | Status: DC
  Administered 2018-01-24: 30 mg/h via INTRAVENOUS
  Filled 2018-01-23: qty 200

## 2018-01-23 MED ORDER — LACTATED RINGERS IV SOLN: 500.0000 mL | Freq: Once | INTRAVENOUS | Status: DC | PRN

## 2018-01-23 MED ORDER — SODIUM CHLORIDE 0.9% FLUSH: 10.0000 mL | INTRAVENOUS | Status: DC | PRN

## 2018-01-23 MED ORDER — DEXMEDETOMIDINE HCL IN NACL 200 MCG/50ML IV SOLN
0.0000 ug/kg/h | INTRAVENOUS | Status: DC
  Administered 2018-01-23: 0.4 ug/kg/h via INTRAVENOUS
  Administered 2018-01-23: 0.7 ug/kg/h via INTRAVENOUS
  Filled 2018-01-23 (×4): qty 50

## 2018-01-23 MED ORDER — LACTATED RINGERS IV SOLN: INTRAVENOUS | Status: DC

## 2018-01-23 MED ORDER — TAMSULOSIN HCL 0.4 MG PO CAPS
0.4000 mg | ORAL_CAPSULE | Freq: Every day | ORAL | Status: DC
  Administered 2018-01-23 – 2018-02-02 (×11): 0.4 mg via ORAL
  Filled 2018-01-23 (×11): qty 1

## 2018-01-23 MED ORDER — SODIUM CHLORIDE 0.9 % IV SOLN
INTRAVENOUS | Status: DC | PRN
  Administered 2018-01-23 (×2): via INTRAVENOUS

## 2018-01-23 MED ORDER — METOPROLOL TARTRATE 12.5 MG HALF TABLET: 12.5000 mg | ORAL_TABLET | Freq: Once | ORAL | Status: DC

## 2018-01-23 MED ORDER — SIMVASTATIN 20 MG PO TABS
20.0000 mg | ORAL_TABLET | Freq: Every day | ORAL | Status: DC
  Administered 2018-01-23 – 2018-01-31 (×9): 20 mg via ORAL
  Filled 2018-01-23 (×9): qty 1

## 2018-01-23 MED ORDER — SODIUM CHLORIDE 0.9% FLUSH
3.0000 mL | Freq: Two times a day (BID) | INTRAVENOUS | Status: DC
  Administered 2018-01-24 – 2018-01-29 (×9): 3 mL via INTRAVENOUS

## 2018-01-23 MED ORDER — CHLORHEXIDINE GLUCONATE 4 % EX LIQD: 30.0000 mL | CUTANEOUS | Status: DC

## 2018-01-23 MED ORDER — AMIODARONE HCL IN DEXTROSE 360-4.14 MG/200ML-% IV SOLN: 30.0000 mg/h | INTRAVENOUS | Status: DC

## 2018-01-23 MED ORDER — METOPROLOL TARTRATE 5 MG/5ML IV SOLN: 2.5000 mg | INTRAVENOUS | Status: DC | PRN

## 2018-01-23 MED ORDER — FAMOTIDINE IN NACL 20-0.9 MG/50ML-% IV SOLN
20.0000 mg | Freq: Two times a day (BID) | INTRAVENOUS | Status: AC
  Administered 2018-01-23 (×2): 20 mg via INTRAVENOUS
  Filled 2018-01-23: qty 50

## 2018-01-23 MED ORDER — PROPOFOL 10 MG/ML IV BOLUS
INTRAVENOUS | Status: DC | PRN
  Administered 2018-01-23: 30 mg via INTRAVENOUS

## 2018-01-23 MED ORDER — SODIUM CHLORIDE 0.9 % IV SOLN
INTRAVENOUS | Status: DC
  Administered 2018-01-23: 15 [IU]/h via INTRAVENOUS
  Administered 2018-01-24: 10.4 [IU]/h via INTRAVENOUS
  Filled 2018-01-23 (×2): qty 1

## 2018-01-23 MED ORDER — SODIUM CHLORIDE 0.9 % IV SOLN
1.5000 g | Freq: Two times a day (BID) | INTRAVENOUS | Status: AC
  Administered 2018-01-23 – 2018-01-24 (×4): 1.5 g via INTRAVENOUS
  Filled 2018-01-23 (×4): qty 1.5

## 2018-01-23 MED ORDER — ASPIRIN EC 325 MG PO TBEC
325.0000 mg | DELAYED_RELEASE_TABLET | Freq: Every day | ORAL | Status: DC
  Administered 2018-01-24 – 2018-01-29 (×6): 325 mg via ORAL
  Filled 2018-01-23 (×7): qty 1

## 2018-01-23 MED ORDER — PHENYLEPHRINE 40 MCG/ML (10ML) SYRINGE FOR IV PUSH (FOR BLOOD PRESSURE SUPPORT)
PREFILLED_SYRINGE | INTRAVENOUS | Status: DC | PRN
  Administered 2018-01-23 (×2): 40 ug via INTRAVENOUS

## 2018-01-23 SURGICAL SUPPLY — 151 items
ADAPTER CARDIO PERF ANTE/RETRO (ADAPTER) ×5 IMPLANT
APPLICATOR COTTON TIP 6IN STRL (MISCELLANEOUS) IMPLANT
ARTICLIP LAA PROCLIP II 40 (Clip) ×4 IMPLANT
ARTICLIP LAA PROCLIP II 40MM (Clip) ×1 IMPLANT
ATTRACTOMAT 16X20 MAGNETIC DRP (DRAPES) IMPLANT
BAG DECANTER FOR FLEXI CONT (MISCELLANEOUS) ×5 IMPLANT
BANDAGE ACE 4X5 VEL STRL LF (GAUZE/BANDAGES/DRESSINGS) ×5 IMPLANT
BANDAGE ACE 6X5 VEL STRL LF (GAUZE/BANDAGES/DRESSINGS) ×5 IMPLANT
BASKET HEART  (ORDER IN 25'S) (MISCELLANEOUS) ×1
BASKET HEART (ORDER IN 25'S) (MISCELLANEOUS) ×1
BASKET HEART (ORDER IN 25S) (MISCELLANEOUS) ×3 IMPLANT
BLADE CLIPPER SURG (BLADE) ×5 IMPLANT
BLADE STERNUM SYSTEM 6 (BLADE) ×5 IMPLANT
BLADE SURG 11 STRL SS (BLADE) ×5 IMPLANT
BLADE SURG 12 STRL SS (BLADE) ×5 IMPLANT
BLADE SURG 15 STRL LF DISP TIS (BLADE) ×3 IMPLANT
BLADE SURG 15 STRL SS (BLADE) ×2
BNDG GAUZE ELAST 4 BULKY (GAUZE/BANDAGES/DRESSINGS) ×5 IMPLANT
CANISTER SUCT 3000ML PPV (MISCELLANEOUS) ×5 IMPLANT
CANN PRFSN 3/8XCNCT ST RT ANG (MISCELLANEOUS) ×3
CANN PRFSN 3/8XRT ANG TPR 14 (MISCELLANEOUS) ×3
CANNULA AORTIC ROOT 20012 (MISCELLANEOUS) ×4 IMPLANT
CANNULA AORTIC ROOT 9FR (CANNULA) ×5 IMPLANT
CANNULA GUNDRY RCSP 15FR (MISCELLANEOUS) ×5 IMPLANT
CANNULA PRFSN 3/8XCNCT RT ANG (MISCELLANEOUS) ×3 IMPLANT
CANNULA PRFSN 3/8XRT ANG TPR14 (MISCELLANEOUS) ×3 IMPLANT
CANNULA VEN MTL TIP RT (MISCELLANEOUS) ×4
CATH CPB KIT VANTRIGT (MISCELLANEOUS) ×5 IMPLANT
CATH RETROPLEGIA CORONARY 14FR (CATHETERS) ×5 IMPLANT
CATH ROBINSON RED A/P 18FR (CATHETERS) ×25 IMPLANT
CATH THORACIC 28FR RT ANG (CATHETERS) ×5 IMPLANT
CATH THORACIC 36FR (CATHETERS) ×5 IMPLANT
CATH THORACIC 36FR RT ANG (CATHETERS) ×5 IMPLANT
CLIP FOGARTY SPRING 6M (CLIP) ×10 IMPLANT
CLIP VESOCCLUDE SM WIDE 24/CT (CLIP) ×5 IMPLANT
CONN 1/2X1/2X1/2  BEN (MISCELLANEOUS) ×2
CONN 1/2X1/2X1/2 BEN (MISCELLANEOUS) ×3 IMPLANT
CONN 3/8X1/2 ST GISH (MISCELLANEOUS) ×5 IMPLANT
CONNECTOR 1/2X3/8X1/2 3 WAY (MISCELLANEOUS) ×2
CONNECTOR 1/2X3/8X1/2 3WAY (MISCELLANEOUS) ×3 IMPLANT
CONT SPEC 4OZ CLIKSEAL STRL BL (MISCELLANEOUS) ×5 IMPLANT
COVER SURGICAL LIGHT HANDLE (MISCELLANEOUS) IMPLANT
CRADLE DONUT ADULT HEAD (MISCELLANEOUS) ×5 IMPLANT
DERMABOND ADVANCED (GAUZE/BANDAGES/DRESSINGS) ×2
DERMABOND ADVANCED .7 DNX12 (GAUZE/BANDAGES/DRESSINGS) ×3 IMPLANT
DEVICE ATRICLIP LAA PRCLPII 40 (Clip) ×3 IMPLANT
DRAIN CHANNEL 32F RND 10.7 FF (WOUND CARE) ×5 IMPLANT
DRAPE CARDIOVASCULAR INCISE (DRAPES) ×2
DRAPE SLUSH/WARMER DISC (DRAPES) ×5 IMPLANT
DRAPE SRG 135X102X78XABS (DRAPES) ×3 IMPLANT
DRSG AQUACEL AG ADV 3.5X14 (GAUZE/BANDAGES/DRESSINGS) ×5 IMPLANT
ELECT BLADE 4.0 EZ CLEAN MEGAD (MISCELLANEOUS) ×5
ELECT BLADE 6.5 EXT (BLADE) ×5 IMPLANT
ELECT CAUTERY BLADE 6.4 (BLADE) ×5 IMPLANT
ELECT REM PT RETURN 9FT ADLT (ELECTROSURGICAL) ×10
ELECTRODE BLDE 4.0 EZ CLN MEGD (MISCELLANEOUS) ×3 IMPLANT
ELECTRODE REM PT RTRN 9FT ADLT (ELECTROSURGICAL) ×6 IMPLANT
FELT TEFLON 1X6 (MISCELLANEOUS) ×5 IMPLANT
FLOSEAL 5ML (HEMOSTASIS) ×10 IMPLANT
GAUZE SPONGE 4X4 12PLY STRL (GAUZE/BANDAGES/DRESSINGS) ×10 IMPLANT
GLOVE BIO SURGEON STRL SZ 6 (GLOVE) ×15 IMPLANT
GLOVE BIO SURGEON STRL SZ 6.5 (GLOVE) ×8 IMPLANT
GLOVE BIO SURGEON STRL SZ7 (GLOVE) ×15 IMPLANT
GLOVE BIO SURGEON STRL SZ7.5 (GLOVE) ×15 IMPLANT
GLOVE BIO SURGEON STRL SZ8.5 (GLOVE) ×5 IMPLANT
GLOVE BIO SURGEONS STRL SZ 6.5 (GLOVE) ×2
GLOVE BIOGEL PI IND STRL 6.5 (GLOVE) ×9 IMPLANT
GLOVE BIOGEL PI INDICATOR 6.5 (GLOVE) ×6
GLOVE EUDERMIC 7 POWDERFREE (GLOVE) ×10 IMPLANT
GOWN STRL REUS W/ TWL LRG LVL3 (GOWN DISPOSABLE) ×24 IMPLANT
GOWN STRL REUS W/ TWL XL LVL3 (GOWN DISPOSABLE) IMPLANT
GOWN STRL REUS W/TWL LRG LVL3 (GOWN DISPOSABLE) ×16
GOWN STRL REUS W/TWL XL LVL3 (GOWN DISPOSABLE)
HEMOSTAT POWDER SURGIFOAM 1G (HEMOSTASIS) IMPLANT
HEMOSTAT SURGICEL 2X14 (HEMOSTASIS) ×5 IMPLANT
INSERT FOGARTY XLG (MISCELLANEOUS) IMPLANT
KIT BASIN OR (CUSTOM PROCEDURE TRAY) ×5 IMPLANT
KIT CATH CPB BARTLE (MISCELLANEOUS) ×5 IMPLANT
KIT SUCTION CATH 14FR (SUCTIONS) ×10 IMPLANT
KIT TURNOVER KIT B (KITS) ×5 IMPLANT
KIT VASOVIEW HEMOPRO VH 3000 (KITS) ×5 IMPLANT
LEAD PACING MYOCARDI (MISCELLANEOUS) ×5 IMPLANT
LINE VENT (MISCELLANEOUS) ×5 IMPLANT
LOOP VESSEL SUPERMAXI WHITE (MISCELLANEOUS) ×5 IMPLANT
MARKER GRAFT CORONARY BYPASS (MISCELLANEOUS) ×15 IMPLANT
NS IRRIG 1000ML POUR BTL (IV SOLUTION) ×40 IMPLANT
PACK E OPEN HEART (SUTURE) ×5 IMPLANT
PACK OPEN HEART (CUSTOM PROCEDURE TRAY) ×5 IMPLANT
PAD ARMBOARD 7.5X6 YLW CONV (MISCELLANEOUS) ×10 IMPLANT
PAD ELECT DEFIB RADIOL ZOLL (MISCELLANEOUS) ×5 IMPLANT
PENCIL BUTTON HOLSTER BLD 10FT (ELECTRODE) ×5 IMPLANT
POWDER SURGICEL 3.0 GRAM (HEMOSTASIS) ×10 IMPLANT
PUNCH AORTIC ROTATE 4.0MM (MISCELLANEOUS) IMPLANT
PUNCH AORTIC ROTATE 4.5MM 8IN (MISCELLANEOUS) IMPLANT
PUNCH AORTIC ROTATE 5MM 8IN (MISCELLANEOUS) IMPLANT
SET CARDIOPLEGIA MPS 5001102 (MISCELLANEOUS) ×5 IMPLANT
SOLUTION ANTI FOG 6CC (MISCELLANEOUS) ×5 IMPLANT
SPONGE LAP 18X18 X RAY DECT (DISPOSABLE) ×5 IMPLANT
SPONGE LAP 4X18 RFD (DISPOSABLE) ×5 IMPLANT
SUCKER WEIGHTED FLEX (MISCELLANEOUS) ×5 IMPLANT
SURGIFLO W/THROMBIN 8M KIT (HEMOSTASIS) IMPLANT
SUT BONE WAX W31G (SUTURE) ×5 IMPLANT
SUT ETHIBOND 2 0 SH (SUTURE) ×12 IMPLANT
SUT ETHIBOND 2 0 SH 36X2 (SUTURE) ×3 IMPLANT
SUT ETHIBOND 2 0 V4 (SUTURE) IMPLANT
SUT ETHIBOND 2 0V4 GREEN (SUTURE) IMPLANT
SUT ETHIBOND 4 0 TF (SUTURE) IMPLANT
SUT ETHIBOND 5 0 C 1 30 (SUTURE) IMPLANT
SUT MNCRL AB 4-0 PS2 18 (SUTURE) ×5 IMPLANT
SUT PROLENE 3 0 SH 1 (SUTURE) IMPLANT
SUT PROLENE 3 0 SH DA (SUTURE) ×15 IMPLANT
SUT PROLENE 3 0 SH1 36 (SUTURE) ×10 IMPLANT
SUT PROLENE 4 0 RB 1 (SUTURE) ×20
SUT PROLENE 4 0 SH DA (SUTURE) ×55 IMPLANT
SUT PROLENE 4-0 RB1 .5 CRCL 36 (SUTURE) ×30 IMPLANT
SUT PROLENE 5 0 C 1 36 (SUTURE) ×5 IMPLANT
SUT PROLENE 6 0 C 1 30 (SUTURE) ×25 IMPLANT
SUT PROLENE 6 0 CC (SUTURE) ×35 IMPLANT
SUT PROLENE 8 0 BV175 6 (SUTURE) ×20 IMPLANT
SUT PROLENE BLUE 7 0 (SUTURE) ×15 IMPLANT
SUT SILK  1 MH (SUTURE) ×4
SUT SILK 1 MH (SUTURE) ×6 IMPLANT
SUT SILK 1 TIES 10X30 (SUTURE) IMPLANT
SUT SILK 2 0 (SUTURE)
SUT SILK 2 0 SH CR/8 (SUTURE) ×5 IMPLANT
SUT SILK 2-0 18XBRD TIE 12 (SUTURE) IMPLANT
SUT SILK 3 0 SH CR/8 (SUTURE) ×10 IMPLANT
SUT SILK 4 0 (SUTURE)
SUT SILK 4-0 18XBRD TIE 12 (SUTURE) IMPLANT
SUT STEEL 6MS V (SUTURE) ×5 IMPLANT
SUT STEEL SZ 6 DBL 3X14 BALL (SUTURE) ×5 IMPLANT
SUT TEM PAC WIRE 2 0 SH (SUTURE) ×5 IMPLANT
SUT VIC AB 1 CTX 18 (SUTURE) IMPLANT
SUT VIC AB 1 CTX 36 (SUTURE) ×10
SUT VIC AB 1 CTX36XBRD ANBCTR (SUTURE) ×15 IMPLANT
SUT VIC AB 2-0 CT1 27 (SUTURE) ×2
SUT VIC AB 2-0 CT1 TAPERPNT 27 (SUTURE) ×3 IMPLANT
SUT VIC AB 2-0 CTX 27 (SUTURE) ×5 IMPLANT
SUT VIC AB 2-0 CTX 36 (SUTURE) ×5 IMPLANT
SUT VIC AB 3-0 X1 27 (SUTURE) ×5 IMPLANT
SYR BULB IRRIGATION 50ML (SYRINGE) ×5 IMPLANT
SYSTEM SAHARA CHEST DRAIN ATS (WOUND CARE) ×10 IMPLANT
TAPE CLOTH SURG 4X10 WHT LF (GAUZE/BANDAGES/DRESSINGS) ×10 IMPLANT
TAPE PAPER 2X10 WHT MICROPORE (GAUZE/BANDAGES/DRESSINGS) ×5 IMPLANT
TOWEL GREEN STERILE (TOWEL DISPOSABLE) ×5 IMPLANT
TOWEL GREEN STERILE FF (TOWEL DISPOSABLE) ×10 IMPLANT
TRAY FOLEY SLVR 16FR TEMP STAT (SET/KITS/TRAYS/PACK) ×5 IMPLANT
TUBING INSUFFLATION (TUBING) ×5 IMPLANT
UNDERPAD 30X30 (UNDERPADS AND DIAPERS) ×5 IMPLANT
VALVE MAGNA MITRAL 29MM (Prosthesis & Implant Heart) ×5 IMPLANT
WATER STERILE IRR 1000ML POUR (IV SOLUTION) ×10 IMPLANT

## 2018-01-23 NOTE — Progress Notes (Signed)
  Echocardiogram 2D Echocardiogram has been performed.  Leta JunglingCooper, Pryce Folts M 01/23/2018, 8:56 AM

## 2018-01-23 NOTE — Anesthesia Procedure Notes (Signed)
Arterial Line Insertion Start/End7/29/2019 6:38 AM, 01/23/2018 6:44 AM Performed by: Rachel MouldsLee, Ayub Kirsh B, CRNA, CRNA  Patient location: Pre-op. Preanesthetic checklist: patient identified, IV checked, site marked, risks and benefits discussed, surgical consent, monitors and equipment checked, pre-op evaluation and timeout performed Lidocaine 1% used for infiltration and patient sedated Left, radial was placed Catheter size: 20 G Hand hygiene performed  and maximum sterile barriers used  Allen's test indicative of satisfactory collateral circulation Attempts: 1 Procedure performed without using ultrasound guided technique. Following insertion, Biopatch and dressing applied. Post procedure assessment: normal  Patient tolerated the procedure well with no immediate complications.

## 2018-01-23 NOTE — Anesthesia Procedure Notes (Signed)
Central Venous Catheter Insertion Performed by: Roderic Palau, MD, anesthesiologist Start/End7/29/2019 6:35 AM, 01/23/2018 6:50 AM Patient location: Pre-op. Preanesthetic checklist: patient identified, IV checked, site marked, risks and benefits discussed, surgical consent, monitors and equipment checked, pre-op evaluation, timeout performed and anesthesia consent Position: Trendelenburg Lidocaine 1% used for infiltration and patient sedated Hand hygiene performed , maximum sterile barriers used  and Seldinger technique used Catheter size: 9 Fr Total catheter length 10. Central line was placed.MAC introducer Procedure performed using ultrasound guided technique. Ultrasound Notes:anatomy identified, needle tip was noted to be adjacent to the nerve/plexus identified, no ultrasound evidence of intravascular and/or intraneural injection and image(s) printed for medical record Attempts: 1 Following insertion, line sutured, dressing applied and Biopatch. Post procedure assessment: blood return through all ports, free fluid flow and no air  Patient tolerated the procedure well with no immediate complications.

## 2018-01-23 NOTE — OR Nursing (Signed)
First call to SICU charge nurse at [phone].

## 2018-01-23 NOTE — Progress Notes (Signed)
Pre Procedure note for inpatients:   Nathan D [name] has been scheduled for Procedure(s): MITRAL VALVE (MV) REPLACEMENT (N/A) CORONARY ARTERY BYPASS GRAFTING (CABG) (N/A) TRANSESOPHAGEAL ECHOCARDIOGRAM (TEE) (N/A) today. The various methods of treatment have been discussed with the patient. After consideration of the risks, benefits and treatment options the patient has consented to the planned procedure.   The patient has been seen and labs reviewed. There are no changes in the patient's condition to prevent proceeding with the planned procedure today.  Recent labs:  Lab Results  Component Value Date   WBC 4.7 01/19/2018   HGB 13.5 01/19/2018   HCT 42.4 01/19/2018   PLT 184 01/19/2018   GLUCOSE 145 (H) 01/19/2018   CHOL 136 07/28/2017   TRIG 101 07/28/2017   HDL 34 (L) 07/28/2017   LDLCALC 82 07/28/2017   ALT 58 (H) 01/19/2018   AST 40 01/19/2018   NA 139 01/19/2018   K 3.9 01/19/2018   CL 108 01/19/2018   CREATININE 0.88 01/19/2018   BUN 15 01/19/2018   CO2 24 01/19/2018   TSH 2.140 07/28/2017   INR 1.11 01/19/2018   HGBA1C 5.9 (H) 01/19/2018    Nathan BussingPeter Van Trigt III, MD 01/23/2018 7:08 AM

## 2018-01-23 NOTE — Anesthesia Postprocedure Evaluation (Signed)
Anesthesia Post Note  Patient: Nathan PhilipsRandleman D Tomlinson Jr.  Procedure(s) Performed: MITRAL VALVE (MV) REPLACEMENT USING MAGNA MITRAL EASE PERICARDIAL BIOPROSTHESIS, MODEL 7300TFX, SIZE 29 MM, SERIAL # [id number], EXPIRATION  DATE 2021-03-03. (N/A ) CORONARY ARTERY BYPASS GRAFTING (CABG) x 4 WITH ENDOSCOPIC HARVESTING OF RIGHT GREATER SAPHENOUS VEIN: LIMA TO LAD, SVG TO RCA, SVG TO DIAG, SVG TO OM  (N/A Chest) TRANSESOPHAGEAL ECHOCARDIOGRAM (TEE) (N/A ) LEFT ATRIAL APPENDAGE OCCLUSION USING ATRICURE ATRICLIP PRO2 LAA EXCLUSION SYSTEM  SIZE 40, LOT # [id number], CAT # [id number], EXP. DATE 2020-04-28 (Left )     Patient location during evaluation: SICU Anesthesia Type: General Level of consciousness: sedated Pain management: pain level controlled Vital Signs Assessment: post-procedure vital signs reviewed and stable Respiratory status: patient remains intubated per anesthesia plan Cardiovascular status: stable Postop Assessment: no apparent nausea or vomiting Anesthetic complications: no    Last Vitals:  Vitals:   01/23/18 0542  BP: (!) 156/89  Pulse: 67  Resp: 20  Temp: 36.6 C  SpO2: 100%    Last Pain:  Vitals:   01/23/18 0600  PainSc: 0-No pain                 Lacoya Wilbanks,W. EDMOND

## 2018-01-23 NOTE — Transfer of Care (Signed)
Immediate Anesthesia Transfer of Care Note  Patient: Nathan PhilipsRandleman D Bramhall Jr.  Procedure(s) Performed: MITRAL VALVE (MV) REPLACEMENT USING MAGNA MITRAL EASE PERICARDIAL BIOPROSTHESIS, MODEL 7300TFX, SIZE 29 MM, SERIAL # [id number], EXPIRATION  DATE 2021-03-03. (N/A ) CORONARY ARTERY BYPASS GRAFTING (CABG) x 4 WITH ENDOSCOPIC HARVESTING OF RIGHT GREATER SAPHENOUS VEIN: LIMA TO LAD, SVG TO RCA, SVG TO DIAG, SVG TO OM  (N/A Chest) TRANSESOPHAGEAL ECHOCARDIOGRAM (TEE) (N/A ) LEFT ATRIAL APPENDAGE OCCLUSION USING ATRICURE ATRICLIP PRO2 LAA EXCLUSION SYSTEM  SIZE 40, LOT # [id number], CAT # [id number], EXP. DATE 2020-04-28 (Left )  Patient Location: SICU  Anesthesia Type:General  Level of Consciousness: Patient remains intubated per anesthesia plan  Airway & Oxygen Therapy: Patient remains intubated per anesthesia plan and Patient placed on Ventilator (see vital sign flow sheet for setting)  Post-op Assessment: Report given to RN and Post -op Vital signs reviewed and stable  Post vital signs: Reviewed and stable  Last Vitals:  Vitals Value Taken Time  BP    Temp 35.5 C 01/23/2018  4:10 PM  Pulse 90 01/23/2018  4:10 PM  Resp 17 01/23/2018  4:10 PM  SpO2 100 % 01/23/2018  4:10 PM  Vitals shown include unvalidated device data. Hr 90 pacing, BP 110/56, 12, sats  100% on vent Last Pain:  Vitals:   01/23/18 0600  PainSc: 0-No pain      Patients Stated Pain Goal: 3 (01/23/18 0600)  Complications: No apparent anesthesia complications

## 2018-01-23 NOTE — Progress Notes (Signed)
Pharmacy Antibiotic Note  Nathan D [name] is a 72 y.o. male s/p CABG and MVR.  Pharmacy has been consulted for vancomycin dosing for surgical prophylaxis. Pt received 1500mg  IV vancomycin in the OR ~0800.  Plan: Vancomycin 1000mg  IV q12h - goal trough 10-15 mcg/ml Monitor length of therapy and renal function post/op  Height: 5' 11.5" (181.6 cm) Weight: 204 lb 6.4 oz (92.7 kg) IBW/kg (Calculated) : 76.45  Temp (24hrs), Avg:97.9 F (36.6 C), Min:97.9 F (36.6 C), Max:97.9 F (36.6 C)  Recent Labs  Lab 01/19/18 1440  01/23/18 1113 01/23/18 1142 01/23/18 1213 01/23/18 1312 01/23/18 1424  WBC 4.7  --   --   --   --   --   --   CREATININE 0.88   < > 0.70 0.70 0.50* 0.60* 0.60*   < > = values in this interval not displayed.    Estimated Creatinine Clearance: 102.3 mL/min (A) (by C-G formula based on SCr of 0.6 mg/dL (L)).    Allergies  Allergen Reactions  . Fentanyl Shortness Of Breath  . Promethazine Hcl Anaphylaxis and Other (See Comments)    Cardiac arrest  . Foeniculum Vulgare Other (See Comments)    Fennel bulbs--nausea only  . Metformin And Related Diarrhea  . Penicillins Nausea Only    Has patient had a PCN reaction causing immediate rash, facial/tongue/throat swelling, SOB or lightheadedness with hypotension: no Has patient had a PCN reaction causing severe rash involving mucus membranes or skin necrosis: no Has patient had a PCN reaction that required hospitalization: no Has patient had a PCN reaction occurring within the last 10 years: yes If all of the above answers are "NO", then may proceed with Cephalosporin use.   . Sulfa Antibiotics Other (See Comments)    G.I. Upset    Antimicrobials this admission: 7/29 Vancomycin >>  7/29 Zinacef >>   Dose adjustments this admission: none  Microbiology results: none  Thank you for allowing pharmacy to be a part of this patient's care.  Fredonia HighlandMichael Bitonti, PharmD, BCPS Clinical Pharmacist [phone] Please  check AMION for all [facility] Pharmacy numbers 01/23/2018

## 2018-01-23 NOTE — Anesthesia Procedure Notes (Addendum)
Procedure Name: Intubation Date/Time: 01/23/2018 7:45 AM Performed by: Wilburn Cornelia, CRNA Pre-anesthesia Checklist: Patient identified, Emergency Drugs available, Suction available and Patient being monitored Patient Re-evaluated:Patient Re-evaluated prior to induction Oxygen Delivery Method: Circle System Utilized Preoxygenation: Pre-oxygenation with 100% oxygen Induction Type: IV induction Ventilation: Mask ventilation without difficulty Laryngoscope Size: Mac and 4 Grade View: Grade II Tube type: Oral Tube size: 8.0 mm Number of attempts: 1 Airway Equipment and Method: Stylet and Oral airway Placement Confirmation: ETT inserted through vocal cords under direct vision,  positive ETCO2 and breath sounds checked- equal and bilateral Secured at: 22 cm Tube secured with: Tape Dental Injury: Teeth and Oropharynx as per pre-operative assessment

## 2018-01-23 NOTE — Anesthesia Procedure Notes (Signed)
Central Venous Catheter Insertion Performed by: Gaynelle AduFitzgerald, Milton Sagona, MD, anesthesiologist Start/End7/29/2019 6:35 AM, 01/23/2018 6:50 AM Patient location: Pre-op. Preanesthetic checklist: patient identified, IV checked, site marked, risks and benefits discussed, surgical consent, monitors and equipment checked, pre-op evaluation, timeout performed and anesthesia consent Hand hygiene performed  and maximum sterile barriers used  PA cath was placed.Swan type:thermodilution PA Cath depth:50 Procedure performed without using ultrasound guided technique. Attempts: 1 Patient tolerated the procedure well with no immediate complications.

## 2018-01-23 NOTE — Progress Notes (Signed)
Paged Tyrone SageGerhardt, MD with patient's hemoglobin 7.5. Made aware of chest tube output around 60-70 mL/hr and that epi gtt @ 5 and levo gtt @ 7. Orders to transfuse 1 unit pRBCs and not to wean patient until after the blood is given.

## 2018-01-23 NOTE — Progress Notes (Signed)
EVENING ROUNDS NOTE :     [address]       [place] [id number]             [phone]                 Day of Surgery Procedure(s) (LRB): MITRAL VALVE (MV) REPLACEMENT USING MAGNA MITRAL EASE PERICARDIAL BIOPROSTHESIS, MODEL 7300TFX, SIZE 29 MM, SERIAL # [id number], EXPIRATION  DATE 2021-03-03. (N/A) CORONARY ARTERY BYPASS GRAFTING (CABG) x 4 WITH ENDOSCOPIC HARVESTING OF RIGHT GREATER SAPHENOUS VEIN: LIMA TO LAD, SVG TO RCA, SVG TO DIAG, SVG TO OM  (N/A) TRANSESOPHAGEAL ECHOCARDIOGRAM (TEE) (N/A) LEFT ATRIAL APPENDAGE OCCLUSION USING ATRICURE ATRICLIP PRO2 LAA EXCLUSION SYSTEM  SIZE 40, LOT # [id number], CAT # [id number], EXP. DATE 2020-04-28 (Left)  Total Length of Stay:  LOS: 0 days  BP 96/70   Pulse 90   Temp (!) 97 F (36.1 C)   Resp 12   Ht 5' 11.5" (1.816 m)   Wt 92.7 kg (204 lb 6.4 oz)   SpO2 100%   BMI 28.11 kg/m   .Intake/Output      07/28 0701 - 07/29 0700 07/29 0701 - 07/30 0700   I.V. (mL/kg)  2660.2 (28.7)   Blood  1225   IV Piggyback  1378.5   Total Intake(mL/kg)  5263.7 (56.8)   Urine (mL/kg/hr)  1625 (1.5)   Blood  400   Chest Tube  290   Total Output  2315   Net  +2948.7          . sodium chloride Stopped (01/23/18 1659)  . [START ON 01/24/2018] sodium chloride    . sodium chloride 20 mL/hr at 01/23/18 1643  . albumin human 250 mL (01/23/18 1748)  . amiodarone    . [START ON 01/24/2018] amiodarone    . cefUROXime (ZINACEF)  IV    . dexmedetomidine (PRECEDEX) IV infusion 0.7 mcg/kg/hr (01/23/18 1800)  . EPINEPHrine 4 mg in dextrose 5% 250 mL infusion (16 mcg/mL) 5 mcg/min (01/23/18 1800)  . famotidine (PEPCID) IV Stopped (01/23/18 1646)  . insulin (NOVOLIN-R) infusion 8.4 mL/hr at 01/23/18 1800  . lactated ringers    . lactated ringers 20 mL/hr at 01/23/18 1800  . lactated ringers 20 mL/hr at 01/23/18 1800  . magnesium sulfate 20 mL/hr at 01/23/18 1800  . milrinone 0.25 mcg/kg/min (01/23/18 1800)  . nitroGLYCERIN Stopped (01/23/18 1600)    . norepinephrine (LEVOPHED) Adult infusion 10 mcg/min (01/23/18 1800)  . phenylephrine (NEO-SYNEPHRINE) Adult infusion Stopped (01/23/18 1600)  . potassium chloride 10 mEq (01/23/18 1801)  . potassium chloride    . vancomycin       Lab Results  Component Value Date   WBC 13.3 (H) 01/23/2018   HGB 8.3 (L) 01/23/2018   HCT 24.7 (L) 01/23/2018   PLT 124 (L) 01/23/2018   GLUCOSE 181 (H) 01/23/2018   CHOL 136 07/28/2017   TRIG 101 07/28/2017   HDL 34 (L) 07/28/2017   LDLCALC 82 07/28/2017   ALT 58 (H) 01/19/2018   AST 40 01/19/2018   NA 143 01/23/2018   K 3.7 01/23/2018   CL 104 01/23/2018   CREATININE 0.60 (L) 01/23/2018   BUN 11 01/23/2018   CO2 24 01/19/2018   TSH 2.140 07/28/2017   INR 1.16 01/23/2018   HGBA1C 5.9 (H) 01/19/2018   Early postop  200 ml from chest tubes for 3 hours    Delight OvensEdward B Arayna Illescas MD  Beeper [phone] Office [phone] 01/23/2018 6:53 PM

## 2018-01-23 NOTE — H&P (Signed)
Kerin Perna, MD (Physician)   PCP is [name], MD Referring Provider is End, [name], MD       Chief Complaint  Patient presents with  . Follow-up      to finalize surgery prep for CABG/MVR.....has received dental clearance  The patient returns for  combined CABG and mitral valve replacement for ischemic cardiomyopathy, diabetes, severe three-vessel CAD, ejection fraction 30%   HPI: Patient returns for surgical therapy of his CAD and moderate to severe mitral regurgitation, severe LV dysfunction.  He has now recovered from his dental extractions of necrotic teeth.  His weight is recent by 5 pounds and he feels stronger.  CT scan of the chest shows no evidence of pneumonia edema or at risk pulmonary nodules-adenopathy.  Carotid scan shows no significant stenosis.  Patient is ready to have surgery scheduled at [facility] July 29- CABG x4 with mitral valve replacement with a bioprosthetic valve.  Patient has been off Eliquis since his dental surgery.  His mouth is healed.     june 2019   HPI: 72 year old diabetic ex-smoker returns for further discussion of his ischemic cardiomyopathy[EF 30%[three-vessel CAD, history of A. fib and embolic stroke.  Since last visit the patient has had several necrotic teeth removed by Dr. Kristin Bruins.  He still has sutures in place.  He has lost 10 pounds.  His activity level has dropped.  He does not appear to be ready for surgery soon.  He is scheduled to have the sutures removed later this week and will be advanced in his diet.  I recommended the patient become more active and reverse the weight loss trend and improve nutritional status before surgery.  We will proceed with setting a date for surgery on July 29 and obtain a preoperative CT scan of the chest and pre-CABG Dopplers as an outpatient.  He will remain off his Eliquis-he is in sinus rhythm.  He will continue his current meds including inserted aspirin amiodarone carvedilol glipizide  lisinopril and Zocor.         72 year old diabetic presents for recently diagnosed severe three-vessel CAD with ischemic cardiomyopathy EF 35%, moderate-severe ischemic mitral regurgitation and atrial fibrillation which caused an embolic right cerebral CVA treated with IR clot extraction and reversal of left-sided weakness November 2018.  Patient currently on Eliquis in rate controlled atrial fibrillation.  Following recovery from his stroke he underwent left and right heart catheterization by Dr.END which demonstrated severe three-vessel coronary disease with a diabetic pattern with chronic occlusion the RCA and 70% left main stenosis.  PA pressures and cardiac output were normal.  Patient currently is asymptomatic of chest pain or shortness of breath and is recovering well from the left-sided weakness of the stroke but still has short-term memory issues.  Patient is unsure of how long he has had atrial fibrillation.  He believes he had a heart murmur when he was evaluated for arm services             Past Medical History:  Diagnosis Date  . Atrial fibrillation with RVR (HCC) 05/01/2017  . Cardiomyopathy (HCC) 05/01/2017  . CHF (congestive heart failure) (HCC)    . 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 Coronary artery disease    . DM (diabetes mellitus) type 2, uncontrolled, with ketoacidosis (HCC) 05/01/2017  . Essential hypertension 11/26/2016  . History of kidney stones    . HLD (hyperlipidemia) 05/01/2017  . IVCD (intraventricular conduction defect) 04/24/2015  . Mitral  valve regurgitation    . Mixed dyslipidemia 11/26/2016  . Stroke (cerebrum) (HCC) 04/29/2017           Past Surgical History:  Procedure Laterality Date  . BRAIN SURGERY      . CARDIAC CATHETERIZATION      . IR PERCUTANEOUS ART THROMBECTOMY/INFUSION INTRACRANIAL INC DIAG ANGIO   04/29/2017  . IR RADIOLOGIST EVAL & MGMT   05/17/2017  . IR US GUIDE VASC ACCESS LEFT   04/29/2017  . IR US  GUIDE VASC ACCESS RIGHT   04/29/2017  . MULTIPLE EXTRACTIONS WITH ALVEOLOPLASTY N/A 12/26/2017    Procedure: Extraction of tooth #'s 4,6-11, 18 -27, 30, and 31 with alveoloplasty;  Surgeon: Charlynne PanderKulinski, Ronald F, DDS;  Location: [facility] OR;  Service: Oral Surgery;  Laterality: N/A;  . RADIOLOGY WITH ANESTHESIA N/A 04/29/2017    Procedure: IR WITH ANESTHESIA;  Surgeon: Radiologist, Medication, MD;  Location: [facility] OR;  Service: Radiology;  Laterality: N/A;  . RIGHT/LEFT HEART CATH AND CORONARY ANGIOGRAPHY N/A 08/18/2017    Procedure: RIGHT/LEFT HEART CATH AND CORONARY ANGIOGRAPHY;  Surgeon: Yvonne KendallEnd, Christopher, MD;  Location: [facility] INVASIVE CV LAB;  Service: Cardiovascular;  Laterality: N/A;  . TEE WITHOUT CARDIOVERSION N/A 09/22/2017    Procedure: TRANSESOPHAGEAL ECHOCARDIOGRAM (TEE);  Surgeon: Lewayne Buntingrenshaw, Brian S, MD;  [facility];  Service: Cardiovascular;  Laterality: N/A;  . TONSILLECTOMY               Family History  Problem Relation Age of Onset  . Hypertension Father    . Diabetes Father        Social History Social History         Tobacco Use  . Smoking status: Former Smoker      Last attempt to quit: 1994      Years since quitting: 25.5  . Smokeless tobacco: Never Used  Substance Use Topics  . Alcohol use: Yes      Alcohol/week: 1.8 oz      Types: 1 Glasses of wine, 1 Cans of beer, 1 Standard drinks or equivalent per week      Comment: mix drink  . Drug use: No            Current Outpatient Medications  Medication Sig Dispense Refill  . acetaminophen (TYLENOL) 325 MG tablet Take 650 mg by mouth every 6 (six) hours as needed (for pain.).       [name]. amiodarone (PACERONE) 200 MG tablet Take 1 tablet (200 mg total) by mouth daily. 90 tablet 3  . aspirin EC 81 MG tablet Take 81 mg by mouth daily.      . carvedilol (COREG) 6.25 MG tablet Take 1 tablet (6.25 mg total) by mouth 2 (two) times daily. 60 tablet 3  . cyclobenzaprine (FLEXERIL) 10 MG tablet Take 10 mg by mouth 2 (two) times  daily as needed.   0  . glipiZIDE (GLUCOTROL XL) 10 MG 24 hr tablet Take 10 mg by mouth daily with breakfast.    3  . lisinopril (PRINIVIL,ZESTRIL) 5 MG tablet Take 2.5 mg by mouth daily.      . repaglinide (PRANDIN) 0.5 MG tablet Take 1 tablet by mouth 3 (three) times daily with meals.    3  . simvastatin (ZOCOR) 40 MG tablet Take 0.5 tablets (20 mg total) by mouth at bedtime. 45 tablet 1  . tamsulosin (FLOMAX) 0.4 MG CAPS capsule Take 0.4 mg by mouth at bedtime.    3    No current facility-administered medications  for this visit.            Allergies  Allergen Reactions  . Fentanyl Shortness Of Breath  . Promethazine Hcl Other (See Comments)      Cardiac arrest  . Metformin And Related Diarrhea  . Penicillins Nausea Only      Has patient had a PCN reaction causing immediate rash, facial/tongue/throat swelling, SOB or lightheadedness with hypotension: no Has patient had a PCN reaction causing severe rash involving mucus membranes or skin necrosis: no Has patient had a PCN reaction that required hospitalization: no Has patient had a PCN reaction occurring within the last 10 years: yes If all of the above answers are "NO", then may proceed with Cephalosporin use.    . Sulfa Antibiotics Other (See Comments)      G.I. Upset  . Foeniculum Vulgare Other (See Comments)      Fennel bulbs--nausea only      Review of Systems       Walking daily, gaining weight proximal 5 pounds, feeling stronger, able to eat regular food              Review of Systems :  [ y ] = yes, [  ] = no         General :  Weight gain [ y  ]    Weight loss  [   ]  Fatigue [  y]  Fever [  ]  Chills  [  ]                                            HEENT    Headache [  ]  Dizziness [  ]  Blurred vision [  ] Glaucoma  [  ]                          Nosebleeds [  ] Painful or loose teeth [  ]         Cardiac :  Chest pain/ pressure [  ]  Resting SOB [  ] exertional SOB [ y ]                        Orthopnea [  ]   Pedal edema  [  ]  Palpitations [  ] Syncope/presyncope [ ]                         Paroxysmal nocturnal dyspnea [  ]          Pulmonary : cough [  ]  wheezing [  ]  Hemoptysis [  ] Sputum [  ] Snoring [  ]                              Pneumothorax [  ]  Sleep apnea [  ]         GI : Vomiting [  ]  Dysphagia [  ]  Melena  [  ]  Abdominal pain [  ] BRBPR [  ]              Heart burn [  ]  Constipation [  ] Diarrhea  [  ] Colonoscopy [   ]  GU : Hematuria [  ]  Dysuria [  ]  Nocturia [  ] UTI's [  ]         Vascular : Claudication [  ]  Rest pain [  ]  DVT [  ] Vein stripping [  ] leg ulcers [  ]                          TIA [  ] Stroke [  ]  Varicose veins [  ]         NEURO :  Headaches  [  ] Seizures [  ] Vision changes [  ] Paresthesias [  ]                                                Musculoskeletal :  Arthritis [  ] Gout  [  ]  Back pain [  ]  Joint pain [  ]         Skin :  Rash [  ]  Melanoma [  ] Sores [  ]         Heme : Bleeding problems [  ]Clotting Disorders [  ] Anemia [  ]Blood Transfusion [ ]          Endocrine : Diabetes [  ] Heat or Cold intolerance [  ] Polyuria [  ]excessive thirst [ ]          Psych : Depression [  ]  Anxiety [  ]  Psych hospitalizations [  ] Memory change [  ]                                                                               BP 130/64 (BP Location: Right Arm, Patient Position: Sitting, Cuff Size: Large)   Pulse (!) 57   Resp 16   Ht 5\' 11"  (1.803 m)   Wt 206 lb 9.6 oz (93.7 kg)   SpO2 99% Comment: ON RA  BMI 28.81 kg/m  Physical Exam        Exam     General- alert and comfortable    Neck- no JVD, no cervical adenopathy palpable, no carotid bruit   Lungs- clear without rales, wheezes   Cor-irregular rate and rhythm, 3/6 MR  murmur , no  gallop   Abdomen- soft, non-tender   Extremities - warm, non-tender, minimal edema   Neuro- oriented, appropriate, no focal weakness     Diagnostic Tests: CT scan images  and report of carotid Dopplers personally reviewed with patient   Impression: Severe three-vessel coronary disease, ischemic cardiomyopathy EF 30%, moderate to severe mitral regurgitation in a 72 year old diabetic.  PFTs are pending. Plan: CABG with bypass grafts to RCA, LAD, diagonal, OM, and mitral valve replacement and probable atrial clip.  Scheduled for July 29 at [facility].  Procedure indications benefits and risks discussed in detail with patient and wife.   Mikey Bussing, MD [facility] [phone]  [facility] Provider CC Chart Rep sent to [name], MD and [name], MD

## 2018-01-23 NOTE — Brief Op Note (Signed)
01/23/2018  8:10 AM  PATIENT:  Nathan Philipsandleman D Sloane Jr.  72 y.o. male  PRE-OPERATIVE DIAGNOSIS:  CAD MR  POST-OPERATIVE DIAGNOSIS:  CAD MR  PROCEDURE:  Procedure(s):  CORONARY ARTERY BYPASS GRAFTING x 4 -LIMA to LAD -SVG to DIAGONAL -SVG to OM -SVG to PDA  MITRAL VALVE REPLACEMENT -29 mm [facility] Magna Ease Bioprosthetic Valve Model #7300 TFX Serial # [id number]  CLIPPING OF LEFT ATRIAL APPENDAGE -40 mm Atricure Proclip 2  ENDOSCOPIC HARVEST GREATER SAPHENOUS VEIN -Right Leg  TRANSESOPHAGEAL ECHOCARDIOGRAM (TEE) (N/A)  SURGEON:  Surgeon(s) and Role:    Kerin Perna* Van Trigt, Peter, MD - Primary  PHYSICIAN ASSISTANT: Lowella DandyErin Jerrin Recore PA-C  ANESTHESIA:   general  EBL:  400 mL   BLOOD ADMINISTERED: CELLSAVER  DRAINS: Left, Right Pleural Chest Tube, Mediastinal Chest Drain   LOCAL MEDICATIONS USED:  NONE  SPECIMEN:  Source of Specimen:  Mitral Valve Leaflets  DISPOSITION OF SPECIMEN:  PATHOLOGY  COUNTS:  YES  TOURNIQUET:  * No tourniquets in log *  DICTATION: .Dragon Dictation  PLAN OF CARE: Admit to inpatient   PATIENT DISPOSITION:  ICU - intubated and hemodynamically stable.   Delay start of Pharmacological VTE agent (>24hrs) due to surgical blood loss or risk of bleeding: yes

## 2018-01-24 ENCOUNTER — Encounter (HOSPITAL_COMMUNITY): Payer: Self-pay | Admitting: Cardiothoracic Surgery

## 2018-01-24 ENCOUNTER — Inpatient Hospital Stay (HOSPITAL_COMMUNITY): Payer: Medicare Other

## 2018-01-24 ENCOUNTER — Other Ambulatory Visit: Payer: Self-pay

## 2018-01-24 LAB — GLUCOSE, CAPILLARY
Glucose-Capillary: 105 mg/dL — ABNORMAL HIGH (ref 70–99)
Glucose-Capillary: 106 mg/dL — ABNORMAL HIGH (ref 70–99)
Glucose-Capillary: 108 mg/dL — ABNORMAL HIGH (ref 70–99)
Glucose-Capillary: 109 mg/dL — ABNORMAL HIGH (ref 70–99)
Glucose-Capillary: 119 mg/dL — ABNORMAL HIGH (ref 70–99)
Glucose-Capillary: 121 mg/dL — ABNORMAL HIGH (ref 70–99)
Glucose-Capillary: 131 mg/dL — ABNORMAL HIGH (ref 70–99)
Glucose-Capillary: 133 mg/dL — ABNORMAL HIGH (ref 70–99)
Glucose-Capillary: 136 mg/dL — ABNORMAL HIGH (ref 70–99)
Glucose-Capillary: 146 mg/dL — ABNORMAL HIGH (ref 70–99)
Glucose-Capillary: 150 mg/dL — ABNORMAL HIGH (ref 70–99)
Glucose-Capillary: 150 mg/dL — ABNORMAL HIGH (ref 70–99)
Glucose-Capillary: 153 mg/dL — ABNORMAL HIGH (ref 70–99)
Glucose-Capillary: 159 mg/dL — ABNORMAL HIGH (ref 70–99)
Glucose-Capillary: 160 mg/dL — ABNORMAL HIGH (ref 70–99)
Glucose-Capillary: 160 mg/dL — ABNORMAL HIGH (ref 70–99)
Glucose-Capillary: 161 mg/dL — ABNORMAL HIGH (ref 70–99)
Glucose-Capillary: 179 mg/dL — ABNORMAL HIGH (ref 70–99)
Glucose-Capillary: 180 mg/dL — ABNORMAL HIGH (ref 70–99)
Glucose-Capillary: 185 mg/dL — ABNORMAL HIGH (ref 70–99)
Glucose-Capillary: 193 mg/dL — ABNORMAL HIGH (ref 70–99)
Glucose-Capillary: 197 mg/dL — ABNORMAL HIGH (ref 70–99)
Glucose-Capillary: 249 mg/dL — ABNORMAL HIGH (ref 70–99)
Glucose-Capillary: 261 mg/dL — ABNORMAL HIGH (ref 70–99)
Glucose-Capillary: 96 mg/dL (ref 70–99)

## 2018-01-24 LAB — BASIC METABOLIC PANEL
Anion gap: 8 (ref 5–15)
BUN: 12 mg/dL (ref 8–23)
CALCIUM: 7.8 mg/dL — AB (ref 8.9–10.3)
CO2: 21 mmol/L — ABNORMAL LOW (ref 22–32)
Chloride: 111 mmol/L (ref 98–111)
Creatinine, Ser: 0.96 mg/dL (ref 0.61–1.24)
GFR calc Af Amer: >60 mL/min (ref >60)
Glucose, Bld: 150 mg/dL — ABNORMAL HIGH (ref 70–99)
Potassium: 3.6 mmol/L (ref 3.5–5.1)
SODIUM: 140 mmol/L (ref 135–145)

## 2018-01-24 LAB — MAGNESIUM
MAGNESIUM: 2.5 mg/dL — AB (ref 1.7–2.4)
MAGNESIUM: 2.4 mg/dL (ref 1.7–2.4)

## 2018-01-24 LAB — CBC
HCT: 26.8 % — ABNORMAL LOW (ref 39.0–52.0)
HCT: 26.1 % — ABNORMAL LOW (ref 39.0–52.0)
HEMOGLOBIN: 9 g/dL — AB (ref 13.0–17.0)
Hemoglobin: 9 g/dL — ABNORMAL LOW (ref 13.0–17.0)
MCH: 31.3 pg (ref 26.0–34.0)
MCH: 31.7 pg (ref 26.0–34.0)
MCHC: 33.6 g/dL (ref 30.0–36.0)
MCHC: 34.5 g/dL (ref 30.0–36.0)
MCV: 93.1 fL (ref 78.0–100.0)
MCV: 91.9 fL (ref 78.0–100.0)
PLATELETS: 99 10*3/uL — AB (ref 150–400)
PLATELETS: 109 10*3/uL — AB (ref 150–400)
RBC: 2.88 MIL/uL — ABNORMAL LOW (ref 4.22–5.81)
RBC: 2.84 MIL/uL — AB (ref 4.22–5.81)
RDW: 13.6 % (ref 11.5–15.5)
RDW: 14.7 % (ref 11.5–15.5)
WBC: 11 10*3/uL — ABNORMAL HIGH (ref 4.0–10.5)
WBC: 20.4 10*3/uL — ABNORMAL HIGH (ref 4.0–10.5)

## 2018-01-24 LAB — BPAM FFP
Blood Product Expiration Date: 201908012359
Blood Product Expiration Date: 201908012359
ISSUE DATE / TIME: 201907291250
ISSUE DATE / TIME: 201907291250
Unit Type and Rh: 6200
Unit Type and Rh: 6200

## 2018-01-24 LAB — PREPARE PLATELET PHERESIS: Unit division: 0

## 2018-01-24 LAB — POCT I-STAT 3, ART BLOOD GAS (G3+)
Acid-Base Excess: 2 mmol/L (ref 0.0–2.0)
Acid-base deficit: 5 mmol/L — ABNORMAL HIGH (ref 0.0–2.0)
Acid-base deficit: 6 mmol/L — ABNORMAL HIGH (ref 0.0–2.0)
Acid-base deficit: 4 mmol/L — ABNORMAL HIGH (ref 0.0–2.0)
Bicarbonate: 20 mmol/L (ref 20.0–28.0)
Bicarbonate: 19.3 mmol/L — ABNORMAL LOW (ref 20.0–28.0)
Bicarbonate: 21.4 mmol/L (ref 20.0–28.0)
Bicarbonate: 26.4 mmol/L (ref 20.0–28.0)
O2 Saturation: 99 %
O2 Saturation: 99 %
O2 Saturation: 98 %
O2 Saturation: 98 %
Patient temperature: 36.7
Patient temperature: 37
TCO2: 21 mmol/L — ABNORMAL LOW (ref 22–32)
TCO2: 20 mmol/L — ABNORMAL LOW (ref 22–32)
TCO2: 23 mmol/L (ref 22–32)
TCO2: 28 mmol/L (ref 22–32)
pCO2 arterial: 36.7 mmHg (ref 32.0–48.0)
pCO2 arterial: 35.9 mmHg (ref 32.0–48.0)
pCO2 arterial: 39.4 mmHg (ref 32.0–48.0)
pCO2 arterial: 41.2 mmHg (ref 32.0–48.0)
pH, Arterial: 7.343 — ABNORMAL LOW (ref 7.350–7.450)
pH, Arterial: 7.338 — ABNORMAL LOW (ref 7.350–7.450)
pH, Arterial: 7.344 — ABNORMAL LOW (ref 7.350–7.450)
pH, Arterial: 7.415 (ref 7.350–7.450)
pO2, Arterial: 156 mmHg — ABNORMAL HIGH (ref 83.0–108.0)
pO2, Arterial: 130 mmHg — ABNORMAL HIGH (ref 83.0–108.0)
pO2, Arterial: 105 mmHg (ref 83.0–108.0)
pO2, Arterial: 106 mmHg (ref 83.0–108.0)

## 2018-01-24 LAB — PREPARE FRESH FROZEN PLASMA
Unit division: 0
Unit division: 0

## 2018-01-24 LAB — BPAM PLATELET PHERESIS
Blood Product Expiration Date: 201907302359
ISSUE DATE / TIME: 201907291319
Unit Type and Rh: 6200

## 2018-01-24 LAB — POCT I-STAT, CHEM 8
BUN: 11 mg/dL (ref 8–23)
Calcium, Ion: 1.05 mmol/L — ABNORMAL LOW (ref 1.15–1.40)
Chloride: 100 mmol/L (ref 98–111)
Creatinine, Ser: 0.8 mg/dL (ref 0.61–1.24)
Glucose, Bld: 196 mg/dL — ABNORMAL HIGH (ref 70–99)
HCT: 26 % — ABNORMAL LOW (ref 39.0–52.0)
Hemoglobin: 8.8 g/dL — ABNORMAL LOW (ref 13.0–17.0)
Potassium: 4.3 mmol/L (ref 3.5–5.1)
Sodium: 137 mmol/L (ref 135–145)
TCO2: 25 mmol/L (ref 22–32)

## 2018-01-24 LAB — CREATININE, SERUM
CREATININE: 0.91 mg/dL (ref 0.61–1.24)
GFR calc Af Amer: >60 mL/min (ref >60)

## 2018-01-24 MED ORDER — INSULIN DETEMIR 100 UNIT/ML ~~LOC~~ SOLN
18.0000 [IU] | Freq: Two times a day (BID) | SUBCUTANEOUS | Status: DC
  Administered 2018-01-24: 18 [IU] via SUBCUTANEOUS
  Filled 2018-01-24 (×2): qty 0.18

## 2018-01-24 MED ORDER — FUROSEMIDE 10 MG/ML IJ SOLN
20.0000 mg | Freq: Once | INTRAMUSCULAR | Status: AC
  Administered 2018-01-24: 20 mg via INTRAVENOUS
  Filled 2018-01-24: qty 2

## 2018-01-24 MED ORDER — INSULIN ASPART 100 UNIT/ML ~~LOC~~ SOLN
0.0000 [IU] | SUBCUTANEOUS | Status: DC
  Administered 2018-01-24: 4 [IU] via SUBCUTANEOUS
  Administered 2018-01-24: 8 [IU] via SUBCUTANEOUS
  Administered 2018-01-25: 4 [IU] via SUBCUTANEOUS
  Administered 2018-01-25: 2 [IU] via SUBCUTANEOUS
  Administered 2018-01-25: 12 [IU] via SUBCUTANEOUS
  Administered 2018-01-25 – 2018-01-26 (×4): 2 [IU] via SUBCUTANEOUS
  Administered 2018-01-26 – 2018-01-27 (×2): 4 [IU] via SUBCUTANEOUS
  Administered 2018-01-27: 2 [IU] via SUBCUTANEOUS
  Administered 2018-01-27 – 2018-01-28 (×2): 4 [IU] via SUBCUTANEOUS
  Administered 2018-01-28 – 2018-01-29 (×2): 2 [IU] via SUBCUTANEOUS

## 2018-01-24 MED ORDER — ORAL CARE MOUTH RINSE: 15.0000 mL | Freq: Two times a day (BID) | OROMUCOSAL | Status: DC

## 2018-01-24 MED ORDER — POTASSIUM CHLORIDE 10 MEQ/50ML IV SOLN
10.0000 meq | INTRAVENOUS | Status: AC
  Administered 2018-01-24 (×3): 10 meq via INTRAVENOUS
  Filled 2018-01-24 (×3): qty 50

## 2018-01-24 MED ORDER — SODIUM BICARBONATE 8.4 % IV SOLN
50.0000 meq | Freq: Once | INTRAVENOUS | Status: AC
  Administered 2018-01-24: 50 meq via INTRAVENOUS

## 2018-01-24 MED ORDER — VANCOMYCIN HCL IN DEXTROSE 1-5 GM/200ML-% IV SOLN
1000.0000 mg | Freq: Two times a day (BID) | INTRAVENOUS | Status: AC
  Administered 2018-01-24 – 2018-01-25 (×2): 1000 mg via INTRAVENOUS
  Filled 2018-01-24 (×2): qty 200

## 2018-01-24 MED ORDER — POTASSIUM CHLORIDE CRYS ER 20 MEQ PO TBCR
40.0000 meq | EXTENDED_RELEASE_TABLET | Freq: Every day | ORAL | Status: DC
  Administered 2018-01-24 – 2018-01-26 (×3): 40 meq via ORAL
  Filled 2018-01-24 (×3): qty 2

## 2018-01-24 MED ORDER — MIDAZOLAM HCL 2 MG/2ML IJ SOLN: 1.0000 mg | INTRAMUSCULAR | Status: DC | PRN

## 2018-01-24 MED ORDER — INSULIN DETEMIR 100 UNIT/ML ~~LOC~~ SOLN
24.0000 [IU] | Freq: Two times a day (BID) | SUBCUTANEOUS | Status: DC
  Filled 2018-01-24: qty 0.24

## 2018-01-24 MED ORDER — INSULIN DETEMIR 100 UNIT/ML ~~LOC~~ SOLN
24.0000 [IU] | Freq: Two times a day (BID) | SUBCUTANEOUS | Status: DC
  Administered 2018-01-24: 24 [IU] via SUBCUTANEOUS
  Filled 2018-01-24 (×2): qty 0.24

## 2018-01-24 MED ORDER — FUROSEMIDE 10 MG/ML IJ SOLN
8.0000 mg/h | INTRAVENOUS | Status: DC
  Administered 2018-01-24 – 2018-01-25 (×3): 8 mg/h via INTRAVENOUS
  Filled 2018-01-24 (×4): qty 25
  Filled 2018-01-24: qty 21

## 2018-01-24 MED ORDER — PNEUMOCOCCAL VAC POLYVALENT 25 MCG/0.5ML IJ INJ
0.5000 mL | INJECTION | INTRAMUSCULAR | Status: DC | PRN
  Filled 2018-01-24: qty 0.5

## 2018-01-24 MED ORDER — AMIODARONE HCL 200 MG PO TABS
400.0000 mg | ORAL_TABLET | Freq: Two times a day (BID) | ORAL | Status: DC
  Administered 2018-01-24 (×2): 400 mg via ORAL
  Filled 2018-01-24 (×2): qty 2

## 2018-01-24 MED ORDER — INSULIN ASPART 100 UNIT/ML ~~LOC~~ SOLN
4.0000 [IU] | Freq: Three times a day (TID) | SUBCUTANEOUS | Status: DC
  Administered 2018-01-24 – 2018-01-29 (×2): 4 [IU] via SUBCUTANEOUS

## 2018-01-24 NOTE — Plan of Care (Signed)
  Problem: Education: Goal: Knowledge of General Education information will improve Description: Including pain rating scale, medication(s)/side effects and non-pharmacologic comfort measures Outcome: Progressing   Problem: Nutrition: Goal: Adequate nutrition will be maintained Outcome: Progressing   Problem: Coping: Goal: Level of anxiety will decrease Outcome: Progressing   Problem: Pain Managment: Goal: General experience of comfort will improve Outcome: Progressing   Problem: Activity: Goal: Risk for activity intolerance will decrease Outcome: Progressing   

## 2018-01-24 NOTE — Op Note (Signed)
NAME: Nathan [name], Nathan [name] MEDICAL RECORD IO:[id number] ACCOUNT [id number] DATE OF BIRTH:1946/05/24 FACILITY: [facility] LOCATION: [facility] PHYSICIAN:[name] III, MD  OPERATIVE REPORT  DATE OF PROCEDURE:  01/23/2018  OPERATION: 1.  Coronary artery bypass grafting x4 (left internal mammary artery to left anterior descending, saphenous vein graft to diagonal, saphenous vein graft to circumflex marginal, saphenous vein graft to posterior descending). 2.  Chordal sparing mitral valve replacement using a 29 mm bioprosthetic valve [facility] Ease pericardial valve, serial [id number]). 3.  Application of left atrial clip. 4.  Endoscopic harvest of right greater saphenous vein  SURGEON:  Kerin Perna, MD  ASSISTANT:  Lowella Dandy, PA-C.  POSTOPERATIVE DIAGNOSES: 1.  Severe 3-vessel coronary artery disease with moderate to severe left ventricular dysfunction, ischemic cardiomyopathy. 2.  Ischemic mitral valve regurgitation, moderate to severe. 3.  History of  intermittent atrial fibrillation.  POSTOPERATIVE DIAGNOSES: 1.  Severe 3-vessel coronary artery disease with moderate to severe left ventricular dysfunction, ischemic cardiomyopathy. 2.  Ischemic mitral valve regurgitation, moderate to severe. 3.  History of  intermittent atrial fibrillation.  ANESTHESIA:  General by Dr. Marguerita Merles.  CLINICAL NOTE:  The patient is a 72 year old diabetic reformed smoker with previous history of undiagnosed myocardial infarction and previous history of stroke.  Following his stroke, echocardiogram showed no mural thrombus, but severe LV dysfunction.   He subsequently underwent cardiac catheterization and was found to have severe 3-vessel coronary disease with ischemic cardiomyopathy and moderate to severe ischemic mitral regurgitation.  After recovery from his stroke, he was referred for  cardiothoracic surgical evaluation.  After evaluating the patient and reviewing his images of cardiac  catheterization and echocardiogram, I agreed with the cardiology recommendation for a combined CABG, mitral valve replacement as his best long-term  therapy.  However, he had necrotic teeth and underwent a dental exam and extractions and after recovering from his dental extractions, he was scheduled for surgery today.  I examined the patient in the office with his wife on multiple occasions and  reviewed the expected benefits of a combined CABG and mitral valve replacement as well as the potential risks of stroke, bleeding, blood transfusion reaction, infection, postoperative organ failure, postoperative pulmonary problems including pleural  effusion, infection, and death.  After several of these conversations and interviews, he demonstrated his understanding on the above issues and agreed to proceed with surgery on what I felt was informed consent.  OPERATIVE FINDINGS: 1.  Severe myocardial scarring of the inferior wall and lateral wall. 2.  Successful mitral valve replacement with a 29 mm tissue valve for correction of moderate to severe mitral regurgitation. 3.  Improvement in global LV function after separation from cardiopulmonary bypass by transesophageal echo. 4.  Severe coronary artery disease with poor targets for grafting in the right coronary and circumflex distribution.  LAD and the mammary were small vessels.  DESCRIPTION OF PROCEDURE:  The patient was brought to the operating room and placed supine on the operating table.  General anesthesia was induced.  A transesophageal echo probe was placed by the anesthesia team.  This confirmed the preoperative  diagnosis of ischemic cardiomyopathy, ischemic mitral regurgitation.  The patient was prepped and draped as a sterile field.  A proper time-out was performed.  A sternal incision was made as the saphenous vein was harvested endoscopically from the right  leg.  The left internal mammary artery was harvested as a pedicle graft from its origin  at the subclavian vessels.  It was a 1.4 mm  vessel with good flow.  A sternal retractor was placed and the pericardium opened and suspended.  Heparin was administered.  Pursestrings were placed in the ascending aorta and in the superior vena cava.  The patient was cannulated and placed on bypass.  A second pursestring  was placed at the area of the inferior vena cava for bicaval drainage.  The interatrial groove was dissected and the coronary targets were dissected out.  Cardioplegia cannulas were placed both antegrade and retrograde coronary sinus cardioplegia.  The  mammary artery and vein were prepared for the distal anastomosis.  The crossclamp was applied.  Then, 1.2 liters of cold blood cardioplegia was delivered in split doses between the antegrade aortic and retrograde coronary sinus catheters.  There was good  cardioplegic arrest and supple temperature dropped less than 12 degrees.  Cardioplegia was delivered every 20 minutes.  The distal coronary anastomoses were performed.  First, distal anastomosis was the posterior descending branch of right coronary.  This was totally occluded.  There was a small 1.3 mm vessel.  Reverse saphenous vein was sewn end-to-side with running 7-0  Prolene with good flow through the graft.  Cardioplegia was redosed.  A second distal anastomosis was the OM branch of the circumflex.  This was a larger 1.5 mm vessel with 99% proximal stenosis.  Reverse saphenous vein was sewn end-to-side with running 7-0 Prolene with good flow through the graft.  Cardioplegia was  redosed.  A third distal anastomosis was to the diagonal branch of the LAD.  This is an ostial 90% stenosis.  Reverse saphenous vein was sewn end-to-side with running 7-0 Prolene with good flow through the graft.  Cardioplegia was redosed.  The fourth distal anastomosis was the mid to distal third of the LAD.  It had a proximal 80% stenosis.  The left IMA pedicle was brought through an opening in the left  lateral pericardium and was brought down onto the LAD and sewn end-to-side with  running 8-0 Prolene.  There was good flow through the anastomosis after briefly releasing the pedicle bulldog and the mammary artery.  The bulldog was reapplied and the pedicle was secured to the epicardium.  Cardioplegia was redosed.  Attention was then directed to the mitral valve.  Caval tapes were tightened.  A left atriotomy was performed and atrial retractors placed.  Exposure of the mitral valve was adequate.  The cords and anterior leaflet were all stretched out.  It was  decided to perform a chordal sparing valve replacement for his ischemic MR is the best long-term durable therapy.  The main portion of the anterior leaflet was removed.  Ethibond 2-0 supra-annular pledgeted sutures were then placed around the annulus,  plicating the posterior leaflet to the annulus.  The annulus was sized to a 29 mm valve and a 29 mm Magna Ease valve was prepared.  The sutures were placed through the sewing ring and the valve seated and sutures were tied.  The valve seemed to conform  to the annulus well and there was no space for perivalvular leak.  The intracardiac procedure was performed under CO2 insufflation in the pericardial cradle.  The atriotomy was then closed in 2 layers using a running 4-0 Prolene.  A 40 mm left atrial clip was applied to the base the left atrial appendage.  While the cross clamp was still in place, 3 proximal vein anastomoses were performed on the ascending aorta using a 4.5 mm punch and running 6-0 Prolene.  Prior to tying  down the final proximal anastomosis, air was vented from the coronaries with a dose  of retrograde warm blood cardioplegia.  The crossclamp was removed.  The heart resumed a spontaneous rhythm.  The cardioplegia cannula was removed.  A needle vent was placed in the ascending aorta to scavenge any intracardiac air.  The vein grafts were deaired and opened.  Each had good flow, and  hemostasis was documented  at the proximal and distal anastomoses.  The patient was rewarmed and reperfused.  Temporary pacing wires were applied.  The patient was started on low dose milrinone and norepinephrine.  Lungs were expanded and the ventilator was resumed.  The patient was then weaned from cardiopulmonary bypass after being adequately rewarmed and reperfused.  Echo showed some improvement in global LV function.  The mitral valve was competent without leak.  There was no gradient across the valve.  Protamine  was administered.  There was no adverse reaction; however, there is still diffuse coagulopathy and bleeding.  Platelet count was low.  The patient was given FFP and platelets with improved coagulation function.  The suture lines were checked and found  to be hemostatic.  The patient continued to bleed.  He was given a dose of desmopressin.  He was given extra protamine.  The patient was still with diffuse coagulopathy and was given a small dose of recombinant factor VII.  Coagulation function improved.  Anterior mediastinum, and posterior mediastinum and bilateral pleural tubes were placed and brought out through separate incisions.  The sternum was closed with wire.  The superior pericardial fat had been closed over the aorta.  The pectoralis fascia  was closed with a running #1 Vicryl.  Subcutaneous and skin layers were closed using running Vicryl and sterile dressings were applied.  Total cardiopulmonary bypass time was 225 minutes.  TN/NUANCE  D:01/23/2018 T:01/24/2018 JOB:[id number]

## 2018-01-24 NOTE — Progress Notes (Signed)
Patient ID: Nathan Philipsandleman D Kady Jr., male   DOB: 10-09-1945, 72 y.o.   MRN: [id number] [facility] Evening Rounds:  Hemodynamically stable on milrinone 0.25, epi 5.  Atrial paced   Diuresing well on lasix drip  BMET    Component Value Date/Time   NA 137 01/24/2018 1533   NA 135 08/12/2017 1142   K 4.3 01/24/2018 1533   CL 100 01/24/2018 1533   CO2 21 (L) 01/24/2018 0330   GLUCOSE 196 (H) 01/24/2018 1533   BUN 11 01/24/2018 1533   BUN 17 08/12/2017 1142   CREATININE 0.80 01/24/2018 1533   CALCIUM 7.8 (L) 01/24/2018 0330   GFRNONAA >60 01/24/2018 0330   GFRAA >60 01/24/2018 0330    CBC    Component Value Date/Time   WBC 20.4 (H) 01/24/2018 1527   RBC 2.84 (L) 01/24/2018 1527   HGB 8.8 (L) 01/24/2018 1533   HGB 14.2 08/12/2017 1142   HCT 26.0 (L) 01/24/2018 1533   HCT 40.3 08/12/2017 1142   PLT 109 (L) 01/24/2018 1527   PLT 191 08/12/2017 1142   MCV 91.9 01/24/2018 1527   MCV 90 08/12/2017 1142   MCH 31.7 01/24/2018 1527   MCHC 34.5 01/24/2018 1527   RDW 14.7 01/24/2018 1527   RDW 13.0 08/12/2017 1142   LYMPHSABS 1.4 08/12/2017 1142   EOSABS 0.1 08/12/2017 1142   BASOSABS 0.0 08/12/2017 1142    Chest tubes still in. OOB to chair today  Overall remains stable.

## 2018-01-24 NOTE — Procedures (Signed)
Extubation Procedure Note  Patient Details:   Name: Alvira PhilipsRandleman D Jamroz Jr. DOB: 08-17-1945 MRN: [id number]   Airway Documentation:    Vent end date: 01/24/18 Vent end time: 0345   Evaluation  O2 sats: stable throughout Complications: No apparent complications Patient did tolerate procedure well. Bilateral Breath Sounds: Clear, Diminished   Yes  Patient tolerated rapid wean protocol. NIF -32 and VC 0.8 L. Positive for cuff leak. Patient extubated to a 3 Lpm nasal cannula. No signs of dyspnea or stridor noted. Patient instructed on the Incentive Spirometer achieving 500mL three times. RN at bedside.   Ancil BoozerSmallwood, Bexleigh Theriault 01/24/2018, 4:04 AM

## 2018-01-24 NOTE — Plan of Care (Signed)
  Problem: Clinical Measurements: Goal: Respiratory complications will improve Outcome: Progressing   Problem: Nutrition: Goal: Adequate nutrition will be maintained Outcome: Progressing   Problem: Coping: Goal: Level of anxiety will decrease Outcome: Progressing   Problem: Cardiac: Goal: Will achieve and/or maintain hemodynamic stability Outcome: Progressing   Problem: Urinary Elimination: Goal: Ability to achieve and maintain adequate renal perfusion and functioning will improve Outcome: Progressing   Problem: Activity: Goal: Risk for activity intolerance will decrease Outcome: Not Progressing   Problem: Pain Managment: Goal: General experience of comfort will improve Outcome: Not Progressing

## 2018-01-24 NOTE — Progress Notes (Addendum)
T[facility] DAILY ICU PROGRESS NOTE                   [address]            [place] [address]          [phone]   1 Day Post-Op Procedure(s) (LRB): MITRAL VALVE (MV) REPLACEMENT USING MAGNA MITRAL EASE PERICARDIAL BIOPROSTHESIS, MODEL 7300TFX, SIZE 29 MM, SERIAL # [id number], EXPIRATION  DATE 2021-03-03. (N/A) CORONARY ARTERY BYPASS GRAFTING (CABG) x 4 WITH ENDOSCOPIC HARVESTING OF RIGHT GREATER SAPHENOUS VEIN: LIMA TO LAD, SVG TO RCA, SVG TO DIAG, SVG TO OM  (N/A) TRANSESOPHAGEAL ECHOCARDIOGRAM (TEE) (N/A) LEFT ATRIAL APPENDAGE OCCLUSION USING ATRICURE ATRICLIP PRO2 LAA EXCLUSION SYSTEM  SIZE 40, LOT # [id number], CAT # [id number], EXP. DATE 2020-04-28 (Left)  Total Length of Stay:  LOS: 1 day   Subjective:  No specific complaints.  States his pain is okay, responds to medications.  He denies N/V  Objective: Vital signs in last 24 hours: Temp:  [95.5 F (35.3 C)-99 F (37.2 C)] 98.4 F (36.9 C) (07/30 0745) Pulse Rate:  [87-90] 90 (07/30 0745) Cardiac Rhythm: Atrial paced (07/30 0400) Resp:  [10-28] 15 (07/30 0745) BP: (86-124)/(60-76) 115/74 (07/30 0700) SpO2:  [99 %-100 %] 99 % (07/30 0745) Arterial Line BP: (98-150)/(49-67) 130/56 (07/30 0745) FiO2 (%):  [40 %-50 %] 40 % (07/30 0311) Weight:  [227 lb 8.2 oz (103.2 kg)] 227 lb 8.2 oz (103.2 kg) (07/30 0515)  Filed Weights   01/23/18 0602 01/24/18 0515  Weight: 204 lb 6.4 oz (92.7 kg) 227 lb 8.2 oz (103.2 kg)    Weight change: 23 lb 1.8 oz (10.5 kg)   Hemodynamic parameters for last 24 hours: PAP: (19-32)/(8-20) 32/9 CO:  [4.1 L/min-6.6 L/min] 6.6 L/min CI:  [1.9 L/min/m2-3.1 L/min/m2] 3.1 L/min/m2  Intake/Output from previous day: 07/29 0701 - 07/30 0700 In: 8214.4 [I.V.:4428.5; Blood:1540; NG/GT:30; IV Piggyback:2216] Out: 3750 [Urine:2300; Emesis/NG output:250; Blood:400; Chest Tube:800]  Intake/Output this shift: Total I/O In: -  Out: 160 [Urine:60; Chest Tube:100]  Current Meds: Scheduled Meds: .  acetaminophen  1,000 mg Oral Q6H   Or  . acetaminophen (TYLENOL) oral liquid 160 mg/5 mL  1,000 mg Per Tube Q6H  . amiodarone  400 mg Oral BID  . aspirin EC  325 mg Oral Daily   Or  . aspirin  324 mg Per Tube Daily  . bisacodyl  10 mg Oral Daily   Or  . bisacodyl  10 mg Rectal Daily  . Chlorhexidine Gluconate Cloth  6 each Topical Daily  . docusate sodium  200 mg Oral Daily  . furosemide  20 mg Intravenous Once  . insulin aspart  0-24 Units Subcutaneous Q4H  . insulin aspart  4 Units Subcutaneous TID WC  . insulin detemir  18 Units Subcutaneous BID  . mouth rinse  15 mL Mouth Rinse BID  . metoCLOPramide (REGLAN) injection  10 mg Intravenous Q6H  . metoprolol tartrate  12.5 mg Oral BID   Or  . metoprolol tartrate  12.5 mg Per Tube BID  . [START ON 01/25/2018] pantoprazole  40 mg Oral Daily  . simvastatin  20 mg Oral QHS  . sodium bicarbonate  50 mEq Intravenous Once  . sodium chloride flush  10-40 mL Intracatheter Q12H  . sodium chloride flush  3 mL Intravenous Q12H  . tamsulosin  0.4 mg Oral QHS   Continuous Infusions: . sodium chloride 20 mL/hr at 01/24/18 0755  .  sodium chloride    . sodium chloride Stopped (01/24/18 0305)  . albumin human 250 mL (01/23/18 2038)  . cefUROXime (ZINACEF)  IV 1.5 g (01/24/18 0757)  . EPINEPHrine 4 mg in dextrose 5% 250 mL infusion (16 mcg/mL) 5 mcg/min (01/24/18 0700)  . furosemide (LASIX) infusion    . insulin (NOVOLIN-R) infusion 10.4 mL/hr at 01/24/18 0700  . lactated ringers 20 mL/hr at 01/24/18 0700  . lactated ringers Stopped (01/23/18 2311)  . milrinone 0.25 mcg/kg/min (01/24/18 0700)  . nitroGLYCERIN Stopped (01/23/18 1600)  . norepinephrine (LEVOPHED) Adult infusion 5 mcg/min (01/24/18 0730)  . vancomycin     PRN Meds:.sodium chloride, albumin human, metoprolol tartrate, midazolam, morphine injection, ondansetron (ZOFRAN) IV, oxyCODONE, sodium chloride flush, sodium chloride flush, traMADol  General appearance: alert, cooperative  and no distress Heart: regular rate and rhythm Lungs: clear to auscultation bilaterally Abdomen: soft, non-tender; bowel sounds normal; no masses,  no organomegaly Extremities: edema trace Wound: clean and dry EVH sites, aquacel in place on sternotomy  Lab Results: CBC: Recent Labs    01/23/18 2132 01/23/18 2134 01/24/18 0330  WBC 10.2  --  11.0*  HGB 7.5* 6.8* 9.0*  HCT 22.9* 20.0* 26.8*  PLT 115*  --  99*   BMET:  Recent Labs    01/23/18 2134 01/24/18 0330  NA 142 140  K 3.5 3.6  CL 106 111  CO2  --  21*  GLUCOSE 184* 150*  BUN 11 12  CREATININE 0.70 0.96  CALCIUM  --  7.8*    CMET: Lab Results  Component Value Date   WBC 11.0 (H) 01/24/2018   HGB 9.0 (L) 01/24/2018   HCT 26.8 (L) 01/24/2018   PLT 99 (L) 01/24/2018   GLUCOSE 150 (H) 01/24/2018   CHOL 136 07/28/2017   TRIG 101 07/28/2017   HDL 34 (L) 07/28/2017   LDLCALC 82 07/28/2017   ALT 58 (H) 01/19/2018   AST 40 01/19/2018   NA 140 01/24/2018   K 3.6 01/24/2018   CL 111 01/24/2018   CREATININE 0.96 01/24/2018   BUN 12 01/24/2018   CO2 21 (L) 01/24/2018   TSH 2.140 07/28/2017   INR 1.16 01/23/2018   HGBA1C 5.9 (H) 01/19/2018      PT/INR:  Recent Labs    01/23/18 1611  LABPROT 14.7  INR 1.16   Radiology: Dg Chest Portable 1 View  Result Date: 01/23/2018 CLINICAL DATA:  Status post coronary bypass grafting EXAM: PORTABLE CHEST 1 VIEW COMPARISON:  01/19/2018 FINDINGS: Cardiac shadow is prominent. Postsurgical changes are seen consistent with the given clinical history. Swan-Ganz catheter is noted in the pulmonary outflow tract. Endotracheal tube, nasogastric catheter and mediastinal drain are seen. Pericardial drain and bilateral thoracostomy catheters are also noted. No pneumothorax is seen. Crowding of vascular markings is noted due to a poor inspiratory effort. No acute bony abnormality is seen. IMPRESSION: Tubes and lines as described. No pneumothorax is noted. Electronically Signed   By:  Alcide Clever M.D.   On: 01/23/2018 16:15     Assessment/Plan: S/P Procedure(s) (LRB): MITRAL VALVE (MV) REPLACEMENT USING MAGNA MITRAL EASE PERICARDIAL BIOPROSTHESIS, MODEL 7300TFX, SIZE 29 MM, SERIAL # [id number], EXPIRATION  DATE 2021-03-03. (N/A) CORONARY ARTERY BYPASS GRAFTING (CABG) x 4 WITH ENDOSCOPIC HARVESTING OF RIGHT GREATER SAPHENOUS VEIN: LIMA TO LAD, SVG TO RCA, SVG TO DIAG, SVG TO OM  (N/A) TRANSESOPHAGEAL ECHOCARDIOGRAM (TEE) (N/A) LEFT ATRIAL APPENDAGE OCCLUSION USING ATRICURE ATRICLIP PRO2 LAA EXCLUSION SYSTEM  SIZE 40, LOT # [id number], CAT #  [id number], EXP. DATE 2020-04-28 (Left)  1. CV- paced rhythm this morning, weaning Epinephrine, Milrinone, Levophed as tolerated 2. Pulm- extubated this morning without issues, CXR with atelectasis, start IS, wean oxygen as tolerated 3. Renal- creatinine WNL, weight is elevated, started on Lasix drip this morning, will add oral potassium supplementation 4. Expected post operative blood loss anemia, moderate Hgb is 9.0 will monitor 5. Expected post operative Thrombocytopenia, moderate at 99, will monitor 6. DM-sugars controlled, wean insulin drip, start Levemir and SSIP 7. Dispo- currently with paced rhythm, wean drips as hemodynamics allow, supplement K with lasix drip, watch Hgb and platelet count, POD #1 progression orders  Lowella Dandyrin Barrett 01/24/2018 8:14 AM   Patient stable - CABG MVR/ ischemic cardiomyopathy and MR Postop coagulopathy Postop junctional rhythm A-pacing Needs diuresis, mobilization and transition off iv insulin drip Leave chest tubes for now patient examined and medical record reviewed,agree with above note. Kathlee Nationseter Van Trigt III 01/24/2018

## 2018-01-25 ENCOUNTER — Inpatient Hospital Stay (HOSPITAL_COMMUNITY): Payer: Medicare Other

## 2018-01-25 ENCOUNTER — Inpatient Hospital Stay: Payer: Self-pay

## 2018-01-25 LAB — BASIC METABOLIC PANEL
Anion gap: 8 (ref 5–15)
BUN: 12 mg/dL (ref 8–23)
CO2: 30 mmol/L (ref 22–32)
Calcium: 8 mg/dL — ABNORMAL LOW (ref 8.9–10.3)
Chloride: 96 mmol/L — ABNORMAL LOW (ref 98–111)
Creatinine, Ser: 0.99 mg/dL (ref 0.61–1.24)
GFR calc Af Amer: >60 mL/min (ref >60)
GFR calc non Af Amer: >60 mL/min (ref >60)
Glucose, Bld: 206 mg/dL — ABNORMAL HIGH (ref 70–99)
Potassium: 4.1 mmol/L (ref 3.5–5.1)
Sodium: 134 mmol/L — ABNORMAL LOW (ref 135–145)

## 2018-01-25 LAB — GLUCOSE, CAPILLARY
Glucose-Capillary: 144 mg/dL — ABNORMAL HIGH (ref 70–99)
Glucose-Capillary: 153 mg/dL — ABNORMAL HIGH (ref 70–99)
Glucose-Capillary: 194 mg/dL — ABNORMAL HIGH (ref 70–99)
Glucose-Capillary: 83 mg/dL (ref 70–99)
Glucose-Capillary: 92 mg/dL (ref 70–99)
Glucose-Capillary: 94 mg/dL (ref 70–99)

## 2018-01-25 LAB — POCT I-STAT, CHEM 8
BUN: 15 mg/dL (ref 8–23)
Calcium, Ion: 1.13 mmol/L — ABNORMAL LOW (ref 1.15–1.40)
Chloride: 93 mmol/L — ABNORMAL LOW (ref 98–111)
Creatinine, Ser: 1 mg/dL (ref 0.61–1.24)
Glucose, Bld: 94 mg/dL (ref 70–99)
HCT: 23 % — ABNORMAL LOW (ref 39.0–52.0)
Hemoglobin: 7.8 g/dL — ABNORMAL LOW (ref 13.0–17.0)
Potassium: 3.9 mmol/L (ref 3.5–5.1)
Sodium: 134 mmol/L — ABNORMAL LOW (ref 135–145)
TCO2: 28 mmol/L (ref 22–32)

## 2018-01-25 LAB — COOXEMETRY PANEL
Carboxyhemoglobin: 1 % (ref 0.5–1.5)
Methemoglobin: 2 % — ABNORMAL HIGH (ref 0.0–1.5)
O2 Saturation: 60.3 %
Total hemoglobin: 8.3 g/dL — ABNORMAL LOW (ref 12.0–16.0)

## 2018-01-25 LAB — CBC
HCT: 25.5 % — ABNORMAL LOW (ref 39.0–52.0)
Hemoglobin: 8.6 g/dL — ABNORMAL LOW (ref 13.0–17.0)
MCH: 31.6 pg (ref 26.0–34.0)
MCHC: 33.7 g/dL (ref 30.0–36.0)
MCV: 93.8 fL (ref 78.0–100.0)
Platelets: 95 10*3/uL — ABNORMAL LOW (ref 150–400)
RBC: 2.72 MIL/uL — ABNORMAL LOW (ref 4.22–5.81)
RDW: 14.9 % (ref 11.5–15.5)
WBC: 20.2 10*3/uL — ABNORMAL HIGH (ref 4.0–10.5)

## 2018-01-25 LAB — PREPARE RBC (CROSSMATCH)

## 2018-01-25 MED ORDER — SODIUM CHLORIDE 0.9% FLUSH: 10.0000 mL | INTRAVENOUS | Status: DC | PRN

## 2018-01-25 MED ORDER — PROMETHAZINE HCL 25 MG/ML IJ SOLN
12.5000 mg | Freq: Four times a day (QID) | INTRAMUSCULAR | Status: DC | PRN
  Administered 2018-01-25: 12.5 mg via INTRAVENOUS
  Filled 2018-01-25 (×2): qty 1

## 2018-01-25 MED ORDER — METOLAZONE 5 MG PO TABS
5.0000 mg | ORAL_TABLET | Freq: Every day | ORAL | Status: AC
  Administered 2018-01-25 – 2018-01-28 (×4): 5 mg via ORAL
  Filled 2018-01-25 (×4): qty 1

## 2018-01-25 MED ORDER — POTASSIUM CHLORIDE 10 MEQ/50ML IV SOLN
10.0000 meq | INTRAVENOUS | Status: AC
  Administered 2018-01-25 (×2): 10 meq via INTRAVENOUS
  Filled 2018-01-25: qty 50

## 2018-01-25 MED ORDER — POTASSIUM CHLORIDE 10 MEQ/50ML IV SOLN
10.0000 meq | INTRAVENOUS | Status: AC
  Administered 2018-01-25 (×2): 10 meq via INTRAVENOUS
  Filled 2018-01-25 (×2): qty 50

## 2018-01-25 MED ORDER — CHLORHEXIDINE GLUCONATE CLOTH 2 % EX PADS: 6.0000 | MEDICATED_PAD | Freq: Every day | CUTANEOUS | Status: DC

## 2018-01-25 MED ORDER — MORPHINE SULFATE (PF) 2 MG/ML IV SOLN
2.0000 mg | INTRAVENOUS | Status: DC | PRN
Start: 2018-01-25 — End: 2018-01-25

## 2018-01-25 MED ORDER — INSULIN DETEMIR 100 UNIT/ML ~~LOC~~ SOLN
28.0000 [IU] | Freq: Two times a day (BID) | SUBCUTANEOUS | Status: DC
  Administered 2018-01-25: 28 [IU] via SUBCUTANEOUS
  Filled 2018-01-25 (×3): qty 0.28

## 2018-01-25 MED ORDER — SODIUM CHLORIDE 0.9% FLUSH
10.0000 mL | Freq: Two times a day (BID) | INTRAVENOUS | Status: DC
  Administered 2018-01-25 – 2018-01-26 (×3): 10 mL
  Administered 2018-01-26 – 2018-01-27 (×2): 20 mL
  Administered 2018-01-27: 10 mL
  Administered 2018-01-28 – 2018-01-29 (×4): 20 mL
  Administered 2018-01-30: 10 mL
  Administered 2018-01-30 – 2018-01-31 (×2): 20 mL
  Administered 2018-01-31: 10 mL
  Administered 2018-02-01 – 2018-02-02 (×3): 20 mL
  Administered 2018-02-02 – 2018-02-03 (×2): 10 mL

## 2018-01-25 MED ORDER — DIAZEPAM 5 MG PO TABS
5.0000 mg | ORAL_TABLET | Freq: Once | ORAL | Status: AC
  Administered 2018-01-25: 5 mg via ORAL
  Filled 2018-01-25: qty 1

## 2018-01-25 MED ORDER — FENTANYL CITRATE (PF) 100 MCG/2ML IJ SOLN
12.5000 ug | INTRAMUSCULAR | Status: DC | PRN
  Administered 2018-01-26: 12.5 ug via INTRAVENOUS
  Filled 2018-01-25: qty 2

## 2018-01-25 NOTE — Progress Notes (Signed)
Right Pleural Chest tube and Posterior mediastinal chest tube removed per MD Donata ClayVan Trigt order.  Remaining Left pleural tube and mediastinal tube connected to one sahara drainage system.  Patient tolerated procedure well.

## 2018-01-25 NOTE — Progress Notes (Signed)
Peripherally Inserted Central Catheter/Midline Placement  The IV Nurse has discussed with the patient and/or persons authorized to consent for the patient, the purpose of this procedure and the potential benefits and risks involved with this procedure.  The benefits include less needle sticks, lab draws from the catheter, and the patient may be discharged home with the catheter. Risks include, but not limited to, infection, bleeding, blood clot (thrombus formation), and puncture of an artery; nerve damage and irregular heartbeat and possibility to perform a PICC exchange if needed/ordered by physician.  Alternatives to this procedure were also discussed.  Bard Power PICC patient education guide, fact sheet on infection prevention and patient information card has been provided to patient /or left at bedside.  Wife signed consent due to pt.s confusion.  PICC/Midline Placement Documentation  PICC Double Lumen 01/25/18 PICC Right Basilic 36 cm 0 cm (Active)  Indication for Insertion or Continuance of Line Vasoactive infusions 01/25/2018 12:35 PM  Exposed Catheter (cm) 0 cm 01/25/2018 12:35 PM  Site Assessment Clean;Dry;Intact 01/25/2018 12:35 PM  Lumen #1 Status Flushed;Saline locked;Blood return noted 01/25/2018 12:35 PM  Lumen #2 Status Flushed;Saline locked;Blood return noted 01/25/2018 12:35 PM  Dressing Type Transparent 01/25/2018 12:35 PM  Dressing Status Clean;Dry;Intact;Antimicrobial disc in place 01/25/2018 12:35 PM  Dressing Change Due 02/01/18 01/25/2018 12:35 PM       Ethelda Chickurrie, Jonavon Trieu Robert 01/25/2018, 12:36 PM

## 2018-01-25 NOTE — Progress Notes (Signed)
Paged Laneta SimmersBartle, MD about patient's CBGs being greater than 200. Ordered to treat this one with sliding scale insulin and to restart the insulin gtt if the 0400 CBG is greater than 260.

## 2018-01-25 NOTE — Progress Notes (Addendum)
[address]       [place] [address]             [phone]      2 Days Post-Op Procedure(s) (LRB): MITRAL VALVE (MV) REPLACEMENT USING MAGNA MITRAL EASE PERICARDIAL BIOPROSTHESIS, MODEL 7300TFX, SIZE 29 MM, SERIAL # [id number], EXPIRATION  DATE 2021-03-03. (N/A) CORONARY ARTERY BYPASS GRAFTING (CABG) x 4 WITH ENDOSCOPIC HARVESTING OF RIGHT GREATER SAPHENOUS VEIN: LIMA TO LAD, SVG TO RCA, SVG TO DIAG, SVG TO OM  (N/A) TRANSESOPHAGEAL ECHOCARDIOGRAM (TEE) (N/A) LEFT ATRIAL APPENDAGE OCCLUSION USING ATRICURE ATRICLIP PRO2 LAA EXCLUSION SYSTEM  SIZE 40, LOT # [id number], CAT # [id number], EXP. DATE 2020-04-28 (Left)   Subjective:  Patient with pain and nausea this morning.  States his nausea is constant.    Objective: Vital signs in last 24 hours: Temp:  [97.7 F (36.5 C)-99 F (37.2 C)] 98.3 F (36.8 C) (07/31 0800) Pulse Rate:  [88-96] 89 (07/31 0800) Cardiac Rhythm: Atrial paced (07/31 0400) Resp:  [9-26] 17 (07/31 0800) BP: (100-142)/(61-93) 110/67 (07/31 0800) SpO2:  [91 %-100 %] 98 % (07/31 0800) Arterial Line BP: (108-185)/(46-93) 114/46 (07/31 0700)  Hemodynamic parameters for last 24 hours: CVP:  [6 mmHg-16 mmHg] 6 mmHg  Intake/Output from previous day: 07/30 0701 - 07/31 0700 In: 5389.5 [P.O.:720; I.V.:1749.4; IV Piggyback:2920.1] Out: 6395 [Urine:5425; Chest Tube:970] Intake/Output this shift: Total I/O In: 88.2 [I.V.:62.8; IV Piggyback:25.4] Out: 165 [Urine:115; Chest Tube:50]  General appearance: alert, cooperative and no distress Heart: regular rate and rhythm and paced Lungs: clear to auscultation bilaterally Abdomen: soft, mild distention, hypoactive BS Extremities: edema trace Wound: clean and dry, aquacel in place on sternotomy  Lab Results: Recent Labs    01/24/18 1527 01/24/18 1533 01/25/18 0345  WBC 20.4*  --  20.2*  HGB 9.0* 8.8* 8.6*  HCT 26.1* 26.0* 25.5*  PLT 109*  --  95*   BMET:  Recent Labs    01/24/18 0330   01/24/18 1533 01/25/18 0345  NA 140  --  137 134*  K 3.6  --  4.3 4.1  CL 111  --  100 96*  CO2 21*  --   --  30  GLUCOSE 150*  --  196* 206*  BUN 12  --  11 12  CREATININE 0.96   < > 0.80 0.99  CALCIUM 7.8*  --   --  8.0*   < > = values in this interval not displayed.    PT/INR:  Recent Labs    01/23/18 1611  LABPROT 14.7  INR 1.16   ABG    Component Value Date/Time   PHART 7.415 01/24/2018 1538   HCO3 26.4 01/24/2018 1538   TCO2 28 01/24/2018 1538   ACIDBASEDEF 4.0 (H) 01/24/2018 0808   O2SAT 60.3 01/25/2018 0342   CBG (last 3)  Recent Labs    01/24/18 1942 01/24/18 2341 01/25/18 0737  GLUCAP 249* 261* 144*    Assessment/Plan: S/P Procedure(s) (LRB): MITRAL VALVE (MV) REPLACEMENT USING MAGNA MITRAL EASE PERICARDIAL BIOPROSTHESIS, MODEL 7300TFX, SIZE 29 MM, SERIAL # [id number], EXPIRATION  DATE 2021-03-03. (N/A) CORONARY ARTERY BYPASS GRAFTING (CABG) x 4 WITH ENDOSCOPIC HARVESTING OF RIGHT GREATER SAPHENOUS VEIN: LIMA TO LAD, SVG TO RCA, SVG TO DIAG, SVG TO OM  (N/A) TRANSESOPHAGEAL ECHOCARDIOGRAM (TEE) (N/A) LEFT ATRIAL APPENDAGE OCCLUSION USING ATRICURE ATRICLIP PRO2 LAA EXCLUSION SYSTEM  SIZE 40, LOT # [id number], CAT # [id number], EXP. DATE 2020-04-28 (Left)  1. CV- remains A Paced- wean Milrinone,  Epi as tolerated- on Amiodarone for A. Fib prophylaxis 2. Pulm- wean oxygen as tolerated, CXR with bilateral apical pneumothorax, CT output is moderate, leave in place today 3. Renal- creatinine remains WNL, on Lasix drip, weight is trending down, continue to supplement K 4. GI- persistent nausea, likely related to Amiodarone use and hypoactive BS, on liquid diet continue for now, continue reglan, zofran prn 5. Expected post operative Thrombocytopenia, ct at 99K monitor 6. DM- sugars were elevated yesterday, insulin regimen has been adjusted will follow 7. Dispo- patient stable, remains A. Paced, wean Milrinone, Epi as tolerated, leave chest tubes in place for now, continue  aggressive diuretics, watch platelets, continue current care   LOS: 2 days    Nathan [name] 7/31/2019up to chair with lift PT will get him up later Chest tube output decreasing A-pacing DC morphine , oxycodone, amiodarone due to nausea Hold coumadin for now patient examined and medical record reviewed,agree with above note. Kathlee Nationseter Van Trigt III 01/25/2018

## 2018-01-25 NOTE — Progress Notes (Signed)
CT Surgery  Able to stand with assist x3 Remains on low dose epi, milrinone A-pacing Picc in place- remove neck line Hb 7 with soft BP - transfuse 1 U blood

## 2018-01-25 NOTE — Progress Notes (Signed)
PT Cancellation Note  Patient Details Name: Nathan PhilipsRandleman D Cella Jr. MRN: [id number] DOB: February 03, 1946   Cancelled Treatment:    Reason Eval/Treat Not Completed: Medical issues which prohibited therapy, Spoke to RN. Pt currently feeling poor, MD visiting with patient. RN agreeable for therapy to return this PM and attempt mobility.   Nathan [name], PT, DPT Acute Rehab Services Pager: [phone]     Nathan [name] 01/25/2018, 9:31 AM

## 2018-01-25 NOTE — Progress Notes (Signed)
Received phone call from IV team advising PICC line placement confirmed by xray and PICC line is okay to use.

## 2018-01-26 ENCOUNTER — Inpatient Hospital Stay (HOSPITAL_COMMUNITY): Payer: Medicare Other

## 2018-01-26 ENCOUNTER — Other Ambulatory Visit: Payer: Self-pay

## 2018-01-26 DIAGNOSIS — Z9889 Other specified postprocedural states: Secondary | ICD-10-CM

## 2018-01-26 HISTORY — DX: Other specified postprocedural states: Z98.890

## 2018-01-26 LAB — POCT I-STAT, CHEM 8
BUN: 19 mg/dL (ref 8–23)
Calcium, Ion: 1.11 mmol/L — ABNORMAL LOW (ref 1.15–1.40)
Chloride: 87 mmol/L — ABNORMAL LOW (ref 98–111)
Creatinine, Ser: 1 mg/dL (ref 0.61–1.24)
Glucose, Bld: 156 mg/dL — ABNORMAL HIGH (ref 70–99)
HCT: 27 % — ABNORMAL LOW (ref 39.0–52.0)
Hemoglobin: 9.2 g/dL — ABNORMAL LOW (ref 13.0–17.0)
Potassium: 3.2 mmol/L — ABNORMAL LOW (ref 3.5–5.1)
Sodium: 134 mmol/L — ABNORMAL LOW (ref 135–145)
TCO2: 29 mmol/L (ref 22–32)

## 2018-01-26 LAB — GLUCOSE, CAPILLARY
Glucose-Capillary: 110 mg/dL — ABNORMAL HIGH (ref 70–99)
Glucose-Capillary: 208 mg/dL — ABNORMAL HIGH (ref 70–99)
Glucose-Capillary: 127 mg/dL — ABNORMAL HIGH (ref 70–99)
Glucose-Capillary: 144 mg/dL — ABNORMAL HIGH (ref 70–99)
Glucose-Capillary: 159 mg/dL — ABNORMAL HIGH (ref 70–99)
Glucose-Capillary: 163 mg/dL — ABNORMAL HIGH (ref 70–99)

## 2018-01-26 LAB — BPAM RBC
Blood Product Expiration Date: 201908102359
Blood Product Expiration Date: 201908202359
ISSUE DATE / TIME: 201907292259
ISSUE DATE / TIME: 201907311955
Unit Type and Rh: 6200
Unit Type and Rh: 6200

## 2018-01-26 LAB — BASIC METABOLIC PANEL
Anion gap: 9 (ref 5–15)
BUN: 17 mg/dL (ref 8–23)
CHLORIDE: 94 mmol/L — AB (ref 98–111)
CO2: 30 mmol/L (ref 22–32)
CREATININE: 1.13 mg/dL (ref 0.61–1.24)
Calcium: 7.8 mg/dL — ABNORMAL LOW (ref 8.9–10.3)
GFR calc non Af Amer: >60 mL/min (ref >60)
Glucose, Bld: 174 mg/dL — ABNORMAL HIGH (ref 70–99)
Potassium: 3.3 mmol/L — ABNORMAL LOW (ref 3.5–5.1)
SODIUM: 133 mmol/L — AB (ref 135–145)

## 2018-01-26 LAB — TYPE AND SCREEN
ABO/RH(D): A POS
Antibody Screen: NEGATIVE
Unit division: 0
Unit division: 0

## 2018-01-26 LAB — CBC
HCT: 26.3 % — ABNORMAL LOW (ref 39.0–52.0)
HEMOGLOBIN: 8.7 g/dL — AB (ref 13.0–17.0)
MCH: 30.7 pg (ref 26.0–34.0)
MCHC: 33.1 g/dL (ref 30.0–36.0)
MCV: 92.9 fL (ref 78.0–100.0)
Platelets: 82 10*3/uL — ABNORMAL LOW (ref 150–400)
RBC: 2.83 MIL/uL — AB (ref 4.22–5.81)
RDW: 15 % (ref 11.5–15.5)
WBC: 15 10*3/uL — ABNORMAL HIGH (ref 4.0–10.5)

## 2018-01-26 LAB — COOXEMETRY PANEL
Carboxyhemoglobin: 1.5 % (ref 0.5–1.5)
Methemoglobin: 1.8 % — ABNORMAL HIGH (ref 0.0–1.5)
O2 Saturation: 67.2 %
Total hemoglobin: 8.9 g/dL — ABNORMAL LOW (ref 12.0–16.0)

## 2018-01-26 MED ORDER — INSULIN DETEMIR 100 UNIT/ML ~~LOC~~ SOLN
24.0000 [IU] | Freq: Two times a day (BID) | SUBCUTANEOUS | Status: DC
  Administered 2018-01-26 (×2): 24 [IU] via SUBCUTANEOUS
  Filled 2018-01-26 (×4): qty 0.24

## 2018-01-26 MED ORDER — POTASSIUM CHLORIDE 10 MEQ/50ML IV SOLN
10.0000 meq | INTRAVENOUS | Status: AC
  Administered 2018-01-26 (×2): 10 meq via INTRAVENOUS
  Filled 2018-01-26: qty 50

## 2018-01-26 MED ORDER — POTASSIUM CHLORIDE 10 MEQ/50ML IV SOLN
10.0000 meq | INTRAVENOUS | Status: AC
  Administered 2018-01-26 (×3): 10 meq via INTRAVENOUS
  Filled 2018-01-26 (×3): qty 50

## 2018-01-26 MED ORDER — ENOXAPARIN SODIUM 40 MG/0.4ML ~~LOC~~ SOLN
40.0000 mg | SUBCUTANEOUS | Status: AC
  Administered 2018-01-26 – 2018-02-01 (×7): 40 mg via SUBCUTANEOUS
  Filled 2018-01-26 (×7): qty 0.4

## 2018-01-26 MED ORDER — OXYCODONE HCL 5 MG PO TABS
5.0000 mg | ORAL_TABLET | ORAL | Status: DC | PRN
  Administered 2018-01-27 – 2018-02-03 (×11): 5 mg via ORAL
  Filled 2018-01-26 (×12): qty 1

## 2018-01-26 MED ORDER — CARVEDILOL 3.125 MG PO TABS
3.1250 mg | ORAL_TABLET | Freq: Two times a day (BID) | ORAL | Status: DC
  Administered 2018-01-26 – 2018-01-29 (×6): 3.125 mg via ORAL
  Filled 2018-01-26 (×6): qty 1

## 2018-01-26 MED ORDER — FUROSEMIDE 10 MG/ML IJ SOLN
20.0000 mg | Freq: Four times a day (QID) | INTRAMUSCULAR | Status: DC
Start: 2018-01-27 — End: 2018-01-27
  Administered 2018-01-27: 20 mg via INTRAVENOUS
  Filled 2018-01-26: qty 2

## 2018-01-26 MED ORDER — POTASSIUM CHLORIDE 10 MEQ/50ML IV SOLN
10.0000 meq | INTRAVENOUS | Status: AC
  Administered 2018-01-26 (×3): 10 meq via INTRAVENOUS
  Filled 2018-01-26 (×4): qty 50

## 2018-01-26 MED ORDER — POTASSIUM CHLORIDE 10 MEQ/50ML IV SOLN
10.0000 meq | INTRAVENOUS | Status: AC
  Administered 2018-01-26 (×2): 10 meq via INTRAVENOUS
  Filled 2018-01-26 (×2): qty 50

## 2018-01-26 NOTE — Progress Notes (Signed)
      [address]       [place] [id number]             [phone]      POD # 3 MVR, CABG x 4  Resting quietly BP 100/63   Pulse 89   Temp 98.5 F (36.9 C) (Oral)   Resp 16   Ht 5' 11.5" (1.816 m)   Wt 209 lb 14.1 oz (95.2 kg)   SpO2 98%   BMI 28.86 kg/m  CVP= 8  Intake/Output Summary (Last 24 hours) at 01/26/2018 2035 Last data filed at 01/26/2018 2000 Gross per 24 hour  Intake 1965.38 ml  Output 5835 ml  Net -3869.62 ml  K= 3.2 Creatinine 1.0  Viviann SpareSteven C. Dorris FetchHendrickson, MD [facility] [phone]

## 2018-01-26 NOTE — Discharge Summary (Addendum)
Physician Discharge Summary  Patient ID: Nathan [name]. MRN: [id number] DOB/AGE: June 10, 1946 72 y.o.  Admit date: 01/23/2018 Discharge date: 02/03/2018  Admission Diagnoses:  Patient Active Problem List   Diagnosis Date Noted  . Mitral valve insufficiency   . On amiodarone therapy 07/28/2017  . CAD in native artery 07/28/2017  . Chronic anticoagulation 05/18/2017  . LBBB (left bundle branch block) 05/18/2017  . Hospital discharge follow-up 05/09/2017  . Paroxysmal atrial fibrillation (HCC) 05/01/2017  . Dilated cardiomyopathy (HCC) 05/01/2017  . DM (diabetes mellitus) type 2, uncontrolled, with ketoacidosis (HCC) 05/01/2017  . Hypotension 05/01/2017  . HLD (hyperlipidemia) 05/01/2017  . Cerebrovascular accident (CVA) due to embolism of right middle cerebral artery (HCC) 04/29/2017  . Hypertensive heart disease with heart failure (HCC) 11/26/2016  . Mixed dyslipidemia 11/26/2016  . Chronic systolic heart failure (HCC) 04/24/2015  . IVCD (intraventricular conduction defect) 04/24/2015   Discharge Diagnoses:   Patient Active Problem List   Diagnosis Date Noted  . Pressure injury of skin 02/02/2018  . Malnutrition of moderate degree 02/01/2018  . Coronary artery disease involving native coronary artery of native heart with angina pectoris (HCC)   . Hx of CABG   . Atrial fibrillation (HCC)   . Chronic combined systolic and diastolic CHF (congestive heart failure) (HCC)   . Diabetes mellitus type 2 in nonobese (HCC)   . Essential hypertension   . History of CVA (cerebrovascular accident)   . Acute blood loss anemia   . Hyponatremia   . S/P left atrial appendage ligation 01/26/2018  . S/P CABG x 4 01/23/2018  . S/P MVR (mitral valve replacement) 01/23/2018  . Mitral valve insufficiency   . On amiodarone therapy 07/28/2017  . CAD in native artery 07/28/2017  . Chronic anticoagulation 05/18/2017  . LBBB (left bundle branch block) 05/18/2017  . Hospital discharge  follow-up 05/09/2017  . Paroxysmal atrial fibrillation (HCC) 05/01/2017  . Dilated cardiomyopathy (HCC) 05/01/2017  . DM (diabetes mellitus) type 2, uncontrolled, with ketoacidosis (HCC) 05/01/2017  . Hypotension 05/01/2017  . HLD (hyperlipidemia) 05/01/2017  . Cerebrovascular accident (CVA) due to embolism of right middle cerebral artery (HCC) 04/29/2017  . Hypertensive heart disease with heart failure (HCC) 11/26/2016  . Mixed dyslipidemia 11/26/2016  . Chronic systolic heart failure (HCC) 04/24/2015  . IVCD (intraventricular conduction defect) 04/24/2015   Discharged Condition: good  History of Present Illness:  Nathan [name] is a 72 yo diabetic white male.  He presented in November of 2018 after suffering an embolic right sided cerebral stroke.  He was found to have a clot that was removed by IR.  He was noted to be in Atrial Fibrillation and was started on anticoagulation at that time.  He was also underwent Echocardiogram during that admission which showed a reduced EF of 35 % and moderate to severe ischemic mitral regurgitation.  Since discharge he underwent further evaluation and workup by Dr. Okey [name].  He had a cardiac catheterization in march which showed severe 3 vessel CAD with a 70% LM involvement.  It was felt he would require surgical intervention for his Mitral Valve and CAD.  He was subsequently evaluated by Dr. Donata [name] in March at which time the patient denied chest pain or shortness of breath.  He was recovering well from his stroke with some improvement of his left sided weakness, but he continued to have memory issues.  It was felt the patient would require surgery in the near future.  However, Echocardiogram images were felt to  be sub optimal and it was recommended the patient undergo TEE prior to surgery.  He was also noted to have poor dentition and was referred to Dr. Kristin [name] who ultimately performed dental extractions on the patient.  He would also require PFT testing as he  smoked in his past.  He continued to follow intermittently with Dr. Donata [name].  Once workup and dental work was complete he was most recently evaluated by Dr. Donata [name] on 01/18/2018 at which time it was felt safe to proceed with surgery.  The risks and benefits of the procedure were explained to the patient and he was agreeable to proceed.  Hospital Course:   Nathan [name] presented to [facility] on 01/23/2018.  He was taken to the operating room and underwent CABG x 4 utilizing LIMA to LAD, SVG to PDA, SVG to OM, and SVG to Diagonal.  He underwent Mitral Valve Replacement with a 29 mm [facility] Ease Pericardial tissue valve.  He underwent Clipping of his LA Appendage with a 40 mm Atricure Proclip 2.  Finally he underwent endoscopic harvest of his greater saphenous vein from his right leg.  He tolerated the procedure without difficulty and was taken to the SICU in stable condition.  During his stay in the SICU the patient developed expected post operative blood loss anemia.  He required transfusion of packed cells.  He was weaned and extubated early morning POD #1.  He was weaned off Epinephrine, Milrinone, and Levophed as tolerated.  He was started on aggressive diuresis with a Lasix drip for hypervolemia.  His right and left pleural chest tubes were removed on 01/25/2018.  His arterial line and remaining chest tubes were removed on 01/26/2018.  He continued to have low hemoglobin and required transfusion of additional blood products.  His volume status significantly improved.  He was transitioned to an oral regimen of Lasix.  He was hypokalemic and required additional potassium supplementation.  He developed mental status changes.  Head CT scan was obtained and showed some progression in the patient's previous right MCA infarct in posterior right frontal region.  He developed dysphagia and SLP evaluation was obtained.  They felt patient was at increased risk of aspiration and he was placed on a dysphagia  1 diet.  The patient remained in Atrial Fibrillation with HR in the 50-60s.  He was started on Coumadin.  He remained deconditioned with worsening of stroke.  He participated with PT who recommended SNF placement.  CIR consult was placed and they felt patient was a reasonable candidate for admission, pending insurance authorization.  He developed sudden onset urinary retention on POD #10.  He required I/O catheterization to be performed several times.  Due to this foley catheter was placed and a urine sample was sent for workup.  This was concerning for UA and the patient was started on Keflex (per Dr. Donata [name]) for this.  He was evaluated by the Advance heart failure team who adjusted medications as hemodynamics allowed.  Repeat Echocardiogram was obtained and showed his mitral valve prosthesis to be functioning well, but he did have a small decrease in his EF to 20-25%.  He remains on coumadin for atrial fibrillation.  His most recent INR is 2.60.  His goal INR range is 2.0-3.0.  He will need to contact Dr. Thersa Salt office at discharge to set up PT/INR follow up appointment for 48 hours after discharge from CIR.  He has received insurance authorization for CIR.  His incisions are  healing without evidence on infection.  He is ambulating with assistance, but this is limited due to progression of his previous stroke.  He will continue to work with SLP and his diet can be advanced as they recommend.  He is medically stable for discharge to CIR today.             Significant Diagnostic Studies:   Cardiac graphics:   Echocardiogram:  Study Conclusions  - Left ventricle: The cavity size was mildly dilated. Systolic   function was moderately to severely reduced. The estimated   ejection fraction was in the range of 30% to 35%. Diffuse   hypokinesis. Akinesis of the inferolateral myocardium. Akinesis   of the lateral myocardium. - Aortic valve: No evidence of vegetation. There was trivial    regurgitation. - Mitral valve: Mobility of the posterior leaflet was restricted.   No evidence of vegetation. There was severe regurgitation. - Left atrium: The atrium was moderately dilated. No evidence of   thrombus in the atrial cavity or appendage. - Right atrium: No evidence of thrombus in the atrial cavity or   appendage. - Atrial septum: No defect or patent foramen ovale was identified. - Tricuspid valve: No evidence of vegetation. - Pulmonic valve: No evidence of vegetation.  Angiography:   1. Significant multivessel coronary artery disease, including 50-60% distal LMCA lesion, 50% proximal LAD stenosis, subtotal occlusion of mid LCx followed by 70% OM3 stenosis, and chronic total occlusion of mid RCA. 2. Normal left heart, right heart, and pulmonary artery pressures. 3. Normal Fick cardiac output/index. 4. Small right radial artery with high takeoff precluding left heart catheterization and guide catheter advancement to allow for FFR of LMCA/LAD disease. 5. Small right radial artery with high takeoff. Consider alternative access for future catheterizations.  Treatments: surgery:   1.  Coronary artery bypass grafting x4 (left internal mammary artery to left anterior descending, saphenous vein graft to diagonal, saphenous vein graft to circumflex marginal, saphenous vein graft to posterior descending). 2.  Chordal sparing mitral valve replacement using a 29 mm bioprosthetic valve [facility] Ease pericardial valve, serial [id number]). 3.  Application of left atrial clip. 4.  Endoscopic harvest of right greater saphenous vein  Discharge Exam: Blood pressure 108/60, pulse (!) 54, temperature (!) 97.3 F (36.3 C), temperature source Oral, resp. rate 18, height 5' 11.5" (1.816 m), weight 88.5 kg, SpO2 99 %.  General appearance: alert, cooperative and no distress Heart: regular rate and rhythm Lungs: clear to auscultation bilaterally Abdomen: soft, non-tender; bowel sounds normal;  no masses,  no organomegaly Extremities: edema trace Wound: clean and dry   Discharge disposition: [facility] Not Defined      Home  Discharge Medications:  The patient has been discharged on:   1.Beta Blocker:  Yes [ x  ]                              No   [   ]                              If No, reason:  2.Ace Inhibitor/ARB: Yes [ x  ]                                     No  [    ]  If No, reason:  3.Statin:   Yes [ x  ]                  No  [   ]                  If No, reason:  4.Ecasa:  Yes  [x   ]                  No   [   ]                  If No, reason:     Discharge Instructions    Discharge instructions   Complete by:  As directed    Please continue CBGS 3x per day with meals and at bedtime     Allergies as of 02/03/2018      Reactions   Fentanyl Shortness Of Breath   Promethazine Hcl Anaphylaxis, Other (See Comments)   Cardiac arrest   Foeniculum Vulgare Other (See Comments)   Fennel bulbs--nausea only   Metformin And Related Diarrhea   Penicillins Nausea Only   Has patient had a PCN reaction causing immediate rash, facial/tongue/throat swelling, SOB or lightheadedness with hypotension: no Has patient had a PCN reaction causing severe rash involving mucus membranes or skin necrosis: no Has patient had a PCN reaction that required hospitalization: no Has patient had a PCN reaction occurring within the last 10 years: yes If all of the above answers are "NO", then may proceed with Cephalosporin use.   Sulfa Antibiotics Other (See Comments)   G.I. Upset      Medication List    STOP taking these medications   amiodarone 200 MG tablet Commonly known as:  PACERONE   cyclobenzaprine 10 MG tablet Commonly known as:  FLEXERIL   lisinopril 5 MG tablet Commonly known as:  PRINIVIL,ZESTRIL     TAKE these medications   acetaminophen 325 MG tablet Commonly known as:  TYLENOL Take 650 mg by  mouth every 6 (six) hours as needed (for pain.).   aspirin EC 81 MG tablet Take 81 mg by mouth daily.   carvedilol 3.125 MG tablet Commonly known as:  COREG Take 1 tablet (3.125 mg total) by mouth 2 (two) times daily with a meal. What changed:    medication strength  how much to take  when to take this   cephALEXin 500 MG capsule Commonly known as:  KEFLEX Take 1 capsule (500 mg total) by mouth 4 (four) times daily.   feeding supplement (GLUCERNA SHAKE) Liqd Take 237 mLs by mouth 3 (three) times daily between meals.   feeding supplement (PRO-STAT SUGAR FREE 64) Liqd Take 30 mLs by mouth 2 (two) times daily.   glipiZIDE 10 MG 24 hr tablet Commonly known as:  GLUCOTROL XL Take 10 mg by mouth daily with breakfast.   losartan 25 MG tablet Commonly known as:  COZAAR Take 0.5 tablets (12.5 mg total) by mouth at bedtime.   oxyCODONE 5 MG immediate release tablet Commonly known as:  Oxy IR/ROXICODONE Take 1 tablet (5 mg total) by mouth every 4 (four) hours as needed for moderate pain or breakthrough pain.   repaglinide 0.5 MG tablet Commonly known as:  PRANDIN Take 1 tablet by mouth 3 (three) times daily with meals.   simvastatin 40 MG tablet Commonly known as:  ZOCOR Take 0.5 tablets (20 mg total) by mouth at bedtime.   spironolactone 25 MG tablet Commonly known  as:  ALDACTONE Take 0.5 tablets (12.5 mg total) by mouth daily. Start taking on:  02/04/2018   tamsulosin 0.4 MG Caps capsule Commonly known as:  FLOMAX Take 0.4 mg by mouth at bedtime.   warfarin 5 MG tablet Commonly known as:  COUMADIN Take 1 tablet (5 mg total) by mouth daily at 6 PM.      Follow-up Information    [name], Peter, MD Follow up on 03/15/2018.   Specialty:  Cardiothoracic Surgery Why:  Appointment is at 4:30, please get CXR at 4:00 at [facility] located on first floor of our office building Contact information: [address] [place]  [id number] [phone]        Baldo DaubMunley, Brian J, MD Follow up.   Specialty:  Cardiology Why:  Please contact office at discharge from CIR to set up PT/INR follow for 48 hours after discharge Contact information: [address] [place] [id number] [phone]           Signed: Ardelle BallsDonielle M Arthur Speagle 02/03/2018, 3:45 PM

## 2018-01-26 NOTE — Evaluation (Signed)
Physical Therapy Evaluation Patient Details Name: Nathan [name]. MRN: [id number] DOB: 10/01/1945 Today's Date: 01/26/2018   History of Present Illness  72 yo admitted 7/29 for CABGx4 and MVR. PMHx: DM, CAD, CHF, ICM, AFib, CVA, HTN  Clinical Impression  Pt pleasant with decreased attention, safety, memory, strength, transfers, gait and mobility who was admitted with above and demonstrates need for max +2 assist with transfers at this time. Pt will benefit from acute therapy to maximize function, strength, safety and mobility to decrease burden of care. Pt stating he was fully independent PTA without left weakness however chart states residual left weakness and pt with grossly 3/5 LLE strength and grossly 2+/5 LUE strength this session with impaired balance leaning left and lack of deficits. Will continue to follow and recommend lift for nursing for bed<>chair. Pt educated for all sternal precautions with handout provided and was only able to recall one rule end of session.   HR 90 SpO2 97% on 2L  BP 116/65    Follow Up Recommendations SNF;Supervision/Assistance - 24 hour    Equipment Recommendations  Rolling walker with 5" wheels;3in1 (PT)    Recommendations for Other Services OT consult     Precautions / Restrictions Precautions Precautions: Fall;Sternal Precaution Booklet Issued: Yes (comment) Precaution Comments: left weak      Mobility  Bed Mobility Overal bed mobility: Needs Assistance Bed Mobility: Rolling;Sidelying to Sit Rolling: Mod assist Sidelying to sit: Max assist;HOB elevated       General bed mobility comments: cues for sequence with assist to bend left knee and roll to right. Assist to elevate trunk from surface and bring legs off of bed with HOB 35 degrees  Transfers Overall transfer level: Needs assistance   Transfers: Sit to/from Stand;Stand Pivot Transfers Sit to Stand: Max assist;+2 physical assistance;From elevated surface Stand pivot  transfers: Max assist;+2 physical assistance       General transfer comment: max +2 with left knee blocked, cues for hand placement and assist to rise x 2 trials from bed. pt with flexed trunk and left lateral lean in sitting and standing with facilitation. pt with inability to off weight LLE in standing with max assist to shift weight and pivot pt to chair toward right  Ambulation/Gait             General Gait Details: unable  Stairs            Wheelchair Mobility    Modified Rankin (Stroke Patients Only)       Balance Overall balance assessment: Needs assistance   Sitting balance-Leahy Scale: Poor Sitting balance - Comments: flexed trunk with cues for shifting weight right in sitting with min assist for balance Postural control: Left lateral lean;Posterior lean   Standing balance-Leahy Scale: Poor Standing balance comment: bil UE support with use of belt and sacral as well as trunk facilitation. with with anterior left lean in standing                             Pertinent Vitals/Pain Pain Assessment: 0-10 Pain Score: 4  Pain Location: chest Pain Descriptors / Indicators: Aching;Sore Pain Intervention(s): Limited activity within patient's tolerance;Repositioned;Monitored during session    Home Living Family/patient expects to be discharged to:: Private residence Living Arrangements: Spouse/significant other Available Help at Discharge: Family;Available 24 hours/day Type of Home: House Home Access: Level entry     Home Layout: One level Home Equipment: None  Prior Function Level of Independence: Independent         Comments: drives, likes to cook and golf, wife states pt was having memory deficits prior to admission     Hand Dominance        Extremity/Trunk Assessment   Upper Extremity Assessment Upper Extremity Assessment: Generalized weakness    Lower Extremity Assessment Lower Extremity Assessment: LLE  deficits/detail LLE Deficits / Details: pt with decreaseds strength grossly 3/5 not formally assessed    Cervical / Trunk Assessment Cervical / Trunk Assessment: Other exceptions Cervical / Trunk Exceptions: flexed trunk with left lean in sitting and standing  Communication   Communication: HOH  Cognition Arousal/Alertness: Awake/alert Behavior During Therapy: Flat affect Overall Cognitive Status: Impaired/Different from baseline Area of Impairment: Orientation;Memory;Following commands;Safety/judgement                 Orientation Level: Disoriented to;Time   Memory: Decreased short-term memory;Decreased recall of precautions Following Commands: Follows one step commands inconsistently;Follows one step commands with increased time Safety/Judgement: Decreased awareness of deficits;Decreased awareness of safety            General Comments      Exercises     Assessment/Plan    PT Assessment Patient needs continued PT services  PT Problem List Decreased strength;Decreased mobility;Decreased safety awareness;Decreased activity tolerance;Decreased balance;Decreased knowledge of use of DME;Pain;Cardiopulmonary status limiting activity;Decreased knowledge of precautions       PT Treatment Interventions Gait training;Therapeutic exercise;Patient/family education;Balance training;Functional mobility training;Neuromuscular re-education;DME instruction;Therapeutic activities;Cognitive remediation    PT Goals (Current goals can be found in the Care Plan section)  Acute Rehab PT Goals Patient Stated Goal: return to golf and cooking PT Goal Formulation: With patient Time For Goal Achievement: 02/09/18 Potential to Achieve Goals: Fair    Frequency Min 3X/week   Barriers to discharge Decreased caregiver support      Co-evaluation               AM-PAC PT "6 Clicks" Daily Activity  Outcome Measure Difficulty turning over in bed (including adjusting bedclothes, sheets  and blankets)?: Unable Difficulty moving from lying on back to sitting on the side of the bed? : Unable Difficulty sitting down on and standing up from a chair with arms (e.g., wheelchair, bedside commode, etc,.)?: Unable Help needed moving to and from a bed to chair (including a wheelchair)?: A Lot Help needed walking in hospital room?: Total Help needed climbing 3-5 steps with a railing? : Total 6 Click Score: 7    End of Session Equipment Utilized During Treatment: Gait belt Activity Tolerance: Patient tolerated treatment well Patient left: in chair;with call bell/phone within reach;with chair alarm set;with nursing/sitter in room Nurse Communication: Mobility status;Need for lift equipment;Precautions PT Visit Diagnosis: Other abnormalities of gait and mobility (R26.89);Muscle weakness (generalized) (M62.81);Unsteadiness on feet (R26.81);Difficulty in walking, not elsewhere classified (R26.2)    Time: [id number] PT Time Calculation (min) (ACUTE ONLY): 30 min   Charges:   PT Evaluation $PT Eval Moderate Complexity: 1 Mod PT Treatments $Therapeutic Activity: 8-22 mins        Delaney MeigsMaija Tabor Romelle Reiley, PT [phone]   Anida Deol B Payeton Germani 01/26/2018, 9:35 AM

## 2018-01-26 NOTE — Plan of Care (Signed)

## 2018-01-26 NOTE — Progress Notes (Signed)
3 Days Post-Op Procedure(s) (LRB): MITRAL VALVE (MV) REPLACEMENT USING MAGNA MITRAL EASE PERICARDIAL BIOPROSTHESIS, MODEL 7300TFX, SIZE 29 MM, SERIAL # [id number], EXPIRATION  DATE 2021-03-03. (N/A) CORONARY ARTERY BYPASS GRAFTING (CABG) x 4 WITH ENDOSCOPIC HARVESTING OF RIGHT GREATER SAPHENOUS VEIN: LIMA TO LAD, SVG TO RCA, SVG TO DIAG, SVG TO OM  (N/A) TRANSESOPHAGEAL ECHOCARDIOGRAM (TEE) (N/A) LEFT ATRIAL APPENDAGE OCCLUSION USING ATRICURE ATRICLIP PRO2 LAA EXCLUSION SYSTEM  SIZE 40, LOT # [id number], CAT # [id number], EXP. DATE 2020-04-28 (Left) Subjective: Junctional rhythm 60-70 AV paced 90 Good diuresis Epi decreased to 4 mcg for co-ox > 60 Objective: Vital signs in last 24 hours: Temp:  [98.2 F (36.8 C)-99.1 F (37.3 C)] 98.7 F (37.1 C) (08/01 0345) Pulse Rate:  [68-96] 69 (08/01 0715) Cardiac Rhythm: Junctional rhythm (08/01 0100) Resp:  [0-34] 16 (08/01 0715) BP: (62-199)/(39-170) 114/56 (08/01 0715) SpO2:  [89 %-100 %] 98 % (08/01 0715) Arterial Line BP: (105-121)/(41-50) 105/41 (07/31 1000) Weight:  [209 lb 14.1 oz (95.2 kg)] 209 lb 14.1 oz (95.2 kg) (08/01 0615)  Hemodynamic parameters for last 24 hours: CVP:  [5 mmHg-11 mmHg] 5 mmHg  Intake/Output from previous day: 07/31 0701 - 08/01 0700 In: 1903.9 [P.O.:240; I.V.:1049.1; Blood:315; IV Piggyback:299.8] Out: 4490 [Urine:4220; Chest Tube:270] Intake/Output this shift: No intake/output data recorded.       Exam    General- alert and comfortable    Neck- no JVD, no cervical adenopathy palpable, no carotid bruit   Lungs- clear without rales, wheezes   Cor- regular rate and rhythm, no murmur , gallop   Abdomen- soft, non-tender   Extremities - warm, non-tender, minimal edema   Neuro- oriented, appropriate, no focal weakness   Lab Results: Recent Labs    01/25/18 0345 01/25/18 1558 01/26/18 0338  WBC 20.2*  --  15.0*  HGB 8.6* 7.8* 8.7*  HCT 25.5* 23.0* 26.3*  PLT 95*  --  82*   BMET:  Recent Labs     01/25/18 0345 01/25/18 1558 01/26/18 0338  NA 134* 134* 133*  K 4.1 3.9 3.3*  CL 96* 93* 94*  CO2 30  --  30  GLUCOSE 206* 94 174*  BUN 12 15 17   CREATININE 0.99 1.00 1.13  CALCIUM 8.0*  --  7.8*    PT/INR:  Recent Labs    01/23/18 1611  LABPROT 14.7  INR 1.16   ABG    Component Value Date/Time   PHART 7.415 01/24/2018 1538   HCO3 26.4 01/24/2018 1538   TCO2 28 01/25/2018 1558   ACIDBASEDEF 4.0 (H) 01/24/2018 0808   O2SAT 67.2 01/26/2018 0355   CBG (last 3)  Recent Labs    01/25/18 2343 01/26/18 0347 01/26/18 0415  GLUCAP 94 208* 110*    Assessment/Plan: S/P Procedure(s) (LRB): MITRAL VALVE (MV) REPLACEMENT USING MAGNA MITRAL EASE PERICARDIAL BIOPROSTHESIS, MODEL 7300TFX, SIZE 29 MM, SERIAL # [id number], EXPIRATION  DATE 2021-03-03. (N/A) CORONARY ARTERY BYPASS GRAFTING (CABG) x 4 WITH ENDOSCOPIC HARVESTING OF RIGHT GREATER SAPHENOUS VEIN: LIMA TO LAD, SVG TO RCA, SVG TO DIAG, SVG TO OM  (N/A) TRANSESOPHAGEAL ECHOCARDIOGRAM (TEE) (N/A) LEFT ATRIAL APPENDAGE OCCLUSION USING ATRICURE ATRICLIP PRO2 LAA EXCLUSION SYSTEM  SIZE 40, LOT # [id number], CAT # [id number], EXP. DATE 2020-04-28 (Left) Mobilize Diuresis Diabetes control d/c tubes/lines slow  epi wean, 4 mcg today   LOS: 3 days    Kathlee Nationseter Van Trigt III 01/26/2018

## 2018-01-26 NOTE — Clinical Social Work Note (Signed)
Clinical Social Work Assessment  Patient Details  Name: Nathan [name]. MRN: [id number] Date of Birth: Apr 11, 1946  Date of referral:  01/26/18               Reason for consult:  Facility Placement                Permission sought to share information with:  Chartered certified accountant granted to share information::  Yes, Verbal Permission Granted  Name::     Electrical engineer::  SNF  Relationship::  wife  Contact Information:     Housing/Transportation Living arrangements for the past 2 months:  Single Family Home Source of Information:  Patient, Spouse Patient Interpreter Needed:  None Criminal Activity/Legal Involvement Pertinent to Current Situation/Hospitalization:  No - Comment as needed Significant Relationships:  Spouse Lives with:  Spouse Do you feel safe going back to the place where you live?  No Need for family participation in patient care:  Yes (Comment)  Care giving concerns:  Pt lives at home with wife- wife normally works during the day in education but is home over the summer.  Pt now with increased impairment following surgery and wife unable to manage level of  physical needs.   Social Worker assessment / plan:  CSW met with pt at bedside to discuss SNF- pt was alert but unsure how oriented stated he did well with therapy and thinks he could go home despite not being able to walk.  CSW requested permission to speak with wife and pt was agreeable.  CSW spoke with wife regarding SNF- explained SNF and SNF referral process- she is familiar from placing her father previously.  Employment status:  Retired Nurse, adult PT Recommendations:  [place] / Referral to community resources:  [place]  Patient/Family's Response to care:  Pt and wife agreeable to SNF- hopeful for somewhere near home  Patient/Family's Understanding of and Emotional Response to Diagnosis, Current  Treatment, and Prognosis:  Good understanding of pt condition and needs.  Emotional Assessment Appearance:  Appears stated age Attitude/Demeanor/Rapport:    Affect (typically observed):  Appropriate, Pleasant Orientation:  Oriented to Self, Oriented to Place, Oriented to  Time, Oriented to Situation Alcohol / Substance use:  Not Applicable Psych involvement (Current and /or in the community):  No (Comment)  Discharge Needs  Concerns to be addressed:  Care Coordination Readmission within the last 30 days:  No Current discharge risk:  Physical Impairment Barriers to Discharge:  Continued Medical Work up   Jorge Ny, LCSW 01/26/2018, 3:41 PM

## 2018-01-26 NOTE — Discharge Instructions (Signed)

## 2018-01-26 NOTE — [place] FL2 (Addendum)
[place] MEDICAID FL2 LEVEL OF CARE SCREENING TOOL     IDENTIFICATION  Patient Name: Nathan PhilipsRandleman D Shatswell Jr. Birthdate: Jul 26, 1945 Sex: male Admission Date (Current Location): 01/23/2018  [facility] [place]  [employer]   Facility and Address:  [facility], [address], [place], [place] [address]      Provider Number: [id number]  Attending Physician Name and Address:  [name], Peter, MD  Relative Name and Phone Number:       Current Level of Care: Hospital Recommended Level of Care: Skilled Nursing Facility Prior Approval Number:    Date Approved/Denied:   PASRR Number:   [id number]  Discharge Plan: SNF    Current Diagnoses: Patient Active Problem List   Diagnosis Date Noted  . S/P left atrial appendage ligation 01/26/2018  . S/P CABG x 4 01/23/2018  . S/P MVR (mitral valve replacement) 01/23/2018  . Mitral valve insufficiency   . On amiodarone therapy 07/28/2017  . CAD in native artery 07/28/2017  . Chronic anticoagulation 05/18/2017  . LBBB (left bundle branch block) 05/18/2017  . Hospital discharge follow-up 05/09/2017  . Paroxysmal atrial fibrillation (HCC) 05/01/2017  . Dilated cardiomyopathy (HCC) 05/01/2017  . DM (diabetes mellitus) type 2, uncontrolled, with ketoacidosis (HCC) 05/01/2017  . Hypotension 05/01/2017  . HLD (hyperlipidemia) 05/01/2017  . Cerebrovascular accident (CVA) due to embolism of right middle cerebral artery (HCC) 04/29/2017  . Hypertensive heart disease with heart failure (HCC) 11/26/2016  . Mixed dyslipidemia 11/26/2016  . Chronic systolic heart failure (HCC) 04/24/2015  . IVCD (intraventricular conduction defect) 04/24/2015    Orientation RESPIRATION BLADDER Height & Weight     Self, Time, Situation, Place  O2(2L [place]) Incontinent, Indwelling catheter Weight: 209 lb 14.1 oz (95.2 kg) Height:  5' 11.5" (181.6 cm)  BEHAVIORAL SYMPTOMS/MOOD NEUROLOGICAL BOWEL NUTRITION STATUS      Continent Diet   AMBULATORY STATUS COMMUNICATION OF NEEDS Skin   Extensive Assist Verbally Surgical wounds                       Personal Care Assistance Level of Assistance  Bathing, Dressing Bathing Assistance: Maximum assistance   Dressing Assistance: Maximum assistance     Functional Limitations Info             SPECIAL CARE FACTORS FREQUENCY  PT (By licensed PT), OT (By licensed OT)     PT Frequency: 5/wk OT Frequency: 5/wk            Contractures      Additional Factors Info  Code Status, Allergies Code Status Info: FULL Allergies Info: Fentanyl, Promethazine Hcl, Foeniculum Vulgare, Metformin And Related, Penicillins, Sulfa Antibiotics           Current Medications (01/26/2018):  This is the current hospital active medication list Current Facility-Administered Medications  Medication Dose Route Frequency Provider Last Rate Last Dose  . 0.45 % sodium chloride infusion   Intravenous Continuous PRN Barrett, Erin R, PA-C 10 mL/hr at 01/26/18 1500    . acetaminophen (TYLENOL) tablet 1,000 mg  1,000 mg Oral Q6H Barrett, Erin R, PA-C   1,000 mg at 01/26/18 1228   Or  . acetaminophen (TYLENOL) solution 1,000 mg  1,000 mg Per Tube Q6H Barrett, Erin R, PA-C   1,000 mg at 01/23/18 2313  . aspirin EC tablet 325 mg  325 mg Oral Daily Barrett, Erin R, PA-C   325 mg at 01/26/18 [phone]   Or  . aspirin chewable tablet  324 mg  324 mg Per Tube Daily Barrett, Erin R, PA-C      . bisacodyl (DULCOLAX) EC tablet 10 mg  10 mg Oral Daily Barrett, Erin R, PA-C   10 mg at 01/26/18 1610   Or  . bisacodyl (DULCOLAX) suppository 10 mg  10 mg Rectal Daily Barrett, Erin R, PA-C      . carvedilol (COREG) tablet 3.125 mg  3.125 mg Oral BID WC Donata Clay, Theron Arista, MD   3.125 mg at 01/26/18 0841  . Chlorhexidine Gluconate Cloth 2 % PADS 6 each  6 each Topical Daily Kerin Perna, MD   6 each at 01/26/18 1134  . docusate sodium (COLACE) capsule 200 mg  200 mg Oral Daily Barrett, Erin R, PA-C   200 mg at  01/26/18 0938  . enoxaparin (LOVENOX) injection 40 mg  40 mg Subcutaneous Q24H Donata Clay, Theron Arista, MD      . EPINEPHrine (ADRENALIN) 4 mg in dextrose 5 % 250 mL (0.016 mg/mL) infusion  4 mcg/min Intravenous Titrated Donata Clay, Theron Arista, MD 15 mL/hr at 01/26/18 1500 4 mcg/min at 01/26/18 1500  . fentaNYL (SUBLIMAZE) injection 12.5 mcg  12.5 mcg Intravenous Q2H PRN Kerin Perna, MD   12.5 mcg at 01/26/18 1109  . furosemide (LASIX) 250 mg in dextrose 5 % 250 mL (1 mg/mL) infusion  8 mg/hr Intravenous Continuous Donata Clay, Theron Arista, MD 8 mL/hr at 01/26/18 1500 8 mg/hr at 01/26/18 1500  . insulin aspart (novoLOG) injection 0-24 Units  0-24 Units Subcutaneous Q4H Kerin Perna, MD   2 Units at 01/26/18 1246  . insulin aspart (novoLOG) injection 4 Units  4 Units Subcutaneous TID WC Kerin Perna, MD   4 Units at 01/24/18 1722  . insulin detemir (LEVEMIR) injection 24 Units  24 Units Subcutaneous BID Kerin Perna, MD   24 Units at 01/26/18 1133  . metoCLOPramide (REGLAN) injection 10 mg  10 mg Intravenous Q6H Barrett, Erin R, PA-C   10 mg at 01/26/18 1236  . metolazone (ZAROXOLYN) tablet 5 mg  5 mg Oral Daily Donata Clay, Theron Arista, MD   5 mg at 01/26/18 [phone]  . milrinone (PRIMACOR) 20 MG/100 ML (0.2 mg/mL) infusion  0.125 mcg/kg/min Intravenous Continuous Donata Clay, Theron Arista, MD 3.5 mL/hr at 01/26/18 1500 0.125 mcg/kg/min at 01/26/18 1500  . ondansetron (ZOFRAN) injection 4 mg  4 mg Intravenous Q6H PRN Barrett, Erin R, PA-C   4 mg at 01/25/18 0743  . oxyCODONE (Oxy IR/ROXICODONE) immediate release tablet 5 mg  5 mg Oral Q4H PRN Donata Clay, Theron Arista, MD      . pantoprazole (PROTONIX) EC tablet 40 mg  40 mg Oral Daily Barrett, Erin R, PA-C   40 mg at 01/26/18 0939  . pneumococcal 23 valent vaccine (PNU-IMMUNE) injection 0.5 mL  0.5 mL Intramuscular Prior to discharge Donata Clay, Theron Arista, MD      . potassium chloride SA (K-DUR,KLOR-CON) CR tablet 40 mEq  40 mEq Oral Daily Barrett, Erin R, PA-C   40 mEq at 01/26/18 0938   . promethazine (PHENERGAN) injection 12.5 mg  12.5 mg Intravenous Q6H PRN Kerin Perna, MD   12.5 mg at 01/25/18 0940  . simvastatin (ZOCOR) tablet 20 mg  20 mg Oral QHS Barrett, Erin R, PA-C   20 mg at 01/25/18 2154  . sodium chloride flush (NS) 0.9 % injection 10-40 mL  10-40 mL Intracatheter Q12H Donata Clay, Theron Arista, MD   20 mL at 01/26/18 1139  . sodium chloride  flush (NS) 0.9 % injection 10-40 mL  10-40 mL Intracatheter PRN Donata Clay, Theron Arista, MD      . sodium chloride flush (NS) 0.9 % injection 3 mL  3 mL Intravenous Q12H Barrett, Erin R, PA-C   3 mL at 01/25/18 2156  . sodium chloride flush (NS) 0.9 % injection 3 mL  3 mL Intravenous PRN Barrett, Erin R, PA-C      . tamsulosin (FLOMAX) capsule 0.4 mg  0.4 mg Oral QHS Barrett, Erin R, PA-C   0.4 mg at 01/25/18 2155  . traMADol (ULTRAM) tablet 50-100 mg  50-100 mg Oral Q4H PRN Barrett, Erin R, PA-C   50 mg at 01/26/18 5284     Discharge Medications: Please see discharge summary for a list of discharge medications.  Relevant Imaging Results:  Relevant Lab Results:   Additional Information SS#: [id number]  Burna Sis, LCSW

## 2018-01-27 ENCOUNTER — Inpatient Hospital Stay (HOSPITAL_COMMUNITY): Payer: Medicare Other

## 2018-01-27 LAB — GLUCOSE, CAPILLARY
Glucose-Capillary: 141 mg/dL — ABNORMAL HIGH (ref 70–99)
Glucose-Capillary: 168 mg/dL — ABNORMAL HIGH (ref 70–99)
Glucose-Capillary: 178 mg/dL — ABNORMAL HIGH (ref 70–99)
Glucose-Capillary: 191 mg/dL — ABNORMAL HIGH (ref 70–99)
Glucose-Capillary: 73 mg/dL (ref 70–99)
Glucose-Capillary: 95 mg/dL (ref 70–99)

## 2018-01-27 LAB — CBC
HCT: 26.6 % — ABNORMAL LOW (ref 39.0–52.0)
Hemoglobin: 8.8 g/dL — ABNORMAL LOW (ref 13.0–17.0)
MCH: 30.7 pg (ref 26.0–34.0)
MCHC: 33.1 g/dL (ref 30.0–36.0)
MCV: 92.7 fL (ref 78.0–100.0)
Platelets: 122 10*3/uL — ABNORMAL LOW (ref 150–400)
RBC: 2.87 MIL/uL — ABNORMAL LOW (ref 4.22–5.81)
RDW: 14.6 % (ref 11.5–15.5)
WBC: 13.8 10*3/uL — ABNORMAL HIGH (ref 4.0–10.5)

## 2018-01-27 LAB — COMPREHENSIVE METABOLIC PANEL
ALT: 21 U/L (ref 0–44)
AST: 28 U/L (ref 15–41)
Albumin: 2.8 g/dL — ABNORMAL LOW (ref 3.5–5.0)
Alkaline Phosphatase: 60 U/L (ref 38–126)
Anion gap: 11 (ref 5–15)
BUN: 20 mg/dL (ref 8–23)
CO2: 35 mmol/L — ABNORMAL HIGH (ref 22–32)
Calcium: 8.5 mg/dL — ABNORMAL LOW (ref 8.9–10.3)
Chloride: 88 mmol/L — ABNORMAL LOW (ref 98–111)
Creatinine, Ser: 1.1 mg/dL (ref 0.61–1.24)
GFR calc Af Amer: >60 mL/min (ref >60)
GFR calc non Af Amer: >60 mL/min (ref >60)
Glucose, Bld: 105 mg/dL — ABNORMAL HIGH (ref 70–99)
Potassium: 3.4 mmol/L — ABNORMAL LOW (ref 3.5–5.1)
Sodium: 134 mmol/L — ABNORMAL LOW (ref 135–145)
Total Bilirubin: 1.4 mg/dL — ABNORMAL HIGH (ref 0.3–1.2)
Total Protein: 5.2 g/dL — ABNORMAL LOW (ref 6.5–8.1)

## 2018-01-27 LAB — COOXEMETRY PANEL
Carboxyhemoglobin: 1.7 % — ABNORMAL HIGH (ref 0.5–1.5)
Methemoglobin: 1.3 % (ref 0.0–1.5)
O2 Saturation: 59.3 %
Total hemoglobin: 9 g/dL — ABNORMAL LOW (ref 12.0–16.0)

## 2018-01-27 MED ORDER — FUROSEMIDE 10 MG/ML IJ SOLN
40.0000 mg | Freq: Two times a day (BID) | INTRAMUSCULAR | Status: DC
  Administered 2018-01-27 – 2018-01-29 (×5): 40 mg via INTRAVENOUS
  Filled 2018-01-27 (×5): qty 4

## 2018-01-27 MED ORDER — POTASSIUM CHLORIDE CRYS ER 20 MEQ PO TBCR
40.0000 meq | EXTENDED_RELEASE_TABLET | Freq: Two times a day (BID) | ORAL | Status: DC
  Administered 2018-01-27 – 2018-02-01 (×12): 40 meq via ORAL
  Filled 2018-01-27 (×12): qty 2

## 2018-01-27 MED ORDER — INSULIN DETEMIR 100 UNIT/ML ~~LOC~~ SOLN
20.0000 [IU] | Freq: Two times a day (BID) | SUBCUTANEOUS | Status: DC
  Administered 2018-01-27 – 2018-01-28 (×3): 20 [IU] via SUBCUTANEOUS
  Filled 2018-01-27 (×6): qty 0.2

## 2018-01-27 MED ORDER — WARFARIN - PHYSICIAN DOSING INPATIENT
Freq: Every day | Status: DC
  Administered 2018-01-27 – 2018-01-31 (×2)

## 2018-01-27 MED ORDER — WARFARIN SODIUM 2.5 MG PO TABS
2.5000 mg | ORAL_TABLET | Freq: Every day | ORAL | Status: DC
  Administered 2018-01-27 – 2018-01-28 (×2): 2.5 mg via ORAL
  Filled 2018-01-27 (×2): qty 1

## 2018-01-27 MED ORDER — POTASSIUM CHLORIDE 10 MEQ/50ML IV SOLN
10.0000 meq | INTRAVENOUS | Status: AC
  Administered 2018-01-27 (×3): 10 meq via INTRAVENOUS
  Filled 2018-01-27 (×2): qty 50

## 2018-01-27 MED FILL — Sodium Chloride IV Soln 0.9%: INTRAVENOUS | Qty: 2000 | Status: AC

## 2018-01-27 MED FILL — Electrolyte-R (PH 7.4) Solution: INTRAVENOUS | Qty: 4000 | Status: AC

## 2018-01-27 MED FILL — Lidocaine HCl(Cardiac) IV PF Soln Pref Syr 100 MG/5ML (2%): INTRAVENOUS | Qty: 5 | Status: AC

## 2018-01-27 MED FILL — Heparin Sodium (Porcine) Inj 1000 Unit/ML: INTRAMUSCULAR | Qty: 10 | Status: AC

## 2018-01-27 MED FILL — Mannitol IV Soln 20%: INTRAVENOUS | Qty: 500 | Status: AC

## 2018-01-27 MED FILL — Sodium Bicarbonate IV Soln 8.4%: INTRAVENOUS | Qty: 200 | Status: AC

## 2018-01-27 NOTE — Progress Notes (Signed)
CBG-73. Patient encouraged to eat some crackers and milk. Also drank some apple juice.

## 2018-01-27 NOTE — Progress Notes (Addendum)
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 (N/A) CORONARY ARTERY BYPASS GRAFTING (CABG) x 4 WITH ENDOSCOPIC HARVESTING OF RIGHT GREATER SAPHENOUS VEIN: LIMA TO LAD, SVG TO RCA, SVG TO DIAG, SVG TO OM  (N/A) TRANSESOPHAGEAL ECHOCARDIOGRAM (TEE) (N/A) LEFT ATRIAL APPENDAGE OCCLUSION USING ATRICURE ATRICLIP PRO2 LAA EXCLUSION SYSTEM  SIZE 40, LOT # [id number], CAT # [id number], EXP. DATE 2020-04-28 (Left)  Total Length of Stay:  LOS: 4 days   Subjective:  No new complaints.  States he is feeling better today.  Worked with PT yesterday, needs SNF at discharge.  + BM  Objective: Vital signs in last 24 hours: Temp:  [97.6 F (36.4 C)-98.7 F (37.1 C)] 97.7 F (36.5 C) (08/02 1153) Pulse Rate:  [79-92] 89 (08/02 1200) Cardiac Rhythm: A-V Sequential paced (08/01 2000) Resp:  [11-23] 11 (08/02 1200) BP: (81-122)/(44-84) 81/61 (08/02 1200) SpO2:  [88 %-99 %] 95 % (08/02 1200) Weight:  [206 lb 9.1 oz (93.7 kg)] 206 lb 9.1 oz (93.7 kg) (08/02 0410)  Filed Weights   01/24/18 0515 01/26/18 0615 01/27/18 0410  Weight: 227 lb 8.2 oz (103.2 kg) 209 lb 14.1 oz (95.2 kg) 206 lb 9.1 oz (93.7 kg)    Weight change: -3 lb 4.9 oz (-1.5 kg)   Hemodynamic parameters for last 24 hours: CVP:  [8 mmHg-13 mmHg] 11 mmHg  Intake/Output from previous day: 08/01 0701 - 08/02 0700 In: 1558.6 [P.O.:360; I.V.:688.6; IV Piggyback:510] Out: [id number] [Urine:3775; Chest Tube:100]  Intake/Output this shift: Total I/O In: 73.5 [I.V.:73.5] Out: 670 [Urine:670]  Current Meds: Scheduled Meds: . acetaminophen  1,000 mg Oral Q6H   Or  . acetaminophen (TYLENOL) oral liquid 160 mg/5 mL  1,000 mg Per Tube Q6H  . aspirin EC  325 mg Oral Daily   Or    . aspirin  324 mg Per Tube Daily  . bisacodyl  10 mg Oral Daily   Or  . bisacodyl  10 mg Rectal Daily  . carvedilol  3.125 mg Oral BID WC  . Chlorhexidine Gluconate Cloth  6 each Topical Daily  . docusate sodium  200 mg Oral Daily  . enoxaparin (LOVENOX) injection  40 mg Subcutaneous Q24H  . furosemide  40 mg Intravenous BID  . insulin aspart  0-24 Units Subcutaneous Q4H  . insulin aspart  4 Units Subcutaneous TID WC  . insulin detemir  20 Units Subcutaneous BID  . metoCLOPramide (REGLAN) injection  10 mg Intravenous Q6H  . metolazone  5 mg Oral Daily  . pantoprazole  40 mg Oral Daily  . potassium chloride  40 mEq Oral BID  . simvastatin  20 mg Oral QHS  . sodium chloride flush  10-40 mL Intracatheter Q12H  . sodium chloride flush  3 mL Intravenous Q12H  . tamsulosin  0.4 mg Oral QHS   Continuous Infusions: . sodium chloride 10 mL/hr at 01/27/18 0000  . EPINEPHrine 4 mg in dextrose 5% 250 mL infusion (16 mcg/mL) 4 mcg/min (01/27/18 1100)  . milrinone 0.125 mcg/kg/min (01/27/18 1100)   PRN Meds:.sodium chloride, fentaNYL (SUBLIMAZE) injection, ondansetron (ZOFRAN) IV,  oxyCODONE, pneumococcal 23 valent vaccine, promethazine, sodium chloride flush, sodium chloride flush, traMADol  General appearance: alert, cooperative and no distress Heart: regular rate and rhythm Lungs: clear to auscultation bilaterally Abdomen: soft, non-tender; bowel sounds normal; no masses,  no organomegaly Extremities: edema trace to 1+, improving Wound: clean and dry  Lab Results: CBC: Recent Labs    01/26/18 0338 01/26/18 1617 01/27/18 0415  WBC 15.0*  --  13.8*  HGB 8.7* 9.2* 8.8*  HCT 26.3* 27.0* 26.6*  PLT 82*  --  122*   BMET:  Recent Labs    01/26/18 0338 01/26/18 1617 01/27/18 0415  NA 133* 134* 134*  K 3.3* 3.2* 3.4*  CL 94* 87* 88*  CO2 30  --  35*  GLUCOSE 174* 156* 105*  BUN 17 19 20   CREATININE 1.13 1.00 1.10  CALCIUM 7.8*  --  8.5*    CMET: Lab Results  Component  Value Date   WBC 13.8 (H) 01/27/2018   HGB 8.8 (L) 01/27/2018   HCT 26.6 (L) 01/27/2018   PLT 122 (L) 01/27/2018   GLUCOSE 105 (H) 01/27/2018   CHOL 136 07/28/2017   TRIG 101 07/28/2017   HDL 34 (L) 07/28/2017   LDLCALC 82 07/28/2017   ALT 21 01/27/2018   AST 28 01/27/2018   NA 134 (L) 01/27/2018   K 3.4 (L) 01/27/2018   CL 88 (L) 01/27/2018   CREATININE 1.10 01/27/2018   BUN 20 01/27/2018   CO2 35 (H) 01/27/2018   TSH 2.140 07/28/2017   INR 1.16 01/23/2018   HGBA1C 5.9 (H) 01/19/2018      PT/INR: No results for input(s): LABPROT, INR in the last 72 hours. Radiology: Dg Chest Port 1 View  Result Date: 01/27/2018 CLINICAL DATA:  Chest soreness and shortness of breath following CABG and mitral valve replacement. EXAM: PORTABLE CHEST 1 VIEW COMPARISON:  01/26/2018 FINDINGS: Left chest tube has been removed. No pneumothorax. Right arm PICC tip in the SVC 4 cm above the right atrium. Heart and mediastinal shadows are unchanged. Venous hypertension and mild interstitial edema persists. Volume loss in the left lower lobe, possibly slightly worsened. IMPRESSION: No pneumothorax following left chest tube removal. Slightly worsened volume loss in the left lower lobe. Persistent mild edema pattern. Electronically Signed   By: Paulina Fusi M.D.   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 (N/A) CORONARY ARTERY BYPASS GRAFTING (CABG) x 4 WITH ENDOSCOPIC HARVESTING OF RIGHT GREATER SAPHENOUS VEIN: LIMA TO LAD, SVG TO RCA, SVG TO DIAG, SVG TO OM  (N/A) TRANSESOPHAGEAL ECHOCARDIOGRAM (TEE) (N/A) LEFT ATRIAL APPENDAGE OCCLUSION USING ATRICURE ATRICLIP PRO2 LAA EXCLUSION SYSTEM  SIZE 40, LOT # [id number], CAT # [id number], EXP. DATE 2020-04-28 (Left)  1. CV-remains in junctional rhythm- weaning Epinephrine, Milrinone as  tolerated 2. Pulm- no acute issues, CXR is free from pneumothorax, continue pulmonary edema 3. Renal- creatinine remains stable at 1.10, K is low at 3.4- have stopped lasix drip, started IV Lasix and Metolazone, will increase potassium supplementation 4. Expected post operative blood loss anemia, mild hgb at 8.8 5. Expected post operative thrombocytopenia, improved to 122 6. DM- episodes of hypoglycemia, insulin regimen adjusted 7. Dispo- patient stable, feels better today, remains in junctional rhythm, weaning Epi, milrinone as tolerated. Insulin adjusted due to hypoglycemia, supplement K, continue diuretics   Erin Barrett 01/27/2018 1:33 PM  Transition to BID iv lasix dosing for interstial eema on cxr Co-ox 60% - drop epi to 3 mcg/minstill not able to walk due to general weakness. PT following. Will start coumadin slowly- doesnot appear to need a pacemaker  patient examined and medical record reviewed,agree with above note. Kathlee Nations Trigt III 01/27/2018

## 2018-01-27 NOTE — Progress Notes (Signed)
Patient ID: Nathan Philipsandleman D Camera Jr., male   DOB: [birth year], 72 y.o.   MRN: [id number] [facility] Evening Rounds:  Hemodynamically stable in paced rhythm.  CVP 9 on epi 3, milrinone 0.125  Urine output ok.

## 2018-01-28 ENCOUNTER — Inpatient Hospital Stay (HOSPITAL_COMMUNITY): Payer: Medicare Other

## 2018-01-28 LAB — COMPREHENSIVE METABOLIC PANEL
ALT: 18 U/L (ref 0–44)
AST: 27 U/L (ref 15–41)
Albumin: 2.5 g/dL — ABNORMAL LOW (ref 3.5–5.0)
Alkaline Phosphatase: 55 U/L (ref 38–126)
Anion gap: 10 (ref 5–15)
BUN: 23 mg/dL (ref 8–23)
CO2: 35 mmol/L — ABNORMAL HIGH (ref 22–32)
Calcium: 8.4 mg/dL — ABNORMAL LOW (ref 8.9–10.3)
Chloride: 87 mmol/L — ABNORMAL LOW (ref 98–111)
Creatinine, Ser: 0.99 mg/dL (ref 0.61–1.24)
GFR calc Af Amer: >60 mL/min (ref >60)
GFR calc non Af Amer: >60 mL/min (ref >60)
Glucose, Bld: 159 mg/dL — ABNORMAL HIGH (ref 70–99)
Potassium: 3.3 mmol/L — ABNORMAL LOW (ref 3.5–5.1)
Sodium: 132 mmol/L — ABNORMAL LOW (ref 135–145)
Total Bilirubin: 1.3 mg/dL — ABNORMAL HIGH (ref 0.3–1.2)
Total Protein: 5.1 g/dL — ABNORMAL LOW (ref 6.5–8.1)

## 2018-01-28 LAB — COOXEMETRY PANEL
Carboxyhemoglobin: 1.6 % — ABNORMAL HIGH (ref 0.5–1.5)
Methemoglobin: 1.2 % (ref 0.0–1.5)
O2 Saturation: 58.8 %
Total hemoglobin: 17.3 g/dL — ABNORMAL HIGH (ref 12.0–16.0)

## 2018-01-28 LAB — GLUCOSE, CAPILLARY
Glucose-Capillary: 104 mg/dL — ABNORMAL HIGH (ref 70–99)
Glucose-Capillary: 154 mg/dL — ABNORMAL HIGH (ref 70–99)
Glucose-Capillary: 162 mg/dL — ABNORMAL HIGH (ref 70–99)
Glucose-Capillary: 166 mg/dL — ABNORMAL HIGH (ref 70–99)
Glucose-Capillary: 48 mg/dL — ABNORMAL LOW (ref 70–99)
Glucose-Capillary: 63 mg/dL — ABNORMAL LOW (ref 70–99)
Glucose-Capillary: 65 mg/dL — ABNORMAL LOW (ref 70–99)

## 2018-01-28 LAB — CBC
HCT: 25.6 % — ABNORMAL LOW (ref 39.0–52.0)
Hemoglobin: 8.6 g/dL — ABNORMAL LOW (ref 13.0–17.0)
MCH: 31.2 pg (ref 26.0–34.0)
MCHC: 33.6 g/dL (ref 30.0–36.0)
MCV: 92.8 fL (ref 78.0–100.0)
Platelets: 150 10*3/uL (ref 150–400)
RBC: 2.76 MIL/uL — ABNORMAL LOW (ref 4.22–5.81)
RDW: 14 % (ref 11.5–15.5)
WBC: 10.1 10*3/uL (ref 4.0–10.5)

## 2018-01-28 LAB — PROTIME-INR
INR: 1.24
Prothrombin Time: 15.5 seconds — ABNORMAL HIGH (ref 11.4–15.2)

## 2018-01-28 MED ORDER — DEXTROSE 50 % IV SOLN
INTRAVENOUS | Status: AC
  Administered 2018-01-28: 02:00:00
  Filled 2018-01-28: qty 50

## 2018-01-28 MED ORDER — ORAL CARE MOUTH RINSE
15.0000 mL | Freq: Two times a day (BID) | OROMUCOSAL | Status: DC
  Administered 2018-01-28 – 2018-02-03 (×10): 15 mL via OROMUCOSAL

## 2018-01-28 MED ORDER — LACTATED RINGERS IV SOLN
INTRAVENOUS | Status: DC
  Administered 2018-01-28 – 2018-01-29 (×3): via INTRAVENOUS

## 2018-01-28 MED ORDER — GUAIFENESIN ER 600 MG PO TB12
1200.0000 mg | ORAL_TABLET | Freq: Two times a day (BID) | ORAL | Status: DC
  Administered 2018-01-28 – 2018-02-03 (×13): 1200 mg via ORAL
  Filled 2018-01-28 (×13): qty 2

## 2018-01-28 NOTE — Progress Notes (Signed)
Patient ID: Nathan Philipsandleman D Riling Jr., male   DOB: 1946-06-03, 72 y.o.   MRN: [id number] [facility] Evening Rounds:  Hemodynamically stable, A-paced 80  sats 96%  Good diuresis today.  Able to stand up with assistance. Seems to be moving left arm and leg well.

## 2018-01-28 NOTE — Progress Notes (Addendum)
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 (N/A) CORONARY ARTERY BYPASS GRAFTING (CABG) x 4 WITH ENDOSCOPIC HARVESTING OF RIGHT GREATER SAPHENOUS VEIN: LIMA TO LAD, SVG TO RCA, SVG TO DIAG, SVG TO OM  (N/A) TRANSESOPHAGEAL ECHOCARDIOGRAM (TEE) (N/A) LEFT ATRIAL APPENDAGE OCCLUSION USING ATRICURE ATRICLIP PRO2 LAA EXCLUSION SYSTEM  SIZE 40, LOT # [id number], CAT # [id number], EXP. DATE 2020-04-28 (Left) Subjective: No specific complaints  Remains very weak overall and requiring lift to get up to chair  Objective: Vital signs in last 24 hours: Temp:  [97.8 F (36.6 C)-98.4 F (36.9 C)] 98.2 F (36.8 C) (08/03 1159) Pulse Rate:  [88-90] 88 (08/03 1200) Cardiac Rhythm: A-V Sequential paced (08/03 0730) Resp:  [13-22] 22 (08/03 1200) BP: (64-119)/(52-89) 104/67 (08/03 1200) SpO2:  [92 %-98 %] 93 % (08/03 1200) Weight:  [93.7 kg (206 lb 9.1 oz)] 93.7 kg (206 lb 9.1 oz) (08/03 0433)  Hemodynamic parameters for last 24 hours: CVP:  [4 mmHg-9 mmHg] 4 mmHg  Intake/Output from previous day: 08/02 0701 - 08/03 0700 In: 3243.6 [I.V.:423.6] Out: 670 [Urine:670] Intake/Output this shift: Total I/O In: 208.1 [I.V.:208.1] Out: -   General appearance: he acts lethargic but now is eating lunch with his daughter and is alert and eating well. Neurologic: left sided weakness Heart: regular rate and rhythm, S1, S2 normal, no murmur, click, rub or gallop Lungs: diminished breath sounds LLL Extremities: edema mild Wound: incisions ok  Lab Results: Recent Labs    01/27/18 0415 01/28/18 0425  WBC 13.8* 10.1  HGB 8.8* 8.6*  HCT 26.6* 25.6*  PLT 122* 150   BMET:  Recent Labs    01/27/18 0415 01/28/18 0425  NA 134* 132*  K 3.4* 3.3*  CL 88* 87*  CO2 35* 35*  GLUCOSE 105* 159*  BUN 20 23  CREATININE 1.10 0.99  CALCIUM 8.5* 8.4*    PT/INR:   Recent Labs    01/28/18 0425  LABPROT 15.5*  INR 1.24   ABG    Component Value Date/Time   PHART 7.415 01/24/2018 1538   HCO3 26.4 01/24/2018 1538   TCO2 29 01/26/2018 1617   ACIDBASEDEF 4.0 (H) 01/24/2018 0808   O2SAT 58.8 01/28/2018 0500   CBG (last 3)  Recent Labs    01/28/18 0357 01/28/18 0733 01/28/18 1156  GLUCAP 162* 166* 154*    Assessment/Plan: S/P Procedure(s) (LRB): 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 (N/A) CORONARY ARTERY BYPASS GRAFTING (CABG) x 4 WITH ENDOSCOPIC HARVESTING OF RIGHT GREATER SAPHENOUS VEIN: LIMA TO LAD, SVG TO RCA, SVG TO DIAG, SVG TO OM  (N/A) TRANSESOPHAGEAL ECHOCARDIOGRAM (TEE) (N/A) LEFT ATRIAL APPENDAGE OCCLUSION USING ATRICURE ATRICLIP PRO2 LAA EXCLUSION SYSTEM  SIZE 40, LOT # [id number], CAT # [id number], EXP. DATE 2020-04-28 (Left)  Hemodynamically stable on milrinone 0.125 and epi 3 with Co-ox 58.8 and CVP 4. BP was in the 90's overnight so will continue inotropes at current level for now. Rhythm is sinus 60's so will atrial pace at 80 to maximize CO. CXR shows pulmonary edema and left base atelectasis. CVP is low and he does not look fluid overloaded. Continues on diuretics. Weight is almost at preop.  Limiting factor is weakness and immobility. 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 According to wife he had made a good recovery from that event in November last year and was fully functional and ambulatory preop. He may have had some further ischemia in this region periop. Continue PT. I suspect that some of his immobility now is due to lack of trying.   Encourage nutrition, flutter valve, IS.   LOS: 5 days    Alleen Borne 01/28/2018

## 2018-01-28 NOTE — Progress Notes (Signed)
RT NOTES: Called to patient's room due to patient c/o having trouble breathing. Patient in no apparent distress. Sats 96%, no labored breathing or accessory muscle use. Patient states he's hurting and it hurts to breathe and feels like he needs pain meds and to be repositioned. Notified RN.

## 2018-01-29 ENCOUNTER — Inpatient Hospital Stay (HOSPITAL_COMMUNITY): Payer: Medicare Other

## 2018-01-29 LAB — COMPREHENSIVE METABOLIC PANEL
ALT: 17 U/L (ref 0–44)
AST: 23 U/L (ref 15–41)
Albumin: 2.7 g/dL — ABNORMAL LOW (ref 3.5–5.0)
Alkaline Phosphatase: 65 U/L (ref 38–126)
Anion gap: 13 (ref 5–15)
BUN: 19 mg/dL (ref 8–23)
CO2: 36 mmol/L — ABNORMAL HIGH (ref 22–32)
Calcium: 8.5 mg/dL — ABNORMAL LOW (ref 8.9–10.3)
Chloride: 86 mmol/L — ABNORMAL LOW (ref 98–111)
Creatinine, Ser: 0.9 mg/dL (ref 0.61–1.24)
GFR calc Af Amer: >60 mL/min (ref >60)
GFR calc non Af Amer: >60 mL/min (ref >60)
Glucose, Bld: 68 mg/dL — ABNORMAL LOW (ref 70–99)
Potassium: 3.2 mmol/L — ABNORMAL LOW (ref 3.5–5.1)
Sodium: 135 mmol/L (ref 135–145)
Total Bilirubin: 1.3 mg/dL — ABNORMAL HIGH (ref 0.3–1.2)
Total Protein: 6 g/dL — ABNORMAL LOW (ref 6.5–8.1)

## 2018-01-29 LAB — GLUCOSE, CAPILLARY
Glucose-Capillary: 100 mg/dL — ABNORMAL HIGH (ref 70–99)
Glucose-Capillary: 147 mg/dL — ABNORMAL HIGH (ref 70–99)
Glucose-Capillary: 237 mg/dL — ABNORMAL HIGH (ref 70–99)
Glucose-Capillary: 57 mg/dL — ABNORMAL LOW (ref 70–99)
Glucose-Capillary: 85 mg/dL (ref 70–99)
Glucose-Capillary: 94 mg/dL (ref 70–99)

## 2018-01-29 LAB — CBC
HCT: 27.5 % — ABNORMAL LOW (ref 39.0–52.0)
Hemoglobin: 9 g/dL — ABNORMAL LOW (ref 13.0–17.0)
MCH: 30.5 pg (ref 26.0–34.0)
MCHC: 32.7 g/dL (ref 30.0–36.0)
MCV: 93.2 fL (ref 78.0–100.0)
Platelets: 176 10*3/uL (ref 150–400)
RBC: 2.95 MIL/uL — ABNORMAL LOW (ref 4.22–5.81)
RDW: 13.6 % (ref 11.5–15.5)
WBC: 9.6 10*3/uL (ref 4.0–10.5)

## 2018-01-29 LAB — COOXEMETRY PANEL
Carboxyhemoglobin: 1.3 % (ref 0.5–1.5)
Methemoglobin: 1.6 % — ABNORMAL HIGH (ref 0.0–1.5)
O2 Saturation: 64.1 %
Total hemoglobin: 9.9 g/dL — ABNORMAL LOW (ref 12.0–16.0)

## 2018-01-29 LAB — PROTIME-INR
INR: 1.2
Prothrombin Time: 15.1 seconds (ref 11.4–15.2)

## 2018-01-29 MED ORDER — INSULIN ASPART 100 UNIT/ML ~~LOC~~ SOLN
0.0000 [IU] | Freq: Three times a day (TID) | SUBCUTANEOUS | Status: DC
  Administered 2018-01-29: 8 [IU] via SUBCUTANEOUS
  Administered 2018-01-30 – 2018-01-31 (×5): 2 [IU] via SUBCUTANEOUS
  Administered 2018-02-01: 4 [IU] via SUBCUTANEOUS
  Administered 2018-02-01 – 2018-02-02 (×5): 2 [IU] via SUBCUTANEOUS
  Administered 2018-02-03: 8 [IU] via SUBCUTANEOUS
  Administered 2018-02-03: 2 [IU] via SUBCUTANEOUS

## 2018-01-29 MED ORDER — INSULIN DETEMIR 100 UNIT/ML ~~LOC~~ SOLN
15.0000 [IU] | Freq: Every day | SUBCUTANEOUS | Status: DC
  Administered 2018-01-30 – 2018-02-03 (×5): 15 [IU] via SUBCUTANEOUS
  Filled 2018-01-29 (×5): qty 0.15

## 2018-01-29 MED ORDER — WARFARIN SODIUM 5 MG PO TABS
5.0000 mg | ORAL_TABLET | Freq: Once | ORAL | Status: AC
  Administered 2018-01-29: 5 mg via ORAL
  Filled 2018-01-29: qty 1

## 2018-01-29 MED ORDER — FUROSEMIDE 10 MG/ML IJ SOLN: 40.0000 mg | Freq: Every day | INTRAMUSCULAR | Status: DC

## 2018-01-29 MED ORDER — DOCUSATE SODIUM 100 MG PO CAPS
200.0000 mg | ORAL_CAPSULE | Freq: Every day | ORAL | Status: DC | PRN
  Administered 2018-02-02: 200 mg via ORAL
  Filled 2018-01-29: qty 2

## 2018-01-29 MED ORDER — TRAMADOL HCL 50 MG PO TABS
50.0000 mg | ORAL_TABLET | Freq: Four times a day (QID) | ORAL | Status: DC | PRN
  Administered 2018-01-30 – 2018-02-01 (×4): 50 mg via ORAL
  Filled 2018-01-29 (×4): qty 1

## 2018-01-29 MED ORDER — PATIENT'S GUIDE TO USING COUMADIN BOOK
Freq: Once | Status: AC
  Administered 2018-01-29: 18:00:00
  Filled 2018-01-29: qty 1

## 2018-01-29 MED ORDER — POTASSIUM CHLORIDE 10 MEQ/50ML IV SOLN
10.0000 meq | INTRAVENOUS | Status: AC | PRN
  Administered 2018-01-29 (×3): 10 meq via INTRAVENOUS
  Filled 2018-01-29 (×3): qty 50

## 2018-01-29 NOTE — Progress Notes (Signed)
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 (N/A) CORONARY ARTERY BYPASS GRAFTING (CABG) x 4 WITH ENDOSCOPIC HARVESTING OF RIGHT GREATER SAPHENOUS VEIN: LIMA TO LAD, SVG TO RCA, SVG TO DIAG, SVG TO OM  (N/A) TRANSESOPHAGEAL ECHOCARDIOGRAM (TEE) (N/A) LEFT ATRIAL APPENDAGE OCCLUSION USING ATRICURE ATRICLIP PRO2 LAA EXCLUSION SYSTEM  SIZE 40, LOT # [id number], CAT # [id number], EXP. DATE 2020-04-28 (Left) Subjective:  No specific complaints  Objective: Vital signs in last 24 hours: Temp:  [97.6 F (36.4 C)-98.5 F (36.9 C)] 98.5 F (36.9 C) (08/04 1305) Pulse Rate:  [60-81] 60 (08/04 1200) Cardiac Rhythm: Atrial paced (08/04 1230) Resp:  [12-23] 17 (08/04 1200) BP: (82-119)/(38-97) 104/49 (08/04 1200) SpO2:  [85 %-100 %] 98 % (08/04 1200) Weight:  [95.8 kg (211 lb 3.2 oz)] 95.8 kg (211 lb 3.2 oz) (08/04 0400)  Hemodynamic parameters for last 24 hours: CVP:  [4 mmHg-10 mmHg] 7 mmHg  Intake/Output from previous day: 08/03 0701 - 08/04 0700 In: 547.9 [I.V.:547.9] Out: 2350 [Urine:2350] Intake/Output this shift: Total I/O In: 698.4 [P.O.:400; I.V.:148.4; IV Piggyback:150] Out: -   General appearance: alert and cooperative Neurologic: mild left sided weakness. Heart: irregularly irregular rhythm Lungs: clear to auscultation bilaterally Extremities: edema mild Wound: incision ok  Lab Results: Recent Labs    01/28/18 0425 01/29/18 0445  WBC 10.1 9.6  HGB 8.6* 9.0*  HCT 25.6* 27.5*  PLT 150 176   BMET:  Recent Labs    01/28/18 0425 01/29/18 0445  NA 132* 135  K 3.3* 3.2*  CL 87* 86*  CO2 35* 36*  GLUCOSE 159* 68*  BUN 23 19  CREATININE 0.99 0.90  CALCIUM 8.4* 8.5*    PT/INR:  Recent Labs    01/29/18 0445  LABPROT 15.1  INR 1.20   ABG    Component Value Date/Time   PHART 7.415 01/24/2018 1538   HCO3 26.4 01/24/2018  1538   TCO2 29 01/26/2018 1617   ACIDBASEDEF 4.0 (H) 01/24/2018 0808   O2SAT 64.1 01/29/2018 0615   CBG (last 3)  Recent Labs    01/29/18 0358 01/29/18 0745 01/29/18 1302  GLUCAP 57* 100* 147*   CLINICAL DATA:  Postop from CABG and mitral valve replacement.  EXAM: PORTABLE CHEST 1 VIEW  COMPARISON:  01/28/2018  FINDINGS: Stable cardiomegaly. Previous CABG and mitral valve replacement. Right arm PICC line remains in appropriate position. No pneumothorax visualized.  Diffuse interstitial infiltrates show no significant change, consistent with pulmonary edema. No significant pleural effusion or pulmonary consolidation.  IMPRESSION: Stable cardiomegaly and diffuse pulmonary edema.   Electronically Signed   By: Myles RosenthalJohn  Stahl M.D.   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 (N/A) CORONARY ARTERY BYPASS GRAFTING (CABG) x 4 WITH ENDOSCOPIC HARVESTING OF RIGHT GREATER SAPHENOUS VEIN: LIMA TO LAD, SVG TO RCA, SVG TO DIAG, SVG TO OM  (N/A) TRANSESOPHAGEAL ECHOCARDIOGRAM (TEE) (N/A) LEFT ATRIAL APPENDAGE OCCLUSION USING ATRICURE ATRICLIP PRO2 LAA EXCLUSION SYSTEM  SIZE 40, LOT # [id number], CAT # [id number], EXP. DATE 2020-04-28 (Left)  He is hemodynamically stable. Rhythm is atrial fib with slow vent response in high 50's to 60. Remains on milrinone 0.125 and epi 2. Co-ox is 64 this am. Will decrease epi to 1 mcg. Stop Coreg with slow vent rate.  Volume excess: diuresed well yesterday but weight increased 5 lbs so not accurate. Continue lasix and K+.  DM: some hypoglycemia yesterday. Will DC meal coverage and change Levemir to 15 units in the morning only. CBG's to AC/HS  Dysphagia: he had a MBS today by ST and put on dys 1 puree with thin liquids. He was on a puree diet at home due to no teeth.  Continue  IS, OOB  PT has not seen him this weekend so hopefully tomorrow.      LOS: 6 days    Alleen Borne 01/29/2018

## 2018-01-29 NOTE — Progress Notes (Signed)
Modified Barium Swallow Progress Note  Patient Details  Name: Nathan PhilipsRandleman D Argueta Jr. MRN: [id number] Date of Birth: 1946-03-26  Today's Date: 01/29/2018  Modified Barium Swallow completed.  Full report located under Chart Review in the Imaging Section.  Brief recommendations include the following:  Clinical Impression  Patient presents with moderate oral and mild pharyngeal dysphagia, with trace silent aspiration of thin liquid x1 due to premature spillage, decreased airway/laryngeal closure. Oral stage characterized by impaired mastication and decreased anterior to posterior transit, which pt attributes to recent tooth extraction. Pt reporting, "I can't swallow" with soft solid, despite having already transferred bolus to base of tongue/valleculae and was eventually able to initiate a swallow with encouragement; minimal base of tongue residue clears with liquid wash. Decreased bolus cohesion with thin/barium tablet; pt retained tablet on lingual surface and ultimately achieved transit with puree. There is discoordination with larger sips, resulting in premature spillage. Pharyngeal stage characterized by reduced epiglottic deflection due to structure of the cervical spine and decreased airway/laryngeal closure. With larger straw sip of thin liquids, there was single instance of penetration and trace aspiration before the swallow. No penetration/aspiration with smaller cup sips or with straw sips with chin tuck. Potential for aspiration events given decreased laryngeal closure and discoordination/premature spillage with larger sips. Would continue dys 1, thin liquids but with no straws, small cup sips only. Meds whole in puree or may crush if pt has difficulty with oral transit. Pt likely to require assistance for feeding. SLP will follow up.    Swallow Evaluation Recommendations       SLP Diet Recommendations: Dysphagia 1 (Puree) solids;Thin liquid   Liquid Administration via: Cup;No straw   Medication Administration: Whole meds with puree   Supervision: Staff to assist with self feeding   Compensations: Slow rate;Small sips/bites       Oral Care Recommendations: Oral care BID     Rondel BatonMary Beth Marisabel Macpherson, MS, CCC-SLP Speech-Language Pathologist [phone]   Nathan [name] 01/29/2018,1:33 PM

## 2018-01-29 NOTE — Progress Notes (Signed)
Patient ID: Nathan Philipsandleman D Welp Jr., male   DOB: 12-14-1945, 72 y.o.   MRN: [id number] T[facility] Evening Rounds  Hemodynamically stable on epi 1, milrinone 0.125  AF with rate 60's  CVP 10  Stable day

## 2018-01-29 NOTE — Progress Notes (Signed)
Inpatient Diabetes Program Recommendations  AACE/ADA: New Consensus Statement on Inpatient Glycemic Control (2019)  Target Ranges:  Prepandial:   less than 140 mg/dL      Peak postprandial:   less than 180 mg/dL (1-2 hours)      Critically ill patients:  140 - 180 mg/dL   Results for Nathan [name], Nathan D JR. (MRN [id number]) as of 01/29/2018 09:21  Ref. Range 01/28/2018 07:33 01/28/2018 11:56 01/28/2018 15:38 01/28/2018 20:29 01/29/2018 00:21 01/29/2018 03:58 01/29/2018 07:45  Glucose-Capillary Latest Ref Range: 70 - 99 mg/dL 409166 (H)  Novolog 4 units  Levemir 20 units 154 (H)  Novolog 2 units 104 (H) 65 (L) 85 57 (L) 100 (H)   Results for Nathan [name], Nathan D JR. (MRN [id number]) as of 01/29/2018 09:21  Ref. Range 01/19/2018 14:59  Hemoglobin A1C Latest Ref Range: 4.8 - 5.6 % 5.9 (H)   Review of Glycemic Control  Diabetes history: DM2 Outpatient Diabetes medications: Glipizide XL 10 mg QAM, Prandin 0.5 mg TID with meals Current orders for Inpatient glycemic control: Levemir 20 units BID, Novolog 0-24 units Q4H, Novolog 4 units TID with meals for meal coverage  Inpatient Diabetes Program Recommendations:  Insulin - Basal: CBG down to 65 mg/dl on 8/3 and CBG 57 mg/dl at 9:563:58 today. Please decrease Levemir to 10 units BID. Insulin-Meal Coverage: May want to consider discontinuing Novolog 4 units TID with meals.  NOTE: Noted consult for Diabetes Coordinator due to hypoglycemia. Diabetes Coordinator is not on campus over the weekend but available by pager from 8am to 5pm for questions or concerns. Chart reviewed.   In reviewing chart, noted patient has experienced hypoglycemia 2 times over the past 24 hours. Patient did NOT receive bedtime dose of Levemir last night and glucose still down to 57 mg/dl today.  Thanks, Orlando PennerMarie Kamber Vignola, RN, MSN, CDE Diabetes Coordinator Inpatient Diabetes Program [phone] (Team Pager from 8am to 5pm)

## 2018-01-29 NOTE — Evaluation (Signed)
Clinical/Bedside Swallow Evaluation Patient Details  Name: Nathan [name]. MRN: [id number] Date of Birth: [birth year]  Today's Date: 01/29/2018 Time: SLP Start Time (ACUTE ONLY): 0920 SLP Stop Time (ACUTE ONLY): 0945 SLP Time Calculation (min) (ACUTE ONLY): 25 min  Past Medical History:  Past Medical History:  Diagnosis Date  . Atrial fibrillation with RVR (HCC) 05/01/2017  . Cardiomyopathy (HCC) 05/01/2017  . CHF (congestive heart failure) (HCC)   . 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 Coronary artery disease   . DM (diabetes mellitus) type 2, uncontrolled, with ketoacidosis (HCC) 05/01/2017  . Essential hypertension 11/26/2016  . History of kidney stones   . HLD (hyperlipidemia) 05/01/2017  . IVCD (intraventricular conduction defect) 04/24/2015  . Mitral valve regurgitation   . Mixed dyslipidemia 11/26/2016  . Stroke (cerebrum) (HCC) 04/29/2017   Past Surgical History:  Past Surgical History:  Procedure Laterality Date  . BRAIN SURGERY    . CARDIAC CATHETERIZATION    . CORONARY ARTERY BYPASS GRAFT N/A 01/23/2018   Procedure: CORONARY ARTERY BYPASS GRAFTING (CABG) x 4 WITH ENDOSCOPIC HARVESTING OF RIGHT GREATER SAPHENOUS VEIN: LIMA TO LAD, SVG TO RCA, SVG TO DIAG, SVG TO OM ;  Surgeon: Kerin Perna, MD;  Location: [facility] OR;  Service: Open Heart Surgery;  Laterality: N/A;  . IR PERCUTANEOUS ART THROMBECTOMY/INFUSION INTRACRANIAL INC DIAG ANGIO  04/29/2017  . IR RADIOLOGIST EVAL & MGMT  05/17/2017  . IR US GUIDE VASC ACCESS LEFT  04/29/2017  . IR US GUIDE VASC ACCESS RIGHT  04/29/2017  . LEFT ATRIAL APPENDAGE OCCLUSION Left 01/23/2018   Procedure: LEFT ATRIAL APPENDAGE OCCLUSION USING ATRICURE ATRICLIP PRO2 LAA EXCLUSION SYSTEM  SIZE 40, LOT # [id number], CAT # [id number], EXP. DATE 2020-04-28;  Surgeon: Donata Clay, Theron Arista, MD;  Location: [facility] OR;  Service: Open Heart Surgery;  Laterality: Left;  . MITRAL VALVE REPLACEMENT N/A 01/23/2018   Procedure: MITRAL VALVE  (MV) REPLACEMENT USING MAGNA MITRAL EASE PERICARDIAL BIOPROSTHESIS, MODEL [id number], SIZE 29 MM, SERIAL # [id number], EXPIRATION  DATE 2021-03-03.;  Surgeon: Kerin Perna, MD;  Location: [facility] OR;  Service: Open Heart Surgery;  Laterality: N/A;  . MULTIPLE EXTRACTIONS WITH ALVEOLOPLASTY N/A 12/26/2017   Procedure: Extraction of tooth #'s 4,6-11, 18 -27, 30, and 31 with alveoloplasty;  Surgeon: [name], DDS;  Location: [facility] OR;  Service: Oral Surgery;  Laterality: N/A;  . RADIOLOGY WITH ANESTHESIA N/A 04/29/2017   Procedure: IR WITH ANESTHESIA;  Surgeon: Radiologist, Medication, MD;  Location: [facility] OR;  Service: Radiology;  Laterality: N/A;  . RIGHT/LEFT HEART CATH AND CORONARY ANGIOGRAPHY N/A 08/18/2017   Procedure: RIGHT/LEFT HEART CATH AND CORONARY ANGIOGRAPHY;  Surgeon: [name], MD;  Location: [facility] INVASIVE CV LAB;  Service: Cardiovascular;  Laterality: N/A;  . TEE WITHOUT CARDIOVERSION N/A 09/22/2017   Procedure: TRANSESOPHAGEAL ECHOCARDIOGRAM (TEE);  Surgeon: [name], MD;  Location: [facility] ENDOSCOPY;  Service: Cardiovascular;  Laterality: N/A;  . TEE WITHOUT CARDIOVERSION N/A 01/23/2018   Procedure: TRANSESOPHAGEAL ECHOCARDIOGRAM (TEE);  Surgeon: Donata Clay, Theron Arista, MD;  Location: [facility] OR;  Service: Open Heart Surgery;  Laterality: N/A;  . TONSILLECTOMY     HPI:  72 yo admitted 7/29 for CABGx4 and MVR. PMHx: DM, CAD, CHF, ICM, AFib, HTN CVA (04/2017), seen by SLP for cognition only. Intubated <24 hours for procedure, extubated 01/24/18. Pt has had all teeth removed; per RN has had difficulty swallowing pills. Referred for swallow evaluation. Head CT 01/27/18 revealed hypo attenuation with loss of gray-white  differentiation in the posterior right frontal region, potentially related to evolution of previously noted right MCA infarct, but acute to subacute ischemia in this region a concern. CXR shows diffuse interstitial infiltrates consistent with pulmonary edema.    Assessment / Plan /  Recommendation Clinical Impression   Patient presents with inconsistent, questionable signs of aspiration. Cough x 2, once with ice chips and with liquid wash of puree. No increase in work of breathing or respiratory rate, but after POs pt had decline in Sp02 to mid-upper 80s. Pt reports dysphagia recently due to tooth extraction 2 weeks ago; he was consuming purees at home prior to admission. He expectorates softened solids. Pt does have some risk factors for aspiration: brief intubation, questionable evolution vs acute/subacute ischemia per CT. Will proceed with MBS for objective evaluation of swallow function; will update recommendations after completion of instrumental exam. Will downgrade to dys 1, thin liquids pending MBS. D/w RN.    SLP Visit Diagnosis: Dysphagia, unspecified (R13.10)    Aspiration Risk  Mild aspiration risk    Diet Recommendation Dysphagia 1 (Puree);Thin liquid   Liquid Administration via: Cup;Straw Medication Administration: Whole meds with liquid Supervision: Staff to assist with self feeding Compensations: Slow rate;Small sips/bites Postural Changes: Seated upright at 90 degrees    Other  Recommendations Recommended Consults: Other (Comment)(Recommend OT consult) Oral Care Recommendations: Oral care BID   Follow up Recommendations Other (comment)(tbd)      Frequency and Duration            Prognosis Prognosis for Safe Diet Advancement: Good Barriers to Reach Goals: (dentition)      Swallow Study   General Date of Onset: 01/23/18 HPI: 72 yo admitted 7/29 for CABGx4 and MVR. PMHx: DM, CAD, CHF, ICM, AFib, HTN CVA (04/2017), seen by SLP for cognition only. Intubated <24 hours for procedure, extubated 01/24/18. Pt has had all teeth removed; per RN has had difficulty swallowing pills. Referred for swallow evaluation. Head CT 01/27/18 revealed hypo attenuation with loss of gray-white differentiation in the posterior right frontal region, potentially related to  evolution of previously noted right MCA infarct, but acute to subacute ischemia in this region a concern. CXR shows diffuse interstitial infiltrates consistent with pulmonary edema.  Type of Study: Bedside Swallow Evaluation Previous Swallow Assessment: none in chart Diet Prior to this Study: Regular;Thin liquids Temperature Spikes Noted: No Respiratory Status: Nasal cannula History of Recent Intubation: Yes Length of Intubations (days): (for procedure <24 hours) Date extubated: 01/24/18 Behavior/Cognition: Alert;Cooperative;Pleasant mood Oral Cavity Assessment: Within Functional Limits Oral Care Completed by SLP: No Oral Cavity - Dentition: Edentulous Vision: Functional for self-feeding Self-Feeding Abilities: Able to feed self Patient Positioning: Upright in bed Baseline Vocal Quality: Normal Volitional Cough: Weak Volitional Swallow: Able to elicit    Oral/Motor/Sensory Function Overall Oral Motor/Sensory Function: Mild impairment Facial ROM: Reduced left;Suspected CN VII (facial) dysfunction(minimal) Facial Symmetry: Abnormal symmetry left;Suspected CN VII (facial) dysfunction(minimal) Facial Strength: Within Functional Limits Lingual ROM: Within Functional Limits Lingual Symmetry: Within Functional Limits Lingual Strength: Reduced Lingual Sensation: Within Functional Limits Velum: Within Functional Limits Mandible: Within Functional Limits   Ice Chips Ice chips: Impaired Presentation: Spoon Oral Phase Impairments: Impaired mastication Oral Phase Functional Implications: Left anterior spillage Pharyngeal Phase Impairments: Cough - Immediate   Thin Liquid Thin Liquid: Impaired Pharyngeal  Phase Impairments: Change in Vital Signs    Nectar Thick Nectar Thick Liquid: Not tested   Honey Thick Honey Thick Liquid: Not tested   Puree Puree: Within functional limits  Presentation: Self Fed;Spoon   Solid     Solid: Impaired Presentation: Self Fed Oral Phase Impairments:  Impaired mastication;Reduced lingual movement/coordination Oral Phase Functional Implications: Impaired mastication;Prolonged oral transit;Other (comment)(expectorated)     Rondel BatonMary Beth Caterina Racine, MS, CCC-SLP Speech-Language Pathologist [phone]  Arlana LindauMary E Kaylanni Ezelle 01/29/2018,9:53 AM

## 2018-01-30 LAB — GLUCOSE, CAPILLARY
Glucose-Capillary: 128 mg/dL — ABNORMAL HIGH (ref 70–99)
Glucose-Capillary: 146 mg/dL — ABNORMAL HIGH (ref 70–99)
Glucose-Capillary: 151 mg/dL — ABNORMAL HIGH (ref 70–99)
Glucose-Capillary: 145 mg/dL — ABNORMAL HIGH (ref 70–99)
Glucose-Capillary: 126 mg/dL — ABNORMAL HIGH (ref 70–99)

## 2018-01-30 LAB — PROTIME-INR
INR: 1.21
Prothrombin Time: 15.2 seconds (ref 11.4–15.2)

## 2018-01-30 LAB — COMPREHENSIVE METABOLIC PANEL
ALT: 17 U/L (ref 0–44)
AST: 24 U/L (ref 15–41)
Albumin: 2.6 g/dL — ABNORMAL LOW (ref 3.5–5.0)
Alkaline Phosphatase: 66 U/L (ref 38–126)
Anion gap: 10 (ref 5–15)
BUN: 25 mg/dL — ABNORMAL HIGH (ref 8–23)
CO2: 32 mmol/L (ref 22–32)
Calcium: 8.6 mg/dL — ABNORMAL LOW (ref 8.9–10.3)
Chloride: 89 mmol/L — ABNORMAL LOW (ref 98–111)
Creatinine, Ser: 0.99 mg/dL (ref 0.61–1.24)
GFR calc Af Amer: >60 mL/min (ref >60)
GFR calc non Af Amer: >60 mL/min (ref >60)
Glucose, Bld: 136 mg/dL — ABNORMAL HIGH (ref 70–99)
Potassium: 3.9 mmol/L (ref 3.5–5.1)
Sodium: 131 mmol/L — ABNORMAL LOW (ref 135–145)
Total Bilirubin: 1.6 mg/dL — ABNORMAL HIGH (ref 0.3–1.2)
Total Protein: 5.9 g/dL — ABNORMAL LOW (ref 6.5–8.1)

## 2018-01-30 LAB — COOXEMETRY PANEL
Carboxyhemoglobin: 2.1 % — ABNORMAL HIGH (ref 0.5–1.5)
Methemoglobin: 1.3 % (ref 0.0–1.5)
O2 Saturation: 69.5 %
Total hemoglobin: 9.3 g/dL — ABNORMAL LOW (ref 12.0–16.0)

## 2018-01-30 LAB — CBC
HCT: 28.8 % — ABNORMAL LOW (ref 39.0–52.0)
Hemoglobin: 9.4 g/dL — ABNORMAL LOW (ref 13.0–17.0)
MCH: 30.6 pg (ref 26.0–34.0)
MCHC: 32.6 g/dL (ref 30.0–36.0)
MCV: 93.8 fL (ref 78.0–100.0)
Platelets: 222 10*3/uL (ref 150–400)
RBC: 3.07 MIL/uL — ABNORMAL LOW (ref 4.22–5.81)
RDW: 13.5 % (ref 11.5–15.5)
WBC: 13.9 10*3/uL — ABNORMAL HIGH (ref 4.0–10.5)

## 2018-01-30 MED ORDER — WARFARIN VIDEO: Freq: Once | Status: DC

## 2018-01-30 MED ORDER — FUROSEMIDE 40 MG PO TABS
40.0000 mg | ORAL_TABLET | Freq: Every day | ORAL | Status: DC
  Administered 2018-01-30 – 2018-01-31 (×2): 40 mg via ORAL
  Filled 2018-01-30 (×3): qty 1

## 2018-01-30 MED ORDER — WARFARIN SODIUM 5 MG PO TABS
5.0000 mg | ORAL_TABLET | Freq: Every day | ORAL | Status: AC
  Administered 2018-01-30: 5 mg via ORAL
  Filled 2018-01-30: qty 1

## 2018-01-30 NOTE — Progress Notes (Signed)
Physical Therapy Treatment Patient Details Name: Nathan [name]. MRN: [id number] DOB: 1946/03/24 Today's Date: 01/30/2018    History of Present Illness 72 yo admitted 7/29 for CABGx4 and MVR. PMHx: DM, CAD, CHF, ICM, AFib, CVA, HTN    PT Comments    Pt with flat affect, oriented but lacks recall of precautions even after education within session. Pt in chair on arrival and able to stand with decreased assist but still requires mod assist and max cues to stand. Pt able to walk very short distances today and appears to demonstrate self-limiting behavior regarding gait and benefits from encouragement and reassurance to maximize function. Pt performed bil LE HEP in chair end of session with tactile and verbal cues for full ROM of LLE and able to perform with RLE without additional cues. Pt educated for all precautions and need to progress function and mobility. Will continue to follow  BP 111/63 HR 55-67 SpO2 92-97% RA    Follow Up Recommendations  SNF;Supervision/Assistance - 24 hour     Equipment Recommendations  Rolling walker with 5" wheels;3in1 (PT)    Recommendations for Other Services       Precautions / Restrictions Precautions Precautions: Fall;Sternal Precaution Booklet Issued: Yes (comment) Precaution Comments: left weak Restrictions Weight Bearing Restrictions: Yes(sternal precautions)    Mobility  Bed Mobility               General bed mobility comments: In recliner upon arrival  Transfers Overall transfer level: Needs assistance Equipment used: Rolling walker (2 wheeled) Transfers: Sit to/from Stand Sit to Stand: Mod assist;+2 safety/equipment         General transfer comment: mod assist x 2 to stand from chair x 3 trials with left knee blocked, cues for hands on knees and assist of belt with increased time to rise and stabilize  Ambulation/Gait Ambulation/Gait assistance: Mod assist;+2 safety/equipment Gait Distance (Feet): 5 Feet Assistive  device: Rolling walker (2 wheeled) Gait Pattern/deviations: Step-to pattern;Decreased stride length;Trunk flexed;Narrow base of support   Gait velocity interpretation: <1.31 ft/sec, indicative of household ambulator General Gait Details: pt with initial left lean in standing with cues and assist for midline posture with assist to control RW and to advance and place LLE to maintain wider BOB and improve position to midline. Pt walked 2', 3', 5' with close chair follow and seated rest after each trial. Pt limited by fatigue despite stable vital signs and max encouragement   Stairs             Wheelchair Mobility    Modified Rankin (Stroke Patients Only)       Balance Overall balance assessment: Needs assistance Sitting-balance support: No upper extremity supported;Feet supported Sitting balance-Leahy Scale: Poor Sitting balance - Comments: pt with tendency for left lean but able to correct to midline with cues and maintain Postural control: Left lateral lean Standing balance support: Bilateral upper extremity supported Standing balance-Leahy Scale: Poor Standing balance comment: Reliant on UE support                            Cognition Arousal/Alertness: Awake/alert Behavior During Therapy: Flat affect Overall Cognitive Status: Impaired/Different from baseline Area of Impairment: Memory;Following commands;Safety/judgement                     Memory: Decreased recall of precautions Following Commands: Follows one step commands inconsistently;Follows one step commands with increased time Safety/Judgement: Decreased awareness of deficits;Decreased awareness of  safety     General Comments: pt with tendency for eyes closed at rest, pt unable to recall sternal precautions other than keeping arms in      Exercises General Exercises - Lower Extremity Long Arc Quad: AROM;15 reps;Both;Seated Hip Flexion/Marching: AAROM;15 reps;Seated;Both    General  Comments       Pertinent Vitals/Pain Pain Assessment: Faces Pain Score: 5  Faces Pain Scale: Hurts little more Pain Location: chest, back, left hip Pain Descriptors / Indicators: Aching;Sore Pain Intervention(s): Limited activity within patient's tolerance;Repositioned;Monitored during session    Home Living Family/patient expects to be discharged to:: Private residence Living Arrangements: Spouse/significant other Available Help at Discharge: Family;Available 24 hours/day Type of Home: House Home Access: Level entry   Home Layout: One level Home Equipment: None      Prior Function Level of Independence: Independent         PT Goals (current goals can now be found in the care plan section) Acute Rehab PT Goals Patient Stated Goal: return to golf and cooking Progress towards PT goals: Progressing toward goals    Frequency           PT Plan Current plan remains appropriate    Co-evaluation              AM-PAC PT "6 Clicks" Daily Activity  Outcome Measure  Difficulty turning over in bed (including adjusting bedclothes, sheets and blankets)?: Unable Difficulty moving from lying on back to sitting on the side of the bed? : Unable Difficulty sitting down on and standing up from a chair with arms (e.g., wheelchair, bedside commode, etc,.)?: Unable Help needed moving to and from a bed to chair (including a wheelchair)?: A Lot Help needed walking in hospital room?: A Lot Help needed climbing 3-5 steps with a railing? : Total 6 Click Score: 8    End of Session Equipment Utilized During Treatment: Gait belt Activity Tolerance: Patient tolerated treatment well Patient left: in chair;with call bell/phone within reach;with chair alarm set;with nursing/sitter in room Nurse Communication: Mobility status;Need for lift equipment;Precautions PT Visit Diagnosis: Other abnormalities of gait and mobility (R26.89);Muscle weakness (generalized) (M62.81);Unsteadiness on feet  (R26.81);Difficulty in walking, not elsewhere classified (R26.2)     Time: [id number] PT Time Calculation (min) (ACUTE ONLY): 24 min  Charges:  $Gait Training: 8-22 mins $Therapeutic Exercise: 8-22 mins                     [id number]Carime Dinkel Tabor Shaheed Schmuck, PT [phone]    Enedina FinnerMaija B Tej Murdaugh 01/30/2018, 11:37 AM

## 2018-01-30 NOTE — Progress Notes (Signed)
Initial Nutrition Assessment  DOCUMENTATION CODES:   Non-severe (moderate) malnutrition in context of chronic illness  INTERVENTION:   -Pudding TID on meal trays  -Family to bring in protein supplement from home that pt prefers; discussed taking the protein supplement BID  -Discussed food preferences to optimize po intake on Puree diet   NUTRITION DIAGNOSIS:   Moderate Malnutrition related to chronic illness(CAD, ICM EF 35%, CVA, moderate-severe MR; malnutrition exacerbated by teeth removal) as evidenced by mild fat depletion, moderate muscle depletion.  GOAL:   Patient will meet greater than or equal to 90% of their needs  MONITOR:   PO intake, Supplement acceptance, Labs, Weight trends  REASON FOR ASSESSMENT:   Consult Assessment of nutrition requirement/status  ASSESSMENT:   72 yo male admitted with CAD and moderate to severe mitral regurgitation, severe LV dysfunction with CABG x 4 and MVR on 7/29. PMH of CAD with ischemic CM EF 35%, CVA, moderate-severe ischemic mitral regurgitation and afib, DM, HTN  7/29 CABG x 4, MVR 8/04 MBS, Dysphagia I/thins  Recorded po intake 0-50%. Recorded po intake <15% on average. Pt reports altered taste since having Stroke in 04/2017, poor appetite at present. Pt also had his teeth removed 1 month ago in prep for his CABG and MVR. Pt has been eating puree food since that time frame, pt does indicate that he has started to incorporate more semi-solid foods as able at home. For instance, pt has been eating spaghetti (he chops the pasta in his Ninja blender). Pt also eating omelettes made with V-8 (in place of tomatoes) and cheese. Pt eats 2 meals per day; omelette at breakfast, dinner varies. Pt does not like the food we provide on a Puree diet; pt is hopeful for diet advancement at some point. SLP is following  Pt does not like Ensure or Boost; receiving Magic Cup on Puree trays but does not like, not eating. Pt reports he prefers a protein  powder (cookies and cream flavor but cannot remember the name) that he mixes with milk. Family to bring in for patient and pt to drink 2x/day. Pt likes pudding; RD will send pudding TID on meal trays  Pt reports weight loss of 35-40 pounds since stroke in November. >15% weight loss in 9 months. Weight down since admission; pt essentially net even per I/O flow sheet; current wt 201 pounds, admission wt 204 pounds  Pt fairly active PTA; performs all ADLs. Pt does the cooking at home.   Labs: sodium 131, CBGs 94-237, Creatinine wdl,  Meds: lasix,ss novolog, levemir  NUTRITION - FOCUSED PHYSICAL EXAM:    Most Recent Value  Orbital Region  Mild depletion  Upper Arm Region  Mild depletion  Thoracic and Lumbar Region  Mild depletion  Buccal Region  Mild depletion  Temple Region  Mild depletion  Clavicle Bone Region  Moderate depletion  Clavicle and Acromion Bone Region  Moderate depletion  Scapular Bone Region  Moderate depletion  Dorsal Hand  No depletion  Patellar Region  Mild depletion  Anterior Thigh Region  Mild depletion  Posterior Calf Region  No depletion  Edema (RD Assessment)  Mild       Diet Order:   Diet Order           DIET - DYS 1 Room service appropriate? Yes with Assist; Fluid consistency: Thin  Diet effective now          EDUCATION NEEDS:   Education needs have been addressed  Skin:  Skin Assessment: Reviewed  RN Assessment  Last BM:  8/4  Height:   Ht Readings from Last 1 Encounters:  01/23/18 5' 11.5" (1.816 m)    Weight:   Wt Readings from Last 1 Encounters:  01/30/18 201 lb 8 oz (91.4 kg)    Ideal Body Weight:     BMI:  Body mass index is 27.71 kg/m.  Estimated Nutritional Needs:   Kcal:  2100-2300 kcals   Protein:  105-115 g  Fluid:  >/= 2L    Romelle Starcher MS, RD, LDN, CNSC [phone] Pager  [phone] Weekend/On-Call Pager

## 2018-01-30 NOTE — Evaluation (Signed)
Occupational Therapy Evaluation Patient Details Name: Nathan PhilipsRandleman D Supak Jr. MRN: [id number] DOB: [birth year] Today's Date: 01/30/2018    History of Present Illness 72 yo admitted 7/29 for CABGx4 and MVR. PMHx: DM, CAD, CHF, ICM, AFib, CVA, HTN   Clinical Impression   PTA, pt was living with his wife and was independent. Currently, pt requiring Min A for UB ADLs, Max A for LB ADLs, and Max A+2 for functional transfers with RW. Pt requiring increased cues for recall of sternal precautions. Pt presenting with decreased strength, balance, and activity tolerance. Pt would benefit from further acute OT to facilitate safe dc. Recommend dc to SNF for further OT to optimize safety, independence with ADLs, and return to PLOF.   HR 56, RR 22, SpO2 90s on 1L O2    Follow Up Recommendations  SNF    Equipment Recommendations  3 in 1 bedside commode    Recommendations for Other Services PT consult     Precautions / Restrictions Precautions Precautions: Fall;Sternal Precaution Booklet Issued: Yes (comment) Precaution Comments: left weak Restrictions Weight Bearing Restrictions: Yes(sternal precautions)      Mobility Bed Mobility               General bed mobility comments: In recliner upon arrival  Transfers Overall transfer level: Needs assistance Equipment used: Rolling walker (2 wheeled) Transfers: Sit to/from Stand Sit to Stand: Max assist;+2 physical assistance;From elevated surface         General transfer comment: Max A+2 to power up into standing. max cues for hand positioning and to recall sternal precautions    Balance Overall balance assessment: Needs assistance Sitting-balance support: No upper extremity supported;Feet supported Sitting balance-Leahy Scale: Fair Sitting balance - Comments: Able to bring ankles to knees for LB dressing   Standing balance support: Bilateral upper extremity supported;During functional activity Standing balance-Leahy Scale:  Poor Standing balance comment: Reliant on UE support                           ADL either performed or assessed with clinical judgement   ADL Overall ADL's : Needs assistance/impaired Eating/Feeding: Independent;Sitting   Grooming: Set up;Supervision/safety;Sitting   Upper Body Bathing: Minimal assistance;Sitting   Lower Body Bathing: Maximal assistance;Sit to/from stand Lower Body Bathing Details (indicate cue type and reason): Max A for sit<>Stand.  Upper Body Dressing : Minimal assistance;Sitting   Lower Body Dressing: Maximal assistance;Sit to/from stand Lower Body Dressing Details (indicate cue type and reason): Pt able to bring his ankles to his knees for donning socks. Requiring Min A to facilitate movement towards knees and then adjust socks position. Max A for sit<>stand Toilet Transfer: Maximal assistance;RW(Simulated in room. Sit<>stand only)           Functional mobility during ADLs: Maximal assistance;+2 for safety/equipment;Rolling walker;+2 for physical assistance       Vision Baseline Vision/History: Wears glasses Patient Visual Report: No change from baseline       Perception     Praxis      Pertinent Vitals/Pain Pain Assessment: Faces Faces Pain Scale: Hurts little more Pain Location: jaw Pain Descriptors / Indicators: Aching;Sore Pain Intervention(s): Monitored during session;Limited activity within patient's tolerance;Repositioned     Hand Dominance Left   Extremity/Trunk Assessment Upper Extremity Assessment Upper Extremity Assessment: Generalized weakness   Lower Extremity Assessment Lower Extremity Assessment: Defer to PT evaluation LLE Deficits / Details: pt with decreaseds strength grossly 3/5 not formally assessed   Cervical /  Trunk Assessment Cervical / Trunk Assessment: Other exceptions Cervical / Trunk Exceptions: flexed trunk with left lean in sitting and standing   Communication Communication Communication: HOH    Cognition Arousal/Alertness: Awake/alert Behavior During Therapy: WFL for tasks assessed/performed Overall Cognitive Status: Impaired/Different from baseline Area of Impairment: Memory;Following commands;Safety/judgement                     Memory: Decreased recall of precautions Following Commands: Follows one step commands inconsistently;Follows one step commands with increased time Safety/Judgement: Decreased awareness of deficits;Decreased awareness of safety     General Comments: Pt with tendency to close eyes during session and require VCs for opening his eyes.    General Comments  HR 56-58. SpO2 90s on 1L O2. RR 22.    Exercises     Shoulder Instructions      Home Living Family/patient expects to be discharged to:: Private residence Living Arrangements: Spouse/significant other Available Help at Discharge: Family;Available 24 hours/day Type of Home: House Home Access: Level entry     Home Layout: One level     Bathroom Shower/Tub: Chief Strategy Officer: Standard     Home Equipment: None          Prior Functioning/Environment Level of Independence: Independent        Comments: drives, likes to cook and golf, wife states pt was having memory deficits prior to admission        OT Problem List: Decreased strength;Decreased range of motion;Decreased activity tolerance;Impaired balance (sitting and/or standing);Decreased safety awareness;Decreased knowledge of use of DME or AE;Decreased knowledge of precautions;Pain;Cardiopulmonary status limiting activity      OT Treatment/Interventions: Self-care/ADL training;Therapeutic exercise;Energy conservation;DME and/or AE instruction;Therapeutic activities;Patient/family education    OT Goals(Current goals can be found in the care plan section) Acute Rehab OT Goals Patient Stated Goal: return to golf and cooking OT Goal Formulation: With patient Time For Goal Achievement: 02/13/18 Potential  to Achieve Goals: Good ADL Goals Pt Will Perform Upper Body Dressing: with set-up;with supervision;sitting Pt Will Perform Lower Body Dressing: with min assist;sit to/from stand Pt Will Transfer to Toilet: with min assist;ambulating;bedside commode Pt Will Perform Toileting - Clothing Manipulation and hygiene: with min assist;sit to/from stand Additional ADL Goal #1: Pt will peform bed mobility with supervision in preparation for ADLs Additional ADL Goal #2: Pt will verbalize sternal precautions with 1-2 VCs  OT Frequency: Min 2X/week   Barriers to D/C:            Co-evaluation              AM-PAC PT "6 Clicks" Daily Activity     Outcome Measure Help from another person eating meals?: None Help from another person taking care of personal grooming?: A Little Help from another person toileting, which includes using toliet, bedpan, or urinal?: A Lot Help from another person bathing (including washing, rinsing, drying)?: A Lot Help from another person to put on and taking off regular upper body clothing?: A Little Help from another person to put on and taking off regular lower body clothing?: A Lot 6 Click Score: 16   End of Session Equipment Utilized During Treatment: Gait belt;Rolling walker;Oxygen Nurse Communication: Mobility status;Precautions  Activity Tolerance: Patient limited by fatigue;Patient limited by pain Patient left: in chair;with call bell/phone within reach;with chair alarm set  OT Visit Diagnosis: Unsteadiness on feet (R26.81);Other abnormalities of gait and mobility (R26.89);Muscle weakness (generalized) (M62.81);Pain Pain - part of body: (Chest)  Time: 1610-9604 OT Time Calculation (min): 18 min Charges:  OT General Charges $OT Visit: 1 Visit OT Evaluation $OT Eval Moderate Complexity: 1 Mod  Haneefah Venturini MSOT, OTR/L Acute Rehab Pager: [phone] Office: [phone]  Theodoro Grist Hiedi [place]ton 01/30/2018, 11:21 AM

## 2018-01-30 NOTE — Progress Notes (Signed)
Discontinued pacing wires per order via protocol. Pt tolerated well. Melodye PedAngela Cadance Raus,RN

## 2018-01-30 NOTE — Progress Notes (Addendum)
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 (N/A) CORONARY ARTERY BYPASS GRAFTING (CABG) x 4 WITH ENDOSCOPIC HARVESTING OF RIGHT GREATER SAPHENOUS VEIN: LIMA TO LAD, SVG TO RCA, SVG TO DIAG, SVG TO OM  (N/A) TRANSESOPHAGEAL ECHOCARDIOGRAM (TEE) (N/A) LEFT ATRIAL APPENDAGE OCCLUSION USING ATRICURE ATRICLIP PRO2 LAA EXCLUSION SYSTEM  SIZE 40, LOT # [id number], CAT # [id number], EXP. DATE 2020-04-28 (Left)  Total Length of Stay:  LOS: 7 days   Subjective:  No new complaints.  Trying to nap.  He has worked with PT/OT this morning.  Objective: Vital signs in last 24 hours: Temp:  [97.7 F (36.5 C)-98.5 F (36.9 C)] 97.7 F (36.5 C) (08/05 0756) Pulse Rate:  [57-68] 62 (08/05 0800) Cardiac Rhythm: Atrial fibrillation (08/05 0756) Resp:  [12-25] 16 (08/05 0800) BP: (87-125)/(34-100) 101/73 (08/05 0800) SpO2:  [91 %-100 %] 100 % (08/05 0800) Weight:  [201 lb 8 oz (91.4 kg)] 201 lb 8 oz (91.4 kg) (08/05 0756)  Filed Weights   01/28/18 0433 01/29/18 0400 01/30/18 0756  Weight: 206 lb 9.1 oz (93.7 kg) 211 lb 3.2 oz (95.8 kg) 201 lb 8 oz (91.4 kg)    Weight change:    Hemodynamic parameters for last 24 hours: CVP:  [7 mmHg-10 mmHg] 8 mmHg  Intake/Output from previous day: 08/04 0701 - 08/05 0700 In: 1482.7 [P.O.:880; I.V.:452.7; IV Piggyback:150] Out: 350 [Urine:350]  Intake/Output this shift: Total I/O In: 31.8 [I.V.:31.8] Out: -   Current Meds: Scheduled Meds: . aspirin EC  325 mg Oral Daily   Or  . aspirin  324 mg Per Tube Daily  . bisacodyl  10 mg Oral Daily   Or  . bisacodyl  10 mg Rectal Daily  . Chlorhexidine Gluconate Cloth  6 each Topical Daily  . enoxaparin (LOVENOX) injection  40 mg  Subcutaneous Q24H  . furosemide  40 mg Oral Daily  . guaiFENesin  1,200 mg Oral BID  . insulin aspart  0-24 Units Subcutaneous TID WC  . insulin detemir  15 Units Subcutaneous Daily  . mouth rinse  15 mL Mouth Rinse BID  . pantoprazole  40 mg Oral Daily  . potassium chloride  40 mEq Oral BID  . simvastatin  20 mg Oral QHS  . sodium chloride flush  10-40 mL Intracatheter Q12H  . tamsulosin  0.4 mg Oral QHS  . warfarin  5 mg Oral q1800  . Warfarin - Physician Dosing Inpatient   Does not apply q1800   Continuous Infusions: . lactated ringers Stopped (01/30/18 0750)   PRN Meds:.docusate sodium, ondansetron (ZOFRAN) IV, oxyCODONE, pneumococcal 23 valent vaccine, sodium chloride flush, traMADol  General appearance: alert, cooperative and no distress Heart: irregularly irregular rhythm Lungs: clear to auscultation bilaterally Abdomen: soft, non-tender; bowel sounds normal; no masses,  no organomegaly Extremities: edema trace Wound: clean and dry  Lab Results: CBC: Recent Labs    01/29/18 0445 01/30/18 0403  WBC 9.6 13.9*  HGB 9.0* 9.4*  HCT 27.5* 28.8*  PLT 176 222   BMET:  Recent Labs    01/29/18 0445 01/30/18 0403  NA 135 131*  K 3.2* 3.9  CL 86* 89*  CO2 36* 32  GLUCOSE 68* 136*  BUN 19 25*  CREATININE 0.90 0.99  CALCIUM 8.5* 8.6*    CMET: Lab Results  Component Value Date   WBC 13.9 (H) 01/30/2018   HGB 9.4 (L) 01/30/2018   HCT 28.8 (L) 01/30/2018   PLT 222 01/30/2018   GLUCOSE 136 (H) 01/30/2018   CHOL 136 07/28/2017   TRIG 101 07/28/2017   HDL 34 (L) 07/28/2017   LDLCALC 82 07/28/2017   ALT 17 01/30/2018   AST 24 01/30/2018   NA 131 (L) 01/30/2018   K 3.9 01/30/2018   CL 89 (L) 01/30/2018   CREATININE 0.99 01/30/2018   BUN 25 (H) 01/30/2018   CO2 32 01/30/2018   TSH 2.140 07/28/2017   INR 1.21 01/30/2018   HGBA1C 5.9 (H) 01/19/2018      PT/INR:  Recent Labs    01/30/18 0403  LABPROT 15.2  INR 1.21   Radiology: Dg Swallowing  Func-speech Pathology  Result Date: 01/29/2018 Objective Swallowing Evaluation: Type of Study: MBS-Modified Barium Swallow Study  Patient Details Name: Nathan [name]. MRN: [id number] Date of Birth: [birth year] Today's Date: 01/29/2018 Time: SLP Start Time (ACUTE ONLY): 1224 -SLP Stop Time (ACUTE ONLY): 1244 SLP Time Calculation (min) (ACUTE ONLY): 20 min Past Medical History: Past Medical History: Diagnosis Date . Atrial fibrillation with RVR (HCC) 05/01/2017 . Cardiomyopathy (HCC) 05/01/2017 . CHF (congestive heart failure) (HCC)  . 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 Coronary artery disease  . DM (diabetes mellitus) type 2, uncontrolled, with ketoacidosis (HCC) 05/01/2017 . Essential hypertension 11/26/2016 . History of kidney stones  . HLD (hyperlipidemia) 05/01/2017 . IVCD (intraventricular conduction defect) 04/24/2015 . Mitral valve regurgitation  . Mixed dyslipidemia 11/26/2016 . Stroke (cerebrum) (HCC) 04/29/2017 Past Surgical History: Past Surgical History: Procedure Laterality Date . BRAIN SURGERY   . CARDIAC CATHETERIZATION   . CORONARY ARTERY BYPASS GRAFT N/A 01/23/2018  Procedure: CORONARY ARTERY BYPASS GRAFTING (CABG) x 4 WITH ENDOSCOPIC HARVESTING OF RIGHT GREATER SAPHENOUS VEIN: LIMA TO LAD, SVG TO RCA, SVG TO DIAG, SVG TO OM ;  Surgeon: Kerin Perna, MD;  Location: [facility] OR;  Service: Open Heart Surgery;  Laterality: N/A; . IR PERCUTANEOUS ART THROMBECTOMY/INFUSION INTRACRANIAL INC DIAG ANGIO  04/29/2017 . IR RADIOLOGIST EVAL & MGMT  05/17/2017 . IR US GUIDE VASC ACCESS LEFT  04/29/2017 . IR US GUIDE VASC ACCESS RIGHT  04/29/2017 . LEFT ATRIAL APPENDAGE OCCLUSION Left 01/23/2018  Procedure: LEFT ATRIAL APPENDAGE OCCLUSION USING ATRICURE ATRICLIP PRO2 LAA EXCLUSION SYSTEM  SIZE 40, LOT # [id number], CAT # [id number], EXP. DATE 2020-04-28;  Surgeon: Donata Clay, Theron Arista, MD;  Location: [facility] OR;  Service: Open Heart Surgery;  Laterality: Left; . MITRAL VALVE REPLACEMENT N/A 01/23/2018   Procedure: MITRAL VALVE (MV) REPLACEMENT USING MAGNA MITRAL EASE PERICARDIAL BIOPROSTHESIS, MODEL 7300TFX, SIZE 29 MM, SERIAL # [id number], EXPIRATION  DATE 2021-03-03.;  Surgeon: Kerin Perna, MD;  Location: [facility] OR;  Service: Open Heart Surgery;  Laterality: N/A; . MULTIPLE EXTRACTIONS WITH ALVEOLOPLASTY N/A 12/26/2017  Procedure: Extraction of tooth #'s 4,6-11, 18 -27, 30, and 31 with alveoloplasty;  Surgeon: [name], DDS;  Location: [facility] OR;  Service: Oral Surgery;  Laterality: N/A; . RADIOLOGY WITH ANESTHESIA N/A 04/29/2017  Procedure: IR WITH ANESTHESIA;  Surgeon: Radiologist, Medication, MD;  Location: [facility] OR;  Service: Radiology;  Laterality: N/A; . RIGHT/LEFT HEART CATH AND CORONARY ANGIOGRAPHY N/A 08/18/2017  Procedure: RIGHT/LEFT HEART CATH AND CORONARY ANGIOGRAPHY;  Surgeon: [name], MD;  Location: [facility] INVASIVE CV LAB;  Service: Cardiovascular;  Laterality: N/A; . TEE WITHOUT CARDIOVERSION N/A 09/22/2017  Procedure: TRANSESOPHAGEAL ECHOCARDIOGRAM (TEE);  Surgeon: [name], MD;  Location: [facility] ENDOSCOPY;  Service: Cardiovascular;  Laterality: N/A; . TEE WITHOUT CARDIOVERSION N/A 01/23/2018  Procedure: TRANSESOPHAGEAL ECHOCARDIOGRAM (TEE);  Surgeon: Donata Clay, Theron Arista, MD;  Location: [facility] OR;  Service: Open Heart Surgery;  Laterality: N/A; . TONSILLECTOMY   HPI: 72 yo admitted 7/29 for CABGx4 and MVR. PMHx: DM, CAD, CHF, ICM, AFib, HTN CVA (04/2017), seen by SLP for cognition only. Intubated <24 hours for procedure, extubated 01/24/18. Pt has had all teeth removed; per RN has had difficulty swallowing pills. Referred for swallow evaluation. Head CT 01/27/18 revealed hypo attenuation with loss of gray-white differentiation in the posterior right frontal region, potentially related to evolution of previously noted right MCA infarct, but acute to subacute ischemia in this region a concern. CXR shows diffuse interstitial infiltrates consistent with pulmonary edema.  Subjective: Pt arrives to fluoro  with RN Assessment / Plan / Recommendation CHL IP CLINICAL IMPRESSIONS 01/29/2018 Clinical Impression Patient presents with moderate oral and mild pharyngeal dysphagia, with trace silent aspiration of thin liquid x1 due to premature spillage, decreased airway/laryngeal closure. Oral stage characterized by impaired mastication and decreased anterior to posterior transit, which pt attributes to recent tooth extraction. Pt reporting, "I can't swallow" with soft solid, despite having already transferred bolus to base of tongue/valleculae and was eventually able to initiate a swallow with encouragement; minimal base of tongue residue clears with liquid wash. Decreased bolus cohesion with thin/barium tablet; pt retained tablet on lingual surface and ultimately achieved transit with puree. There is discoordination with larger sips, resulting in premature spillage. Pharyngeal stage characterized by reduced epiglottic deflection due to structure of the cervical spine and decreased airway/laryngeal closure. With larger straw sip of thin liquids, there was single instance of penetration and trace aspiration before the swallow. No penetration/aspiration with smaller cup sips or with straw sips with chin tuck. Potential for aspiration events given decreased laryngeal closure and discoordination/premature spillage with larger sips. Would continue dys 1, thin liquids but with no straws, small cup sips only. Meds whole in puree or may crush if pt has difficulty with oral transit. Pt likely to require assistance for feeding. SLP will follow up.  SLP Visit Diagnosis Dysphagia, oral phase (R13.11);Dysphagia, pharyngeal phase (R13.13) Attention and concentration deficit following -- Frontal lobe and executive function deficit following -- Impact on safety and function Mild aspiration risk   CHL IP TREATMENT RECOMMENDATION 01/29/2018 Treatment Recommendations Therapy as outlined in treatment plan below   Prognosis 01/29/2018 Prognosis for  Safe Diet Advancement Good Barriers to Reach Goals Other (Comment) Barriers/Prognosis Comment -- CHL IP DIET RECOMMENDATION 01/29/2018 SLP Diet Recommendations Dysphagia 1 (Puree) solids;Thin liquid Liquid Administration via Cup;No straw Medication Administration Whole meds with puree Compensations Slow rate;Small sips/bites Postural Changes --   CHL IP OTHER RECOMMENDATIONS 01/29/2018 Recommended Consults -- Oral Care Recommendations Oral care BID Other Recommendations --   CHL IP FOLLOW UP RECOMMENDATIONS 01/29/2018 Follow up Recommendations Skilled Nursing facility   [facility] IP FREQUENCY AND DURATION 01/29/2018 Speech Therapy Frequency (ACUTE ONLY) min 1 x/week Treatment Duration 2 weeks      CHL IP ORAL PHASE 01/29/2018 Oral Phase Impaired Oral - Pudding Teaspoon -- Oral - Pudding Cup -- Oral - Honey Teaspoon -- Oral - Honey Cup -- Oral - Nectar Teaspoon --  Oral - Nectar Cup -- Oral - Nectar Straw -- Oral - Thin Teaspoon WFL Oral - Thin Cup Left anterior bolus loss;Decreased bolus cohesion;Premature spillage Oral - Thin Straw Decreased bolus cohesion;Premature spillage Oral - Puree Reduced posterior propulsion Oral - Mech Soft Impaired mastication;Reduced posterior propulsion;Delayed oral transit;Decreased bolus cohesion;Premature spillage Oral - Regular -- Oral - Multi-Consistency Reduced posterior propulsion;Decreased bolus cohesion;Delayed oral transit;Premature spillage Oral - Pill Decreased bolus cohesion;Delayed oral transit;Reduced posterior propulsion Oral Phase - Comment --  CHL IP PHARYNGEAL PHASE 01/29/2018 Pharyngeal Phase Impaired Pharyngeal- Pudding Teaspoon -- Pharyngeal -- Pharyngeal- Pudding Cup -- Pharyngeal -- Pharyngeal- Honey Teaspoon -- Pharyngeal -- Pharyngeal- Honey Cup -- Pharyngeal -- Pharyngeal- Nectar Teaspoon -- Pharyngeal -- Pharyngeal- Nectar Cup -- Pharyngeal -- Pharyngeal- Nectar Straw -- Pharyngeal -- Pharyngeal- Thin Teaspoon Reduced epiglottic inversion;Reduced airway/laryngeal closure  Pharyngeal -- Pharyngeal- Thin Cup Reduced epiglottic inversion;Reduced airway/laryngeal closure Pharyngeal -- Pharyngeal- Thin Straw Reduced epiglottic inversion;Reduced airway/laryngeal closure;Penetration/Aspiration before swallow;Trace aspiration Pharyngeal Material enters airway, passes BELOW cords without attempt by patient to eject out (silent aspiration) Pharyngeal- Puree Reduced epiglottic inversion;Reduced airway/laryngeal closure Pharyngeal -- Pharyngeal- Mechanical Soft Reduced epiglottic inversion;Reduced airway/laryngeal closure Pharyngeal -- Pharyngeal- Regular -- Pharyngeal -- Pharyngeal- Multi-consistency -- Pharyngeal -- Pharyngeal- Pill Reduced epiglottic inversion;Reduced airway/laryngeal closure Pharyngeal -- Pharyngeal Comment --  CHL IP CERVICAL ESOPHAGEAL PHASE 01/29/2018 Cervical Esophageal Phase WFL Pudding Teaspoon -- Pudding Cup -- Honey Teaspoon -- Honey Cup -- Nectar Teaspoon -- Nectar Cup -- Nectar Straw -- Thin Teaspoon -- Thin Cup -- Thin Straw -- Puree -- Mechanical Soft -- Regular -- Multi-consistency -- Pill -- Cervical Esophageal Comment -- Nathan BatonMary Beth Bardin, MS, CCC-SLP Speech-Language Pathologist [phone] Arlana LindauMary E [name] 01/29/2018, 1:34 PM                Assessment/Plan: S/P Procedure(s) (LRB): MITRAL VALVE (MV) REPLACEMENT USING MAGNA MITRAL EASE PERICARDIAL BIOPROSTHESIS, MODEL 7300TFX, SIZE 29 MM, SERIAL # [id number], EXPIRATION  DATE 2021-03-03. (N/A) CORONARY ARTERY BYPASS GRAFTING (CABG) x 4 WITH ENDOSCOPIC HARVESTING OF RIGHT GREATER SAPHENOUS VEIN: LIMA TO LAD, SVG TO RCA, SVG TO DIAG, SVG TO OM  (N/A) TRANSESOPHAGEAL ECHOCARDIOGRAM (TEE) (N/A) LEFT ATRIAL APPENDAGE OCCLUSION USING ATRICURE ATRICLIP PRO2 LAA EXCLUSION SYSTEM  SIZE 40, LOT # [id number], CAT # [id number], EXP. DATE 2020-04-28 (Left)  1. CV- rate controlled A. Fib, rate in the 50s- continue to hold BB 2. INR 1.21, will repeat coumadin at 5 mg daily, will d/c EPW today before INR is therapeutic 3.Pulm- wean  oxygen as tolerated, CXR has been stable, continue IS 4. Renal-creatinine remains stable, weight is trending down, continue Lasix 5. Dysphagia, continue diet per SLP, will advance once appropriate 6. DM- sugars controlled 7. Dispo- patient stable, in rate controlled A. Fib, will d/c EPW today, continue coumadin, wean oxygen as tolerated, will need SNF at discharge, possibly ready for transfer to 4E soon     Nathan Dandyrin [name] 01/30/2018 11:50 AM   Slowly improving Generally weak  But starting to  walk  With PT possibly had extension of prior CVA Nov 2018 CIR consult pending patient examined and medical record reviewed,agree with above note. Nathan [name] 01/30/2018

## 2018-01-30 NOTE — Plan of Care (Signed)
  Problem: Elimination: Goal: Will not experience complications related to bowel motility Outcome: Progressing   Problem: Activity: Goal: Risk for activity intolerance will decrease Outcome: Progressing   Problem: Cardiac: Goal: Will achieve and/or maintain hemodynamic stability Outcome: Progressing   Problem: Nutrition: Goal: Adequate nutrition will be maintained Outcome: Not Progressing

## 2018-01-31 ENCOUNTER — Inpatient Hospital Stay (HOSPITAL_COMMUNITY): Payer: Medicare Other

## 2018-01-31 DIAGNOSIS — I5042 Chronic combined systolic (congestive) and diastolic (congestive) heart failure: Secondary | ICD-10-CM

## 2018-01-31 DIAGNOSIS — I255 Ischemic cardiomyopathy: Secondary | ICD-10-CM

## 2018-01-31 DIAGNOSIS — D62 Acute posthemorrhagic anemia: Secondary | ICD-10-CM

## 2018-01-31 DIAGNOSIS — Z8673 Personal history of transient ischemic attack (TIA), and cerebral infarction without residual deficits: Secondary | ICD-10-CM

## 2018-01-31 DIAGNOSIS — Z952 Presence of prosthetic heart valve: Secondary | ICD-10-CM

## 2018-01-31 DIAGNOSIS — I4891 Unspecified atrial fibrillation: Secondary | ICD-10-CM

## 2018-01-31 DIAGNOSIS — E871 Hypo-osmolality and hyponatremia: Secondary | ICD-10-CM

## 2018-01-31 DIAGNOSIS — E119 Type 2 diabetes mellitus without complications: Secondary | ICD-10-CM

## 2018-01-31 DIAGNOSIS — I25119 Atherosclerotic heart disease of native coronary artery with unspecified angina pectoris: Secondary | ICD-10-CM

## 2018-01-31 DIAGNOSIS — Z951 Presence of aortocoronary bypass graft: Secondary | ICD-10-CM

## 2018-01-31 DIAGNOSIS — I10 Essential (primary) hypertension: Secondary | ICD-10-CM

## 2018-01-31 LAB — COMPREHENSIVE METABOLIC PANEL
ALT: 17 U/L (ref 0–44)
AST: 24 U/L (ref 15–41)
Albumin: 2.5 g/dL — ABNORMAL LOW (ref 3.5–5.0)
Alkaline Phosphatase: 72 U/L (ref 38–126)
Anion gap: 10 (ref 5–15)
BUN: 25 mg/dL — ABNORMAL HIGH (ref 8–23)
CO2: 32 mmol/L (ref 22–32)
Calcium: 8.5 mg/dL — ABNORMAL LOW (ref 8.9–10.3)
Chloride: 91 mmol/L — ABNORMAL LOW (ref 98–111)
Creatinine, Ser: 0.9 mg/dL (ref 0.61–1.24)
GFR calc Af Amer: >60 mL/min (ref >60)
GFR calc non Af Amer: >60 mL/min (ref >60)
Glucose, Bld: 130 mg/dL — ABNORMAL HIGH (ref 70–99)
Potassium: 4 mmol/L (ref 3.5–5.1)
Sodium: 133 mmol/L — ABNORMAL LOW (ref 135–145)
Total Bilirubin: 1.3 mg/dL — ABNORMAL HIGH (ref 0.3–1.2)
Total Protein: 5.8 g/dL — ABNORMAL LOW (ref 6.5–8.1)

## 2018-01-31 LAB — COOXEMETRY PANEL
Carboxyhemoglobin: 1.6 % — ABNORMAL HIGH (ref 0.5–1.5)
Methemoglobin: 1.7 % — ABNORMAL HIGH (ref 0.0–1.5)
O2 Saturation: 59 %
Total hemoglobin: 8.6 g/dL — ABNORMAL LOW (ref 12.0–16.0)

## 2018-01-31 LAB — ECHOCARDIOGRAM COMPLETE
Height: 71.5 in
Weight: 3224.01 oz

## 2018-01-31 LAB — CBC
HCT: 27.5 % — ABNORMAL LOW (ref 39.0–52.0)
Hemoglobin: 9.1 g/dL — ABNORMAL LOW (ref 13.0–17.0)
MCH: 31.1 pg (ref 26.0–34.0)
MCHC: 33.1 g/dL (ref 30.0–36.0)
MCV: 93.9 fL (ref 78.0–100.0)
Platelets: 180 10*3/uL (ref 150–400)
RBC: 2.93 MIL/uL — ABNORMAL LOW (ref 4.22–5.81)
RDW: 13.3 % (ref 11.5–15.5)
WBC: 9.9 10*3/uL (ref 4.0–10.5)

## 2018-01-31 LAB — GLUCOSE, CAPILLARY
Glucose-Capillary: 127 mg/dL — ABNORMAL HIGH (ref 70–99)
Glucose-Capillary: 145 mg/dL — ABNORMAL HIGH (ref 70–99)
Glucose-Capillary: 146 mg/dL — ABNORMAL HIGH (ref 70–99)
Glucose-Capillary: 142 mg/dL — ABNORMAL HIGH (ref 70–99)

## 2018-01-31 LAB — PROTIME-INR
INR: 1.34
Prothrombin Time: 16.4 seconds — ABNORMAL HIGH (ref 11.4–15.2)

## 2018-01-31 MED ORDER — METOLAZONE 5 MG PO TABS
5.0000 mg | ORAL_TABLET | Freq: Every day | ORAL | Status: DC
  Administered 2018-01-31: 5 mg via ORAL
  Filled 2018-01-31 (×2): qty 1

## 2018-01-31 MED ORDER — WARFARIN SODIUM 7.5 MG PO TABS
7.5000 mg | ORAL_TABLET | Freq: Every day | ORAL | Status: AC
  Administered 2018-01-31: 7.5 mg via ORAL
  Filled 2018-01-31: qty 1

## 2018-01-31 NOTE — Progress Notes (Signed)
CSW spoke with pt wife and provided with current bed offers- tentatively chooses [facility] if pt has to go to SNF but would prefer CIR if they can accept.  CSW will continue to follow and assist with disposition as needed  Burna SisJenna H. Rebecca Cairns, LCSW Clinical Social Worker [phone]

## 2018-01-31 NOTE — Progress Notes (Signed)
Patient ID: Nathan Philipsandleman D Goosby Jr., male   DOB: [birth year], 72 y.o.   MRN: [id number] T[facility] Evening Rounds:  Hemodynamically stable  HR 70  sats 98% on 2L  Urine output ok  Echo today shows EF 20-25%. Mitral valve prosthesis looks ok.

## 2018-01-31 NOTE — Progress Notes (Addendum)
Occupational Therapy Treatment Patient Details Name: Nathan [name]. MRN: [id number] DOB: Sep 13, 1945 Today's Date: 01/31/2018    History of present illness 72 yo admitted 7/29 for CABGx4 and MVR. PMHx: DM, CAD, CHF, ICM, AFib, CVA, HTN   OT comments  Pt progressing towards established OT goals. Pt donning/doffing socks with Min A for recall of sternal precautions and to bring ankles to knees. Pt performing functional mobility to sink with RW and Mod A for power up into standing and then balance during mobility. Pt requiring one rest break during mobility to sink. Pt fatigues quickly and requires increased encouragement for performance. Pt agreeable and appreciative of therapy. Continue to recommend post-acute rehab and will continue to follow acutely as admitted.    Follow Up Recommendations  SNF    Equipment Recommendations  3 in 1 bedside commode    Recommendations for Other Services PT consult    Precautions / Restrictions Precautions Precautions: Fall;Sternal Precaution Booklet Issued: Yes (comment) Precaution Comments: left weak       Mobility Bed Mobility               General bed mobility comments: In recliner upon arrival  Transfers Overall transfer level: Needs assistance Equipment used: Rolling walker (2 wheeled) Transfers: Sit to/from Stand Sit to Stand: Mod assist;+2 safety/equipment         General transfer comment: Mod A for power up into standing. Pt requiring max cues to scott to edge of seat and hand placement.     Balance Overall balance assessment: Needs assistance Sitting-balance support: No upper extremity supported;Feet supported Sitting balance-Leahy Scale: Fair     Standing balance support: Bilateral upper extremity supported;During functional activity Standing balance-Leahy Scale: Poor Standing balance comment: Reliant on UE support                           ADL either performed or assessed with clinical judgement    ADL Overall ADL's : Needs assistance/impaired                     Lower Body Dressing: Minimal assistance;Sit to/from stand;Moderate assistance Lower Body Dressing Details (indicate cue type and reason): Mod A for sit<>stand. Min A for donning/doffing socks while seated at recliner. Cues for adherance to sternal precautions Toilet Transfer: Moderate assistance;+2 for safety/equipment;RW(Simulated to recliner) Toilet Transfer Details (indicate cue type and reason): Pt requiring Mod A for power up into standing and then to maintain standing balance with mobility.          Functional mobility during ADLs: Moderate assistance;Rolling walker;Cueing for safety;Cueing for sequencing General ADL Comments: Pt performing LB dressing and functional mobility with Mod A for standing balance. Pt requiirng Max A to facilitate left LE during mobility as he fatigued.      Vision       Perception     Praxis      Cognition Arousal/Alertness: Awake/alert Behavior During Therapy: WFL for tasks assessed/performed Overall Cognitive Status: History of cognitive impairments - at baseline Area of Impairment: Memory;Following commands;Safety/judgement                     Memory: Decreased short-term memory;Decreased recall of precautions Following Commands: Follows one step commands with increased time;Follows one step commands inconsistently Safety/Judgement: Decreased awareness of safety;Decreased awareness of deficits     General Comments: Pt more alert this session. Requiring increased cues and time thorughout session for ADLs and  funcitonal mobility. During LB dressing, pt requiring Max cues to initate donning of sock.        Exercises     Shoulder Instructions       General Comments BP113/69. SpO2 88%-100% on 3L. HR 60-80    Pertinent Vitals/ Pain       Pain Assessment: Faces Faces Pain Scale: Hurts even more Pain Location: chest, back, left hip Pain Descriptors /  Indicators: Constant;Discomfort;Grimacing Pain Intervention(s): Monitored during session;Limited activity within patient's tolerance;Repositioned  Home Living                                          Prior Functioning/Environment              Frequency  Min 2X/week        Progress Toward Goals  OT Goals(current goals can now be found in the care plan section)  Progress towards OT goals: Progressing toward goals  Acute Rehab OT Goals Patient Stated Goal: return to golf and cooking OT Goal Formulation: With patient Time For Goal Achievement: 02/13/18 Potential to Achieve Goals: Good ADL Goals Pt Will Perform Upper Body Dressing: with set-up;with supervision;sitting Pt Will Perform Lower Body Dressing: with min assist;sit to/from stand Pt Will Transfer to Toilet: with min assist;ambulating;bedside commode Pt Will Perform Toileting - Clothing Manipulation and hygiene: with min assist;sit to/from stand Additional ADL Goal #1: Pt will peform bed mobility with supervision in preparation for ADLs Additional ADL Goal #2: Pt will verbalize sternal precautions with 1-2 VCs  Plan Discharge plan remains appropriate    Co-evaluation                 AM-PAC PT "6 Clicks" Daily Activity     Outcome Measure   Help from another person eating meals?: None Help from another person taking care of personal grooming?: A Little Help from another person toileting, which includes using toliet, bedpan, or urinal?: A Lot Help from another person bathing (including washing, rinsing, drying)?: A Lot Help from another person to put on and taking off regular upper body clothing?: A Little Help from another person to put on and taking off regular lower body clothing?: A Lot 6 Click Score: 16    End of Session Equipment Utilized During Treatment: Gait belt;Rolling walker;Oxygen  OT Visit Diagnosis: Unsteadiness on feet (R26.81);Other abnormalities of gait and mobility  (R26.89);Muscle weakness (generalized) (M62.81);Pain Pain - part of body: (Chest)   Activity Tolerance Patient limited by fatigue;Patient limited by pain   Patient Left in chair;with call bell/phone within reach;with chair alarm set   Nurse Communication Mobility status;Precautions        Time: [id number] OT Time Calculation (min): 37 min  Charges: OT General Charges $OT Visit: 1 Visit OT Treatments $Self Care/Home Management : 23-37 mins  Demarques Pilz MSOT, OTR/L Acute Rehab Pager: [phone] Office: [phone]   Theodoro GristCharis M Marsella Suman 01/31/2018, 3:34 PM

## 2018-01-31 NOTE — Progress Notes (Signed)
  Echocardiogram 2D Echocardiogram has been performed.  Kashmir Lysaght G Holland Kotter 01/31/2018, 2:57 PM

## 2018-01-31 NOTE — Progress Notes (Signed)
Inpatient Rehabilitation  Met with patient at bedside to discuss team's recommendation for IP Rehab.  Shared booklets, discussed insurance, and answered questions.  Patient requested that I check with his insurance as well as call and discuss this with his spouse as well.  Plan to follow for timing of medical readiness, patient/family decision, insurance approval, and IP Rehab bed availability.  Call if questions.    Carmelia Roller., CCC/SLP Admission Coordinator  [place]  Cell [phone]

## 2018-01-31 NOTE — Progress Notes (Signed)
  Speech Language Pathology Treatment: Dysphagia  Patient Details Name: Nathan [name]. MRN: [id number] DOB: 09/26/1945 Today's Date: 01/31/2018 Time: 1400-1420 SLP Time Calculation (min) (ACUTE ONLY): 20 min  Assessment / Plan / Recommendation Clinical Impression  Pt is tolerating thin liquids without signs of aspiration. Reviewed MBS with wife and discussed probability that he will not need to be restricted from straws after d/c from hospital given improving mentation and expected improvement in sensory function well after surgery. Wife has been bringing soft foods from home, reported he tolerated soft chopped foods. We discussed either just letting her make choices from the menu or trying a mechanical soft diet. We chose to try a Dys 3/mech soft diet, demonstrated calling to make order. Pt may upgrade to regular if he and his wife would like to, but hopefully foods will be prepared how he likes them now. No SLP f/u needed will sign off.   HPI HPI: 72 yo admitted 7/29 for CABGx4 and MVR. PMHx: DM, CAD, CHF, ICM, AFib, HTN CVA (04/2017), seen by SLP for cognition only. Intubated <24 hours for procedure, extubated 01/24/18. Pt has had all teeth removed; per RN has had difficulty swallowing pills. Referred for swallow evaluation. Head CT 01/27/18 revealed hypo attenuation with loss of gray-white differentiation in the posterior right frontal region, potentially related to evolution of previously noted right MCA infarct, but acute to subacute ischemia in this region a concern. CXR shows diffuse interstitial infiltrates consistent with pulmonary edema.       SLP Plan  All goals met       Recommendations  Diet recommendations: Dysphagia 3 (mechanical soft);Thin liquid;Regular Liquids provided via: Cup;No straw Medication Administration: Whole meds with liquid Supervision: Patient able to self feed Compensations: Slow rate;Small sips/bites                Plan: All goals met        GO              Herbie Baltimore, MA CCC-SLP [phone]   Lynann Beaver 01/31/2018, 2:39 PM

## 2018-01-31 NOTE — Progress Notes (Addendum)
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 (N/A) CORONARY ARTERY BYPASS GRAFTING (CABG) x 4 WITH ENDOSCOPIC HARVESTING OF RIGHT GREATER SAPHENOUS VEIN: LIMA TO LAD, SVG TO RCA, SVG TO DIAG, SVG TO OM  (N/A) TRANSESOPHAGEAL ECHOCARDIOGRAM (TEE) (N/A) LEFT ATRIAL APPENDAGE OCCLUSION USING ATRICURE ATRICLIP PRO2 LAA EXCLUSION SYSTEM  SIZE 40, LOT # [id number], CAT # [id number], EXP. DATE 2020-04-28 (Left)  Total Length of Stay:  LOS: 8 days   Subjective:  Patient states he cant breath this morning.  Feels short of breath.  Working with PT, and CIR consult is pending.  Objective: Vital signs in last 24 hours: Temp:  [97.4 F (36.3 C)-98.1 F (36.7 C)] 97.4 F (36.3 C) (08/06 0741) Pulse Rate:  [50-65] 61 (08/06 0700) Cardiac Rhythm: Atrial fibrillation (08/06 0400) Resp:  [0-22] 16 (08/06 0700) BP: (84-124)/(43-84) 124/68 (08/06 0700) SpO2:  [91 %-99 %] 94 % (08/06 0700) Weight:  [201 lb 8 oz (91.4 kg)] 201 lb 8 oz (91.4 kg) (08/06 0530)  Filed Weights   01/29/18 0400 01/30/18 0756 01/31/18 0530  Weight: 211 lb 3.2 oz (95.8 kg) 201 lb 8 oz (91.4 kg) 201 lb 8 oz (91.4 kg)    Weight change:    Hemodynamic parameters for last 24 hours: CVP:  [7 mmHg-11 mmHg] 11 mmHg  Intake/Output from previous day: 08/05 0701 - 08/06 0700 In: 991.8 [P.O.:960; I.V.:31.8] Out: 1235 [Urine:1235]  Current Meds: Scheduled Meds: . aspirin EC  325 mg Oral Daily   Or  . aspirin  324 mg Per Tube Daily  . bisacodyl  10 mg Oral Daily   Or  . bisacodyl  10 mg Rectal Daily  . Chlorhexidine Gluconate Cloth  6 each Topical Daily  . enoxaparin (LOVENOX) injection  40 mg Subcutaneous Q24H  . furosemide  40 mg Oral Daily  . guaiFENesin   1,200 mg Oral BID  . insulin aspart  0-24 Units Subcutaneous TID WC  . insulin detemir  15 Units Subcutaneous Daily  . mouth rinse  15 mL Mouth Rinse BID  . pantoprazole  40 mg Oral Daily  . potassium chloride  40 mEq Oral BID  . simvastatin  20 mg Oral QHS  . sodium chloride flush  10-40 mL Intracatheter Q12H  . tamsulosin  0.4 mg Oral QHS  . warfarin   Does not apply Once  . Warfarin - Physician Dosing Inpatient   Does not apply q1800   Continuous Infusions: . lactated ringers Stopped (01/30/18 0750)   PRN Meds:.docusate sodium, ondansetron (ZOFRAN) IV, oxyCODONE, pneumococcal 23 valent vaccine, sodium chloride flush, traMADol  General appearance: alert, cooperative and no distress Heart: regular rate and rhythm Lungs: diminished breath sounds bibasilar Abdomen: soft, non-tender; bowel sounds normal; no masses,  no organomegaly Extremities: edema trace Wound: clean and dry  Lab Results: CBC: Recent Labs    01/30/18 0403 01/31/18 0330  WBC 13.9* 9.9  HGB 9.4* 9.1*  HCT 28.8* 27.5*  PLT 222 180   BMET:  Recent Labs    01/30/18 0403 01/31/18 0330  NA 131* 133*  K 3.9  4.0  CL 89* 91*  CO2 32 32  GLUCOSE 136* 130*  BUN 25* 25*  CREATININE 0.99 0.90  CALCIUM 8.6* 8.5*    CMET: Lab Results  Component Value Date   WBC 9.9 01/31/2018   HGB 9.1 (L) 01/31/2018   HCT 27.5 (L) 01/31/2018   PLT 180 01/31/2018   GLUCOSE 130 (H) 01/31/2018   CHOL 136 07/28/2017   TRIG 101 07/28/2017   HDL 34 (L) 07/28/2017   LDLCALC 82 07/28/2017   ALT 17 01/31/2018   AST 24 01/31/2018   NA 133 (L) 01/31/2018   K 4.0 01/31/2018   CL 91 (L) 01/31/2018   CREATININE 0.90 01/31/2018   BUN 25 (H) 01/31/2018   CO2 32 01/31/2018   TSH 2.140 07/28/2017   INR 1.34 01/31/2018   HGBA1C 5.9 (H) 01/19/2018      PT/INR:  Recent Labs    01/31/18 0330  LABPROT 16.4*  INR 1.34   Radiology: No results found.   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 (N/A) CORONARY ARTERY BYPASS GRAFTING (CABG) x 4 WITH ENDOSCOPIC HARVESTING OF RIGHT GREATER SAPHENOUS VEIN: LIMA TO LAD, SVG TO RCA, SVG TO DIAG, SVG TO OM  (N/A) TRANSESOPHAGEAL ECHOCARDIOGRAM (TEE) (N/A) LEFT ATRIAL APPENDAGE OCCLUSION USING ATRICURE ATRICLIP PRO2 LAA EXCLUSION SYSTEM  SIZE 40, LOT # [id number], CAT # [id number], EXP. DATE 2020-04-28 (Left)  1. CV- A. Fib, rate in the 50s, BP controlled 2. INR 1.34, will repeat dose of 5 mg this evening, if no response increase dosage tomorrow, continue Lovenox 3. Pulm- new complaints of dyspnea, will repeat CXR if significant fluid will need Thoracentesis 4. Renal- creatinine stable, weight is trending down, continue Lasix 5. DM-CBGs controlled, continue current insulin regimen 6. Deconditioning- previous stroke, PT recs SNF placement, CIR consult has been placed 7. DIspo- patient with new complaint shortness of breath today, will get CXR if significant effusion will need Thoracentesis, on DVT prophylaxis with Lovenox, continue coumadin as INR not therapeutic, I think PE is low on list of source of dyspnea, remain in ICU today     Erin Barrett 01/31/2018 8:16 AM   CXR clear, mild ankle edema HR junctional 60/min Co-ox off mil 60% Echo pending Needs more PT

## 2018-01-31 NOTE — Consult Note (Signed)
Physical Medicine and Rehabilitation Consult Reason for Consult: Decreased functional mobility Referring Physician: Dr. Zenaida Niece trite   HPI: Nathan [name]. is a 72 y.o. right-handed male with history of atrial fibrillation, diastolic and systolic congestive heart failure, diabetes mellitus, hypertension, CVA November 2018 maintained on aspirin, remote tobacco abuse, CAD/ischemic cardiomyopathy with severe mitral regurgitation identified during work-up of CVA with cardiac catheterization showing severe three-vessel disease.  Per chart review and patient, patient lives with spouse.  Was independent prior to admission as he drives likes to cook and golf.  Wife did note some mild memory deficits prior to admission.  One level home with one-step to entry.  Presented 01/23/2018 after recovery from CVA for evaluation of CAD and anticipation for CABG.  During work-up for CAD noted multiple necrotic teeth with multiple extractions.  Patient cleared for surgery and underwent CABG x4 as well as mitral valve replacement 01/23/2018 per Dr. Zenaida Niece trite.  CT head on 8/2 reviewed, ?CVA. Sternal precautions as indicated.  Patient remains on aspirin after CABG as well as the addition of Coumadin.  Subcutaneous Lovenox for DVT prophylaxis.  Acute blood loss anemia 9.1 as well as mild hyponatremia 133.  Physical and occupational therapy evaluations completed and MD has requested physical medicine rehab consult.   Review of Systems  Constitutional: Negative for chills and fever.  HENT: Negative for hearing loss.   Eyes: Negative for blurred vision and double vision.  Gastrointestinal: Positive for constipation. Negative for nausea and vomiting.  Genitourinary: Positive for urgency. Negative for dysuria, flank pain and hematuria.  Skin: Negative for rash.  Neurological: Positive for weakness.  Psychiatric/Behavioral: Positive for memory loss.  All other systems reviewed and are negative.  Past Medical  History:  Diagnosis Date  . Atrial fibrillation with RVR (HCC) 05/01/2017  . Cardiomyopathy (HCC) 05/01/2017  . CHF (congestive heart failure) (HCC)   . 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 Coronary artery disease   . DM (diabetes mellitus) type 2, uncontrolled, with ketoacidosis (HCC) 05/01/2017  . Essential hypertension 11/26/2016  . History of kidney stones   . HLD (hyperlipidemia) 05/01/2017  . IVCD (intraventricular conduction defect) 04/24/2015  . Mitral valve regurgitation   . Mixed dyslipidemia 11/26/2016  . 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 BRAIN SURGERY    . CARDIAC CATHETERIZATION    . CORONARY ARTERY BYPASS GRAFT N/A 01/23/2018   Procedure: CORONARY ARTERY BYPASS GRAFTING (CABG) x 4 WITH ENDOSCOPIC HARVESTING OF RIGHT GREATER SAPHENOUS VEIN: LIMA TO LAD, SVG TO RCA, SVG TO DIAG, SVG TO OM ;  Surgeon: Kerin Perna, MD;  Location: [facility] OR;  Service: Open Heart Surgery;  Laterality: N/A;  . IR PERCUTANEOUS ART THROMBECTOMY/INFUSION INTRACRANIAL INC DIAG ANGIO  04/29/2017  . IR RADIOLOGIST EVAL & MGMT  05/17/2017  . IR US GUIDE VASC ACCESS LEFT  04/29/2017  . IR US GUIDE VASC ACCESS RIGHT  04/29/2017  . LEFT ATRIAL APPENDAGE OCCLUSION Left 01/23/2018   Procedure: LEFT ATRIAL APPENDAGE OCCLUSION USING ATRICURE ATRICLIP PRO2 LAA EXCLUSION SYSTEM  SIZE 40, LOT # [id number], CAT # [id number], EXP. DATE 2020-04-28;  Surgeon: Donata Clay, Theron Arista, MD;  Location: [facility] OR;  Service: Open Heart Surgery;  Laterality: Left;  . MITRAL VALVE REPLACEMENT N/A 01/23/2018   Procedure: MITRAL VALVE (MV) REPLACEMENT USING MAGNA MITRAL EASE PERICARDIAL BIOPROSTHESIS, MODEL 7300TFX, SIZE 29 MM, SERIAL # [id number], EXPIRATION  DATE 2021-03-03.;  Surgeon:  Kerin Perna, MD;  Location: [facility] OR;  Service: Open Heart Surgery;  Laterality: N/A;  . MULTIPLE EXTRACTIONS WITH ALVEOLOPLASTY N/A 12/26/2017   Procedure: Extraction of tooth #'s  4,6-11, 18 -27, 30, and 31 with alveoloplasty;  Surgeon: [name], DDS;  Location: [facility] OR;  Service: Oral Surgery;  Laterality: N/A;  . RADIOLOGY WITH ANESTHESIA N/A 04/29/2017   Procedure: IR WITH ANESTHESIA;  Surgeon: Radiologist, Medication, MD;  Location: [facility] OR;  Service: Radiology;  Laterality: N/A;  . RIGHT/LEFT HEART CATH AND CORONARY ANGIOGRAPHY N/A 08/18/2017   Procedure: RIGHT/LEFT HEART CATH AND CORONARY ANGIOGRAPHY;  Surgeon: [name], MD;  Location: [facility] INVASIVE CV LAB;  Service: Cardiovascular;  Laterality: N/A;  . TEE WITHOUT CARDIOVERSION N/A 09/22/2017   Procedure: TRANSESOPHAGEAL ECHOCARDIOGRAM (TEE);  Surgeon: [name], MD;  Location: [facility] ENDOSCOPY;  Service: Cardiovascular;  Laterality: N/A;  . TEE WITHOUT CARDIOVERSION N/A 01/23/2018   Procedure: TRANSESOPHAGEAL ECHOCARDIOGRAM (TEE);  Surgeon: Donata Clay, Theron Arista, MD;  Location: [facility] OR;  Service: Open Heart Surgery;  Laterality: N/A;  . TONSILLECTOMY     Family History  Problem Relation Age of Onset  . Hypertension Father   . Diabetes Father    Social History:  reports that he quit smoking about 25 years ago. He has never used smokeless tobacco. He reports that he drinks about 1.8 oz of alcohol per week. He reports that he does not use drugs. Allergies:  Allergies  Allergen Reactions  . Fentanyl Shortness Of Breath  . Promethazine Hcl Anaphylaxis and Other (See Comments)    Cardiac arrest  . Foeniculum Vulgare Other (See Comments)    Fennel bulbs--nausea only  . Metformin And Related Diarrhea  . Penicillins Nausea Only    Has patient had a PCN reaction causing immediate rash, facial/tongue/throat swelling, SOB or lightheadedness with hypotension: no Has patient had a PCN reaction causing severe rash involving mucus membranes or skin necrosis: no Has patient had a PCN reaction that required hospitalization: no Has patient had a PCN reaction occurring within the last 10 years: yes If all of the above  answers are "NO", then may proceed with Cephalosporin use.   . Sulfa Antibiotics Other (See Comments)    G.I. Upset   Medications Prior to Admission  Medication Sig Dispense Refill  . amiodarone (PACERONE) 200 MG tablet Take 1 tablet (200 mg total) by mouth daily. 90 tablet 3  . aspirin EC 81 MG tablet Take 81 mg by mouth daily.    . carvedilol (COREG) 6.25 MG tablet Take 1 tablet (6.25 mg total) by mouth 2 (two) times daily. 60 tablet 3  . glipiZIDE (GLUCOTROL XL) 10 MG 24 hr tablet Take 10 mg by mouth daily with breakfast.   3  . lisinopril (PRINIVIL,ZESTRIL) 5 MG tablet Take 2.5 mg by mouth daily.    . repaglinide (PRANDIN) 0.5 MG tablet Take 1 tablet by mouth 3 (three) times daily with meals.   3  . simvastatin (ZOCOR) 40 MG tablet Take 0.5 tablets (20 mg total) by mouth at bedtime. 45 tablet 1  . tamsulosin (FLOMAX) 0.4 MG CAPS capsule Take 0.4 mg by mouth at bedtime.   3  . acetaminophen (TYLENOL) 325 MG tablet Take 650 mg by mouth every 6 (six) hours as needed (for pain.).     [name] cyclobenzaprine (FLEXERIL) 10 MG tablet Take 10 mg by mouth 2 (two) times daily as needed.  0    Home: Home Living Family/patient expects to be discharged to::  Private residence Living Arrangements: Spouse/significant other Available Help at Discharge: Family, Available 24 hours/day Type of Home: House Home Access: Level entry Home Layout: One level Bathroom Shower/Tub: Engineer, manufacturing systems: Standard Home Equipment: None  Functional History: Prior Function Level of Independence: Independent Comments: drives, likes to cook and golf, wife states pt was having memory deficits prior to admission Functional Status:  Mobility: Bed Mobility Overal bed mobility: Needs Assistance Bed Mobility: Rolling, Sidelying to Sit Rolling: Mod assist Sidelying to sit: Max assist, HOB elevated General bed mobility comments: In recliner upon arrival Transfers Overall transfer level: Needs  assistance Equipment used: Rolling walker (2 wheeled) Transfers: Sit to/from Stand Sit to Stand: Mod assist, +2 safety/equipment Stand pivot transfers: Max assist, +2 physical assistance General transfer comment: mod assist x 2 to stand from chair x 3 trials with left knee blocked, cues for hands on knees and assist of belt with increased time to rise and stabilize Ambulation/Gait Ambulation/Gait assistance: Mod assist, +2 safety/equipment Gait Distance (Feet): 5 Feet Assistive device: Rolling walker (2 wheeled) Gait Pattern/deviations: Step-to pattern, Decreased stride length, Trunk flexed, Narrow base of support General Gait Details: pt with initial left lean in standing with cues and assist for midline posture with assist to control RW and to advance and place LLE to maintain wider BOB and improve position to midline. Pt walked 2', 3', 5' with close chair follow and seated rest after each trial. Pt limited by fatigue despite stable vital signs and max encouragement Gait velocity interpretation: <1.31 ft/sec, indicative of household ambulator    ADL: ADL Overall ADL's : Needs assistance/impaired Eating/Feeding: Independent, Sitting Grooming: Set up, Supervision/safety, Sitting Upper Body Bathing: Minimal assistance, Sitting Lower Body Bathing: Maximal assistance, Sit to/from stand Lower Body Bathing Details (indicate cue type and reason): Max A for sit<>Stand.  Upper Body Dressing : Minimal assistance, Sitting Lower Body Dressing: Maximal assistance, Sit to/from stand Lower Body Dressing Details (indicate cue type and reason): Pt able to bring his ankles to his knees for donning socks. Requiring Min A to facilitate movement towards knees and then adjust socks position. Max A for sit<>stand Toilet Transfer: Maximal assistance, RW(Simulated in room. Sit<>stand only) Functional mobility during ADLs: Maximal assistance, +2 for safety/equipment, Rolling walker, +2 for physical  assistance  Cognition: Cognition Overall Cognitive Status: Impaired/Different from baseline Orientation Level: Oriented X4 Cognition Arousal/Alertness: Awake/alert Behavior During Therapy: Flat affect Overall Cognitive Status: Impaired/Different from baseline Area of Impairment: Memory, Following commands, Safety/judgement Orientation Level: Disoriented to, Time Memory: Decreased recall of precautions Following Commands: Follows one step commands inconsistently, Follows one step commands with increased time Safety/Judgement: Decreased awareness of deficits, Decreased awareness of safety General Comments: pt with tendency for eyes closed at rest, pt unable to recall sternal precautions other than keeping arms in  Blood pressure (!) 84/64, pulse (!) 50, temperature 97.7 F (36.5 C), temperature source Axillary, resp. rate 15, height 5' 11.5" (1.816 m), weight 91.4 kg (201 lb 8 oz), SpO2 98 %. Physical Exam  Vitals reviewed. Constitutional: He appears well-developed and well-nourished.  HENT:  Head: Normocephalic and atraumatic.  Eyes: EOM are normal. Right eye exhibits no discharge. Left eye exhibits no discharge.  Neck: Normal range of motion. Neck supple. No thyromegaly present.  Cardiovascular:  Irregularly irregular  Respiratory: Effort normal and breath sounds normal. No respiratory distress.  GI: Soft. Bowel sounds are normal. He exhibits no distension.  Musculoskeletal:  No edema or tenderness in extremities  Neurological:  Alert and oriented 3 Falls  asleep during exam Follows simple commands.   Fair medical historian Motor: 4 -/5 grossly throughout (? Participation)  Skin:  Midline chest incision clean and dry  Psychiatric: His affect is blunt. His speech is delayed. He is slowed.    Results for orders placed or performed during the hospital encounter of 01/23/18 (from the past 24 hour(s))  Glucose, capillary     Status: Abnormal   Collection Time: 01/30/18  6:42  AM  Result Value Ref Range   Glucose-Capillary 146 (H) 70 - 99 mg/dL   Comment 1 Capillary Specimen    Comment 2 Notify RN   Glucose, capillary     Status: Abnormal   Collection Time: 01/30/18 12:08 PM  Result Value Ref Range   Glucose-Capillary 151 (H) 70 - 99 mg/dL  Glucose, capillary     Status: Abnormal   Collection Time: 01/30/18  3:39 PM  Result Value Ref Range   Glucose-Capillary 145 (H) 70 - 99 mg/dL   Comment 1 Capillary Specimen   Glucose, capillary     Status: Abnormal   Collection Time: 01/30/18  9:09 PM  Result Value Ref Range   Glucose-Capillary 126 (H) 70 - 99 mg/dL   Comment 1 Notify RN   Comprehensive metabolic panel     Status: Abnormal   Collection Time: 01/31/18  3:30 AM  Result Value Ref Range   Sodium 133 (L) 135 - 145 mmol/L   Potassium 4.0 3.5 - 5.1 mmol/L   Chloride 91 (L) 98 - 111 mmol/L   CO2 32 22 - 32 mmol/L   Glucose, Bld 130 (H) 70 - 99 mg/dL   BUN 25 (H) 8 - 23 mg/dL   Creatinine, Ser 0.98 0.61 - 1.24 mg/dL   Calcium 8.5 (L) 8.9 - 10.3 mg/dL   Total Protein 5.8 (L) 6.5 - 8.1 g/dL   Albumin 2.5 (L) 3.5 - 5.0 g/dL   AST 24 15 - 41 U/L   ALT 17 0 - 44 U/L   Alkaline Phosphatase 72 38 - 126 U/L   Total Bilirubin 1.3 (H) 0.3 - 1.2 mg/dL   GFR calc non Af Amer >60 >60 mL/min   GFR calc Af Amer >60 >60 mL/min   Anion gap 10 5 - 15  CBC     Status: Abnormal   Collection Time: 01/31/18  3:30 AM  Result Value Ref Range   WBC 9.9 4.0 - 10.5 K/uL   RBC 2.93 (L) 4.22 - 5.81 MIL/uL   Hemoglobin 9.1 (L) 13.0 - 17.0 g/dL   HCT 11.9 (L) 14.7 - 82.9 %   MCV 93.9 78.0 - 100.0 fL   MCH 31.1 26.0 - 34.0 pg   MCHC 33.1 30.0 - 36.0 g/dL   RDW 56.2 13.0 - 86.5 %   Platelets 180 150 - 400 K/uL  Protime-INR     Status: Abnormal   Collection Time: 01/31/18  3:30 AM  Result Value Ref Range   Prothrombin Time 16.4 (H) 11.4 - 15.2 seconds   INR 1.34    Dg Chest Port 1 View  Result Date: 01/29/2018 CLINICAL DATA:  Postop from CABG and mitral valve  replacement. EXAM: PORTABLE CHEST 1 VIEW COMPARISON:  01/28/2018 FINDINGS: Stable cardiomegaly. Previous CABG and mitral valve replacement. Right arm PICC line remains in appropriate position. No pneumothorax visualized. Diffuse interstitial infiltrates show no significant change, consistent with pulmonary edema. No significant pleural effusion or pulmonary consolidation. IMPRESSION: Stable cardiomegaly and diffuse pulmonary edema. Electronically Signed   By: Jonny Ruiz  Eppie Gibson M.D.   On: 01/29/2018 07:27   Dg Swallowing Func-speech Pathology  Result Date: 01/29/2018 Objective Swallowing Evaluation: Type of Study: MBS-Modified Barium Swallow Study  Patient Details Name: Aamir Mclinden. MRN: [id number] Date of Birth: 1945/08/28 Today's Date: 01/29/2018 Time: SLP Start Time (ACUTE ONLY): 1224 -SLP Stop Time (ACUTE ONLY): 1244 SLP Time Calculation (min) (ACUTE ONLY): 20 min Past Medical History: Past Medical History: Diagnosis Date . Atrial fibrillation with RVR (HCC) 05/01/2017 . Cardiomyopathy (HCC) 05/01/2017 . CHF (congestive heart failure) (HCC)  . 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 Coronary artery disease  . DM (diabetes mellitus) type 2, uncontrolled, with ketoacidosis (HCC) 05/01/2017 . Essential hypertension 11/26/2016 . History of kidney stones  . HLD (hyperlipidemia) 05/01/2017 . IVCD (intraventricular conduction defect) 04/24/2015 . Mitral valve regurgitation  . Mixed dyslipidemia 11/26/2016 . Stroke (cerebrum) (HCC) 04/29/2017 Past Surgical History: Past Surgical History: Procedure Laterality Date . BRAIN SURGERY   . CARDIAC CATHETERIZATION   . CORONARY ARTERY BYPASS GRAFT N/A 01/23/2018  Procedure: CORONARY ARTERY BYPASS GRAFTING (CABG) x 4 WITH ENDOSCOPIC HARVESTING OF RIGHT GREATER SAPHENOUS VEIN: LIMA TO LAD, SVG TO RCA, SVG TO DIAG, SVG TO OM ;  Surgeon: Kerin Perna, MD;  Location: [facility] OR;  Service: Open Heart Surgery;  Laterality: N/A; . IR PERCUTANEOUS ART  THROMBECTOMY/INFUSION INTRACRANIAL INC DIAG ANGIO  04/29/2017 . IR RADIOLOGIST EVAL & MGMT  05/17/2017 . IR US GUIDE VASC ACCESS LEFT  04/29/2017 . IR US GUIDE VASC ACCESS RIGHT  04/29/2017 . LEFT ATRIAL APPENDAGE OCCLUSION Left 01/23/2018  Procedure: LEFT ATRIAL APPENDAGE OCCLUSION USING ATRICURE ATRICLIP PRO2 LAA EXCLUSION SYSTEM  SIZE 40, LOT # [id number], CAT # [id number], EXP. DATE 2020-04-28;  Surgeon: Donata Clay, Theron Arista, MD;  Location: [facility] OR;  Service: Open Heart Surgery;  Laterality: Left; . MITRAL VALVE REPLACEMENT N/A 01/23/2018  Procedure: MITRAL VALVE (MV) REPLACEMENT USING MAGNA MITRAL EASE PERICARDIAL BIOPROSTHESIS, MODEL 7300TFX, SIZE 29 MM, SERIAL # [id number], EXPIRATION  DATE 2021-03-03.;  Surgeon: Kerin Perna, MD;  Location: [facility] OR;  Service: Open Heart Surgery;  Laterality: N/A; . MULTIPLE EXTRACTIONS WITH ALVEOLOPLASTY N/A 12/26/2017  Procedure: Extraction of tooth #'s 4,6-11, 18 -27, 30, and 31 with alveoloplasty;  Surgeon: [name], DDS;  Location: [facility] OR;  Service: Oral Surgery;  Laterality: N/A; . RADIOLOGY WITH ANESTHESIA N/A 04/29/2017  Procedure: IR WITH ANESTHESIA;  Surgeon: Radiologist, Medication, MD;  Location: [facility] OR;  Service: Radiology;  Laterality: N/A; . RIGHT/LEFT HEART CATH AND CORONARY ANGIOGRAPHY N/A 08/18/2017  Procedure: RIGHT/LEFT HEART CATH AND CORONARY ANGIOGRAPHY;  Surgeon: [name], MD;  Location: [facility] INVASIVE CV LAB;  Service: Cardiovascular;  Laterality: N/A; . TEE WITHOUT CARDIOVERSION N/A 09/22/2017  Procedure: TRANSESOPHAGEAL ECHOCARDIOGRAM (TEE);  Surgeon: [name], MD;  Location: [facility] ENDOSCOPY;  Service: Cardiovascular;  Laterality: N/A; . TEE WITHOUT CARDIOVERSION N/A 01/23/2018  Procedure: TRANSESOPHAGEAL ECHOCARDIOGRAM (TEE);  Surgeon: Donata Clay, Theron Arista, MD;  Location: [facility] OR;  Service: Open Heart Surgery;  Laterality: N/A; . TONSILLECTOMY   HPI: 72 yo admitted 7/29 for CABGx4 and MVR. PMHx: DM, CAD, CHF, ICM, AFib, HTN CVA (04/2017), seen by SLP for  cognition only. Intubated <24 hours for procedure, extubated 01/24/18. Pt has had all teeth removed; per RN has had difficulty swallowing pills. Referred for swallow evaluation. Head CT 01/27/18 revealed hypo attenuation with loss of gray-white differentiation in the posterior right frontal region, potentially related to evolution of previously noted right MCA infarct, but acute to subacute ischemia  in this region a concern. CXR shows diffuse interstitial infiltrates consistent with pulmonary edema.  Subjective: Pt arrives to fluoro with RN Assessment / Plan / Recommendation CHL IP CLINICAL IMPRESSIONS 01/29/2018 Clinical Impression Patient presents with moderate oral and mild pharyngeal dysphagia, with trace silent aspiration of thin liquid x1 due to premature spillage, decreased airway/laryngeal closure. Oral stage characterized by impaired mastication and decreased anterior to posterior transit, which pt attributes to recent tooth extraction. Pt reporting, "I can't swallow" with soft solid, despite having already transferred bolus to base of tongue/valleculae and was eventually able to initiate a swallow with encouragement; minimal base of tongue residue clears with liquid wash. Decreased bolus cohesion with thin/barium tablet; pt retained tablet on lingual surface and ultimately achieved transit with puree. There is discoordination with larger sips, resulting in premature spillage. Pharyngeal stage characterized by reduced epiglottic deflection due to structure of the cervical spine and decreased airway/laryngeal closure. With larger straw sip of thin liquids, there was single instance of penetration and trace aspiration before the swallow. No penetration/aspiration with smaller cup sips or with straw sips with chin tuck. Potential for aspiration events given decreased laryngeal closure and discoordination/premature spillage with larger sips. Would continue dys 1, thin liquids but with no straws, small cup sips only.  Meds whole in puree or may crush if pt has difficulty with oral transit. Pt likely to require assistance for feeding. SLP will follow up.  SLP Visit Diagnosis Dysphagia, oral phase (R13.11);Dysphagia, pharyngeal phase (R13.13) Attention and concentration deficit following -- Frontal lobe and executive function deficit following -- Impact on safety and function Mild aspiration risk   CHL IP TREATMENT RECOMMENDATION 01/29/2018 Treatment Recommendations Therapy as outlined in treatment plan below   Prognosis 01/29/2018 Prognosis for Safe Diet Advancement Good Barriers to Reach Goals Other (Comment) Barriers/Prognosis Comment -- CHL IP DIET RECOMMENDATION 01/29/2018 SLP Diet Recommendations Dysphagia 1 (Puree) solids;Thin liquid Liquid Administration via Cup;No straw Medication Administration Whole meds with puree Compensations Slow rate;Small sips/bites Postural Changes --   CHL IP OTHER RECOMMENDATIONS 01/29/2018 Recommended Consults -- Oral Care Recommendations Oral care BID Other Recommendations --   CHL IP FOLLOW UP RECOMMENDATIONS 01/29/2018 Follow up Recommendations Skilled Nursing facility   [facility] IP FREQUENCY AND DURATION 01/29/2018 Speech Therapy Frequency (ACUTE ONLY) min 1 x/week Treatment Duration 2 weeks      CHL IP ORAL PHASE 01/29/2018 Oral Phase Impaired Oral - Pudding Teaspoon -- Oral - Pudding Cup -- Oral - Honey Teaspoon -- Oral - Honey Cup -- Oral - Nectar Teaspoon -- Oral - Nectar Cup -- Oral - Nectar Straw -- Oral - Thin Teaspoon WFL Oral - Thin Cup Left anterior bolus loss;Decreased bolus cohesion;Premature spillage Oral - Thin Straw Decreased bolus cohesion;Premature spillage Oral - Puree Reduced posterior propulsion Oral - Mech Soft Impaired mastication;Reduced posterior propulsion;Delayed oral transit;Decreased bolus cohesion;Premature spillage Oral - Regular -- Oral - Multi-Consistency Reduced posterior propulsion;Decreased bolus cohesion;Delayed oral transit;Premature spillage Oral - Pill Decreased bolus  cohesion;Delayed oral transit;Reduced posterior propulsion Oral Phase - Comment --  CHL IP PHARYNGEAL PHASE 01/29/2018 Pharyngeal Phase Impaired Pharyngeal- Pudding Teaspoon -- Pharyngeal -- Pharyngeal- Pudding Cup -- Pharyngeal -- Pharyngeal- Honey Teaspoon -- Pharyngeal -- Pharyngeal- Honey Cup -- Pharyngeal -- Pharyngeal- Nectar Teaspoon -- Pharyngeal -- Pharyngeal- Nectar Cup -- Pharyngeal -- Pharyngeal- Nectar Straw -- Pharyngeal -- Pharyngeal- Thin Teaspoon Reduced epiglottic inversion;Reduced airway/laryngeal closure Pharyngeal -- Pharyngeal- Thin Cup Reduced epiglottic inversion;Reduced airway/laryngeal closure Pharyngeal -- Pharyngeal- Thin Straw Reduced epiglottic inversion;Reduced airway/laryngeal closure;Penetration/Aspiration before swallow;Trace  aspiration Pharyngeal Material enters airway, passes BELOW cords without attempt by patient to eject out (silent aspiration) Pharyngeal- Puree Reduced epiglottic inversion;Reduced airway/laryngeal closure Pharyngeal -- Pharyngeal- Mechanical Soft Reduced epiglottic inversion;Reduced airway/laryngeal closure Pharyngeal -- Pharyngeal- Regular -- Pharyngeal -- Pharyngeal- Multi-consistency -- Pharyngeal -- Pharyngeal- Pill Reduced epiglottic inversion;Reduced airway/laryngeal closure Pharyngeal -- Pharyngeal Comment --  CHL IP CERVICAL ESOPHAGEAL PHASE 01/29/2018 Cervical Esophageal Phase WFL Pudding Teaspoon -- Pudding Cup -- Honey Teaspoon -- Honey Cup -- Nectar Teaspoon -- Nectar Cup -- Nectar Straw -- Thin Teaspoon -- Thin Cup -- Thin Straw -- Puree -- Mechanical Soft -- Regular -- Multi-consistency -- Pill -- Cervical Esophageal Comment -- Rondel BatonMary Beth Bardin, MS, CCC-SLP Speech-Language Pathologist [phone] Arlana LindauMary E Bardin 01/29/2018, 1:34 PM               Assessment/Plan: Diagnosis: debility Labs and images (see above) independently reviewed.  Records reviewed and summated above.  1. Does the need for close, 24 hr/day medical supervision in concert with the  patient's rehab needs make it unreasonable for this patient to be served in a less intensive setting? Potentially  2. Co-Morbidities requiring supervision/potential complications: atrial fibrillation (Continue meds, monitor heart rate with increased physical activity), diastolic and systolic CHF (Monitor in accordance with increased physical activity and avoid UE resistance excercises), diabetes mellitus (Monitor in accordance with exercise and adjust meds as necessary), HTN (monitor and provide prns in accordance with increased physical exertion and pain), CVA November 2018 maintained on aspirin, remote tobacco abuse, Acute blood loss anemia (transfuse if necessary to ensure appropriate perfusion for increased activity tolerance), hyponatremia (continue to monitor, treated necessary) 3. Due to safety, skin/wound care, pain management and patient education, does the patient require 24 hr/day rehab nursing? Yes 4. Does the patient require coordinated care of a physician, rehab nurse, PT (1-2 hrs/day, 5 days/week) and OT (1-2 hrs/day, 5 days/week) to address physical and functional deficits in the context of the above medical diagnosis(es)? Potentially Addressing deficits in the following areas: balance, endurance, locomotion, strength, transferring, bathing, dressing, toileting and psychosocial support 5. Can the patient actively participate in an intensive therapy program of at least 3 hrs of therapy per day at least 5 days per week? Yes 6. The potential for patient to make measurable gains while on inpatient rehab is good 7. Anticipated functional outcomes upon discharge from inpatient rehab are supervision and min assist  with PT, supervision and min assist with OT, n/a with SLP. 8. Estimated rehab length of stay to reach the above functional goals is: 10-14 days. 9. Anticipated D/C setting: TBD 10. Anticipated post D/C treatments: HH therapy and Home excercise program 11. Overall Rehab/Functional  Prognosis: good  RECOMMENDATIONS: This patient's condition is appropriate for continued rehabilitative care in the following setting: Patient states he would like to discuss discharge options with his wife. He will likely need some level of assistance/supervision at discharge. Unclear if this is available at this time. If deficits are worse from baseline consider MRI per last CT report. Patient has agreed to participate in recommended program. Potentially Note that insurance prior authorization may be required for reimbursement for recommended care.  Comment: Rehab Admissions Coordinator to follow up.   I have personally performed a face to face diagnostic evaluation, including, but not limited to relevant history and physical exam findings, of this patient and developed relevant assessment and plan.  Additionally, I have reviewed and concur with the physician assistant's documentation above.   Maryla MorrowAnkit Patel, MD, ABPMR Mcarthur Rossettianiel J  Angiulli, PA-C 01/31/2018

## 2018-02-01 DIAGNOSIS — E440 Moderate protein-calorie malnutrition: Secondary | ICD-10-CM

## 2018-02-01 HISTORY — DX: Moderate protein-calorie malnutrition: E44.0

## 2018-02-01 LAB — BASIC METABOLIC PANEL
Anion gap: 10 (ref 5–15)
BUN: 26 mg/dL — ABNORMAL HIGH (ref 8–23)
CO2: 31 mmol/L (ref 22–32)
Calcium: 8.3 mg/dL — ABNORMAL LOW (ref 8.9–10.3)
Chloride: 91 mmol/L — ABNORMAL LOW (ref 98–111)
Creatinine, Ser: 0.88 mg/dL (ref 0.61–1.24)
GFR calc Af Amer: >60 mL/min (ref >60)
GFR calc non Af Amer: >60 mL/min (ref >60)
Glucose, Bld: 148 mg/dL — ABNORMAL HIGH (ref 70–99)
Potassium: 3.7 mmol/L (ref 3.5–5.1)
Sodium: 132 mmol/L — ABNORMAL LOW (ref 135–145)

## 2018-02-01 LAB — CBC
HCT: 27.7 % — ABNORMAL LOW (ref 39.0–52.0)
Hemoglobin: 8.9 g/dL — ABNORMAL LOW (ref 13.0–17.0)
MCH: 30.2 pg (ref 26.0–34.0)
MCHC: 32.1 g/dL (ref 30.0–36.0)
MCV: 93.9 fL (ref 78.0–100.0)
Platelets: 181 10*3/uL (ref 150–400)
RBC: 2.95 MIL/uL — ABNORMAL LOW (ref 4.22–5.81)
RDW: 13.3 % (ref 11.5–15.5)
WBC: 9.6 10*3/uL (ref 4.0–10.5)

## 2018-02-01 LAB — GLUCOSE, CAPILLARY
Glucose-Capillary: 150 mg/dL — ABNORMAL HIGH (ref 70–99)
Glucose-Capillary: 158 mg/dL — ABNORMAL HIGH (ref 70–99)
Glucose-Capillary: 168 mg/dL — ABNORMAL HIGH (ref 70–99)

## 2018-02-01 LAB — COOXEMETRY PANEL
Carboxyhemoglobin: 1.9 % — ABNORMAL HIGH (ref 0.5–1.5)
Methemoglobin: 1.1 % (ref 0.0–1.5)
O2 Saturation: 72.5 %
Total hemoglobin: 9.4 g/dL — ABNORMAL LOW (ref 12.0–16.0)

## 2018-02-01 LAB — PROTIME-INR
INR: 1.72
Prothrombin Time: 20 seconds — ABNORMAL HIGH (ref 11.4–15.2)

## 2018-02-01 MED ORDER — SPIRONOLACTONE 12.5 MG HALF TABLET
12.5000 mg | ORAL_TABLET | Freq: Every day | ORAL | Status: DC
  Administered 2018-02-01 – 2018-02-03 (×3): 12.5 mg via ORAL
  Filled 2018-02-01 (×3): qty 1

## 2018-02-01 MED ORDER — ASPIRIN 81 MG PO CHEW
81.0000 mg | CHEWABLE_TABLET | Freq: Every day | ORAL | Status: DC
  Administered 2018-02-03: 81 mg via ORAL
  Filled 2018-02-01: qty 1

## 2018-02-01 MED ORDER — ASPIRIN EC 81 MG PO TBEC
81.0000 mg | DELAYED_RELEASE_TABLET | Freq: Every day | ORAL | Status: DC
  Administered 2018-02-01 – 2018-02-02 (×2): 81 mg via ORAL
  Filled 2018-02-01: qty 1

## 2018-02-01 MED ORDER — ROSUVASTATIN CALCIUM 20 MG PO TABS
20.0000 mg | ORAL_TABLET | Freq: Every day | ORAL | Status: DC
  Administered 2018-02-01 – 2018-02-02 (×2): 20 mg via ORAL
  Filled 2018-02-01 (×2): qty 1

## 2018-02-01 MED ORDER — LOSARTAN POTASSIUM 25 MG PO TABS
12.5000 mg | ORAL_TABLET | Freq: Every day | ORAL | Status: DC
  Administered 2018-02-01 – 2018-02-02 (×2): 12.5 mg via ORAL
  Filled 2018-02-01 (×2): qty 1

## 2018-02-01 MED ORDER — WARFARIN SODIUM 5 MG PO TABS
5.0000 mg | ORAL_TABLET | Freq: Every day | ORAL | Status: AC
  Administered 2018-02-01: 5 mg via ORAL
  Filled 2018-02-01: qty 1

## 2018-02-01 NOTE — PMR Pre-admission (Signed)
PMR Admission Coordinator Pre-Admission Assessment  Patient: Nathan [name]. is an 72 y.o., male MRN: [id number] DOB: Sep 17, 1945 Height: 5' 11.5" (181.6 cm) Weight: 88.5 kg              Insurance Information HMO: X    PPO:      PCP:      IPA:      80/20:      OTHER:  PRIMARY: [employer] Medicare       Policy#: [id number]      Subscriber: Self CM Name: Rebeca Alert      Phone#: [phone]     Fax#: [phone] Pre-Cert#: [id number] given by Victorino Dike [phone]) for 7 days 02/03/18-02/09/18 with faxed updates due to Rebeca Alert     Employer: Retired Benefits:  Phone #: [phone]     Name: Verified online at [email or web address] Eff. Date: 06/28/17       Deduct: $0      Out of Pocket Max: $4400      Life Max: N/A CIR: $345 a day, days 1-5; $0 a day, days 6+      SNF: $0 a day, days 1-20; $160 a day, days 21-48; $0 a day, days 49-100 Outpatient: Necessity      Co-Pay: $40 per visit Home Health: Necessity      Co-Pay: $0 DME: 80%     Co-Pay: 20% Providers: In-network   SECONDARY: None        Emergency Contact Information Contact Information    Name Relation Home Work Mobile   [place] Spouse [phone]  [phone]     Current Medical History  Patient Admitting Diagnosis: Debility  History of Present Illness: Nathan [name] is a 72 year old Left handed male with history of PAF, T2DM, chronic systolic CHF, 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 Donata Clay. Post op course significant for ABLA, thrombocytopenia,  fluid overload, SOB, hypotension as well as development of MS changed with recurrent left sided weakness on 08/02. CT head done revealing interval development of hypoattenuation in right frontal region felt to be extension of prior R-MCA stroke.  On IV heparin to coumadin and continues to have intermittent A fib. Cardiology feels that patient may  require cardioversion in the distant future and coreg added today. He has had bouts of confusion and was found to be retaining urine with I/O cath for [id number] cc in the past 24 hours. He continues to have poor po intake and constipation--no BM since admission. Therapy ongoing and patient noted to be debilitated. CIR recommended due to functional deficits and patient admitted 02/03/18.      Past Medical History  Past Medical History:  Diagnosis Date  . Atrial fibrillation with RVR (HCC) 05/01/2017  . Cardiomyopathy (HCC) 05/01/2017  . CHF (congestive heart failure) (HCC)   . 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 Coronary artery disease   . DM (diabetes mellitus) type 2, uncontrolled, with ketoacidosis (HCC) 05/01/2017  . Essential hypertension 11/26/2016  . History of kidney stones   . HLD (hyperlipidemia) 05/01/2017  . IVCD (intraventricular conduction defect) 04/24/2015  . Mitral valve regurgitation   . Mixed dyslipidemia 11/26/2016  . Stroke (cerebrum) (HCC) 04/29/2017    Family History  family history includes Diabetes in his father; Hypertension in his father.  Prior Rehab/Hospitalizations:  Has the patient had major surgery during 100 days  prior to admission? Yes  Current Medications   Current Facility-Administered Medications:  .  aspirin EC tablet 81 mg, 81 mg, Oral, Daily, 81 mg at 02/02/18 0859 **OR** aspirin chewable tablet 81 mg, 81 mg, Oral, Daily, Donata Clay, Theron Arista, MD, 81 mg at 02/03/18 1052 .  bisacodyl (DULCOLAX) EC tablet 10 mg, 10 mg, Oral, Daily, 10 mg at 02/03/18 1050 **OR** bisacodyl (DULCOLAX) suppository 10 mg, 10 mg, Rectal, Daily, Barrett, Erin R, PA-C .  carvedilol (COREG) tablet 3.125 mg, 3.125 mg, Oral, BID WC, Graciella Freer, PA-C, 3.125 mg at 02/03/18 1610 .  Chlorhexidine Gluconate Cloth 2 % PADS 6 each, 6 each, Topical, Daily, Donata Clay, Theron Arista, MD, 6 each at 02/03/18 1057 .  docusate sodium (COLACE) capsule 200 mg,  200 mg, Oral, Daily PRN, Donata Clay, Theron Arista, MD, 200 mg at 02/02/18 0900 .  guaiFENesin (MUCINEX) 12 hr tablet 1,200 mg, 1,200 mg, Oral, BID, Alleen Borne, MD, 1,200 mg at 02/03/18 1048 .  CBG monitoring, , , 4x Daily, AC & HS **AND** insulin aspart (novoLOG) injection 0-24 Units, 0-24 Units, Subcutaneous, TID WC, Alleen Borne, MD, 8 Units at 02/03/18 1142 .  insulin detemir (LEVEMIR) injection 15 Units, 15 Units, Subcutaneous, Daily, Alleen Borne, MD, 15 Units at 02/03/18 1057 .  losartan (COZAAR) tablet 12.5 mg, 12.5 mg, Oral, QHS, Clegg, Amy D, NP, 12.5 mg at 02/02/18 2113 .  MEDLINE mouth rinse, 15 mL, Mouth Rinse, BID, Donata Clay, Theron Arista, MD, 15 mL at 02/03/18 1059 .  ondansetron (ZOFRAN) injection 4 mg, 4 mg, Intravenous, Q6H PRN, Barrett, Erin R, PA-C, 4 mg at 02/03/18 0706 .  oxyCODONE (Oxy IR/ROXICODONE) immediate release tablet 5 mg, 5 mg, Oral, Q4H PRN, Donata Clay, Theron Arista, MD, 5 mg at 02/02/18 2113 .  pantoprazole (PROTONIX) EC tablet 40 mg, 40 mg, Oral, Daily, Barrett, Erin R, PA-C, 40 mg at 02/03/18 1049 .  pneumococcal 23 valent vaccine (PNU-IMMUNE) injection 0.5 mL, 0.5 mL, Intramuscular, Prior to discharge, Donata Clay, Theron Arista, MD .  polyethylene glycol (MIRALAX / GLYCOLAX) packet 17 g, 17 g, Oral, Daily, Graciella Freer, PA-C, 17 g at 02/03/18 1055 .  rosuvastatin (CRESTOR) tablet 20 mg, 20 mg, Oral, q1800, Clegg, Amy D, NP, 20 mg at 02/02/18 1707 .  sodium chloride flush (NS) 0.9 % injection 10-40 mL, 10-40 mL, Intracatheter, Q12H, Donata Clay, Theron Arista, MD, 10 mL at 02/03/18 1057 .  sodium chloride flush (NS) 0.9 % injection 10-40 mL, 10-40 mL, Intracatheter, PRN, Donata Clay, Theron Arista, MD .  spironolactone (ALDACTONE) tablet 12.5 mg, 12.5 mg, Oral, Daily, Clegg, Amy D, NP, 12.5 mg at 02/03/18 1056 .  tamsulosin (FLOMAX) capsule 0.4 mg, 0.4 mg, Oral, QHS, Barrett, Erin R, PA-C, 0.4 mg at 02/02/18 2113 .  traMADol (ULTRAM) tablet 50 mg, 50 mg, Oral, Q6H PRN, Alleen Borne, MD, 50 mg  at 02/01/18 2127 .  warfarin (COUMADIN) tablet 5 mg, 5 mg, Oral, q1800, Barrett, Erin R, PA-C, 5 mg at 02/02/18 1708 .  warfarin (COUMADIN) video, , Does not apply, Once, Kerin Perna, MD .  Warfarin - Physician Dosing Inpatient, , Does not apply, q1800, Kerin Perna, MD  Patients Current Diet:  Diet Order            DIET DYS 3 Room service appropriate? Yes; Fluid consistency: Thin  Diet effective now              Precautions / Restrictions Precautions Precautions: Fall, Sternal Precaution Booklet Issued: Yes (  comment) Precaution Comments: left weak Restrictions Weight Bearing Restrictions: Yes(sternal precautions)   Has the patient had 2 or more falls or a fall with injury in the past year?No, 1 fall in tub prior to placing non-skid strips   Prior Activity Level Community (5-7x/wk): Prior to admission patient was out daily to run errands, visit friends, and go to church.  He needed assist with recall of somethings like recalling taking his medications mid-day.   Home Assistive Devices / Equipment Home Assistive Devices/Equipment: Shower chair without back Home Equipment: None  Prior Device Use: Indicate devices/aids used by the patient prior to current illness, exacerbation or injury? None of the above  Prior Functional Level Prior Function Level of Independence: Independent Comments: drives, likes to cook and golf, wife states pt was having memory deficits prior to admission  Self Care: Did the patient need help bathing, dressing, using the toilet or eating? Independent  Indoor Mobility: Did the patient need assistance with walking from room to room (with or without device)? Independent  Stairs: Did the patient need assistance with internal or external stairs (with or without device)? Independent  Functional Cognition: Did the patient need help planning regular tasks such as shopping or remembering to take medications? Needed some help  Current Functional  Level Cognition  Overall Cognitive Status: History of cognitive impairments - at baseline Orientation Level: Oriented X4 Following Commands: Follows one step commands with increased time, Follows one step commands inconsistently Safety/Judgement: Decreased awareness of safety, Decreased awareness of deficits General Comments: Pt more alert this session. Requiring increased cues and time thorughout session for ADLs and funcitonal mobility. During LB dressing, pt requiring Max cues to initate donning of sock.    Extremity Assessment (includes Sensation/Coordination)  Upper Extremity Assessment: Generalized weakness  Lower Extremity Assessment: Defer to PT evaluation LLE Deficits / Details: pt with decreaseds strength grossly 3/5 not formally assessed    ADLs  Overall ADL's : Needs assistance/impaired Eating/Feeding: Independent, Sitting Grooming: Set up, Supervision/safety, Sitting Upper Body Bathing: Minimal assistance, Sitting Lower Body Bathing: Maximal assistance, Sit to/from stand Lower Body Bathing Details (indicate cue type and reason): Max A for sit<>Stand.  Upper Body Dressing : Minimal assistance, Sitting Lower Body Dressing: Minimal assistance, Sit to/from stand, Moderate assistance Lower Body Dressing Details (indicate cue type and reason): Mod A for sit<>stand. Min A for donning/doffing socks while seated at recliner. Cues for adherance to sternal precautions Toilet Transfer: Moderate assistance, +2 for safety/equipment, RW(Simulated to recliner) Toilet Transfer Details (indicate cue type and reason): Pt requiring Mod A for power up into standing and then to maintain standing balance with mobility.  Functional mobility during ADLs: Moderate assistance, Rolling walker, Cueing for safety, Cueing for sequencing General ADL Comments: Pt performing LB dressing and functional mobility with Mod A for standing balance. Pt requiirng Max A to facilitate left LE during mobility as he  fatigued.     Mobility  Overal bed mobility: Needs Assistance Bed Mobility: Supine to Sit Rolling: Min assist Sidelying to sit: Max assist, HOB elevated General bed mobility comments: Min a for LLE over EOB    Transfers  Overall transfer level: Needs assistance Equipment used: Rolling walker (2 wheeled) Transfers: Sit to/from Stand Sit to Stand: Min assist Stand pivot transfers: Max assist, +2 physical assistance General transfer comment: Min A to stand, progressing from last visit, however became dizzy and coudlnt tolerate BP, needed to sit back down. second trial, pt still dizzy.     Ambulation / Gait /  Stairs / Psychologist, prison and probation services  Ambulation/Gait Ambulation/Gait assistance: Mod assist, +2 safety/equipment Gait Distance (Feet): 5 Feet Assistive device: Rolling walker (2 wheeled) Gait Pattern/deviations: Step-to pattern, Decreased stride length, Trunk flexed, Narrow base of support General Gait Details: defered 2/2 orthostatic hypotension  Gait velocity interpretation: <1.31 ft/sec, indicative of household ambulator    Posture / Balance Dynamic Sitting Balance Sitting balance - Comments: pt with tendency for left lean but able to correct to midline with cues and maintain Balance Overall balance assessment: Needs assistance Sitting-balance support: No upper extremity supported, Feet supported Sitting balance-Leahy Scale: Fair Sitting balance - Comments: pt with tendency for left lean but able to correct to midline with cues and maintain Postural control: Left lateral lean Standing balance support: Bilateral upper extremity supported, During functional activity Standing balance-Leahy Scale: Poor Standing balance comment: Reliant on UE support    Special needs/care consideration BiPAP/CPAP: No CPM: No Continuous Drip IV: No Dialysis: No         Life Vest: No Oxygen: No Special Bed: No Trach Size: N0 Wound Vac (area): No      Skin: Dry, Abrasion to right lower leg,  Ecchymosis to right medical thigh, Moisture associated skin damage to mid lower sacrum                              Bowel mgmt: Continent, last BM 01/29/18 Bladder mgmt: Continent  Diabetic mgmt: Yes, oral medication prior to admission      Previous Home Environment Living Arrangements: Spouse/significant other Available Help at Discharge: Family, Available 24 hours/day Type of Home: House Home Layout: One level Home Access: Level entry Bathroom Shower/Tub: Engineer, manufacturing systems: Standard Home Care Services: No  Discharge Living Setting Plans for Discharge Living Setting: Patient's home, House, Lives with (comment)(Spouse and son ) Type of Home at Discharge: House Discharge Home Layout: One level Discharge Home Access: Level entry Discharge Bathroom Shower/Tub: Tub/shower unit(with bench ) Discharge Bathroom Toilet: Standard Discharge Bathroom Accessibility: Yes How Accessible: Accessible via walker Does the patient have any problems obtaining your medications?: No  Social/Family/Support Systems Patient Roles: Spouse, Parent Contact Information: Spouse: Primary school teacher  Anticipated Caregiver: Son: Theatre stage manager  Ability/Limitations of Caregiver: Spouse works days as a Investment banker, operational: 24/7 Discharge Plan Discussed with Primary Caregiver: Yes Is Caregiver In Agreement with Plan?: Yes Does Caregiver/Family have Issues with Lodging/Transportation while Pt is in Rehab?: No  Goals/Additional Needs Patient/Family Goal for Rehab: PT/OT/SLP: Supervision-Min A  Expected length of stay: 10-14 days  Cultural Considerations: None Dietary Needs: Dys.3 textures and thin liquids with Carb.Mod. diet restrictions  Equipment Needs: TBD Pt/Family Agrees to Admission and willing to participate: Yes Program Orientation Provided & Reviewed with Pt/Caregiver Including Roles  & Responsibilities: Yes Additional Information Needs: Spouse returns to work 8/19 and son is available for  family education and training  Information Needs to be Provided By: Team FYI  Decrease burden of Care through IP rehab admission: No  Possible need for SNF placement upon discharge: No  Patient Condition: This patient's medical and functional status has changed since the consult dated: 01/31/18 at 1055 in which the Rehabilitation Physician determined and documented that the patient's condition is appropriate for intensive rehabilitative care in an inpatient rehabilitation facility. See "History of Present Illness" (above) for medical update. Functional changes are: Min A +2 16 feet. Patient's medical and functional status update has been discussed with the Rehabilitation physician and patient remains appropriate  for inpatient rehabilitation. Will admit to inpatient rehab today.  Preadmission Screen Completed By:  Fae Pippin, 02/03/2018 12:40 PM ______________________________________________________________________   Discussed status with Dr. Riley Kill on 02/03/18 at 1615 and received telephone approval for admission today.  Admission Coordinator:  Fae Pippin, time 1615/Date 02/03/18

## 2018-02-01 NOTE — Consult Note (Addendum)
Advanced Heart Failure Team Consult Note   Primary Physician: Hadley Penobbins, Robert A, MD PCP-Cardiologist: Dr Dulce SellarMunley   Reason for Consultation: Heart Failure   HPI:    Nathan D [name] is seen today for evaluation of heart failure at the request of Dr Maren BeachVantrigt.   Nathan [name] is 72 year old with history of chronic systolic heart failure diagnosed 5 years ago, diabetes, embolic stroke 2018, ICM, 3 vessel CAD, MVR admitted for scheduled CABG.   His wife says he recovered after his stroke and was independent prior to admit.   Followed by Dr End/Dr Jens Somrenshaw. In February of this year with multivessel coronary disease. Referred to CT surgery. Also had TEE in March of this year for Nathan. Restricted posterior MV leaflet and severe Nathan.   On July 29th he underwent scheduled CABG x4  [place]ima to LAD, SVG to diagonal, SVG to OM, and SVG to PDA. Also had bioprosthetic mitral valve replacement. All drip gradually weaned off.  He had post operative anemia/thrombocytopenia. Had CT of head on  8/2 that showed some progression of previous MCA infart. Having some short term memory loss. PT/OT following and recommending SNF.    Complaining of fatigue.   Echo 01/31/2018   Left ventricle: The cavity size was normal. Systolic function was   severely reduced. The estimated ejection fraction was in the   range of 20% to 25%. Wall motion was normal; there were no   regional wall motion abnormalities. - Ventricular septum: Septal motion showed abnormal function and   dyssynergy. - Mitral valve: A prosthesis was present and functioning normally.   The prosthesis had a normal range of motion. The sewing ring   appeared normal, had no rocking motion, and showed no evidence of   dehiscence. Mean gradient (D): 5 mm Hg. Valve area by pressure   half-time: 1.72 cm^2. - Left atrium: The atrium was moderately dilated.  RHC/LHC 08/18/2017 1. Significant multivessel coronary artery disease, including 50-60% distal LMCA  lesion, 50% proximal LAD stenosis, subtotal occlusion of mid LCx followed by 70% OM3 stenosis, and chronic total occlusion of mid RCA. 2. Normal left heart, right heart, and pulmonary artery pressures. 3. Normal Fick cardiac output/index. 4. CT surgery consulted.    Review of Systems: [y] = yes, [ ]  = no   General: Weight gain [ ] ; Weight loss [ ] ; Anorexia [ ] ; Fatigue [ Y]; Fever [ ] ; Chills [ ] ; Weakness [Y ]  Cardiac: Chest pain/pressure [ ] ; Resting SOB [ ] ; Exertional SOB [ ] ; Orthopnea [ ] ; Pedal Edema [ ] ; Palpitations [ ] ; Syncope [ ] ; Presyncope [ ] ; Paroxysmal nocturnal dyspnea[ ]   Pulmonary: Cough [ ] ; Wheezing[ ] ; Hemoptysis[ ] ; Sputum [ ] ; Snoring [ ]   GI: Vomiting[ ] ; Dysphagia[ ] ; Melena[ ] ; Hematochezia [ ] ; Heartburn[ ] ; Abdominal pain [ ] ; Constipation [ ] ; Diarrhea [ ] ; BRBPR [ ]   GU: Hematuria[ ] ; Dysuria [ ] ; Nocturia[ ]   Vascular: Pain in legs with walking [ ] ; Pain in feet with lying flat [ ] ; Non-healing sores [ ] ; Stroke [Y ]; TIA [ ] ; Slurred speech [ ] ;  Neuro: Headaches[ ] ; Vertigo[ ] ; Seizures[ ] ; Paresthesias[ ] ;Blurred vision [ ] ; Diplopia [ ] ; Vision changes [ ]   Ortho/Skin: Arthritis [ ] ; Joint pain [Y ]; Muscle pain [ ] ; Joint swelling [ ] ; Back Pain [Y ]; Rash [ ]   Psych: Depression[ Y]; Anxiety[ ]   Heme: Bleeding problems [ ] ; Clotting disorders [ ] ; Anemia [ ]   Endocrine: Diabetes [Y ]; Thyroid dysfunction[ ]   Home Medications Prior to Admission medications   Medication Sig Start Date End Date Taking? Authorizing Provider  amiodarone (PACERONE) 200 MG tablet Take 1 tablet (200 mg total) by mouth daily. 05/25/17  Yes Baldo Daub, MD  aspirin EC 81 MG tablet Take 81 mg by mouth daily.   Yes [provider]  carvedilol (COREG) 6.25 MG tablet Take 1 tablet (6.25 mg total) by mouth 2 (two) times daily. 11/24/17  Yes Baldo Daub, MD  glipiZIDE (GLUCOTROL XL) 10 MG 24 hr tablet Take 10 mg by mouth daily with breakfast.  03/30/17  Yes [provider]  lisinopril (PRINIVIL,ZESTRIL) 5 MG tablet Take 2.5 mg by mouth daily.   Yes [provider]  repaglinide (PRANDIN) 0.5 MG tablet Take 1 tablet by mouth 3 (three) times daily with meals.  01/27/17  Yes [provider]  simvastatin (ZOCOR) 40 MG tablet Take 0.5 tablets (20 mg total) by mouth at bedtime. 10/31/17  Yes Baldo Daub, MD  tamsulosin (FLOMAX) 0.4 MG CAPS capsule Take 0.4 mg by mouth at bedtime.  03/30/17  Yes [provider]  acetaminophen (TYLENOL) 325 MG tablet Take 650 mg by mouth every 6 (six) hours as needed (for pain.).     [provider]  cyclobenzaprine (FLEXERIL) 10 MG tablet Take 10 mg by mouth 2 (two) times daily as needed. 04/11/17   [provider]    Past Medical History: Past Medical History:  Diagnosis Date  . Atrial fibrillation with RVR (HCC) 05/01/2017  . Cardiomyopathy (HCC) 05/01/2017  . CHF (congestive heart failure) (HCC)   . 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 Coronary artery disease   . DM (diabetes mellitus) type 2, uncontrolled, with ketoacidosis (HCC) 05/01/2017  . Essential hypertension 11/26/2016  . History of kidney stones   . HLD (hyperlipidemia) 05/01/2017  . IVCD (intraventricular conduction defect) 04/24/2015  . Mitral valve regurgitation   . Mixed dyslipidemia 11/26/2016  . Stroke (cerebrum) (HCC) 04/29/2017    Past Surgical History: Past Surgical History:  Procedure Laterality Date  . BRAIN SURGERY    . CARDIAC CATHETERIZATION    . CORONARY ARTERY BYPASS GRAFT N/A 01/23/2018   Procedure: CORONARY ARTERY BYPASS GRAFTING (CABG) x 4 WITH ENDOSCOPIC HARVESTING OF RIGHT GREATER SAPHENOUS VEIN: LIMA TO LAD, SVG TO RCA, SVG TO DIAG, SVG TO OM ;  Surgeon: Kerin Perna, MD;  Location: [facility] OR;  Service: Open Heart Surgery;  Laterality: N/A;  . IR PERCUTANEOUS ART THROMBECTOMY/INFUSION INTRACRANIAL INC DIAG ANGIO  04/29/2017  . IR RADIOLOGIST EVAL & MGMT   05/17/2017  . IR US GUIDE VASC ACCESS LEFT  04/29/2017  . IR US GUIDE VASC ACCESS RIGHT  04/29/2017  . LEFT ATRIAL APPENDAGE OCCLUSION Left 01/23/2018   Procedure: LEFT ATRIAL APPENDAGE OCCLUSION USING ATRICURE ATRICLIP PRO2 LAA EXCLUSION SYSTEM  SIZE 40, LOT # [id number], CAT # [id number], EXP. DATE 2020-04-28;  Surgeon: Donata Clay, Theron Arista, MD;  Location: [facility] OR;  Service: Open Heart Surgery;  Laterality: Left;  . MITRAL VALVE REPLACEMENT N/A 01/23/2018   Procedure: MITRAL VALVE (MV) REPLACEMENT USING MAGNA MITRAL EASE PERICARDIAL BIOPROSTHESIS, MODEL 7300TFX, SIZE 29 MM, SERIAL # [id number], EXPIRATION  DATE 2021-03-03.;  Surgeon: Kerin Perna, MD;  Location: [facility] OR;  Service: Open Heart Surgery;  Laterality: N/A;  . MULTIPLE EXTRACTIONS WITH ALVEOLOPLASTY N/A 12/26/2017   Procedure: Extraction of tooth #'s 4,6-11, 18 -27, 30, and 31  with alveoloplasty;  Surgeon: [name], DDS;  Location: [facility] OR;  Service: Oral Surgery;  Laterality: N/A;  . RADIOLOGY WITH ANESTHESIA N/A 04/29/2017   Procedure: IR WITH ANESTHESIA;  Surgeon: Radiologist, Medication, MD;  Location: [facility] OR;  Service: Radiology;  Laterality: N/A;  . RIGHT/LEFT HEART CATH AND CORONARY ANGIOGRAPHY N/A 08/18/2017   Procedure: RIGHT/LEFT HEART CATH AND CORONARY ANGIOGRAPHY;  Surgeon: [name], MD;  Location: [facility] INVASIVE CV LAB;  Service: Cardiovascular;  Laterality: N/A;  . TEE WITHOUT CARDIOVERSION N/A 09/22/2017   Procedure: TRANSESOPHAGEAL ECHOCARDIOGRAM (TEE);  Surgeon: [name], MD;  Location: [facility] ENDOSCOPY;  Service: Cardiovascular;  Laterality: N/A;  . TEE WITHOUT CARDIOVERSION N/A 01/23/2018   Procedure: TRANSESOPHAGEAL ECHOCARDIOGRAM (TEE);  Surgeon: Donata Clay, Theron Arista, MD;  Location: [facility] OR;  Service: Open Heart Surgery;  Laterality: N/A;  . TONSILLECTOMY      Family History: Family History  Problem Relation Age of Onset  . Hypertension Father   . Diabetes Father     Social History: Social History   Socioeconomic  History  . Marital status: Married    Spouse name: Not on file  . Number of children: Not on file  . Years of education: Not on file  . Highest education level: Not on file  Occupational History  . Not on file  Social Needs  . Financial resource strain: Not on file  . Food insecurity:    Worry: Not on file    Inability: Not on file  . Transportation needs:    Medical: Not on file    Non-medical: Not on file  Tobacco Use  . Smoking status: Former Smoker    Last attempt to quit: 1994    Years since quitting: 25.6  . Smokeless tobacco: Never Used  Substance and Sexual Activity  . Alcohol use: Yes    Alcohol/week: 1.8 oz    Types: 1 Glasses of wine, 1 Cans of beer, 1 Standard drinks or equivalent per week    Comment: mix drink  . Drug use: No  . Sexual activity: Not on file  Lifestyle  . Physical activity:    Days per week: Not on file    Minutes per session: Not on file  . Stress: Not on file  Relationships  . Social connections:    Talks on phone: Not on file    Gets together: Not on file    Attends religious service: Not on file    Active member of club or organization: Not on file    Attends meetings of clubs or organizations: Not on file    Relationship status: Not on file  Other Topics Concern  . Not on file  Social History Narrative  . Not on file    Allergies:  Allergies  Allergen Reactions  . Fentanyl Shortness Of Breath  . Promethazine Hcl Anaphylaxis and Other (See Comments)    Cardiac arrest  . Foeniculum Vulgare Other (See Comments)    Fennel bulbs--nausea only  . Metformin And Related Diarrhea  . Penicillins Nausea Only    Has patient had a PCN reaction causing immediate rash, facial/tongue/throat swelling, SOB or lightheadedness with hypotension: no Has patient had a PCN reaction causing severe rash involving mucus membranes or skin necrosis: no Has patient had a PCN reaction that required hospitalization: no Has patient had a PCN reaction  occurring within the last 10 years: yes If all of the above answers are "NO", then may proceed with Cephalosporin use.   [name]  Sulfa Antibiotics Other (See Comments)    G.I. Upset    Objective:    Vital Signs:   Temp:  [97.5 F (36.4 C)-98.8 F (37.1 C)] 97.9 F (36.6 C) (08/07 0800) Pulse Rate:  [57-71] 68 (08/07 0800) Resp:  [10-25] 18 (08/07 0800) BP: (99-130)/(42-86) 106/61 (08/07 0800) SpO2:  [90 %-100 %] 99 % (08/07 0800) Weight:  [194 lb 10.7 oz (88.3 kg)] 194 lb 10.7 oz (88.3 kg) (08/07 0500) Last BM Date: 01/29/18  Weight change: Filed Weights   01/30/18 0756 01/31/18 0530 02/01/18 0500  Weight: 201 lb 8 oz (91.4 kg) 201 lb 8 oz (91.4 kg) 194 lb 10.7 oz (88.3 kg)    Intake/Output:   Intake/Output Summary (Last 24 hours) at 02/01/2018 0845 Last data filed at 02/01/2018 0843 Gross per 24 hour  Intake 975 ml  Output 1900 ml  Net -925 ml      Physical Exam   CVP 4.  General:  Sitting in the chair. NAD.  HEENT: normal Neck: supple. JVP flat  . Carotids 2+ bilat; no bruits. No lymphadenopathy or thyromegaly appreciated. Cor: PMI nondisplaced. Regular rate & rhythm. No rubs, gallops or murmurs. Lungs: clear on room air.  Abdomen: soft, nontender, nondistended. No hepatosplenomegaly. No bruits or masses. Good bowel sounds. Extremities: no cyanosis, clubbing, rash, edema. RUE PICC Neuro: alert & orientedx3, cranial nerves grossly intact. moves all 4 extremities w/o difficulty. Affect pleasant   Telemetry   SR 60s  EKG      Labs   Basic Metabolic Panel: Recent Labs  Lab 01/28/18 0425 01/29/18 0445 01/30/18 0403 01/31/18 0330 02/01/18 0416  NA 132* 135 131* 133* 132*  K 3.3* 3.2* 3.9 4.0 3.7  CL 87* 86* 89* 91* 91*  CO2 35* 36* 32 32 31  GLUCOSE 159* 68* 136* 130* 148*  BUN 23 19 25* 25* 26*  CREATININE 0.99 0.90 0.99 0.90 0.88  CALCIUM 8.4* 8.5* 8.6* 8.5* 8.3*    Liver Function Tests: Recent Labs  Lab 01/27/18 0415 01/28/18 0425 01/29/18 0445  01/30/18 0403 01/31/18 0330  AST 28 27 23 24 24   ALT 21 18 17 17 17   ALKPHOS 60 55 65 66 72  BILITOT 1.4* 1.3* 1.3* 1.6* 1.3*  PROT 5.2* 5.1* 6.0* 5.9* 5.8*  ALBUMIN 2.8* 2.5* 2.7* 2.6* 2.5*   No results for input(s): LIPASE, AMYLASE in the last 168 hours. No results for input(s): AMMONIA in the last 168 hours.  CBC: Recent Labs  Lab 01/28/18 0425 01/29/18 0445 01/30/18 0403 01/31/18 0330 02/01/18 0416  WBC 10.1 9.6 13.9* 9.9 9.6  HGB 8.6* 9.0* 9.4* 9.1* 8.9*  HCT 25.6* 27.5* 28.8* 27.5* 27.7*  MCV 92.8 93.2 93.8 93.9 93.9  PLT 150 176 222 180 181    Cardiac Enzymes: No results for input(s): CKTOTAL, CKMB, CKMBINDEX, TROPONINI in the last 168 hours.  BNP: BNP (last 3 results) Recent Labs    05/18/17 1006  BNP 92.3    ProBNP (last 3 results) No results for input(s): PROBNP in the last 8760 hours.   CBG: Recent Labs  Lab 01/31/18 0620 01/31/18 1130 01/31/18 1614 01/31/18 2100 02/01/18 0701  GLUCAP 127* 145* 146* 142* 150*    Coagulation Studies: Recent Labs    01/30/18 0403 01/31/18 0330 02/01/18 0416  LABPROT 15.2 16.4* 20.0*  INR 1.21 1.34 1.72     Imaging    No results found.   Medications:     Current Medications: . bisacodyl  10 mg Oral Daily  Or  . bisacodyl  10 mg Rectal Daily  . Chlorhexidine Gluconate Cloth  6 each Topical Daily  . enoxaparin (LOVENOX) injection  40 mg Subcutaneous Q24H  . furosemide  40 mg Oral Daily  . guaiFENesin  1,200 mg Oral BID  . insulin aspart  0-24 Units Subcutaneous TID WC  . insulin detemir  15 Units Subcutaneous Daily  . mouth rinse  15 mL Mouth Rinse BID  . metolazone  5 mg Oral Daily  . pantoprazole  40 mg Oral Daily  . potassium chloride  40 mEq Oral BID  . simvastatin  20 mg Oral QHS  . sodium chloride flush  10-40 mL Intracatheter Q12H  . tamsulosin  0.4 mg Oral QHS  . warfarin  5 mg Oral q1800  . warfarin   Does not apply Once  . Warfarin - Physician Dosing Inpatient   Does not  apply q1800     Infusions:     Patient Profile   Nathan [name] is 73 year old with history of diabetes, embolic stroke, ICM, 3 vessel CAD, MVR admitted for scheduled CABG on 01/23/2018    Assessment/Plan   1. S/P CABGx4  And bioprosthetic mitral valve replacement No chest pain. On statin  On coumadin.  -No BB for now.   2.  Anemia-expected blood loss Hgb 8.9   3. Thrombocytopenia  Platelets 181   4. Chronic Systolic HF, ICM. Initially diagnosed 2014 and there was mention of viral. Possible NICM/ICM 08/2017 TEE EF 30-35%  01/2018 Post CABG ECHO 20-25% CVP 4-5. Hold lasix .  -Add 12.5 mg spiro daily.  -Add 12.5 mg losartan at bedtime. Hold on entresto with low CVP /SBP -Follow up renal function closely    5. H/O Embolic Stroke On statin.  On coumadin.   6. PAF Rate controlled. On coumadin.  INR 1.72   7. Diabetes.   8. Deconditioning PT recommending SNF.  Possible CIR.   HF Team will follow with you. Once discharged we will follow in HF clinic. Eventually back to Dr Dulce Sellar in [place].   Length of Stay: 9  Amy Clegg, NP  02/01/2018, 8:45 AM  Advanced Heart Failure Team Pager [phone] (M-F; 7a - 4p)  Please contact [facility] for night-coverage after hours (4p -7a ) and weekends on [email or web address]  Patient seen with NP, agree with the above note.   He is post-op from CABG + bioprosthetic MVR. Post-op echo with EF 20-25%, normal bioprosthetic mitral valve.  Mild confusion but able to answer questions.  CVP 4-5. Co-ox 59%.    On exam, no JVD.  1+ ankle edema.  Regular S1S2. Clear lungs.   1. Chronic systolic CHF: Ischemic cardiomyopathy.  He is well-compensated on exam today with CVP 4-5, co-ox 59%.   - Can hold Lasix today.  - Start losartan 12.5 mg daily and spironolactone 12.5 daily.  Can likely transition to [facility] if creatinine/BP stable tomorrow.  - Eventually start Coreg.  2. CAD: S/p CABG.  - D/c Zocor, start Crestor 10 mg daily.  - He is on  warfarin.  3. Atrial fibrillation: Paroxysmal.  He is in NSR today.  He will be on warfarin.  4. H/o CVA: 11/18, related to atrial fibrillation. Based on CT, there is concern for recurrent event versus progression of CT changes from prior stroke.  - Continue warfarin.  5. Confusion: Mild, ?related to CVA.    Mobilize.   Marca Ancona 02/01/2018 12:05 PM

## 2018-02-01 NOTE — Progress Notes (Signed)
Physical Therapy Treatment Patient Details Name: Nathan [name]. MRN: [id number] DOB: June 03, 1946 Today's Date: 02/01/2018    History of Present Illness 72 yo admitted 7/29 for CABGx4 and MVR. PMHx: DM, CAD, CHF, ICM, AFib, CVA, HTN    PT Comments    Pt demonstrating progression with less assistance required for bed mobility and for transfers. Session limited by orthostatic hypotension, pt dizzy and unable to tolerate ambulation. Updating recs to reflect CIR consult as patient is progressing towards mod I baseline.   BP: Start: 109/69 Standing: 96/62 with dizziness.     Follow Up Recommendations  CIR;Supervision/Assistance - 24 hour     Equipment Recommendations  Rolling walker with 5" wheels;3in1 (PT)    Recommendations for Other Services OT consult     Precautions / Restrictions Precautions Precautions: Fall;Sternal Precaution Booklet Issued: Yes (comment) Precaution Comments: left weak Restrictions Weight Bearing Restrictions: Yes    Mobility  Bed Mobility Overal bed mobility: Needs Assistance Bed Mobility: Supine to Sit Rolling: Min assist         General bed mobility comments: Min a for LLE over EOB  Transfers Overall transfer level: Needs assistance Equipment used: Rolling walker (2 wheeled) Transfers: Sit to/from Stand Sit to Stand: Min assist         General transfer comment: Min A to stand, progressing from last visit, however became dizzy and coudlnt tolerate BP, needed to sit back down. second trial, pt still dizzy.   Ambulation/Gait             General Gait Details: defered 2/2 orthostatic hypotension    Stairs             Wheelchair Mobility    Modified Rankin (Stroke Patients Only)       Balance Overall balance assessment: Needs assistance Sitting-balance support: No upper extremity supported;Feet supported Sitting balance-Leahy Scale: Fair     Standing balance support: Bilateral upper extremity  supported;During functional activity Standing balance-Leahy Scale: Poor                              Cognition Arousal/Alertness: Awake/alert Behavior During Therapy: WFL for tasks assessed/performed Overall Cognitive Status: History of cognitive impairments - at baseline Area of Impairment: Memory;Following commands;Safety/judgement                 Orientation Level: Disoriented to;Time   Memory: Decreased short-term memory;Decreased recall of precautions Following Commands: Follows one step commands with increased time;Follows one step commands inconsistently Safety/Judgement: Decreased awareness of safety;Decreased awareness of deficits     General Comments: Pt more alert this session. Requiring increased cues and time thorughout session for ADLs and funcitonal mobility. During LB dressing, pt requiring Max cues to initate donning of sock.      Exercises      General Comments        Pertinent Vitals/Pain Faces Pain Scale: Hurts little more Pain Location: chest, back, left hip Pain Descriptors / Indicators: Constant;Discomfort;Grimacing Pain Intervention(s): Limited activity within patient's tolerance;Monitored during session;Premedicated before session;Repositioned    Home Living                      Prior Function            PT Goals (current goals can now be found in the care plan section) Acute Rehab PT Goals PT Goal Formulation: With patient Time For Goal Achievement: 02/09/18 Potential to Achieve Goals: Fair Progress  towards PT goals: Progressing toward goals    Frequency    Min 3X/week      PT Plan Discharge plan needs to be updated    Co-evaluation              AM-PAC PT "6 Clicks" Daily Activity  Outcome Measure  Difficulty turning over in bed (including adjusting bedclothes, sheets and blankets)?: Unable Difficulty moving from lying on back to sitting on the side of the bed? : Unable Difficulty sitting down  on and standing up from a chair with arms (e.g., wheelchair, bedside commode, etc,.)?: Unable Help needed moving to and from a bed to chair (including a wheelchair)?: A Little Help needed walking in hospital room?: A Lot Help needed climbing 3-5 steps with a railing? : Total 6 Click Score: 9    End of Session Equipment Utilized During Treatment: Gait belt Activity Tolerance: Patient tolerated treatment well Patient left: in chair;with call bell/phone within reach;with chair alarm set;with nursing/sitter in room Nurse Communication: Mobility status;Need for lift equipment;Precautions PT Visit Diagnosis: Other abnormalities of gait and mobility (R26.89);Muscle weakness (generalized) (M62.81);Unsteadiness on feet (R26.81);Difficulty in walking, not elsewhere classified (R26.2)     Time: 1530-1550 PT Time Calculation (min) (ACUTE ONLY): 20 min  Charges:  $Therapeutic Activity: 8-22 mins                    Etta GrandchildSean Dalisha Shively, PT, DPT Acute Rehab Services Pager: [phone]    Etta GrandchildSean Rayshawn Maney 02/01/2018, 5:51 PM

## 2018-02-01 NOTE — Progress Notes (Signed)
Inpatient Rehabilitation  Met with patient and spouse at bedside to discuss team's recommendation for IP Rehab.  Shared booklets, insurance verification letter, and answered questions.  Spouse and patient agree that they want to maximize patient's functional independence prior to returning home and are in favor of IP Rehab.  Plan to follow for timing of medical readiness, insurance, and IP Rehab bed availability.  Call if questions.    I await updated PT note prior to initiating authorization for hopeful IP Rehab admission.  PT aware.    Carmelia Roller., CCC/SLP Admission Coordinator  [place]  Cell [phone]

## 2018-02-01 NOTE — Progress Notes (Signed)
Patient ID: Nathan Philipsandleman D Plotkin Jr., male   DOB: Sep 29, 1945, 72 y.o.   MRN: [id number] EVENING ROUNDS NOTE :     [address]       [place] [id number]             [phone]                 9 Days Post-Op Procedure(s) (LRB): 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 (N/A) CORONARY ARTERY BYPASS GRAFTING (CABG) x 4 WITH ENDOSCOPIC HARVESTING OF RIGHT GREATER SAPHENOUS VEIN: LIMA TO LAD, SVG TO RCA, SVG TO DIAG, SVG TO OM  (N/A) TRANSESOPHAGEAL ECHOCARDIOGRAM (TEE) (N/A) LEFT ATRIAL APPENDAGE OCCLUSION USING ATRICURE ATRICLIP PRO2 LAA EXCLUSION SYSTEM  SIZE 40, LOT # [id number], CAT # [id number], EXP. DATE 2020-04-28 (Left)  Total Length of Stay:  LOS: 9 days  BP 108/80   Pulse 65   Temp 98.2 F (36.8 C) (Oral)   Resp 15   Ht 5' 11.5" (1.816 m)   Wt 194 lb 10.7 oz (88.3 kg)   SpO2 97%   BMI 26.77 kg/m   .Intake/Output      08/06 0701 - 08/07 0700 08/07 0701 - 08/08 0700   P.O. 655 240   I.V. (mL/kg) 200 (2.3) 40 (0.5)   Total Intake(mL/kg) 855 (9.7) 280 (3.2)   Urine (mL/kg/hr) 1450 (0.7) 800 (0.9)   Total Output 1450 800   Net -595 -520        Urine Occurrence 2 x         Lab Results  Component Value Date   WBC 9.6 02/01/2018   HGB 8.9 (L) 02/01/2018   HCT 27.7 (L) 02/01/2018   PLT 181 02/01/2018   GLUCOSE 148 (H) 02/01/2018   CHOL 136 07/28/2017   TRIG 101 07/28/2017   HDL 34 (L) 07/28/2017   LDLCALC 82 07/28/2017   ALT 17 01/31/2018   AST 24 01/31/2018   NA 132 (L) 02/01/2018   K 3.7 02/01/2018   CL 91 (L) 02/01/2018   CREATININE 0.88 02/01/2018   BUN 26 (H) 02/01/2018   CO2 31 02/01/2018   TSH 2.140 07/28/2017   INR 1.72 02/01/2018   HGBA1C 5.9 (H) 01/19/2018   Slow progress, will need rehab   Delight OvensEdward B Zophia Marrone MD  Beeper [phone] Office [phone] 02/01/2018 5:22 PM

## 2018-02-01 NOTE — Progress Notes (Addendum)
TCTS DAILY ICU PROGRESS NOTE                   [address]            [place] [id number]          [phone]   9 Days Post-Op Procedure(s) (LRB): MITRAL VALVE (MV) REPLACEMENT USING MAGNA MITRAL EASE PERICARDIAL BIOPROSTHESIS, MODEL 7300TFX, SIZE 29 MM, SERIAL # [id number], EXPIRATION  DATE 2021-03-03. (N/A) CORONARY ARTERY BYPASS GRAFTING (CABG) x 4 WITH ENDOSCOPIC HARVESTING OF RIGHT GREATER SAPHENOUS VEIN: LIMA TO LAD, SVG TO RCA, SVG TO DIAG, SVG TO OM  (N/A) TRANSESOPHAGEAL ECHOCARDIOGRAM (TEE) (N/A) LEFT ATRIAL APPENDAGE OCCLUSION USING ATRICURE ATRICLIP PRO2 LAA EXCLUSION SYSTEM  SIZE 40, LOT # [id number], CAT # [id number], EXP. DATE 2020-04-28 (Left)  Total Length of Stay:  LOS: 9 days   Subjective:  No new complaints.  Continues to feel like he has been "beat up."  His shortness of breath has improved some.  Objective: Vital signs in last 24 hours: Temp:  [97.5 F (36.4 C)-98.8 F (37.1 C)] 98.4 F (36.9 C) (08/07 0400) Pulse Rate:  [57-71] 68 (08/07 0800) Cardiac Rhythm: Atrial fibrillation (08/06 2000) Resp:  [10-25] 18 (08/07 0800) BP: (99-130)/(42-86) 106/61 (08/07 0800) SpO2:  [90 %-100 %] 99 % (08/07 0800) Weight:  [194 lb 10.7 oz (88.3 kg)] 194 lb 10.7 oz (88.3 kg) (08/07 0500)  Filed Weights   01/30/18 0756 01/31/18 0530 02/01/18 0500  Weight: 201 lb 8 oz (91.4 kg) 201 lb 8 oz (91.4 kg) 194 lb 10.7 oz (88.3 kg)    Weight change: -6 lb 13.4 oz (-3.1 kg)   Hemodynamic parameters for last 24 hours: CVP:  [6 mmHg-8 mmHg] 6 mmHg  Intake/Output from previous day: 08/06 0701 - 08/07 0700 In: 855 [P.O.:655; I.V.:200] Out: 1450 [Urine:1450]  Current Meds: Scheduled Meds: . bisacodyl  10 mg Oral Daily   Or  . bisacodyl  10 mg Rectal Daily  . Chlorhexidine Gluconate Cloth  6 each Topical Daily  . enoxaparin (LOVENOX) injection  40 mg Subcutaneous Q24H  . furosemide  40 mg Oral Daily  . guaiFENesin  1,200 mg Oral BID  . insulin aspart  0-24 Units  Subcutaneous TID WC  . insulin detemir  15 Units Subcutaneous Daily  . mouth rinse  15 mL Mouth Rinse BID  . metolazone  5 mg Oral Daily  . pantoprazole  40 mg Oral Daily  . potassium chloride  40 mEq Oral BID  . simvastatin  20 mg Oral QHS  . sodium chloride flush  10-40 mL Intracatheter Q12H  . tamsulosin  0.4 mg Oral QHS  . warfarin  5 mg Oral q1800  . warfarin   Does not apply Once  . Warfarin - Physician Dosing Inpatient   Does not apply q1800   Continuous Infusions: PRN Meds:.docusate sodium, ondansetron (ZOFRAN) IV, oxyCODONE, pneumococcal 23 valent vaccine, sodium chloride flush, traMADol  General appearance: alert, cooperative and no distress Heart: regular rate and rhythm Lungs: clear to auscultation bilaterally Abdomen: soft, non-tender; bowel sounds normal; no masses,  no organomegaly Extremities: edema trace Wound: clean and dry  Lab Results: CBC: Recent Labs    01/31/18 0330 02/01/18 0416  WBC 9.9 9.6  HGB 9.1* 8.9*  HCT 27.5* 27.7*  PLT 180 181   BMET:  Recent Labs    01/31/18 0330 02/01/18 0416  NA 133* 132*  K 4.0 3.7  CL 91* 91*  CO2 32  31  GLUCOSE 130* 148*  BUN 25* 26*  CREATININE 0.90 0.88  CALCIUM 8.5* 8.3*    CMET: Lab Results  Component Value Date   WBC 9.6 02/01/2018   HGB 8.9 (L) 02/01/2018   HCT 27.7 (L) 02/01/2018   PLT 181 02/01/2018   GLUCOSE 148 (H) 02/01/2018   CHOL 136 07/28/2017   TRIG 101 07/28/2017   HDL 34 (L) 07/28/2017   LDLCALC 82 07/28/2017   ALT 17 01/31/2018   AST 24 01/31/2018   NA 132 (L) 02/01/2018   K 3.7 02/01/2018   CL 91 (L) 02/01/2018   CREATININE 0.88 02/01/2018   BUN 26 (H) 02/01/2018   CO2 31 02/01/2018   TSH 2.140 07/28/2017   INR 1.72 02/01/2018   HGBA1C 5.9 (H) 01/19/2018      PT/INR:  Recent Labs    02/01/18 0416  LABPROT 20.0*  INR 1.72   Radiology: Dg Chest Port 1 View  Result Date: 01/31/2018 CLINICAL DATA:  Shortness of breath for 2 days EXAM: PORTABLE CHEST 1 VIEW  COMPARISON:  Portable exam 0823 hours compared a 09/2017 FINDINGS: RIGHT arm PICC line tip projects over SVC. Enlargement of cardiac silhouette post CABG, MVR, and atrial appendage clipping. Stable mediastinal contours. Decreased pulmonary vascular congestion. Atherosclerotic calcification aorta. Improved pulmonary edema, remaining asymmetrically greater on LEFT. Tiny LEFT pleural effusion. No pneumothorax. IMPRESSION: Improved pulmonary edema. Electronically Signed   By: Ulyses Southward M.D.   On: 01/31/2018 08:35     Assessment/Plan: S/P Procedure(s) (LRB): MITRAL VALVE (MV) REPLACEMENT USING MAGNA MITRAL EASE PERICARDIAL BIOPROSTHESIS, MODEL 7300TFX, SIZE 29 MM, SERIAL # [id number], EXPIRATION  DATE 2021-03-03. (N/A) CORONARY ARTERY BYPASS GRAFTING (CABG) x 4 WITH ENDOSCOPIC HARVESTING OF RIGHT GREATER SAPHENOUS VEIN: LIMA TO LAD, SVG TO RCA, SVG TO DIAG, SVG TO OM  (N/A) TRANSESOPHAGEAL ECHOCARDIOGRAM (TEE) (N/A) LEFT ATRIAL APPENDAGE OCCLUSION USING ATRICURE ATRICLIP PRO2 LAA EXCLUSION SYSTEM  SIZE 40, LOT # [id number], CAT # [id number], EXP. DATE 2020-04-28 (Left)  1. CV- Junctional HR in the 50s 2. INR- up to 1.74 today, will give 5 mg today 3. Pulm- no acute issues, continue IS, off oxygen 4. Renal- creatinine WNL, weight is trending down, on Lasix, Zaroxolyn 5. DM- cbgs well controlled, continue current insulin regimen 6. Dysphagia- improving, diet has been advance to Dsyphagia 3 diet 7. Deconditioning- CIR vs SNF 8. Dispo- patient stable, INR is trending up, ready for d/c to CIR vs SNF depending on insurance authorization  Lowella Dandy 02/01/2018 8:24 AM   Patient more comfortable and breathing on room air after increased diuresis with Lasix, metolazone.  Patient in sinus rhythm this a.m. with better pressure and mixed venous saturation, off milrinone Being assessed for CIR therapies versus later hospital discharge and skilled nursing facility.  Patient's condition and plan of care discussed in  detail with both patient and wife today in the ICU.  Appreciate advanced heart failure input on patient's discharge medication planning.  Postop echo shows resolution of severe MR but EF remains 20-25%.  Maintaining sinus rhythm will help. Patient remains weak and requires assistance x 2 to get out of bed and to to the bathroom.  He is ready to go to CIR with increased level of assistance but if that is not possible we will keep patient in ICU, transfer to stepdown when stronger, and plan  significant placement  patient examined and medical record reviewed,agree with above note. Kathlee Nations Trigt III 02/01/2018

## 2018-02-02 DIAGNOSIS — L8990 Pressure ulcer of unspecified site, unspecified stage: Secondary | ICD-10-CM

## 2018-02-02 DIAGNOSIS — I480 Paroxysmal atrial fibrillation: Secondary | ICD-10-CM

## 2018-02-02 HISTORY — DX: Pressure ulcer of unspecified site, unspecified stage: L89.90

## 2018-02-02 LAB — GLUCOSE, CAPILLARY
Glucose-Capillary: 119 mg/dL — ABNORMAL HIGH (ref 70–99)
Glucose-Capillary: 133 mg/dL — ABNORMAL HIGH (ref 70–99)
Glucose-Capillary: 137 mg/dL — ABNORMAL HIGH (ref 70–99)
Glucose-Capillary: 137 mg/dL — ABNORMAL HIGH (ref 70–99)
Glucose-Capillary: 155 mg/dL — ABNORMAL HIGH (ref 70–99)
Glucose-Capillary: 164 mg/dL — ABNORMAL HIGH (ref 70–99)

## 2018-02-02 LAB — BASIC METABOLIC PANEL
Anion gap: 9 (ref 5–15)
BUN: 19 mg/dL (ref 8–23)
CO2: 27 mmol/L (ref 22–32)
Calcium: 8.4 mg/dL — ABNORMAL LOW (ref 8.9–10.3)
Chloride: 97 mmol/L — ABNORMAL LOW (ref 98–111)
Creatinine, Ser: 0.84 mg/dL (ref 0.61–1.24)
GFR calc Af Amer: >60 mL/min (ref >60)
GFR calc non Af Amer: >60 mL/min (ref >60)
Glucose, Bld: 122 mg/dL — ABNORMAL HIGH (ref 70–99)
Potassium: 4.2 mmol/L (ref 3.5–5.1)
Sodium: 133 mmol/L — ABNORMAL LOW (ref 135–145)

## 2018-02-02 LAB — PROTIME-INR
INR: 2.04
Prothrombin Time: 22.9 seconds — ABNORMAL HIGH (ref 11.4–15.2)

## 2018-02-02 MED ORDER — WARFARIN SODIUM 5 MG PO TABS
5.0000 mg | ORAL_TABLET | Freq: Every day | ORAL | Status: DC
  Administered 2018-02-02: 5 mg via ORAL
  Filled 2018-02-02: qty 1

## 2018-02-02 MED ORDER — ASPIRIN 81 MG PO CHEW: 81.0000 mg | CHEWABLE_TABLET | Freq: Every day | ORAL | Status: DC

## 2018-02-02 NOTE — Progress Notes (Signed)
Inpatient Rehabilitation  [facility]  Plan to continue to follow for timing of medical readiness, insurance authorization, and IP Rehab bed availability.  Plan to update team when I have a decision.  Call if questions.  Charlane FerrettiMelissa Denissa Cozart, M.A., CCC/SLP Admission Coordinator  [facility]  Cell [phone]

## 2018-02-02 NOTE — Plan of Care (Signed)
Patient with no urine output since 2300 last pm, pt uncomfortable and bladder scan > 999, I and O cath 1100 cc yellow urine. Patient very relieved.

## 2018-02-02 NOTE — Progress Notes (Addendum)
Advanced Heart Failure Rounding Note  PCP-Cardiologist: No primary care provider on file.   Subjective:     Yesterday lasix held. He was started on losartan and spiro.   Denies SOB.    Objective:   Weight Range: 89.1 kg Body mass index is 27.01 kg/m.   Vital Signs:   Temp:  [97.5 F (36.4 C)-98.7 F (37.1 C)] 97.8 F (36.6 C) (08/08 0740) Pulse Rate:  [56-77] 75 (08/08 0800) Resp:  [14-27] 23 (08/08 0800) BP: (81-150)/(52-130) 81/53 (08/08 0800) SpO2:  [93 %-100 %] 98 % (08/08 0800) Weight:  [89.1 kg] 89.1 kg (08/08 0600) Last BM Date: 01/29/18  Weight change: Filed Weights   01/31/18 0530 02/01/18 0500 02/02/18 0600  Weight: 91.4 kg 88.3 kg 89.1 kg    Intake/Output:   Intake/Output Summary (Last 24 hours) at 02/02/2018 0903 Last data filed at 02/02/2018 0800 Gross per 24 hour  Intake 740 ml  Output 350 ml  Net 390 ml      Physical Exam   CVP 5-6  General:  Sitting in the chair. No resp difficulty HEENT: Normal Neck: Supple. JVP flat  . Carotids 2+ bilat; no bruits. No lymphadenopathy or thyromegaly appreciated. Cor: PMI nondisplaced. Regular rate & rhythm. No rubs, gallops or murmurs. Sternal incision approximated.  Lungs: Clear on room air.  Abdomen: Soft, nontender, nondistended. No hepatosplenomegaly. No bruits or masses. Good bowel sounds. Extremities: No cyanosis, clubbing, rash, edema. RUE PICC  Neuro: Alert & orientedx3, cranial nerves grossly intact. moves all 4 extremities w/o difficulty. Affect pleasant   Telemetry   NSR 60-70s   EKG    N/a   Labs    CBC Recent Labs    01/31/18 0330 02/01/18 0416  WBC 9.9 9.6  HGB 9.1* 8.9*  HCT 27.5* 27.7*  MCV 93.9 93.9  PLT 180 181   Basic Metabolic Panel Recent Labs    21/30/86 0416 02/02/18 0420  NA 132* 133*  K 3.7 4.2  CL 91* 97*  CO2 31 27  GLUCOSE 148* 122*  BUN 26* 19  CREATININE 0.88 0.84  CALCIUM 8.3* 8.4*   Liver Function Tests Recent Labs    01/31/18 0330  AST  24  ALT 17  ALKPHOS 72  BILITOT 1.3*  PROT 5.8*  ALBUMIN 2.5*   No results for input(s): LIPASE, AMYLASE in the last 72 hours. Cardiac Enzymes No results for input(s): CKTOTAL, CKMB, CKMBINDEX, TROPONINI in the last 72 hours.  BNP: BNP (last 3 results) Recent Labs    05/18/17 1006  BNP 92.3    ProBNP (last 3 results) No results for input(s): PROBNP in the last 8760 hours.   D-Dimer No results for input(s): DDIMER in the last 72 hours. Hemoglobin A1C No results for input(s): HGBA1C in the last 72 hours. Fasting Lipid Panel No results for input(s): CHOL, HDL, LDLCALC, TRIG, CHOLHDL, LDLDIRECT in the last 72 hours. Thyroid Function Tests No results for input(s): TSH, T4TOTAL, T3FREE, THYROIDAB in the last 72 hours.  Invalid input(s): FREET3  Other results:   Imaging     No results found.   Medications:     Scheduled Medications: . aspirin EC  81 mg Oral Daily   Or  . aspirin  81 mg Oral Daily  . bisacodyl  10 mg Oral Daily   Or  . bisacodyl  10 mg Rectal Daily  . Chlorhexidine Gluconate Cloth  6 each Topical Daily  . guaiFENesin  1,200 mg Oral BID  . insulin  aspart  0-24 Units Subcutaneous TID WC  . insulin detemir  15 Units Subcutaneous Daily  . losartan  12.5 mg Oral QHS  . mouth rinse  15 mL Mouth Rinse BID  . pantoprazole  40 mg Oral Daily  . rosuvastatin  20 mg Oral q1800  . sodium chloride flush  10-40 mL Intracatheter Q12H  . spironolactone  12.5 mg Oral Daily  . tamsulosin  0.4 mg Oral QHS  . warfarin  5 mg Oral q1800  . warfarin   Does not apply Once  . Warfarin - Physician Dosing Inpatient   Does not apply q1800     Infusions:   PRN Medications:  docusate sodium, ondansetron (ZOFRAN) IV, oxyCODONE, pneumococcal 23 valent vaccine, sodium chloride flush, traMADol    Patient Profile   Nathan [name] is 72 year old with history of diabetes, embolic stroke, ICM, 3 vessel CAD, MVR admitted for scheduled CABG on 01/23/2018    Assessment/Plan  1. S/P CABGx4  And bioprosthetic mitral valve replacement No evidence of ischemia.   On statin  On coumadin.  -No BB for now.   2.  Anemia-expected blood loss  3. Thrombocytopenia  Platelets 181   4. Chronic Systolic HF, ICM. Initially diagnosed 2014 and there was mention of viral. Possible NICM/ICM 08/2017 TEE EF 30-35%  01/2018 Post CABG ECHO 20-25% -Continue 12.5 mg spiro daily.  -Continue 12.5 mg losartan at bedtime. Hold off on entresto with SBP in 80s.  -Follow up renal function closely    5. H/O Embolic Stroke On statin.  On coumadin.   6. PAF Rate controlled. On coumadin.  INR 20.4   7. Diabetes.   8. Deconditioning PT recommending SNF.  Possible CIR.   Medication concerns reviewed with patient and pharmacy team. Barriers identified: no   Length of Stay: 10  Tonye BecketAmy Clegg, NP  02/02/2018, 9:03 AM  Advanced Heart Failure Team Pager [phone] (M-F; 7a - 4p)  Please contact [facility] for night-coverage after hours (4p -7a ) and weekends on [email or web address]  Patient seen with NP, agree with the above note.    Had urinary retention this morning, required I/O cath.   No dyspnea or chest pain.  Still feels weak, able to walk to door yesterday with PT.  Got up to chair already today.  No lightheadedness.  SBP ranging 80s-100s.  CVP 5-6.   On exam, no JVD.  No edema.  Reg S1S2, no murmur.   1. Chronic systolic CHF: Ischemic cardiomyopathy.  He is well-compensated on exam today with CVP 5-6. BP soft, no room for med adjustment.   - No need for Lasix today.  - Continue losartan 12.5 daily and spironolactone 12.5 daily.  - If BP stable tomorrow, add low dose Coreg 3.125 mg bid.   2. CAD: S/p CABG.  - Continue Crestor.  - He is on warfarin.  3. Atrial fibrillation: Paroxysmal.  He is in NSR today.  He will be on warfarin.  4. H/o CVA: 11/18, related to atrial fibrillation. Based on CT, there is concern for recurrent event versus progression of CT  changes from prior stroke.  - Continue warfarin.  6. Urinary retention: BPH.  Continue Flomax.   Marca AnconaDalton Bowen Kia 02/02/2018 11:44 AM

## 2018-02-02 NOTE — Progress Notes (Addendum)
TCTS DAILY ICU PROGRESS NOTE                   [address]            [place] [address]          [phone]   10 Days Post-Op Procedure(s) (LRB): MITRAL VALVE (MV) REPLACEMENT USING MAGNA MITRAL EASE PERICARDIAL BIOPROSTHESIS, MODEL 7300TFX, SIZE 29 MM, SERIAL # [id number], EXPIRATION  DATE 2021-03-03. (N/A) CORONARY ARTERY BYPASS GRAFTING (CABG) x 4 WITH ENDOSCOPIC HARVESTING OF RIGHT GREATER SAPHENOUS VEIN: LIMA TO LAD, SVG TO RCA, SVG TO DIAG, SVG TO OM  (N/A) TRANSESOPHAGEAL ECHOCARDIOGRAM (TEE) (N/A) LEFT ATRIAL APPENDAGE OCCLUSION USING ATRICURE ATRICLIP PRO2 LAA EXCLUSION SYSTEM  SIZE 40, LOT # [id number], CAT # [id number], EXP. DATE 2020-04-28 (Left)  Total Length of Stay:  LOS: 10 days   Subjective:  Feels tired today.  He states he didn't rest well last night as his condom catheter kept coming off.    Objective: Vital signs in last 24 hours: Temp:  [97.5 F (36.4 C)-98.7 F (37.1 C)] 97.8 F (36.6 C) (08/08 0740) Pulse Rate:  [56-77] 67 (08/08 0700) Cardiac Rhythm: Normal sinus rhythm (08/08 0747) Resp:  [14-27] 14 (08/08 0700) BP: (86-150)/(52-130) 100/63 (08/08 0700) SpO2:  [93 %-100 %] 100 % (08/08 0700) Weight:  [89.1 kg] 89.1 kg (08/08 0600)  Filed Weights   01/31/18 0530 02/01/18 0500 02/02/18 0600  Weight: 91.4 kg 88.3 kg 89.1 kg    Weight change: 0.8 kg   Hemodynamic parameters for last 24 hours: CVP:  [0 mmHg-8 mmHg] 6 mmHg  Intake/Output from previous day: 08/07 0701 - 08/08 0700 In: 640 [P.O.:580; I.V.:60] Out: 800 [Urine:800]  Current Meds: Scheduled Meds: . aspirin EC  81 mg Oral Daily   Or  . aspirin  81 mg Oral Daily  . bisacodyl  10 mg Oral Daily   Or  . bisacodyl  10 mg Rectal Daily  . Chlorhexidine Gluconate Cloth  6 each Topical Daily  . guaiFENesin  1,200 mg Oral BID  . insulin aspart  0-24 Units Subcutaneous TID WC  . insulin detemir  15 Units Subcutaneous Daily  . losartan  12.5 mg Oral QHS  . mouth rinse  15 mL  Mouth Rinse BID  . pantoprazole  40 mg Oral Daily  . potassium chloride  40 mEq Oral BID  . rosuvastatin  20 mg Oral q1800  . sodium chloride flush  10-40 mL Intracatheter Q12H  . spironolactone  12.5 mg Oral Daily  . tamsulosin  0.4 mg Oral QHS  . warfarin   Does not apply Once  . Warfarin - Physician Dosing Inpatient   Does not apply q1800   Continuous Infusions: PRN Meds:.docusate sodium, ondansetron (ZOFRAN) IV, oxyCODONE, pneumococcal 23 valent vaccine, sodium chloride flush, traMADol  General appearance: alert, cooperative and no distress Heart: regular rate and rhythm Lungs: clear to auscultation bilaterally Abdomen: soft, non-tender; bowel sounds normal; no masses,  no organomegaly Extremities: edema trace Wound: clean and dry  Lab Results: CBC: Recent Labs    01/31/18 0330 02/01/18 0416  WBC 9.9 9.6  HGB 9.1* 8.9*  HCT 27.5* 27.7*  PLT 180 181   BMET:  Recent Labs    02/01/18 0416 02/02/18 0420  NA 132* 133*  K 3.7 4.2  CL 91* 97*  CO2 31 27  GLUCOSE 148* 122*  BUN 26* 19  CREATININE 0.88 0.84  CALCIUM 8.3* 8.4*    CMET:  Lab Results  Component Value Date   WBC 9.6 02/01/2018   HGB 8.9 (L) 02/01/2018   HCT 27.7 (L) 02/01/2018   PLT 181 02/01/2018   GLUCOSE 122 (H) 02/02/2018   CHOL 136 07/28/2017   TRIG 101 07/28/2017   HDL 34 (L) 07/28/2017   LDLCALC 82 07/28/2017   ALT 17 01/31/2018   AST 24 01/31/2018   NA 133 (L) 02/02/2018   K 4.2 02/02/2018   CL 97 (L) 02/02/2018   CREATININE 0.84 02/02/2018   BUN 19 02/02/2018   CO2 27 02/02/2018   TSH 2.140 07/28/2017   INR 2.04 02/02/2018   HGBA1C 5.9 (H) 01/19/2018      PT/INR:  Recent Labs    02/02/18 0420  LABPROT 22.9*  INR 2.04   Radiology: No results found.   Assessment/Plan: S/P Procedure(s) (LRB): MITRAL VALVE (MV) REPLACEMENT USING MAGNA MITRAL EASE PERICARDIAL BIOPROSTHESIS, MODEL 7300TFX, SIZE 29 MM, SERIAL # [id number], EXPIRATION  DATE 2021-03-03. (N/A) CORONARY ARTERY  BYPASS GRAFTING (CABG) x 4 WITH ENDOSCOPIC HARVESTING OF RIGHT GREATER SAPHENOUS VEIN: LIMA TO LAD, SVG TO RCA, SVG TO DIAG, SVG TO OM  (N/A) TRANSESOPHAGEAL ECHOCARDIOGRAM (TEE) (N/A) LEFT ATRIAL APPENDAGE OCCLUSION USING ATRICURE ATRICLIP PRO2 LAA EXCLUSION SYSTEM  SIZE 40, LOT # [id number], CAT # [id number], EXP. DATE 2020-04-28 (Left)  1. CV- PAF, NSR currently- started on low dose Cozaar, Spironolactone per AHF.[name]. titrating medications as able 2. INR 2.04, continue coumadin at 5 mg daily 3. Pulm- no acute issues, off oxygen, continue IS 4. Renal- creatinine WNL, weight is stable, continue Spiro per AHF, will stop potassium supplementation 5. DM- sugars well controlled 6. Deconditioning- awaiting bed authorization for CIR 7. DIspo- patient stable, medications per AHF, continue PT/OT... Ready for CIR once bed is available and authorization is obtained     Nathan [name] 02/02/2018 8:01 AM    Chart reviewed, patient examined, agree with above. He continues to make progress.

## 2018-02-02 NOTE — Progress Notes (Signed)
Nutrition Follow-up  DOCUMENTATION CODES:   Non-severe (moderate) malnutrition in context of chronic illness  INTERVENTION:   Add Glucerna Shake po TID, each supplement provides 220 kcal and 10 grams of protein  Add 30 ml Prostat BID, each supplement provides 100 kcals and 15 grams protein.   Continue to encouraged pt to have family bring in his preferred protein supplement  Recommend feeding assistance at meals times to promote po intake  If po intake remains poor, consider Cortrak tube placement with initiation of enteral nutrition   NUTRITION DIAGNOSIS:   Moderate Malnutrition related to chronic illness(CAD, ICM EF 35%, CVA, moderate-severe MR; malnutrition exacerbated by teeth removal) as evidenced by mild fat depletion, moderate muscle depletion.  Being addressed via supplements  GOAL:   Patient will meet greater than or equal to 90% of their needs  Progressing  MONITOR:   PO intake, Supplement acceptance, Labs, Weight trends  REASON FOR ASSESSMENT:   Consult Assessment of nutrition requirement/status  ASSESSMENT:   72 yo male admitted with CAD and moderate to severe mitral regurgitation, severe LV dysfunction with CABG x 4 and MVR on 7/29. PMH of CAD with ischemic CM EF 35%, CVA, moderate-severe ischemic mitral regurgitation and afib, DM, HTN  Pt working with PT on visit today; pt walked to the door utilizing walker before needing to sit in chair. Pt very weak and deconditioned [facility]  Diet advanced to Dysphagia III on 8/7. Recorded po intake 40% on average. PO intake improved since diet advanced from Dysphagia I. However, po intake remains inadequate. RN reporting pt ate 1/3 of breakfast tray this AM. Pt received lunch tray after working with PT and appeared very fatigued. Asked RD to obtain containers on meal trays. Reports po intake limited by weakness and fatigue. Pt reports family has not brought in protein powder as  we discussed on last visit. Plan to order protein supplements today. Encouraged pt to remind family to bring in protein powder.   Constipated, last BM 8/4. MD aware and pt on bowel regimen  Labs: sodium 133 Meds: reviewed   Diet Order:   Diet Order            DIET DYS 3 Room service appropriate? Yes; Fluid consistency: Thin  Diet effective now              EDUCATION NEEDS:   Education needs have been addressed  Skin:  Skin Assessment: Reviewed RN Assessment  Last BM:  8/4  Height:   Ht Readings from Last 1 Encounters:  01/23/18 5' 11.5" (1.816 m)    Weight:   Wt Readings from Last 1 Encounters:  02/02/18 89.1 kg    Ideal Body Weight:     BMI:  Body mass index is 27.01 kg/m.  Estimated Nutritional Needs:   Kcal:  2100-2300 kcals   Protein:  105-115 g  Fluid:  >/= 2L    Romelle Starcherate Gale Hulse MS, RD, LDN, CNSC [phone] Pager  [phone] Weekend/On-Call Pager

## 2018-02-02 NOTE — Progress Notes (Signed)
Patient with no UOP since last I&O cath. Patient writhing in pain at his bladder and back. Bladder scan reads >650. I&O cath done per protocol with 700 mL clear yellow urine. Patient's pain relieved. Awaiting nightly rounds to address with MD.

## 2018-02-02 NOTE — Progress Notes (Signed)
Patient ID: Nathan Philipsandleman D Yera Jr., male   DOB: January 13, 1946, 72 y.o.   MRN: [id number] TCTS Evening Rounds:  Hemodynamically stable today. Has had urinary retention today and had In and Out cath twice for 1100 cc this am and 700 cc this pm. If any further problem would insert foley and leave in for a few days. He is on Flomax and had been passing urine ok until today.

## 2018-02-03 ENCOUNTER — Inpatient Hospital Stay (HOSPITAL_COMMUNITY)
Admission: RE | Admit: 2018-02-03 | Discharge: 2018-02-24 | DRG: 945 | Disposition: A | Discharge status: Skilled Nursing Facility | Payer: Medicare Other | Source: Intra-hospital | Attending: Physical Medicine & Rehabilitation | Admitting: Physical Medicine & Rehabilitation

## 2018-02-03 ENCOUNTER — Other Ambulatory Visit: Payer: Self-pay

## 2018-02-03 ENCOUNTER — Encounter (HOSPITAL_COMMUNITY): Payer: Self-pay | Admitting: *Deleted

## 2018-02-03 DIAGNOSIS — I6930 Unspecified sequelae of cerebral infarction: Secondary | ICD-10-CM

## 2018-02-03 DIAGNOSIS — E11649 Type 2 diabetes mellitus with hypoglycemia without coma: Secondary | ICD-10-CM | POA: Diagnosis present

## 2018-02-03 DIAGNOSIS — Z23 Encounter for immunization: Secondary | ICD-10-CM | POA: Diagnosis not present

## 2018-02-03 DIAGNOSIS — Z7982 Long term (current) use of aspirin: Secondary | ICD-10-CM | POA: Diagnosis not present

## 2018-02-03 DIAGNOSIS — E782 Mixed hyperlipidemia: Secondary | ICD-10-CM | POA: Diagnosis present

## 2018-02-03 DIAGNOSIS — Z8249 Family history of ischemic heart disease and other diseases of the circulatory system: Secondary | ICD-10-CM | POA: Diagnosis not present

## 2018-02-03 DIAGNOSIS — I5042 Chronic combined systolic (congestive) and diastolic (congestive) heart failure: Secondary | ICD-10-CM | POA: Diagnosis present

## 2018-02-03 DIAGNOSIS — D696 Thrombocytopenia, unspecified: Secondary | ICD-10-CM | POA: Diagnosis not present

## 2018-02-03 DIAGNOSIS — Z833 Family history of diabetes mellitus: Secondary | ICD-10-CM | POA: Diagnosis not present

## 2018-02-03 DIAGNOSIS — N390 Urinary tract infection, site not specified: Secondary | ICD-10-CM | POA: Diagnosis present

## 2018-02-03 DIAGNOSIS — I6601 Occlusion and stenosis of right middle cerebral artery: Secondary | ICD-10-CM | POA: Diagnosis not present

## 2018-02-03 DIAGNOSIS — K5900 Constipation, unspecified: Secondary | ICD-10-CM | POA: Diagnosis present

## 2018-02-03 DIAGNOSIS — Z951 Presence of aortocoronary bypass graft: Secondary | ICD-10-CM

## 2018-02-03 DIAGNOSIS — R319 Hematuria, unspecified: Secondary | ICD-10-CM | POA: Diagnosis not present

## 2018-02-03 DIAGNOSIS — Z79899 Other long term (current) drug therapy: Secondary | ICD-10-CM | POA: Diagnosis not present

## 2018-02-03 DIAGNOSIS — I69351 Hemiplegia and hemiparesis following cerebral infarction affecting right dominant side: Secondary | ICD-10-CM

## 2018-02-03 DIAGNOSIS — I4891 Unspecified atrial fibrillation: Secondary | ICD-10-CM

## 2018-02-03 DIAGNOSIS — R338 Other retention of urine: Secondary | ICD-10-CM

## 2018-02-03 DIAGNOSIS — Z7901 Long term (current) use of anticoagulants: Secondary | ICD-10-CM

## 2018-02-03 DIAGNOSIS — D62 Acute posthemorrhagic anemia: Secondary | ICD-10-CM | POA: Diagnosis present

## 2018-02-03 DIAGNOSIS — D638 Anemia in other chronic diseases classified elsewhere: Secondary | ICD-10-CM | POA: Diagnosis not present

## 2018-02-03 DIAGNOSIS — E871 Hypo-osmolality and hyponatremia: Secondary | ICD-10-CM | POA: Diagnosis present

## 2018-02-03 DIAGNOSIS — I5022 Chronic systolic (congestive) heart failure: Secondary | ICD-10-CM

## 2018-02-03 DIAGNOSIS — N9989 Other postprocedural complications and disorders of genitourinary system: Secondary | ICD-10-CM

## 2018-02-03 DIAGNOSIS — I2510 Atherosclerotic heart disease of native coronary artery without angina pectoris: Secondary | ICD-10-CM | POA: Diagnosis present

## 2018-02-03 DIAGNOSIS — I110 Hypertensive heart disease with heart failure: Secondary | ICD-10-CM | POA: Diagnosis present

## 2018-02-03 DIAGNOSIS — B9689 Other specified bacterial agents as the cause of diseases classified elsewhere: Secondary | ICD-10-CM | POA: Diagnosis present

## 2018-02-03 DIAGNOSIS — Z7984 Long term (current) use of oral hypoglycemic drugs: Secondary | ICD-10-CM | POA: Diagnosis not present

## 2018-02-03 DIAGNOSIS — N401 Enlarged prostate with lower urinary tract symptoms: Secondary | ICD-10-CM

## 2018-02-03 DIAGNOSIS — R791 Abnormal coagulation profile: Secondary | ICD-10-CM | POA: Diagnosis not present

## 2018-02-03 DIAGNOSIS — R7309 Other abnormal glucose: Secondary | ICD-10-CM | POA: Diagnosis not present

## 2018-02-03 DIAGNOSIS — E119 Type 2 diabetes mellitus without complications: Secondary | ICD-10-CM | POA: Diagnosis present

## 2018-02-03 DIAGNOSIS — E162 Hypoglycemia, unspecified: Secondary | ICD-10-CM

## 2018-02-03 DIAGNOSIS — Z953 Presence of xenogenic heart valve: Secondary | ICD-10-CM | POA: Diagnosis not present

## 2018-02-03 DIAGNOSIS — Z87891 Personal history of nicotine dependence: Secondary | ICD-10-CM | POA: Diagnosis not present

## 2018-02-03 DIAGNOSIS — I480 Paroxysmal atrial fibrillation: Secondary | ICD-10-CM | POA: Diagnosis present

## 2018-02-03 DIAGNOSIS — R5381 Other malaise: Principal | ICD-10-CM | POA: Diagnosis present

## 2018-02-03 DIAGNOSIS — Z8673 Personal history of transient ischemic attack (TIA), and cerebral infarction without residual deficits: Secondary | ICD-10-CM | POA: Diagnosis not present

## 2018-02-03 DIAGNOSIS — A499 Bacterial infection, unspecified: Secondary | ICD-10-CM | POA: Insufficient documentation

## 2018-02-03 HISTORY — DX: Other malaise: R53.81

## 2018-02-03 HISTORY — DX: Other postprocedural complications and disorders of genitourinary system: N99.89

## 2018-02-03 HISTORY — DX: Other retention of urine: R33.8

## 2018-02-03 LAB — CBC
HCT: 29.3 % — ABNORMAL LOW (ref 39.0–52.0)
Hemoglobin: 9.5 g/dL — ABNORMAL LOW (ref 13.0–17.0)
MCH: 30.2 pg (ref 26.0–34.0)
MCHC: 32.4 g/dL (ref 30.0–36.0)
MCV: 93 fL (ref 78.0–100.0)
PLATELETS: 163 10*3/uL (ref 150–400)
RBC: 3.15 MIL/uL — AB (ref 4.22–5.81)
RDW: 13.2 % (ref 11.5–15.5)
WBC: 10.7 10*3/uL — ABNORMAL HIGH (ref 4.0–10.5)

## 2018-02-03 LAB — URINALYSIS, DIPSTICK ONLY
BILIRUBIN URINE: NEGATIVE
GLUCOSE, UA: NEGATIVE mg/dL
Ketones, ur: NEGATIVE mg/dL
Nitrite: NEGATIVE
Protein, ur: NEGATIVE mg/dL
SPECIFIC GRAVITY, URINE: 1.018 (ref 1.005–1.030)
pH: 5 (ref 5.0–8.0)

## 2018-02-03 LAB — GLUCOSE, CAPILLARY
Glucose-Capillary: 144 mg/dL — ABNORMAL HIGH (ref 70–99)
Glucose-Capillary: 211 mg/dL — ABNORMAL HIGH (ref 70–99)
Glucose-Capillary: 100 mg/dL — ABNORMAL HIGH (ref 70–99)
Glucose-Capillary: 133 mg/dL — ABNORMAL HIGH (ref 70–99)

## 2018-02-03 LAB — BASIC METABOLIC PANEL
Anion gap: 9 (ref 5–15)
BUN: 16 mg/dL (ref 8–23)
CHLORIDE: 96 mmol/L — AB (ref 98–111)
CO2: 26 mmol/L (ref 22–32)
CREATININE: 0.96 mg/dL (ref 0.61–1.24)
Calcium: 8.8 mg/dL — ABNORMAL LOW (ref 8.9–10.3)
GFR calc Af Amer: >60 mL/min (ref >60)
GLUCOSE: 201 mg/dL — AB (ref 70–99)
POTASSIUM: 4.5 mmol/L (ref 3.5–5.1)
Sodium: 131 mmol/L — ABNORMAL LOW (ref 135–145)

## 2018-02-03 LAB — PROTIME-INR
INR: 2.6
Prothrombin Time: 27.6 seconds — ABNORMAL HIGH (ref 11.4–15.2)

## 2018-02-03 MED ORDER — ASPIRIN EC 81 MG PO TBEC
81.0000 mg | DELAYED_RELEASE_TABLET | Freq: Every day | ORAL | Status: DC
  Administered 2018-02-05 – 2018-02-24 (×14): 81 mg via ORAL
  Filled 2018-02-03 (×19): qty 1

## 2018-02-03 MED ORDER — AMOXICILLIN-POT CLAVULANATE 875-125 MG PO TABS: 1.0000 | ORAL_TABLET | Freq: Two times a day (BID) | ORAL | 0 refills | Status: DC

## 2018-02-03 MED ORDER — OXYCODONE HCL 5 MG PO TABS
5.0000 mg | ORAL_TABLET | Freq: Four times a day (QID) | ORAL | Status: DC | PRN
  Administered 2018-02-03 – 2018-02-19 (×22): 5 mg via ORAL
  Filled 2018-02-03 (×23): qty 1

## 2018-02-03 MED ORDER — DIPHENHYDRAMINE HCL 12.5 MG/5ML PO ELIX: 12.5000 mg | ORAL_SOLUTION | Freq: Four times a day (QID) | ORAL | Status: DC | PRN

## 2018-02-03 MED ORDER — CHLORHEXIDINE GLUCONATE CLOTH 2 % EX PADS: 6.0000 | MEDICATED_PAD | Freq: Every day | CUTANEOUS | Status: DC

## 2018-02-03 MED ORDER — ONDANSETRON HCL 4 MG PO TABS
4.0000 mg | ORAL_TABLET | Freq: Four times a day (QID) | ORAL | Status: DC | PRN
  Administered 2018-02-12 – 2018-02-22 (×4): 4 mg via ORAL
  Filled 2018-02-03 (×4): qty 1

## 2018-02-03 MED ORDER — GLUCERNA SHAKE PO LIQD
237.0000 mL | Freq: Three times a day (TID) | ORAL | Status: DC
  Administered 2018-02-03: 237 mL via ORAL

## 2018-02-03 MED ORDER — CEPHALEXIN 500 MG PO CAPS: 500.0000 mg | ORAL_CAPSULE | Freq: Four times a day (QID) | ORAL | 0 refills | Status: DC

## 2018-02-03 MED ORDER — WARFARIN SODIUM 5 MG PO TABS
5.0000 mg | ORAL_TABLET | Freq: Every day | ORAL | Status: DC
  Administered 2018-02-04 – 2018-02-08 (×5): 5 mg via ORAL
  Filled 2018-02-03 (×5): qty 1

## 2018-02-03 MED ORDER — SPIRONOLACTONE 25 MG PO TABS: 12.5000 mg | ORAL_TABLET | Freq: Every day | ORAL | Status: DC

## 2018-02-03 MED ORDER — PRO-STAT SUGAR FREE PO LIQD
30.0000 mL | Freq: Two times a day (BID) | ORAL | Status: DC
  Administered 2018-02-04 – 2018-02-24 (×12): 30 mL via ORAL
  Filled 2018-02-03 (×22): qty 30

## 2018-02-03 MED ORDER — LOSARTAN POTASSIUM 25 MG PO TABS
12.5000 mg | ORAL_TABLET | Freq: Every day | ORAL | Status: DC
Start: 2018-02-03 — End: 2018-02-24
  Administered 2018-02-03 – 2018-02-23 (×20): 12.5 mg via ORAL
  Filled 2018-02-03 (×2): qty 1
  Filled 2018-02-03: qty 0.5
  Filled 2018-02-03 (×2): qty 1
  Filled 2018-02-03 (×3): qty 0.5
  Filled 2018-02-03 (×11): qty 1
  Filled 2018-02-03: qty 0.5
  Filled 2018-02-03: qty 1

## 2018-02-03 MED ORDER — CARVEDILOL 3.125 MG PO TABS
3.1250 mg | ORAL_TABLET | Freq: Two times a day (BID) | ORAL | Status: DC
  Filled 2018-02-03 (×4): qty 1

## 2018-02-03 MED ORDER — PNEUMOCOCCAL VAC POLYVALENT 25 MCG/0.5ML IJ INJ
0.5000 mL | INJECTION | INTRAMUSCULAR | Status: AC
  Administered 2018-02-04: 0.5 mL via INTRAMUSCULAR

## 2018-02-03 MED ORDER — ACETAMINOPHEN 325 MG PO TABS
325.0000 mg | ORAL_TABLET | ORAL | Status: DC | PRN
  Administered 2018-02-03: 650 mg via ORAL
  Administered 2018-02-04: 325 mg via ORAL
  Administered 2018-02-04 – 2018-02-05 (×3): 650 mg via ORAL
  Administered 2018-02-06: 325 mg via ORAL
  Administered 2018-02-06 – 2018-02-22 (×7): 650 mg via ORAL
  Filled 2018-02-03 (×13): qty 2
  Filled 2018-02-03: qty 1
  Filled 2018-02-03: qty 2

## 2018-02-03 MED ORDER — PRO-STAT SUGAR FREE PO LIQD
30.0000 mL | Freq: Two times a day (BID) | ORAL | Status: DC
  Filled 2018-02-03: qty 30

## 2018-02-03 MED ORDER — BISACODYL 5 MG PO TBEC
10.0000 mg | DELAYED_RELEASE_TABLET | Freq: Every day | ORAL | Status: DC
  Administered 2018-02-04 – 2018-02-24 (×17): 10 mg via ORAL
  Filled 2018-02-03 (×20): qty 2

## 2018-02-03 MED ORDER — POLYETHYLENE GLYCOL 3350 17 G PO PACK
17.0000 g | PACK | Freq: Every day | ORAL | Status: DC
  Administered 2018-02-03: 17 g via ORAL
  Filled 2018-02-03: qty 1

## 2018-02-03 MED ORDER — ONDANSETRON HCL 4 MG/2ML IJ SOLN
4.0000 mg | Freq: Four times a day (QID) | INTRAMUSCULAR | Status: DC | PRN
Start: 2018-02-03 — End: 2018-02-24

## 2018-02-03 MED ORDER — GUAIFENESIN-DM 100-10 MG/5ML PO SYRP
5.0000 mL | ORAL_SOLUTION | Freq: Four times a day (QID) | ORAL | Status: DC | PRN
  Filled 2018-02-03: qty 10

## 2018-02-03 MED ORDER — FLEET ENEMA 7-19 GM/118ML RE ENEM
1.0000 | ENEMA | Freq: Once | RECTAL | Status: AC | PRN
  Administered 2018-02-04: 1 via RECTAL
  Filled 2018-02-03: qty 1

## 2018-02-03 MED ORDER — PREMIER PROTEIN SHAKE
11.0000 [oz_av] | Freq: Three times a day (TID) | ORAL | Status: DC
  Administered 2018-02-04 – 2018-02-24 (×28): 11 [oz_av] via ORAL
  Filled 2018-02-03 (×63): qty 325.31

## 2018-02-03 MED ORDER — CARVEDILOL 3.125 MG PO TABS
3.1250 mg | ORAL_TABLET | Freq: Two times a day (BID) | ORAL | Status: DC
  Administered 2018-02-03 (×2): 3.125 mg via ORAL
  Filled 2018-02-03 (×2): qty 1

## 2018-02-03 MED ORDER — INSULIN ASPART 100 UNIT/ML ~~LOC~~ SOLN
0.0000 [IU] | Freq: Three times a day (TID) | SUBCUTANEOUS | Status: DC
  Administered 2018-02-04 – 2018-02-12 (×17): 2 [IU] via SUBCUTANEOUS
  Administered 2018-02-13 – 2018-02-14 (×2): 8 [IU] via SUBCUTANEOUS
  Administered 2018-02-15: 2 [IU] via SUBCUTANEOUS
  Administered 2018-02-15 – 2018-02-20 (×4): 4 [IU] via SUBCUTANEOUS
  Administered 2018-02-22: 2 [IU] via SUBCUTANEOUS
  Administered 2018-02-23: 4 [IU] via SUBCUTANEOUS
  Administered 2018-02-23 – 2018-02-24 (×2): 2 [IU] via SUBCUTANEOUS

## 2018-02-03 MED ORDER — TAMSULOSIN HCL 0.4 MG PO CAPS
0.4000 mg | ORAL_CAPSULE | Freq: Every day | ORAL | Status: DC
Start: 2018-02-03 — End: 2018-02-24
  Administered 2018-02-03 – 2018-02-23 (×21): 0.4 mg via ORAL
  Filled 2018-02-03 (×21): qty 1

## 2018-02-03 MED ORDER — LACTULOSE 10 GM/15ML PO SOLN
20.0000 g | Freq: Once | ORAL | Status: AC
  Administered 2018-02-03: 20 g via ORAL
  Filled 2018-02-03: qty 30

## 2018-02-03 MED ORDER — INSULIN DETEMIR 100 UNIT/ML ~~LOC~~ SOLN
15.0000 [IU] | Freq: Every day | SUBCUTANEOUS | Status: DC
  Administered 2018-02-04 – 2018-02-15 (×12): 15 [IU] via SUBCUTANEOUS
  Filled 2018-02-03 (×12): qty 0.15

## 2018-02-03 MED ORDER — WARFARIN SODIUM 5 MG PO TABS: 5.0000 mg | ORAL_TABLET | Freq: Every day | ORAL | Status: DC

## 2018-02-03 MED ORDER — BISACODYL 10 MG RE SUPP: 10.0000 mg | Freq: Every day | RECTAL | Status: DC | PRN

## 2018-02-03 MED ORDER — ASPIRIN 81 MG PO CHEW
81.0000 mg | CHEWABLE_TABLET | Freq: Every day | ORAL | Status: DC
  Administered 2018-02-04 – 2018-02-14 (×7): 81 mg via ORAL
  Filled 2018-02-03 (×12): qty 1

## 2018-02-03 MED ORDER — WARFARIN - PHYSICIAN DOSING INPATIENT
Freq: Every day | Status: DC
  Administered 2018-02-04 – 2018-02-07 (×3)

## 2018-02-03 MED ORDER — TRAZODONE HCL 50 MG PO TABS
25.0000 mg | ORAL_TABLET | Freq: Every evening | ORAL | Status: DC | PRN
  Administered 2018-02-04: 50 mg via ORAL
  Administered 2018-02-04: 25 mg via ORAL
  Administered 2018-02-05 – 2018-02-23 (×17): 50 mg via ORAL
  Filled 2018-02-03 (×19): qty 1

## 2018-02-03 MED ORDER — PANTOPRAZOLE SODIUM 40 MG PO TBEC
40.0000 mg | DELAYED_RELEASE_TABLET | Freq: Every day | ORAL | Status: DC
  Administered 2018-02-04 – 2018-02-23 (×20): 40 mg via ORAL
  Filled 2018-02-03 (×21): qty 1

## 2018-02-03 MED ORDER — CARVEDILOL 3.125 MG PO TABS: 3.1250 mg | ORAL_TABLET | Freq: Two times a day (BID) | ORAL | Status: DC

## 2018-02-03 MED ORDER — ALUM & MAG HYDROXIDE-SIMETH 200-200-20 MG/5ML PO SUSP
30.0000 mL | ORAL | Status: DC | PRN
  Administered 2018-02-22: 30 mL via ORAL

## 2018-02-03 MED ORDER — ROSUVASTATIN CALCIUM 20 MG PO TABS
20.0000 mg | ORAL_TABLET | Freq: Every day | ORAL | Status: DC
  Administered 2018-02-04 – 2018-02-23 (×20): 20 mg via ORAL
  Filled 2018-02-03 (×20): qty 1

## 2018-02-03 MED ORDER — BISACODYL 10 MG RE SUPP
10.0000 mg | Freq: Every day | RECTAL | Status: DC
  Administered 2018-02-08: 10 mg via RECTAL
  Filled 2018-02-03 (×4): qty 1

## 2018-02-03 MED ORDER — LOSARTAN POTASSIUM 25 MG PO TABS: 12.5000 mg | ORAL_TABLET | Freq: Every day | ORAL | Status: DC

## 2018-02-03 MED ORDER — PRO-STAT SUGAR FREE PO LIQD: 30.0000 mL | Freq: Two times a day (BID) | ORAL | 0 refills | Status: DC

## 2018-02-03 MED ORDER — SORBITOL 70 % SOLN
30.0000 mL | Freq: Once | Status: AC
  Administered 2018-02-03: 30 mL via ORAL
  Filled 2018-02-03: qty 30

## 2018-02-03 MED ORDER — OXYCODONE HCL 5 MG PO TABS: 5.0000 mg | ORAL_TABLET | ORAL | 0 refills | Status: DC | PRN

## 2018-02-03 MED ORDER — GUAIFENESIN ER 600 MG PO TB12
1200.0000 mg | ORAL_TABLET | Freq: Two times a day (BID) | ORAL | Status: DC
  Administered 2018-02-03 – 2018-02-23 (×41): 1200 mg via ORAL
  Filled 2018-02-03 (×41): qty 2

## 2018-02-03 MED ORDER — GLUCERNA SHAKE PO LIQD: 237.0000 mL | Freq: Three times a day (TID) | ORAL | 0 refills | Status: DC

## 2018-02-03 MED ORDER — SPIRONOLACTONE 12.5 MG HALF TABLET
12.5000 mg | ORAL_TABLET | Freq: Every day | ORAL | Status: DC
  Administered 2018-02-04 – 2018-02-24 (×21): 12.5 mg via ORAL
  Filled 2018-02-03 (×21): qty 1

## 2018-02-03 MED ORDER — POLYETHYLENE GLYCOL 3350 17 G PO PACK
17.0000 g | PACK | Freq: Two times a day (BID) | ORAL | Status: DC
  Administered 2018-02-03 – 2018-02-24 (×37): 17 g via ORAL
  Filled 2018-02-03 (×42): qty 1

## 2018-02-03 NOTE — Progress Notes (Addendum)
Advanced Heart Failure Rounding Note  PCP-Cardiologist: No primary care provider on file.   Subjective:    Cr 0.84. K 4.2. CVP 6-7 cm.  Feeling OK this am. Denies SOB or lightheadedness. Main complaint is constipation. He states he has problems with urinary retention when he is constipated.   Objective:   Weight Range: 88.5 kg Body mass index is 26.83 kg/m.   Vital Signs:   Temp:  [97.6 F (36.4 C)-98.4 F (36.9 C)] 97.9 F (36.6 C) (08/09 0700) Pulse Rate:  [55-74] 55 (08/09 0333) Resp:  [13-27] 15 (08/09 0333) BP: (90-123)/(47-109) 112/62 (08/09 0333) SpO2:  [93 %-99 %] 98 % (08/09 0333) Weight:  [88.5 kg] 88.5 kg (08/09 0500) Last BM Date: (laxative given)  Weight change: Filed Weights   02/01/18 0500 02/02/18 0600 02/03/18 0500  Weight: 88.3 kg 89.1 kg 88.5 kg    Intake/Output:   Intake/Output Summary (Last 24 hours) at 02/03/2018 0823 Last data filed at 02/02/2018 2100 Gross per 24 hour  Intake 900 ml  Output 3600 ml  Net -2700 ml      Physical Exam   CVP 6-7  General:No resp difficulty. HEENT: Normal Neck: Supple. JVP not elevated. Carotids 2+ bilat; no bruits. No thyromegaly or nodule noted. Cor: PMI nondisplaced. RRR, No M/G/R noted Lungs: CTAB, normal effort. Abdomen: Soft, non-tender, non-distended, no HSM. No bruits or masses. +BS  Extremities: No cyanosis, clubbing, rash, or edema. RUE PICC  Neuro: Alert & orientedx3, cranial nerves grossly intact. moves all 4 extremities w/o difficulty. Affect pleasant   Telemetry   ?Afib 60s.   EKG    No new tracings.    Labs    CBC Recent Labs    02/01/18 0416  WBC 9.6  HGB 8.9*  HCT 27.7*  MCV 93.9  PLT 181   Basic Metabolic Panel Recent Labs    16/10/96 0416 02/02/18 0420  NA 132* 133*  K 3.7 4.2  CL 91* 97*  CO2 31 27  GLUCOSE 148* 122*  BUN 26* 19  CREATININE 0.88 0.84  CALCIUM 8.3* 8.4*   Liver Function Tests No results for input(s): AST, ALT, ALKPHOS, BILITOT, PROT,  ALBUMIN in the last 72 hours. No results for input(s): LIPASE, AMYLASE in the last 72 hours. Cardiac Enzymes No results for input(s): CKTOTAL, CKMB, CKMBINDEX, TROPONINI in the last 72 hours.  BNP: BNP (last 3 results) Recent Labs    05/18/17 1006  BNP 92.3    ProBNP (last 3 results) No results for input(s): PROBNP in the last 8760 hours.   D-Dimer No results for input(s): DDIMER in the last 72 hours. Hemoglobin A1C No results for input(s): HGBA1C in the last 72 hours. Fasting Lipid Panel No results for input(s): CHOL, HDL, LDLCALC, TRIG, CHOLHDL, LDLDIRECT in the last 72 hours. Thyroid Function Tests No results for input(s): TSH, T4TOTAL, T3FREE, THYROIDAB in the last 72 hours.  Invalid input(s): FREET3  Other results:   Imaging    No results found.   Medications:     Scheduled Medications: . aspirin EC  81 mg Oral Daily   Or  . aspirin  81 mg Oral Daily  . bisacodyl  10 mg Oral Daily   Or  . bisacodyl  10 mg Rectal Daily  . Chlorhexidine Gluconate Cloth  6 each Topical Daily  . guaiFENesin  1,200 mg Oral BID  . insulin aspart  0-24 Units Subcutaneous TID WC  . insulin detemir  15 Units Subcutaneous Daily  .  lactulose  20 g Oral Once  . losartan  12.5 mg Oral QHS  . mouth rinse  15 mL Mouth Rinse BID  . pantoprazole  40 mg Oral Daily  . rosuvastatin  20 mg Oral q1800  . sodium chloride flush  10-40 mL Intracatheter Q12H  . spironolactone  12.5 mg Oral Daily  . tamsulosin  0.4 mg Oral QHS  . warfarin  5 mg Oral q1800  . warfarin   Does not apply Once  . Warfarin - Physician Dosing Inpatient   Does not apply q1800    Infusions:   PRN Medications: docusate sodium, ondansetron (ZOFRAN) IV, oxyCODONE, pneumococcal 23 valent vaccine, sodium chloride flush, traMADol    Patient Profile   [name] is 72 year old with history of diabetes, embolic stroke, ICM, 3 vessel CAD, MVR admitted for scheduled CABG on 01/23/2018   Assessment/Plan   1. S/P  CABGx4  And bioprosthetic mitral valve replacement - No s/s of ischemia.    - On statin  - On coumadin.  - No BB for now.   2.  Anemia-expected blood loss - Hgb 8.9 02/01/18. No overt bleeding.   3. Thrombocytopenia  - Platelets 181 02/01/18  4. Chronic Systolic HF, ICM. -  Initially diagnosed 2014 and there was mention of viral. Possible NICM/ICM 08/2017 TEE EF 30-35%  01/2018 Post CABG ECHO 20-25% - Volume status OK. CVP 6-7. OK to pull PICC line.  - Continue 12.5 mg spiro daily.  - Continue 12.5 mg losartan at bedtime. Hold off on entresto with SBP in 80s.  - Start coreg 3.125 mg BID.  - Follow up renal function closely    5. H/O Embolic Stroke - On statin.  - On coumadin. No bleeding.   6. PAF - Rate controlled. On coumadin.  - EKG to further establish rhythm.  - INR 2.60   7. Diabetes.  - SSI  8. Deconditioning - PT recommending SNF.  - Possible CIR.   9. Constipation - Will give sorbitol today and add back daily regimen.   Medication concerns reviewed with patient and pharmacy team. Barriers identified: None at this time.   Length of Stay: [address]  Nathan SchoolMichael Andrew Tillery, PA-C  02/03/2018, 8:23 AM  Advanced Heart Failure Team Pager [phone] (M-F; 7a - 4p)  Please contact [facility] for night-coverage after hours (4p -7a ) and weekends on [email or web address]    Patient seen with NP, agree with the above note.    Main complaint this morning is constipation.  No chest pain or dyspnea.  He had to get a foley due to urinary retention.   CVP 6-7.   On exam, no JVD.  No edema.  Reg S1S2, no murmur.   1. Chronic systolic CHF: Ischemic cardiomyopathy. He is well-compensated on exam today with CVP 6-7. B - No need for Lasix today.  - Continue losartan 12.5 daily and spironolactone 12.5 daily.  - Add low dose Coreg 3.125 mg bid.   2. CAD: S/p CABG.  - Continue Crestor.  - He is on warfarin.  3. Atrial fibrillation: Paroxysmal. ?afib on telemetry today, somewhat  difficult to tell. Will get ECG.  If remains in afib persistently, could consider cardioversion down the road.  Not emergent as he is very stable currently from a CV perspective.  4. H/o CVA: 11/18, related to atrial fibrillation. Based on CT, there is concern for recurrent event versus progression of CT changes from prior stroke.  - Continue warfarin.  6. Urinary  retention: BPH.  Continue Flomax. Currently with indwellling foley.  7. Constipation: Will correct.   Marca Ancona 02/03/2018 8:38 AM

## 2018-02-03 NOTE — Progress Notes (Signed)
PMR Admission Coordinator Pre-Admission Assessment  Patient: Nathan [name] D Nathan [name]. is an 72 y.o., male MRN: [id number] DOB: 1946/01/14 Height: 5' 11.5" (181.6 cm) Weight: 88.5 kg                                                                                                                                                  Insurance Information HMO: X    PPO:      PCP:      IPA:      80/20:      OTHER:  PRIMARY: [employer] Medicare       Policy#: [id number]      Subscriber: Self CM Name: Rebeca AlertSunny Smith      Phone#: [phone]     Fax#: [phone] Pre-Cert#: [id number] given by Victorino DikeJennifer [phone]) for 7 days 02/03/18-02/09/18 with faxed updates due to Rebeca AlertSunny Smith     Employer: Retired Benefits:  Phone #: [phone]     Name: [facility] at [email or web address]. Date: 06/28/17       Deduct: $0      Out of Pocket Max: $4400      Life Max: N/A CIR: $345 a day, days 1-5; $0 a day, days 6+      SNF: $0 a day, days 1-20; $160 a day, days 21-48; $0 a day, days 49-100 Outpatient: Necessity      Co-Pay: $40 per visit Home Health: Necessity      Co-Pay: $0 DME: 80%     Co-Pay: 20% Providers: In-network   SECONDARY: None        Emergency Contact Information         Contact Information    Name Relation Home Work Mobile   Nathan [name],[name] Spouse [phone]  [phone]     Current Medical History  Patient Admitting Diagnosis: Debility  History of Present Illness: Nathan [name] is a 72 year old Left handed male with history of PAF, T2DM, chronic systolic CHF, 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 Donata ClayVan Trigt. Post op course significant for ABLA, thrombocytopenia, fluid overload, SOB, hypotension as well as development of MS changed with recurrent left sided weakness on 08/02. CT head done revealing interval development of hypoattenuation in right frontal region  felt to be extension of prior R-MCA stroke. On IV heparin to coumadin and continues to have intermittent A fib. Cardiology feels that patient may require cardioversion in the distant future and coreg added today. He has had bouts of confusion and was found to be retaining urine with I/O cath for [id number] cc in the past 24 hours. He continues to have poor po intake and constipation--no BM since admission. Therapy ongoing and patient noted to be debilitated. CIR recommended due to functional deficits and patient  admitted 02/03/18.  Past Medical History      Past Medical History:  Diagnosis Date  . Atrial fibrillation with RVR (HCC) 05/01/2017  . Cardiomyopathy (HCC) 05/01/2017  . CHF (congestive heart failure) (HCC)   . 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 Coronary artery disease   . DM (diabetes mellitus) type 2, uncontrolled, with ketoacidosis (HCC) 05/01/2017  . Essential hypertension 11/26/2016  . History of kidney stones   . HLD (hyperlipidemia) 05/01/2017  . IVCD (intraventricular conduction defect) 04/24/2015  . Mitral valve regurgitation   . Mixed dyslipidemia 11/26/2016  . Stroke (cerebrum) (HCC) 04/29/2017    Family History  family history includes Diabetes in his father; Hypertension in his father.  Prior Rehab/Hospitalizations:  Has the patient had major surgery during 100 days prior to admission? Yes  Current Medications   Current Facility-Administered Medications:  .  aspirin EC tablet 81 mg, 81 mg, Oral, Daily, 81 mg at 02/02/18 0859 **OR** aspirin chewable tablet 81 mg, 81 mg, Oral, Daily, Donata Clay, Theron Arista, MD, 81 mg at 02/03/18 1052 .  bisacodyl (DULCOLAX) EC tablet 10 mg, 10 mg, Oral, Daily, 10 mg at 02/03/18 1050 **OR** bisacodyl (DULCOLAX) suppository 10 mg, 10 mg, Rectal, Daily, Barrett, Erin R, PA-C .  carvedilol (COREG) tablet 3.125 mg, 3.125 mg, Oral, BID WC, Graciella Freer, PA-C, 3.125 mg at 02/03/18 6045 .   Chlorhexidine Gluconate Cloth 2 % PADS 6 each, 6 each, Topical, Daily, Donata Clay, Theron Arista, MD, 6 each at 02/03/18 1057 .  docusate sodium (COLACE) capsule 200 mg, 200 mg, Oral, Daily PRN, Donata Clay, Theron Arista, MD, 200 mg at 02/02/18 0900 .  guaiFENesin (MUCINEX) 12 hr tablet 1,200 mg, 1,200 mg, Oral, BID, Alleen Borne, MD, 1,200 mg at 02/03/18 1048 .  CBG monitoring, , , 4x Daily, AC & HS **AND** insulin aspart (novoLOG) injection 0-24 Units, 0-24 Units, Subcutaneous, TID WC, Alleen Borne, MD, 8 Units at 02/03/18 1142 .  insulin detemir (LEVEMIR) injection 15 Units, 15 Units, Subcutaneous, Daily, Alleen Borne, MD, 15 Units at 02/03/18 1057 .  losartan (COZAAR) tablet 12.5 mg, 12.5 mg, Oral, QHS, Clegg, Amy D, NP, 12.5 mg at 02/02/18 2113 .  MEDLINE mouth rinse, 15 mL, Mouth Rinse, BID, Donata Clay, Theron Arista, MD, 15 mL at 02/03/18 1059 .  ondansetron (ZOFRAN) injection 4 mg, 4 mg, Intravenous, Q6H PRN, Barrett, Erin R, PA-C, 4 mg at 02/03/18 0706 .  oxyCODONE (Oxy IR/ROXICODONE) immediate release tablet 5 mg, 5 mg, Oral, Q4H PRN, Donata Clay, Theron Arista, MD, 5 mg at 02/02/18 2113 .  pantoprazole (PROTONIX) EC tablet 40 mg, 40 mg, Oral, Daily, Barrett, Erin R, PA-C, 40 mg at 02/03/18 1049 .  pneumococcal 23 valent vaccine (PNU-IMMUNE) injection 0.5 mL, 0.5 mL, Intramuscular, Prior to discharge, Donata Clay, Theron Arista, MD .  polyethylene glycol (MIRALAX / GLYCOLAX) packet 17 g, 17 g, Oral, Daily, Graciella Freer, PA-C, 17 g at 02/03/18 1055 .  rosuvastatin (CRESTOR) tablet 20 mg, 20 mg, Oral, q1800, Clegg, Amy D, NP, 20 mg at 02/02/18 1707 .  sodium chloride flush (NS) 0.9 % injection 10-40 mL, 10-40 mL, Intracatheter, Q12H, Donata Clay, Theron Arista, MD, 10 mL at 02/03/18 1057 .  sodium chloride flush (NS) 0.9 % injection 10-40 mL, 10-40 mL, Intracatheter, PRN, Donata Clay, Theron Arista, MD .  spironolactone (ALDACTONE) tablet 12.5 mg, 12.5 mg, Oral, Daily, Clegg, Amy D, NP, 12.5 mg at 02/03/18 1056 .  tamsulosin (FLOMAX)  capsule 0.4 mg, 0.4 mg,  Oral, QHS, Barrett, Erin R, PA-C, 0.4 mg at 02/02/18 2113 .  traMADol (ULTRAM) tablet 50 mg, 50 mg, Oral, Q6H PRN, Alleen Borne, MD, 50 mg at 02/01/18 2127 .  warfarin (COUMADIN) tablet 5 mg, 5 mg, Oral, q1800, Barrett, Erin R, PA-C, 5 mg at 02/02/18 1708 .  warfarin (COUMADIN) video, , Does not apply, Once, Kerin Perna, MD .  Warfarin - Physician Dosing Inpatient, , Does not apply, q1800, Kerin Perna, MD  Patients Current Diet:     Diet Order                  DIET DYS 3 Room service appropriate? Yes; Fluid consistency: Thin  Diet effective now               Precautions / Restrictions Precautions Precautions: Fall, Sternal Precaution Booklet Issued: Yes (comment) Precaution Comments: left weak Restrictions Weight Bearing Restrictions: Yes(sternal precautions)   Has the patient had 2 or more falls or a fall with injury in the past year?No, 1 fall in tub prior to placing non-skid strips   Prior Activity Level Community (5-7x/wk): Prior to admission patient was out daily to run errands, visit friends, and go to church.  He needed assist with recall of somethings like recalling taking his medications mid-day.   Home Assistive Devices / Equipment Home Assistive Devices/Equipment: Shower chair without back Home Equipment: None  Prior Device Use: Indicate devices/aids used by the patient prior to current illness, exacerbation or injury? None of the above  Prior Functional Level Prior Function Level of Independence: Independent Comments: drives, likes to cook and golf, wife states pt was having memory deficits prior to admission  Self Care: Did the patient need help bathing, dressing, using the toilet or eating? Independent  Indoor Mobility: Did the patient need assistance with walking from room to room (with or without device)? Independent  Stairs: Did the patient need assistance with internal or external stairs (with or  without device)? Independent  Functional Cognition: Did the patient need help planning regular tasks such as shopping or remembering to take medications? Needed some help  Current Functional Level Cognition  Overall Cognitive Status: History of cognitive impairments - at baseline Orientation Level: Oriented X4 Following Commands: Follows one step commands with increased time, Follows one step commands inconsistently Safety/Judgement: Decreased awareness of safety, Decreased awareness of deficits General Comments: Pt more alert this session. Requiring increased cues and time thorughout session for ADLs and funcitonal mobility. During LB dressing, pt requiring Max cues to initate donning of sock.    Extremity Assessment (includes Sensation/Coordination)  Upper Extremity Assessment: Generalized weakness  Lower Extremity Assessment: Defer to PT evaluation LLE Deficits / Details: pt with decreaseds strength grossly 3/5 not formally assessed    ADLs  Overall ADL's : Needs assistance/impaired Eating/Feeding: Independent, Sitting Grooming: Set up, Supervision/safety, Sitting Upper Body Bathing: Minimal assistance, Sitting Lower Body Bathing: Maximal assistance, Sit to/from stand Lower Body Bathing Details (indicate cue type and reason): Max A for sit<>Stand.  Upper Body Dressing : Minimal assistance, Sitting Lower Body Dressing: Minimal assistance, Sit to/from stand, Moderate assistance Lower Body Dressing Details (indicate cue type and reason): Mod A for sit<>stand. Min A for donning/doffing socks while seated at recliner. Cues for adherance to sternal precautions Toilet Transfer: Moderate assistance, +2 for safety/equipment, RW(Simulated to recliner) Toilet Transfer Details (indicate cue type and reason): Pt requiring Mod A for power up into standing and then to maintain standing balance with mobility.  Functional  mobility during ADLs: Moderate assistance, Rolling walker, Cueing for  safety, Cueing for sequencing General ADL Comments: Pt performing LB dressing and functional mobility with Mod A for standing balance. Pt requiirng Max A to facilitate left LE during mobility as he fatigued.     Mobility  Overal bed mobility: Needs Assistance Bed Mobility: Supine to Sit Rolling: Min assist Sidelying to sit: Max assist, HOB elevated General bed mobility comments: Min a for LLE over EOB    Transfers  Overall transfer level: Needs assistance Equipment used: Rolling walker (2 wheeled) Transfers: Sit to/from Stand Sit to Stand: Min assist Stand pivot transfers: Max assist, +2 physical assistance General transfer comment: Min A to stand, progressing from last visit, however became dizzy and coudlnt tolerate BP, needed to sit back down. second trial, pt still dizzy.     Ambulation / Gait / Stairs / Wheelchair Mobility  Ambulation/Gait Ambulation/Gait assistance: Mod assist, +2 safety/equipment Gait Distance (Feet): 5 Feet Assistive device: Rolling walker (2 wheeled) Gait Pattern/deviations: Step-to pattern, Decreased stride length, Trunk flexed, Narrow base of support General Gait Details: defered 2/2 orthostatic hypotension  Gait velocity interpretation: <1.31 ft/sec, indicative of household ambulator    Posture / Balance Dynamic Sitting Balance Sitting balance - Comments: pt with tendency for left lean but able to correct to midline with cues and maintain Balance Overall balance assessment: Needs assistance Sitting-balance support: No upper extremity supported, Feet supported Sitting balance-Leahy Scale: Fair Sitting balance - Comments: pt with tendency for left lean but able to correct to midline with cues and maintain Postural control: Left lateral lean Standing balance support: Bilateral upper extremity supported, During functional activity Standing balance-Leahy Scale: Poor Standing balance comment: Reliant on UE support    Special needs/care  consideration BiPAP/CPAP: No CPM: No Continuous Drip IV: No Dialysis: No         Life Vest: No Oxygen: No Special Bed: No Trach Size: N0 Wound Vac (area): No      Skin: Dry, Abrasion to right lower leg, Ecchymosis to right medical thigh, Moisture associated skin damage to mid lower sacrum                              Bowel mgmt: Continent, last BM 01/29/18 Bladder mgmt: Continent  Diabetic mgmt: Yes, oral medication prior to admission      Previous Home Environment Living Arrangements: Spouse/significant other Available Help at Discharge: Family, Available 24 hours/day Type of Home: House Home Layout: One level Home Access: Level entry Bathroom Shower/Tub: Engineer, manufacturing systems: Standard Home Care Services: No  Discharge Living Setting Plans for Discharge Living Setting: Patient's home, House, Lives with (comment)(Spouse and son ) Type of Home at Discharge: House Discharge Home Layout: One level Discharge Home Access: Level entry Discharge Bathroom Shower/Tub: Tub/shower unit(with bench ) Discharge Bathroom Toilet: Standard Discharge Bathroom Accessibility: Yes How Accessible: Accessible via walker Does the patient have any problems obtaining your medications?: No  Social/Family/Support Systems Patient Roles: Spouse, Parent Contact Information: Spouse: Primary school teacher  Anticipated Caregiver: Son: Theatre stage manager  Ability/Limitations of Caregiver: Spouse works days as a Investment banker, operational: 24/7 Discharge Plan Discussed with Primary Caregiver: Yes Is Caregiver In Agreement with Plan?: Yes Does Caregiver/Family have Issues with Lodging/Transportation while Pt is in Rehab?: No  Goals/Additional Needs Patient/Family Goal for Rehab: PT/OT/SLP: Supervision-Min A  Expected length of stay: 10-14 days  Cultural Considerations: None Dietary Needs: Dys.3 textures and thin liquids with Carb.Mod. diet  restrictions  Equipment Needs: TBD Pt/Family Agrees to Admission and  willing to participate: Yes Program Orientation Provided & Reviewed with Pt/Caregiver Including Roles  & Responsibilities: Yes Additional Information Needs: Spouse returns to work 8/19 and son is available for family education and training  Information Needs to be Provided By: Team FYI  Decrease burden of Care through IP rehab admission: No  Possible need for SNF placement upon discharge: No  Patient Condition: This patient's medical and functional status has changed since the consult dated: 01/31/18 at 1055 in which the Rehabilitation Physician determined and documented that the patient's condition is appropriate for intensive rehabilitative care in an inpatient rehabilitation facility. See "History of Present Illness" (above) for medical update. Functional changes are: Min A +2 16 feet. Patient's medical and functional status update has been discussed with the Rehabilitation physician and patient remains appropriate for inpatient rehabilitation. Will admit to inpatient rehab today.  Preadmission Screen Completed By:  Fae Pippin, 02/03/2018 12:40 PM ______________________________________________________________________   Discussed status with Dr. Riley Kill on 02/03/18 at 1615 and received telephone approval for admission today.  Admission Coordinator:  Fae Pippin, time 1615/Date 02/03/18           Cosigned by: Ranelle Oyster, MD at 02/03/2018 4:40 PM  Revision History

## 2018-02-03 NOTE — H&P (Signed)
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 Prescott Gum. Post op course significant for ABLA, thrombocytopenia,  fluid overload, SOB, hypotension as well as development of MS changed with recurrent left sided weakness on 08/02. CT head done revealing interval development of hypoattenuation in right frontal region felt to be extension of prior R-MCA stroke.  On IV heparin to coumadin and continues to have intermittent A fib. Cardiology feels that patient may require cardioversion in the distant future and coreg added today. He has had bouts of confusion and was found to be retaining urine with I/O cath for [phone] cc in the past 24 hours. He continues to have poor po intake and constipation--no BM since admission. Therapy ongoing and patient noted to be debilitated. CIR recommended due to functional deficits.     Review of Systems  Constitutional: Positive for malaise/fatigue.  HENT: Negative for hearing loss and tinnitus.   Eyes: Negative for blurred vision and double vision.  Respiratory: Negative for sputum production and shortness of breath.   Cardiovascular: Positive for leg swelling. Negative for chest pain and palpitations.  Gastrointestinal: Positive for abdominal pain and constipation ("needs someone to get it out"). Negative for heartburn and nausea.  Musculoskeletal: Negative for back pain, joint pain, myalgias and neck pain.  Neurological: Positive for focal weakness (left sided weakness getting back to baseline) and weakness.  Psychiatric/Behavioral: Negative for depression. The patient does not have  insomnia.     Past Medical History:  Diagnosis Date  . Atrial fibrillation with RVR ([place]) 05/01/2017  . Cardiomyopathy ([place]) 05/01/2017  . CHF (congestive heart failure) ([place])   . Chronic combined systolic and diastolic heart failure ([place]) 04/24/2015  . Coronary artery disease   . DM (diabetes mellitus) type 2, uncontrolled, with ketoacidosis ([place]) 05/01/2017  . Essential hypertension 11/26/2016  . History of kidney stones   . HLD (hyperlipidemia) 05/01/2017  . IVCD (intraventricular conduction defect) 04/24/2015  . Mitral valve regurgitation   . Mixed dyslipidemia 11/26/2016  . Stroke (cerebrum) ([place]) 04/29/2017    Past Surgical History:  Procedure Laterality Date  . BRAIN SURGERY    . CARDIAC CATHETERIZATION    . CORONARY ARTERY BYPASS GRAFT N/A 01/23/2018   Procedure: CORONARY ARTERY BYPASS GRAFTING (CABG) x 4 WITH ENDOSCOPIC HARVESTING OF RIGHT GREATER SAPHENOUS VEIN: LIMA TO LAD, SVG TO RCA, SVG TO DIAG, SVG TO OM ;  Surgeon: Ivin Poot, MD;  Location: [facility];  Service: Open Heart Surgery;  Laterality: N/A;  . IR PERCUTANEOUS ART THROMBECTOMY/INFUSION INTRACRANIAL INC DIAG ANGIO  04/29/2017  . IR RADIOLOGIST EVAL & MGMT  05/17/2017  . IR US GUIDE VASC ACCESS LEFT  04/29/2017  . IR US GUIDE VASC ACCESS RIGHT  04/29/2017  . LEFT ATRIAL APPENDAGE OCCLUSION Left 01/23/2018   Procedure: LEFT ATRIAL APPENDAGE OCCLUSION USING ATRICURE ATRICLIP PRO2 LAA EXCLUSION SYSTEM  SIZE 40, LOT # [id number], CAT # [id number], EXP. DATE 2020-04-28;  Surgeon: Prescott Gum, Collier Salina, MD;  Location: [facility];  Service: Open Heart Surgery;  Laterality: Left;  . MITRAL VALVE REPLACEMENT N/A 01/23/2018   Procedure: MITRAL VALVE (MV)  REPLACEMENT USING MAGNA MITRAL EASE PERICARDIAL BIOPROSTHESIS, MODEL [id number], SIZE 29 MM, SERIAL # [id number], EXPIRATION  DATE 2021-03-03.;  Surgeon: Ivin Poot, MD;  Location: [facility];  Service: Open Heart Surgery;  Laterality: N/A;  . MULTIPLE EXTRACTIONS WITH ALVEOLOPLASTY N/A 12/26/2017    Procedure: Extraction of tooth #'s 4,6-11, 18 -27, 30, and 31 with alveoloplasty;  Surgeon: Lenn Cal, DDS;  Location: [facility];  Service: Oral Surgery;  Laterality: N/A;  . RADIOLOGY WITH ANESTHESIA N/A 04/29/2017   Procedure: IR WITH ANESTHESIA;  Surgeon: Radiologist, Medication, MD;  Location: [facility];  Service: Radiology;  Laterality: N/A;  . RIGHT/LEFT HEART CATH AND CORONARY ANGIOGRAPHY N/A 08/18/2017   Procedure: RIGHT/LEFT HEART CATH AND CORONARY ANGIOGRAPHY;  Surgeon: Nelva Bush, MD;  Location: [facility];  Service: Cardiovascular;  Laterality: N/A;  . TEE WITHOUT CARDIOVERSION N/A 09/22/2017   Procedure: TRANSESOPHAGEAL ECHOCARDIOGRAM (TEE);  Surgeon: Lelon Perla, MD;  Location: [facility];  Service: Cardiovascular;  Laterality: N/A;  . TEE WITHOUT CARDIOVERSION N/A 01/23/2018   Procedure: TRANSESOPHAGEAL ECHOCARDIOGRAM (TEE);  Surgeon: Prescott Gum, Collier Salina, MD;  Location: [facility];  Service: Open Heart Surgery;  Laterality: N/A;  . TONSILLECTOMY      Family History  Problem Relation Age of Onset  . Hypertension Father   . Diabetes Father     Social History:  Married. Independent but limited PTA. He reports that he quit smoking about 25 years ago. He has never used smokeless tobacco. He reports that he drinks about 3.0 standard drinks of alcohol per week. He reports that he does not use drugs.   Allergies  Allergen Reactions  . Fentanyl Shortness Of Breath  . Promethazine Hcl Anaphylaxis and Other (See Comments)    Cardiac arrest  . Foeniculum Vulgare Other (See Comments)    Fennel bulbs--nausea only  . Metformin And Related Diarrhea  . Penicillins Nausea Only    Has patient had a PCN reaction causing immediate rash, facial/tongue/throat swelling, SOB or lightheadedness with hypotension: no Has patient had a PCN reaction causing severe rash involving mucus membranes or skin necrosis: no Has patient had a PCN reaction that required hospitalization: no Has  patient had a PCN reaction occurring within the last 10 years: yes If all of the above answers are "NO", then may proceed with Cephalosporin use.   . Sulfa Antibiotics Other (See Comments)    G.I. Upset    Medications Prior to Admission  Medication Sig Dispense Refill  . amiodarone (PACERONE) 200 MG tablet Take 1 tablet (200 mg total) by mouth daily. 90 tablet 3  . aspirin EC 81 MG tablet Take 81 mg by mouth daily.    . carvedilol (COREG) 6.25 MG tablet Take 1 tablet (6.25 mg total) by mouth 2 (two) times daily. 60 tablet 3  . glipiZIDE (GLUCOTROL XL) 10 MG 24 hr tablet Take 10 mg by mouth daily with breakfast.   3  . lisinopril (PRINIVIL,ZESTRIL) 5 MG tablet Take 2.5 mg by mouth daily.    . repaglinide (PRANDIN) 0.5 MG tablet Take 1 tablet by mouth 3 (three) times daily with meals.   3  . simvastatin (ZOCOR) 40 MG tablet Take 0.5 tablets (20 mg total) by mouth at bedtime. 45 tablet 1  . tamsulosin (FLOMAX) 0.4 MG CAPS capsule Take 0.4 mg by mouth at bedtime.   3  . acetaminophen (TYLENOL) 325 MG tablet Take 650 mg by mouth every 6 (six) hours as needed (for pain.).     [name]  cyclobenzaprine (FLEXERIL) 10 MG tablet Take 10 mg by mouth 2 (two) times daily as needed.  0    Drug Regimen Review  Drug regimen was reviewed and remains appropriate with no significant issues identified  Home: Home Living Family/patient expects to be discharged to:: Private residence Living Arrangements: Spouse/significant other Available Help at Discharge: Family, Available 24 hours/day Type of Home: House Home Access: Level entry Home Layout: One level Bathroom Shower/Tub: Chiropodist: Standard Home Equipment: None   Functional History: Prior Function Level of Independence: Independent Comments: drives, likes to cook and golf, wife states pt was having memory deficits prior to admission  Functional Status:  Mobility: Bed Mobility Overal bed mobility: Needs Assistance Bed  Mobility: Supine to Sit Rolling: Min assist Sidelying to sit: Max assist, HOB elevated General bed mobility comments: Min a for LLE over EOB Transfers Overall transfer level: Needs assistance Equipment used: Rolling walker (2 wheeled) Transfers: Sit to/from Stand Sit to Stand: Min assist Stand pivot transfers: Max assist, +2 physical assistance General transfer comment: Min A to stand, progressing from last visit, however became dizzy and coudlnt tolerate BP, needed to sit back down. second trial, pt still dizzy.  Ambulation/Gait Ambulation/Gait assistance: Mod assist, +2 safety/equipment Gait Distance (Feet): 5 Feet Assistive device: Rolling walker (2 wheeled) Gait Pattern/deviations: Step-to pattern, Decreased stride length, Trunk flexed, Narrow base of support General Gait Details: defered 2/2 orthostatic hypotension  Gait velocity interpretation: <1.31 ft/sec, indicative of household ambulator    ADL: ADL Overall ADL's : Needs assistance/impaired Eating/Feeding: Independent, Sitting Grooming: Set up, Supervision/safety, Sitting Upper Body Bathing: Minimal assistance, Sitting Lower Body Bathing: Maximal assistance, Sit to/from stand Lower Body Bathing Details (indicate cue type and reason): Max A for sit<>Stand.  Upper Body Dressing : Minimal assistance, Sitting Lower Body Dressing: Minimal assistance, Sit to/from stand, Moderate assistance Lower Body Dressing Details (indicate cue type and reason): Mod A for sit<>stand. Min A for donning/doffing socks while seated at recliner. Cues for adherance to sternal precautions Toilet Transfer: Moderate assistance, +2 for safety/equipment, RW(Simulated to recliner) Toilet Transfer Details (indicate cue type and reason): Pt requiring Mod A for power up into standing and then to maintain standing balance with mobility.  Functional mobility during ADLs: Moderate assistance, Rolling walker, Cueing for safety, Cueing for sequencing General ADL  Comments: Pt performing LB dressing and functional mobility with Mod A for standing balance. Pt requiirng Max A to facilitate left LE during mobility as he fatigued.   Cognition: Cognition Overall Cognitive Status: History of cognitive impairments - at baseline Orientation Level: Oriented X4 Cognition Arousal/Alertness: Awake/alert Behavior During Therapy: WFL for tasks assessed/performed Overall Cognitive Status: History of cognitive impairments - at baseline Area of Impairment: Memory, Following commands, Safety/judgement Orientation Level: Disoriented to, Time Memory: Decreased short-term memory, Decreased recall of precautions Following Commands: Follows one step commands with increased time, Follows one step commands inconsistently Safety/Judgement: Decreased awareness of safety, Decreased awareness of deficits General Comments: Pt more alert this session. Requiring increased cues and time thorughout session for ADLs and funcitonal mobility. During LB dressing, pt requiring Max cues to initate donning of sock.   Blood pressure 108/60, pulse (!) 54, temperature (!) 97.3 F (36.3 C), temperature source Oral, resp. rate 18, height 5' 11.5" (1.816 m), weight 88.5 kg, SpO2 99 %. Physical Exam  Nursing note and vitals reviewed. Constitutional: He is oriented to person, place, and time. He appears well-developed and well-nourished. No distress.     HENT:  Head: Normocephalic  and atraumatic.  Eyes: Pupils are equal, round, and reactive to light. EOM are normal. No scleral icterus.  Neck: Normal range of motion. No JVD present. No tracheal deviation present. No thyromegaly present.  Cardiovascular: Normal rate. An irregular rhythm present. Exam reveals no friction rub.  No murmur heard. IRR IRR  Respiratory: Effort normal. No respiratory distress. He has no wheezes. He exhibits tenderness.  GI: Soft. He exhibits no distension. There is no tenderness. There is no rebound.    Musculoskeletal: Edema: 1+ edema with resolving ecchymosis RLE.  Edema and ecchymoses RLE  Neurological: He is alert and oriented to person, place, and time. No cranial nerve deficit. Coordination normal.  Speech clear. Oriented to date and has reasonable awareness of deficits. He is able to follow basic commands without difficulty. LUE 4 to 4+/5. LLE 4/5. RUE 4+/5. RLE 2-3/5 with limitations due to pain. Sensory exam grossly normal.   Skin: He is not diaphoretic.  RLE graft donor sites intact with staples. Bruising noted around leg/thigh. Chest incision CDI  Psychiatric: He has a normal mood and affect. His behavior is normal.  Perhaps a little delayed cognitively    Results for orders placed or performed during the hospital encounter of 01/23/18 (from the past 48 hour(s))  Glucose, capillary     Status: Abnormal   Collection Time: 02/01/18  4:21 PM  Result Value Ref Range   Glucose-Capillary 168 (H) 70 - 99 mg/dL   Comment 1 Capillary Specimen   Glucose, capillary     Status: Abnormal   Collection Time: 02/01/18 10:17 PM  Result Value Ref Range   Glucose-Capillary 119 (H) 70 - 99 mg/dL   Comment 1 Notify RN   Protime-INR     Status: Abnormal   Collection Time: 02/02/18  4:20 AM  Result Value Ref Range   Prothrombin Time 22.9 (H) 11.4 - 15.2 seconds   INR 2.04     Comment: Performed at [facility] [address], [place], [place] [address]  Basic metabolic panel     Status: Abnormal   Collection Time: 02/02/18  4:20 AM  Result Value Ref Range   Sodium 133 (L) 135 - 145 mmol/L   Potassium 4.2 3.5 - 5.1 mmol/L   Chloride 97 (L) 98 - 111 mmol/L   CO2 27 22 - 32 mmol/L   Glucose, Bld 122 (H) 70 - 99 mg/dL   BUN 19 8 - 23 mg/dL   Creatinine, Ser 0.84 0.61 - 1.24 mg/dL   Calcium 8.4 (L) 8.9 - 10.3 mg/dL   GFR calc non Af Amer >60 >60 mL/min   GFR calc Af Amer >60 >60 mL/min    Comment: (NOTE) The eGFR has been calculated using the CKD EPI equation. This calculation has  not been validated in all clinical situations. eGFR's persistently <60 mL/min signify possible Chronic Kidney Disease.    Anion gap 9 5 - 15    Comment: Performed at [facility] [address] [place], [place] [address]  Glucose, capillary     Status: Abnormal   Collection Time: 02/02/18  6:42 AM  Result Value Ref Range   Glucose-Capillary 133 (H) 70 - 99 mg/dL   Comment 1 Notify RN   Glucose, capillary     Status: Abnormal   Collection Time: 02/02/18  7:36 AM  Result Value Ref Range   Glucose-Capillary 137 (H) 70 - 99 mg/dL   Comment 1 Capillary Specimen    Comment 2 Notify RN  Glucose, capillary     Status: Abnormal   Collection Time: 02/02/18 12:41 PM  Result Value Ref Range   Glucose-Capillary 137 (H) 70 - 99 mg/dL   Comment 1 Capillary Specimen    Comment 2 Notify RN   Glucose, capillary     Status: Abnormal   Collection Time: 02/02/18  3:37 PM  Result Value Ref Range   Glucose-Capillary 155 (H) 70 - 99 mg/dL   Comment 1 Capillary Specimen    Comment 2 Notify RN   Glucose, capillary     Status: Abnormal   Collection Time: 02/02/18  9:59 PM  Result Value Ref Range   Glucose-Capillary 164 (H) 70 - 99 mg/dL   Comment 1 Notify RN   Protime-INR     Status: Abnormal   Collection Time: 02/03/18  5:31 AM  Result Value Ref Range   Prothrombin Time 27.6 (H) 11.4 - 15.2 seconds   INR 2.60     Comment: Performed at [facility], [place] [address] [place], [place] [address]  Glucose, capillary     Status: Abnormal   Collection Time: 02/03/18  6:37 AM  Result Value Ref Range   Glucose-Capillary 144 (H) 70 - 99 mg/dL   Comment 1 Notify RN   CBC     Status: Abnormal   Collection Time: 02/03/18  8:46 AM  Result Value Ref Range   WBC 10.7 (H) 4.0 - 10.5 K/uL   RBC 3.15 (L) 4.22 - 5.81 MIL/uL   Hemoglobin 9.5 (L) 13.0 - 17.0 g/dL   HCT 29.3 (L) 39.0 - 52.0 %   MCV 93.0 78.0 - 100.0 fL   MCH 30.2 26.0 - 34.0 pg   MCHC 32.4 30.0 - 36.0 g/dL   RDW 13.2 11.5 - 15.5  %   Platelets 163 150 - 400 K/uL    Comment: Performed at [facility], [place] [address], [place], [place] [address]  Basic metabolic panel     Status: Abnormal   Collection Time: 02/03/18  8:46 AM  Result Value Ref Range   Sodium 131 (L) 135 - 145 mmol/L   Potassium 4.5 3.5 - 5.1 mmol/L   Chloride 96 (L) 98 - 111 mmol/L   CO2 26 22 - 32 mmol/L   Glucose, Bld 201 (H) 70 - 99 mg/dL   BUN 16 8 - 23 mg/dL   Creatinine, Ser 0.96 0.61 - 1.24 mg/dL   Calcium 8.8 (L) 8.9 - 10.3 mg/dL   GFR calc non Af Amer >60 >60 mL/min   GFR calc Af Amer >60 >60 mL/min    Comment: (NOTE) The eGFR has been calculated using the CKD EPI equation. This calculation has not been validated in all clinical situations. eGFR's persistently <60 mL/min signify possible Chronic Kidney Disease.    Anion gap 9 5 - 15    Comment: Performed at [facility] [address] [place], [place] [address]  Glucose, capillary     Status: Abnormal   Collection Time: 02/03/18 11:32 AM  Result Value Ref Range   Glucose-Capillary 211 (H) 70 - 99 mg/dL   Comment 1 Capillary Specimen    No results found.     Medical Problem List and Plan: 1.  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  DVT Prophylaxis/Anticoagulation: Pharmaceutical: Coumadin 3. Pain Management: prn tylenol. Oxycodone or tramadol for more severe pain 4. Mood:  LCSW to follow for  evaluation and support.  5. Neuropsych: This patient is not fully capable of making decisions on his own behalf. 6. Skin/Wound Care: Monitor incisions daily and watch for signs of infection. Add protein supplements between meals 7. Fluids/Electrolytes/Nutrition: Strict I/O. Intake poor due to recent teeth extraction. Check lytes in am.  8. CAD s/p CABG with MVR: Monitor for symptoms with activity. ON coumadin and Lipitor--coreg added today.  9.PAF: In A fib today but rate controlled on  warfarin--coreg added today.  10. R-MCA stroke with extension: Left sided weakness resolving. On coumadin.  11. Chronic systolic CHF: Pulmonary edema improving --encourage IS. Monitor weights daily with strict I/O. On Spironolactone, losartan, coreg and Lipitor.  12. H/o BPH/Acute Urinary retention: Question of delirium--will check UA/UCS. Foley was placed today to help decompress bladder. Continue foley for 48 hours--d/c Sunday am and start bladder program. Work on resolving constipation.  13. Constipation: Will increase mirlax to bid--suppository today. and further augment bowel program as likely contributing to retention.  14. ABLA/Leucocytosis: Monitor for signs of infection. Continue to monitor H/H for recovery.  15. Hyponatremia: Continue to monitor for now.  16. T2DM: Monitor BS ac/hs. On levemir daily with SSI for tighter BS control.       Bary Leriche, PA-C 02/03/2018

## 2018-02-03 NOTE — Progress Notes (Signed)
PT Cancellation Note  Patient Details Name: Nathan PhilipsRandleman D Gledhill Jr. MRN: [id number] DOB: [birth year]   Cancelled Treatment:    Reason Eval/Treat Not Completed: Other (comment)(attempted to see 2x this morning with pt fatigued after returning to bed and then toileting with RN reporting hypotension in standing)   [name] 02/03/2018, 9:56 AM  Delaney MeigsMaija Tabor Gamaliel Charney, PT [phone]

## 2018-02-03 NOTE — Progress Notes (Addendum)
Inpatient Rehabilitation  I have received insurance authorization to admit patient to IP Rehab and have abed to offer.  I await acute medical clearance prior to hopeful admission today.  Plan to update team when I know.  Call if questions.   Note order to discharge to IP Rehab.  Notified team and plan to proceed with admission.    Charlane FerrettiMelissa Felma Pfefferle, M.A., CCC/SLP Admission Coordinator  [facility]  Cell [phone]

## 2018-02-03 NOTE — Progress Notes (Signed)
Occupational Therapy Assessment and Plan  Patient Details  Name: Nathan [name]. MRN: [id number] Date of Birth: Apr 17, 1946  OT Diagnosis: ataxia, cognitive deficits, hemiplegia affecting dominant side and muscle weakness (generalized) Rehab Potential: Rehab Potential (ACUTE ONLY): Excellent ELOS: 14-17 days   Today's Date: 02/04/2018 OT Individual Time: 1300-1415 OT Individual Time Calculation (min): 75 min     Problem List:  Patient Active Problem List   Diagnosis Date Noted  . Debility 02/03/2018  . 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 Pressure injury of skin 02/02/2018  . Malnutrition of moderate degree 02/01/2018  . Coronary artery disease involving native coronary artery of native heart with angina pectoris ([place])   . S/P CABG (coronary artery bypass graft)   . Atrial fibrillation ([place])   . Chronic combined systolic and diastolic CHF (congestive heart failure) ([place])   . Diabetes mellitus type 2 in nonobese (HCC)   . Essential hypertension   . History of CVA (cerebrovascular accident)   . Acute blood loss anemia   . Hyponatremia   . S/P left atrial appendage ligation 01/26/2018  . S/P CABG x 4 01/23/2018  . S/P MVR (mitral valve replacement) 01/23/2018  . Mitral valve insufficiency   . On amiodarone therapy 07/28/2017  . CAD in native artery 07/28/2017  . Chronic anticoagulation 05/18/2017  . LBBB (left bundle branch block) 05/18/2017  . Hospital discharge follow-up 05/09/2017  . Paroxysmal atrial fibrillation ([place]) 05/01/2017  . Dilated cardiomyopathy ([place]) 05/01/2017  . DM (diabetes mellitus) type 2, uncontrolled, with ketoacidosis ([place]) 05/01/2017  . Hypotension 05/01/2017  . HLD (hyperlipidemia) 05/01/2017  . Cerebrovascular accident (CVA) due to embolism of right middle cerebral artery ([place]) 04/29/2017  . Hypertensive heart disease with heart failure ([place]) 11/26/2016  . Mixed dyslipidemia 11/26/2016  . Chronic  systolic congestive heart failure ([place]) 04/24/2015  . IVCD (intraventricular conduction defect) 04/24/2015    Past Medical History:  Past Medical History:  Diagnosis Date  . Atrial fibrillation with RVR ([place]) 05/01/2017  . Cardiomyopathy ([place]) 05/01/2017  . CHF (congestive heart failure) ([place])   . Chronic combined systolic and diastolic heart failure ([place]) 04/24/2015  . Coronary artery disease   . DM (diabetes mellitus) type 2, uncontrolled, with ketoacidosis ([place]) 05/01/2017  . Essential hypertension 11/26/2016  . History of kidney stones   . HLD (hyperlipidemia) 05/01/2017  . IVCD (intraventricular conduction defect) 04/24/2015  . Mitral valve regurgitation   . Mixed dyslipidemia 11/26/2016  . Stroke (cerebrum) ([place]) 04/29/2017   Past Surgical History:  Past Surgical History:  Procedure Laterality Date  . BRAIN SURGERY    . CARDIAC CATHETERIZATION    . CORONARY ARTERY BYPASS GRAFT N/A 01/23/2018   Procedure: CORONARY ARTERY BYPASS GRAFTING (CABG) x 4 WITH ENDOSCOPIC HARVESTING OF RIGHT GREATER SAPHENOUS VEIN: LIMA TO LAD, SVG TO RCA, SVG TO DIAG, SVG TO OM ;  Surgeon: Ivin Poot, MD;  Location: [facility];  Service: Open Heart Surgery;  Laterality: N/A;  . IR PERCUTANEOUS ART THROMBECTOMY/INFUSION INTRACRANIAL INC DIAG ANGIO  04/29/2017  . IR RADIOLOGIST EVAL & MGMT  05/17/2017  . IR US GUIDE VASC ACCESS LEFT  04/29/2017  . IR US GUIDE VASC ACCESS RIGHT  04/29/2017  . LEFT ATRIAL APPENDAGE OCCLUSION Left 01/23/2018   Procedure: LEFT ATRIAL APPENDAGE OCCLUSION USING ATRICURE ATRICLIP PRO2 LAA EXCLUSION SYSTEM  SIZE 40, LOT # [id number], CAT # [id number], EXP. DATE 2020-04-28;  Surgeon: Prescott Gum, Collier Salina, MD;  Location: [facility];  Service: Open Heart Surgery;  Laterality: Left;  . MITRAL VALVE REPLACEMENT N/A 01/23/2018   Procedure: MITRAL VALVE (MV) REPLACEMENT USING MAGNA MITRAL EASE PERICARDIAL BIOPROSTHESIS, MODEL [id number], SIZE 29 MM, SERIAL # [id number], EXPIRATION  DATE 2021-03-03.;  Surgeon: Ivin Poot, MD;  Location: [facility];  Service: Open Heart Surgery;  Laterality: N/A;  . MULTIPLE EXTRACTIONS WITH ALVEOLOPLASTY N/A 12/26/2017   Procedure: Extraction of tooth #'s 4,6-11, 18 -27, 30, and 31 with alveoloplasty;  Surgeon: Lenn Cal, DDS;  Location: [place] [place];  Service: Oral Surgery;  Laterality: N/A;  . RADIOLOGY WITH ANESTHESIA N/A 04/29/2017   Procedure: IR WITH ANESTHESIA;  Surgeon: Radiologist, Medication, MD;  Location: [facility];  Service: Radiology;  Laterality: N/A;  . RIGHT/LEFT HEART CATH AND CORONARY ANGIOGRAPHY N/A 08/18/2017   Procedure: RIGHT/LEFT HEART CATH AND CORONARY ANGIOGRAPHY;  Surgeon: Nelva Bush, MD;  Location: [facility];  Service: Cardiovascular;  Laterality: N/A;  . TEE WITHOUT CARDIOVERSION N/A 09/22/2017   Procedure: TRANSESOPHAGEAL ECHOCARDIOGRAM (TEE);  Surgeon: Lelon Perla, MD;  Location: [facility] ENDOSCOPY;  Service: Cardiovascular;  Laterality: N/A;  . TEE WITHOUT CARDIOVERSION N/A 01/23/2018   Procedure: TRANSESOPHAGEAL ECHOCARDIOGRAM (TEE);  Surgeon: Prescott Gum, Collier Salina, MD;  Location: [facility];  Service: Open Heart Surgery;  Laterality: N/A;  . TONSILLECTOMY      Assessment & Plan Clinical Impression: Patient is a 72 y.o. year old male with history of PAF, O0BB, chronic systolic CHF, embolic stroke 09/8887 with mild memory deficits, CAD/ICM with mitral stenosis who was admitted on 07/29/196 for CABG X 4 with bioprosthetic mitral valve replacement by Dr. Prescott Gum. Post op course significant for ABLA, thrombocytopenia,  fluid overload, SOB, hypotension as well as development of MS changed with recurrent left sided weakness on 08/02. CT head done revealing interval development of hypoattenuation in right frontal region felt to be extension of prior R-MCA stroke.  On IV heparin to coumadin and continues to have intermittent A fib. Cardiology feels that patient may require cardioversion in the distant future and coreg added today. Patient transferred to CIR on  02/03/2018 .    Patient currently requires max with basic self-care skills secondary to muscle weakness, decreased cardiorespiratoy endurance, impaired timing and sequencing, unbalanced muscle activation, ataxia and decreased coordination, decreased problem solving, decreased safety awareness and decreased memory and decreased sitting balance, decreased standing balance, decreased postural control, hemiplegia, decreased balance strategies and difficulty maintaining precautions.  Prior to hospitalization, patient could complete BADL with modified independent .  Patient will benefit from skilled intervention to increase independence with basic self-care skills prior to discharge home with care partner.  Anticipate patient will require 24 hour supervision and follow up home health.  OT - End of Session Endurance Deficit: Yes Endurance Deficit Description: fatigues quickly, multiple rest breaks within BADL tasks OT Assessment Rehab Potential (ACUTE ONLY): Excellent OT Patient demonstrates impairments in the following area(s): Balance;Cognition;Endurance;Motor;Pain;Safety OT Basic ADL's Functional Problem(s): Eating;Grooming;Bathing;Dressing;Toileting OT Transfers Functional Problem(s): Toilet;Tub/Shower OT Additional Impairment(s): Fuctional Use of Upper Extremity OT Plan OT Intensity: Minimum of 1-2 x/day, 45 to 90 minutes OT Frequency: 5 out of 7 days OT Duration/Estimated Length of Stay: 14-17 days OT Treatment/Interventions: Cognitive remediation/compensation;Community reintegration;Discharge planning;DME/adaptive equipment instruction;Functional mobility training;Neuromuscular re-education;Patient/family education;Self Care/advanced ADL retraining;Therapeutic Activities;Therapeutic Exercise;UE/LE Coordination activities;UE/LE Strength taining/ROM;Wheelchair propulsion/positioning OT Self Feeding Anticipated Outcome(s): Mod I OT Basic Self-Care Anticipated Outcome(s): supervision OT Toileting  Anticipated Outcome(s): supervision OT Bathroom Transfers Anticipated Outcome(s): supervision  OT Recommendation Recommendations for Other Services: Neuropsych  consult Patient destination: Home Follow Up Recommendations: Home health OT Equipment Details: likely a tub transfer bench, but TBD  Skilled Therapeutic Intervention Pt's spouse present throughout session. OT eval completed with treatment session focused on activity tolerance and modified bathing/dressing. Pt reported not feeling well but agreeable to try to participate. BP checked in supine at 90/57- which nursing reports has been normal for pt. LB bathing/dressing completed in bed with pt needing mod A for rolling and assistance to wash buttocks and lower legs. Pt needed assistance also to thread b LEs into pants. Facilitated bridging with OT supporting LLE and pt able to lift bottom completely off of bed and pull pants up over hips. Mod A to come to sitting EOB with verbal cues for sternal precautions. UB bathing/dressing completed seated EOB. BP in sitting 106/68. Pt tolerated sitting EOB for ~ 20 mins, then stated he was feeling faint and needed to lie down. Assisted pt back to bed with mod A.  Discussed rehab process, OT purpose, POC, and goals with pt and spouse. Pt left semi-reclined in bed with bed alarm on and needs met.   OT Evaluation Precautions/Restrictions  Precautions Precautions: Fall;Sternal Restrictions Weight Bearing Restrictions: Yes Other Position/Activity Restrictions: sternal precautions Pain Pain Assessment Pain Scale: 0-10 None/denies pain Home Living/Prior Functioning Home Living Family/patient expects to be discharged to:: Private residence Living Arrangements: Spouse/significant other Available Help at Discharge: Family, Available 24 hours/day Type of Home: House Home Access: Level entry Home Layout: One level Bathroom Shower/Tub: Chiropodist: Standard  Lives With: Spouse,  Family IADL History Current License: Yes IADL Comments: Enjoys watching TV, fishing, enjoys eating and cooking Prior Function Level of Independence: Independent with gait, Independent with transfers  Able to Take Stairs?: Yes Driving: Yes Vocation: Retired Leisure: Hobbies-yes (Comment) Comments: drives, does all the grocery shopping ADL ADL ADL Comments: Please see functional navigator Vision Baseline Vision/History: (wears reading glasses) Wears Glasses: Reading only Patient Visual Report: No change from baseline Perception  Perception: Within Functional Limits Praxis Praxis: Intact Cognition Overall Cognitive Status: History of cognitive impairments - at baseline Arousal/Alertness: Awake/alert Orientation Level: Place;Person;Situation Person: Oriented Place: Oriented Situation: Oriented Year: 2019 Month: August Day of Week: Correct Memory: Impaired Memory Impairment: Decreased short term memory Immediate Memory Recall: Sock;Bed;Blue Memory Recall: Sock;Blue Memory Recall Sock: With Cue Memory Recall Blue: With Cue Attention: Selective Sustained Attention: Appears intact Selective Attention: Impaired Selective Attention Impairment: Verbal basic;Functional basic Awareness: Impaired Awareness Impairment: Emergent impairment Problem Solving: Impaired Problem Solving Impairment: Verbal basic Safety/Judgment: Impaired Sensation Sensation Light Touch: Appears Intact Proprioception: Appears Intact Coordination Gross Motor Movements are Fluid and Coordinated: No Fine Motor Movements are Fluid and Coordinated: No Coordination and Movement Description: decreased accuracy and smoothness with L UE Heel Shin Test: impaired L to R Motor  Motor Motor: Hemiplegia Mobility  Bed Mobility Bed Mobility: Supine to Sit;Sit to Supine Supine to Sit: Moderate Assistance - Patient 50-74% Sit to Supine: Moderate Assistance - Patient 50-74% Transfers Sit to Stand: Maximal  Assistance - Patient 25-49%  Trunk/Postural Assessment  Cervical Assessment Cervical Assessment: Exceptions to WFL(forward head) Thoracic Assessment Thoracic Assessment: Exceptions to WFL(rounded shoulders) Lumbar Assessment Lumbar Assessment: Exceptions to WFL(posterior pelvic tilt) Postural Control Postural Control: Deficits on evaluation Trunk Control: impaired Righting Reactions: impaired  Balance Balance Balance Assessed: Yes Static Sitting Balance Static Sitting - Balance Support: Bilateral upper extremity supported;Feet supported Static Sitting - Level of Assistance: 5: Stand by assistance Dynamic Sitting Balance Dynamic Sitting - Balance Support:  During functional activity Dynamic Sitting - Level of Assistance: 4: Min Insurance risk surveyor Standing - Balance Support: During functional activity Static Standing - Level of Assistance: 3: Mod assist Dynamic Standing Balance Dynamic Standing - Balance Support: During functional activity Dynamic Standing - Level of Assistance: 3: Mod assist Extremity/Trunk Assessment RUE Assessment RUE Assessment: Within Functional Limits LUE Assessment LUE Assessment: Exceptions to [facility] Active Range of Motion (AROM) Comments: Limited by sternal precautions General Strength Comments: 3+/5 overall shoulder/elbow/wrist/hand LUE Body System: Neuro Brunstrum levels for arm and hand: Arm;Hand Brunstrum level for arm: Stage V Relative Independence from Synergy Brunstrum level for hand: Stage VI Isolated joint movements  See Function Navigator for Current Functional Status.   Refer to Care Plan for Long Term Goals  Recommendations for other services: None   Discharge Criteria: Patient will be discharged from OT if patient refuses treatment 3 consecutive times without medical reason, if treatment goals not met, if there is a change in medical status, if patient makes no progress towards goals or if patient is discharged from  hospital.  The above assessment, treatment plan, treatment alternatives and goals were discussed and mutually agreed upon: by patient and by family  Valma Cava 02/04/2018, 2:20 PM

## 2018-02-03 NOTE — Care Management Note (Signed)
Case Management Note Nathan PieriniKristi Skarleth Delmonico RN,BSN Unit [facility] 1-22 Case Manager  [phone]  Patient Details  Name: Nathan PhilipsRandleman D Thibault Jr. MRN: [id number] Date of Birth: 11/05/1945  Subjective/Objective:  Pt admitted s/p MVR, CABG x4 with post op HF management                 Action/Plan: PTA pt lived at home with wife, CIR consulted for possible admission. CSW following for SNF backup  Expected Discharge Date:  02/03/18               Expected Discharge Plan:  IP Rehab Facility  In-House Referral:  Clinical Social Work  Discharge planning Services  CM Consult  Post Acute Care Choice:    Choice offered to:     DME Arranged:    DME Agency:     HH Arranged:    HH Agency:     Status of Service:  Completed, signed off  If discussed at [employer] Meetings, dates discussed:    Discharge Disposition: IP rhab   Additional Comments:  02/03/18- 1230- Nathan PieriniKristi Denetra Formoso RN, CM- notified by Efraim KaufmannMelissa with IP rehab pt has been approved by insurance and they have bed available today.  Spoke with Erin -CVTS -PA who states pt is medically stable to go to CIR today. Have let Melissa know with CIR that pt is clear for CIR  Nathan [name], Nathan Glodowski Hall, RN 02/03/2018, 3:06 PM

## 2018-02-03 NOTE — Progress Notes (Signed)
Physical Medicine and Rehabilitation Consult Reason for Consult: Decreased functional mobility Referring Physician: Dr. Zenaida NieceVan trite   HPI: Gorman D [name] is a 72 y.o. right-handed male with history of atrial fibrillation, diastolic and systolic congestive heart failure, diabetes mellitus, hypertension, CVA November 2018 maintained on aspirin, remote tobacco abuse, CAD/ischemic cardiomyopathy with severe mitral regurgitation identified during work-up of CVA with cardiac catheterization showing severe three-vessel disease.  Per chart review and patient, patient lives with spouse.  Was independent prior to admission as he drives likes to cook and golf.  Wife did note some mild memory deficits prior to admission.  One level home with one-step to entry.  Presented 01/23/2018 after recovery from CVA for evaluation of CAD and anticipation for CABG.  During work-up for CAD noted multiple necrotic teeth with multiple extractions.  Patient cleared for surgery and underwent CABG x4 as well as mitral valve replacement 01/23/2018 per Dr. Zenaida NieceVan trite.  CT head on 8/2 reviewed, ?CVA. Sternal precautions as indicated.  Patient remains on aspirin after CABG as well as the addition of Coumadin.  Subcutaneous Lovenox for DVT prophylaxis.  Acute blood loss anemia 9.1 as well as mild hyponatremia 133.  Physical and occupational therapy evaluations completed and MD has requested physical medicine rehab consult.   Review of Systems  Constitutional: Negative for chills and fever.  HENT: Negative for hearing loss.   Eyes: Negative for blurred vision and double vision.  Gastrointestinal: Positive for constipation. Negative for nausea and vomiting.  Genitourinary: Positive for urgency. Negative for dysuria, flank pain and hematuria.  Skin: Negative for rash.  Neurological: Positive for weakness.  Psychiatric/Behavioral: Positive for memory loss.  All other systems reviewed and are negative.      Past Medical  History:  Diagnosis Date  . Atrial fibrillation with RVR (HCC) 05/01/2017  . Cardiomyopathy (HCC) 05/01/2017  . CHF (congestive heart failure) (HCC)   . 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 Coronary artery disease   . DM (diabetes mellitus) type 2, uncontrolled, with ketoacidosis (HCC) 05/01/2017  . Essential hypertension 11/26/2016  . History of kidney stones   . HLD (hyperlipidemia) 05/01/2017  . IVCD (intraventricular conduction defect) 04/24/2015  . Mitral valve regurgitation   . Mixed dyslipidemia 11/26/2016  . Stroke (cerebrum) (HCC) 04/29/2017        Past Surgical History:  Procedure Laterality Date  . BRAIN SURGERY    . CARDIAC CATHETERIZATION    . CORONARY ARTERY BYPASS GRAFT N/A 01/23/2018   Procedure: CORONARY ARTERY BYPASS GRAFTING (CABG) x 4 WITH ENDOSCOPIC HARVESTING OF RIGHT GREATER SAPHENOUS VEIN: LIMA TO LAD, SVG TO RCA, SVG TO DIAG, SVG TO OM ;  Surgeon: [name], Peter, MD;  [facility];  Service: Open Heart Surgery;  Laterality: N/A;  . IR PERCUTANEOUS ART THROMBECTOMY/INFUSION INTRACRANIAL INC DIAG ANGIO  04/29/2017  . IR RADIOLOGIST EVAL & MGMT  05/17/2017  . IR US GUIDE VASC ACCESS LEFT  04/29/2017  . IR US GUIDE VASC ACCESS RIGHT  04/29/2017  . LEFT ATRIAL APPENDAGE OCCLUSION Left 01/23/2018   Procedure: LEFT ATRIAL APPENDAGE OCCLUSION USING ATRICURE ATRICLIP PRO2 LAA EXCLUSION SYSTEM  SIZE 40, LOT # [id number], CAT # [id number], EXP. DATE 2020-04-28;  Surgeon: Donata ClayVan Trigt, Theron AristaPeter, MD;  [facility];  Service: Open Heart Surgery;  Laterality: Left;  . MITRAL VALVE REPLACEMENT N/A 01/23/2018   Procedure: MITRAL VALVE (MV) REPLACEMENT USING MAGNA MITRAL EASE PERICARDIAL BIOPROSTHESIS, MODEL 7300TFX, SIZE 29 MM, SERIAL # [id number], EXPIRATION  DATE 2021-03-03.;  Surgeon: Donata Clay, Theron Arista, MD;  Location: [facility] OR;  Service: Open Heart Surgery;  Laterality: N/A;  . MULTIPLE EXTRACTIONS WITH ALVEOLOPLASTY N/A 12/26/2017   Procedure:  Extraction of tooth #'s 4,6-11, 18 -27, 30, and 31 with alveoloplasty;  Surgeon: [name], DDS;  Location: [facility] OR;  Service: Oral Surgery;  Laterality: N/A;  . RADIOLOGY WITH ANESTHESIA N/A 04/29/2017   Procedure: IR WITH ANESTHESIA;  Surgeon: Radiologist, Medication, MD;  Location: [facility] OR;  Service: Radiology;  Laterality: N/A;  . RIGHT/LEFT HEART CATH AND CORONARY ANGIOGRAPHY N/A 08/18/2017   Procedure: RIGHT/LEFT HEART CATH AND CORONARY ANGIOGRAPHY;  Surgeon: [name], MD;  Location: [facility] INVASIVE CV LAB;  Service: Cardiovascular;  Laterality: N/A;  . TEE WITHOUT CARDIOVERSION N/A 09/22/2017   Procedure: TRANSESOPHAGEAL ECHOCARDIOGRAM (TEE);  Surgeon: [name], MD;  Location: [facility] ENDOSCOPY;  Service: Cardiovascular;  Laterality: N/A;  . TEE WITHOUT CARDIOVERSION N/A 01/23/2018   Procedure: TRANSESOPHAGEAL ECHOCARDIOGRAM (TEE);  Surgeon: Donata Clay, Theron Arista, MD;  Location: [facility] OR;  Service: Open Heart Surgery;  Laterality: N/A;  . TONSILLECTOMY          Family History  Problem Relation Age of Onset  . Hypertension Father   . Diabetes Father    Social History:  reports that he quit smoking about 25 years ago. He has never used smokeless tobacco. He reports that he drinks about 1.8 oz of alcohol per week. He reports that he does not use drugs. Allergies:       Allergies  Allergen Reactions  . Fentanyl Shortness Of Breath  . Promethazine Hcl Anaphylaxis and Other (See Comments)    Cardiac arrest  . Foeniculum Vulgare Other (See Comments)    Fennel bulbs--nausea only  . Metformin And Related Diarrhea  . Penicillins Nausea Only    Has patient had a PCN reaction causing immediate rash, facial/tongue/throat swelling, SOB or lightheadedness with hypotension: no Has patient had a PCN reaction causing severe rash involving mucus membranes or skin necrosis: no Has patient had a PCN reaction that required hospitalization: no Has patient had a PCN reaction occurring  within the last 10 years: yes If all of the above answers are "NO", then may proceed with Cephalosporin use.   . Sulfa Antibiotics Other (See Comments)    G.I. Upset         Medications Prior to Admission  Medication Sig Dispense Refill  . amiodarone (PACERONE) 200 MG tablet Take 1 tablet (200 mg total) by mouth daily. 90 tablet 3  . aspirin EC 81 MG tablet Take 81 mg by mouth daily.    . carvedilol (COREG) 6.25 MG tablet Take 1 tablet (6.25 mg total) by mouth 2 (two) times daily. 60 tablet 3  . glipiZIDE (GLUCOTROL XL) 10 MG 24 hr tablet Take 10 mg by mouth daily with breakfast.   3  . lisinopril (PRINIVIL,ZESTRIL) 5 MG tablet Take 2.5 mg by mouth daily.    . repaglinide (PRANDIN) 0.5 MG tablet Take 1 tablet by mouth 3 (three) times daily with meals.   3  . simvastatin (ZOCOR) 40 MG tablet Take 0.5 tablets (20 mg total) by mouth at bedtime. 45 tablet 1  . tamsulosin (FLOMAX) 0.4 MG CAPS capsule Take 0.4 mg by mouth at bedtime.   3  . acetaminophen (TYLENOL) 325 MG tablet Take 650 mg by mouth every 6 (six) hours as needed (for pain.).     [name] cyclobenzaprine (FLEXERIL) 10 MG tablet Take 10 mg by mouth  2 (two) times daily as needed.  0    Home: Home Living Family/patient expects to be discharged to:: Private residence Living Arrangements: Spouse/significant other Available Help at Discharge: Family, Available 24 hours/day Type of Home: House Home Access: Level entry Home Layout: One level Bathroom Shower/Tub: Engineer, manufacturing systems: Standard Home Equipment: None  Functional History: Prior Function Level of Independence: Independent Comments: drives, likes to cook and golf, wife states pt was having memory deficits prior to admission Functional Status:  Mobility: Bed Mobility Overal bed mobility: Needs Assistance Bed Mobility: Rolling, Sidelying to Sit Rolling: Mod assist Sidelying to sit: Max assist, HOB elevated General bed mobility comments: In  recliner upon arrival Transfers Overall transfer level: Needs assistance Equipment used: Rolling walker (2 wheeled) Transfers: Sit to/from Stand Sit to Stand: Mod assist, +2 safety/equipment Stand pivot transfers: Max assist, +2 physical assistance General transfer comment: mod assist x 2 to stand from chair x 3 trials with left knee blocked, cues for hands on knees and assist of belt with increased time to rise and stabilize Ambulation/Gait Ambulation/Gait assistance: Mod assist, +2 safety/equipment Gait Distance (Feet): 5 Feet Assistive device: Rolling walker (2 wheeled) Gait Pattern/deviations: Step-to pattern, Decreased stride length, Trunk flexed, Narrow base of support General Gait Details: pt with initial left lean in standing with cues and assist for midline posture with assist to control RW and to advance and place LLE to maintain wider BOB and improve position to midline. Pt walked 2', 3', 5' with close chair follow and seated rest after each trial. Pt limited by fatigue despite stable vital signs and max encouragement Gait velocity interpretation: <1.31 ft/sec, indicative of household ambulator  ADL: ADL Overall ADL's : Needs assistance/impaired Eating/Feeding: Independent, Sitting Grooming: Set up, Supervision/safety, Sitting Upper Body Bathing: Minimal assistance, Sitting Lower Body Bathing: Maximal assistance, Sit to/from stand Lower Body Bathing Details (indicate cue type and reason): Max A for sit<>Stand.  Upper Body Dressing : Minimal assistance, Sitting Lower Body Dressing: Maximal assistance, Sit to/from stand Lower Body Dressing Details (indicate cue type and reason): Pt able to bring his ankles to his knees for donning socks. Requiring Min A to facilitate movement towards knees and then adjust socks position. Max A for sit<>stand Toilet Transfer: Maximal assistance, RW(Simulated in room. Sit<>stand only) Functional mobility during ADLs: Maximal assistance, +2 for  safety/equipment, Rolling walker, +2 for physical assistance  Cognition: Cognition Overall Cognitive Status: Impaired/Different from baseline Orientation Level: Oriented X4 Cognition Arousal/Alertness: Awake/alert Behavior During Therapy: Flat affect Overall Cognitive Status: Impaired/Different from baseline Area of Impairment: Memory, Following commands, Safety/judgement Orientation Level: Disoriented to, Time Memory: Decreased recall of precautions Following Commands: Follows one step commands inconsistently, Follows one step commands with increased time Safety/Judgement: Decreased awareness of deficits, Decreased awareness of safety General Comments: pt with tendency for eyes closed at rest, pt unable to recall sternal precautions other than keeping arms in  Blood pressure (!) 84/64, pulse (!) 50, temperature 97.7 F (36.5 C), temperature source Axillary, resp. rate 15, height 5' 11.5" (1.816 m), weight 91.4 kg (201 lb 8 oz), SpO2 98 %. Physical Exam  Vitals reviewed. Constitutional: He appears well-developed and well-nourished.  HENT:  Head: Normocephalic and atraumatic.  Eyes: EOM are normal. Right eye exhibits no discharge. Left eye exhibits no discharge.  Neck: Normal range of motion. Neck supple. No thyromegaly present.  Cardiovascular:  Irregularly irregular  Respiratory: Effort normal and breath sounds normal. No respiratory distress.  GI: Soft. Bowel sounds are normal. He exhibits no  distension.  Musculoskeletal:  No edema or tenderness in extremities  Neurological:  Alert and oriented 3 Falls asleep during exam Follows simple commands.   Fair medical historian Motor: 4 -/5 grossly throughout (? Participation)  Skin:  Midline chest incision clean and dry  Psychiatric: His affect is blunt. His speech is delayed. He is slowed.   Assessment/Plan: Diagnosis: debility Labs and images (see above) independently reviewed.  Records reviewed and summated  above.  1. Does the need for close, 24 hr/day medical supervision in concert with the patient's rehab needs make it unreasonable for this patient to be served in a less intensive setting? Potentially  2. Co-Morbidities requiring supervision/potential complications: atrial fibrillation (Continue meds, monitor heart rate with increased physical activity), diastolic and systolic CHF (Monitor in accordance with increased physical activity and avoid UE resistance excercises), diabetes mellitus (Monitor in accordance with exercise and adjust meds as necessary), HTN (monitor and provide prns in accordance with increased physical exertion and pain), CVA November 2018 maintained on aspirin, remote tobacco abuse, Acute blood loss anemia (transfuse if necessary to ensure appropriate perfusion for increased activity tolerance), hyponatremia (continue to monitor, treated necessary) 3. Due to safety, skin/wound care, pain management and patient education, does the patient require 24 hr/day rehab nursing? Yes 4. Does the patient require coordinated care of a physician, rehab nurse, PT (1-2 hrs/day, 5 days/week) and OT (1-2 hrs/day, 5 days/week) to address physical and functional deficits in the context of the above medical diagnosis(es)? Potentially Addressing deficits in the following areas: balance, endurance, locomotion, strength, transferring, bathing, dressing, toileting and psychosocial support 5. Can the patient actively participate in an intensive therapy program of at least 3 hrs of therapy per day at least 5 days per week? Yes 6. The potential for patient to make measurable gains while on inpatient rehab is good 7. Anticipated functional outcomes upon discharge from inpatient rehab are supervision and min assist  with PT, supervision and min assist with OT, n/a with SLP. 8. Estimated rehab length of stay to reach the above functional goals is: 10-14 days. 9. Anticipated D/C setting: TBD 10. Anticipated post  D/C treatments: HH therapy and Home excercise program 11. Overall Rehab/Functional Prognosis: good  RECOMMENDATIONS: This patient's condition is appropriate for continued rehabilitative care in the following setting: Patient states he would like to discuss discharge options with his wife. He will likely need some level of assistance/supervision at discharge. Unclear if this is available at this time. If deficits are worse from baseline consider MRI per last CT report. Patient has agreed to participate in recommended program. Potentially Note that insurance prior authorization may be required for reimbursement for recommended care.  Comment: Rehab Admissions Coordinator to follow up.   I have personally performed a face to face diagnostic evaluation, including, but not limited to relevant history and physical exam findings, of this patient and developed relevant assessment and plan.  Additionally, I have reviewed and concur with the physician assistant's documentation above.   Maryla Morrow, MD, ABPMR Mcarthur Rossetti Angiulli, PA-C 01/31/2018        Revision History                        Routing History

## 2018-02-03 NOTE — Progress Notes (Signed)
Discussed IS, sternal precautions, diet, and CRPII with pt. Wife not present. Noted he can be forgetful. RN to give diets to wife. Will refer to [place] CRPII by interest letter.  [phone] Ethelda ChickKristan Surya Folden CES, ACSM 3:00 PM 02/03/2018

## 2018-02-03 NOTE — Progress Notes (Signed)
[facility] DAILY ICU PROGRESS NOTE                   [address]            [place] [address]          [phone]   11 Days Post-Op Procedure(s) (LRB): MITRAL VALVE (MV) REPLACEMENT USING MAGNA MITRAL EASE PERICARDIAL BIOPROSTHESIS, MODEL 7300TFX, SIZE 29 MM, SERIAL # [id number], EXPIRATION  DATE 2021-03-03. (N/A) CORONARY ARTERY BYPASS GRAFTING (CABG) x 4 WITH ENDOSCOPIC HARVESTING OF RIGHT GREATER SAPHENOUS VEIN: LIMA TO LAD, SVG TO RCA, SVG TO DIAG, SVG TO OM  (N/A) TRANSESOPHAGEAL ECHOCARDIOGRAM (TEE) (N/A) LEFT ATRIAL APPENDAGE OCCLUSION USING ATRICURE ATRICLIP PRO2 LAA EXCLUSION SYSTEM  SIZE 40, LOT # [id number], CAT # [id number], EXP. DATE 2020-04-28 (Left)  Total Length of Stay:  LOS: 11 days   Subjective:  Patient is just feeling poorly.  He developed urinary retention and hasn't been able to void, requiring I/O cath.  Patient states he has no urge to void.  He has not moved his bowels in a few days.  Objective: Vital signs in last 24 hours: Temp:  [97.6 F (36.4 C)-98.4 F (36.9 C)] 98.1 F (36.7 C) (08/09 0404) Pulse Rate:  [55-75] 55 (08/09 0333) Cardiac Rhythm: Normal sinus rhythm (08/09 0000) Resp:  [13-27] 15 (08/09 0333) BP: (81-123)/(47-109) 112/62 (08/09 0333) SpO2:  [93 %-99 %] 98 % (08/09 0333) Weight:  [88.5 kg] 88.5 kg (08/09 0500)  Filed Weights   02/01/18 0500 02/02/18 0600 02/03/18 0500  Weight: 88.3 kg 89.1 kg 88.5 kg    Weight change: -0.6 kg   Hemodynamic parameters for last 24 hours: CVP:  [8 mmHg-9 mmHg] 9 mmHg  Intake/Output from previous day: 08/08 0701 - 08/09 0700 In: 1140 [P.O.:1140] Out: 3600 [Urine:3600]  Current Meds: Scheduled Meds: . aspirin EC  81 mg Oral Daily   Or  . aspirin  81 mg Oral Daily  . bisacodyl  10 mg Oral Daily   Or  . bisacodyl  10 mg Rectal Daily  . Chlorhexidine Gluconate Cloth  6 each Topical Daily  . guaiFENesin  1,200 mg Oral BID  . insulin aspart  0-24 Units Subcutaneous TID WC  . insulin  detemir  15 Units Subcutaneous Daily  . lactulose  20 g Oral Once  . losartan  12.5 mg Oral QHS  . mouth rinse  15 mL Mouth Rinse BID  . pantoprazole  40 mg Oral Daily  . rosuvastatin  20 mg Oral q1800  . sodium chloride flush  10-40 mL Intracatheter Q12H  . spironolactone  12.5 mg Oral Daily  . tamsulosin  0.4 mg Oral QHS  . warfarin  5 mg Oral q1800  . warfarin   Does not apply Once  . Warfarin - Physician Dosing Inpatient   Does not apply q1800   Continuous Infusions: PRN Meds:.docusate sodium, ondansetron (ZOFRAN) IV, oxyCODONE, pneumococcal 23 valent vaccine, sodium chloride flush, traMADol  General appearance: alert, cooperative and no distress Heart: regular rate and rhythm Lungs: clear to auscultation bilaterally Abdomen: soft, non-tender; bowel sounds normal; no masses,  no organomegaly Extremities: edema trace Wound: clean and dry  Lab Results: CBC: Recent Labs    02/01/18 0416  WBC 9.6  HGB 8.9*  HCT 27.7*  PLT 181   BMET:  Recent Labs    02/01/18 0416 02/02/18 0420  NA 132* 133*  K 3.7 4.2  CL 91* 97*  CO2 31 27  GLUCOSE  148* 122*  BUN 26* 19  CREATININE 0.88 0.84  CALCIUM 8.3* 8.4*    CMET: Lab Results  Component Value Date   WBC 9.6 02/01/2018   HGB 8.9 (L) 02/01/2018   HCT 27.7 (L) 02/01/2018   PLT 181 02/01/2018   GLUCOSE 122 (H) 02/02/2018   CHOL 136 07/28/2017   TRIG 101 07/28/2017   HDL 34 (L) 07/28/2017   LDLCALC 82 07/28/2017   ALT 17 01/31/2018   AST 24 01/31/2018   NA 133 (L) 02/02/2018   K 4.2 02/02/2018   CL 97 (L) 02/02/2018   CREATININE 0.84 02/02/2018   BUN 19 02/02/2018   CO2 27 02/02/2018   TSH 2.140 07/28/2017   INR 2.60 02/03/2018   HGBA1C 5.9 (H) 01/19/2018      PT/INR:  Recent Labs    02/03/18 0531  LABPROT 27.6*  INR 2.60   Radiology: No results found.   Assessment/Plan: S/P Procedure(s) (LRB): MITRAL VALVE (MV) REPLACEMENT USING MAGNA MITRAL EASE PERICARDIAL BIOPROSTHESIS, MODEL 7300TFX, SIZE 29  MM, SERIAL # [id number], EXPIRATION  DATE 2021-03-03. (N/A) CORONARY ARTERY BYPASS GRAFTING (CABG) x 4 WITH ENDOSCOPIC HARVESTING OF RIGHT GREATER SAPHENOUS VEIN: LIMA TO LAD, SVG TO RCA, SVG TO DIAG, SVG TO OM  (N/A) TRANSESOPHAGEAL ECHOCARDIOGRAM (TEE) (N/A) LEFT ATRIAL APPENDAGE OCCLUSION USING ATRICURE ATRICLIP PRO2 LAA EXCLUSION SYSTEM  SIZE 40, LOT # [id number], CAT # [id number], EXP. DATE 2020-04-28 (Left)  1. CV- back in rate controlled A. Fib-on Cozaar per AHF 2. Pulm- no acute issues, continue IS 3. INR 2.60, continue coumadin at 5 mg daily 4. Renal- creatinine has been WNL, on Spironolactone, K is WNL 5. GU- sudden onset urinary retention, on home Flomax for BPH, has been I/O cathed twice, he will need foley placed, will send urine for UA/Culture to ensure no infection present 6. GI- constipation, hasnt moved bowels in a few days, will give Lactulose today 7. DM- sugars remain well controlled 8. Deconditioning- awaiting CIR placement 9. Dispo- patient with new onset urinary retention, history of BPH, will likely need foley replaced, treat constipation with Lactulose which could also be contributing to difficulty in voiding urine, labs pending, continue PT/OT    Nathan [name] 02/03/2018 7:54 AM

## 2018-02-03 NOTE — Progress Notes (Signed)
[facility] Alert and oriented X 4. Patient oriented to unit and room. No personal belongings with patient. Patient c/o pain in surgical area. Stage two pressure wound on sacral- cleaned, barrier cream applied and dressed.  Side rails up X 3. Call bell with in reach. Will continue to monitor  Lorri FrederickMartha E Hanner, LPN

## 2018-02-03 NOTE — Progress Notes (Signed)
Physical Therapy Treatment Patient Details Name: Nathan [name]. MRN: [id number] DOB: [birth year] Today's Date: 02/03/2018    History of Present Illness 72 yo admitted 7/29 for CABGx4 and MVR. PMHx: DM, CAD, CHF, ICM, AFib, CVA, HTN    PT Comments    Arrived to patient on bedpan, upset that he was unable to have BM. Agreed to move with physical therapy to help move bowels. Able to progress to 15 feet ambulation with min a and chair follow, with pt demonstrating self-limiting behaviors. Pt encouraged to continue HEP and mobility to increase endurance and functional mobility.    Follow Up Recommendations  CIR;Supervision/Assistance - 24 hour     Equipment Recommendations  Rolling walker with 5" wheels;3in1 (PT)    Recommendations for Other Services       Precautions / Restrictions Precautions Precautions: Fall;Sternal Precaution Comments: left weak Restrictions Weight Bearing Restrictions: Yes(sternal precaution)    Mobility  Bed Mobility Overal bed mobility: Needs Assistance Bed Mobility: Supine to Sit Rolling: Min assist Sidelying to sit: Mod assist;HOB elevated       General bed mobility comments: mod a for trunk control, and bil LE over EOB   Transfers Overall transfer level: Needs assistance Equipment used: Rolling walker (2 wheeled) Transfers: Sit to/from Stand Sit to Stand: Min assist(from commode) Stand pivot transfers: Mod assist;+2 safety/equipment;+2 physical assistance(from bed to commode)       General transfer comment: min a sit to stand from bed and min guard to stand from bedside commode, progressing from last visit. Some report of dizziness and nausea, orthostatic vitals taken, see clinical impression.   Ambulation/Gait Ambulation/Gait assistance: Min assist;+2 safety/equipment Gait Distance (Feet): 16 Feet Assistive device: Rolling walker (2 wheeled) Gait Pattern/deviations: Step-to pattern   Gait velocity interpretation: <1.31 ft/sec,  indicative of household ambulator General Gait Details: pt assisted with L foot advancement x2 with ambulation, verbal cues for safety with chair follow.    Stairs             Wheelchair Mobility    Modified Rankin (Stroke Patients Only)       Balance Overall balance assessment: Needs assistance Sitting-balance support: Feet supported;No upper extremity supported Sitting balance-Leahy Scale: Fair Sitting balance - Comments: min guard for safety at EOB Postural control: Left lateral lean Standing balance support: Bilateral upper extremity supported;During functional activity Standing balance-Leahy Scale: Poor Standing balance comment: Reliant on UE support, forward postural lean                             Cognition Arousal/Alertness: Awake/alert Behavior During Therapy: WFL for tasks assessed/performed Overall Cognitive Status: No family/caregiver present to determine baseline cognitive functioning Area of Impairment: Memory;Following commands;Safety/judgement                 Orientation Level: Disoriented to;Time   Memory: Decreased short-term memory;Decreased recall of precautions Following Commands: Follows one step commands inconsistently;Follows one step commands with increased time(multiple verbal and tactile cues to maintain sternal precautions ) Safety/Judgement: Decreased awareness of safety;Decreased awareness of deficits     General Comments: able to recall keep in the tube and no pushing precautions verbally, but required frequent verbal and tactile cues throughout treatment to adhere to precautions       Exercises General Exercises - Lower Extremity Long Arc Quad: AROM;15 reps;Both;Seated Hip Flexion/Marching: AAROM;15 reps;Seated;Both    General Comments        Pertinent Vitals/Pain Pain Location: chest, back,  stomach  Pain Descriptors / Indicators: Sore;Discomfort;Grimacing;Constant Pain Intervention(s): Limited activity  within patient's tolerance;Monitored during session;Repositioned    Home Living                      Prior Function            PT Goals (current goals can now be found in the care plan section) Progress towards PT goals: Progressing toward goals    Frequency           PT Plan Current plan remains appropriate    Co-evaluation              AM-PAC PT "6 Clicks" Daily Activity  Outcome Measure  Difficulty turning over in bed (including adjusting bedclothes, sheets and blankets)?: Unable Difficulty moving from lying on back to sitting on the side of the bed? : Unable Difficulty sitting down on and standing up from a chair with arms (e.g., wheelchair, bedside commode, etc,.)?: Unable Help needed moving to and from a bed to chair (including a wheelchair)?: A Little Help needed walking in hospital room?: A Lot Help needed climbing 3-5 steps with a railing? : Total 6 Click Score: 9    End of Session Equipment Utilized During Treatment: Gait belt Activity Tolerance: Patient tolerated treatment well Patient left: in chair;with call bell/phone within reach;with chair alarm set Nurse Communication: Mobility status;Precautions PT Visit Diagnosis: Other abnormalities of gait and mobility (R26.89);Muscle weakness (generalized) (M62.81);Unsteadiness on feet (R26.81);Difficulty in walking, not elsewhere classified (R26.2)     Time: [id number] PT Time Calculation (min) (ACUTE ONLY): 29 min  Charges:  $Gait Training: 8-22 mins $Therapeutic Exercise: 8-22 mins                     Carma LairKara Abou Sterkel, [place]  Acute Rehab [phone]    Carma LairKara Marylynne Keelin 02/03/2018, 2:40 PM

## 2018-02-03 NOTE — H&P (Signed)
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 Prescott Gum. Post op course significant for ABLA, thrombocytopenia,  fluid overload, SOB, hypotension as well as development of MS changed with recurrent left sided weakness on 08/02. CT head done revealing interval development of hypoattenuation in right frontal region felt to be extension of prior R-MCA stroke.  On IV heparin to coumadin and continues to have intermittent A fib. Cardiology feels that patient may require cardioversion in the distant future and coreg added today. He has had bouts of confusion and was found to be retaining urine with I/O cath for [phone] cc in the past 24 hours. He continues to have poor po intake and constipation--no BM since admission. Therapy ongoing and patient noted to be debilitated. CIR recommended due to functional deficits.     Review of Systems  Constitutional: Positive for malaise/fatigue.  HENT: Negative for hearing loss and tinnitus.   Eyes: Negative for blurred vision and double vision.  Respiratory: Negative for sputum production and shortness of breath.   Cardiovascular: Positive for leg swelling. Negative for chest pain and palpitations.  Gastrointestinal: Positive for abdominal pain and constipation ("needs someone to get it out"). Negative for heartburn and nausea.  Musculoskeletal: Negative for back pain, joint pain, myalgias and neck pain.  Neurological: Positive for focal weakness (left sided weakness getting back to baseline) and weakness.  Psychiatric/Behavioral: Negative for depression. The patient does not have  insomnia.         Past Medical History:  Diagnosis Date  . Atrial fibrillation with RVR ([place]) 05/01/2017  . Cardiomyopathy ([place]) 05/01/2017  . CHF (congestive heart failure) ([place])   . Chronic combined systolic and diastolic heart failure ([place]) 04/24/2015  . Coronary artery disease   . DM (diabetes mellitus) type 2, uncontrolled, with ketoacidosis ([place]) 05/01/2017  . Essential hypertension 11/26/2016  . History of kidney stones   . HLD (hyperlipidemia) 05/01/2017  . IVCD (intraventricular conduction defect) 04/24/2015  . Mitral valve regurgitation   . Mixed dyslipidemia 11/26/2016  . Stroke (cerebrum) ([place]) 04/29/2017         Past Surgical History:  Procedure Laterality Date  . BRAIN SURGERY    . CARDIAC CATHETERIZATION    . CORONARY ARTERY BYPASS GRAFT N/A 01/23/2018   Procedure: CORONARY ARTERY BYPASS GRAFTING (CABG) x 4 WITH ENDOSCOPIC HARVESTING OF RIGHT GREATER SAPHENOUS VEIN: LIMA TO LAD, SVG TO RCA, SVG TO DIAG, SVG TO OM ;  Surgeon: Ivin Poot, MD;  Location: [facility];  Service: Open Heart Surgery;  Laterality: N/A;  . IR PERCUTANEOUS ART THROMBECTOMY/INFUSION INTRACRANIAL INC DIAG ANGIO  04/29/2017  . IR RADIOLOGIST EVAL & MGMT  05/17/2017  . IR US GUIDE VASC ACCESS LEFT  04/29/2017  . IR US GUIDE VASC ACCESS RIGHT  04/29/2017  . LEFT ATRIAL APPENDAGE OCCLUSION Left 01/23/2018   Procedure: LEFT ATRIAL APPENDAGE OCCLUSION USING ATRICURE ATRICLIP PRO2 LAA EXCLUSION SYSTEM  SIZE 40, LOT # [id number], CAT # [id number], EXP. DATE 2020-04-28;  Surgeon: Prescott Gum, Collier Salina, MD;  Location: [facility];  Service: Open Heart Surgery;  Laterality: Left;  . MITRAL VALVE  REPLACEMENT N/A 01/23/2018   Procedure: MITRAL VALVE (MV) REPLACEMENT USING MAGNA MITRAL EASE PERICARDIAL BIOPROSTHESIS, MODEL [id number], SIZE 29 MM, SERIAL # [id number], EXPIRATION  DATE 2021-03-03.;  Surgeon: Ivin Poot, MD;  Location: [facility];  Service: Open Heart Surgery;  Laterality: N/A;  . MULTIPLE EXTRACTIONS WITH  ALVEOLOPLASTY N/A 12/26/2017   Procedure: Extraction of tooth #'s 4,6-11, 18 -27, 30, and 31 with alveoloplasty;  Surgeon: Lenn Cal, DDS;  Location: [facility];  Service: Oral Surgery;  Laterality: N/A;  . RADIOLOGY WITH ANESTHESIA N/A 04/29/2017   Procedure: IR WITH ANESTHESIA;  Surgeon: Radiologist, Medication, MD;  Location: [facility];  Service: Radiology;  Laterality: N/A;  . RIGHT/LEFT HEART CATH AND CORONARY ANGIOGRAPHY N/A 08/18/2017   Procedure: RIGHT/LEFT HEART CATH AND CORONARY ANGIOGRAPHY;  Surgeon: Nelva Bush, MD;  Location: [facility];  Service: Cardiovascular;  Laterality: N/A;  . TEE WITHOUT CARDIOVERSION N/A 09/22/2017   Procedure: TRANSESOPHAGEAL ECHOCARDIOGRAM (TEE);  Surgeon: Lelon Perla, MD;  Location: [facility] ENDOSCOPY;  Service: Cardiovascular;  Laterality: N/A;  . TEE WITHOUT CARDIOVERSION N/A 01/23/2018   Procedure: TRANSESOPHAGEAL ECHOCARDIOGRAM (TEE);  Surgeon: Prescott Gum, Collier Salina, MD;  Location: [facility];  Service: Open Heart Surgery;  Laterality: N/A;  . TONSILLECTOMY           Family History  Problem Relation Age of Onset  . Hypertension Father   . Diabetes Father     Social History:  Married. Independent but limited PTA. He reports that he quit smoking about 25 years ago. He has never used smokeless tobacco. He reports that he drinks about 3.0 standard drinks of alcohol per week. He reports that he does not use drugs.        Allergies  Allergen Reactions  . Fentanyl Shortness Of Breath  . Promethazine Hcl Anaphylaxis and Other (See Comments)    Cardiac arrest  . Foeniculum Vulgare Other (See Comments)    Fennel bulbs--nausea only  . Metformin And Related Diarrhea  . Penicillins Nausea Only    Has patient had a PCN reaction causing immediate rash, facial/tongue/throat swelling, SOB or lightheadedness with hypotension: no Has patient had a PCN reaction causing severe rash involving mucus membranes or skin necrosis: no Has patient  had a PCN reaction that required hospitalization: no Has patient had a PCN reaction occurring within the last 10 years: yes If all of the above answers are "NO", then may proceed with Cephalosporin use.   . Sulfa Antibiotics Other (See Comments)    G.I. Upset          Medications Prior to Admission  Medication Sig Dispense Refill  . amiodarone (PACERONE) 200 MG tablet Take 1 tablet (200 mg total) by mouth daily. 90 tablet 3  . aspirin EC 81 MG tablet Take 81 mg by mouth daily.    . carvedilol (COREG) 6.25 MG tablet Take 1 tablet (6.25 mg total) by mouth 2 (two) times daily. 60 tablet 3  . glipiZIDE (GLUCOTROL XL) 10 MG 24 hr tablet Take 10 mg by mouth daily with breakfast.   3  . lisinopril (PRINIVIL,ZESTRIL) 5 MG tablet Take 2.5 mg by mouth daily.    . repaglinide (PRANDIN) 0.5 MG tablet Take 1 tablet by mouth 3 (three) times daily with meals.   3  . simvastatin (ZOCOR) 40 MG tablet Take 0.5 tablets (20 mg total) by mouth at bedtime. 45 tablet 1  . tamsulosin (FLOMAX) 0.4 MG CAPS capsule Take 0.4 mg by mouth at bedtime.  3  . acetaminophen (TYLENOL) 325 MG tablet Take 650 mg by mouth every 6 (six) hours as needed (for pain.).     [name] cyclobenzaprine (FLEXERIL) 10 MG tablet Take 10 mg by mouth 2 (two) times daily as needed.  0    Drug Regimen Review  Drug regimen was reviewed and remains appropriate with no significant issues identified  Home: Home Living Family/patient expects to be discharged to:: Private residence Living Arrangements: Spouse/significant other Available Help at Discharge: Family, Available 24 hours/day Type of Home: House Home Access: Level entry Home Layout: One level Bathroom Shower/Tub: Chiropodist: Standard Home Equipment: None   Functional History: Prior Function Level of Independence: Independent Comments: drives, likes to cook and golf, wife states pt was having memory deficits prior to admission  Functional  Status:  Mobility: Bed Mobility Overal bed mobility: Needs Assistance Bed Mobility: Supine to Sit Rolling: Min assist Sidelying to sit: Max assist, HOB elevated General bed mobility comments: Min a for LLE over EOB Transfers Overall transfer level: Needs assistance Equipment used: Rolling walker (2 wheeled) Transfers: Sit to/from Stand Sit to Stand: Min assist Stand pivot transfers: Max assist, +2 physical assistance General transfer comment: Min A to stand, progressing from last visit, however became dizzy and coudlnt tolerate BP, needed to sit back down. second trial, pt still dizzy.  Ambulation/Gait Ambulation/Gait assistance: Mod assist, +2 safety/equipment Gait Distance (Feet): 5 Feet Assistive device: Rolling walker (2 wheeled) Gait Pattern/deviations: Step-to pattern, Decreased stride length, Trunk flexed, Narrow base of support General Gait Details: defered 2/2 orthostatic hypotension  Gait velocity interpretation: <1.31 ft/sec, indicative of household ambulator  ADL: ADL Overall ADL's : Needs assistance/impaired Eating/Feeding: Independent, Sitting Grooming: Set up, Supervision/safety, Sitting Upper Body Bathing: Minimal assistance, Sitting Lower Body Bathing: Maximal assistance, Sit to/from stand Lower Body Bathing Details (indicate cue type and reason): Max A for sit<>Stand.  Upper Body Dressing : Minimal assistance, Sitting Lower Body Dressing: Minimal assistance, Sit to/from stand, Moderate assistance Lower Body Dressing Details (indicate cue type and reason): Mod A for sit<>stand. Min A for donning/doffing socks while seated at recliner. Cues for adherance to sternal precautions Toilet Transfer: Moderate assistance, +2 for safety/equipment, RW(Simulated to recliner) Toilet Transfer Details (indicate cue type and reason): Pt requiring Mod A for power up into standing and then to maintain standing balance with mobility.  Functional mobility during ADLs: Moderate  assistance, Rolling walker, Cueing for safety, Cueing for sequencing General ADL Comments: Pt performing LB dressing and functional mobility with Mod A for standing balance. Pt requiirng Max A to facilitate left LE during mobility as he fatigued.   Cognition: Cognition Overall Cognitive Status: History of cognitive impairments - at baseline Orientation Level: Oriented X4 Cognition Arousal/Alertness: Awake/alert Behavior During Therapy: WFL for tasks assessed/performed Overall Cognitive Status: History of cognitive impairments - at baseline Area of Impairment: Memory, Following commands, Safety/judgement Orientation Level: Disoriented to, Time Memory: Decreased short-term memory, Decreased recall of precautions Following Commands: Follows one step commands with increased time, Follows one step commands inconsistently Safety/Judgement: Decreased awareness of safety, Decreased awareness of deficits General Comments: Pt more alert this session. Requiring increased cues and time thorughout session for ADLs and funcitonal mobility. During LB dressing, pt requiring Max cues to initate donning of sock.   Blood pressure 108/60, pulse (!) 54, temperature (!) 97.3 F (36.3 C), temperature source Oral, resp. rate 18, height 5' 11.5" (1.816 m), weight 88.5 kg, SpO2 99 %. Physical Exam  Nursing note and vitals reviewed.  Constitutional: He is oriented to person, place, and time. He appears well-developed and well-nourished. No distress.     HENT:  Head: Normocephalic and atraumatic.  Eyes: Pupils are equal, round, and reactive to light. EOM are normal. No scleral icterus.  Neck: Normal range of motion. No JVD present. No tracheal deviation present. No thyromegaly present.  Cardiovascular: Normal rate. An irregular rhythm present. Exam reveals no friction rub.  No murmur heard. IRR IRR  Respiratory: Effort normal. No respiratory distress. He has no wheezes. He exhibits tenderness.  GI: Soft. He  exhibits no distension. There is no tenderness. There is no rebound.  Musculoskeletal: Edema: 1+ edema with resolving ecchymosis RLE.  Edema and ecchymoses RLE  Neurological: He is alert and oriented to person, place, and time. No cranial nerve deficit. Coordination normal.  Speech clear. Oriented to date and has reasonable awareness of deficits. He is able to follow basic commands without difficulty. LUE 4 to 4+/5. LLE 4/5. RUE 4+/5. RLE 2-3/5 with limitations due to pain. Sensory exam grossly normal.   Skin: He is not diaphoretic.  RLE graft donor sites intact with staples. Bruising noted around leg/thigh. Chest incision CDI  Psychiatric: He has a normal mood and affect. His behavior is normal.  Perhaps a little delayed cognitively    LabResultsLast48Hours        Results for orders placed or performed during the hospital encounter of 01/23/18 (from the past 48 hour(s))  Glucose, capillary     Status: Abnormal   Collection Time: 02/01/18  4:21 PM  Result Value Ref Range   Glucose-Capillary 168 (H) 70 - 99 mg/dL   Comment 1 Capillary Specimen   Glucose, capillary     Status: Abnormal   Collection Time: 02/01/18 10:17 PM  Result Value Ref Range   Glucose-Capillary 119 (H) 70 - 99 mg/dL   Comment 1 Notify RN   Protime-INR     Status: Abnormal   Collection Time: 02/02/18  4:20 AM  Result Value Ref Range   Prothrombin Time 22.9 (H) 11.4 - 15.2 seconds   INR 2.04     Comment: Performed at [facility] [address], [place], [place] [address]  Basic metabolic panel     Status: Abnormal   Collection Time: 02/02/18  4:20 AM  Result Value Ref Range   Sodium 133 (L) 135 - 145 mmol/L   Potassium 4.2 3.5 - 5.1 mmol/L   Chloride 97 (L) 98 - 111 mmol/L   CO2 27 22 - 32 mmol/L   Glucose, Bld 122 (H) 70 - 99 mg/dL   BUN 19 8 - 23 mg/dL   Creatinine, Ser 0.84 0.61 - 1.24 mg/dL   Calcium 8.4 (L) 8.9 - 10.3 mg/dL   GFR calc non Af Amer >60 >60 mL/min    GFR calc Af Amer >60 >60 mL/min    Comment: (NOTE) The eGFR has been calculated using the CKD EPI equation. This calculation has not been validated in all clinical situations. eGFR's persistently <60 mL/min signify possible Chronic Kidney Disease.    Anion gap 9 5 - 15    Comment: Performed at [facility] [address], [place], [place] [address]  Glucose, capillary     Status: Abnormal   Collection Time: 02/02/18  6:42 AM  Result Value Ref Range   Glucose-Capillary 133 (H) 70 - 99 mg/dL   Comment 1 Notify RN   Glucose, capillary     Status: Abnormal   Collection Time:  02/02/18  7:36 AM  Result Value Ref Range   Glucose-Capillary 137 (H) 70 - 99 mg/dL   Comment 1 Capillary Specimen    Comment 2 Notify RN   Glucose, capillary     Status: Abnormal   Collection Time: 02/02/18 12:41 PM  Result Value Ref Range   Glucose-Capillary 137 (H) 70 - 99 mg/dL   Comment 1 Capillary Specimen    Comment 2 Notify RN   Glucose, capillary     Status: Abnormal   Collection Time: 02/02/18  3:37 PM  Result Value Ref Range   Glucose-Capillary 155 (H) 70 - 99 mg/dL   Comment 1 Capillary Specimen    Comment 2 Notify RN   Glucose, capillary     Status: Abnormal   Collection Time: 02/02/18  9:59 PM  Result Value Ref Range   Glucose-Capillary 164 (H) 70 - 99 mg/dL   Comment 1 Notify RN   Protime-INR     Status: Abnormal   Collection Time: 02/03/18  5:31 AM  Result Value Ref Range   Prothrombin Time 27.6 (H) 11.4 - 15.2 seconds   INR 2.60     Comment: Performed at [facility], [place] [address], [place], [place] [address]  Glucose, capillary     Status: Abnormal   Collection Time: 02/03/18  6:37 AM  Result Value Ref Range   Glucose-Capillary 144 (H) 70 - 99 mg/dL   Comment 1 Notify RN   CBC     Status: Abnormal   Collection Time: 02/03/18  8:46 AM  Result Value Ref Range   WBC 10.7 (H) 4.0 - 10.5 K/uL   RBC 3.15 (L) 4.22 - 5.81 MIL/uL     Hemoglobin 9.5 (L) 13.0 - 17.0 g/dL   HCT 29.3 (L) 39.0 - 52.0 %   MCV 93.0 78.0 - 100.0 fL   MCH 30.2 26.0 - 34.0 pg   MCHC 32.4 30.0 - 36.0 g/dL   RDW 13.2 11.5 - 15.5 %   Platelets 163 150 - 400 K/uL    Comment: Performed at [facility], [place] [address] [place], [place] [address]  Basic metabolic panel     Status: Abnormal   Collection Time: 02/03/18  8:46 AM  Result Value Ref Range   Sodium 131 (L) 135 - 145 mmol/L   Potassium 4.5 3.5 - 5.1 mmol/L   Chloride 96 (L) 98 - 111 mmol/L   CO2 26 22 - 32 mmol/L   Glucose, Bld 201 (H) 70 - 99 mg/dL   BUN 16 8 - 23 mg/dL   Creatinine, Ser 0.96 0.61 - 1.24 mg/dL   Calcium 8.8 (L) 8.9 - 10.3 mg/dL   GFR calc non Af Amer >60 >60 mL/min   GFR calc Af Amer >60 >60 mL/min    Comment: (NOTE) The eGFR has been calculated using the CKD EPI equation. This calculation has not been validated in all clinical situations. eGFR's persistently <60 mL/min signify possible Chronic Kidney Disease.    Anion gap 9 5 - 15    Comment: Performed at [facility] [address], [place], [place] [address]  Glucose, capillary     Status: Abnormal   Collection Time: 02/03/18 11:32 AM  Result Value Ref Range   Glucose-Capillary 211 (H) 70 - 99 mg/dL   Comment 1 Capillary Specimen      ImagingResults(Last48hours)  No results found.       Medical Problem List and Plan: 1.  Debility and right  hemiparesis secondary to Right MCA and deconditioning after CABG/MVR             -admit to inpatient rehab today 2.  DVT Prophylaxis/Anticoagulation: Pharmaceutical: Coumadin 3. Pain Management: prn tylenol. Oxycodone or tramadol for more severe pain 4. Mood:  LCSW to follow for evaluation and support.  5. Neuropsych: This patient is not fully capable of making decisions on his own behalf. 6. Skin/Wound Care: Monitor incisions daily and watch for signs of infection. Add protein supplements between meals 7.  Fluids/Electrolytes/Nutrition: Strict I/O. Intake poor due to recent teeth extraction. Check lytes in am.  8. CAD s/p CABG with MVR: Monitor for symptoms with activity. ON coumadin and Lipitor--coreg added today.  9.PAF: In A fib today but rate controlled on warfarin--coreg added today.  10. R-MCA stroke with extension: Left sided weakness resolving. On coumadin.  11. Chronic systolic CHF: Pulmonary edema improving --encourage IS. Monitor weights daily with strict I/O. On Spironolactone, losartan, coreg and Lipitor.  12. H/o BPH/Acute Urinary retention: Question of delirium--will check UA/UCS. Foley was placed today to help decompress bladder. Continue foley for 48 hours--d/c Sunday am and start bladder program. Work on resolving constipation.  13. Constipation: Will increase mirlax to bid--suppository today. and further augment bowel program as likely contributing to retention.  14. ABLA/Leucocytosis: Monitor for signs of infection. Continue to monitor H/H for recovery.  15. Hyponatremia: Continue to monitor for now.  16. T2DM: Monitor BS ac/hs. On levemir daily with SSI for tighter BS control.   Post Admission Physician Evaluation: 1. Functional deficits secondary  to Right MCA infarct and debility. 2. Patient is admitted to receive collaborative, interdisciplinary care between the physiatrist, rehab nursing staff, and therapy team. 3. Patient's level of medical complexity and substantial therapy needs in context of that medical necessity cannot be provided at a lesser intensity of care such as a SNF. 4. Patient has experienced substantial functional loss from his/her baseline which was documented above under the "Functional History" and "Functional Status" headings.  Judging by the patient's diagnosis, physical exam, and functional history, the patient has potential for functional progress which will result in measurable gains while on inpatient rehab.  These gains will be of substantial and  practical use upon discharge  in facilitating mobility and self-care at the household level. 5. Physiatrist will provide 24 hour management of medical needs as well as oversight of the therapy plan/treatment and provide guidance as appropriate regarding the interaction of the two. 6. The Preadmission Screening has been reviewed and patient status is unchanged unless otherwise stated above. 7. 24 hour rehab nursing will assist with bladder management, bowel management, safety, skin/wound care, disease management, medication administration, pain management and patient education  and help integrate therapy concepts, techniques,education, etc. 8. PT will assess and treat for/with: Lower extremity strength, range of motion, stamina, balance, functional mobility, safety, adaptive techniques and equipment, NMR, post-op precautions, pain control, family education.   Goals are: supervision to min assist. 9. OT will assess and treat for/with: ADL's, functional mobility, safety, upper extremity strength, adaptive techniques and equipment, NMR, pain mgt, post-op precautions, family ed.   Goals are: supervision to min assist. Therapy may proceed with showering this patient. 10. SLP will assess and treat for/with: cognition, communication.  Goals are: supervision. 11. Case Management and Social Worker will assess and treat for psychological issues and discharge planning. 12. Team conference will be held weekly to assess progress toward goals and to determine barriers to discharge. 13. Patient will receive  at least 3 hours of therapy per day at least 5 days per week. 14. ELOS: 10-14 days       15. Prognosis:  excellent   I have personally performed a face to face diagnostic evaluation of this patient and formulated the key components of the plan.  Additionally, I have personally reviewed laboratory data, imaging studies, as well as relevant notes and concur with the physician assistant's documentation  above.  Meredith Staggers, MD, [facility]       Bary Leriche, PA-C 02/03/2018

## 2018-02-04 ENCOUNTER — Inpatient Hospital Stay (HOSPITAL_COMMUNITY): Payer: Self-pay | Admitting: Physical Therapy

## 2018-02-04 ENCOUNTER — Inpatient Hospital Stay (HOSPITAL_COMMUNITY): Payer: Medicare Other | Admitting: Speech Pathology

## 2018-02-04 ENCOUNTER — Inpatient Hospital Stay (HOSPITAL_COMMUNITY): Payer: Self-pay | Admitting: Speech Pathology

## 2018-02-04 ENCOUNTER — Inpatient Hospital Stay (HOSPITAL_COMMUNITY): Payer: Self-pay | Admitting: Occupational Therapy

## 2018-02-04 DIAGNOSIS — Z951 Presence of aortocoronary bypass graft: Secondary | ICD-10-CM

## 2018-02-04 DIAGNOSIS — I5022 Chronic systolic (congestive) heart failure: Secondary | ICD-10-CM

## 2018-02-04 LAB — COMPREHENSIVE METABOLIC PANEL
ALK PHOS: 86 U/L (ref 38–126)
ALT: 18 U/L (ref 0–44)
ANION GAP: 9 (ref 5–15)
AST: 19 U/L (ref 15–41)
Albumin: 2.5 g/dL — ABNORMAL LOW (ref 3.5–5.0)
BILIRUBIN TOTAL: 1 mg/dL (ref 0.3–1.2)
BUN: 20 mg/dL (ref 8–23)
CALCIUM: 8.3 mg/dL — AB (ref 8.9–10.3)
CO2: 26 mmol/L (ref 22–32)
Chloride: 95 mmol/L — ABNORMAL LOW (ref 98–111)
Creatinine, Ser: 1.11 mg/dL (ref 0.61–1.24)
GFR calc non Af Amer: >60 mL/min (ref >60)
Glucose, Bld: 123 mg/dL — ABNORMAL HIGH (ref 70–99)
Potassium: 4.2 mmol/L (ref 3.5–5.1)
Sodium: 130 mmol/L — ABNORMAL LOW (ref 135–145)
Total Protein: 5.7 g/dL — ABNORMAL LOW (ref 6.5–8.1)

## 2018-02-04 LAB — GLUCOSE, CAPILLARY
Glucose-Capillary: 118 mg/dL — ABNORMAL HIGH (ref 70–99)
Glucose-Capillary: 144 mg/dL — ABNORMAL HIGH (ref 70–99)
Glucose-Capillary: 146 mg/dL — ABNORMAL HIGH (ref 70–99)
Glucose-Capillary: 123 mg/dL — ABNORMAL HIGH (ref 70–99)

## 2018-02-04 LAB — CBC WITH DIFFERENTIAL/PLATELET
Abs Immature Granulocytes: 0.1 10*3/uL (ref 0.0–0.1)
Basophils Absolute: 0 10*3/uL (ref 0.0–0.1)
Basophils Relative: 0 %
EOS PCT: 1 %
Eosinophils Absolute: 0.1 10*3/uL (ref 0.0–0.7)
HEMATOCRIT: 27.7 % — AB (ref 39.0–52.0)
HEMOGLOBIN: 9 g/dL — AB (ref 13.0–17.0)
Immature Granulocytes: 1 %
LYMPHS PCT: 12 %
Lymphs Abs: 1.3 10*3/uL (ref 0.7–4.0)
MCH: 30.3 pg (ref 26.0–34.0)
MCHC: 32.5 g/dL (ref 30.0–36.0)
MCV: 93.3 fL (ref 78.0–100.0)
MONO ABS: 1 10*3/uL (ref 0.1–1.0)
Monocytes Relative: 9 %
Neutro Abs: 8.2 10*3/uL — ABNORMAL HIGH (ref 1.7–7.7)
Neutrophils Relative %: 77 %
Platelets: 170 10*3/uL (ref 150–400)
RBC: 2.97 MIL/uL — ABNORMAL LOW (ref 4.22–5.81)
RDW: 13.3 % (ref 11.5–15.5)
WBC: 10.6 10*3/uL — ABNORMAL HIGH (ref 4.0–10.5)

## 2018-02-04 LAB — PROTIME-INR
INR: 2.57
PROTHROMBIN TIME: 27.4 s — AB (ref 11.4–15.2)

## 2018-02-04 MED ORDER — CEPHALEXIN 250 MG PO CAPS
500.0000 mg | ORAL_CAPSULE | Freq: Three times a day (TID) | ORAL | Status: DC
  Administered 2018-02-04 – 2018-02-10 (×19): 500 mg via ORAL
  Filled 2018-02-04 (×20): qty 2

## 2018-02-04 NOTE — Progress Notes (Signed)
Physical Therapy Assessment and Plan  Patient Details  Name: Nathan [name]. MRN: [id number] Date of Birth: February 05, 1946  PT Diagnosis: Abnormal posture, Abnormality of gait, Cognitive deficits, Difficulty walking, Hemiplegia dominant, Impaired cognition, Impaired sensation and Muscle weakness Rehab Potential: Good ELOS: 12-14 days   Today's Date: 02/04/2018 PT Individual Time: 4098-1191 PT Individual Time Calculation (min): 60 min    Problem List:  Patient Active Problem List   Diagnosis Date Noted  . Debility 02/03/2018  . 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 Pressure injury of skin 02/02/2018  . Malnutrition of moderate degree 02/01/2018  . Coronary artery disease involving native coronary artery of native heart with angina pectoris ([place])   . S/P CABG (coronary artery bypass graft)   . Atrial fibrillation ([place])   . Chronic combined systolic and diastolic CHF (congestive heart failure) ([place])   . Diabetes mellitus type 2 in nonobese (H[facility])   . Essential hypertension   . History of CVA (cerebrovascular accident)   . Acute blood loss anemia   . Hyponatremia   . S/P left atrial appendage ligation 01/26/2018  . S/P CABG x 4 01/23/2018  . S/P MVR (mitral valve replacement) 01/23/2018  . Mitral valve insufficiency   . On amiodarone therapy 07/28/2017  . CAD in native artery 07/28/2017  . Chronic anticoagulation 05/18/2017  . LBBB (left bundle branch block) 05/18/2017  . Hospital discharge follow-up 05/09/2017  . Paroxysmal atrial fibrillation ([place]) 05/01/2017  . Dilated cardiomyopathy ([place]) 05/01/2017  . DM (diabetes mellitus) type 2, uncontrolled, with ketoacidosis ([place]) 05/01/2017  . Hypotension 05/01/2017  . HLD (hyperlipidemia) 05/01/2017  . Cerebrovascular accident (CVA) due to embolism of right middle cerebral artery ([place]) 04/29/2017  . Hypertensive heart disease with heart failure ([place]) 11/26/2016  . Mixed dyslipidemia  11/26/2016  . Chronic systolic congestive heart failure ([place]) 04/24/2015  . IVCD (intraventricular conduction defect) 04/24/2015    Past Medical History:  Past Medical History:  Diagnosis Date  . Atrial fibrillation with RVR ([place]) 05/01/2017  . Cardiomyopathy ([place]) 05/01/2017  . CHF (congestive heart failure) ([place])   . Chronic combined systolic and diastolic heart failure ([place]) 04/24/2015  . Coronary artery disease   . DM (diabetes mellitus) type 2, uncontrolled, with ketoacidosis ([place]) 05/01/2017  . Essential hypertension 11/26/2016  . History of kidney stones   . HLD (hyperlipidemia) 05/01/2017  . IVCD (intraventricular conduction defect) 04/24/2015  . Mitral valve regurgitation   . Mixed dyslipidemia 11/26/2016  . Stroke (cerebrum) ([place]) 04/29/2017   Past Surgical History:  Past Surgical History:  Procedure Laterality Date  . BRAIN SURGERY    . CARDIAC CATHETERIZATION    . CORONARY ARTERY BYPASS GRAFT N/A 01/23/2018   Procedure: CORONARY ARTERY BYPASS GRAFTING (CABG) x 4 WITH ENDOSCOPIC HARVESTING OF RIGHT GREATER SAPHENOUS VEIN: LIMA TO LAD, SVG TO RCA, SVG TO DIAG, SVG TO OM ;  Surgeon: Ivin Poot, MD;  Location: [facility];  Service: Open Heart Surgery;  Laterality: N/A;  . IR PERCUTANEOUS ART THROMBECTOMY/INFUSION INTRACRANIAL INC DIAG ANGIO  04/29/2017  . IR RADIOLOGIST EVAL & MGMT  05/17/2017  . IR US GUIDE VASC ACCESS LEFT  04/29/2017  . IR US GUIDE VASC ACCESS RIGHT  04/29/2017  . LEFT ATRIAL APPENDAGE OCCLUSION Left 01/23/2018   Procedure: LEFT ATRIAL APPENDAGE OCCLUSION USING ATRICURE ATRICLIP PRO2 LAA EXCLUSION SYSTEM  SIZE 40, LOT # [id number], CAT # [id number], EXP. DATE 2020-04-28;  Surgeon: Prescott Gum, Collier Salina, MD;  Location:  [facility];  Service: Open Heart Surgery;  Laterality: Left;  . MITRAL VALVE REPLACEMENT N/A 01/23/2018   Procedure: MITRAL VALVE (MV) REPLACEMENT USING MAGNA MITRAL EASE PERICARDIAL BIOPROSTHESIS, MODEL [id number], SIZE 29 MM, SERIAL # [id number], EXPIRATION  DATE 2021-03-03.;   Surgeon: Ivin Poot, MD;  Location: [facility];  Service: Open Heart Surgery;  Laterality: N/A;  . MULTIPLE EXTRACTIONS WITH ALVEOLOPLASTY N/A 12/26/2017   Procedure: Extraction of tooth #'s 4,6-11, 18 -27, 30, and 31 with alveoloplasty;  Surgeon: Lenn Cal, DDS;  Location: [facility];  Service: Oral Surgery;  Laterality: N/A;  . RADIOLOGY WITH ANESTHESIA N/A 04/29/2017   Procedure: IR WITH ANESTHESIA;  Surgeon: Radiologist, Medication, MD;  Location: [facility];  Service: Radiology;  Laterality: N/A;  . RIGHT/LEFT HEART CATH AND CORONARY ANGIOGRAPHY N/A 08/18/2017   Procedure: RIGHT/LEFT HEART CATH AND CORONARY ANGIOGRAPHY;  Surgeon: Nelva Bush, MD;  Location: [facility];  Service: Cardiovascular;  Laterality: N/A;  . TEE WITHOUT CARDIOVERSION N/A 09/22/2017   Procedure: TRANSESOPHAGEAL ECHOCARDIOGRAM (TEE);  Surgeon: Lelon Perla, MD;  Location: [facility] ENDOSCOPY;  Service: Cardiovascular;  Laterality: N/A;  . TEE WITHOUT CARDIOVERSION N/A 01/23/2018   Procedure: TRANSESOPHAGEAL ECHOCARDIOGRAM (TEE);  Surgeon: Prescott Gum, Collier Salina, MD;  Location: [facility];  Service: Open Heart Surgery;  Laterality: N/A;  . TONSILLECTOMY      Assessment & Plan Clinical Impression:  Nathan [name] Reason is a 72 year old Left handed male with history of PAF, D2KG, chronic systolic CHF, embolic stroke 25/4270 with mild memory deficits, CAD/ICM with mitral stenosis who was admitted on 07/29/196 for CABG X 4 with bioprosthetic mitral valve replacement by Dr. Prescott Gum. Post op course significant for ABLA, thrombocytopenia, fluid overload, SOB, hypotension as well as development of MS changed with recurrent left sided weakness on 08/02. CT head done revealing interval development of hypoattenuation in right frontal region felt to be extension of prior R-MCA stroke. On IV heparin to coumadin and continues to have intermittent A fib. Cardiology feels that patient may require cardioversion in the distant future and  coreg added today. He has had bouts of confusion and was found to be retaining urine with I/O cath for [phone] cc in the past 24 hours. He continues to have poor po intake and constipation--no BM since admission. Therapy ongoing and patient noted to be debilitated. CIR recommended due to functional deficits.    Patient transferred to CIR on 02/03/2018 .   Patient currently requires max with mobility secondary to muscle weakness, impaired timing and sequencing and decreased coordination, decreased attention, decreased awareness, decreased problem solving, decreased safety awareness and decreased memory and decreased sitting balance, decreased standing balance, decreased postural control, hemiplegia, decreased balance strategies and difficulty maintaining precautions.  Prior to hospitalization, patient was independent  with mobility and lived with Spouse, Family in a House home.  Home access is  Level entry.  Patient will benefit from skilled PT intervention to maximize safe functional mobility, minimize fall risk and decrease caregiver burden for planned discharge home with 24 hour assist.  Anticipate patient will benefit from follow up [facility] at discharge.  PT - End of Session Activity Tolerance: Tolerates < 10 min activity with changes in vital signs Endurance Deficit: Yes Endurance Deficit Description: fatigues quickly, BP drops with standing activity PT Assessment Rehab Potential (ACUTE/IP ONLY): Good PT Barriers to Discharge: Decreased caregiver support;Medical stability PT Patient demonstrates impairments in the following area(s): Balance;Endurance;Motor;Safety;Sensory PT Transfers Functional Problem(s): Bed Mobility;Bed to Chair;Car;Furniture;Floor PT  Locomotion Functional Problem(s): Ambulation;Wheelchair Mobility;Stairs PT Plan PT Intensity: Minimum of 1-2 x/day ,45 to 90 minutes PT Frequency: 5 out of 7 days PT Duration Estimated Length of Stay: 12-14 days PT Treatment/Interventions:  Ambulation/gait training;Balance/vestibular training;Cognitive remediation/compensation;Community reintegration;Discharge planning;Disease management/prevention;DME/adaptive equipment instruction;Functional mobility training;Neuromuscular re-education;Pain management;Patient/family education;Psychosocial support;Stair training;Therapeutic Activities;Therapeutic Exercise;UE/LE Strength taining/ROM;UE/LE Coordination activities;Visual/perceptual remediation/compensation;Wheelchair propulsion/positioning PT Transfers Anticipated Outcome(s): min A PT Locomotion Anticipated Outcome(s): min A with LRAD PT Recommendation Recommendations for Other Services: Neuropsych consult;Therapeutic Recreation consult Therapeutic Recreation Interventions: Pet therapy;Stress management;Outing/community reintergration Follow Up Recommendations: Home health PT Patient destination: Home Equipment Recommended: To be determined  Skilled Therapeutic Intervention Evaluation completed (see details above and below) with education on PT POC and goals and individual treatment initiated with focus on review of sternal precautions and assessment of functional transfers. Pt received semi-reclined in bed, agreeable to PT. No complaints of pain. Semi-reclined to sitting EOB with mod A for trunk control and LLE assist, v/c for sternal precautions. Sit to stand with max A from elevated bed. SPT bed to w/c with max A for balance. Dependent transfer via w/c to therapy gym due to sternal precautions. Pt reports feeling lightheaded in sitting, BP 91/54. Further standing balance and gait assessment deferred secondary to low BP and pt feeling lightheaded and "woozy". SPT w/c to bed with max A. Sit to supine mod A for BLE management. Pt left sidelying in bed, bed alarm in place, needs in reach. Encouraged pt to drink fluids to assist with raising BP.  PT Evaluation Precautions/Restrictions Precautions Precautions:  Fall;Sternal Restrictions Weight Bearing Restrictions: Yes Other Position/Activity Restrictions: sternal precautions Home Living/Prior Functioning Home Living Available Help at Discharge: Family;Available 24 hours/day Type of Home: House Home Access: Level entry Home Layout: One level  Lives With: Spouse;Family Prior Function Level of Independence: Independent with gait;Independent with transfers  Able to Take Stairs?: Yes Driving: Yes Vocation: Retired Leisure: Hobbies-yes (Comment) Comments: (drives, does all the grocery shopping) Vision/Perception  Vision - History Baseline Vision: No visual deficits Vision - Assessment Vision Assessment: Vision impaired - to be further tested in functional context Additional Comments: reports blurriness, trouble with reading and seeing small things Perception Perception: Within Functional Limits Praxis Praxis: Intact  Cognition Overall Cognitive Status: History of cognitive impairments - at baseline Arousal/Alertness: Awake/alert Orientation Level: Oriented X4 Attention: Selective Sustained Attention: Appears intact Selective Attention: Impaired Selective Attention Impairment: Verbal basic;Functional basic Memory: Impaired Memory Impairment: Decreased short term memory Awareness: Impaired Awareness Impairment: Emergent impairment Problem Solving: Impaired Problem Solving Impairment: Verbal basic Safety/Judgment: Impaired Sensation Sensation Light Touch: Appears Intact(pt reports some numbness in LLE at times) Proprioception: Appears Intact Coordination Gross Motor Movements are Fluid and Coordinated: No Fine Motor Movements are Fluid and Coordinated: No Coordination and Movement Description: impaired due to generalized weakness Heel Shin Test: impaired L to R Motor  Motor Motor: Hemiplegia  Mobility Bed Mobility Bed Mobility: Supine to Sit;Sit to Supine Supine to Sit: Moderate Assistance - Patient 50-74% Sit to Supine:  Moderate Assistance - Patient 50-74% Transfers Transfers: Stand Pivot Transfers;Sit to Stand Sit to Stand: Maximal Assistance - Patient 25-49% Stand Pivot Transfers: Maximal Assistance - Patient 25 - 49% Stand Pivot Transfer Details: Verbal cues for sequencing;Verbal cues for technique;Verbal cues for precautions/safety;Manual facilitation for placement;Manual facilitation for weight shifting Trunk/Postural Assessment  Cervical Assessment Cervical Assessment: Exceptions to WFL(forward head) Thoracic Assessment Thoracic Assessment: Exceptions to WFL(rounded shoulders) Lumbar Assessment Lumbar Assessment: Exceptions to WFL(posterior pelvic tilt) Postural Control Postural Control: Deficits on evaluation Trunk Control:  impaired Righting Reactions: impaired  Balance Balance Balance Assessed: Yes Static Sitting Balance Static Sitting - Balance Support: Bilateral upper extremity supported;Feet supported Static Sitting - Level of Assistance: 5: Stand by assistance Dynamic Sitting Balance Dynamic Sitting - Balance Support: No upper extremity supported;Feet supported Dynamic Sitting - Level of Assistance: 4: Min assist Static Standing Balance Static Standing - Balance Support: No upper extremity supported;During functional activity Static Standing - Level of Assistance: 3: Mod assist Dynamic Standing Balance Dynamic Standing - Balance Support: No upper extremity supported;During functional activity Dynamic Standing - Level of Assistance: 3: Mod assist Extremity Assessment   RLE Assessment RLE Assessment: Within Functional Limits General Strength Comments: 4+ to 5/5 LLE Assessment LLE Assessment: Exceptions to [facility] General Strength Comments: see below LLE Strength Left Hip Flexion: 4/5 Left Knee Flexion: 4/5 Left Knee Extension: 4/5 Left Ankle Dorsiflexion: 4/5  See Function Navigator for Current Functional Status.   Refer to Care Plan for Long Term Goals  Recommendations for  other services: Neuropsych and Therapeutic Recreation  Pet therapy, Stress management and Outing/community reintegration  Discharge Criteria: Patient will be discharged from PT if patient refuses treatment 3 consecutive times without medical reason, if treatment goals not met, if there is a change in medical status, if patient makes no progress towards goals or if patient is discharged from hospital.  The above assessment, treatment plan, treatment alternatives and goals were discussed and mutually agreed upon: by patient  Excell Seltzer, PT, DPT  02/04/2018, 12:26 PM

## 2018-02-04 NOTE — Progress Notes (Signed)
Slept from 0200-0615. Multi requests to be repositioned, drink, pain meds, etc. LBM 08/03. Yesterday before transfer to rehab,   Patient received-dulcolax 10mg  tabs, lactulose, miralax and sorbitol. Abd.distended, bowel sounds active. PRN tylenol and oxy ir given x 2 this shift, along with trazodone 25mg . Encouraged I.S.Alfredo MartinezMurray, Zackary Mckeone A

## 2018-02-04 NOTE — Progress Notes (Addendum)
Subjective: Patient feels well.  His words "I feel great".  No concerns.  Nurses are asking whether the PICC line needs to stay in.  No other concerns Objective: BP (!) 92/50 (BP Location: Left Arm)   Pulse 65   Temp 97.9 F (36.6 C) (Oral)   Resp 17   Ht 5' 11.5" (1.816 m)   Wt 89.8 kg   SpO2 100%   BMI 27.23 kg/m   Elderly appearing male in no acute distress. HEENT exam atraumatic, normocephalic, extraocular muscles are intact Chest clear to auscultation Cardiac exam S1-S2 irregular patient has a midline sternal scar that is healed. Abdominal exam active bowel sounds, soft Extremities without significant edema  While in the room I manually checked his blood pressure at 92/60.  Reviewed blood pressure history.  Assessment and plan:  #1 coronary artery disease status post CABG and mitral valve replacement.  He has significant debility postoperatively. 2.  DVT prophylaxis with warfarin Lab Results  Component Value Date   INR 2.57 02/04/2018   INR 2.60 02/03/2018   INR 2.04 02/02/2018     #3.  Atrial fibrillation.  Review of the chart reveals that this is paroxysmal atrial fibrillation.  On clinical exam it appears that he is in A. fib today.  Continue warfarin. Chronic systolic heart failure.  Signs and symptoms are control today. #4.  Fluid electrolyte nutrition: Basic Metabolic Panel:    Component Value Date/Time   NA 130 (L) 02/04/2018 0539   NA 135 08/12/2017 1142   K 4.2 02/04/2018 0539   CL 95 (L) 02/04/2018 0539   CO2 26 02/04/2018 0539   BUN 20 02/04/2018 0539   BUN 17 08/12/2017 1142   CREATININE 1.11 02/04/2018 0539   GLUCOSE 123 (H) 02/04/2018 0539   CALCIUM 8.3 (L) 02/04/2018 0539   Note hyponatremia will follow. #5 constipation: Resolved Type 2 diabetes Lab Results  Component Value Date   HGBA1C 5.9 (H) 01/19/2018   CBG (last 3)  Recent Labs    02/03/18 1607 02/03/18 2140 02/04/18 0649  GLUCAP 100* 133* 118*   Concern for urinary infection.   Reviewed CVTS note.  No need to continue PICC line.  Will discontinue.

## 2018-02-04 NOTE — Progress Notes (Signed)
Per Dr. Donata ClayVan Trigt, Keflex to be started as UA showed moderate amount of leukocytes. He has had a  mitral valve replacement. Want to avoid Cipro or Bactrim as is on Coumadin. Await urine culture.

## 2018-02-04 NOTE — Progress Notes (Signed)
Called Dr.Swords regarding patient bloody urine via foley cath. Orders to watch over night and ordera CBC for am per Dr. Cato MulliganSwords. Lorri FrederickMartha E Jayde Daffin, LPN

## 2018-02-04 NOTE — Evaluation (Signed)
Speech Language Pathology Assessment and Plan  Patient Details  Name: Nathan [name]. MRN: [id number] Date of Birth: Dec 20, 1945  SLP Diagnosis: Cognitive Impairments  Rehab Potential: Excellent ELOS: 12-14 days     Today's Date: 02/04/2018 SLP Individual Time: 1100-1200 SLP Individual Time Calculation (min): 60 min   Problem List:  Patient Active Problem List   Diagnosis Date Noted  . Debility 02/03/2018  . 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 Pressure injury of skin 02/02/2018  . Malnutrition of moderate degree 02/01/2018  . Coronary artery disease involving native coronary artery of native heart with angina pectoris ([place])   . S/P CABG (coronary artery bypass graft)   . Atrial fibrillation ([place])   . Chronic combined systolic and diastolic CHF (congestive heart failure) ([place])   . Diabetes mellitus type 2 in nonobese (HCC)   . Essential hypertension   . History of CVA (cerebrovascular accident)   . Acute blood loss anemia   . Hyponatremia   . S/P left atrial appendage ligation 01/26/2018  . S/P CABG x 4 01/23/2018  . S/P MVR (mitral valve replacement) 01/23/2018  . Mitral valve insufficiency   . On amiodarone therapy 07/28/2017  . CAD in native artery 07/28/2017  . Chronic anticoagulation 05/18/2017  . LBBB (left bundle branch block) 05/18/2017  . Hospital discharge follow-up 05/09/2017  . Paroxysmal atrial fibrillation ([place]) 05/01/2017  . Dilated cardiomyopathy ([place]) 05/01/2017  . DM (diabetes mellitus) type 2, uncontrolled, with ketoacidosis ([place]) 05/01/2017  . Hypotension 05/01/2017  . HLD (hyperlipidemia) 05/01/2017  . Cerebrovascular accident (CVA) due to embolism of right middle cerebral artery ([place]) 04/29/2017  . Hypertensive heart disease with heart failure ([place]) 11/26/2016  . Mixed dyslipidemia 11/26/2016  . Chronic systolic congestive heart failure ([place]) 04/24/2015  . IVCD (intraventricular conduction  defect) 04/24/2015   Past Medical History:  Past Medical History:  Diagnosis Date  . Atrial fibrillation with RVR ([place]) 05/01/2017  . Cardiomyopathy ([place]) 05/01/2017  . CHF (congestive heart failure) ([place])   . Chronic combined systolic and diastolic heart failure ([place]) 04/24/2015  . Coronary artery disease   . DM (diabetes mellitus) type 2, uncontrolled, with ketoacidosis ([place]) 05/01/2017  . Essential hypertension 11/26/2016  . History of kidney stones   . HLD (hyperlipidemia) 05/01/2017  . IVCD (intraventricular conduction defect) 04/24/2015  . Mitral valve regurgitation   . Mixed dyslipidemia 11/26/2016  . Stroke (cerebrum) ([place]) 04/29/2017   Past Surgical History:  Past Surgical History:  Procedure Laterality Date  . BRAIN SURGERY    . CARDIAC CATHETERIZATION    . CORONARY ARTERY BYPASS GRAFT N/A 01/23/2018   Procedure: CORONARY ARTERY BYPASS GRAFTING (CABG) x 4 WITH ENDOSCOPIC HARVESTING OF RIGHT GREATER SAPHENOUS VEIN: LIMA TO LAD, SVG TO RCA, SVG TO DIAG, SVG TO OM ;  Surgeon: Ivin Poot, MD;  Location: [facility];  Service: Open Heart Surgery;  Laterality: N/A;  . IR PERCUTANEOUS ART THROMBECTOMY/INFUSION INTRACRANIAL INC DIAG ANGIO  04/29/2017  . IR RADIOLOGIST EVAL & MGMT  05/17/2017  . IR US GUIDE VASC ACCESS LEFT  04/29/2017  . IR US GUIDE VASC ACCESS RIGHT  04/29/2017  . LEFT ATRIAL APPENDAGE OCCLUSION Left 01/23/2018   Procedure: LEFT ATRIAL APPENDAGE OCCLUSION USING ATRICURE ATRICLIP PRO2 LAA EXCLUSION SYSTEM  SIZE 40, LOT # [id number], CAT # [id number], EXP. DATE 2020-04-28;  Surgeon: Prescott Gum, Collier Salina, MD;  Location: [facility];  Service: Open Heart Surgery;  Laterality: Left;  . MITRAL VALVE  REPLACEMENT N/A 01/23/2018   Procedure: MITRAL VALVE (MV) REPLACEMENT USING MAGNA MITRAL EASE PERICARDIAL BIOPROSTHESIS, MODEL [id number], SIZE 29 MM, SERIAL # [id number], EXPIRATION  DATE 2021-03-03.;  Surgeon: Ivin Poot, MD;  Location: [facility];  Service: Open Heart Surgery;  Laterality: N/A;  . MULTIPLE  EXTRACTIONS WITH ALVEOLOPLASTY N/A 12/26/2017   Procedure: Extraction of tooth #'s 4,6-11, 18 -27, 30, and 31 with alveoloplasty;  Surgeon: Lenn Cal, DDS;  Location: [facility];  Service: Oral Surgery;  Laterality: N/A;  . RADIOLOGY WITH ANESTHESIA N/A 04/29/2017   Procedure: IR WITH ANESTHESIA;  Surgeon: Radiologist, Medication, MD;  Location: [facility];  Service: Radiology;  Laterality: N/A;  . RIGHT/LEFT HEART CATH AND CORONARY ANGIOGRAPHY N/A 08/18/2017   Procedure: RIGHT/LEFT HEART CATH AND CORONARY ANGIOGRAPHY;  Surgeon: Nelva Bush, MD;  Location: [facility];  Service: Cardiovascular;  Laterality: N/A;  . TEE WITHOUT CARDIOVERSION N/A 09/22/2017   Procedure: TRANSESOPHAGEAL ECHOCARDIOGRAM (TEE);  Surgeon: Lelon Perla, MD;  Location: [facility] END[facility];  Service: Cardiovascular;  Laterality: N/A;  . TEE WITHOUT CARDIOVERSION N/A 01/23/2018   Procedure: TRANSESOPHAGEAL ECHOCARDIOGRAM (TEE);  Surgeon: Prescott Gum, Collier Salina, MD;  Location: [facility];  Service: Open Heart Surgery;  Laterality: N/A;  . 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 Prescott Gum. Post op course significant for ABLA, thrombocytopenia, fluid overload, SOB, hypotension as well as development of MS changed with recurrent left sided weakness on 08/02. CT head done revealing interval development of hypoattenuation in right frontal region felt to be extension of prior R-MCA stroke. On IV heparin to coumadin and continues to have intermittent A fib. Cardiology feels that patient may require cardioversion in the distant future and coreg added today. He has had bouts of confusion and was found to be retaining urine with I/O cath for [id number] cc  in the past 24 hours. He continues to have poor po intake and constipation--no BM since admission. Therapy ongoing and patient noted to be debilitated. CIR recommended due to functional deficits and patient admitted 02/03/18.  Patient demonstrates moderate cognitive impairments characterized by impaired selective attention, functional problem solving, emergent awareness and recall with use of strategies that impacts his ability to complete functional and familiar tasks safely. Patient would benefit from skilled SLP intervention to maximize his cognitive function and overall functional independence prior to discharge.    Skilled Therapeutic Interventions          Administered a cognitive-linguistic evaluation, please see above for details. Educated patient and his wife in regards to his current cognitive function and goals of skilled SLP intervention, both verbalized understanding and agreement.   SLP Assessment  Patient will need skilled [facility] during CIR admission    Recommendations  Oral Care Recommendations: Oral care BID Recommendations for Other Services: Neuropsych consult Patient destination: Home Follow up Recommendations: Outpatient SLP;Home Health SLP;24 hour supervision/assistance Equipment Recommended: None recommended by SLP    SLP Frequency 3 to 5 out of 7 days   SLP Duration  SLP Intensity  SLP Treatment/Interventions 12-14 days   Minumum of 1-2 x/day, 30 to 90 minutes  Cognitive remediation/compensation;Environmental controls;Internal/external aids;Therapeutic Activities;Patient/family education;Cueing hierarchy;Functional tasks    Pain No/Denies Pain  Prior  Functioning Type of Home: House  Lives With: Spouse;Family Available Help at Discharge: Family;Available 24 hours/day Vocation: Retired  Function:  Eating Eating   Modified Consistency Diet: Yes Eating Assist Level: Set up assist for;More than reasonable amount of time            Cognition Comprehension Comprehension assist level: Understands complex 90% of the time/cues 10% of the time  Expression   Expression assist level: Expresses complex 90% of the time/cues < 10% of the time  Social Interaction Social Interaction assist level: Interacts appropriately 90% of the time - Needs monitoring or encouragement for participation or interaction.  Problem Solving Problem solving assist level: Solves complex 90% of the time/cues < 10% of the time  Memory Memory assist level: More than reasonable amount of time   Short Term Goals: Week 1: SLP Short Term Goal 1 (Week 1): Patient will demonstrate basic problem solving for functional and familiar tasks with Min A verbal cues.  SLP Short Term Goal 2 (Week 1): Patient will demonstrate recall of daily information with Min A verbal cues for use of compoensatory strategies.  SLP Short Term Goal 3 (Week 1): Patient will demonstrate selective attention in a mildly distracting enviornment for ~30 minutes with supervision verbal cues.  SLP Short Term Goal 4 (Week 1): Patient will self-monitor and correct errors during functional tasks with Min A verbal cues.   Refer to Care Plan for Long Term Goals  Recommendations for other services: Neuropsych  Discharge Criteria: Patient will be discharged from SLP if patient refuses treatment 3 consecutive times without medical reason, if treatment goals not met, if there is a change in medical status, if patient makes no progress towards goals or if patient is discharged from hospital.  The above assessment, treatment plan, treatment alternatives and goals were discussed and mutually agreed upon: by patient and family   Dezmin Kittelson 02/04/2018, 12:21 PM

## 2018-02-04 NOTE — Plan of Care (Signed)
  Problem: Consults Goal: RH GENERAL PATIENT EDUCATION Description See Patient Education module for education specifics. Outcome: Progressing Goal: Skin Care Protocol Initiated - if Braden Score 18 or less Description If consults are not indicated, leave blank or document N/A Outcome: Progressing Goal: Diabetes Guidelines if Diabetic/Glucose > 140 Description If diabetic or lab glucose is > 140 mg/dl - Initiate Diabetes/Hyperglycemia Guidelines & Document Interventions  Outcome: Progressing   Problem: RH BLADDER ELIMINATION Goal: RH STG MANAGE BLADDER WITH ASSISTANCE Description STG Manage Bladder With mod Assistance  Outcome: Progressing Goal: RH STG MANAGE BLADDER WITH MEDICATION WITH ASSISTANCE Description STG Manage Bladder With Medication With min Assistance.  Outcome: Progressing Goal: RH STG MANAGE BLADDER WITH EQUIPMENT WITH ASSISTANCE Description STG Manage Bladder With Equipment With total Assistance  Outcome: Progressing   Problem: RH SKIN INTEGRITY Goal: RH STG SKIN FREE OF INFECTION/BREAKDOWN Description Patients skin will remain free from further infection or breakdown with mod assist.  Outcome: Progressing Goal: RH STG MAINTAIN SKIN INTEGRITY WITH ASSISTANCE Description STG Maintain Skin Integrity With mod Assistance.  Outcome: Progressing Goal: RH STG ABLE TO PERFORM INCISION/WOUND CARE W/ASSISTANCE Description STG Able To Perform Incision/Wound Care With mod/max Assistance.  Outcome: Progressing   Problem: RH SAFETY Goal: RH STG ADHERE TO SAFETY PRECAUTIONS W/ASSISTANCE/DEVICE Description STG Adhere to Safety Precautions With min Assistance/Device.  Outcome: Progressing   Problem: RH PAIN MANAGEMENT Goal: RH STG PAIN MANAGED AT OR BELOW PT'S PAIN GOAL Description < 4  Outcome: Progressing

## 2018-02-04 NOTE — Progress Notes (Addendum)
      [address]       [place] [id number]             [phone]            Subjective: He is participating in therapy this am. He states he had a great night and is feeling better. He is able to use left hand a little more and he does not feel constipated.  Objective: Vital signs in last 24 hours: Temp:  [97.3 F (36.3 C)-98.3 F (36.8 C)] 97.9 F (36.6 C) (08/10 0335) Pulse Rate:  [52-109] 65 (08/10 0822) Resp:  [15-20] 17 (08/10 0335) BP: (91-108)/(50-74) 92/50 (08/10 0822) SpO2:  [96 %-100 %] 100 % (08/10 0822) Weight:  [89.8 kg] 89.8 kg (08/09 1830)   Current Weight  02/03/18 89.8 kg    Hemodynamic parameters for last 24 hours: CVP:  [23 mmHg-30 mmHg] 23 mmHg  Intake/Output from previous day: 08/09 0701 - 08/10 0700 In: 480 [P.O.:480] Out: 725 [Urine:725]   Physical Exam:  Cardiovascular:  RRR, no murmur Pulmonary: Clear to auscultation bilaterally; no rales, wheezes, or rhonchi. Abdomen: Soft, non tender, bowel sounds present. Extremities: Knee high ted hose in place. Wounds: Clean and dry.  No erythema or signs of infection.  Lab Results: CBC: Recent Labs    02/03/18 0846 02/04/18 0539  WBC 10.7* 10.6*  HGB 9.5* 9.0*  HCT 29.3* 27.7*  PLT 163 170   BMET:  Recent Labs    02/03/18 0846 02/04/18 0539  NA 131* 130*  K 4.5 4.2  CL 96* 95*  CO2 26 26  GLUCOSE 201* 123*  BUN 16 20  CREATININE 0.96 1.11  CALCIUM 8.8* 8.3*    PT/INR:  Lab Results  Component Value Date   INR 2.57 02/04/2018   INR 2.60 02/03/2018   INR 2.04 02/02/2018   ABG:  INR: Will add last result for INR, ABG once components are confirmed Will add last 4 CBG results once components are confirmed  Assessment/Plan:  1. CV -PAF with CVR. On Coreg 3.125 mg bid, Spironolactone 12.5 mg daily, Losartan 12.5 mg daily, and Coumadin. INR 2.57. 2.  Pulmonary - On room air. Encourage incentive spirometer. 3.  Acute blood loss anemia - H and H  decreased to 9 and 27.7 4. GU- History of BPH, urinary retention-on Tamulosin. Foley reinserted yesterday. UA yesterday showed moderate leukocytes. WBC this am remains slightly elevated at 10,600. Per Dr. Donata ClayVan Trigt, Keflex to be started as UA showed moderate amount of leukocytes. He has had a  mitral valve replacement. Want to avoid Cipro or Bactrim as is on Coumadin. Await urine culture. Per rehab 5. DM-CBGs 100/133/118. On Insulin. Pre op HGA1C 5.9 6. Remove chest tube sutures in am 7. Deconditioning, right hemiparesis (had previous CVA)-continue with therapy Appreciate CIR's care and assistance  Donielle M ZimmermanPA-C 02/04/2018,9:38 AM [phone]  Slow recovery after mitral valve replacement, CABG x4, and left atrial clipping because of preoperative ischemic cardiomyopathy and mitral regurgitation, diabetes, and right brain stroke about 6 months ago.  Appreciate CIR multidisciplinary care.  Lovett SoxPeter Evaleigh Mccamy MD

## 2018-02-05 ENCOUNTER — Inpatient Hospital Stay (HOSPITAL_COMMUNITY): Payer: Self-pay | Admitting: Occupational Therapy

## 2018-02-05 ENCOUNTER — Inpatient Hospital Stay (HOSPITAL_COMMUNITY): Payer: Self-pay

## 2018-02-05 DIAGNOSIS — R5381 Other malaise: Principal | ICD-10-CM

## 2018-02-05 LAB — CBC
HEMATOCRIT: 26.9 % — AB (ref 39.0–52.0)
HEMOGLOBIN: 8.7 g/dL — AB (ref 13.0–17.0)
MCH: 30.1 pg (ref 26.0–34.0)
MCHC: 32.3 g/dL (ref 30.0–36.0)
MCV: 93.1 fL (ref 78.0–100.0)
Platelets: 203 10*3/uL (ref 150–400)
RBC: 2.89 MIL/uL — AB (ref 4.22–5.81)
RDW: 13.1 % (ref 11.5–15.5)
WBC: 11.1 10*3/uL — AB (ref 4.0–10.5)

## 2018-02-05 LAB — PROTIME-INR
INR: 2.34
PROTHROMBIN TIME: 25.5 s — AB (ref 11.4–15.2)

## 2018-02-05 LAB — GLUCOSE, CAPILLARY
GLUCOSE-CAPILLARY: 121 mg/dL — AB (ref 70–99)
GLUCOSE-CAPILLARY: 128 mg/dL — AB (ref 70–99)
GLUCOSE-CAPILLARY: 223 mg/dL — AB (ref 70–99)
Glucose-Capillary: 104 mg/dL — ABNORMAL HIGH (ref 70–99)

## 2018-02-05 NOTE — Progress Notes (Signed)
Inpatient Rehabilitation  I remain unable to enter HAR account note due to inability to access account notes.  I have opened a ticket with IT.    Charlane FerrettiMelissa Ahnika Hannibal, M.A., CCC/SLP Admission Coordinator  [facility]  Cell [phone]

## 2018-02-05 NOTE — Progress Notes (Signed)
Occupational Therapy Session Note  Patient Details  Name: Nathan [name]. MRN: [id number] Date of Birth: [birth year]  Today's Date: 02/05/2018 OT Individual Time: 0165-5374 OT Individual Time Calculation (min): 60 min    Short Term Goals: Week 1:  OT Short Term Goal 1 (Week 1): Pt will complete toilet transfer with mod A OT Short Term Goal 2 (Week 1): Pt will complete LB dressing with mod A  OT Short Term Goal 3 (Week 1): Pt will follow sternal precautions with min verbal cues within BADL session.   Skilled Therapeutic Interventions/Progress Updates:    1;1. Pt with no c/o pain just "weak". BP assessed as written below. OT dons teds totla A and threads BLE into pants Pt able to bridge hips with feet stabilized for OT to advance pants past hips. Pt completes 10 hip bridges for BLE strengthening required for functional transfers. Pt completes stand pivot transfer with VC for sternal precautions/rocking with MOD A for lifting and balancing throughout transfer. Pt washes UB at sink with VC for attention/sequencing bathing body parts. Pt dons shirt with set up and OT washes hair with shower cap. Pt uses blow dryer and comb to style hair, however OT finished top of head to keep pt maintaining sternal precautions. Exited session with pt returned to bed, exit alarm on, call lightin reach and all needs met  BP Supine no teds: 111/64 Sitting EOB teds: 123/55 Sitting w/c with teds 139/86   Therapy Documentation Precautions:  Precautions Precautions: Fall, Sternal Restrictions Weight Bearing Restrictions: Yes Other Position/Activity Restrictions: sternal precautions General:   Vital Signs: Therapy Vitals Temp: 98.7 F (37.1 C) Temp Source: Oral Pulse Rate: 71 Resp: 20 BP: 107/62 Patient Position (if appropriate): Lying Oxygen Therapy SpO2: 98 % O2 Device: Room Air  See Function Navigator for Current Functional Status.   Therapy/Group: Individual Therapy  Tonny Branch 02/05/2018, 4:48 PM

## 2018-02-05 NOTE — Progress Notes (Signed)
Occupational Therapy Session Note  Patient Details  Name: Nathan PhilipsRandleman D Weng Jr. MRN: [id number] Date of Birth: Oct 14, 1945  Today's Date: 02/05/2018 OT Individual Time: [id number] OT Individual Time Calculation (min): 10 min   Short Term Goals: Week 1:  OT Short Term Goal 1 (Week 1): Pt will complete toilet transfer with mod A OT Short Term Goal 2 (Week 1): Pt will complete LB dressing with mod A  OT Short Term Goal 3 (Week 1): Pt will follow sternal precautions with min verbal cues within BADL session.   Skilled Therapeutic Interventions/Progress Updates:    Pt greeted supine in bed, reporting feeling sick due to fever this morning, wanting to defer OT until he felt better. Provided him with emotional support and gentle in-room tx options but pt still wanting to remain in bed. Notified RN to see him, per request. Pt left with RN at session exit. 50 minutes missed.    Therapy Documentation Precautions:  Precautions Precautions: Fall, Sternal Restrictions Weight Bearing Restrictions: Yes Other Position/Activity Restrictions: sternal precautions Pain: Soreness radiating from ear to back of throat  Pain Assessment Pain Scale: 0-10 Pain Score: 0-No pain ADL: ADL ADL Comments: Please see functional navigator     See Function Navigator for Current Functional Status.   Therapy/Group: Individual Therapy  Nathan [name] A Tinzley Dalia 02/05/2018, 12:32 PM

## 2018-02-05 NOTE — Progress Notes (Signed)
Subjective: Patient has developed hematuria overnight.  He has no bladder or urinary discomfort.  Foley was  placed recently. Objective: BP (!) 92/50 (BP Location: Left Arm)   Pulse 65   Temp 97.9 F (36.6 C) (Oral)   Resp 17   Ht 5' 11.5" (1.816 m)   Wt 89.8 kg   SpO2 100%   BMI 27.23 kg/m   Elderly appearing male in no acute distress. HEENT exam atraumatic, normocephalic, extraocular muscles are intact Chest clear to auscultation Cardiac exam S1-S2 irregular patient has a midline sternal scar that is healed. Abdominal exam active bowel sounds, soft Extremities without significant edema Foley reservoir tented with blood.   Assessment and plan:  #1 coronary artery disease status post CABG and mitral valve replacement.  He has significant debility postoperatively. 2.  DVT prophylaxis with warfarin Lab Results  Component Value Date   INR 2.57 02/04/2018   INR 2.60 02/03/2018   INR 2.04 02/02/2018     #3.  Atrial fibrillation.  Review of the chart reveals that this is paroxysmal atrial fibrillation.  On clinical exam it appears that he is in A. fib today.  Continue warfarin. Chronic systolic heart failure.  Signs and symptoms are control today. #4.  Fluid electrolyte nutrition: Basic Metabolic Panel:    Component Value Date/Time   NA 130 (L) 02/04/2018 0539   NA 135 08/12/2017 1142   K 4.2 02/04/2018 0539   CL 95 (L) 02/04/2018 0539   CO2 26 02/04/2018 0539   BUN 20 02/04/2018 0539   BUN 17 08/12/2017 1142   CREATININE 1.11 02/04/2018 0539   GLUCOSE 123 (H) 02/04/2018 0539   CALCIUM 8.3 (L) 02/04/2018 0539   Note hyponatremia will follow. #5 constipation: Resolved Type 2 diabetes Lab Results  Component Value Date   HGBA1C 5.9 (H) 01/19/2018   CBG (last 3)  Recent Labs    02/03/18 1607 02/03/18 2140 02/04/18 0649  GLUCAP 100* 133* 118*   Concern for urinary infection.  Reviewed CVTS note.  No need to continue PICC line.  Will discontinue.  #6.  New onset  hematuria.  Patient is on warfarin.  Patient had recent Foley placed.  Patient is being treated for urinary tract infection with Keflex.  (Urine culture results pending).  Patient's hemoglobin is stable.  I think the best course of action right now is watchful waiting.  He needs to stay on warfarin for recent mitral valve replacement.  We will continue Foley catheter given patient's previous trouble with urinary retention.

## 2018-02-06 ENCOUNTER — Inpatient Hospital Stay (HOSPITAL_COMMUNITY): Payer: Self-pay | Admitting: Physical Therapy

## 2018-02-06 ENCOUNTER — Inpatient Hospital Stay (HOSPITAL_COMMUNITY): Payer: Self-pay

## 2018-02-06 ENCOUNTER — Inpatient Hospital Stay (HOSPITAL_COMMUNITY): Payer: Self-pay | Admitting: Occupational Therapy

## 2018-02-06 DIAGNOSIS — I69354 Hemiplegia and hemiparesis following cerebral infarction affecting left non-dominant side: Secondary | ICD-10-CM

## 2018-02-06 LAB — BASIC METABOLIC PANEL
Anion gap: 10 (ref 5–15)
BUN: 15 mg/dL (ref 8–23)
CALCIUM: 8.4 mg/dL — AB (ref 8.9–10.3)
CO2: 25 mmol/L (ref 22–32)
Chloride: 95 mmol/L — ABNORMAL LOW (ref 98–111)
Creatinine, Ser: 0.8 mg/dL (ref 0.61–1.24)
Glucose, Bld: 118 mg/dL — ABNORMAL HIGH (ref 70–99)
Potassium: 4 mmol/L (ref 3.5–5.1)
SODIUM: 130 mmol/L — AB (ref 135–145)

## 2018-02-06 LAB — CBC
HCT: 26.2 % — ABNORMAL LOW (ref 39.0–52.0)
Hemoglobin: 9 g/dL — ABNORMAL LOW (ref 13.0–17.0)
MCH: 31.3 pg (ref 26.0–34.0)
MCHC: 34.4 g/dL (ref 30.0–36.0)
MCV: 91 fL (ref 78.0–100.0)
Platelets: 238 10*3/uL (ref 150–400)
RBC: 2.88 MIL/uL — ABNORMAL LOW (ref 4.22–5.81)
RDW: 13 % (ref 11.5–15.5)
WBC: 16 10*3/uL — AB (ref 4.0–10.5)

## 2018-02-06 LAB — URINE CULTURE
Culture: >=100000 — AB
Special Requests: NORMAL

## 2018-02-06 LAB — GLUCOSE, CAPILLARY
GLUCOSE-CAPILLARY: 136 mg/dL — AB (ref 70–99)
GLUCOSE-CAPILLARY: 124 mg/dL — AB (ref 70–99)
Glucose-Capillary: 135 mg/dL — ABNORMAL HIGH (ref 70–99)
Glucose-Capillary: 142 mg/dL — ABNORMAL HIGH (ref 70–99)

## 2018-02-06 LAB — PROTIME-INR
INR: 2.31
PROTHROMBIN TIME: 25.2 s — AB (ref 11.4–15.2)

## 2018-02-06 MED ORDER — CARVEDILOL 3.125 MG PO TABS
3.1250 mg | ORAL_TABLET | Freq: Two times a day (BID) | ORAL | Status: DC
  Administered 2018-02-06 – 2018-02-24 (×33): 3.125 mg via ORAL
  Filled 2018-02-06 (×37): qty 1

## 2018-02-06 NOTE — Progress Notes (Signed)
Physical Therapy Session Note  Patient Details  Name: Nathan PhilipsRandleman D Wierzbicki Jr. MRN: [id number] Date of Birth: 1945/12/03  Today's Date: 02/06/2018 PT Individual Time: 0900-1000 PT Individual Time Calculation (min): 60 min   Short Term Goals: Week 1:  PT Short Term Goal 1 (Week 1): Pt will complete bed mobility with min A with good adherence to sternal precautions PT Short Term Goal 2 (Week 1): Pt will complete transfer with LRAD and mod A consistently PT Short Term Goal 3 (Week 1): Pt will initiate gait training  Skilled Therapeutic Interventions/Progress Updates: Pt received supine in bed; denies pain and agreeable to treatment. Rolling R/L with min guard and bedrails to A with nursing dressing change. Supine>sit via logroll and modA with cues to maintain sternal precautions. Sit >stand modA from elevated EOB, modA stand pivot to w/c. Pt voicing need to void and transported into bathroom via w/c. After several minute rest break following transfer out of bed, pt reporting he no longer needed to void. Transported to gym totalA. Stand pivot modA to mat table, difficulty progressing LLE. One rep sit>stand no UE support from mat table at 21" with min/modA. Following one rep sit >stand, pt requested supine rest break d/t fatigue. MinA sit >supine, modA to return to sitting after 5 min rest break. Gait x2 trials at 298' and 12' with RW and minA for LLE progression, +2 w/c follow for safety d/t poor endurance. Returned to room totalA; pt with OT session immediately following this session. Educated pt re: purpose of staying OOB until next session but demo's poor memory/recall and asks to get back in bed 2 min later, unable to recall previous discussion regarding next session. Alerted scheduling staff to need for rest breaks. Therapist obtained appropriate size w/c with pressure relieving cushion, OT to switch out. Remained in w/c, all needs in reach at end of session.      Therapy Documentation Precautions:   Precautions Precautions: Fall, Sternal Restrictions Weight Bearing Restrictions: Yes Other Position/Activity Restrictions: sternal precautions Pain: Pain Assessment Pain Score: 0-No pain  See Function Navigator for Current Functional Status.   Therapy/Group: Individual Therapy  [name] 02/06/2018, 10:02 AM

## 2018-02-06 NOTE — Progress Notes (Signed)
Speech Language Pathology Daily Session Note  Patient Details  Name: Nathan PhilipsRandleman D Holwerda Jr. MRN: [id number] Date of Birth: Nov 07, 1945  Today's Date: 02/06/2018 SLP Individual Time: [id number] SLP Individual Time Calculation (min): 33 min  Short Term Goals: Week 1: SLP Short Term Goal 1 (Week 1): Patient will demonstrate basic problem solving for functional and familiar tasks with Min A verbal cues.  SLP Short Term Goal 2 (Week 1): Patient will demonstrate recall of daily information with Min A verbal cues for use of compoensatory strategies.  SLP Short Term Goal 3 (Week 1): Patient will demonstrate selective attention in a mildly distracting enviornment for ~30 minutes with supervision verbal cues.  SLP Short Term Goal 4 (Week 1): Patient will self-monitor and correct errors during functional tasks with Min A verbal cues.   Skilled Therapeutic Interventions:Skilled ST services focused on cognitive skills and family education. SLP facilitated basic problem solving skills and error awareness utilizing 3-4 step sequence picture cards, pt required initially mod A verbal cues increasing to max A verbal cues due to reduced attention and increase in frustration.Pt expressed he was extremely tired and required max A verbal cues, as the session continued, for sustained attention. Pt became increasing frustrated with tasks and the request to continue therapy. SLP communicated with pt's wife about prior cognitive skills verse personality. Pt's wife stated that the only issue she noted that has changed cognitively is short term memory and impaired attention, poor frustration tolerance is a baseline behavior.. Pt was left in room with call bell within reach and chair alarm set. Recommend to continue skilled ST services.      Function:  Eating Eating   Modified Consistency Diet: Yes Eating Assist Level: Set up assist for;More than reasonable amount of time           Cognition Comprehension  Comprehension assist level: Follows basic conversation/direction with no assist  Expression   Expression assist level: Expresses basic needs/ideas: With no assist  Social Interaction Social Interaction assist level: Interacts appropriately 75 - 89% of the time - Needs redirection for appropriate language or to initiate interaction.  Problem Solving Problem solving assist level: Solves basic 25 - 49% of the time - needs direction more than half the time to initiate, plan or complete simple activities;Solves basic 50 - 74% of the time/requires cueing 25 - 49% of the time  Memory Memory assist level: Recognizes or recalls 50 - 74% of the time/requires cueing 25 - 49% of the time    Pain Pain Assessment Pain Score: 0-No pain  Therapy/Group: Individual Therapy  Nathan [name]  [facility] 02/06/2018, 4:02 PM

## 2018-02-06 NOTE — Progress Notes (Signed)
Subjective/Complaints:   Objective: Vital Signs: Blood pressure 91/61, pulse 77, temperature 98.7 F (37.1 C), temperature source Oral, resp. rate 18, height 5' 11.5" (1.816 m), weight 89.3 kg, SpO2 96 %. No results found. Results for orders placed or performed during the hospital encounter of 02/03/18 (from the past 72 hour(s))  Glucose, capillary     Status: Abnormal   Collection Time: 02/03/18  9:40 PM  Result Value Ref Range   Glucose-Capillary 133 (H) 70 - 99 mg/dL   Comment 1 Notify RN   CBC WITH DIFFERENTIAL     Status: Abnormal   Collection Time: 02/04/18  5:39 AM  Result Value Ref Range   WBC 10.6 (H) 4.0 - 10.5 K/uL   RBC 2.97 (L) 4.22 - 5.81 MIL/uL   Hemoglobin 9.0 (L) 13.0 - 17.0 g/dL   HCT 27.7 (L) 39.0 - 52.0 %   MCV 93.3 78.0 - 100.0 fL   MCH 30.3 26.0 - 34.0 pg   MCHC 32.5 30.0 - 36.0 g/dL   RDW 13.3 11.5 - 15.5 %   Platelets 170 150 - 400 K/uL   Neutrophils Relative % 77 %   Neutro Abs 8.2 (H) 1.7 - 7.7 K/uL   Lymphocytes Relative 12 %   Lymphs Abs 1.3 0.7 - 4.0 K/uL   Monocytes Relative 9 %   Monocytes Absolute 1.0 0.1 - 1.0 K/uL   Eosinophils Relative 1 %   Eosinophils Absolute 0.1 0.0 - 0.7 K/uL   Basophils Relative 0 %   Basophils Absolute 0.0 0.0 - 0.1 K/uL   Immature Granulocytes 1 %   Abs Immature Granulocytes 0.1 0.0 - 0.1 K/uL    Comment: Performed at [facility], [address] [place], [place] [address]  Comprehensive metabolic panel     Status: Abnormal   Collection Time: 02/04/18  5:39 AM  Result Value Ref Range   Sodium 130 (L) 135 - 145 mmol/L   Potassium 4.2 3.5 - 5.1 mmol/L   Chloride 95 (L) 98 - 111 mmol/L   CO2 26 22 - 32 mmol/L   Glucose, Bld 123 (H) 70 - 99 mg/dL   BUN 20 8 - 23 mg/dL   Creatinine, Ser 1.11 0.61 - 1.24 mg/dL   Calcium 8.3 (L) 8.9 - 10.3 mg/dL   Total Protein 5.7 (L) 6.5 - 8.1 g/dL   Albumin 2.5 (L) 3.5 - 5.0 g/dL   AST 19 15 - 41 U/L   ALT 18 0 - 44 U/L   Alkaline Phosphatase 86 38 - 126 U/L    Total Bilirubin 1.0 0.3 - 1.2 mg/dL   GFR calc non Af Amer >60 >60 mL/min   GFR calc Af Amer >60 >60 mL/min    Comment: (NOTE) The eGFR has been calculated using the CKD EPI equation. This calculation has not been validated in all clinical situations. eGFR's persistently <60 mL/min signify possible Chronic Kidney Disease.    Anion gap 9 5 - 15    Comment: Performed at [facility] [address], [place], [place] [address]  Glucose, capillary     Status: Abnormal   Collection Time: 02/04/18  6:49 AM  Result Value Ref Range   Glucose-Capillary 118 (H) 70 - 99 mg/dL   Comment 1 Notify RN   Protime-INR     Status: Abnormal   Collection Time: 02/04/18  8:48 AM  Result Value Ref Range   Prothrombin Time 27.4 (H) 11.4 - 15.2 seconds   INR 2.57  Comment: Performed at [facility], [place] [address], [place], [place] [address]  Glucose, capillary     Status: Abnormal   Collection Time: 02/04/18 11:47 AM  Result Value Ref Range   Glucose-Capillary 144 (H) 70 - 99 mg/dL  Glucose, capillary     Status: Abnormal   Collection Time: 02/04/18  4:49 PM  Result Value Ref Range   Glucose-Capillary 146 (H) 70 - 99 mg/dL  Glucose, capillary     Status: Abnormal   Collection Time: 02/04/18  9:27 PM  Result Value Ref Range   Glucose-Capillary 123 (H) 70 - 99 mg/dL   Comment 1 Notify RN   Protime-INR     Status: Abnormal   Collection Time: 02/05/18  5:34 AM  Result Value Ref Range   Prothrombin Time 25.5 (H) 11.4 - 15.2 seconds   INR 2.34     Comment: Performed at [facility], [place] [address], [place], [place] [address]  CBC     Status: Abnormal   Collection Time: 02/05/18  5:34 AM  Result Value Ref Range   WBC 11.1 (H) 4.0 - 10.5 K/uL   RBC 2.89 (L) 4.22 - 5.81 MIL/uL   Hemoglobin 8.7 (L) 13.0 - 17.0 g/dL   HCT 26.9 (L) 39.0 - 52.0 %   MCV 93.1 78.0 - 100.0 fL   MCH 30.1 26.0 - 34.0 pg   MCHC 32.3 30.0 - 36.0 g/dL   RDW 13.1 11.5 - 15.5 %   Platelets 203 150 - 400  K/uL    Comment: Performed at [facility], [place] [address] [place], [place] [address]  Glucose, capillary     Status: Abnormal   Collection Time: 02/05/18  6:19 AM  Result Value Ref Range   Glucose-Capillary 104 (H) 70 - 99 mg/dL   Comment 1 Notify RN   Glucose, capillary     Status: Abnormal   Collection Time: 02/05/18 11:45 AM  Result Value Ref Range   Glucose-Capillary 121 (H) 70 - 99 mg/dL  Glucose, capillary     Status: Abnormal   Collection Time: 02/05/18  4:27 PM  Result Value Ref Range   Glucose-Capillary 128 (H) 70 - 99 mg/dL  Glucose, capillary     Status: Abnormal   Collection Time: 02/05/18  9:19 PM  Result Value Ref Range   Glucose-Capillary 223 (H) 70 - 99 mg/dL   Comment 1 Notify RN   Basic metabolic panel     Status: Abnormal   Collection Time: 02/06/18  5:20 AM  Result Value Ref Range   Sodium 130 (L) 135 - 145 mmol/L   Potassium 4.0 3.5 - 5.1 mmol/L   Chloride 95 (L) 98 - 111 mmol/L   CO2 25 22 - 32 mmol/L   Glucose, Bld 118 (H) 70 - 99 mg/dL   BUN 15 8 - 23 mg/dL   Creatinine, Ser 0.80 0.61 - 1.24 mg/dL   Calcium 8.4 (L) 8.9 - 10.3 mg/dL   GFR calc non Af Amer >60 >60 mL/min   GFR calc Af Amer >60 >60 mL/min    Comment: (NOTE) The eGFR has been calculated using the CKD EPI equation. This calculation has not been validated in all clinical situations. eGFR's persistently <60 mL/min signify possible Chronic Kidney Disease.    Anion gap 10 5 - 15    Comment: Performed at [facility] [address] [place], [place] [address]  CBC     Status: Abnormal  Collection Time: 02/06/18  5:20 AM  Result Value Ref Range   WBC 16.0 (H) 4.0 - 10.5 K/uL   RBC 2.88 (L) 4.22 - 5.81 MIL/uL   Hemoglobin 9.0 (L) 13.0 - 17.0 g/dL   HCT 26.2 (L) 39.0 - 52.0 %   MCV 91.0 78.0 - 100.0 fL   MCH 31.3 26.0 - 34.0 pg   MCHC 34.4 30.0 - 36.0 g/dL   RDW 13.0 11.5 - 15.5 %   Platelets 238 150 - 400 K/uL    Comment: Performed at [facility], [place]  [address] [place], [place] [address]  Protime-INR     Status: Abnormal   Collection Time: 02/06/18  5:20 AM  Result Value Ref Range   Prothrombin Time 25.2 (H) 11.4 - 15.2 seconds   INR 2.31     Comment: Performed at [facility] [address] [place], [place] [address]  Glucose, capillary     Status: Abnormal   Collection Time: 02/06/18  7:12 AM  Result Value Ref Range   Glucose-Capillary 136 (H) 70 - 99 mg/dL   Comment 1 Notify RN      HEENT: edendulous Cardio: RRR and no murmur Resp: CTA B/L and unlabored GI: BS positive and Non tender Extremity:  No Edema Skin:   Intact and Wound C/D/I and sternotomy and R SVG site Neuro: Alert/Oriented, Normal Sensory, Abnormal Motor 4/5 LUE and LLE, 5/5 on Right side and Abnormal FMC Ataxic/ dec FMC Musc/Skel:  Other No pain with ROM Gen NAD   Assessment/Plan: 1. Functional deficits secondary to RIght MCA infarct  which require 3+ hours per day of interdisciplinary therapy in a comprehensive inpatient rehab setting. Physiatrist is providing close team supervision and 24 hour management of active medical problems listed below. Physiatrist and rehab team continue to assess barriers to discharge/monitor patient progress toward functional and medical goals. FIM: Function - Bathing Position: Bed(and sitting EOB) Body parts bathed by patient: Right arm, Left arm, Chest, Abdomen, Front perineal area, Right upper leg, Left upper leg Body parts bathed by helper: Buttocks, Left lower leg, Right lower leg Assist Level: Touching or steadying assistance(Pt > 75%)  Function- Upper Body Dressing/Undressing What is the patient wearing?: Pull over shirt/dress Pull over shirt/dress - Perfomed by patient: Thread/unthread right sleeve, Thread/unthread left sleeve Pull over shirt/dress - Perfomed by helper: Put head through opening, Pull shirt over trunk Assist Level: Touching or steadying assistance(Pt > 75%) Function - Lower Body  Dressing/Undressing What is the patient wearing?: Pants, Non-skid slipper socks Position: Bed Pants- Performed by helper: Thread/unthread right pants leg, Thread/unthread left pants leg, Pull pants up/down Non-skid slipper socks- Performed by helper: Don/doff right sock, Don/doff left sock Assist for footwear: Dependant Assist for lower body dressing: Touching or steadying assistance (Pt > 75%)  Function - Toileting Toileting activity did not occur: No continent bowel/bladder event Toileting steps completed by helper: Performs perineal hygiene Toileting Assistive Devices: Other (comment)(foley cath)  Function - Toilet Transfers Toilet transfer activity did not occur: Refused  Function - Chair/bed transfer Chair/bed transfer method: Stand pivot Chair/bed transfer assist level: Maximal assist (Pt 25 - 49%/lift and lower) Chair/bed transfer details: Verbal cues for sequencing, Verbal cues for technique, Verbal cues for precautions/safety, Manual facilitation for weight shifting, Manual facilitation for placement  Function - Locomotion: Wheelchair Will patient use wheelchair at discharge?: No Function - Locomotion: Ambulation Ambulation activity did not occur: Safety/medical concerns  Function - Comprehension Comprehension: Auditory Comprehension assist level: Follows complex  conversation/direction with extra time/assistive device  Function - Expression Expression: Verbal Expression assist level: Expresses complex ideas: With extra time/assistive device  Function - Social Interaction Social Interaction assist level: Interacts appropriately 90% of the time - Needs monitoring or encouragement for participation or interaction.  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 DVT Prophylaxis/Anticoagulation: Pharmaceutical:Coumadin 3. Pain Management:prn tylenol. Oxycodone or tramadol for more severe pain 4. Mood:LCSW to follow for evaluation and support. 5. Neuropsych: This patientis not fullycapable of making decisions on hisown behalf. 6. Skin/Wound Care:incisions look good Add protein supplements between meals 7. Fluids/Electrolytes/Nutrition:Strict I/O. Intake poor due to recent teeth extraction. Check lytes in am.  8. CAD s/p CABG with MVR: Monitor for symptoms with activity. ON coumadin and Lipitor--coreg added  9.PAF: In A fib today but rate controlled on warfarin--coreg added today.  10. R-MCA stroke with extension: Left sided weakness resolving. On coumadin.  11. Chronic systolic CHF: Pulmonary edema improving --encourage IS. Monitor weights daily with strict I/O. On Spironolactone, losartan, coreg and Lipitor.  12. H/o BPH/Acute Urinary retention: Question of delirium--will check UA/UCS. Foleywasplaced today to help decompress bladder. Continue foleyfor48 hours-hematuria persists will ask uro for rec, no clots  13. Constipation: Willincrease mirlax to bid--suppository today. and furtheraugment bowel programaslikely contributing to retention.  14. ABLA/Leucocytosis: Monitor for signs of infection. Continue to monitor H/H for recovery.  15. Hyponatremia: Continue to monitor for now.  16. T2DM: Monitor BS ac/hs. On levemir daily with SSI for tighter BS control.  LOS (Days) 3 A FACE TO FACE EVALUATION WAS PERFORMED  Charlett Blake 02/06/2018, 8:39 AM

## 2018-02-06 NOTE — Progress Notes (Signed)
Called to gym to assess patient who was feeling nauseated, "disoriented in space" and R shoulder pain. Described pain as similar to what he has had since surgery. Vital signs charted. EKG taken per order and verbally reported to PA. Patient returned to room, in no distress at this time.

## 2018-02-06 NOTE — Care Management Note (Signed)
[facility] Individual Statement of Services  Patient Name:  Nathan PhilipsRandleman D Stoneking Jr.  Date:  02/06/2018  Welcome to the [facility].  Our goal is to provide you with an individualized program based on your diagnosis and situation, designed to meet your specific needs.  With this comprehensive rehabilitation program, you will be expected to participate in at least 3 hours of rehabilitation therapies Monday-Friday, with modified therapy programming on the weekends.  Your rehabilitation program will include the following services:  Physical Therapy (PT), Occupational Therapy (OT), Speech Therapy (ST), 24 hour per day rehabilitation nursing, Neuropsychology, Case Management (Social Worker), Rehabilitation Medicine, Nutrition Services and Pharmacy Services  Weekly team conferences will be held on Wedensday to discuss your progress.  Your Social Worker will talk with you frequently to get your input and to update you on team discussions.  Team conferences with you and your family in attendance may also be held.  Expected length of stay: 12-15 days Overall anticipated outcome: supervision-min assist level  Depending on your progress and recovery, your program may change. Your Social Worker will coordinate services and will keep you informed of any changes. Your Social Worker's name and contact numbers are listed  below.  The following services may also be recommended but are not provided by the [facility]:   Driving Evaluations  Home Health Rehabiltiation Services  Outpatient Rehabilitation Services    Arrangements will be made to provide these services after discharge if needed.  Arrangements include referral to agencies that provide these services.  Your insurance has been verified to be:  [employer]-Medicare Your primary doctor is:  [name]  Pertinent information will be shared with your doctor and your insurance company.  Social  Worker:  [name], SW [phone] or [phone]) [phone]  Information discussed with and copy given to patient by: [name], [name], 02/06/2018, 11:37 AM

## 2018-02-06 NOTE — Progress Notes (Signed)
Social Work  Social Work Assessment and Plan  Patient Details  Name: Woodfin Kiss. MRN: [id number] Date of Birth: 02/05/1946  Today's Date: 02/06/2018  Problem List:  Patient Active Problem List   Diagnosis Date Noted  . Debility 02/03/2018  . 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 Pressure injury of skin 02/02/2018  . Malnutrition of moderate degree 02/01/2018  . Coronary artery disease involving native coronary artery of native heart with angina pectoris (HCC)   . S/P CABG (coronary artery bypass graft)   . Atrial fibrillation (HCC)   . Chronic combined systolic and diastolic CHF (congestive heart failure) (HCC)   . Diabetes mellitus type 2 in nonobese (HCC)   . Essential hypertension   . History of CVA (cerebrovascular accident)   . Acute blood loss anemia   . Hyponatremia   . S/P left atrial appendage ligation 01/26/2018  . S/P CABG x 4 01/23/2018  . S/P MVR (mitral valve replacement) 01/23/2018  . Mitral valve insufficiency   . On amiodarone therapy 07/28/2017  . CAD in native artery 07/28/2017  . Chronic anticoagulation 05/18/2017  . LBBB (left bundle branch block) 05/18/2017  . Hospital discharge follow-up 05/09/2017  . Paroxysmal atrial fibrillation (HCC) 05/01/2017  . Dilated cardiomyopathy (HCC) 05/01/2017  . DM (diabetes mellitus) type 2, uncontrolled, with ketoacidosis (HCC) 05/01/2017  . Hypotension 05/01/2017  . HLD (hyperlipidemia) 05/01/2017  . Cerebrovascular accident (CVA) due to embolism of right middle cerebral artery (HCC) 04/29/2017  . Hypertensive heart disease with heart failure (HCC) 11/26/2016  . Mixed dyslipidemia 11/26/2016  . Chronic systolic congestive heart failure (HCC) 04/24/2015  . IVCD (intraventricular conduction defect) 04/24/2015   Past Medical History:  Past Medical History:  Diagnosis Date  . Atrial fibrillation with RVR (HCC) 05/01/2017  . Cardiomyopathy (HCC) 05/01/2017  . CHF  (congestive heart failure) (HCC)   . 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 Coronary artery disease   . DM (diabetes mellitus) type 2, uncontrolled, with ketoacidosis (HCC) 05/01/2017  . Essential hypertension 11/26/2016  . History of kidney stones   . HLD (hyperlipidemia) 05/01/2017  . IVCD (intraventricular conduction defect) 04/24/2015  . Mitral valve regurgitation   . Mixed dyslipidemia 11/26/2016  . Stroke (cerebrum) (HCC) 04/29/2017   Past Surgical History:  Past Surgical History:  Procedure Laterality Date  . BRAIN SURGERY    . CARDIAC CATHETERIZATION    . CORONARY ARTERY BYPASS GRAFT N/A 01/23/2018   Procedure: CORONARY ARTERY BYPASS GRAFTING (CABG) x 4 WITH ENDOSCOPIC HARVESTING OF RIGHT GREATER SAPHENOUS VEIN: LIMA TO LAD, SVG TO RCA, SVG TO DIAG, SVG TO OM ;  Surgeon: Kerin Perna, MD;  Location: [facility] OR;  Service: Open Heart Surgery;  Laterality: N/A;  . IR PERCUTANEOUS ART THROMBECTOMY/INFUSION INTRACRANIAL INC DIAG ANGIO  04/29/2017  . IR RADIOLOGIST EVAL & MGMT  05/17/2017  . IR US GUIDE VASC ACCESS LEFT  04/29/2017  . IR US GUIDE VASC ACCESS RIGHT  04/29/2017  . LEFT ATRIAL APPENDAGE OCCLUSION Left 01/23/2018   Procedure: LEFT ATRIAL APPENDAGE OCCLUSION USING ATRICURE ATRICLIP PRO2 LAA EXCLUSION SYSTEM  SIZE 40, LOT # [id number], CAT # [id number], EXP. DATE 2020-04-28;  Surgeon: Donata Clay, Theron Arista, MD;  Location: [facility] OR;  Service: Open Heart Surgery;  Laterality: Left;  . MITRAL VALVE REPLACEMENT N/A 01/23/2018   Procedure: MITRAL VALVE (MV) REPLACEMENT USING MAGNA MITRAL EASE PERICARDIAL BIOPROSTHESIS, MODEL [id number], SIZE 29 MM, SERIAL # [id number], EXPIRATION  DATE 2021-03-03.;  Surgeon: Donata ClayVan Trigt, Theron AristaPeter, MD;  [facility];  Service: Open Heart Surgery;  Laterality: N/A;  . MULTIPLE EXTRACTIONS WITH ALVEOLOPLASTY N/A 12/26/2017   Procedure: Extraction of tooth #'s 4,6-11, 18 -27, 30, and 31 with alveoloplasty;  Surgeon: Charlynne PanderKulinski, Ronald F, DDS;  Location: [facility]  OR;  Service: Oral Surgery;  Laterality: N/A;  . RADIOLOGY WITH ANESTHESIA N/A 04/29/2017   Procedure: IR WITH ANESTHESIA;  Surgeon: Radiologist, Medication, MD;  Location: [facility] OR;  Service: Radiology;  Laterality: N/A;  . RIGHT/LEFT HEART CATH AND CORONARY ANGIOGRAPHY N/A 08/18/2017   Procedure: RIGHT/LEFT HEART CATH AND CORONARY ANGIOGRAPHY;  Surgeon: Yvonne KendallEnd, Christopher, MD;  Location: [facility] INVASIVE CV LAB;  Service: Cardiovascular;  Laterality: N/A;  . TEE WITHOUT CARDIOVERSION N/A 09/22/2017   Procedure: TRANSESOPHAGEAL ECHOCARDIOGRAM (TEE);  Surgeon: Lewayne Buntingrenshaw, Brian S, MD;  [facility];  Service: Cardiovascular;  Laterality: N/A;  . TEE WITHOUT CARDIOVERSION N/A 01/23/2018   Procedure: TRANSESOPHAGEAL ECHOCARDIOGRAM (TEE);  Surgeon: Donata ClayVan Trigt, Theron AristaPeter, MD;  [facility];  Service: Open Heart Surgery;  Laterality: N/A;  . TONSILLECTOMY     Social History:  reports that he quit smoking about 25 years ago. His smoking use included cigarettes. He has never used smokeless tobacco. He reports that he drinks about 3.0 standard drinks of alcohol per week. He reports that he does not use drugs.  Family / Support Systems Marital Status: Married Patient Roles: Spouse, Parent Spouse/Significant Other: [name] [phone] Children: Miller-son Other Supports: Friends and church members Anticipated Caregiver: Theatre stage managerMiller and wife  Ability/Limitations of Caregiver: Wife works days as Management consultantschool teacher Caregiver Availability: 24/7 Family Dynamics: Close knit with small family unit, they rely upon one another and will do what the other needs. Pt has ben having medical proceuderes since last Nov. Pt wants to be independent like he was prior to admission but realizes he has a ways to go.  Social History Preferred language: English Religion: Christian Cultural Background: No issues Education: High School Read: Yes Write: Yes Employment Status: Retired Fish farm managerLegal Hisotry/Current Legal Issues: No  issues Guardian/Conservator: none-according to MD pt is not fully capable of making his own decisions while here, will look toward his wife to make any decisions while here   Abuse/Neglect Abuse/Neglect Assessment Can Be Completed: Yes Physical Abuse: Denies Verbal Abuse: Denies Sexual Abuse: Denies Exploitation of patient/patient's resources: Denies Self-Neglect: Denies  Emotional Status Pt's affect, behavior adn adjustment status: Pt and wife report he is very independent and wants to get back to this level. He is not one to ask for help and will try to do it on his own. He agrees he is somewhat hard headed. He recovered from the first stroke he had in Nov 2018 and hopes to do the same this time. Recent Psychosocial Issues: other health issues-past history of CVA and cardiac issues Pyschiatric History: No history deferred depression screen due to adjusting to the new unit and is exhausted with therapies. Do feel he would benefit from seeing neuro-psych while here for coping. Will make referral to be seen next week. Substance Abuse History: No issues  Patient / Family Perceptions, Expectations & Goals Pt/Family understanding of illness & functional limitations: Pt and wife can explain his cardiac surgery and now weakness from stroke or symptoms like a stroke. Wife is concerned about his bladder issues and has spoken with the MD regarding this. MD is consulting a urologist. Both feel their questions have been addressed and are looking forward to his rehab stay.  Premorbid pt/family roles/activities: Father, husband, retiree, church member, friend, etc Anticipated changes in roles/activities/participation: resume Pt/family expectations/goals: Pt states: " I want to be able to walk before I leave here."  Wife states: " I need him to be walking my son and I can't walk him."  [employer] IncCommunity Resources Community Agencies: Other (Comment)(Followed by many MD's for health issues) Premorbid Home Care/DME  Agencies: None Transportation available at discharge: Wife Resource referrals recommended: Neuropsychology, Support group (specify)  Discharge Planning Living Arrangements: Spouse/significant other, Children Support Systems: Spouse/significant other, Children, Manufacturing engineerriends/neighbors, Psychologist, clinicalChurch/faith community Type of Residence: Private residence Insurance Resources: Media plannerrivate Insurance (specify)(UHC_Medicare) Surveyor, quantityinancial Resources: Social Security, Family Support Financial Screen Referred: No Living Expenses: Own Money Management: Spouse, Patient Does the patient have any problems obtaining your medications?: No Home Management: Wife does the home management Patient/Family Preliminary Plans: Return home with wife and son, wife will be going back to school 8/19 but their son is not employed and can be there while she is working. He is able to assist according to both of them if needed. Pt should do well once medical issues are stable and he ihas built up his strength to participate in therapies. Social Work Anticipated Follow Up Needs: HH/OP, Support Group  Clinical Impression Pleasant couple who have been married for numerous years. Wife is supportive and willing to assist but wants pt to do all that he can for himself before he goes home. She is a Runner, broadcasting/film/videoteacher and will be going back to work 8/19 but their son is there while she is gone. Both are optimistic regarding his recover and hopeful he will get back his strength and movement in his left side back, like he has in the past. Do feel he would benefit from seeing neuro-psych while here, will make referral.  Lucy ChrisDupree, Macsen Nuttall G 02/06/2018, 11:34 AM

## 2018-02-06 NOTE — IPOC Note (Signed)
Overall Plan of Care [facility](IPOC) Patient Details Name: Nathan PhilipsRandleman D Jimenez Jr. 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     Functional Problem List: Nursing Bladder, Bowel, Edema, Endurance, Medication Management, Motor, Skin Integrity, Safety, Pain  PT Balance, Endurance, Motor, Safety, Sensory  OT Balance, Cognition, Endurance, Motor, Pain, Safety  SLP Cognition  TR         Basic ADL's: OT Eating, Grooming, Bathing, Dressing, Toileting     Advanced  ADL's: OT       Transfers: PT Bed Mobility, Bed to Chair, Car, Furniture, Floor  OT Toilet, Research scientist (life sciences)Tub/Shower     Locomotion: PT Ambulation, Psychologist, prison and probation servicesWheelchair Mobility, Stairs     Additional Impairments: OT Fuctional Use of Upper Extremity  SLP Social Cognition   Problem Solving, Memory, Attention, Awareness  TR      Anticipated Outcomes Item Anticipated Outcome  Self Feeding Mod I  Swallowing      Basic self-care  supervision  Toileting  supervision   Bathroom Transfers supervision   Bowel/Bladder  Min assist  Transfers  min A  Locomotion  min A with LRAD  Communication     Cognition  Supervision   Pain  < 4  Safety/Judgment  Min assist   Therapy Plan: PT Intensity: Minimum of 1-2 x/day ,45 to 90 minutes PT Frequency: 5 out of 7 days PT Duration Estimated Length of Stay: 12-14 days OT Intensity: Minimum of 1-2 x/day, 45 to 90 minutes OT Frequency: 5 out of 7 days OT Duration/Estimated Length of Stay: 14-17 days SLP Intensity: Minumum of 1-2 x/day, 30 to 90 minutes SLP Frequency: 3 to 5 out of 7 days SLP Duration/Estimated Length of Stay: 12-14 days     Team Interventions: Nursing Interventions Patient/Family Education, Disease  Management/Prevention, Skin Care/Wound Management, Pain Management, Bladder Management, Bowel Management, Medication Management, Dysphagia/Aspiration Precaution Training  PT interventions Ambulation/gait training, Warden/rangerBalance/vestibular training, Cognitive remediation/compensation, Community reintegration, Discharge planning, Disease management/prevention, DME/adaptive equipment instruction, Functional mobility training, Neuromuscular re-education, Pain management, Patient/family education, Psychosocial support, Stair training, Therapeutic Activities, Therapeutic Exercise, UE/LE Strength taining/ROM, UE/LE Coordination activities, Visual/perceptual remediation/compensation, Wheelchair propulsion/positioning  OT Interventions Cognitive remediation/compensation, FirefighterCommunity reintegration, Discharge planning, DME/adaptive equipment instruction, Functional mobility training, Neuromuscular re-education, Patient/family education, Self Care/advanced ADL retraining, Therapeutic Activities, Therapeutic Exercise, UE/LE Coordination activities, UE/LE Strength taining/ROM, Wheelchair propulsion/positioning  SLP Interventions Cognitive remediation/compensation, Environmental controls, Internal/external aids, Therapeutic Activities, Patient/family education, Financial traderCueing hierarchy, Functional tasks  TR Interventions    SW/CM Interventions Discharge Planning, Psychosocial Support, Patient/Family Education   Barriers to Discharge MD  Medical stability and Weight bearing restrictions  Nursing Weight bearing restrictions, Other (comments)(urinary retention; sternal precautions)    PT Decreased caregiver support, Medical stability    OT      SLP      SW       Team Discharge Planning: Destination: PT-Home ,OT- Home , SLP-Home Projected Follow-up: PT-Home health PT, OT-  Home health OT, SLP-Outpatient SLP, Home Health SLP, 24 hour supervision/assistance Projected Equipment Needs: PT-To be determined, OT-  , SLP-None  recommended by SLP Equipment Details: PT- , OT-likely a tub transfer bench, but TBD Patient/family involved in discharge planning: PT- Patient,  OT-Patient, SLP-Patient, Family member/caregiver  MD ELOS: 10-14d Medical Rehab Prognosis:  Good Assessment:   71 year  old Left handed male with history of PAF, T2DM, chronic systolic CHF, 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 Donata ClayVan Trigt. Post op course significant for ABLA, thrombocytopenia, fluid overload, SOB, hypotension as well as development of MS changed with recurrent left sided weakness on 08/02. CT head done revealing interval development of hypoattenuation in right frontal region felt to be extension of prior R-MCA stroke. On IV heparin to coumadin and continues to have intermittent A fib. Cardiology feels that patient may require cardioversion in the distant future    Now requiring 24/7 Rehab RN,MD, as well as CIR level PT, OT and SLP.  Treatment team will focus on ADLs and mobility with goals set at Sup See Team Conference Notes for weekly updates to the plan of care

## 2018-02-06 NOTE — Progress Notes (Signed)
Occupational Therapy Session Note  Patient Details  Name: Nathan PhilipsRandleman D Kubicki Jr. MRN: [id number] Date of Birth: [birth year]  Today's Date: 02/06/2018 OT Individual Time: 1005-1110 and 1300-1400 OT Individual Time Calculation (min): 65 min and 60 min OT Missed Time: 10 min and 15 min (pt fatigue)   Short Term Goals: Week 1:  OT Short Term Goal 1 (Week 1): Pt will complete toilet transfer with mod A OT Short Term Goal 2 (Week 1): Pt will complete LB dressing with mod A  OT Short Term Goal 3 (Week 1): Pt will follow sternal precautions with min verbal cues within BADL session.   Skilled Therapeutic Interventions/Progress Updates:    Session One: Pt seen for OT session focusing on ADL re-training and functional activity tolerance. Pt sitting up in w/c upon arrival with wife present. Pt's wife provided PLOF and background on pt's personality tendencies, reporting pt requires lots of encouragement for participation and independence with ADLs/IADLs. Pt voicing significant fatigue, willing to participate in therapy as able though required lots of encouragement and re-direction to task to complete modified bathing/dressing session as pt perseverative on returning to bed.  Attempted to complete grooming task in standing, however, pt unable to tolerate >~10 seconds in standing before requiring seated rest break. Mod A with "rocking technique" for sit> stand. Pt able to independently recall sternal pre-cautions and functional implications for transfer methods.  Therapist provided list of 3 self-care tasks that needed to be completed before pt could return to bed. Pt unable to immediately recall verbal list, therefore, therapist provided written list of tasks that needed to be completed.  Pt required max cuing for orientation of donning shirt and rest breaks throughout w/c level ADLs.  When pt completed 3 tasks, required significant rest break before completing mod A stand pivot transfer to EOB. Mod A to  return to supine. Pt left sitting up in bed at end of session. Provided with magic cup for snack and encouragement and education provided regarding importance of proper nutrition for energy and healing as pt has been refusing meals.Bed alarm on and wife present.   Session Two: Pt seen for OT session focusing on ADL re-training, neuro re-ed, and cognitive remediation. Pt awake in supine upon arrival, agreeable to tx session. Pt required increased time and cuing for redirection to task when transitioning to sitting EOB as pt easily distracted by internal stimuli. Required min A and multimodal cuing for sequencing to come into sitting EOB while maintaining sternal pre-cautions. Throughout session, pt completed stand pivot transfers at mod A overall. When transferring to L, pt unable to advance L LE despite assist for weight shift and multi-modal cuing to extremity.  He completed oral care seated in w/c at sink. Pt able to independently initiate functional use of L UE, however, very poor attention to hand and items in it with divided attention, dropping toothbrush throughout and then leaving toothbrush in hand when task completed without awareness.  Pt taken to therapy gym total A in w/c. Completed peg board pattern replication activity seated EOM. Cuing to initiate L UE into task, requiring frequent cuing and redirection to task as pt easily distracted in highly stimulating environment and demonstrating decreased functional use in L UE with distraction. Pt quickly began to have complaints of feeling "disoriented, nauseated, and dizzy". Pt's affect remained at baseline with pt smiling and laughing despite verbal complaints. Pt placed in sidelying, unable to sequence laying on back. BP in side-lying 94/49. RN made aware and assessed pt.  HR 64 taken manually. Following rest, pt able to transfer to sitting EOM and returned to w/c. RN requesting return to room to asses vitals manually. Pt left seated in w/c with wife  and RN present. Pt voicing nausea subsiding.  Cont to provide education to pt and wife regarding role of OT, POC, current deficits and functional implications, therapy goals and d/c planning.    Therapy Documentation Precautions:  Precautions Precautions: Fall, Sternal Restrictions Weight Bearing Restrictions: Yes Other Position/Activity Restrictions: sternal precautions Pain:   No/denies pain ADL: ADL ADL Comments: Please see functional navigator  See Function Navigator for Current Functional Status.   Therapy/Group: Individual Therapy  [name] 02/06/2018, 7:06 AM

## 2018-02-06 NOTE — Consult Note (Signed)
Urology Consult Note   Requesting Attending Physician:  Marcello FennelPatel, Ankit Anil, MD Service Providing Consult: Urology  Consulting Attending: Gaspar GarbeJohn Wren, MD  Reason for Consult: hematuria  HPI: Nathan D [name] is seen in consultation for reasons noted above at the request of Marcello FennelPatel, Ankit Anil, MD.  Patient is a [age]M with multiple medical comorbidities as described below who remains admitted for management s/p CABG on 7/29.  Late last week, he was noted to be in urinary retention with I&O catheterizations measuring between [phone] mL. This coincided with delirium. A Foley catheter was ultimately placed. UCx from 8/10 grew >100K CFU Citrobacter koseri. He is currently treated with PO Keflex. Yesterday, patient was noted with gross hematuria. There have been no reports of clots within catheter.  Patient denies prior history of hematuria. He does report a history of BPH and nephrolithiasis managed [place] his local [place]rolog[place]   Past Medical History: Past Medical History:  Diagnosis Date  . Atrial fibrillation with RVR (HCC) 05/01/2017  . Cardiomyopathy (HCC) 05/01/2017  . CHF (congestive heart failure) (HCC)   . 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 Coronary artery disease   . DM (diabetes mellitus) type 2, uncontrolled, with ketoacidosis (HCC) 05/01/2017  . Essential hypertension 11/26/2016  . History of kidney stones   . HLD (hyperlipidemia) 05/01/2017  . IVCD (intraventricular conduction defect) 04/24/2015  . Mitral valve regurgitation   . Mixed dyslipidemia 11/26/2016  . 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 BRAIN SURGERY    . CARDIAC CATHETERIZATION    . CORONARY ARTERY BYPASS GRAFT N/A 01/23/2018   Procedure: CORONARY ARTERY BYPASS GRAFTING (CABG) x 4 WITH ENDOSCOPIC HARVESTING OF RIGHT GREATER SAPHENOUS VEIN: LIMA TO LAD, SVG TO RCA, SVG TO DIAG, SVG  TO OM ;  Surgeon: [name], Peter, MD;  [facility];  Service: Open Heart Surgery;  Laterality: N/A;  . IR PERCUTANEOUS ART THROMBECTOMY/INFUSION INTRACRANIAL INC DIAG ANGIO  04/29/2017  . IR RADIOLOGIST EVAL & MGMT  05/17/2017  . IR US GUIDE VASC ACCESS LEFT  04/29/2017  . IR US GUIDE VASC ACCESS RIGHT  04/29/2017  . LEFT ATRIAL APPENDAGE OCCLUSION Left 01/23/2018   Procedure: LEFT ATRIAL APPENDAGE OCCLUSION USING ATRICURE ATRICLIP PRO2 LAA EXCLUSION SYSTEM  SIZE 40, LOT # [id number], CAT # [id number], EXP. DATE 2020-04-28;  Surgeon: Donata ClayVan Trigt, Theron AristaPeter, MD;  [facility];  Service: Open Heart Surgery;  Laterality: Left;  . MITRAL VALVE REPLACEMENT N/A 01/23/2018   Procedure: MITRAL VALVE (MV) REPLACEMENT USING MAGNA MITRAL EASE PERICARDIAL BIOPROSTHESIS, MODEL [id number], SIZE 29 MM, SERIAL # [id number], EXPIRATION  DATE 2021-03-03.;  Surgeon: [name], Peter, MD;  [facility];  Service: Open Heart Surgery;  Laterality: N/A;  . MULTIPLE EXTRACTIONS WITH ALVEOLOPLASTY N/A 12/26/2017   Procedure: Extraction of tooth #'s 4,6-11, 18 -27, 30, and 31 with alveoloplasty;  Surgeon: Charlynne PanderKulinski, Ronald F, DDS;  Location: [facility] OR;  Service: Oral Surgery;  Laterality: N/A;  . RADIOLOGY WITH ANESTHESIA N/A 04/29/2017   Procedure: IR WITH ANESTHESIA;  Surgeon: Radiologist, Medication, MD;  Location: [facility] OR;  Service: Radiology;  Laterality: N/A;  . RIGHT/LEFT HEART CATH AND CORONARY ANGIOGRAPHY N/A 08/18/2017   Procedure: RIGHT/LEFT HEART CATH AND CORONARY ANGIOGRAPHY;  Surgeon: Yvonne KendallEnd, Christopher, MD;  Location: [facility] INVASIVE CV LAB;  Service: Cardiovascular;  Laterality: N/A;  . TEE WITHOUT CARDIOVERSION N/A 09/22/2017   Procedure:  TRANSESOPHAGEAL ECHOCARDIOGRAM (TEE);  Surgeon: Lewayne Buntingrenshaw, Brian S, MD;  [facility];  Service: Cardiovascular;  Laterality: N/A;  . TEE WITHOUT CARDIOVERSION N/A 01/23/2018   Procedure: TRANSESOPHAGEAL ECHOCARDIOGRAM (TEE);  Surgeon: Donata ClayVan Trigt, Theron AristaPeter, MD;  [facility];  Service: Open Heart  Surgery;  Laterality: N/A;  . TONSILLECTOMY      Medication: Current Facility-Administered Medications  Medication Dose Route Frequency Provider Last Rate Last Dose  . acetaminophen (TYLENOL) tablet 325-650 mg  325-650 mg Oral Q4H PRN Jacquelynn CreeLove, Pamela S, PA-C   325 mg at 02/06/18 0618  . alum & mag hydroxide-simeth (MAALOX/MYLANTA) 200-200-20 MG/5ML suspension 30 mL  30 mL Oral Q4H PRN Love, Pamela S, PA-C      . aspirin EC tablet 81 mg  81 mg Oral Daily Jacquelynn CreeLove, Pamela S, PA-C   81 mg at 02/06/18 [id number]   Or  . aspirin chewable tablet 81 mg  81 mg Oral Daily Jacquelynn CreeLove, Pamela S, PA-C   81 mg at 02/04/18 [id number]  . bisacodyl (DULCOLAX) EC tablet 10 mg  10 mg Oral Daily Jacquelynn CreeLove, Pamela S, PA-C   10 mg at 02/06/18 [id number]   Or  . bisacodyl (DULCOLAX) suppository 10 mg  10 mg Rectal Daily Love, Pamela S, PA-C      . carvedilol (COREG) tablet 3.125 mg  3.125 mg Oral BID WC Marcello FennelPatel, Ankit Anil, MD   3.125 mg at 02/06/18 1034  . cephALEXin (KEFLEX) capsule 500 mg  500 mg Oral Q8H Doree FudgeZimmerman, Donielle M, PA-C   500 mg at 02/06/18 1454  . diphenhydrAMINE (BENADRYL) 12.5 MG/5ML elixir 12.5-25 mg  12.5-25 mg Oral Q6H PRN Love, Pamela S, PA-C      . feeding supplement (PRO-STAT SUGAR FREE 64) liquid 30 mL  30 mL Oral BID Delle ReiningLove, Pamela S, PA-C   30 mL at 02/06/18 0755  . guaiFENesin (MUCINEX) 12 hr tablet 1,200 mg  1,200 mg Oral BID Jacquelynn CreeLove, Pamela S, PA-C   1,200 mg at 02/06/18 0753  . guaiFENesin-dextromethorphan (ROBITUSSIN DM) 100-10 MG/5ML syrup 5-10 mL  5-10 mL Oral Q6H PRN Love, Pamela S, PA-C      . insulin aspart (novoLOG) injection 0-24 Units  0-24 Units Subcutaneous TID WC LoveEvlyn Kanner, Pamela S, PA-C   2 Units at 02/06/18 1227  . insulin detemir (LEVEMIR) injection 15 Units  15 Units Subcutaneous Daily Jacquelynn CreeLove, Pamela S, [place]   15 Units at 02/06/18 0755  . losartan (COZAAR) tablet 12.5 mg  12.5 mg Oral QHS Jacquelynn CreeLove, Pamela S, PA-C   12.5 mg at 02/05/18 2040  . ondansetron (ZOFRAN) tablet 4 mg  4 mg Oral Q6H PRN Love, Pamela S, PA-C        Or  . ondansetron (ZOFRAN) injection 4 mg  4 mg Intravenous Q6H PRN Love, Pamela S, PA-C      . oxyCODONE (Oxy IR/ROXICODONE) immediate release tablet 5 mg  5 mg Oral Q6H PRN Jacquelynn CreeLove, Pamela S, PA-C   5 mg at 02/06/18 0333  . pantoprazole (PROTONIX) EC tablet 40 mg  40 mg Oral Daily Jacquelynn CreeLove, Pamela S, PA-C   40 mg at 02/06/18 0753  . polyethylene glycol (MIRALAX / GLYCOLAX) packet 17 g  17 g Oral BID Jacquelynn CreeLove, Pamela S, PA-C   17 g at 02/06/18 0755  . protein supplement (PREMIER PROTEIN) liquid - approved for s/p bariatric surgery  11 oz Oral TID WC Love, Evlyn Kanneramela S, PA-C   11 oz at 02/06/18 1227  . rosuvastatin (CRESTOR) tablet 20 mg  20 mg  Oral q1800 Jacquelynn Cree, PA-C   20 mg at 02/05/18 1737  . spironolactone (ALDACTONE) tablet 12.5 mg  12.5 mg Oral Daily Jacquelynn Cree, PA-C   12.5 mg at 02/06/18 0753  . tamsulosin (FLOMAX) capsule 0.4 mg  0.4 mg Oral QHS Love, Pamela S, PA-C   0.4 mg at 02/05/18 2040  . traZODone (DESYREL) tablet 25-50 mg  25-50 mg Oral QHS PRN Jacquelynn Cree, PA-C   50 mg at 02/05/18 2039  . warfarin (COUMADIN) tablet 5 mg  5 mg Oral q1800 Jacquelynn Cree, PA-C   5 mg at 02/05/18 1737  . Warfarin - Physician Dosing Inpatient   Does not apply q1800 Love, Evlyn Kanner, PA-C        Allergies: Allergies  Allergen Reactions  . Fentanyl Shortness Of Breath  . Promethazine Hcl Anaphylaxis and Other (See Comments)    Cardiac arrest  . Foeniculum Vulgare Other (See Comments)    Fennel bulbs--nausea only  . Metformin And Related Diarrhea  . Penicillins Nausea Only    Has patient had a PCN reaction causing immediate rash, facial/tongue/throat swelling, SOB or lightheadedness with hypotension: no Has patient had a PCN reaction causing severe rash involving mucus membranes or skin necrosis: no Has patient had a PCN reaction that required hospitalization: no Has patient had a PCN reaction occurring within the last 10 years: yes If all of the above answers are "NO", then may proceed with  Cephalosporin use.   . Sulfa Antibiotics Other (See Comments)    G.IDorena Dew    Social History: Social History   Tobacco Use  . Smoking status: Former Smoker    Types: Cigarettes    Last attempt to quit: 1994    Years since quitting: 25.6  . Smokeless tobacco: Never Used  Substance Use Topics  . Alcohol use: Yes    Alcohol/week: 3.0 standard drinks    Types: 1 Glasses of wine, 1 Cans of beer, 1 Standard drinks or equivalent per week    Comment: mix drink  . Drug use: No    Family History Family History  Problem Relation Age of Onset  . Hypertension Father   . Diabetes Father     Review of Systems 10 systems were reviewed and are negative except as noted specifically in the HPI.  Objective   Vital signs in last 24 hours: BP (!) 99/56 (BP Location: Left Arm)   Pulse 82   Temp 98.9 F (37.2 C) (Oral)   Resp 18   Ht 5' 11.5" (1.816 m)   Wt 89.3 kg   SpO2 98%   BMI 27.08 kg/m   Physical Exam General: NAD, A&O, resting, appropriate HEENT: [place]/AT, EOMI, MMM Pulmonary: Normal work of breathing Cardiovascular: HDS, adequate peripheral perfusion Abdomen: Soft, NTTP, nondistended GU: No CVA tenderness. Foley catheter draining thin watermelon-color urine without clot. Extremities: warm and well perfused, ecchymoses along left thigh Neuro: Appropriate, no focal neurological deficits  Most Recent Labs: Lab Results  Component Value Date   WBC 16.0 (H) 02/06/2018   HGB 9.0 (L) 02/06/2018   HCT 26.2 (L) 02/06/2018   PLT 238 02/06/2018    Lab Results  Component Value Date   NA 130 (L) 02/06/2018   K 4.0 02/06/2018   CL 95 (L) 02/06/2018   CO2 25 02/06/2018   BUN 15 02/06/2018   CREATININE 0.80 02/06/2018   CALCIUM 8.4 (L) 02/06/2018   MG 2.4 01/24/2018    Lab Results  Component Value Date  INR 2.31 02/06/2018   APTT 38 (H) 01/23/2018     IMAGING: No results found.  ------  Assessment:  Patient is a 72 y.o. male with urinary retention requiring  Foley catheter placement who has recently developed hematuria.  The patient's urine is currently thin watermelon-color and he is hemodynamically stable. Additionally, hemoglobin is stable and he has not required any blood transfusions. He is on aspirin 81mg  and Coumadin.   We discussed possible etiologies of the patient's hematuria, including infection, BPH, kidney stones, cystitis, kidney cancer, and bladder cancer. He does have a history of kidney stones. UCx is growing Citrobacter koseri.  He is currently being treated with PO Keflex.  Recommendations: 1. No urgent urologic intervention is currently indicated given patient's stability. 2. Continue Flomax and plan for TOV after 2-3 weeks of bladder decompression with Foley catheter. This may be performed with his local urologist, Dr. Saddie Benders. 3. If the patient's hematuria becomes clinically significant, please notify the urology consult pager. 4. Please obtain contrasted CT A/P while admitted.  5. Patient will also need outpatient cystoscopy to complete his hematuria work up. He prefers to follow up for this as outpatient with his local urologist, Dr. Saddie Benders.  Thank you for this consult. Please contact the urology consult pager with any further questions/concerns.

## 2018-02-06 NOTE — Progress Notes (Addendum)
Advanced Heart Failure Rounding Note  PCP-Cardiologist: No primary care provider on file.   Subjective:    Feeling OK this am. Just tired. Denies SOB. No CP. Continues to complain of constipation. Regimen adjusted today.   Foley placed today to decompress bladder. + hematuria. Urology to be consulted per note.   Hgb 9.0. Plts 238. Cr stable at 0.80.  Objective:   Weight Range: 89.3 kg Body mass index is 27.08 kg/m.   Vital Signs:   Temp:  [97.8 F (36.6 C)-98.7 F (37.1 C)] 98.7 F (37.1 C) (08/12 0536) Pulse Rate:  [71-79] 77 (08/12 0754) Resp:  [17-20] 18 (08/12 0536) BP: (91-107)/(52-62) 91/61 (08/12 0754) SpO2:  [96 %-99 %] 96 % (08/12 0536) Last BM Date: 02/04/18  Weight change: Filed Weights   02/03/18 1830 02/04/18 1700  Weight: 89.8 kg 89.3 kg    Intake/Output:   Intake/Output Summary (Last 24 hours) at 02/06/2018 0902 Last data filed at 02/06/2018 0549 Gross per 24 hour  Intake 440 ml  Output 2500 ml  Net -2060 ml      Physical Exam    General: No resp difficulty HEENT: Normal Neck: Supple. JVP not elevated. Carotids 2+ bilat; no bruits. No lymphadenopathy or thyromegaly appreciated. Cor: PMI nondisplaced. Regular rate & rhythm. No rubs, gallops or murmurs. Lungs: Clear Abdomen: Soft, nontender, nondistended. No hepatosplenomegaly. No bruits or masses. Good bowel sounds. Extremities: No cyanosis, clubbing, rash, edema Neuro: Alert & orientedx3, cranial nerves grossly intact. moves all 4 extremities w/o difficulty. Affect pleasant  Telemetry   Not connected.  EKG    Afib 60s 02/03/18  Labs    CBC Recent Labs    02/04/18 0539 02/05/18 0534 02/06/18 0520  WBC 10.6* 11.1* 16.0*  NEUTROABS 8.2*  --   --   HGB 9.0* 8.7* 9.0*  HCT 27.7* 26.9* 26.2*  MCV 93.3 93.1 91.0  PLT 170 203 238   Basic Metabolic Panel Recent Labs    16/03/9607/10/19 0539 02/06/18 0520  NA 130* 130*  K 4.2 4.0  CL 95* 95*  CO2 26 25  GLUCOSE 123* 118*  BUN 20  15  CREATININE 1.11 0.80  CALCIUM 8.3* 8.4*   Liver Function Tests Recent Labs    02/04/18 0539  AST 19  ALT 18  ALKPHOS 86  BILITOT 1.0  PROT 5.7*  ALBUMIN 2.5*   No results for input(s): LIPASE, AMYLASE in the last 72 hours. Cardiac Enzymes No results for input(s): CKTOTAL, CKMB, CKMBINDEX, TROPONINI in the last 72 hours.  BNP: BNP (last 3 results) Recent Labs    05/18/17 1006  BNP 92.3    ProBNP (last 3 results) No results for input(s): PROBNP in the last 8760 hours.   D-Dimer No results for input(s): DDIMER in the last 72 hours. Hemoglobin A1C No results for input(s): HGBA1C in the last 72 hours. Fasting Lipid Panel No results for input(s): CHOL, HDL, LDLCALC, TRIG, CHOLHDL, LDLDIRECT in the last 72 hours. Thyroid Function Tests No results for input(s): TSH, T4TOTAL, T3FREE, THYROIDAB in the last 72 hours.  Invalid input(s): FREET3  Other results:   Imaging     No results found.   Medications:     Scheduled Medications: . aspirin EC  81 mg Oral Daily   Or  . aspirin  81 mg Oral Daily  . bisacodyl  10 mg Oral Daily   Or  . bisacodyl  10 mg Rectal Daily  . carvedilol  3.125 mg Oral BID WC  .  cephALEXin  500 mg Oral Q8H  . Chlorhexidine Gluconate Cloth  6 each Topical Daily  . feeding supplement (PRO-STAT SUGAR FREE 64)  30 mL Oral BID  . guaiFENesin  1,200 mg Oral BID  . insulin aspart  0-24 Units Subcutaneous TID WC  . insulin detemir  15 Units Subcutaneous Daily  . losartan  12.5 mg Oral QHS  . pantoprazole  40 mg Oral Daily  . polyethylene glycol  17 g Oral BID  . protein supplement shake  11 oz Oral TID WC  . rosuvastatin  20 mg Oral q1800  . spironolactone  12.5 mg Oral Daily  . tamsulosin  0.4 mg Oral QHS  . warfarin  5 mg Oral q1800  . Warfarin - Physician Dosing Inpatient   Does not apply q1800     Infusions:   PRN Medications:  acetaminophen, alum & mag hydroxide-simeth, diphenhydrAMINE, guaiFENesin-dextromethorphan,  ondansetron **OR** ondansetron (ZOFRAN) IV, oxyCODONE, traZODone    Patient Profile   Nathan D [name] is a 72 y.o. male with history of diabetes, embolic stroke, ICM, 3 vessel CAD, MVR admitted for scheduled CABG on 01/23/2018  Moved to CIR 02/03/18  Assessment/Plan   1. S/P CABGx4 And bioprosthetic mitral valve replacement - No s/s of ischemia.    - On statin - On coumadin.  2. Anemia-expected blood loss - Hgb 9.0 today. No bleeding.  3. Thrombocytopenia  - Platelets 238 today.  4. Chronic Systolic HF, ICM. -  Initially diagnosed 2014 and there was mention of viral. Possible NICM/ICM 08/2017 TEE EF 30-35%  01/2018 Post CABG ECHO 20-25% - Volume status stable on exam.  - Continue 12.5 mg spiro daily.  - Continue 12.5 mg losartan at bedtime.  - Coreg has been held. Parameters placed.   - Follow up renal function closely 5. H/O Embolic Stroke - On statin. - On coumadin. No bleeding.  6.PAF - Rate controlled. On coumadin. - EKG 02/03/18 Afib 60s.  - INR 2.31 this am. Dosing per Pharm D.   7. Diabetes.  - SSI 8. Deconditioning - Appreciate CIR.  9. Constipation - Continue regimen.  10. Hematuria - Foley in place. Urology consult pending per primary notes.   Medication concerns reviewed with patient and pharmacy team. Barriers identified: None at this time.   Length of Stay: 3  Luane SchoolMichael Andrew Tillery, PA-C  02/06/2018, 9:02 AM  Advanced Heart Failure Team Pager [phone] (M-F; 7a - 4p)  Please contact [facility] for night-coverage after hours (4p -7a ) and weekends on [email or web address]  Patient seen with PA, agree with the above note.  BP soft at times.  Should be ok to give Coreg as long as SBP 90 or above.  Will adjust hold parameters.   He was in atrial fibrillation on last ECG.  Will continue warfarin, plan DCCV after discharge from CIR most likely if remains in atrial fibrillation.  Will order ECG today.   Marca AnconaDalton Domenique Southers 02/06/2018 1:34 PM

## 2018-02-07 ENCOUNTER — Inpatient Hospital Stay (HOSPITAL_COMMUNITY): Payer: Self-pay

## 2018-02-07 ENCOUNTER — Inpatient Hospital Stay (HOSPITAL_COMMUNITY): Payer: Self-pay | Admitting: Physical Therapy

## 2018-02-07 ENCOUNTER — Inpatient Hospital Stay (HOSPITAL_COMMUNITY): Payer: Self-pay | Admitting: Occupational Therapy

## 2018-02-07 ENCOUNTER — Inpatient Hospital Stay (HOSPITAL_COMMUNITY): Payer: Medicare Other

## 2018-02-07 LAB — GLUCOSE, CAPILLARY
GLUCOSE-CAPILLARY: 134 mg/dL — AB (ref 70–99)
Glucose-Capillary: 91 mg/dL (ref 70–99)
Glucose-Capillary: 140 mg/dL — ABNORMAL HIGH (ref 70–99)
Glucose-Capillary: 113 mg/dL — ABNORMAL HIGH (ref 70–99)

## 2018-02-07 LAB — PROTIME-INR
INR: 2.85
Prothrombin Time: 29.7 seconds — ABNORMAL HIGH (ref 11.4–15.2)

## 2018-02-07 MED ORDER — IOHEXOL 300 MG/ML  SOLN
100.0000 mL | Freq: Once | INTRAMUSCULAR | Status: AC | PRN
  Administered 2018-02-07: 100 mL via INTRAVENOUS

## 2018-02-07 NOTE — Plan of Care (Signed)
  Problem: Consults Goal: RH GENERAL PATIENT EDUCATION Description See Patient Education module for education specifics. Outcome: Progressing Goal: Skin Care Protocol Initiated - if Braden Score 18 or less Description If consults are not indicated, leave blank or document N/A Outcome: Progressing Goal: Diabetes Guidelines if Diabetic/Glucose > 140 Description If diabetic or lab glucose is > 140 mg/dl - Initiate Diabetes/Hyperglycemia Guidelines & Document Interventions  Outcome: Progressing   Problem: RH BOWEL ELIMINATION Goal: RH STG MANAGE BOWEL WITH ASSISTANCE Description STG Manage Bowel with min Assistance.  Outcome: Progressing Goal: RH STG MANAGE BOWEL W/MEDICATION W/ASSISTANCE Description STG Manage Bowel with Medication with min Assistance.  Outcome: Progressing   Problem: RH BLADDER ELIMINATION Goal: RH STG MANAGE BLADDER WITH ASSISTANCE Description STG Manage Bladder With mod Assistance  Outcome: Progressing Goal: RH STG MANAGE BLADDER WITH MEDICATION WITH ASSISTANCE Description STG Manage Bladder With Medication With min Assistance.  Outcome: Progressing Goal: RH STG MANAGE BLADDER WITH EQUIPMENT WITH ASSISTANCE Description STG Manage Bladder With Equipment With total Assistance  Outcome: Progressing   Problem: RH SKIN INTEGRITY Goal: RH STG SKIN FREE OF INFECTION/BREAKDOWN Description Patients skin will remain free from further infection or breakdown with mod assist.  Outcome: Progressing Goal: RH STG MAINTAIN SKIN INTEGRITY WITH ASSISTANCE Description STG Maintain Skin Integrity With mod Assistance.  Outcome: Progressing Goal: RH STG ABLE TO PERFORM INCISION/WOUND CARE W/ASSISTANCE Description STG Able To Perform Incision/Wound Care With mod/max Assistance.  Outcome: Progressing   Problem: RH SAFETY Goal: RH STG ADHERE TO SAFETY PRECAUTIONS W/ASSISTANCE/DEVICE Description STG Adhere to Safety Precautions With min Assistance/Device.  Outcome:  Progressing   Problem: RH PAIN MANAGEMENT Goal: RH STG PAIN MANAGED AT OR BELOW PT'S PAIN GOAL Description < 4  Outcome: Progressing

## 2018-02-07 NOTE — Progress Notes (Signed)
Occupational Therapy Session Note  Patient Details  Name: Nathan [name] D Molesky Jr. MRN: [id number] Date of Birth: 11-07-1945  Today's Date: 02/07/2018 OT Individual Time: 0845-1000 OT Individual Time Calculation (min): 75 min    Short Term Goals: Week 1:  OT Short Term Goal 1 (Week 1): Pt will complete toilet transfer with mod A OT Short Term Goal 2 (Week 1): Pt will complete LB dressing with mod A  OT Short Term Goal 3 (Week 1): Pt will follow sternal precautions with min verbal cues within BADL session.   Skilled Therapeutic Interventions/Progress Updates:    Pt seen for OT ADL bathing/dressing session. Pt sitting up in bed upon arrival, agreeable to tx session though voicing fatigue from procedure in middle of night. Pt willing to participate as able. He transferred to EOB with significantly increased time, and cuing for attention to task and sequencing of transfer in order to maintain sternal pre-cautions during bed mobility with assist to bring trunk upright.  He completed mod stand pivot transfer to w/c using RW, VCs for sequencing and attention to L LE. Completed bathing/dressing from w/c level at sink. Required significantly increased time and rest breaks to complete task. He was able to cross LEs into figure four position in order to wash B LEs, thread pants and don socks. He stood at sink x2 trials to complete pericare/buttock hygiene and then second trial to pull pants up. Pt required mod-max A to stand from w/c, able to maintain dynamic standing balance without UE support with CGA.  Following rest break, completed grooming tasks from w/c level. Pt able to apply shaving cream, therapist shaved for safety.  Completed stand pivot transfer to toilet with mod A, used grab bar once in standing to assist with balance, demonstrating improved functional standing balance. Pt returned to w/c and left seated in w/c at end of session, chair belt on and all needs in reach. Throughout session, pt  requires redirection to task due to decreased attenion, easily distracted by internal stimuli and hyperverbal. When attempting to re-direct to task, pt reports this is his baseline personality, "always having a thousand thoughts going through my head'. Pt also requiring constant feedback and positive reinforcement on performance.   Therapy Documentation Precautions:  Precautions Precautions: Fall, Sternal Restrictions Weight Bearing Restrictions: Yes Other Position/Activity Restrictions: sternal precautions ADL: ADL ADL Comments: Please see functional navigator  See Function Navigator for Current Functional Status.   Therapy/Group: Individual Therapy  Kasondra Junod L 02/07/2018, 6:56 AM

## 2018-02-07 NOTE — Progress Notes (Signed)
Physical Therapy Session Note  Patient Details  Name: Nathan [name] D Schweiss Jr. MRN: [id number] Date of Birth: Oct 31, 1945  Today's Date: 02/07/2018 PT Individual Time: 1345-1430 PT Individual Time Calculation (min): 45 min   Short Term Goals: Week 1:  PT Short Term Goal 1 (Week 1): Pt will complete bed mobility with min A with good adherence to sternal precautions PT Short Term Goal 2 (Week 1): Pt will complete transfer with LRAD and mod A consistently PT Short Term Goal 3 (Week 1): Pt will initiate gait training  Skilled Therapeutic Interventions/Progress Updates:    Pt supine in bed upon PT arrival, agreeable to therapy tx and reports back pain 8/10. Pt transferred from supine>sitting EOB with mod assist, verbal cues for techniques and sternal precautions. Pt performed sit<>stand from EOB with mod assist and performed stand pivot with RW and min assist. Pt transported from room>gym. Pt performed sit<>stand from w/c with mod assist, verbal cues for sternal precautions and stand pivot with RW and min assist. Pt performed 2 x 5 sit<>stands from elevated mat with UEs crossed over chest, therapist providing manual facilitation for L LE placement and weightbearing, min assist. Pt worked on standing balance/standing tolerance while tossing horseshoes x 2 trials. Pt performed stand pivot back to w/c, mod assist going towards the L with RW. Pt transported back to room and left seated with needs in reach and chair alarm set.   Therapy Documentation Precautions:  Precautions Precautions: Fall, Sternal Restrictions Weight Bearing Restrictions: Yes Other Position/Activity Restrictions: sternal precautions   See Function Navigator for Current Functional Status.   Therapy/Group: Individual Therapy  Cresenciano GenreEmily van Schagen, PT, DPT 02/07/2018, 10:52 AM

## 2018-02-07 NOTE — Progress Notes (Signed)
Patient ID: Nathan Philipsandleman D Angello Jr., male   DOB: 1946/04/05, 72 y.o.   MRN: [id number]  CT showed no stones or other findings of immediate concern.  There is a small right renal cyst and prostate enlargement.   He will just need to continue the keflex for his UTI and mainatin foley drainage until follow up with Dr. Saddie Bendershao, [place] in [place], in 1-2 weeks for a voiding trial.

## 2018-02-07 NOTE — Evaluation (Signed)
Recreational Therapy Assessment and Plan  Patient Details  Name: Nathan [name]. MRN: [id number] Date of Birth: [birth year] Today's Date: 02/07/2018  Rehab Potential: Good  ELOS: 2 weeks Pain:  No c/o Assessment Problem List:      Patient Active Problem List   Diagnosis Date Noted  . Debility 02/03/2018  . 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 Pressure injury of skin 02/02/2018  . Malnutrition of moderate degree 02/01/2018  . Coronary artery disease involving native coronary artery of native heart with angina pectoris ([place])   . S/P CABG (coronary artery bypass graft)   . Atrial fibrillation ([place])   . Chronic combined systolic and diastolic CHF (congestive heart failure) ([place])   . Diabetes mellitus type 2 in nonobese (HCC)   . Essential hypertension   . History of CVA (cerebrovascular accident)   . Acute blood loss anemia   . Hyponatremia   . S/P left atrial appendage ligation 01/26/2018  . S/P CABG x 4 01/23/2018  . S/P MVR (mitral valve replacement) 01/23/2018  . Mitral valve insufficiency   . On amiodarone therapy 07/28/2017  . CAD in native artery 07/28/2017  . Chronic anticoagulation 05/18/2017  . LBBB (left bundle branch block) 05/18/2017  . Hospital discharge follow-up 05/09/2017  . Paroxysmal atrial fibrillation ([place]) 05/01/2017  . Dilated cardiomyopathy ([employer]) 05/01/2017  . DM (diabetes mellitus) type 2, uncontrolled, with ketoacidosis ([place]) 05/01/2017  . Hypotension 05/01/2017  . HLD (hyperlipidemia) 05/01/2017  . Cerebrovascular accident (CVA) due to embolism of right middle cerebral artery ([place]) 04/29/2017  . Hypertensive heart disease with heart failure ([place]) 11/26/2016  . Mixed dyslipidemia 11/26/2016  . Chronic systolic congestive heart failure ([place]) 04/24/2015  . IVCD (intraventricular conduction defect) 04/24/2015    Past Medical History:  Past Medical History:  Diagnosis Date  . Atrial  fibrillation with RVR ([place]) 05/01/2017  . Cardiomyopathy ([place]) 05/01/2017  . CHF (congestive heart failure) ([place])   . Chronic combined systolic and diastolic heart failure ([place]) 04/24/2015  . Coronary artery disease   . DM (diabetes mellitus) type 2, uncontrolled, with ketoacidosis ([place]) 05/01/2017  . Essential hypertension 11/26/2016  . History of kidney stones   . HLD (hyperlipidemia) 05/01/2017  . IVCD (intraventricular conduction defect) 04/24/2015  . Mitral valve regurgitation   . Mixed dyslipidemia 11/26/2016  . Stroke (cerebrum) ([place]) 04/29/2017   Past Surgical History:       Past Surgical History:  Procedure Laterality Date  . BRAIN SURGERY    . CARDIAC CATHETERIZATION    . CORONARY ARTERY BYPASS GRAFT N/A 01/23/2018   Procedure: CORONARY ARTERY BYPASS GRAFTING (CABG) x 4 WITH ENDOSCOPIC HARVESTING OF RIGHT GREATER SAPHENOUS VEIN: LIMA TO LAD, SVG TO RCA, SVG TO DIAG, SVG TO OM ;  Surgeon: Ivin Poot, MD;  Location: [facility];  Service: Open Heart Surgery;  Laterality: N/A;  . IR PERCUTANEOUS ART THROMBECTOMY/INFUSION INTRACRANIAL INC DIAG ANGIO  04/29/2017  . IR RADIOLOGIST EVAL & MGMT  05/17/2017  . IR US GUIDE VASC ACCESS LEFT  04/29/2017  . IR US GUIDE VASC ACCESS RIGHT  04/29/2017  . LEFT ATRIAL APPENDAGE OCCLUSION Left 01/23/2018   Procedure: LEFT ATRIAL APPENDAGE OCCLUSION USING ATRICURE ATRICLIP PRO2 LAA EXCLUSION SYSTEM  SIZE 40, LOT # [id number], CAT # [id number], EXP. DATE 2020-04-28;  Surgeon: Prescott Gum, Collier Salina, MD;  Location: [facility];  Service: Open Heart Surgery;  Laterality: Left;  . MITRAL VALVE REPLACEMENT N/A 01/23/2018   Procedure: MITRAL  VALVE (MV) REPLACEMENT USING MAGNA MITRAL EASE PERICARDIAL BIOPROSTHESIS, MODEL [id number], SIZE 29 MM, SERIAL # [id number], EXPIRATION  DATE 2021-03-03.;  Surgeon: Ivin Poot, MD;  Location: [facility];  Service: Open Heart Surgery;  Laterality: N/A;  . MULTIPLE EXTRACTIONS WITH ALVEOLOPLASTY N/A 12/26/2017   Procedure: Extraction of  tooth #'s 4,6-11, 18 -27, 30, and 31 with alveoloplasty;  Surgeon: Lenn Cal, DDS;  Location: [facility];  Service: Oral Surgery;  Laterality: N/A;  . RADIOLOGY WITH ANESTHESIA N/A 04/29/2017   Procedure: IR WITH ANESTHESIA;  Surgeon: Radiologist, Medication, MD;  Location: [facility];  Service: Radiology;  Laterality: N/A;  . RIGHT/LEFT HEART CATH AND CORONARY ANGIOGRAPHY N/A 08/18/2017   Procedure: RIGHT/LEFT HEART CATH AND CORONARY ANGIOGRAPHY;  Surgeon: Nelva Bush, MD;  Location: [facility];  Service: Cardiovascular;  Laterality: N/A;  . TEE WITHOUT CARDIOVERSION N/A 09/22/2017   Procedure: TRANSESOPHAGEAL ECHOCARDIOGRAM (TEE);  Surgeon: Lelon Perla, MD;  Location[facility];  Service: Cardiovascular;  Laterality: N/A;  . TEE WITHOUT CARDIOVERSION N/A 01/23/2018   Procedure: TRANSESOPHAGEAL ECHOCARDIOGRAM (TEE);  Surgeon: Prescott Gum, Collier Salina, MD;  Location: [facility];  Service: Open Heart Surgery;  Laterality: N/A;  . TONSILLECTOMY      Assessment & Plan Clinical Impression: Patient is a 72 y.o. 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 Prescott Gum. Post op course significant for ABLA, thrombocytopenia, fluid overload, SOB, hypotension as well as development of MS changed with recurrent left sided weakness on 08/02. CT head done revealing interval development of hypoattenuation in right frontal region felt to be extension of prior R-MCA stroke. On IV heparin to coumadin and continues to have intermittent A fib. Cardiology feels that patient may require cardioversion in the distant future and coreg added today. Patient transferred to CIR on 02/03/2018.    Pt presents with decreased activity tolerance, decreased functional mobility, decreased balance, decreased coordination, decreased  problem solving, decreased safety awareness and decreased memory, difficulty maintaining precautions Limiting pt's independence with leisure/community pursuits.  Plan Min 1 TR session >20 minutes during LOS Recommendations for other services: Neuropsych  Discharge Criteria: Patient will be discharged from TR if patient refuses treatment 3 consecutive times without medical reason.  If treatment goals not met, if there is a change in medical status, if patient makes no progress towards goals or if patient is discharged from hospital.  The above assessment, treatment plan, treatment alternatives and goals were discussed and mutually agreed upon: by patient  [place] 02/07/2018, 2:58 PM

## 2018-02-07 NOTE — Progress Notes (Signed)
Subjective/Complaints: Appreciate urology consult, as well as cardiology f/u  ROS  Neg CP, SOB, N/V/D Objective: Vital Signs: Blood pressure 105/67, pulse 75, temperature (!) 97.3 F (36.3 C), temperature source Oral, resp. rate 18, height 5' 11.5" (1.816 m), weight 87 kg, SpO2 98 %. Ct Abdomen Pelvis W Contrast  Result Date: 02/07/2018 CLINICAL DATA:  Gross hematuria. Patient is postoperative coronary bypass on 07/29. A Foley catheter was placed for urinary retention. Positive urine cultures and on antibiotics. EXAM: CT ABDOMEN AND PELVIS WITH CONTRAST TECHNIQUE: Multidetector CT imaging of the abdomen and pelvis was performed using the standard protocol following bolus administration of intravenous contrast. CONTRAST:  117m OMNIPAQUE IOHEXOL 300 MG/ML  SOLN COMPARISON:  CT abdomen and pelvis 10/15/2016 FINDINGS: Lower chest: Small bilateral pleural effusions. Infiltrates in the lung bases could be due to edema, atelectasis, or pneumonia. Cardiac enlargement. Postoperative changes in the mediastinum. Hepatobiliary: No focal liver abnormality is seen. No gallstones, gallbladder wall thickening, or biliary dilatation. Pancreas: Unremarkable. No pancreatic ductal dilatation or surrounding inflammatory changes. Spleen: Normal in size without focal abnormality. Adrenals/Urinary Tract: No adrenal gland nodules. Renal nephrograms are symmetrical. Small bilateral renal cysts. No hydronephrosis or hydroureter. Bladder is decompressed with a Foley catheter in place. Bladder wall is thickened suggesting under distention versus cystitis changes. Air in the bladder likely results from catheterization. No focal lesion identified. Decompression of the bladder limits evaluation. Stomach/Bowel: Stomach, small bowel, and colon are not abnormally distended. Contrast material flows through to the rectum suggesting no evidence of obstruction. No wall thickening or inflammatory changes are appreciated. The appendix is  normal. Vascular/Lymphatic: Aortic atherosclerosis. No enlarged abdominal or pelvic lymph nodes. Reproductive: Prostate gland is diffusely enlarged, measuring 6.2 cm in diameter. Other: No free air or free fluid in the abdomen. Abdominal wall musculature appears intact. Musculoskeletal: No acute or significant osseous findings. IMPRESSION: 1. Small bilateral pleural effusions with basilar atelectasis, edema, or pneumonia. 2. Bladder wall thickening suggesting under distention versus cystitis. Bladder is decompressed with Foley catheter in place. 3. Prostate gland is diffusely enlarged Electronically Signed   By: WLucienne CapersM.D.   On: 02/07/2018 04:44   Results for orders placed or performed during the hospital encounter of 02/03/18 (from the past 72 hour(s))  Glucose, capillary     Status: Abnormal   Collection Time: 02/04/18 11:47 AM  Result Value Ref Range   Glucose-Capillary 144 (H) 70 - 99 mg/dL  Glucose, capillary     Status: Abnormal   Collection Time: 02/04/18  4:49 PM  Result Value Ref Range   Glucose-Capillary 146 (H) 70 - 99 mg/dL  Glucose, capillary     Status: Abnormal   Collection Time: 02/04/18  9:27 PM  Result Value Ref Range   Glucose-Capillary 123 (H) 70 - 99 mg/dL   Comment 1 Notify RN   Protime-INR     Status: Abnormal   Collection Time: 02/05/18  5:34 AM  Result Value Ref Range   Prothrombin Time 25.5 (H) 11.4 - 15.2 seconds   INR 2.34     Comment: Performed at [facility] [address] [id number] CBC     Status: Abnormal   Collection Time: 02/05/18  5:34 AM  Result Value Ref Range   WBC 11.1 (H) 4.0 - 10.5 K/uL   RBC 2.89 (L) 4.22 - 5.81 MIL/uL   Hemoglobin 8.7 (L) 13.0 - 17.0 g/dL   HCT 26.9 (L) 39.0 - 52.0 %   MCV 93.1 78.0 -  100.0 fL   MCH 30.1 26.0 - 34.0 pg   MCHC 32.3 30.0 - 36.0 g/dL   RDW 13.1 11.5 - 15.5 %   Platelets 203 150 - 400 K/uL    Comment: Performed at [facility], [place] [address], [place], [place]  [address]  Glucose, capillary     Status: Abnormal   Collection Time: 02/05/18  6:19 AM  Result Value Ref Range   Glucose-Capillary 104 (H) 70 - 99 mg/dL   Comment 1 Notify RN   Glucose, capillary     Status: Abnormal   Collection Time: 02/05/18 11:45 AM  Result Value Ref Range   Glucose-Capillary 121 (H) 70 - 99 mg/dL  Glucose, capillary     Status: Abnormal   Collection Time: 02/05/18  4:27 PM  Result Value Ref Range   Glucose-Capillary 128 (H) 70 - 99 mg/dL  Glucose, capillary     Status: Abnormal   Collection Time: 02/05/18  9:19 PM  Result Value Ref Range   Glucose-Capillary 223 (H) 70 - 99 mg/dL   Comment 1 Notify RN   Basic metabolic panel     Status: Abnormal   Collection Time: 02/06/18  5:20 AM  Result Value Ref Range   Sodium 130 (L) 135 - 145 mmol/L   Potassium 4.0 3.5 - 5.1 mmol/L   Chloride 95 (L) 98 - 111 mmol/L   CO2 25 22 - 32 mmol/L   Glucose, Bld 118 (H) 70 - 99 mg/dL   BUN 15 8 - 23 mg/dL   Creatinine, Ser 0.80 0.61 - 1.24 mg/dL   Calcium 8.4 (L) 8.9 - 10.3 mg/dL   GFR calc non Af Amer >60 >60 mL/min   GFR calc Af Amer >60 >60 mL/min    Comment: (NOTE) The eGFR has been calculated using the CKD EPI equation. This calculation has not been validated in all clinical situations. eGFR's persistently <60 mL/min signify possible Chronic Kidney Disease.    Anion gap 10 5 - 15    Comment: Performed at [facility] [address], [place], [place] [address]  CBC     Status: Abnormal   Collection Time: 02/06/18  5:20 AM  Result Value Ref Range   WBC 16.0 (H) 4.0 - 10.5 K/uL   RBC 2.88 (L) 4.22 - 5.81 MIL/uL   Hemoglobin 9.0 (L) 13.0 - 17.0 g/dL   HCT 26.2 (L) 39.0 - 52.0 %   MCV 91.0 78.0 - 100.0 fL   MCH 31.3 26.0 - 34.0 pg   MCHC 34.4 30.0 - 36.0 g/dL   RDW 13.0 11.5 - 15.5 %   Platelets 238 150 - 400 K/uL    Comment: Performed at [facility], [place] [name] [place],  [address]  Protime-INR     Status: Abnormal   Collection Time:  02/06/18  5:20 AM  Result Value Ref Range   Prothrombin Time 25.2 (H) 11.4 - 15.2 seconds   INR 2.31     Comment: Performed at [facility] [address], [place], [place] [address]  Glucose, capillary     Status: Abnormal   Collection Time: 02/06/18  7:12 AM  Result Value Ref Range   Glucose-Capillary 136 (H) 70 - 99 mg/dL   Comment 1 Notify RN   Glucose, capillary     Status: Abnormal   Collection Time: 02/06/18 11:21 AM  Result Value Ref Range   Glucose-Capillary 135 (H) 70 - 99 mg/dL  Glucose, capillary  Status: Abnormal   Collection Time: 02/06/18  4:26 PM  Result Value Ref Range   Glucose-Capillary 142 (H) 70 - 99 mg/dL  Glucose, capillary     Status: Abnormal   Collection Time: 02/06/18  9:15 PM  Result Value Ref Range   Glucose-Capillary 124 (H) 70 - 99 mg/dL  Protime-INR     Status: Abnormal   Collection Time: 02/07/18  4:29 AM  Result Value Ref Range   Prothrombin Time 29.7 (H) 11.4 - 15.2 seconds   INR 2.85     Comment: Performed at [facility], [place] [address] [place], [place] [address]  Glucose, capillary     Status: None   Collection Time: 02/07/18  7:09 AM  Result Value Ref Range   Glucose-Capillary 91 70 - 99 mg/dL   Comment 1 Notify RN      HEENT: edendulous Cardio: RRR and no murmur Resp: CTA B/L and unlabored GI: BS positive and Non tender Extremity:  No Edema Skin:   Intact and Wound C/D/I and sternotomy and R SVG site Neuro: Alert/Oriented, Normal Sensory, Abnormal Motor 4/5 LUE and LLE, 5/5 on Right side and Abnormal FMC Ataxic/ dec FMC Musc/Skel:  Other No pain with ROM Gen NAD   Assessment/Plan: 1. Functional deficits secondary to RIght MCA infarct  which require 3+ hours per day of interdisciplinary therapy in a comprehensive inpatient rehab setting. Physiatrist is providing close team supervision and 24 hour management of active medical problems listed below. Physiatrist and rehab team continue to assess barriers to  discharge/monitor patient progress toward functional and medical goals. FIM: Function - Bathing Position: Wheelchair/chair at sink Body parts bathed by patient: Right arm, Left arm, Chest, Abdomen Body parts bathed by helper: Back Bathing not applicable: Front perineal area, Buttocks, Right upper leg, Left upper leg, Right lower leg, Left lower leg(Declined LB bathing) Assist Level: Touching or steadying assistance(Pt > 75%)  Function- Upper Body Dressing/Undressing What is the patient wearing?: Pull over shirt/dress Pull over shirt/dress - Perfomed by patient: Thread/unthread right sleeve, Thread/unthread left sleeve, Pull shirt over trunk, Put head through opening Pull over shirt/dress - Perfomed by helper: Put head through opening, Pull shirt over trunk Assist Level: Supervision or verbal cues Function - Lower Body Dressing/Undressing What is the patient wearing?: Pants, Non-skid slipper socks Position: Bed Pants- Performed by helper: Thread/unthread right pants leg, Thread/unthread left pants leg, Pull pants up/down Non-skid slipper socks- Performed by helper: Don/doff right sock, Don/doff left sock Assist for footwear: Dependant Assist for lower body dressing: Touching or steadying assistance (Pt > 75%)  Function - Toileting Toileting activity did not occur: No continent bowel/bladder event Toileting steps completed by helper: Adjust clothing prior to toileting, Performs perineal hygiene, Adjust clothing after toileting(per [facility], NT report) Toileting Assistive Devices: Other (comment)(foley cath) Assist level: Two helpers(per [facility] [place], NT report)  Function - Toilet Transfers Toilet transfer activity did not occur: Refused Assist level to toilet: 2 helpers(per [place], NT report)  Function - Chair/bed transfer Chair/bed transfer method: Stand pivot Chair/bed transfer assist level: Moderate assist (Pt 50 - 74%/lift or lower) Chair/bed transfer  details: Verbal cues for sequencing, Verbal cues for technique, Verbal cues for precautions/safety, Manual facilitation for weight shifting, Manual facilitation for placement  Function - Locomotion: Wheelchair Will patient use wheelchair at discharge?: No Function - Locomotion: Ambulation Ambulation activity did not occur: Safety/medical concerns Assistive device: Walker-rolling Max distance: 12 Assist level: 2 helpers Assist level: 2 helpers  Function - Comprehension Comprehension: Auditory Comprehension assist level: Follows basic conversation/direction with no assist  Function - Expression Expression: Verbal Expression assist level: Expresses basic needs/ideas: With no assist  Function - Social Interaction Social Interaction assist level: Interacts appropriately 75 - 89% of the time - Needs redirection for appropriate language or to initiate interaction.  Function - Problem Solving Problem solving assist level: Solves basic 25 - 49% of the time - needs direction more than half the time to initiate, plan or complete simple activities, Solves basic 50 - 74% of the time/requires cueing 25 - 49% of the time  Function - Memory Memory assist level: Recognizes or recalls 50 - 74% of the time/requires cueing 25 - 49% of the time Patient normally able to recall (first 3 days only): Current season, Location of own room, Staff names and faces, That he or she is in a hospital    Medical Problem List and Plan: 1.Debility and right hemiparesissecondary to Right MCA and deconditioning after CABG/MVR -CIR PT, OT 2. DVT Prophylaxis/Anticoagulation: Pharmaceutical:Coumadin 3. Pain Management:prn tylenol. Oxycodone or tramadol for more severe pain 4. Mood:LCSW to follow for evaluation and support. 5. Neuropsych: This patientis not fullycapable of making decisions on hisown behalf. 6. Skin/Wound Care:incisions look good Add protein supplements between meals 7.  Fluids/Electrolytes/Nutrition:Strict I/O. Intake poor due to recent teeth extraction. Check lytes in am.  8. CAD s/p CABG with MVR: Monitor for symptoms with activity. ON coumadin and Lipitor--coreg added  9.PAF: In A fib today but rate controlled on warfarin--coreg added today.  10. R-MCA stroke with extension: Left sided weakness resolving. On coumadin.  11. Chronic systolic CHF: Pulmonary edema improving --encourage IS. Monitor weights daily with strict I/O. On Spironolactone, losartan, coreg and Lipitor.  12. H/o BPH/Acute Urinary retention: Question of delirium--will check UA/UCS. Foleywasplaced today to help decompress bladder. Continue foleyfor48 hours-hematuria persists will ask uro for rec, no clots  13. Constipation: Willincrease mirlax to bid--suppository today. and furtheraugment bowel programaslikely contributing to retention.  14. ABLA/ Continue to monitor H/H for recovery.  15. Hyponatremia: Continue to monitor for now.  16. T2DM: Monitor BS ac/hs. On levemir daily with SSI for tighter BS control. 17.  Leukocytosis likely due to cystits, citrobacter sens to Keflex LOS (Days) 4 A FACE TO FACE EVALUATION WAS PERFORMED  Charlett Blake 02/07/2018, 8:55 AM

## 2018-02-07 NOTE — Progress Notes (Signed)
Physical Therapy Session Note  Patient Details  Name: Nathan PhilipsRandleman D Oquin Jr. MRN: [id number] Date of Birth: [birth year]  Today's Date: 02/07/2018 PT Individual Time: 1100-1200 PT Individual Time Calculation (min): 60 min   Short Term Goals: Week 1:  PT Short Term Goal 1 (Week 1): Pt will complete bed mobility with min A with good adherence to sternal precautions PT Short Term Goal 2 (Week 1): Pt will complete transfer with LRAD and mod A consistently PT Short Term Goal 3 (Week 1): Pt will initiate gait training  Skilled Therapeutic Interventions/Progress Updates: Pt received seated in w/c, denies pain but reports significant fatigue from earlier sessions and poor sleep overnight d/t late CT. Transported totalA to gym via w/c. Squat pivot modA to mat table; requires several scoots and poor initiation with lack of anterior weight shift. Sit <>stand x3 reps from mat table no UE support, minA and table slightly elevated to increase ease of stand. Gait 2 x10', modA for LLE progression d/t impulsivity and leaving LLE behind him after small L step especially when fatigued and preparing for a turn. Stand pivot transfer to return to bed with modA and RW; mod cues for L side stepping [facility] for positioning. Sit >supine minA for LLE management. Remained seated in bed, alarm intact and all needs in reach.      Therapy Documentation Precautions:  Precautions Precautions: Fall, Sternal Restrictions Weight Bearing Restrictions: Yes Other Position/Activity Restrictions: sternal precautions  See Function Navigator for Current Functional Status.   Therapy/Group: Individual Therapy  [name] 02/07/2018, 12:01 PM

## 2018-02-07 NOTE — Progress Notes (Signed)
Speech Language Pathology Daily Session Note  Patient Details  Name: Nathan PhilipsRandleman D Spranger Jr. MRN: [id number] Date of Birth: June 17, 1946  Today's Date: 02/07/2018 SLP Individual Time: 1430-1510 / 1030/1100 SLP Individual Time Calculation (min): 40 min and 30 min  Short Term Goals: Week 1: SLP Short Term Goal 1 (Week 1): Patient will demonstrate basic problem solving for functional and familiar tasks with Min A verbal cues.  SLP Short Term Goal 2 (Week 1): Patient will demonstrate recall of daily information with Min A verbal cues for use of compoensatory strategies.  SLP Short Term Goal 3 (Week 1): Patient will demonstrate selective attention in a mildly distracting enviornment for ~30 minutes with supervision verbal cues.  SLP Short Term Goal 4 (Week 1): Patient will self-monitor and correct errors during functional tasks with Min A verbal cues.   Skilled Therapeutic Interventions: 1# Skilled ST services focused on cognitive skills. SLP facilitated recall strategies implementing memory notebook, pt was unable write legibly due to left hand weakness, SLP recorded for pt, pt required min A verbal cues to recall today's events. SLP facilitated problem solving and error awareness skills utilizing money management, pt demonstrated Mod I for basic problem solving and required supervision A question cues for semi-complex problem solving/error correction. Pt demonstrated dramatic cognitive improvement compared to yesterday's therapy session, suggesting impacted by behavior/fatigue. Pt was left in room with call bell within reach and bed alaram set.ST reccomends to continue skilled ST services.  2# Skilled ST services focused on cognitive skills. SLP facilitated semi-complex problem solving and error awareness skills utilizing novel card game, Blink, pt required initial mod A verbal cues for problem solving fading to supervision A question cues. SLP facilitated recall of today's events pt required min A verbal  cues to utilize [employer]  Notebook. [facility]LP [facility] pt followed directions with supervision A verbal cues.  Pt was left in room with call bell within reach and bed alaram set.ST reccomends to continue skilled ST services.     Function:  Eating Eating                 Cognition Comprehension Comprehension assist level: Follows basic conversation/direction with no assist  Expression   Expression assist level: Expresses complex 90% of the time/cues < 10% of the time  Social Interaction Social Interaction assist level: Interacts appropriately 90% of the time - Needs monitoring or encouragement for participation or interaction.  Problem Solving Problem solving assist level: Solves basic problems with no assist;Solves complex 90% of the time/cues < 10% of the time  Memory Memory assist level: Recognizes or recalls 75 - 89% of the time/requires cueing 10 - 24% of the time    Pain Pain Assessment Pain Score: 0-No pain  Therapy/Group: Individual Therapy  Rajah Tagliaferro  [facility] 02/07/2018, 3:15 PM

## 2018-02-08 ENCOUNTER — Inpatient Hospital Stay (HOSPITAL_COMMUNITY): Payer: Medicare Other | Admitting: *Deleted

## 2018-02-08 ENCOUNTER — Encounter (HOSPITAL_COMMUNITY): Payer: Self-pay | Admitting: Psychology

## 2018-02-08 ENCOUNTER — Inpatient Hospital Stay (HOSPITAL_COMMUNITY): Payer: Self-pay

## 2018-02-08 ENCOUNTER — Inpatient Hospital Stay (HOSPITAL_COMMUNITY): Payer: Self-pay | Admitting: Occupational Therapy

## 2018-02-08 ENCOUNTER — Inpatient Hospital Stay (HOSPITAL_COMMUNITY): Payer: Medicare Other | Admitting: Occupational Therapy

## 2018-02-08 ENCOUNTER — Inpatient Hospital Stay (HOSPITAL_COMMUNITY): Payer: Self-pay | Admitting: Physical Therapy

## 2018-02-08 LAB — GLUCOSE, CAPILLARY
GLUCOSE-CAPILLARY: 169 mg/dL — AB (ref 70–99)
GLUCOSE-CAPILLARY: 153 mg/dL — AB (ref 70–99)
Glucose-Capillary: 117 mg/dL — ABNORMAL HIGH (ref 70–99)
Glucose-Capillary: 156 mg/dL — ABNORMAL HIGH (ref 70–99)

## 2018-02-08 LAB — PROTIME-INR
INR: 3.05
Prothrombin Time: 31.3 seconds — ABNORMAL HIGH (ref 11.4–15.2)

## 2018-02-08 MED ORDER — LIDOCAINE HCL URETHRAL/MUCOSAL 2 % EX GEL
1.0000 "application " | Freq: Once | CUTANEOUS | Status: DC
  Filled 2018-02-08: qty 5

## 2018-02-08 MED ORDER — MAGNESIUM CITRATE PO SOLN
1.0000 | Freq: Once | ORAL | Status: AC
  Administered 2018-02-08: 1 via ORAL
  Filled 2018-02-08: qty 296

## 2018-02-08 NOTE — Progress Notes (Signed)
Physical Therapy Session Note  Patient Details  Name: Nathan PhilipsRandleman D Yeates Jr. MRN: [id number] Date of Birth: Sep 09, 1945  Today's Date: 02/08/2018 PT Individual Time: 4098-11911030-1115 PT Individual Time Calculation (min): 45 min   Short Term Goals: Week 1:  PT Short Term Goal 1 (Week 1): Pt will complete bed mobility with min A with good adherence to sternal precautions PT Short Term Goal 2 (Week 1): Pt will complete transfer with LRAD and mod A consistently PT Short Term Goal 3 (Week 1): Pt will initiate gait training  Skilled Therapeutic Interventions/Progress Updates: Pt received seated in w/c, denies pain and agreeable to treatment. Reports "bad night" but excited that catheter is less uncomfortable than it was. Transported to gym totalA. Stand pivot minA to mat table with RW. Standing RLE toe taps to 3" step x10 reps for forced use and weight bearing LLE with minA and cues for righting reactions to reduce leaning on mat table. One set 10 alternating toe taps for focus on R weight shift to improve L foot clearance during gait. Gait x15' with RW and min guard>minA as pt fatigued with increased need to assistance with progressing L foot. Second gait trial x20'; improved upright posture and LLE progression with tactile cueing. Returned to room totalA. Stand pivot minA to bed with RW, minA sit >supine. Remained seated in bed, alarm intact and all needs in reach.      Therapy Documentation Precautions:  Precautions Precautions: Fall, Sternal Restrictions Weight Bearing Restrictions: Yes Other Position/Activity Restrictions: sternal precautions Pain: Pain Assessment Pain Scale: 0-10 Pain Score: 0-No pain   See Function Navigator for Current Functional Status.   Therapy/Group: Individual Therapy  [name] 02/08/2018, 11:17 AM

## 2018-02-08 NOTE — Progress Notes (Signed)
Subjective/Complaints: Completed OT, needs activities written in memory notebook so he can remember ROS  Neg CP, SOB, N/V/D Objective: Vital Signs: Blood pressure (!) 92/32, pulse 66, temperature 99 F (37.2 C), temperature source Oral, resp. rate 18, height 5' 11.5" (1.816 m), weight 87.5 kg, SpO2 96 %. Ct Abdomen Pelvis W Contrast  Result Date: 02/07/2018 CLINICAL DATA:  Gross hematuria. Patient is postoperative coronary bypass on 07/29. A Foley catheter was placed for urinary retention. Positive urine cultures and on antibiotics. EXAM: CT ABDOMEN AND PELVIS WITH CONTRAST TECHNIQUE: Multidetector CT imaging of the abdomen and pelvis was performed using the standard protocol following bolus administration of intravenous contrast. CONTRAST:  144m OMNIPAQUE IOHEXOL 300 MG/ML  SOLN COMPARISON:  CT abdomen and pelvis 10/15/2016 FINDINGS: Lower chest: Small bilateral pleural effusions. Infiltrates in the lung bases could be due to edema, atelectasis, or pneumonia. Cardiac enlargement. Postoperative changes in the mediastinum. Hepatobiliary: No focal liver abnormality is seen. No gallstones, gallbladder wall thickening, or biliary dilatation. Pancreas: Unremarkable. No pancreatic ductal dilatation or surrounding inflammatory changes. Spleen: Normal in size without focal abnormality. Adrenals/Urinary Tract: No adrenal gland nodules. Renal nephrograms are symmetrical. Small bilateral renal cysts. No hydronephrosis or hydroureter. Bladder is decompressed with a Foley catheter in place. Bladder wall is thickened suggesting under distention versus cystitis changes. Air in the bladder likely results from catheterization. No focal lesion identified. Decompression of the bladder limits evaluation. Stomach/Bowel: Stomach, small bowel, and colon are not abnormally distended. Contrast material flows through to the rectum suggesting no evidence of obstruction. No wall thickening or inflammatory changes are appreciated.  The appendix is normal. Vascular/Lymphatic: Aortic atherosclerosis. No enlarged abdominal or pelvic lymph nodes. Reproductive: Prostate gland is diffusely enlarged, measuring 6.2 cm in diameter. Other: No free air or free fluid in the abdomen. Abdominal wall musculature appears intact. Musculoskeletal: No acute or significant osseous findings. IMPRESSION: 1. Small bilateral pleural effusions with basilar atelectasis, edema, or pneumonia. 2. Bladder wall thickening suggesting under distention versus cystitis. Bladder is decompressed with Foley catheter in place. 3. Prostate gland is diffusely enlarged Electronically Signed   By: WLucienne CapersM.D.   On: 02/07/2018 04:44   Results for orders placed or performed during the hospital encounter of 02/03/18 (from the past 72 hour(s))  Glucose, capillary     Status: Abnormal   Collection Time: 02/05/18 11:45 AM  Result Value Ref Range   Glucose-Capillary 121 (H) 70 - 99 mg/dL  Glucose, capillary     Status: Abnormal   Collection Time: 02/05/18  4:27 PM  Result Value Ref Range   Glucose-Capillary 128 (H) 70 - 99 mg/dL  Glucose, capillary     Status: Abnormal   Collection Time: 02/05/18  9:19 PM  Result Value Ref Range   Glucose-Capillary 223 (H) 70 - 99 mg/dL   Comment 1 Notify RN   Basic metabolic panel     Status: Abnormal   Collection Time: 02/06/18  5:20 AM  Result Value Ref Range   Sodium 130 (L) 135 - 145 mmol/L   Potassium 4.0 3.5 - 5.1 mmol/L   Chloride 95 (L) 98 - 111 mmol/L   CO2 25 22 - 32 mmol/L   Glucose, Bld 118 (H) 70 - 99 mg/dL   BUN 15 8 - 23 mg/dL   Creatinine, Ser 0.80 0.61 - 1.24 mg/dL   Calcium 8.4 (L) 8.9 - 10.3 mg/dL   GFR calc non Af Amer >60 >60 mL/min   GFR calc Af Amer >60 >60 mL/min  Comment: (NOTE) The eGFR has been calculated using the CKD EPI equation. This calculation has not been validated in all clinical situations. eGFR's persistently <60 mL/min signify possible Chronic Kidney Disease.    Anion gap 10  5 - 15    Comment: Performed at [facility] [address], [place], [place] [address]  CBC     Status: Abnormal   Collection Time: 02/06/18  5:20 AM  Result Value Ref Range   WBC 16.0 (H) 4.0 - 10.5 K/uL   RBC 2.88 (L) 4.22 - 5.81 MIL/uL   Hemoglobin 9.0 (L) 13.0 - 17.0 g/dL   HCT 26.2 (L) 39.0 - 52.0 %   MCV 91.0 78.0 - 100.0 fL   MCH 31.3 26.0 - 34.0 pg   MCHC 34.4 30.0 - 36.0 g/dL   RDW 13.0 11.5 - 15.5 %   Platelets 238 150 - 400 K/uL    Comment: Performed at [facility], [place] [address], [place], [place] [address]  Protime-INR     Status: Abnormal   Collection Time: 02/06/18  5:20 AM  Result Value Ref Range   Prothrombin Time 25.2 (H) 11.4 - 15.2 seconds   INR 2.31     Comment: Performed at [facility] [address] [place], [place] [address]  Glucose, capillary     Status: Abnormal   Collection Time: 02/06/18  7:12 AM  Result Value Ref Range   Glucose-Capillary 136 (H) 70 - 99 mg/dL   Comment 1 Notify RN   Glucose, capillary     Status: Abnormal   Collection Time: 02/06/18 11:21 AM  Result Value Ref Range   Glucose-Capillary 135 (H) 70 - 99 mg/dL  Glucose, capillary     Status: Abnormal   Collection Time: 02/06/18  4:26 PM  Result Value Ref Range   Glucose-Capillary 142 (H) 70 - 99 mg/dL  Glucose, capillary     Status: Abnormal   Collection Time: 02/06/18  9:15 PM  Result Value Ref Range   Glucose-Capillary 124 (H) 70 - 99 mg/dL  Protime-INR     Status: Abnormal   Collection Time: 02/07/18  4:29 AM  Result Value Ref Range   Prothrombin Time 29.7 (H) 11.4 - 15.2 seconds   INR 2.85     Comment: Performed at [facility], [place] [address] [place], [place] [address]  Glucose, capillary     Status: None   Collection Time: 02/07/18  7:09 AM  Result Value Ref Range   Glucose-Capillary 91 70 - 99 mg/dL   Comment 1 Notify RN   Glucose, capillary     Status: Abnormal   Collection Time: 02/07/18 12:09 PM  Result Value Ref Range    Glucose-Capillary 140 (H) 70 - 99 mg/dL  Glucose, capillary     Status: Abnormal   Collection Time: 02/07/18  4:38 PM  Result Value Ref Range   Glucose-Capillary 113 (H) 70 - 99 mg/dL  Glucose, capillary     Status: Abnormal   Collection Time: 02/07/18  8:49 PM  Result Value Ref Range   Glucose-Capillary 134 (H) 70 - 99 mg/dL  Protime-INR     Status: Abnormal   Collection Time: 02/08/18  6:06 AM  Result Value Ref Range   Prothrombin Time 31.3 (H) 11.4 - 15.2 seconds   INR 3.05     Comment: Performed at [facility], [place] [address] [place], [place] [address]  Glucose, capillary     Status: Abnormal  Collection Time: 02/08/18  6:36 AM  Result Value Ref Range   Glucose-Capillary 117 (H) 70 - 99 mg/dL     HEENT: edendulous Cardio: RRR and no murmur Resp: CTA B/L and unlabored GI: BS positive and Non tender Extremity:  No Edema Skin:   Intact and Wound C/D/I and sternotomy and R SVG site Neuro: Alert/Oriented, Normal Sensory, Abnormal Motor 4/5 LUE and LLE, 5/5 on Right side and Abnormal FMC Ataxic/ dec FMC Musc/Skel:  Other No pain with ROM Gen NAD   Assessment/Plan: 1. Functional deficits secondary to RIght MCA infarct  which require 3+ hours per day of interdisciplinary therapy in a comprehensive inpatient rehab setting. Physiatrist is providing close team supervision and 24 hour management of active medical problems listed below. Physiatrist and rehab team continue to assess barriers to discharge/monitor patient progress toward functional and medical goals. FIM: Function - Bathing Position: Wheelchair/chair at sink Body parts bathed by patient: Right arm, Left arm, Chest, Abdomen, Front perineal area, Buttocks, Right upper leg, Left upper leg, Right lower leg, Left lower leg Body parts bathed by helper: Back Bathing not applicable: Front perineal area, Buttocks, Right upper leg, Left upper leg, Right lower leg, Left lower leg(Declined LB bathing) Assist Level:  Touching or steadying assistance(Pt > 75%)  Function- Upper Body Dressing/Undressing What is the patient wearing?: Pull over shirt/dress Pull over shirt/dress - Perfomed by patient: Thread/unthread right sleeve, Thread/unthread left sleeve, Pull shirt over trunk, Put head through opening Pull over shirt/dress - Perfomed by helper: Put head through opening, Pull shirt over trunk Assist Level: Supervision or verbal cues Function - Lower Body Dressing/Undressing What is the patient wearing?: Pants, Non-skid slipper socks, Ted Hose Position: Wheelchair/chair at [employer]- Performed by patient: Thread/unthread right pants leg, Thread/unthread left pants leg, Pull pants up/down Pants- Performed by helper: Thread/unthread right pants leg, Thread/unthread left pants leg, Pull pants up/down Non-skid slipper socks- Performed by patient: Don/doff right sock, Don/doff left sock Non-skid slipper socks- Performed by helper: Don/doff right sock, Don/doff left sock TED Hose - Performed by helper: Don/doff right TED hose, Don/doff left TED hose Assist for footwear: Supervision/touching assist Assist for lower body dressing: Touching or steadying assistance (Pt > 75%)  Function - Toileting Toileting activity did not occur: No continent bowel/bladder event Toileting steps completed by helper: Adjust clothing prior to toileting, Performs perineal hygiene, Adjust clothing after toileting(per Haskell Flirt Aquit, NT report) Toileting Assistive Devices: Other (comment)(foley cath) Assist level: Two helpers(per Ivy Lyndee Leo Aquit, NT report)  Function - Toilet Transfers Toilet transfer activity did not occur: Refused Toilet transfer assistive device: Elevated toilet seat/BSC over toilet, Grab bar Assist level to toilet: Moderate assist (Pt 50 - 74%/lift or lower) Assist level from toilet: Moderate assist (Pt 50 - 74%/lift or lower)  Function - Chair/bed transfer Chair/bed transfer method: Stand pivot Chair/bed  transfer assist level: Moderate assist (Pt 50 - 74%/lift or lower) Chair/bed transfer assistive device: Walker Chair/bed transfer details: Verbal cues for sequencing, Verbal cues for technique, Verbal cues for precautions/safety, Manual facilitation for weight shifting, Manual facilitation for placement  Function - Locomotion: Wheelchair Will patient use wheelchair at discharge?: No Function - Locomotion: Ambulation Ambulation activity did not occur: Safety/medical concerns Assistive device: Walker-rolling Max distance: 10 Assist level: Moderate assist (Pt 50 - 74%) Assist level: Moderate assist (Pt 50 - 74%)  Function - Comprehension Comprehension: Auditory Comprehension assist level: Follows basic conversation/direction with no assist  Function - Expression Expression: Verbal Expression assist level: Expresses complex 90%  of the time/cues < 10% of the time  Function - Social Interaction Social Interaction assist level: Interacts appropriately 90% of the time - Needs monitoring or encouragement for participation or interaction.  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 DVT Prophylaxis/Anticoagulation: Pharmaceutical:Coumadin per pharm protocol 3. Pain Management:prn tylenol. Oxycodone or tramadol for more severe pain 4. Mood:LCSW to follow for evaluation and support. 5. Neuropsych: This patientis not fullycapable of making decisions on hisown  behalf. 6. Skin/Wound Care:incisions look good Add protein supplements between meals 7. Fluids/Electrolytes/Nutrition:Strict I/O. Intake poor due to recent teeth extraction. Check lytes in am.  8. CAD s/p CABG with MVR: Monitor for symptoms with activity. ON coumadin and Lipitor--coreg added  9.PAF: In A fib today but rate controlled on warfarin--coreg added today.  10. R-MCA stroke with extension: Left sided weakness resolving. On coumadin.  11. Chronic systolic CHF: Pulmonary edema improving --encourage IS. Monitor weights daily with strict I/O. On Spironolactone, losartan, coreg and Lipitor.  12. H/o BPH/Acute Urinary retention: Question of delirium--will check UA/UCS. Foleywasplaced today to help decompress bladder. Continue foleyfor48 hours-hematuria persists will ask uro for rec, no clots  13. Constipation: Willincrease mirlax to bid--suppository today. and furtheraugment bowel programaslikely contributing to retention.  14. ABLA/ Continue to monitor H/H for recovery.   15. Hyponatremia: Continue to monitor for now.  16. T2DM: Monitor BS ac/hs. On levemir daily with SSI for tighter BS control. 17.  Leukocytosis likely due to cystits, citrobacter sens to Keflex- will recheck in am LOS (Days) 5 A FACE TO FACE EVALUATION WAS PERFORMED  Charlett Blake 02/08/2018, 8:42 AM

## 2018-02-08 NOTE — Progress Notes (Signed)
Patient moved to 10-MW with all his personal belongings.  Lorri FrederickMartha E Shevelle Smither, LPN

## 2018-02-08 NOTE — Progress Notes (Signed)
Occupational Therapy Session Note  Patient Details  Name: Nathan PhilipsRandleman D Lanum Jr. MRN: [id number] Date of Birth: 09-May-1946  Today's Date: 02/08/2018 OT Individual Time: [id number] OT Individual Time Calculation (min): 75 min    Short Term Goals: Week 1:  OT Short Term Goal 1 (Week 1): Pt will complete toilet transfer with mod A OT Short Term Goal 2 (Week 1): Pt will complete LB dressing with mod A  OT Short Term Goal 3 (Week 1): Pt will follow sternal precautions with min verbal cues within BADL session.   Skilled Therapeutic Interventions/Progress Updates:    Pt seen for OT session focusing on ADL re-training and attention to task. Pt asleep in supine upon arrival, easily awoken and agreeable to tx session. He transferred to EOB with cuing for sequencing and maintaining of sternal pre-cautions as well as bringing trunk upright.  He ate breakfast seated EOB, able to complete all set-up and self feed using L UE. Pt perseverative on events of the night including catheter pain etc. Requiring frequent cuing and redirection to task. Following ~20 minutes of sitting EOB, pt voicing increased fatigue from unsupported sitting and requiring encouragement to cont on with therapy session.  Following extended seated rest break, pt completed min A stand pivot transfer with RW from slightly elevated EOB and VCs for sequencing of transfer. Completed bathing/dressing from w/c level at sink, cont to require VCs for redirection to task as pt hyperverbal and easily distracted by internal stimuli.  He completed x3 sit>stand at sink duirng LB bathing/dressing routine and required mod A to stand from w/c. Pt tolerating ~30 seconds in standing each trial before requiring seated rest break. Pt able to maintain standing balance, alternating UE support on sink ledge while completing buttock hygiene and able to let go with B UEs in order to pull pants up, however, followed by qucik sit due to fatigue. Pt left seated in w/c  at end of session with chair belt on and all needs in reach.    Therapy Documentation Precautions:  Precautions Precautions: Fall, Sternal Restrictions Weight Bearing Restrictions: Yes Other Position/Activity Restrictions: sternal precautions Pain:   No/denies pain ADL: ADL ADL Comments: Please see functional navigator  See Function Navigator for Current Functional Status.   Therapy/Group: Individual Therapy  [name] 02/08/2018, 7:07 AM

## 2018-02-08 NOTE — Progress Notes (Signed)
Occupational Therapy Note  Patient Details  Name: Nathan PhilipsRandleman D Wordell Jr. MRN: [id number] Date of Birth: Nov 02, 1945  Today's Date: 02/08/2018 OT Missed Time: 45 Minutes Missed Time Reason: Patient fatigue  Upon entering the room, pt in bed with RN present. Pt refusing OT intervention secondary to fatigue. RN placed suppository prior to OT arrival as well.    Alen BleacherBradsher, Berdie Malter P 02/08/2018, 3:37 PM

## 2018-02-08 NOTE — Patient Care Conference (Signed)
Inpatient RehabilitationTeam Conference and Plan of Care Update Date: 02/08/2018   Time: 10:20 AM    Patient Name: Nathan PhilipsRandleman D Ulloa Jr.      Medical Record Number: [id number]  Date of Birth: [birth year] Sex: Male         Room/Bed: 4M10C/4M10C-01 Payor Info: Payor: [employer] MEDICARE / Plan: [employer] MEDICARE / Product Type: *No Product type* /    Admitting Diagnosis: Debility  Admit Date/Time:  02/03/2018  6:17 PM Admission Comments: No comment available   Primary Diagnosis:  <principal problem not specified> Principal Problem: <principal problem not specified>  Patient Active Problem List   Diagnosis Date Noted  . Debility 02/03/2018  . 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 Pressure injury of skin 02/02/2018  . Malnutrition of moderate degree 02/01/2018  . Coronary artery disease involving native coronary artery of native heart with angina pectoris (HCC)   . S/P CABG (coronary artery bypass graft)   . Atrial fibrillation (HCC)   . Chronic combined systolic and diastolic CHF (congestive heart failure) (HCC)   . Diabetes mellitus type 2 in nonobese (HCC)   . Essential hypertension   . History of CVA (cerebrovascular accident)   . Acute blood loss anemia   . Hyponatremia   . S/P left atrial appendage ligation 01/26/2018  . S/P CABG x 4 01/23/2018  . S/P MVR (mitral valve replacement) 01/23/2018  . Mitral valve insufficiency   . On amiodarone therapy 07/28/2017  . CAD in native artery 07/28/2017  . Chronic anticoagulation 05/18/2017  . LBBB (left bundle branch block) 05/18/2017  . Hospital discharge follow-up 05/09/2017  . Paroxysmal atrial fibrillation (HCC) 05/01/2017  . Dilated cardiomyopathy (HCC) 05/01/2017  . DM (diabetes mellitus) type 2, uncontrolled, with ketoacidosis (HCC) 05/01/2017  . Hypotension 05/01/2017  . HLD (hyperlipidemia) 05/01/2017  . Cerebrovascular accident (CVA) due to embolism of right middle cerebral  artery (HCC) 04/29/2017  . Hypertensive heart disease with heart failure (HCC) 11/26/2016  . Mixed dyslipidemia 11/26/2016  . Chronic systolic congestive heart failure (HCC) 04/24/2015  . IVCD (intraventricular conduction defect) 04/24/2015    Expected Discharge Date: Expected Discharge Date: 02/23/18  Team Members Present: Physician leading conference: Dr. Claudette LawsAndrew Kirsteins Social Worker Present: Dossie DerBecky Delynn Olvera, LCSW Nurse Present: Other (comment)(Kayla Mabe-LPN) PT Present: Alyson ReedyElizabeth Tygielski, PT OT Present: Johnsie CancelAmy Lewis, OT SLP Present: Colin BentonMadison Cratch, SLP PPS Coordinator present : Tora DuckMarie Noel, RN, CRRN     Current Status/Progress Goal Weekly Team Focus  Medical   Mild hematuria, blood pressures fluctuating on the low side.  Hemoglobin is stable  Maintain medical stability  Adjust blood pressure medications as well as address diabetes management   Bowel/Bladder   Foley catheter for urine retention, hematuria, incontinent of bowel; LBM 08/10. Patient received miralax 02/07/18 and will receive dulcolax 02/08/18  Become continent of bladder and remain continent of bowel with min assist.   Assess bowel and bladder needs q shift and PRN   Swallow/Nutrition/ Hydration             ADL's   Mod A functional transfers; min A UB/LB bathing/dressing; mod cuing for attention to task. Requires frequent rest breaks during all functional tasks  Supervision overall  functional transfers, L neuro re-ed and attention, ADL re-training, functional standing balance and endurance   Mobility   modA bed mobility, modA transfers, gait up to 10' non-functional  minA overall including gait x50'  LE strengthening, activity tolerance, dynamic balance, L NMR  Communication             Safety/Cognition/ Behavioral Observations  Mod-Min A  Supervision A  semi-complex, recall with external aid, error awareness, selective attention    Pain   Complaints of pain to right shoulder and lower abdomen/pelvic area at  8/10; treated with oxycodone and Tylenol  Pain </= 3/10  Assess pain q shift and PRN and treat as needed   Skin   Ecchymosis to right inner thigh/groin area, sternal incision; no signs of infection or breakdown  Remain free of infection and breakdown  Assess skin q shift and PRN       *See Care Plan and progress notes for long and short-term goals.     Barriers to Discharge  Current Status/Progress Possible Resolutions Date Resolved   Physician    Neurogenic Bowel & Bladder;Medical stability     Progressing towards goals  Continue rehab, outpatient follow-up with urology, will need indwelling Foley for now      Nursing                  PT                    OT                  SLP                SW                Discharge Planning/Teaching Needs:  Home with wife who is a Engineer, siteschool teacher and son who is not employed. Son will need to be able to provide the care for him to go home      Team Discussion:  Goals supervision-min assist level. Endurance and fatigue issues. Urology following for his bladder issues and will follow up with as an OP. Using memory book here for recall and memory issues-used one prior to admission.  Cardiology following. Pain issues MD addressing. Sternal precautions. Needs cues and to be re-directed. Will need family education with son prior to discharge home.  Revisions to Treatment Plan:  DC 8/29    Continued Need for Acute Rehabilitation Level of Care: The patient requires daily medical management by a physician with specialized training in physical medicine and rehabilitation for the following conditions: Daily direction of a multidisciplinary physical rehabilitation program to ensure safe treatment while eliciting the highest outcome that is of practical value to the patient.: Yes Daily medical management of patient stability for increased activity during participation in an intensive rehabilitation regime.: Yes Daily analysis of laboratory values and/or  radiology reports with any subsequent need for medication adjustment of medical intervention for : Urological problems;Neurological problems  Lucy ChrisDupree, Amel Gianino G 02/09/2018, 8:13 AM

## 2018-02-08 NOTE — Progress Notes (Signed)
Urology Progress Note      Subjective: Patient with no complaints Urine remains a thin watermelon-color without clot UOP adequate via Foley  Objective: Vital signs in last 24 hours: Temp:  [97.5 F (36.4 C)-99 F (37.2 C)] 97.5 F (36.4 C) (08/14 1340) Pulse Rate:  [65-67] 65 (08/14 1340) Resp:  [16-18] 18 (08/14 0916) BP: (85-121)/(32-69) 107/69 (08/14 1340) SpO2:  [96 %-100 %] 100 % (08/14 1340) Weight:  [87.5 kg] 87.5 kg (08/14 0519)  Intake/Output from previous day: 08/13 0701 - 08/14 0700 In: 600 [P.O.:600] Out: 250 [Urine:250] Intake/Output this shift: Total I/O In: 120 [P.O.:120] Out: 1175 [Urine:1175]  Physical Exam:  General: Alert and oriented CV: RRR Lungs: Clear Abdomen: Grossly non-distended GU: Foley in place draining thin watermelon color urine without clot Ext: NT, No erythema  Lab Results: Recent Labs    02/06/18 0520  HGB 9.0*  HCT 26.2*   BMET Recent Labs    02/06/18 0520  NA 130*  K 4.0  CL 95*  CO2 25  GLUCOSE 118*  BUN 15  CREATININE 0.80  CALCIUM 8.4*     Studies/Results: Ct Abdomen Pelvis W Contrast  Result Date: 02/07/2018 CLINICAL DATA:  Gross hematuria. Patient is postoperative coronary bypass on 07/29. A Foley catheter was placed for urinary retention. Positive urine cultures and on antibiotics. EXAM: CT ABDOMEN AND PELVIS WITH CONTRAST TECHNIQUE: Multidetector CT imaging of the abdomen and pelvis was performed using the standard protocol following bolus administration of intravenous contrast. CONTRAST:  100mL OMNIPAQUE IOHEXOL 300 MG/ML  SOLN COMPARISON:  CT abdomen and pelvis 10/15/2016 FINDINGS: Lower chest: Small bilateral pleural effusions. Infiltrates in the lung bases could be due to edema, atelectasis, or pneumonia. Cardiac enlargement. Postoperative changes in the mediastinum. Hepatobiliary: No focal liver abnormality is seen. No gallstones, gallbladder wall thickening, or biliary dilatation. Pancreas: Unremarkable.  No pancreatic ductal dilatation or surrounding inflammatory changes. Spleen: Normal in size without focal abnormality. Adrenals/Urinary Tract: No adrenal gland nodules. Renal nephrograms are symmetrical. Small bilateral renal cysts. No hydronephrosis or hydroureter. Bladder is decompressed with a Foley catheter in place. Bladder wall is thickened suggesting under distention versus cystitis changes. Air in the bladder likely results from catheterization. No focal lesion identified. Decompression of the bladder limits evaluation. Stomach/Bowel: Stomach, small bowel, and colon are not abnormally distended. Contrast material flows through to the rectum suggesting no evidence of obstruction. No wall thickening or inflammatory changes are appreciated. The appendix is normal. Vascular/Lymphatic: Aortic atherosclerosis. No enlarged abdominal or pelvic lymph nodes. Reproductive: Prostate gland is diffusely enlarged, measuring 6.2 cm in diameter. Other: No free air or free fluid in the abdomen. Abdominal wall musculature appears intact. Musculoskeletal: No acute or significant osseous findings. IMPRESSION: 1. Small bilateral pleural effusions with basilar atelectasis, edema, or pneumonia. 2. Bladder wall thickening suggesting under distention versus cystitis. Bladder is decompressed with Foley catheter in place. 3. Prostate gland is diffusely enlarged Electronically Signed   By: Burman NievesWilliam  Stevens M.D.   On: 02/07/2018 04:44    Assessment/Plan:  72 y.o. male with urinary retention requiring Foley catheter placement who has recently developed hematuria.  The patient's urine remains a thin watermelon-color and he is hemodynamically stable. He is on aspirin 81mg  and Coumadin.   We discussed possible etiologies of the patient's hematuria, including infection, BPH, kidney stones, cystitis, kidney cancer, and bladder cancer. He does have a history of kidney stones. His CT shows no evidence of stone or malignancy. UCx is  growing Citrobacter koseri.  He  is currently being treated with PO Keflex.  1. Continue Flomax and plan for TOV after 1-2 weeks of bladder decompression with Foley catheter. This may be performed with his local urologist, Dr. Saddie Bendershao. Patient will also need outpatient cystoscopy to complete his hematuria work up. He prefers to follow up for this as outpatient with Dr. Saddie Bendershao. 2. If the patient's hematuria becomes clinically significant, please notify the urology consult pager.    LOS: 5 days   Remas Sobel Geri SeminoleM Wilbert Hayashi 02/08/2018, 5:09 PM

## 2018-02-08 NOTE — Progress Notes (Signed)
Social Work Patient ID: Nathan D Tedrick Jr., male   DOB: 03/27/1946, 71 y.o.   MRN: [id number]  Met with pt and wife via telephone to discuss team conference goals supervision-min assist and target discharge date 8/29. Both are pleased with his progress and aware son will need to come in for education prior to discharge home, since he will be the one with him while wife is working. Will work toward discharge needs and pt is feeling better regarding his bladder issues.  

## 2018-02-08 NOTE — Consult Note (Signed)
Neuropsychological Consultation   Patient:   Nathan PhilipsRandleman D Camp Jr.   DOB:   12/10/1945  MR Number:  [id number]  [facility] [address] [id number] [facility] [place] [id number] Dept: [phone] Loc: [phone]           Date of Service:   02/08/2018  Start Time:   2 PM End Time:   3 PM  Provider/Observer:  Arley PhenixJohn Ashtin Melichar, Psy.D.       Clinical Neuropsychologist       Billing Code/Service: [id number] 4 Units  Chief Complaint:    Nathan Cashandleman Kalp Jr. 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  Admitted on 01/23/2018 for CABG x4 with bioprosthetic mitral valve replacement.  Post op course significant for ABLA, thrombocytopenia, fluid overload, SOB, hypotension as well as MS changes.  Left sided weakness on 8/2.  CT head done revealing interval development of hypo-attenuation in right frontal region felt to be extension of prior R-MCA stroke.  Patient has had times of confusion during this time.  The patient the patient had some lingering mild memory impairments prior to these medical issues from stroke in 2018.  Reason for Service:  Nathan [name] was referred for neuropsychological consultation due to adjustment and coping issues as well as prior confusion and memory loss.  Below is the HPI for the current admission.  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 Donata ClayVan Trigt. Post op course significant for ABLA, thrombocytopenia, fluid overload, SOB, hypotension as well as development of MS changed with recurrent left sided weakness on 08/02. CT head done  revealing interval development of hypoattenuation in right frontal region felt to be extension of prior R-MCA stroke. On IV heparin to coumadin and continues to have intermittent A fib. Cardiology feels that patient may require cardioversion in the distant future and coreg added today. He has had bouts of confusion and was found to be retaining urine with I/O cath for [phone] cc in the past 24 hours. He continues to have poor po intake and constipation--no BM since admission. Therapy ongoing and patient noted to be debilitated. CIR recommended due to functional deficits.   Current Status:  The patient reported and appeared to have improving mental status and cognition.  However, while he was able to recall complex issues with prior and current medical issues and physicians he has worked with, he did display elevated mood and some mild indications of grandiosity.  Unclear if this is consistent with premorbid function or not.  Mood was very positive but constant references to God/Jesus and treating physicians.  Unclear if this is just his personality style or new symptoms.    Behavioral Observation: Nathan D [name]  presents as a 72 y.o.-year-old Left Caucasian Male who appeared his stated age. his dress was Appropriate and he was Well Groomed and his manners were Appropriate to the situation.  his participation was indicative of Monopolizing and Redirectable behaviors.  There were any physical disabilities noted.  he displayed an appropriate level of cooperation and motivation.     Interactions:    Active Monopolizing and Redirectable  Attention:  abnormal and attention span appeared shorter than expected for age  Memory:   abnormal; mild memory issues but very good recall for medical history.  Visuo-spatial:  not examined  Speech (Volume):  normal  Speech:   normal; normal  Thought Process:  Coherent and Tangential  Though Content:  WNL; not suicidal and not  homicidal  Orientation:   person, place, time/date and situation  Judgment:   Fair  Planning:   Fair  Affect:    Excited  Mood:    Euphoric and Euthymic  Insight:   Fair  Intelligence:   normal   Medical History:   Past Medical History:  Diagnosis Date  . Atrial fibrillation with RVR (HCC) 05/01/2017  . Cardiomyopathy (HCC) 05/01/2017  . CHF (congestive heart failure) (HCC)   . 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 Coronary artery disease   . DM (diabetes mellitus) type 2, uncontrolled, with ketoacidosis (HCC) 05/01/2017  . Essential hypertension 11/26/2016  . History of kidney stones   . HLD (hyperlipidemia) 05/01/2017  . IVCD (intraventricular conduction defect) 04/24/2015  . Mitral valve regurgitation   . Mixed dyslipidemia 11/26/2016  . Stroke (cerebrum) (HCC) 04/29/2017   Psychiatric History:  No prior psychiatric history.  Family Med/Psych History:  Family History  Problem Relation Age of Onset  . Hypertension Father   . Diabetes Father     Risk of Suicide/Violence: virtually non-existent Patient denies SI or HI  Impression/DX:  Nathan [name] 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  Admitted on 01/23/2018 for CABG x4 with bioprosthetic mitral valve replacement.  Post op course significant for ABLA, thrombocytopenia, fluid overload, SOB, hypotension as well as MS changes.  Left sided weakness on 8/2.  CT head done revealing interval development of hypo-attenuation in right frontal region felt to be extension of prior R-MCA stroke.  Patient has had times of confusion during this time.  The patient the patient had some lingering mild memory impairments prior to these medical issues from stroke in 2018.  The patient reported and appeared to have improving mental status and cognition.  However, while he was able  to recall complex issues with prior and current medical issues and physicians he has worked with, he did display elevated mood and some mild indications of grandiosity.  Unclear if this is consistent with premorbid function or not.  Mood was very positive but constant references to God/Jesus and treating physicians.  Unclear if this is just his personality style or new symptoms.          Electronically Signed   _______________________ Arley PhenixJohn Tynisha Ogan, Psy.D.

## 2018-02-08 NOTE — Progress Notes (Signed)
Speech Language Pathology Daily Session Note  Patient Details  Name: Nathan PhilipsRandleman D Yoho Jr. MRN: [id number] Date of Birth: 01-04-1946  Today's Date: 02/08/2018 SLP Individual Time: 1302-1330 SLP Individual Time Calculation (min): 28 min  Short Term Goals: Week 1: SLP Short Term Goal 1 (Week 1): Patient will demonstrate basic problem solving for functional and familiar tasks with Min A verbal cues.  SLP Short Term Goal 2 (Week 1): Patient will demonstrate recall of daily information with Min A verbal cues for use of compoensatory strategies.  SLP Short Term Goal 3 (Week 1): Patient will demonstrate selective attention in a mildly distracting enviornment for ~30 minutes with supervision verbal cues.  SLP Short Term Goal 4 (Week 1): Patient will self-monitor and correct errors during functional tasks with Min A verbal cues.   Skilled Therapeutic Interventions:Skilled ST services focused on cognitive skills. SLP facilitated recall of today's events utilizing memory book pt required min A verbal cues. Pt demonstarted increase internal distractions with increase in verbal output, requiring min A verbal cues to redirect to task. SLP facilitated semi-complex problem solving skills utilizing medication management, pt demonstrated verbal comprehension of medication dosage twice a day (am/pm) , however functional required mod A verbal cues for problem solving and error awarenes. Pt was left in room with call bell within reach and bed alaram set.ST reccomends to continue skilled ST services.      Function:  Eating Eating                 Cognition Comprehension Comprehension assist level: Follows basic conversation/direction with no assist  Expression   Expression assist level: Expresses complex 90% of the time/cues < 10% of the time  Social Interaction Social Interaction assist level: Interacts appropriately 90% of the time - Needs monitoring or encouragement for participation or interaction.   Problem Solving Problem solving assist level: Solves basic 50 - 74% of the time/requires cueing 25 - 49% of the time  Memory Memory assist level: Recognizes or recalls 50 - 74% of the time/requires cueing 25 - 49% of the time    Pain Pain Assessment Faces Pain Scale: Hurts little more Pain Type: Acute pain Pain Location: Leg Pain Orientation: Left Pain Descriptors / Indicators: Sharp;Cramping Pain Intervention(s): RN made aware  Therapy/Group: Individual Therapy  Donell Sliwinski  [facility] 02/08/2018, 2:34 PM

## 2018-02-09 ENCOUNTER — Inpatient Hospital Stay (HOSPITAL_COMMUNITY): Payer: Self-pay | Admitting: Occupational Therapy

## 2018-02-09 ENCOUNTER — Inpatient Hospital Stay (HOSPITAL_COMMUNITY): Payer: Self-pay | Admitting: Speech Pathology

## 2018-02-09 ENCOUNTER — Inpatient Hospital Stay (HOSPITAL_COMMUNITY): Payer: Self-pay | Admitting: Physical Therapy

## 2018-02-09 LAB — GLUCOSE, CAPILLARY
GLUCOSE-CAPILLARY: 132 mg/dL — AB (ref 70–99)
Glucose-Capillary: 92 mg/dL (ref 70–99)
Glucose-Capillary: 150 mg/dL — ABNORMAL HIGH (ref 70–99)
Glucose-Capillary: 149 mg/dL — ABNORMAL HIGH (ref 70–99)

## 2018-02-09 LAB — PROTIME-INR
INR: 3.44
Prothrombin Time: 34.4 seconds — ABNORMAL HIGH (ref 11.4–15.2)

## 2018-02-09 LAB — CBC
HCT: 28.1 % — ABNORMAL LOW (ref 39.0–52.0)
Hemoglobin: 9.2 g/dL — ABNORMAL LOW (ref 13.0–17.0)
MCH: 30.2 pg (ref 26.0–34.0)
MCHC: 32.7 g/dL (ref 30.0–36.0)
MCV: 92.1 fL (ref 78.0–100.0)
PLATELETS: 317 10*3/uL (ref 150–400)
RBC: 3.05 MIL/uL — ABNORMAL LOW (ref 4.22–5.81)
RDW: 12.9 % (ref 11.5–15.5)
WBC: 6.2 10*3/uL (ref 4.0–10.5)

## 2018-02-09 LAB — BASIC METABOLIC PANEL
Anion gap: 7 (ref 5–15)
BUN: 17 mg/dL (ref 8–23)
CALCIUM: 8.6 mg/dL — AB (ref 8.9–10.3)
CO2: 29 mmol/L (ref 22–32)
CREATININE: 0.88 mg/dL (ref 0.61–1.24)
Chloride: 97 mmol/L — ABNORMAL LOW (ref 98–111)
GFR calc Af Amer: >60 mL/min (ref >60)
Glucose, Bld: 102 mg/dL — ABNORMAL HIGH (ref 70–99)
Potassium: 4.9 mmol/L (ref 3.5–5.1)
Sodium: 133 mmol/L — ABNORMAL LOW (ref 135–145)

## 2018-02-09 MED ORDER — WARFARIN - PHARMACIST DOSING INPATIENT
Freq: Every day | Status: DC
Start: 2018-02-09 — End: 2018-02-24
  Administered 2018-02-13 – 2018-02-23 (×7)

## 2018-02-09 NOTE — Progress Notes (Signed)
ANTICOAGULATION CONSULT NOTE - Initial Consult  Pharmacy Consult for warfarin Indication: atrial fibrillation  Allergies  Allergen Reactions  . Fentanyl Shortness Of Breath  . Promethazine Hcl Anaphylaxis and Other (See Comments)    Cardiac arrest  . Foeniculum Vulgare Other (See Comments)    Fennel bulbs--nausea only  . Metformin And Related Diarrhea  . Penicillins Nausea Only    Has patient had a PCN reaction causing immediate rash, facial/tongue/throat swelling, SOB or lightheadedness with hypotension: no Has patient had a PCN reaction causing severe rash involving mucus membranes or skin necrosis: no Has patient had a PCN reaction that required hospitalization: no Has patient had a PCN reaction occurring within the last 10 years: yes If all of the above answers are "NO", then may proceed with Cephalosporin use.   . Sulfa Antibiotics Other (See Comments)    G.I. Upset    Patient Measurements: Height: 5' 11.5" (181.6 cm) Weight: 194 lb 10.7 oz (88.3 kg) IBW/kg (Calculated) : 76.45  Vital Signs: Temp: 97.7 F (36.5 C) (08/15 0320) Temp Source: Oral (08/15 0320) BP: 100/61 (08/15 0900) Pulse Rate: 62 (08/15 0320)  Labs: Recent Labs    02/07/18 0429 02/08/18 0606 02/09/18 0454  HGB  --   --  9.2*  HCT  --   --  28.1*  PLT  --   --  317  LABPROT 29.7* 31.3* 34.4*  INR 2.85 3.05 3.44  CREATININE  --   --  0.88    Estimated Creatinine Clearance: 85.7 mL/min (by C-G formula based on SCr of 0.88 mg/dL).   Medical History: Past Medical History:  Diagnosis Date  . Atrial fibrillation with RVR (HCC) 05/01/2017  . Cardiomyopathy (HCC) 05/01/2017  . CHF (congestive heart failure) (HCC)   . 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 Coronary artery disease   . DM (diabetes mellitus) type 2, uncontrolled, with ketoacidosis (HCC) 05/01/2017  . Essential hypertension 11/26/2016  . History of kidney stones   . HLD (hyperlipidemia) 05/01/2017   . IVCD (intraventricular conduction defect) 04/24/2015  . Mitral valve regurgitation   . Mixed dyslipidemia 11/26/2016  . 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 acetaminophen (TYLENOL) 325 MG tablet Take 650 mg by mouth every 6 (six) hours as needed (for pain.).    More than a month at Unknown time  . Amino Acids-Protein Hydrolys (FEEDING SUPPLEMENT, PRO-STAT SUGAR FREE 64,) LIQD Take 30 mLs by mouth 2 (two) times daily. 900 mL 0   . aspirin EC 81 MG tablet Take 81 mg by mouth daily.   01/23/2018 at 0430  . carvedilol (COREG) 3.125 MG tablet Take 1 tablet (3.125 mg total) by mouth 2 (two) times daily with a meal.     . cephALEXin (KEFLEX) 500 MG capsule Take 1 capsule (500 mg total) by mouth 4 (four) times daily. 28 capsule 0   . feeding supplement, GLUCERNA SHAKE, (GLUCERNA SHAKE) LIQD Take 237 mLs by mouth 3 (three) times daily between meals.  0   . glipiZIDE (GLUCOTROL XL) 10 MG 24 hr tablet Take 10 mg by mouth daily with breakfast.   3 01/22/2018 at Unknown time  . losartan (COZAAR) 25 MG tablet Take 0.5 tablets (12.5 mg total) by mouth at bedtime.     [name]. oxyCODONE (OXY IR/ROXICODONE) 5 MG immediate release tablet Take 1 tablet (5 mg total) by mouth every 4 (four) hours as  needed for moderate pain or breakthrough pain. 30 tablet 0   . repaglinide (PRANDIN) 0.5 MG tablet Take 1 tablet by mouth 3 (three) times daily with meals.   3 01/22/2018 at Unknown time  . simvastatin (ZOCOR) 40 MG tablet Take 0.5 tablets (20 mg total) by mouth at bedtime. 45 tablet 1 01/22/2018 at Unknown time  . spironolactone (ALDACTONE) 25 MG tablet Take 0.5 tablets (12.5 mg total) by mouth daily.     . tamsulosin (FLOMAX) 0.4 MG CAPS capsule Take 0.4 mg by mouth at bedtime.   3 01/22/2018 at Unknown time  . warfarin (COUMADIN) 5 MG tablet Take 1 tablet (5 mg total) by mouth daily at 6 PM.       Assessment: 71 YOM on warfarin for  Afib and a new mitral bioprosthetic valve. Currently on a dose of warfarin 5 mg daily. Pharmacy consulted to manage dosing. INR today has risen to 3.44. H/H ow stable. Plt wnl   Goal of Therapy:  INR 2-3 Monitor platelets by anticoagulation protocol: Yes   Plan:  -Hold warfarin today. Likely can resume at a lower dose tomorrow -Monitor daily PT/INR  Vinnie LevelBenjamin Poseidon Pam, PharmD., BCPS Clinical Pharmacist Clinical phone for 02/09/18 until 3:30pm: [phone] If after 3:30pm, please [facility] to [facility] for unit-specific pharmacist

## 2018-02-09 NOTE — Progress Notes (Signed)
Speech Language Pathology Daily Session Note  Patient Details  Name: Nathan PhilipsRandleman D Micallef Jr. MRN: [id number] Date of Birth: [birth year]  Today's Date: 02/09/2018 SLP Individual Time: [id number] SLP Individual Time Calculation (min): 60 min  Short Term Goals: Week 1: SLP Short Term Goal 1 (Week 1): Patient will demonstrate basic problem solving for functional and familiar tasks with Min A verbal cues.  SLP Short Term Goal 2 (Week 1): Patient will demonstrate recall of daily information with Min A verbal cues for use of compoensatory strategies.  SLP Short Term Goal 3 (Week 1): Patient will demonstrate selective attention in a mildly distracting enviornment for ~30 minutes with supervision verbal cues.  SLP Short Term Goal 4 (Week 1): Patient will self-monitor and correct errors during functional tasks with Min A verbal cues.   Skilled Therapeutic Interventions:  Skilled treatment session focused on cognition goals. SLP facilitated session by providing education on memory book and purpose of memory book. Education provided on internal distractions and excessive verbosity as hindrance to therapy objects as it decreased the amount of skilled therapy that can be provided. Dietary staff in to take order for supper and pt requested multiple repetitions of items d/t STM (present at baseline). Pt able with baseline verbosity with dietary and took more than a reasonable time to complete task. Pt was left upright in bed, bed alarm on and all needs within reach. Continue per current plan of care.      Function:  Eating Eating   Modified Consistency Diet: Yes Eating Assist Level: More than reasonable amount of time           Cognition Comprehension Comprehension assist level: Follows basic conversation/direction with no assist  Expression   Expression assist level: Expresses basic needs/ideas: With no assist  Social Interaction Social Interaction assist level: Interacts appropriately 90% of the time  - Needs monitoring or encouragement for participation or interaction.  Problem Solving Problem solving assist level: Solves basic 50 - 74% of the time/requires cueing 25 - 49% of the time;Solves basic 75 - 89% of the time/requires cueing 10 - 24% of the time  Memory Memory assist level: Recognizes or recalls 50 - 74% of the time/requires cueing 25 - 49% of the time    Pain    Therapy/Group: Individual Therapy  Buford Bremer 02/09/2018, 2:18 PM

## 2018-02-09 NOTE — Progress Notes (Signed)
Occupational Therapy Session Note  Patient Details  Name: Nathan PhilipsRandleman D Murton Jr. MRN: [id number] Date of Birth: 12-26-1945  Today's Date: 02/09/2018 OT Individual Time: 0950-1020 OT Individual Time Calculation (min): 30 min    Short Term Goals: Week 1:  OT Short Term Goal 1 (Week 1): Pt will complete toilet transfer with mod A OT Short Term Goal 2 (Week 1): Pt will complete LB dressing with mod A  OT Short Term Goal 3 (Week 1): Pt will follow sternal precautions with min verbal cues within BADL session.   Skilled Therapeutic Interventions/Progress Updates: [facility] with nurse present.   He stated, "I am going, going, going, and I cannot do  Anything but sit on this thing.   I gotta get this out."   Patient did concur to complete oral care with setup and a few trunk and upper body stability movements.   He was left working with physical therapy at the end of the session.     Therapy Documentation Precautions:  Precautions Precautions: Fall, Sternal Restrictions Weight Bearing Restrictions: Yes Other Position/Activity Restrictions: sternal precautions  Pain:denied   See Function Navigator for Current Functional Status.   Therapy/Group: Individual Therapy  Nathan [name], Nathan [name] 02/09/2018, 3:21 PM

## 2018-02-09 NOTE — Progress Notes (Signed)
Occupational Therapy Session Note  Patient Details  Name: Nathan PhilipsRandleman D Ogden Jr. MRN: [id number] Date of Birth: 10/08/1945  Today's Date: 02/09/2018 OT Individual Time: [id number] OT Individual Time Calculation (min): 60 min    Short Term Goals: Week 1:  OT Short Term Goal 1 (Week 1): Pt will complete toilet transfer with mod A OT Short Term Goal 2 (Week 1): Pt will complete LB dressing with mod A  OT Short Term Goal 3 (Week 1): Pt will follow sternal precautions with min verbal cues within BADL session.   Skilled Therapeutic Interventions/Progress Updates:    Pt seen for OT session focusing on functional activity tolerance, functional transfers, and ADL re-training. Pt in supine upon arrival, awake and denies pain. Voiced having had rough night last night following enema and suppository. Willing to participate in tx session as able. He transferred to EOB with min A, max multi-modal cuing for sequencing/technique and attention to task.  Required significantly increased time sitting EOB due to complaints of nausea and increased anxiety. Led through deep breathing techniques, BP EOB 100/61. RN present and aware of complaints. Pt voiced need to have another BM. Completed min A stand pivot transfer to [facility], VCs for proper hand placement to maintain sternal pre-cautions. While seated on BSC, pt cont to require VCs for deep breathing techniques. He was able to eat some breakfast while seated [facility] and also able to self administer medications provided by nurse. He demonstrates increased anxiety this morning, saying he is unable to complete tasks such as self-feeding due to weakness, however, demonstrates ability to successfully manage drinking glass and utensils. Pt requesting more time seated on [facility] and for more privacy. Pt left seated on BSC with needs in reach and NT made aware of pt's position.   Therapy Documentation Precautions:  Precautions Precautions: Fall, Sternal Restrictions Weight  Bearing Restrictions: Yes Other Position/Activity Restrictions: sternal precautions ADL: ADL ADL Comments: Please see functional navigator  See Function Navigator for Current Functional Status.   Therapy/Group: Individual Therapy  Kayann Maj L 02/09/2018, 7:07 AM

## 2018-02-09 NOTE — Progress Notes (Signed)
Subjective/Complaints: Slept well, moved bowels after supp yesterday ROS  Neg CP, SOB, N/V/D Objective: Vital Signs: Blood pressure 115/65, pulse 62, temperature 97.7 F (36.5 C), temperature source Oral, resp. rate 17, height 5' 11.5" (1.816 m), weight 88.3 kg, SpO2 99 %. No results found. Results for orders placed or performed during the hospital encounter of 02/03/18 (from the past 72 hour(s))  Glucose, capillary     Status: Abnormal   Collection Time: 02/06/18 11:21 AM  Result Value Ref Range   Glucose-Capillary 135 (H) 70 - 99 mg/dL  Glucose, capillary     Status: Abnormal   Collection Time: 02/06/18  4:26 PM  Result Value Ref Range   Glucose-Capillary 142 (H) 70 - 99 mg/dL  Glucose, capillary     Status: Abnormal   Collection Time: 02/06/18  9:15 PM  Result Value Ref Range   Glucose-Capillary 124 (H) 70 - 99 mg/dL  Protime-INR     Status: Abnormal   Collection Time: 02/07/18  4:29 AM  Result Value Ref Range   Prothrombin Time 29.7 (H) 11.4 - 15.2 seconds   INR 2.85     Comment: Performed at [facility], [place] [address] [place], [place] [address]  Glucose, capillary     Status: None   Collection Time: 02/07/18  7:09 AM  Result Value Ref Range   Glucose-Capillary 91 70 - 99 mg/dL   Comment 1 Notify RN   Glucose, capillary     Status: Abnormal   Collection Time: 02/07/18 12:09 PM  Result Value Ref Range   Glucose-Capillary 140 (H) 70 - 99 mg/dL  Glucose, capillary     Status: Abnormal   Collection Time: 02/07/18  4:38 PM  Result Value Ref Range   Glucose-Capillary 113 (H) 70 - 99 mg/dL  Glucose, capillary     Status: Abnormal   Collection Time: 02/07/18  8:49 PM  Result Value Ref Range   Glucose-Capillary 134 (H) 70 - 99 mg/dL  Protime-INR     Status: Abnormal   Collection Time: 02/08/18  6:06 AM  Result Value Ref Range   Prothrombin Time 31.3 (H) 11.4 - 15.2 seconds   INR 3.05     Comment: Performed at [facility], [place] [address] [place],  [place] [address]  Glucose, capillary     Status: Abnormal   Collection Time: 02/08/18  6:36 AM  Result Value Ref Range   Glucose-Capillary 117 (H) 70 - 99 mg/dL  Glucose, capillary     Status: Abnormal   Collection Time: 02/08/18 11:34 AM  Result Value Ref Range   Glucose-Capillary 156 (H) 70 - 99 mg/dL  Glucose, capillary     Status: Abnormal   Collection Time: 02/08/18  4:36 PM  Result Value Ref Range   Glucose-Capillary 169 (H) 70 - 99 mg/dL  Glucose, capillary     Status: Abnormal   Collection Time: 02/08/18  9:04 PM  Result Value Ref Range   Glucose-Capillary 153 (H) 70 - 99 mg/dL  Protime-INR     Status: Abnormal   Collection Time: 02/09/18  4:54 AM  Result Value Ref Range   Prothrombin Time 34.4 (H) 11.4 - 15.2 seconds   INR 3.44     Comment: Performed at [facility], [place] [address] [place], [place] [address]  Basic metabolic panel     Status: Abnormal   Collection Time: 02/09/18  4:54 AM  Result Value Ref Range   Sodium 133 (L) 135 - 145 mmol/L  Potassium 4.9 3.5 - 5.1 mmol/L   Chloride 97 (L) 98 - 111 mmol/L   CO2 29 22 - 32 mmol/L   Glucose, Bld 102 (H) 70 - 99 mg/dL   BUN 17 8 - 23 mg/dL   Creatinine, Ser 0.88 0.61 - 1.24 mg/dL   Calcium 8.6 (L) 8.9 - 10.3 mg/dL   GFR calc non Af Amer >60 >60 mL/min   GFR calc Af Amer >60 >60 mL/min    Comment: (NOTE) The eGFR has been calculated using the CKD EPI equation. This calculation has not been validated in all clinical situations. eGFR's persistently <60 mL/min signify possible Chronic Kidney Disease.    Anion gap 7 5 - 15    Comment: Performed at [facility] [address], [place], [place] [address]  CBC     Status: Abnormal   Collection Time: 02/09/18  4:54 AM  Result Value Ref Range   WBC 6.2 4.0 - 10.5 K/uL   RBC 3.05 (L) 4.22 - 5.81 MIL/uL   Hemoglobin 9.2 (L) 13.0 - 17.0 g/dL   HCT 28.1 (L) 39.0 - 52.0 %   MCV 92.1 78.0 - 100.0 fL   MCH 30.2 26.0 - 34.0 pg   MCHC 32.7 30.0 - 36.0 g/dL   RDW  12.9 11.5 - 15.5 %   Platelets 317 150 - 400 K/uL    Comment: Performed at [facility], [place] [address] [place], [place] [address]  Glucose, capillary     Status: None   Collection Time: 02/09/18  6:15 AM  Result Value Ref Range   Glucose-Capillary 92 70 - 99 mg/dL     HEENT: edendulous Cardio: RRR and no murmur Resp: CTA B/L and unlabored GI: BS positive and Non tender Extremity:  No Edema Skin:   Intact and Wound C/D/I and sternotomy and R SVG site Neuro: Alert/Oriented, Normal Sensory, Abnormal Motor 4/5 LUE and LLE, 5/5 on Right side and Abnormal FMC Ataxic/ dec FMC Musc/Skel:  Other No pain with ROM Gen NAD   Assessment/Plan: 1. Functional deficits secondary to RIght MCA infarct  which require 3+ hours per day of interdisciplinary therapy in a comprehensive inpatient rehab setting. Physiatrist is providing close team supervision and 24 hour management of active medical problems listed below. Physiatrist and rehab team continue to assess barriers to discharge/monitor patient progress toward functional and medical goals. FIM: Function - Bathing Position: Wheelchair/chair at sink Body parts bathed by patient: Right arm, Left arm, Chest, Abdomen, Front perineal area, Buttocks, Right upper leg, Left upper leg, Right lower leg, Left lower leg Body parts bathed by helper: Back Bathing not applicable: Front perineal area, Buttocks, Right upper leg, Left upper leg, Right lower leg, Left lower leg(Declined LB bathing) Assist Level: Touching or steadying assistance(Pt > 75%)  Function- Upper Body Dressing/Undressing What is the patient wearing?: Pull over shirt/dress Pull over shirt/dress - Perfomed by patient: Thread/unthread right sleeve, Thread/unthread left sleeve, Pull shirt over trunk, Put head through opening Pull over shirt/dress - Perfomed by helper: Put head through opening, Pull shirt over trunk Assist Level: Supervision or verbal cues Function - Lower Body  Dressing/Undressing What is the patient wearing?: Pants, Non-skid slipper socks, Ted Hose Position: Wheelchair/chair at [employer]- Performed by patient: Thread/unthread right pants leg, Thread/unthread left pants leg, Pull pants up/down Pants- Performed by helper: Thread/unthread right pants leg, Thread/unthread left pants leg, Pull pants up/down Non-skid slipper socks- Performed by patient: Don/doff right sock, Don/doff left sock Non-skid slipper  socks- Performed by helper: Don/doff right sock, Don/doff left sock TED Hose - Performed by helper: Don/doff right TED hose, Don/doff left TED hose Assist for footwear: Supervision/touching assist Assist for lower body dressing: Touching or steadying assistance (Pt > 75%)  Function - Toileting Toileting activity did not occur: No continent bowel/bladder event Toileting steps completed by helper: Adjust clothing prior to toileting, Performs perineal hygiene, Adjust clothing after toileting Toileting Assistive Devices: Other (comment)(Foley Cath) Assist level: Touching or steadying assistance (Pt.75%)  Function - Toilet Transfers Toilet transfer activity did not occur: Refused Toilet transfer assistive device: Elevated toilet seat/BSC over toilet, Grab bar Assist level to toilet: Moderate assist (Pt 50 - 74%/lift or lower) Assist level from toilet: Moderate assist (Pt 50 - 74%/lift or lower)  Function - Chair/bed transfer Chair/bed transfer method: Stand pivot Chair/bed transfer assist level: Touching or steadying assistance (Pt > 75%) Chair/bed transfer assistive device: Walker Chair/bed transfer details: Verbal cues for sequencing, Verbal cues for technique, Verbal cues for precautions/safety, Manual facilitation for weight shifting, Manual facilitation for placement  Function - Locomotion: Wheelchair Will patient use wheelchair at discharge?: No Function - Locomotion: Ambulation Ambulation activity did not occur: Safety/medical  concerns Assistive device: Walker-rolling Max distance: 20 Assist level: Touching or steadying assistance (Pt > 75%) Assist level: Touching or steadying assistance (Pt > 75%)  Function - Comprehension Comprehension: Auditory Comprehension assist level: Follows basic conversation/direction with no assist  Function - Expression Expression: Verbal Expression assist level: Expresses complex 90% of the time/cues < 10% of the time  Function - Social Interaction Social Interaction assist level: Interacts appropriately 90% of the time - Needs monitoring or encouragement for participation or interaction.  Function - Problem Solving Problem solving assist level: Solves basic 50 - 74% of the time/requires cueing 25 - 49% of the time  Function - Memory Memory assist level: Recognizes or recalls 50 - 74% of the time/requires cueing 25 - 49% of the time Patient normally able to recall (first 3 days only): Current season, Location of own room, Staff names and faces, That he or she is in a hospital    Medical Problem List and Plan: 1.Debility and LEFT  hemiparesissecondary to Right MCA (chronic) and deconditioning after CABG/MVR -CIR PT, OT discussed tent d/c 8/29 2. DVT Prophylaxis/Anticoagulation: Pharmaceutical:Coumadin per pharm protocol 3. Pain Management:prn tylenol. Oxycodone or tramadol for more severe pain 4. Mood:LCSW to follow for evaluation and support. 5. Neuropsych: This patientis not fullycapable of making decisions on hisown behalf. 6. Skin/Wound Care:incisions look good Add protein supplements between meals 7. Fluids/Electrolytes/Nutrition:Strict I/O. Intake poor due to recent teeth extraction. Check lytes in am.  8. CAD s/p CABG with MVR: Monitor for symptoms with activity. ON coumadin and Lipitor--coreg added  9.PAF: In A fib today but rate controlled on warfarin--coreg added today.  10. R-MCA stroke with extension: Left sided weakness resolving. On  coumadin.  11. Chronic systolic CHF: Pulmonary edema improving --encourage IS. Monitor weights daily with strict I/O. On Spironolactone, losartan, coreg and Lipitor.  12. H/o BPH/Acute Urinary retention: Question of delirium--will check UA/UCS. Foleywasplaced today to help decompress bladder. Continue foleyfor home D/C-hematuria persists appreciate Uro input, no clots  13. Constipation: Willincrease mirlax to bid--suppository today. and furtheraugment bowel programaslikely contributing to retention.  14. ABLA/ Continue to monitor H/H for recovery.   15. Hyponatremia: Continue to monitor for now.  16. T2DM: Monitor BS ac/hs. On levemir daily with SSI for tighter BS control. 17.  Leukocytosis Resolved , due to cystits,  citrobacter sens to Keflex-  LOS (Days) 6 A FACE TO FACE EVALUATION WAS PERFORMED  Charlett Blake 02/09/2018, 7:44 AM

## 2018-02-09 NOTE — Progress Notes (Signed)
Physical Therapy Session Note  Patient Details  Name: Nathan PhilipsRandleman D Kurihara Jr. MRN: [id number] Date of Birth: [birth year]  Today's Date: 02/09/2018 PT Individual Time: 1000-1025 PT Individual Time Calculation (min): 25 min   Short Term Goals: Week 1:  PT Short Term Goal 1 (Week 1): Pt will complete bed mobility with min A with good adherence to sternal precautions PT Short Term Goal 2 (Week 1): Pt will complete transfer with LRAD and mod A consistently PT Short Term Goal 3 (Week 1): Pt will initiate gait training  Skilled Therapeutic Interventions/Progress Updates: Pt received on BSC with handoff from OT; c/o severe abdominal pain, nausea, unable to void, and reports he was up all night with BMs after taking mag citrate. Pt remained on toilet several minutes attempting to void however ultimately unable, reporting significant pain and discomfort, fatigue. Brief donned totalA; sit >[facility], stand pivot back to bed with RW and minA, increased cues for technique as pt distracted by pain and fear of falling d/t report of weakness. Sit >supine minA for LE management. Remained in bed, alarm intact, all needs in reach at end of session. Missed 35 min d/t pain, discomfort and toileting needs.     Therapy Documentation Precautions:  Precautions Precautions: Fall, Sternal Restrictions Weight Bearing Restrictions: Yes Other Position/Activity Restrictions: sternal precautions General: PT Amount of Missed Time (min): 35 Minutes PT Missed Treatment Reason: Toileting;Pain  See Function Navigator for Current Functional Status.   Therapy/Group: Individual Therapy  [name] 02/09/2018, 10:31 AM

## 2018-02-09 NOTE — Plan of Care (Signed)
  Problem: Consults Goal: RH GENERAL PATIENT EDUCATION Description See Patient Education module for education specifics. Outcome: Progressing Goal: Skin Care Protocol Initiated - if Braden Score 18 or less Description If consults are not indicated, leave blank or document N/A Outcome: Progressing Goal: Diabetes Guidelines if Diabetic/Glucose > 140 Description If diabetic or lab glucose is > 140 mg/dl - Initiate Diabetes/Hyperglycemia Guidelines & Document Interventions  Outcome: Progressing   Problem: RH BOWEL ELIMINATION Goal: RH STG MANAGE BOWEL WITH ASSISTANCE Description STG Manage Bowel with min Assistance.  Outcome: Progressing Flowsheets (Taken 02/09/2018 1557) STG: Pt will manage bowels with assistance: 4-Minimum assistance Goal: RH STG MANAGE BOWEL W/MEDICATION W/ASSISTANCE Description STG Manage Bowel with Medication with min Assistance.  Outcome: Progressing Flowsheets (Taken 02/09/2018 1557) STG: Pt will manage bowels with medication with assistance: 4-Minimal assistance   Problem: RH BLADDER ELIMINATION Goal: RH STG MANAGE BLADDER WITH ASSISTANCE Description STG Manage Bladder With mod Assistance  Outcome: Progressing Flowsheets (Taken 02/09/2018 1557) STG: Pt will manage bladder with assistance: 1-Total assistance Goal: RH STG MANAGE BLADDER WITH MEDICATION WITH ASSISTANCE Description STG Manage Bladder With Medication With min Assistance.  Outcome: Progressing Flowsheets (Taken 02/09/2018 1557) STG: Pt will manage bladder with medication with assistance: 1-Total assistance Goal: RH STG MANAGE BLADDER WITH EQUIPMENT WITH ASSISTANCE Description STG Manage Bladder With Equipment With total Assistance  Outcome: Progressing Flowsheets (Taken 02/09/2018 1557) STG: Pt will manage bladder with equipment with assistance: 1-Total assistance   Problem: RH SKIN INTEGRITY Goal: RH STG SKIN FREE OF INFECTION/BREAKDOWN Description Patients skin will remain free from  further infection or breakdown with mod assist.  Outcome: Progressing Goal: RH STG ABLE TO PERFORM INCISION/WOUND CARE W/ASSISTANCE Description STG Able To Perform Incision/Wound Care With mod/max Assistance.  Outcome: Progressing Flowsheets (Taken 02/09/2018 1557) STG: Pt will be able to perform incision/wound care with assistance: 5-Supervision/set up   Problem: RH SAFETY Goal: RH STG ADHERE TO SAFETY PRECAUTIONS W/ASSISTANCE/DEVICE Description STG Adhere to Safety Precautions With min Assistance/Device.  Outcome: Progressing Flowsheets (Taken 02/09/2018 1557) STG:Pt will adhere to safety precautions with assistance/device: 3-Moderate assistance   Problem: RH PAIN MANAGEMENT Goal: RH STG PAIN MANAGED AT OR BELOW PT'S PAIN GOAL Description < 4  Outcome: Progressing

## 2018-02-10 ENCOUNTER — Inpatient Hospital Stay (HOSPITAL_COMMUNITY): Payer: Self-pay | Admitting: Speech Pathology

## 2018-02-10 ENCOUNTER — Inpatient Hospital Stay (HOSPITAL_COMMUNITY): Payer: Self-pay | Admitting: Occupational Therapy

## 2018-02-10 ENCOUNTER — Inpatient Hospital Stay (HOSPITAL_COMMUNITY): Payer: Self-pay | Admitting: Physical Therapy

## 2018-02-10 LAB — GLUCOSE, CAPILLARY
Glucose-Capillary: 131 mg/dL — ABNORMAL HIGH (ref 70–99)
Glucose-Capillary: 148 mg/dL — ABNORMAL HIGH (ref 70–99)
Glucose-Capillary: 117 mg/dL — ABNORMAL HIGH (ref 70–99)
Glucose-Capillary: 114 mg/dL — ABNORMAL HIGH (ref 70–99)

## 2018-02-10 LAB — PROTIME-INR
INR: 3.7
PROTHROMBIN TIME: 36.4 s — AB (ref 11.4–15.2)

## 2018-02-10 NOTE — Progress Notes (Signed)
Occupational Therapy Session Note  Patient Details  Name: Nathan [name]. MRN: [id number] Date of Birth: 02-Jul-1945  Today's Date: 02/10/2018 OT Individual Time: 0830-0900 OT Individual Time Calculation (min): 30 min    Short Term Goals: Week 1:  OT Short Term Goal 1 (Week 1): Pt will complete toilet transfer with mod A OT Short Term Goal 1 - Progress (Week 1): Met OT Short Term Goal 2 (Week 1): Pt will complete LB dressing with mod A  OT Short Term Goal 2 - Progress (Week 1): Met OT Short Term Goal 3 (Week 1): Pt will follow sternal precautions with min verbal cues within BADL session.  OT Short Term Goal 3 - Progress (Week 1): Met  Skilled Therapeutic Interventions/Progress Updates:    1:1 Pt in bed when arrived. Pt transitioned to the EOB with min A with extra time and instructional cues. At EOB engaged in dressing sit to stand with VC to maintain sternal precautions. Pt able to thread pants and don shirt. Pt engaged in oral care with setup of supplies at EOB. A with washing hair at bedside. Pt ambulated with RW to recliner with min A with cues for larger step when advancing his left LE. Pt reports feeling better and more like himself today compared to yesterday.  Left resting in the recliner at end of session.   Therapy Documentation Precautions:  Precautions Precautions: Fall, Sternal Restrictions Weight Bearing Restrictions: No Other Position/Activity Restrictions: sternal precautions Pain: No c/o pain in session  ADL: ADL ADL Comments: Please see functional navigator Other Treatments:    See Function Navigator for Current Functional Status.   Therapy/Group: Individual Therapy  Willeen Cass [facility] 02/10/2018, 1:54 PM

## 2018-02-10 NOTE — Progress Notes (Signed)
Speech Language Pathology Weekly Progress and Session Note  Patient Details  Name: Nathan [name]. MRN: [id number] Date of Birth: [birth year]  Beginning of progress report period: February 03, 2018 End of progress report period: February 10, 2018  Today's Date: 02/10/2018 SLP Individual Time: 0915-1000 SLP Individual Time Calculation (min): 45 min  Short Term Goals: Week 1: SLP Short Term Goal 1 (Week 1): Patient will demonstrate basic problem solving for functional and familiar tasks with Min A verbal cues.  SLP Short Term Goal 1 - Progress (Week 1): Met SLP Short Term Goal 2 (Week 1): Patient will demonstrate recall of daily information with Min A verbal cues for use of compoensatory strategies.  SLP Short Term Goal 2 - Progress (Week 1): Not met SLP Short Term Goal 3 (Week 1): Patient will demonstrate selective attention in a mildly distracting enviornment for ~30 minutes with supervision verbal cues.  SLP Short Term Goal 3 - Progress (Week 1): Met SLP Short Term Goal 4 (Week 1): Patient will self-monitor and correct errors during functional tasks with Min A verbal cues.  SLP Short Term Goal 4 - Progress (Week 1): Not met    New Short Term Goals: Week 2: SLP Short Term Goal 1 (Week 2): Patient will complete semi-complex problem solving tasks with Min A verbal cues.  SLP Short Term Goal 2 (Week 2): Patient will demonstrate recall of daily information with Min A verbal cues for use of compensatory strategies.  SLP Short Term Goal 3 (Week 2): Patient will demonstrate selective attention in a moderately distracting enviornment for ~45 minutes with supervision verbal cues.  SLP Short Term Goal 4 (Week 2): Patient will self-monitor and correct errors during functional tasks with Min A verbal cues.   Weekly Progress Updates: Pt has made slow progress during this reporting period and as a result he has only met 2 of 4 STGs. Pt's largest hindrance to therapy is his verbosity that monopolizes  his attention to task and increases the amount of time it takes for him to complete task. Pt is also self-limiting and is able to transfer from bed to wheelchair etc but he enjoys talking excessively about how difficult and fatiguing the task is but is able to perform task functionally once he actually does task. Because of these deficits, it is not likely that pt can be left alone and will require at least 24 hour supervision or possibly Min A at discharge. Skilled ST continues to be indicated.      Intensity: Minumum of 1-2 x/day, 30 to 90 minutes Frequency: 3 to 5 out of 7 days Duration/Length of Stay: 12-14 days  Treatment/Interventions: Cognitive remediation/compensation;Environmental controls;Internal/external aids;Therapeutic Activities;Patient/family education;Cueing hierarchy;Functional tasks   Daily Session  Skilled Therapeutic Interventions: Skilled treatment session focused on cognition goals. SLP facilitated session by providing Min A cues for use of sternal precautions during transfer from recliner to wheelchair. Pt with excessive verbosity about effort it requires to transfer etc. Therefore transfer takes more than a reasonable amount of time d/t to verbosity - not d/t physical or cognitive deficits. SLP further facilitated session by providing supervision while pt dealt deck of cards into 2 piles. Pt with decreased self-awareness of which pile he had dealt to but at end of game he was able to reflect on fact that one player had far more cards. With Min A cues pt able to reflect and SLP provided information on how inability would translate into daily tasks at home. Again pt with multiple  excuses and monopolizing rationale. Finally, SLP instructed pt not to alk while performing purposeful tasks to increase speed and accuracy.        Function:     Cognition Comprehension Comprehension assist level: Follows basic conversation/direction with no assist  Expression   Expression assist  level: Expresses complex ideas: With extra time/assistive device  Social Interaction Social Interaction assist level: Interacts appropriately 90% of the time - Needs monitoring or encouragement for participation or interaction.  Problem Solving Problem solving assist level: Solves basic 90% of the time/requires cueing < 10% of the time  Memory Memory assist level: Recognizes or recalls 50 - 74% of the time/requires cueing 25 - 49% of the time;Recognizes or recalls 75 - 89% of the time/requires cueing 10 - 24% of the time   General    Pain Pain Assessment Pain Scale: 0-10 Pain Score: 0-No pain  Therapy/Group: Individual Therapy  Rosealie Reach 02/10/2018, 10:49 AM

## 2018-02-10 NOTE — Plan of Care (Signed)
  Problem: Consults Goal: RH GENERAL PATIENT EDUCATION Description See Patient Education module for education specifics. Outcome: Progressing Goal: Diabetes Guidelines if Diabetic/Glucose > 140 Description If diabetic or lab glucose is > 140 mg/dl - Initiate Diabetes/Hyperglycemia Guidelines & Document Interventions  Outcome: Progressing   Problem: RH BOWEL ELIMINATION Goal: RH STG MANAGE BOWEL WITH ASSISTANCE Description STG Manage Bowel with min Assistance.  Outcome: Progressing Goal: RH STG MANAGE BOWEL W/MEDICATION W/ASSISTANCE Description STG Manage Bowel with Medication with min Assistance.  Outcome: Progressing   Problem: RH SKIN INTEGRITY Goal: RH STG SKIN FREE OF INFECTION/BREAKDOWN Description Patients skin will remain free from further infection or breakdown with mod assist.  Outcome: Progressing

## 2018-02-10 NOTE — Progress Notes (Signed)
Subjective/Complaints: Good BMs after bowel program Discussed warfarin with pharm yest pm ROS  Neg CP, SOB, N/V/D Objective: Vital Signs: Blood pressure 109/66, pulse 76, temperature 98.3 F (36.8 C), temperature source Oral, resp. rate 15, height 5' 11.5" (1.816 m), weight 89.2 kg, SpO2 97 %. No results found. Results for orders placed or performed during the hospital encounter of 02/03/18 (from the past 72 hour(s))  Glucose, capillary     Status: Abnormal   Collection Time: 02/07/18 12:09 PM  Result Value Ref Range   Glucose-Capillary 140 (H) 70 - 99 mg/dL  Glucose, capillary     Status: Abnormal   Collection Time: 02/07/18  4:38 PM  Result Value Ref Range   Glucose-Capillary 113 (H) 70 - 99 mg/dL  Glucose, capillary     Status: Abnormal   Collection Time: 02/07/18  8:49 PM  Result Value Ref Range   Glucose-Capillary 134 (H) 70 - 99 mg/dL  Protime-INR     Status: Abnormal   Collection Time: 02/08/18  6:06 AM  Result Value Ref Range   Prothrombin Time 31.3 (H) 11.4 - 15.2 seconds   INR 3.05     Comment: Performed at [facility], [place] [address] [place], [place] [address]  Glucose, capillary     Status: Abnormal   Collection Time: 02/08/18  6:36 AM  Result Value Ref Range   Glucose-Capillary 117 (H) 70 - 99 mg/dL  Glucose, capillary     Status: Abnormal   Collection Time: 02/08/18 11:34 AM  Result Value Ref Range   Glucose-Capillary 156 (H) 70 - 99 mg/dL  Glucose, capillary     Status: Abnormal   Collection Time: 02/08/18  4:36 PM  Result Value Ref Range   Glucose-Capillary 169 (H) 70 - 99 mg/dL  Glucose, capillary     Status: Abnormal   Collection Time: 02/08/18  9:04 PM  Result Value Ref Range   Glucose-Capillary 153 (H) 70 - 99 mg/dL  Protime-INR     Status: Abnormal   Collection Time: 02/09/18  4:54 AM  Result Value Ref Range   Prothrombin Time 34.4 (H) 11.4 - 15.2 seconds   INR 3.44     Comment: Performed at [facility], [place] [address],  [place], [place] [address]  Basic metabolic panel     Status: Abnormal   Collection Time: 02/09/18  4:54 AM  Result Value Ref Range   Sodium 133 (L) 135 - 145 mmol/L   Potassium 4.9 3.5 - 5.1 mmol/L   Chloride 97 (L) 98 - 111 mmol/L   CO2 29 22 - 32 mmol/L   Glucose, Bld 102 (H) 70 - 99 mg/dL   BUN 17 8 - 23 mg/dL   Creatinine, Ser 0.88 0.61 - 1.24 mg/dL   Calcium 8.6 (L) 8.9 - 10.3 mg/dL   GFR calc non Af Amer >60 >60 mL/min   GFR calc Af Amer >60 >60 mL/min    Comment: (NOTE) The eGFR has been calculated using the CKD EPI equation. This calculation has not been validated in all clinical situations. eGFR's persistently <60 mL/min signify possible Chronic Kidney Disease.    Anion gap 7 5 - 15    Comment: Performed at [facility] [address] [place] [address]  CBC     Status: Abnormal   Collection Time: 02/09/18  4:54 AM  Result Value Ref Range   WBC 6.2 4.0 - 10.5 K/uL   RBC 3.05 (L) 4.22 - 5.81 MIL/uL  Hemoglobin 9.2 (L) 13.0 - 17.0 g/dL   HCT 28.1 (L) 39.0 - 52.0 %   MCV 92.1 78.0 - 100.0 fL   MCH 30.2 26.0 - 34.0 pg   MCHC 32.7 30.0 - 36.0 g/dL   RDW 12.9 11.5 - 15.5 %   Platelets 317 150 - 400 K/uL    Comment: Performed at [facility], [address] [place], [place] [address]  Glucose, capillary     Status: None   Collection Time: 02/09/18  6:15 AM  Result Value Ref Range   Glucose-Capillary 92 70 - 99 mg/dL  Glucose, capillary     Status: Abnormal   Collection Time: 02/09/18 11:44 AM  Result Value Ref Range   Glucose-Capillary 132 (H) 70 - 99 mg/dL  Glucose, capillary     Status: Abnormal   Collection Time: 02/09/18  4:39 PM  Result Value Ref Range   Glucose-Capillary 150 (H) 70 - 99 mg/dL  Glucose, capillary     Status: Abnormal   Collection Time: 02/09/18  9:22 PM  Result Value Ref Range   Glucose-Capillary 149 (H) 70 - 99 mg/dL  Protime-INR     Status: Abnormal   Collection Time: 02/10/18  5:56 AM  Result Value Ref Range    Prothrombin Time 36.4 (H) 11.4 - 15.2 seconds   INR 3.70     Comment: Performed at [facility], [address] [place], [place] [address]  Glucose, capillary     Status: Abnormal   Collection Time: 02/10/18  6:02 AM  Result Value Ref Range   Glucose-Capillary 131 (H) 70 - 99 mg/dL     HEENT: edendulous Cardio: RRR and no murmur Resp: CTA B/L and unlabored GI: BS positive and Non tender Extremity:  No Edema Skin:   Intact and Wound C/D/I and sternotomy and R SVG site Neuro: Alert/Oriented, Normal Sensory, Abnormal Motor 4/5 LUE and LLE, 5/5 on Right side and Abnormal FMC Ataxic/ dec FMC Musc/Skel:  Other No pain with ROM Gen NAD   Assessment/Plan: 1. Functional deficits secondary to RIght MCA infarct  which require 3+ hours per day of interdisciplinary therapy in a comprehensive inpatient rehab setting. Physiatrist is providing close team supervision and 24 hour management of active medical problems listed below. Physiatrist and rehab team continue to assess barriers to discharge/monitor patient progress toward functional and medical goals. FIM: Function - Bathing Position: Wheelchair/chair at sink Body parts bathed by patient: Right arm, Left arm, Chest, Abdomen, Front perineal area, Buttocks, Right upper leg, Left upper leg, Right lower leg, Left lower leg Body parts bathed by helper: Back Bathing not applicable: Front perineal area, Buttocks, Right upper leg, Left upper leg, Right lower leg, Left lower leg(Declined LB bathing) Assist Level: Touching or steadying assistance(Pt > 75%)  Function- Upper Body Dressing/Undressing What is the patient wearing?: Pull over shirt/dress Pull over shirt/dress - Perfomed by patient: Thread/unthread right sleeve, Thread/unthread left sleeve, Pull shirt over trunk, Put head through opening Pull over shirt/dress - Perfomed by helper: Put head through opening, Pull shirt over trunk Assist Level: Supervision or verbal cues Function -  Lower Body Dressing/Undressing What is the patient wearing?: Pants, Non-skid slipper socks, Ted Hose Position: Wheelchair/chair at sink Pants- Performed by patient: Thread/unthread right pants leg, Thread/unthread left pants leg, Pull pants up/down Pants- Performed by helper: Thread/unthread right pants leg, Thread/unthread left pants leg, Pull pants up/down Non-skid slipper socks- Performed by patient: Don/doff right sock, Don/doff left sock Non-skid slipper socks- Performed   by helper: Don/doff right sock, Don/doff left sock TED Hose - Performed by helper: Don/doff right TED hose, Don/doff left TED hose Assist for footwear: Supervision/touching assist Assist for lower body dressing: Touching or steadying assistance (Pt > 75%)  Function - Toileting Toileting activity did not occur: No continent bowel/bladder event Toileting steps completed by helper: Performs perineal hygiene, Adjust clothing after toileting Toileting Assistive Devices: Other (comment)(Foley Cath) Assist level: Touching or steadying assistance (Pt.75%)  Function - Toilet Transfers Toilet transfer activity did not occur: Refused Toilet transfer assistive device: Bedside commode, Walker Assist level to toilet: Moderate assist (Pt 50 - 74%/lift or lower) Assist level from toilet: Moderate assist (Pt 50 - 74%/lift or lower) Assist level to bedside commode (at bedside): Touching or steadying assistance (Pt > 75%) Assist level from bedside commode (at bedside): Touching or steadying assistance (Pt > 75%)  Function - Chair/bed transfer Chair/bed transfer method: Stand pivot Chair/bed transfer assist level: Touching or steadying assistance (Pt > 75%) Chair/bed transfer assistive device: Walker Chair/bed transfer details: Verbal cues for sequencing, Verbal cues for technique, Verbal cues for precautions/safety, Manual facilitation for weight shifting, Manual facilitation for placement  Function - Locomotion: Wheelchair Will  patient use wheelchair at discharge?: No Function - Locomotion: Ambulation Ambulation activity did not occur: Safety/medical concerns Assistive device: Walker-rolling Max distance: 20 Assist level: Touching or steadying assistance (Pt > 75%) Assist level: Touching or steadying assistance (Pt > 75%)  Function - Comprehension Comprehension: Auditory Comprehension assist level: Follows complex conversation/direction with extra time/assistive device  Function - Expression Expression: Verbal Expression assist level: Expresses complex ideas: With extra time/assistive device  Function - Social Interaction Social Interaction assist level: Interacts appropriately 90% of the time - Needs monitoring or encouragement for participation or interaction.  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 DVT Prophylaxis/Anticoagulation: Pharmaceutical:Coumadin per pharm protocol 3. Pain Management:prn tylenol. Oxycodone or tramadol for more severe pain 4. Mood:LCSW to follow for evaluation and support. 5. Neuropsych: This patientis not fullycapable of making decisions on hisown behalf. 6. Skin/Wound Care:incisions look good Add protein supplements between meals 7. Fluids/Electrolytes/Nutrition:Strict I/O. Intake poor due to recent teeth extraction. Check lytes in am.  8. CAD s/p CABG with MVR: Monitor for symptoms with activity. ON coumadin and Lipitor--coreg added   9.PAF: In A fib today but rate controlled on warfarin--coreg added today.  10. R-MCA stroke with extension: Left sided weakness resolving. On coumadin.  11. Chronic systolic CHF: Pulmonary edema improving --encourage IS. Monitor weights daily with strict I/O. On Spironolactone, losartan, coreg and Lipitor.  12. H/o BPH/Acute Urinary retention: Question of delirium--will check UA/UCS. Foleywasplaced today to help decompress bladder. Continue foleyfor home D/C-hematuria persists appreciate Uro input, no clots  13. Constipation: Willincrease mirlax to bid--suppository yesterday and enema 14. ABLA/ Continue to monitor H/H for recovery.   15. Hyponatremia: Continue to monitor for now.  16. T2DM: Monitor BS ac/hs. On levemir daily with SSI for tighter BS control. CBG (  last 3)  Recent Labs    02/09/18 1639 02/09/18 2122 02/10/18 0602  GLUCAP 150* 149* 131*  Controlled 8/16   17.  Leukocytosis Resolved , due to cystits, citrobacter sens to Keflex- D/C Today LOS (Days) 7 A FACE TO FACE EVALUATION WAS PERFORMED  Charlett Blake 02/10/2018, 7:49 AM

## 2018-02-10 NOTE — Progress Notes (Signed)
Occupational Therapy Weekly Progress Note  Patient Details  Name: Nathan [name]. MRN: [id number] Date of Birth: 1945-10-28  Beginning of progress report period: February 04, 2018 End of progress report period: February 10, 2018  Today's Date: 02/10/2018 OT Individual Time: 1100-1200 OT Individual Time Calculation (min): 60 min    Patient has met 3 of 3 short term goals.  Pt making slow but steady progress towards OT goals. He cont to be most limited by decreased functional activity tolerance, cognitive deficits including perseveration and poor attention. Pt requiring significantly increased time and rest breaks during functional tasks and mobility as well as max cuing for redirection to task.   Patient continues to demonstrate the following deficits:ataxia, cognitive deficits, hemiplegia affecting dominant side and muscle weakness (generalized) and therefore will continue to benefit from skilled OT intervention to enhance overall performance with BADL and Reduce care partner burden.  Patient progressing toward long term goals..  Continue plan of care.  OT Short Term Goals Week 1:  OT Short Term Goal 1 (Week 1): Pt will complete toilet transfer with mod A OT Short Term Goal 1 - Progress (Week 1): Met OT Short Term Goal 2 (Week 1): Pt will complete LB dressing with mod A  OT Short Term Goal 2 - Progress (Week 1): Met OT Short Term Goal 3 (Week 1): Pt will follow sternal precautions with min verbal cues within BADL session.  OT Short Term Goal 3 - Progress (Week 1): Met Week 2:  OT Short Term Goal 1 (Week 2): Pt will ambulate into bathroom with min A to increase functional ambulation abilities OT Short Term Goal 2 (Week 2): Pt will complete 2 grooming tasks in standing in order to increase functional activity tolerance and endurance OT Short Term Goal 3 (Week 2): Pt will complete full bathing/dressing task with min cuing for re-direction to task.  Skilled Therapeutic  Interventions/Progress Updates:    Pt seen for OT session focusing on functional activity tolerance and mobility. Pt sitting upright in w/c upon arrival, agreeable to tx session. Required mod cuing to recall events of previous tx sessions today.  Throughout session, pt required mod A for sit>stand from w/c with occasional cuing for proper set-up and hand palcement. Pt taken to pt laundry facility total A in w/c for time and energy conservation. Completed standing laundry task with CGA for dynamic standing balance. Pt tolerating ~2 minutes in standing before requiring seated rest break. During short distance ambulation, pt requiring VCs for L foot clearance and RW management.  In therapy gym, pt completed pip tree building task, able to correctly replicate and build 2 structures mod I, from standing position with guarding assist for balance. Seated rest breaks provided btwn trials.  Completed 2 trials of short distance ambulation, requiring same cuing as noted above. On second trial, placed 2# ankle weight on L LE for increased proprioceptive input. Pt  Demonstrating improved attention to and clearance of L LE with weight. Pt returned to room at end of session, left seated in w/c with all needs in reach, set-up with lunch tray and chair belt on.   Therapy Documentation Precautions:  Precautions Precautions: Fall, Sternal Restrictions Weight Bearing Restrictions: Yes Other Position/Activity Restrictions: sternal precautions ADL: ADL ADL Comments: Please see functional navigator  See Function Navigator for Current Functional Status.   Therapy/Group: Individual Therapy  Jaiah Weigel L 02/10/2018, 7:08 AM

## 2018-02-10 NOTE — Progress Notes (Signed)
ANTICOAGULATION CONSULT NOTE  Pharmacy Consult for warfarin Indication: atrial fibrillation  Allergies  Allergen Reactions  . Fentanyl Shortness Of Breath  . Promethazine Hcl Anaphylaxis and Other (See Comments)    Cardiac arrest  . Foeniculum Vulgare Other (See Comments)    Fennel bulbs--nausea only  . Metformin And Related Diarrhea  . Penicillins Nausea Only    Has patient had a PCN reaction causing immediate rash, facial/tongue/throat swelling, SOB or lightheadedness with hypotension: no Has patient had a PCN reaction causing severe rash involving mucus membranes or skin necrosis: no Has patient had a PCN reaction that required hospitalization: no Has patient had a PCN reaction occurring within the last 10 years: yes If all of the above answers are "NO", then may proceed with Cephalosporin use.   . Sulfa Antibiotics Other (See Comments)    G.I. Upset    Patient Measurements: Height: 5' 11.5" (181.6 cm) Weight: 196 lb 10.4 oz (89.2 kg) IBW/kg (Calculated) : 76.45  Vital Signs: Temp: 98 F (36.7 C) (08/16 0816) Temp Source: Oral (08/16 0816) BP: 104/68 (08/16 0816) Pulse Rate: 76 (08/16 0816)  Labs: Recent Labs    02/08/18 0606 02/09/18 0454 02/10/18 0556  HGB  --  9.2*  --   HCT  --  28.1*  --   PLT  --  317  --   LABPROT 31.3* 34.4* 36.4*  INR 3.05 3.44 3.70  CREATININE  --  0.88  --     Estimated Creatinine Clearance: 85.7 mL/min (by C-G formula based on SCr of 0.88 mg/dL).   Medical History: Past Medical History:  Diagnosis Date  . Atrial fibrillation with RVR (HCC) 05/01/2017  . Cardiomyopathy (HCC) 05/01/2017  . CHF (congestive heart failure) (HCC)   . 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 Coronary artery disease   . DM (diabetes mellitus) type 2, uncontrolled, with ketoacidosis (HCC) 05/01/2017  . Essential hypertension 11/26/2016  . History of kidney stones   . HLD (hyperlipidemia) 05/01/2017  . IVCD  (intraventricular conduction defect) 04/24/2015  . Mitral valve regurgitation   . Mixed dyslipidemia 11/26/2016  . 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 . Pharmacy consulted to manage dosing.  INR today has risen to 3.7. No cbc this am.   Goal of Therapy:  INR 2-3 Monitor platelets by anticoagulation protocol: Yes   Plan:  Continue to hold warfarin today. Likely can resume at a lower dose when trending down Monitor daily PT/INR  Nathan [name] PharmD., BCPS Clinical Pharmacist 02/10/2018 8:41 AM

## 2018-02-10 NOTE — Discharge Instructions (Addendum)
Inpatient Rehab Discharge Instructions  Nathan D Nathan RamusFerree Jr. Discharge date and time:    Activities/Precautions/ Functional Status: Activity: no lifting, driving, or strenuous exercise till cleared by MD. Continue sternal precautions.  Diet: cardiac diet--soft foods Wound Care: keep wound clean and dry   Functional status:  ___ No restrictions     ___ Walk up steps independently ___ 24/7 supervision/assistance   ___ Walk up steps with assistance ___ Intermittent supervision/assistance  ___ Bathe/dress independently ___ Walk with walker     ___ Bathe/dress with assistance ___ Walk Independently    ___ Shower independently ___ Walk with assistance    ___ Shower with assistance ___ No alcohol     ___ Return to work/school ________  Special Instructions: 1. Foley care twice a day. 2. Monitor blood sugars before meals and at bedtime. Follow up with primary MD for adjustment in medications.    My questions have been answered and I understand these instructions. I will adhere to these goals and the provided educational materials after my discharge from the hospital.  Patient/Caregiver Signature _______________________________ Date __________  Clinician Signature _______________________________________ Date __________  Please bring this form and your medication list with you to all your follow-up doctor's appointments. Information on my medicine - Coumadin   (Warfarin)  Why was Coumadin prescribed for you? Coumadin was prescribed for you because you have a blood clot or a medical condition that can cause an increased risk of forming blood clots. Blood clots can cause serious health problems by blocking the flow of blood to the heart, lung, or brain. Coumadin can prevent harmful blood clots from forming. As a reminder your indication for Coumadin is:   Stroke Prevention Because Of Atrial Fibrillation, history of stroke, and new valve replacement. What test will check on my response to  Coumadin? While on Coumadin (warfarin) you will need to have an INR test regularly to ensure that your dose is keeping you in the desired range. The INR (international normalized ratio) number is calculated from the result of the laboratory test called prothrombin time (PT).  If an INR APPOINTMENT HAS NOT ALREADY BEEN MADE FOR YOU please schedule an appointment to have this lab work done by your health care provider within 7 days. Your INR goal is usually a number between:  2 to 3 or your provider may give you a more narrow range like 2-2.5.  Ask your health care provider during an office visit what your goal INR is.  What  do you need to  know  About  COUMADIN? Take Coumadin (warfarin) exactly as prescribed by your healthcare provider about the same time each day.  DO NOT stop taking without talking to the doctor who prescribed the medication.  Stopping without other blood clot prevention medication to take the place of Coumadin may increase your risk of developing a new clot or stroke.  Get refills before you run out.  What do you do if you miss a dose? If you miss a dose, take it as soon as you remember on the same day then continue your regularly scheduled regimen the next day.  Do not take two doses of Coumadin at the same time.  Important Safety Information A possible side effect of Coumadin (Warfarin) is an increased risk of bleeding. You should call your healthcare provider right away if you experience any of the following: ? Bleeding from an injury or your nose that does not stop. ? Unusual colored urine (red or dark brown) or unusual  colored stools (red or black). ? Unusual bruising for unknown reasons. ? A serious fall or if you hit your head (even if there is no bleeding).  Some foods or medicines interact with Coumadin (warfarin) and might alter your response to warfarin. To help avoid this: ? Eat a balanced diet, maintaining a consistent amount of Vitamin K. ? Notify your provider  about major diet changes you plan to make. ? Avoid alcohol or limit your intake to 1 drink for women and 2 drinks for men per day. (1 drink is 5 oz. wine, 12 oz. beer, or 1.5 oz. liquor.)  Make sure that ANY health care provider who prescribes medication for you knows that you are taking Coumadin (warfarin).  Also make sure the healthcare provider who is monitoring your Coumadin knows when you have started a new medication including herbals and non-prescription products.  Coumadin (Warfarin)  Major Drug Interactions  Increased Warfarin Effect Decreased Warfarin Effect  Alcohol (large quantities) Antibiotics (esp. Septra/Bactrim, Flagyl, Cipro) Amiodarone (Cordarone) Aspirin (ASA) Cimetidine (Tagamet) Megestrol (Megace) NSAIDs (ibuprofen, naproxen, etc.) Piroxicam (Feldene) Propafenone (Rythmol SR) Propranolol (Inderal) Isoniazid (INH) Posaconazole (Noxafil) Barbiturates (Phenobarbital) Carbamazepine (Tegretol) Chlordiazepoxide (Librium) Cholestyramine (Questran) Griseofulvin Oral Contraceptives Rifampin Sucralfate (Carafate) Vitamin K   Coumadin (Warfarin) Major Herbal Interactions  Increased Warfarin Effect Decreased Warfarin Effect  Garlic Ginseng Ginkgo biloba Coenzyme Q10 Green tea St. Johns wort    Coumadin (Warfarin) FOOD Interactions  Eat a consistent number of servings per week of foods HIGH in Vitamin K (1 serving =  cup)  Collards (cooked, or boiled & drained) Kale (cooked, or boiled & drained) Mustard greens (cooked, or boiled & drained) Parsley *serving size only =  cup Spinach (cooked, or boiled & drained) Swiss chard (cooked, or boiled & drained) Turnip greens (cooked, or boiled & drained)  Eat a consistent number of servings per week of foods MEDIUM-HIGH in Vitamin K (1 serving = 1 cup)  Asparagus (cooked, or boiled & drained) Broccoli (cooked, boiled & drained, or raw & chopped) Brussel sprouts (cooked, or boiled & drained) *serving size only =   cup Lettuce, raw (green leaf, endive, romaine) Spinach, raw Turnip greens, raw & chopped   These websites have more information on Coumadin (warfarin):  http://www.king-russell.com/www.coumadin.com; https://www.hines.net/www.ahrq.gov/consumer/coumadin.htm;

## 2018-02-10 NOTE — Progress Notes (Signed)
Physical Therapy Session Note  Patient Details  Name: Nathan PhilipsRandleman D Hair Jr. MRN: [id number] Date of Birth: 04-Jul-1945  Today's Date: 02/10/2018 PT Individual Time: 1345-1500 PT Individual Time Calculation (min): 75 min   Short Term Goals: Week 1:  PT Short Term Goal 1 (Week 1): Pt will complete bed mobility with min A with good adherence to sternal precautions PT Short Term Goal 2 (Week 1): Pt will complete transfer with LRAD and mod A consistently PT Short Term Goal 3 (Week 1): Pt will initiate gait training  Skilled Therapeutic Interventions/Progress Updates: Pt received in bed, wife present, c/o pain as below and agreeable to treatment. Supine>sit modA with HOB elevated. Stand pivot transfer minA to R into w/c without AD. Attempted w/c propulsion with BLE; unable d/t LLE weakness. Gait x2 trials at 10-15' each with min/modA and RW; cues for L step length, tactile cues for L glute activation in stance. Pt folded laundry at tabletop for focus on UE endurance and gross/fine motor control. Stand pivot w/c <>nustep minA. Performed BLE nustep level 1 x6 min total; prolonged rest break at 3 min d/t fatigue. Returned to room totalA. Stand pivot to bed with minA and RW. Sit >supine minA for LE management. Remained in bed, alarm intact, four bedrails per pt request, all needs in reach.   x    Therapy Documentation Precautions:  Precautions Precautions: Fall, Sternal Restrictions Weight Bearing Restrictions: No Other Position/Activity Restrictions: sternal precautions Pain: Pain Assessment Pain Scale: 0-10 Pain Score: 6  Pain Type: Acute pain Pain Location: Back Pain Descriptors / Indicators: Aching Pain Frequency: Intermittent Pain Onset: Gradual Pain Intervention(s): Medication (See eMAR)   See Function Navigator for Current Functional Status.   Therapy/Group: Individual Therapy  [name] Faithann Natal 02/10/2018, 3:02 PM

## 2018-02-11 ENCOUNTER — Inpatient Hospital Stay (HOSPITAL_COMMUNITY): Payer: Self-pay

## 2018-02-11 LAB — GLUCOSE, CAPILLARY
GLUCOSE-CAPILLARY: 131 mg/dL — AB (ref 70–99)
GLUCOSE-CAPILLARY: 118 mg/dL — AB (ref 70–99)
GLUCOSE-CAPILLARY: 158 mg/dL — AB (ref 70–99)
Glucose-Capillary: 148 mg/dL — ABNORMAL HIGH (ref 70–99)

## 2018-02-11 LAB — PROTIME-INR
INR: 3.47
Prothrombin Time: 34.6 seconds — ABNORMAL HIGH (ref 11.4–15.2)

## 2018-02-11 MED ORDER — WARFARIN SODIUM 2.5 MG PO TABS: 2.5000 mg | ORAL_TABLET | Freq: Once | ORAL | Status: DC

## 2018-02-11 NOTE — Progress Notes (Signed)
Pt slept the first 8 hours of the shift.

## 2018-02-11 NOTE — Progress Notes (Signed)
Speech Language Pathology Daily Session Note  Patient Details  Name: Nathan [name] D Bowring Jr. MRN: [id number] Date of Birth: Apr 29, 1946  Today's Date: 02/11/2018 SLP Individual Time: 1430-1500 SLP Individual Time Calculation (min): 30 min  Short Term Goals: Week 2: SLP Short Term Goal 1 (Week 2): Patient will complete semi-complex problem solving tasks with Min A verbal cues.  SLP Short Term Goal 2 (Week 2): Patient will demonstrate recall of daily information with Min A verbal cues for use of compensatory strategies.  SLP Short Term Goal 3 (Week 2): Patient will demonstrate selective attention in a moderately distracting enviornment for ~45 minutes with supervision verbal cues.  SLP Short Term Goal 4 (Week 2): Patient will self-monitor and correct errors during functional tasks with Min A verbal cues.   Skilled Therapeutic Interventions: Skilled treatment session focused on cognition goals. SLP facilitated today's session by providing Min A verbal cues to complete functional, math counting task, though required frequent verbal cues to recall the correct amount of money to be given and Min-Mod A verbal cues to self-monitor and correct errors. He recalled lunch items and topics completed in speech therapy with Min A verbal cues with use of questioning cues, in particular. Improved attention to task; no off-topic or tangential speech was noted during today's session. Pt was left upright in bed, bed alarm on and all needs within reach. Continue per current plan of care.   Function:  Cognition Comprehension Comprehension assist level: Follows basic conversation/direction with extra time/assistive device  Expression   Expression assist level: Expresses basic needs/ideas: With extra time/assistive device  Social Interaction Social Interaction assist level: Interacts appropriately 75 - 89% of the time - Needs redirection for appropriate language or to initiate interaction.  Problem Solving Problem  solving assist level: Solves basic 90% of the time/requires cueing < 10% of the time  Memory Memory assist level: Recognizes or recalls 75 - 89% of the time/requires cueing 10 - 24% of the time    Pain Pain Assessment Pain Scale: 0-10 Pain Score: 0-No pain  Therapy/Group: Individual Therapy  Rhondalyn Clingan A Aubre Quincy 02/11/2018, 3:21 PM

## 2018-02-11 NOTE — Progress Notes (Addendum)
ANTICOAGULATION CONSULT NOTE  Pharmacy Consult for warfarin Indication: atrial fibrillation  Allergies  Allergen Reactions  . Fentanyl Shortness Of Breath  . Promethazine Hcl Anaphylaxis and Other (See Comments)    Cardiac arrest  . Foeniculum Vulgare Other (See Comments)    Fennel bulbs--nausea only  . Metformin And Related Diarrhea  . Penicillins Nausea Only    Has patient had a PCN reaction causing immediate rash, facial/tongue/throat swelling, SOB or lightheadedness with hypotension: no Has patient had a PCN reaction causing severe rash involving mucus membranes or skin necrosis: no Has patient had a PCN reaction that required hospitalization: no Has patient had a PCN reaction occurring within the last 10 years: yes If all of the above answers are "NO", then may proceed with Cephalosporin use.   . Sulfa Antibiotics Other (See Comments)    G.I. Upset    Patient Measurements: Height: 5' 11.5" (181.6 cm) Weight: 190 lb 11.2 oz (86.5 kg) IBW/kg (Calculated) : 76.45  Vital Signs: Temp: 98.4 F (36.9 C) (08/17 0553) Temp Source: Oral (08/17 0553) BP: 106/63 (08/17 0553) Pulse Rate: 73 (08/17 0553)  Labs: Recent Labs    02/09/18 0454 02/10/18 0556 02/11/18 0527  HGB 9.2*  --   --   HCT 28.1*  --   --   PLT 317  --   --   LABPROT 34.4* 36.4* 34.6*  INR 3.44 3.70 3.47  CREATININE 0.88  --   --     Estimated Creatinine Clearance: 85.7 mL/min (by C-G formula based on SCr of 0.88 mg/dL).   Medical History: Past Medical History:  Diagnosis Date  . Atrial fibrillation with RVR (HCC) 05/01/2017  . Cardiomyopathy (HCC) 05/01/2017  . CHF (congestive heart failure) (HCC)   . 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 Coronary artery disease   . DM (diabetes mellitus) type 2, uncontrolled, with ketoacidosis (HCC) 05/01/2017  . Essential hypertension 11/26/2016  . History of kidney stones   . HLD (hyperlipidemia) 05/01/2017  . IVCD  (intraventricular conduction defect) 04/24/2015  . Mitral valve regurgitation   . Mixed dyslipidemia 11/26/2016  . 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 Pharmacy consulted to manage warfarin dosing.  INR today trending down but still supratherapeutic at 3.47. Warfarin doses held since 8/14. Patient reporting hematuria this morning but no clots. No other documented bleeding. No CBC this AM. Given ongoing hematuria and supratherapeutic INR, will hold warfarin again this evening and reassess tomorrow.   Goal of Therapy:  INR 2-3 Monitor platelets by anticoagulation protocol: Yes   Plan:  Hold warfarin today  Monitor daily PT/INR   Harlow MaresAmy Adamae Ricklefs, PharmD PGY1 Pharmacy Resident Phone [phone]  02/11/2018   8:34 AM

## 2018-02-11 NOTE — Progress Notes (Signed)
Subjective/Complaints: No issues overnite, still has some blood in urine but no clots ROS  Neg CP, SOB, N/V/D Objective: Vital Signs: Blood pressure 106/63, pulse 73, temperature 98.4 F (36.9 C), temperature source Oral, resp. rate 18, height 5' 11.5" (1.816 m), weight 86.5 kg, SpO2 98 %. No results found. Results for orders placed or performed during the hospital encounter of 02/03/18 (from the past 72 hour(s))  Glucose, capillary     Status: Abnormal   Collection Time: 02/08/18 11:34 AM  Result Value Ref Range   Glucose-Capillary 156 (H) 70 - 99 mg/dL  Glucose, capillary     Status: Abnormal   Collection Time: 02/08/18  4:36 PM  Result Value Ref Range   Glucose-Capillary 169 (H) 70 - 99 mg/dL  Glucose, capillary     Status: Abnormal   Collection Time: 02/08/18  9:04 PM  Result Value Ref Range   Glucose-Capillary 153 (H) 70 - 99 mg/dL  Protime-INR     Status: Abnormal   Collection Time: 02/09/18  4:54 AM  Result Value Ref Range   Prothrombin Time 34.4 (H) 11.4 - 15.2 seconds   INR 3.44     Comment: Performed at [facility], [place] [address], [place], [place] [address]  Basic metabolic panel     Status: Abnormal   Collection Time: 02/09/18  4:54 AM  Result Value Ref Range   Sodium 133 (L) 135 - 145 mmol/L   Potassium 4.9 3.5 - 5.1 mmol/L   Chloride 97 (L) 98 - 111 mmol/L   CO2 29 22 - 32 mmol/L   Glucose, Bld 102 (H) 70 - 99 mg/dL   BUN 17 8 - 23 mg/dL   Creatinine, Ser 0.88 0.61 - 1.24 mg/dL   Calcium 8.6 (L) 8.9 - 10.3 mg/dL   GFR calc non Af Amer >60 >60 mL/min   GFR calc Af Amer >60 >60 mL/min    Comment: (NOTE) The eGFR has been calculated using the CKD EPI equation. This calculation has not been validated in all clinical situations. eGFR's persistently <60 mL/min signify possible Chronic Kidney Disease.    Anion gap 7 5 - 15    Comment: Performed at [facility] [address], [place], [place] [address]  CBC     Status: Abnormal   Collection  Time: 02/09/18  4:54 AM  Result Value Ref Range   WBC 6.2 4.0 - 10.5 K/uL   RBC 3.05 (L) 4.22 - 5.81 MIL/uL   Hemoglobin 9.2 (L) 13.0 - 17.0 g/dL   HCT 28.1 (L) 39.0 - 52.0 %   MCV 92.1 78.0 - 100.0 fL   MCH 30.2 26.0 - 34.0 pg   MCHC 32.7 30.0 - 36.0 g/dL   RDW 12.9 11.5 - 15.5 %   Platelets 317 150 - 400 K/uL    Comment: Performed at [facility], [place] [address] [place], [place] [address]  Glucose, capillary     Status: None   Collection Time: 02/09/18  6:15 AM  Result Value Ref Range   Glucose-Capillary 92 70 - 99 mg/dL  Glucose, capillary     Status: Abnormal   Collection Time: 02/09/18 11:44 AM  Result Value Ref Range   Glucose-Capillary 132 (H) 70 - 99 mg/dL  Glucose, capillary     Status: Abnormal   Collection Time: 02/09/18  4:39 PM  Result Value Ref Range   Glucose-Capillary 150 (H) 70 - 99 mg/dL  Glucose, capillary     Status: Abnormal  Collection Time: 02/09/18  9:22 PM  Result Value Ref Range   Glucose-Capillary 149 (H) 70 - 99 mg/dL  Protime-INR     Status: Abnormal   Collection Time: 02/10/18  5:56 AM  Result Value Ref Range   Prothrombin Time 36.4 (H) 11.4 - 15.2 seconds   INR 3.70     Comment: Performed at [facility], [place] [address] [place], [place] [address]  Glucose, capillary     Status: Abnormal   Collection Time: 02/10/18  6:02 AM  Result Value Ref Range   Glucose-Capillary 131 (H) 70 - 99 mg/dL  Glucose, capillary     Status: Abnormal   Collection Time: 02/10/18 11:54 AM  Result Value Ref Range   Glucose-Capillary 148 (H) 70 - 99 mg/dL  Glucose, capillary     Status: Abnormal   Collection Time: 02/10/18  4:41 PM  Result Value Ref Range   Glucose-Capillary 117 (H) 70 - 99 mg/dL  Glucose, capillary     Status: Abnormal   Collection Time: 02/10/18 10:23 PM  Result Value Ref Range   Glucose-Capillary 114 (H) 70 - 99 mg/dL   Comment 1 Notify RN   Protime-INR     Status: Abnormal   Collection Time: 02/11/18  5:27 AM  Result Value  Ref Range   Prothrombin Time 34.6 (H) 11.4 - 15.2 seconds   INR 3.47     Comment: Performed at [facility], [place] [address], [place], [place] [address]  Glucose, capillary     Status: Abnormal   Collection Time: 02/11/18  6:37 AM  Result Value Ref Range   Glucose-Capillary 131 (H) 70 - 99 mg/dL   Comment 1 Notify RN      HEENT: edendulous Cardio: RRR and no murmur Resp: CTA B/L and unlabored GI: BS positive and Non tender Extremity:  No Edema Skin:   Intact and Wound C/D/I and sternotomy and R SVG site Neuro: Alert/Oriented, Normal Sensory, Abnormal Motor 4/5 LUE and LLE, 5/5 on Right side and Abnormal FMC Ataxic/ dec FMC Musc/Skel:  Other No pain with ROM Gen NAD   Assessment/Plan: 1. Functional deficits secondary to RIght MCA infarct  which require 3+ hours per day of interdisciplinary therapy in a comprehensive inpatient rehab setting. Physiatrist is providing close team supervision and 24 hour management of active medical problems listed below. Physiatrist and rehab team continue to assess barriers to discharge/monitor patient progress toward functional and medical goals. FIM: Function - Bathing Position: Wheelchair/chair at sink Body parts bathed by patient: Right arm, Left arm, Chest, Abdomen, Front perineal area, Buttocks, Right upper leg, Left upper leg, Right lower leg, Left lower leg Body parts bathed by helper: Back Bathing not applicable: Front perineal area, Buttocks, Right upper leg, Left upper leg, Right lower leg, Left lower leg(Declined LB bathing) Assist Level: Touching or steadying assistance(Pt > 75%)  Function- Upper Body Dressing/Undressing What is the patient wearing?: Pull over shirt/dress Pull over shirt/dress - Perfomed by patient: Thread/unthread right sleeve, Thread/unthread left sleeve, Pull shirt over trunk, Put head through opening Pull over shirt/dress - Perfomed by helper: Put head through opening, Pull shirt over trunk Assist Level:  Supervision or verbal cues Function - Lower Body Dressing/Undressing What is the patient wearing?: Pants, Shoes Position: Wheelchair/chair at sink Pants- Performed by patient: Thread/unthread right pants leg, Thread/unthread left pants leg, Pull pants up/down Pants- Performed by helper: Thread/unthread right pants leg, Thread/unthread left pants leg, Pull pants up/down Non-skid slipper socks- Performed by patient:  Don/doff right sock, Don/doff left sock Non-skid slipper socks- Performed by helper: Don/doff right sock, Don/doff left sock Shoes - Performed by patient: Don/doff right shoe, Don/doff left shoe Shoes - Performed by helper: Fasten right, Fasten left TED Hose - Performed by helper: Don/doff right TED hose, Don/doff left TED hose Assist for footwear: Supervision/touching assist Assist for lower body dressing: Touching or steadying assistance (Pt > 75%)  Function - Toileting Toileting activity did not occur: No continent bowel/bladder event Toileting steps completed by helper: Performs perineal hygiene, Adjust clothing after toileting Toileting Assistive Devices: Other (comment)(foley cath) Assist level: Touching or steadying assistance (Pt.75%)  Function - Toilet Transfers Toilet transfer activity did not occur: Refused Toilet transfer assistive device: Bedside commode, Walker Assist level to toilet: Moderate assist (Pt 50 - 74%/lift or lower) Assist level from toilet: Moderate assist (Pt 50 - 74%/lift or lower) Assist level to bedside commode (at bedside): Touching or steadying assistance (Pt > 75%) Assist level from bedside commode (at bedside): Touching or steadying assistance (Pt > 75%)  Function - Chair/bed transfer Chair/bed transfer method: Ambulatory Chair/bed transfer assist level: Touching or steadying assistance (Pt > 75%) Chair/bed transfer assistive device: Walker Chair/bed transfer details: Verbal cues for sequencing, Verbal cues for technique, Verbal cues for  precautions/safety, Manual facilitation for weight shifting, Manual facilitation for placement  Function - Locomotion: Wheelchair Will patient use wheelchair at discharge?: No Function - Locomotion: Ambulation Ambulation activity did not occur: Safety/medical concerns Assistive device: Walker-rolling Max distance: 20 Assist level: Touching or steadying assistance (Pt > 75%) Assist level: Touching or steadying assistance (Pt > 75%)  Function - Comprehension Comprehension: Auditory Comprehension assist level: Follows basic conversation/direction with extra time/assistive device  Function - Expression Expression: Verbal Expression assist level: Expresses basic needs/ideas: With extra time/assistive device  Function - Social Interaction Social Interaction assist level: Interacts appropriately 90% of the time - Needs monitoring or encouragement for participation or interaction.  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 DVT Prophylaxis/Anticoagulation: Pharmaceutical:Coumadin per pharm protocol, INR elevated, no therapy restriction 3. Pain Management:prn tylenol. Oxycodone or tramadol for more severe pain 4. Mood:LCSW to follow for evaluation and support. 5. Neuropsych: This patientis not fullycapable of making decisions on hisown behalf. 6. Skin/Wound Care:incisions look good Add protein supplements between meals 7. Fluids/Electrolytes/Nutrition:Strict I/O. Intake  poor due to recent teeth extraction. Check lytes in am.  8. CAD s/p CABG with MVR: Monitor for symptoms with activity. ON coumadin and Lipitor--coreg added  9.PAF: In A fib today but rate controlled on warfarin--coreg added today.  10. R-MCA stroke with extension: Left sided weakness resolving. On coumadin.  11. Chronic systolic CHF: Pulmonary edema improving --encourage IS. Monitor weights daily with strict I/O. On Spironolactone, losartan, coreg and Lipitor.  12. H/o BPH/Acute Urinary retention: Question of delirium--will check UA/UCS. Foleywasplaced today to help decompress bladder. Continue foleyfor home D/C-hematuria persists appreciate Uro input, no clots  13. Constipation: Willincrease mirlax to bid--No BM since 8/15     14.  ABLA/ Continue to monitor H/H for recovery.   15. Hyponatremia: Continue to monitor for now.  16. T2DM: Monitor BS ac/hs. On levemir daily with SSI for tighter BS control. CBG (last 3)  Recent Labs    02/10/18 1641 02/10/18 2223 02/11/18 0637  GLUCAP 117* 114* 131*  Controlled 8/17    A FACE TO FACE EVALUATION WAS PERFORMED  Luanna Salk Lucas Exline 02/11/2018, 8:05 AM

## 2018-02-11 NOTE — Plan of Care (Signed)
  Problem: RH SAFETY Goal: RH STG ADHERE TO SAFETY PRECAUTIONS W/ASSISTANCE/DEVICE Description STG Adhere to Safety Precautions With min Assistance/Device.  Outcome: Progressing  Call light within reach, bed alarm  

## 2018-02-12 ENCOUNTER — Inpatient Hospital Stay (HOSPITAL_COMMUNITY): Payer: Self-pay | Admitting: Occupational Therapy

## 2018-02-12 LAB — BASIC METABOLIC PANEL
ANION GAP: 5 (ref 5–15)
BUN: 13 mg/dL (ref 8–23)
CALCIUM: 8.7 mg/dL — AB (ref 8.9–10.3)
CHLORIDE: 101 mmol/L (ref 98–111)
CO2: 27 mmol/L (ref 22–32)
CREATININE: 1.18 mg/dL (ref 0.61–1.24)
GFR calc non Af Amer: >60 mL/min (ref >60)
Glucose, Bld: 105 mg/dL — ABNORMAL HIGH (ref 70–99)
Potassium: 4.9 mmol/L (ref 3.5–5.1)
SODIUM: 133 mmol/L — AB (ref 135–145)

## 2018-02-12 LAB — GLUCOSE, CAPILLARY
GLUCOSE-CAPILLARY: 102 mg/dL — AB (ref 70–99)
GLUCOSE-CAPILLARY: 128 mg/dL — AB (ref 70–99)
Glucose-Capillary: 138 mg/dL — ABNORMAL HIGH (ref 70–99)
Glucose-Capillary: 113 mg/dL — ABNORMAL HIGH (ref 70–99)

## 2018-02-12 LAB — CBC
HCT: 31.3 % — ABNORMAL LOW (ref 39.0–52.0)
HEMOGLOBIN: 10.1 g/dL — AB (ref 13.0–17.0)
MCH: 29.8 pg (ref 26.0–34.0)
MCHC: 32.3 g/dL (ref 30.0–36.0)
MCV: 92.3 fL (ref 78.0–100.0)
PLATELETS: 318 10*3/uL (ref 150–400)
RBC: 3.39 MIL/uL — AB (ref 4.22–5.81)
RDW: 13.1 % (ref 11.5–15.5)
WBC: 7.3 10*3/uL (ref 4.0–10.5)

## 2018-02-12 LAB — PROTIME-INR
INR: 2.48
Prothrombin Time: 26.7 seconds — ABNORMAL HIGH (ref 11.4–15.2)

## 2018-02-12 MED ORDER — WARFARIN SODIUM 2.5 MG PO TABS
2.5000 mg | ORAL_TABLET | Freq: Once | ORAL | Status: AC
  Administered 2018-02-12: 2.5 mg via ORAL
  Filled 2018-02-12: qty 1

## 2018-02-12 NOTE — Progress Notes (Signed)
Subjective/Complaints: No issues overnite but feels nauseated this am  ROS  Neg CP, SOB, N/V/D Objective: Vital Signs: Blood pressure 112/71, pulse 79, temperature 98.1 F (36.7 C), temperature source Oral, resp. rate 18, height 5' 11.5" (1.816 m), weight 87 kg, SpO2 98 %. No results found. Results for orders placed or performed during the hospital encounter of 02/03/18 (from the past 72 hour(s))  Glucose, capillary     Status: Abnormal   Collection Time: 02/09/18 11:44 AM  Result Value Ref Range   Glucose-Capillary 132 (H) 70 - 99 mg/dL  Glucose, capillary     Status: Abnormal   Collection Time: 02/09/18  4:39 PM  Result Value Ref Range   Glucose-Capillary 150 (H) 70 - 99 mg/dL  Glucose, capillary     Status: Abnormal   Collection Time: 02/09/18  9:22 PM  Result Value Ref Range   Glucose-Capillary 149 (H) 70 - 99 mg/dL  Protime-INR     Status: Abnormal   Collection Time: 02/10/18  5:56 AM  Result Value Ref Range   Prothrombin Time 36.4 (H) 11.4 - 15.2 seconds   INR 3.70     Comment: Performed at [facility], [place] [address] [place], [place] [address]  Glucose, capillary     Status: Abnormal   Collection Time: 02/10/18  6:02 AM  Result Value Ref Range   Glucose-Capillary 131 (H) 70 - 99 mg/dL  Glucose, capillary     Status: Abnormal   Collection Time: 02/10/18 11:54 AM  Result Value Ref Range   Glucose-Capillary 148 (H) 70 - 99 mg/dL  Glucose, capillary     Status: Abnormal   Collection Time: 02/10/18  4:41 PM  Result Value Ref Range   Glucose-Capillary 117 (H) 70 - 99 mg/dL  Glucose, capillary     Status: Abnormal   Collection Time: 02/10/18 10:23 PM  Result Value Ref Range   Glucose-Capillary 114 (H) 70 - 99 mg/dL   Comment 1 Notify RN   Protime-INR     Status: Abnormal   Collection Time: 02/11/18  5:27 AM  Result Value Ref Range   Prothrombin Time 34.6 (H) 11.4 - 15.2 seconds   INR 3.47     Comment: Performed at [facility], [place] [address],  [place], [place] [address]  Glucose, capillary     Status: Abnormal   Collection Time: 02/11/18  6:37 AM  Result Value Ref Range   Glucose-Capillary 131 (H) 70 - 99 mg/dL   Comment 1 Notify RN   Glucose, capillary     Status: Abnormal   Collection Time: 02/11/18 11:30 AM  Result Value Ref Range   Glucose-Capillary 148 (H) 70 - 99 mg/dL  Glucose, capillary     Status: Abnormal   Collection Time: 02/11/18  4:35 PM  Result Value Ref Range   Glucose-Capillary 118 (H) 70 - 99 mg/dL  Glucose, capillary     Status: Abnormal   Collection Time: 02/11/18  9:03 PM  Result Value Ref Range   Glucose-Capillary 158 (H) 70 - 99 mg/dL  Protime-INR     Status: Abnormal   Collection Time: 02/12/18  5:18 AM  Result Value Ref Range   Prothrombin Time 26.7 (H) 11.4 - 15.2 seconds   INR 2.48     Comment: Performed at [facility], [place] [address], [place], [place] [address]  Basic metabolic panel     Status: Abnormal   Collection Time: 02/12/18  5:18 AM  Result Value Ref Range  Sodium 133 (L) 135 - 145 mmol/L   Potassium 4.9 3.5 - 5.1 mmol/L   Chloride 101 98 - 111 mmol/L   CO2 27 22 - 32 mmol/L   Glucose, Bld 105 (H) 70 - 99 mg/dL   BUN 13 8 - 23 mg/dL   Creatinine, Ser 1.18 0.61 - 1.24 mg/dL   Calcium 8.7 (L) 8.9 - 10.3 mg/dL   GFR calc non Af Amer >60 >60 mL/min   GFR calc Af Amer >60 >60 mL/min    Comment: (NOTE) The eGFR has been calculated using the CKD EPI equation. This calculation has not been validated in all clinical situations. eGFR's persistently <60 mL/min signify possible Chronic Kidney Disease.    Anion gap 5 5 - 15    Comment: Performed at [facility] [address], [place], [place] [address]  CBC     Status: Abnormal   Collection Time: 02/12/18  5:18 AM  Result Value Ref Range   WBC 7.3 4.0 - 10.5 K/uL   RBC 3.39 (L) 4.22 - 5.81 MIL/uL   Hemoglobin 10.1 (L) 13.0 - 17.0 g/dL   HCT 31.3 (L) 39.0 - 52.0 %   MCV 92.3 78.0 - 100.0 fL   MCH 29.8 26.0 - 34.0 pg    MCHC 32.3 30.0 - 36.0 g/dL   RDW 13.1 11.5 - 15.5 %   Platelets 318 150 - 400 K/uL    Comment: Performed at [facility], [place] [address] [place], [place] [address]  Glucose, capillary     Status: Abnormal   Collection Time: 02/12/18  6:29 AM  Result Value Ref Range   Glucose-Capillary 102 (H) 70 - 99 mg/dL     HEENT: edendulous Cardio: RRR and no murmur Resp: CTA B/L and unlabored GI: BS positive and mild RLQ, LLQ  tenderness Extremity:  No Edema Skin:   Intact and Wound C/D/I and sternotomy and R SVG site Neuro: Alert/Oriented, Normal Sensory, Abnormal Motor 4/5 LUE and LLE, 5/5 on Right side and Abnormal FMC Ataxic/ dec FMC Musc/Skel:  Other No pain with ROM Gen NAD GU foley draining amber color urine  Assessment/Plan: 1. Functional deficits secondary to RIght MCA infarct  which require 3+ hours per day of interdisciplinary therapy in a comprehensive inpatient rehab setting. Physiatrist is providing close team supervision and 24 hour management of active medical problems listed below. Physiatrist and rehab team continue to assess barriers to discharge/monitor patient progress toward functional and medical goals. FIM: Function - Bathing Position: Wheelchair/chair at sink Body parts bathed by patient: Right arm, Left arm, Chest, Abdomen, Front perineal area, Buttocks, Right upper leg, Left upper leg, Right lower leg, Left lower leg Body parts bathed by helper: Back Bathing not applicable: Front perineal area, Buttocks, Right upper leg, Left upper leg, Right lower leg, Left lower leg(Declined LB bathing) Assist Level: Touching or steadying assistance(Pt > 75%)  Function- Upper Body Dressing/Undressing What is the patient wearing?: Pull over shirt/dress Pull over shirt/dress - Perfomed by patient: Thread/unthread right sleeve, Thread/unthread left sleeve, Pull shirt over trunk, Put head through opening Pull over shirt/dress - Perfomed by helper: Put head through opening, Pull  shirt over trunk Assist Level: Supervision or verbal cues Function - Lower Body Dressing/Undressing What is the patient wearing?: Pants, Shoes Position: Wheelchair/chair at sink Pants- Performed by patient: Thread/unthread right pants leg, Thread/unthread left pants leg, Pull pants up/down Pants- Performed by helper: Thread/unthread right pants leg, Thread/unthread left pants leg, Pull pants up/down Non-skid  slipper socks- Performed by patient: Don/doff right sock, Don/doff left sock Non-skid slipper socks- Performed by helper: Don/doff right sock, Don/doff left sock Shoes - Performed by patient: Don/doff right shoe, Don/doff left shoe Shoes - Performed by helper: Fasten right, Fasten left TED Hose - Performed by helper: Don/doff right TED hose, Don/doff left TED hose Assist for footwear: Supervision/touching assist Assist for lower body dressing: Touching or steadying assistance (Pt > 75%)  Function - Toileting Toileting activity did not occur: No continent bowel/bladder event Toileting steps completed by helper: Performs perineal hygiene, Adjust clothing after toileting Toileting Assistive Devices: Other (comment)(foley cath) Assist level: Touching or steadying assistance (Pt.75%)  Function - Toilet Transfers Toilet transfer activity did not occur: Refused Toilet transfer assistive device: Bedside commode, Walker Assist level to toilet: Moderate assist (Pt 50 - 74%/lift or lower) Assist level from toilet: Moderate assist (Pt 50 - 74%/lift or lower) Assist level to bedside commode (at bedside): Touching or steadying assistance (Pt > 75%) Assist level from bedside commode (at bedside): Touching or steadying assistance (Pt > 75%)  Function - Chair/bed transfer Chair/bed transfer method: Ambulatory Chair/bed transfer assist level: Touching or steadying assistance (Pt > 75%) Chair/bed transfer assistive device: Walker Chair/bed transfer details: Verbal cues for sequencing, Verbal cues  for technique, Verbal cues for precautions/safety, Manual facilitation for weight shifting, Manual facilitation for placement  Function - Locomotion: Wheelchair Will patient use wheelchair at discharge?: No Function - Locomotion: Ambulation Ambulation activity did not occur: Safety/medical concerns Assistive device: Walker-rolling Max distance: 20 Assist level: Touching or steadying assistance (Pt > 75%) Assist level: Touching or steadying assistance (Pt > 75%)  Function - Comprehension Comprehension: Auditory Comprehension assist level: Follows basic conversation/direction with extra time/assistive device  Function - Expression Expression: Verbal Expression assist level: Expresses complex ideas: With extra time/assistive device  Function - Social Interaction Social Interaction assist level: Interacts appropriately 75 - 89% of the time - Needs redirection for appropriate language or to initiate interaction.  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 DVT Prophylaxis/Anticoagulation: Pharmaceutical:Coumadin per pharm protocol, INR elevated, no therapy restriction 3. Pain Management:prn tylenol. Oxycodone or tramadol for more severe pain 4. Mood:LCSW to follow for evaluation and support. 5. Neuropsych: This patientis not fullycapable of making decisions on hisown behalf. 6. Skin/Wound Care:incisions look good Add protein supplements between meals 7.  Fluids/Electrolytes/Nutrition:Strict I/O. Intake poor due to recent teeth extraction. Check lytes in am.  8. CAD s/p CABG with MVR: Monitor for symptoms with activity. ON coumadin and Lipitor--coreg added  9.PAF: In A fib today but rate controlled on warfarin--coreg added today.  10. R-MCA stroke with extension: Left sided weakness resolving. On coumadin.  11. Chronic systolic CHF: Pulmonary edema improving --encourage IS. Monitor weights daily with strict I/O. On Spironolactone, losartan, coreg and Lipitor.  12. H/o BPH/Acute Urinary retention: Question of delirium--will check UA/UCS. Foleywasplaced today to help decompress bladder. Continue foleyfor home D/C-hematuria persists appreciate Uro input, no clots  13. Constipation: Willincrease  mirlax to bid-- BM 8/17     14. ABLA/ Continue to monitor H/H for recovery.   15. Hyponatremia: Continue to monitor for now.  16. T2DM: Monitor BS ac/hs. On levemir daily with SSI for tighter BS control. CBG (last 3)  Recent Labs    02/11/18 1635 02/11/18 2103 02/12/18 0629  GLUCAP 118* 158* 102*  Controlled 8/18  17.  Nauseated this am no vomiting mild LQ abd pain- monitor, CBC and BMET ok, 2 BM yesterday after miralax increased to BID   A FACE TO Raymond E Kirsteins 02/12/2018, 8:58 AM

## 2018-02-12 NOTE — Progress Notes (Signed)
Occupational Therapy Session Note  Patient Details  Name: Nathan PhilipsRandleman D Wolfson Jr. MRN: [id number] Date of Birth: 14-Aug-1945  Today's Date: 02/12/2018 OT Individual Time: 1400-1430 OT Individual Time Calculation (min): 30 min    Short Term Goals: Week 2:  OT Short Term Goal 1 (Week 2): Pt will ambulate into bathroom with min A to increase functional ambulation abilities OT Short Term Goal 2 (Week 2): Pt will complete 2 grooming tasks in standing in order to increase functional activity tolerance and endurance OT Short Term Goal 3 (Week 2): Pt will complete full bathing/dressing task with min cuing for re-direction to task.  Skilled Therapeutic Interventions/Progress Updates:    Pt seen for OT session focusing on functional transfers, ADL re-training and functional activity tolerance. Pt sitting up in bed upon arrival with wife present, agreeable to tx session and denying pain. He transferred to EOB with min A, mod cuing for sequencing/ technique. He stood from slightly elevated EOB with min A and ambulated to sink with RW and min A. He completed grooming tasks in standing, alternating UE support for balance. Pt tolerating ~3 minutes in standing before requiring seated rest break. Shirt and pants donned seated in w/c, standing with steadying assist to pull pants up. He ambulated short distance to recliner with min A, VCs for L LE awareness. Pt left seated in recliner at end of session, chair alarm on and all needs in reach with wife present. Educaton provided throughout session regarding OT/PT/SLP goals, pt's need for 24 hr supervision at d/c and d/c planning. Pt's wife states she was under the impression pt would be able to be left home alone at d/c for short periods during the day. Educated regarding pt's current level of function, deficits and functional implications, and need for 24 hr assist upon d/c. She voiced understanding and statiing " we will work on that".   Therapy  Documentation Precautions:  Precautions Precautions: Fall, Sternal Restrictions Weight Bearing Restrictions: No Other Position/Activity Restrictions: sternal precautions Pain:   No/denies pain ADL: ADL ADL Comments: Please see functional navigator  See Function Navigator for Current Functional Status.   Therapy/Group: Individual Therapy  Cem Kosman L 02/12/2018, 6:48 AM

## 2018-02-12 NOTE — Progress Notes (Signed)
ANTICOAGULATION CONSULT NOTE  Pharmacy Consult for warfarin Indication: atrial fibrillation  Allergies  Allergen Reactions  . Fentanyl Shortness Of Breath  . Promethazine Hcl Anaphylaxis and Other (See Comments)    Cardiac arrest  . Foeniculum Vulgare Other (See Comments)    Fennel bulbs--nausea only  . Metformin And Related Diarrhea  . Penicillins Nausea Only    Has patient had a PCN reaction causing immediate rash, facial/tongue/throat swelling, SOB or lightheadedness with hypotension: no Has patient had a PCN reaction causing severe rash involving mucus membranes or skin necrosis: no Has patient had a PCN reaction that required hospitalization: no Has patient had a PCN reaction occurring within the last 10 years: yes If all of the above answers are "NO", then may proceed with Cephalosporin use.   . Sulfa Antibiotics Other (See Comments)    G.I. Upset    Patient Measurements: Height: 5' 11.5" (181.6 cm) Weight: 191 lb 12.8 oz (87 kg) IBW/kg (Calculated) : 76.45  Vital Signs: Temp: 98.1 F (36.7 C) (08/18 0831) Temp Source: Oral (08/18 0831) BP: 112/71 (08/18 0831) Pulse Rate: 79 (08/18 0831)  Labs: Recent Labs    02/10/18 0556 02/11/18 0527 02/12/18 0518  HGB  --   --  10.1*  HCT  --   --  31.3*  PLT  --   --  318  LABPROT 36.4* 34.6* 26.7*  INR 3.70 3.47 2.48  CREATININE  --   --  1.18    Estimated Creatinine Clearance: 63.9 mL/min (by C-G formula based on SCr of 1.18 mg/dL).   Medical History: Past Medical History:  Diagnosis Date  . Atrial fibrillation with RVR (HCC) 05/01/2017  . Cardiomyopathy (HCC) 05/01/2017  . CHF (congestive heart failure) (HCC)   . 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 Coronary artery disease   . DM (diabetes mellitus) type 2, uncontrolled, with ketoacidosis (HCC) 05/01/2017  . Essential hypertension 11/26/2016  . History of kidney stones   . HLD (hyperlipidemia) 05/01/2017  . IVCD  (intraventricular conduction defect) 04/24/2015  . Mitral valve regurgitation   . Mixed dyslipidemia 11/26/2016  . 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 Pharmacy consulted to manage warfarin dosing.  INR today trending down and therapeutic at 2.48. Warfarin doses have been held since 8/14. No reported hematuria or other bleeding overnight. CBC stable.  Goal of Therapy:  INR 2-3 Monitor platelets by anticoagulation protocol: Yes   Plan:  Warfarin 2.5 mg x 1 dose Monitor daily PT/INR   Harlow MaresAmy Gibbs Naugle, PharmD PGY1 Pharmacy Resident Phone [phone]  02/12/2018   9:15 AM

## 2018-02-12 NOTE — Plan of Care (Signed)
  Problem: Consults Goal: Skin Care Protocol Initiated - if Braden Score 18 or less Description If consults are not indicated, leave blank or document N/A Outcome: Progressing Goal: Diabetes Guidelines if Diabetic/Glucose > 140 Description If diabetic or lab glucose is > 140 mg/dl - Initiate Diabetes/Hyperglycemia Guidelines & Document Interventions  Outcome: Progressing   Problem: RH BOWEL ELIMINATION Goal: RH STG MANAGE BOWEL WITH ASSISTANCE Description STG Manage Bowel with min Assistance.  Outcome: Progressing   Problem: RH BLADDER ELIMINATION Goal: RH STG MANAGE BLADDER WITH ASSISTANCE Description STG Manage Bladder With mod Assistance  Outcome: Progressing Goal: RH STG MANAGE BLADDER WITH MEDICATION WITH ASSISTANCE Description STG Manage Bladder With Medication With min Assistance.  Outcome: Progressing   Problem: RH SKIN INTEGRITY Goal: RH STG SKIN FREE OF INFECTION/BREAKDOWN Description Patients skin will remain free from further infection or breakdown with mod assist.  Outcome: Progressing Goal: RH STG MAINTAIN SKIN INTEGRITY WITH ASSISTANCE Description STG Maintain Skin Integrity With mod Assistance.  Outcome: Progressing   Problem: RH SAFETY Goal: RH STG ADHERE TO SAFETY PRECAUTIONS W/ASSISTANCE/DEVICE Description STG Adhere to Safety Precautions With min Assistance/Device.  Outcome: Progressing

## 2018-02-13 ENCOUNTER — Inpatient Hospital Stay (HOSPITAL_COMMUNITY): Payer: Self-pay

## 2018-02-13 ENCOUNTER — Inpatient Hospital Stay (HOSPITAL_COMMUNITY): Payer: Self-pay | Admitting: Occupational Therapy

## 2018-02-13 ENCOUNTER — Inpatient Hospital Stay (HOSPITAL_COMMUNITY): Payer: Self-pay | Admitting: Physical Therapy

## 2018-02-13 DIAGNOSIS — E119 Type 2 diabetes mellitus without complications: Secondary | ICD-10-CM

## 2018-02-13 DIAGNOSIS — I480 Paroxysmal atrial fibrillation: Secondary | ICD-10-CM

## 2018-02-13 DIAGNOSIS — E871 Hypo-osmolality and hyponatremia: Secondary | ICD-10-CM

## 2018-02-13 DIAGNOSIS — D638 Anemia in other chronic diseases classified elsewhere: Secondary | ICD-10-CM

## 2018-02-13 DIAGNOSIS — Z8673 Personal history of transient ischemic attack (TIA), and cerebral infarction without residual deficits: Secondary | ICD-10-CM | POA: Insufficient documentation

## 2018-02-13 LAB — GLUCOSE, CAPILLARY
GLUCOSE-CAPILLARY: 110 mg/dL — AB (ref 70–99)
GLUCOSE-CAPILLARY: 109 mg/dL — AB (ref 70–99)
GLUCOSE-CAPILLARY: 124 mg/dL — AB (ref 70–99)
Glucose-Capillary: 218 mg/dL — ABNORMAL HIGH (ref 70–99)

## 2018-02-13 LAB — PROTIME-INR
INR: 2.21
Prothrombin Time: 24.3 seconds — ABNORMAL HIGH (ref 11.4–15.2)

## 2018-02-13 MED ORDER — WARFARIN SODIUM 2.5 MG PO TABS
2.5000 mg | ORAL_TABLET | Freq: Once | ORAL | Status: AC
  Administered 2018-02-13: 2.5 mg via ORAL
  Filled 2018-02-13: qty 1

## 2018-02-13 NOTE — Progress Notes (Signed)
ANTICOAGULATION CONSULT NOTE  Pharmacy Consult for warfarin Indication: atrial fibrillation  Allergies  Allergen Reactions  . Fentanyl Shortness Of Breath  . Promethazine Hcl Anaphylaxis and Other (See Comments)    Cardiac arrest  . Foeniculum Vulgare Other (See Comments)    Fennel bulbs--nausea only  . Metformin And Related Diarrhea  . Penicillins Nausea Only    Has patient had a PCN reaction causing immediate rash, facial/tongue/throat swelling, SOB or lightheadedness with hypotension: no Has patient had a PCN reaction causing severe rash involving mucus membranes or skin necrosis: no Has patient had a PCN reaction that required hospitalization: no Has patient had a PCN reaction occurring within the last 10 years: yes If all of the above answers are "NO", then may proceed with Cephalosporin use.   . Sulfa Antibiotics Other (See Comments)    G.I. Upset    Patient Measurements: Height: 5' 11.5" (181.6 cm) Weight: 192 lb 10.9 oz (87.4 kg) IBW/kg (Calculated) : 76.45  Vital Signs: Temp: 98.2 F (36.8 C) (08/19 0432) Temp Source: Oral (08/19 0432) BP: 108/60 (08/19 0830) Pulse Rate: 78 (08/19 0830)  Labs: Recent Labs    02/11/18 0527 02/12/18 0518 02/13/18 0708  HGB  --  10.1*  --   HCT  --  31.3*  --   PLT  --  318  --   LABPROT 34.6* 26.7* 24.3*  INR 3.47 2.48 2.21  CREATININE  --  1.18  --     Estimated Creatinine Clearance: 63.9 mL/min (by C-G formula based on SCr of 1.18 mg/dL).   Medical History: Past Medical History:  Diagnosis Date  . Atrial fibrillation with RVR (HCC) 05/01/2017  . Cardiomyopathy (HCC) 05/01/2017  . CHF (congestive heart failure) (HCC)   . 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 Coronary artery disease   . DM (diabetes mellitus) type 2, uncontrolled, with ketoacidosis (HCC) 05/01/2017  . Essential hypertension 11/26/2016  . History of kidney stones   . HLD (hyperlipidemia) 05/01/2017  . IVCD  (intraventricular conduction defect) 04/24/2015  . Mitral valve regurgitation   . Mixed dyslipidemia 11/26/2016  . 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 Pharmacy consulted to manage warfarin dosing.  INR today remains therapeutic at 2.21. Warfarin doses were recently held 8/14 thru 8/17 due to INR >3.0.  CBC stable yesterday.  Recent hematuria -urine color improved to amber; urine appearance is "clear; blood clots",  previous days noted as bloody.   Goal of Therapy:  INR 2-3 Monitor platelets by anticoagulation protocol: Yes   Plan:  Warfarin 2.5 mg x 1 dose Monitor daily PT/INR   Nathan [name] Nathan [name], RPh Clinical Pharmacist Pager: [phone] Please check [facility] for all [facility] phone numbers After 10:00 PM, call Main Pharmacy [phone]  02/13/2018   9:36 AM

## 2018-02-13 NOTE — Progress Notes (Signed)
Occupational Therapy Session Note  Patient Details  Name: Nathan [name] D Deshaies Jr. MRN: [id number] Date of Birth: [birth year]  Today's Date: 02/13/2018 OT Individual Time: [id number] OT Individual Time Calculation (min): 70 min    Short Term Goals: Week 2:  OT Short Term Goal 1 (Week 2): Pt will ambulate into bathroom with min A to increase functional ambulation abilities OT Short Term Goal 2 (Week 2): Pt will complete 2 grooming tasks in standing in order to increase functional activity tolerance and endurance OT Short Term Goal 3 (Week 2): Pt will complete full bathing/dressing task with min cuing for re-direction to task.  Skilled Therapeutic Interventions/Progress Updates:    Pt seen for OT ADL bathing/dressing session. Pt awake in supine upon arrival, voiced feeling "better than I have in a long time" and agreeable to tx session. With increased time, he was able to recall plan for showering during this session. He transferred to EOB with supervision, max cuing for sequencing/technique. He ambulated with RW into bathroom, initially min A, however, progressing to mod A with max cuing for sequencing and RW management when backing into shower stall. He bathed seated on BSC in shower, crossing LEs into figure four position in order to wash buttock. Pt with incontinent soft BM while in shower, aware he was having BM. Total A for hygiene following void. Completed stand pivot transfer [facility]. He dressed seated in w/c, required VCs for redirection to task and rest breaks throughout due to decreased functional activity tolerance.  Grooming tasks completed from w/c level at sink. Pt donned shaving cream as part of B UE use, however, therapist required to assist for thoroughness of application prior to therapist performing shaving task total A for safety. Pt attempted to shave some with dominant though impaired L UE. Pt demonstrates decreased ability to provide needed pressure needed to  shave fully. He also required VCs for attention to task for safety. Pt desiring to stay up in w/c at end of session, left seated in w/c with chair belt on and all needs in reach, reviewed use of call bell for assist.   Therapy Documentation Precautions:  Precautions Precautions: Fall, Sternal Restrictions Weight Bearing Restrictions: No Other Position/Activity Restrictions: sternal precautions Pain:   No/denies pain ADL: ADL ADL Comments: Please see functional navigator  See Function Navigator for Current Functional Status.   Therapy/Group: Individual Therapy  Delmore Sear L 02/13/2018, 6:59 AM

## 2018-02-13 NOTE — Progress Notes (Signed)
Physical Therapy Weekly Progress Note  Patient Details  Name: Nathan [name]. MRN: [id number] Date of Birth: 17-Jul-1945  Beginning of progress report period: February 04, 2018 End of progress report period: February 13, 2018  Today's Date: 02/13/2018 PT Individual Time: 1030-1130 PT Individual Time Calculation (min): 60 min   Patient has met 2 of 3 short term goals.  Pt currently requires modA for bed mobility, minA sit >stand and stand pivot transfers, min/modA gait 15-20' maximum with RW, limited by L hemiparesis and reduced aerobic endurance. Continues to be limited by hemiparesis, generalized deconditioning. Pt limited during previous week d/t bowel issues limiting participation in therapy. Will require family education prior to d/c to train family on   Patient continues to demonstrate the following deficits muscle weakness, decreased cardiorespiratoy endurance and decreased standing balance, decreased postural control, hemiplegia, decreased balance strategies and difficulty maintaining precautions and therefore will continue to benefit from skilled PT intervention to increase functional independence with mobility.  Patient progressing toward long term goals..  Continue plan of care.  PT Short Term Goals Week 1:  PT Short Term Goal 1 (Week 1): Pt will complete bed mobility with min A with good adherence to sternal precautions PT Short Term Goal 1 - Progress (Week 1): Progressing toward goal PT Short Term Goal 2 (Week 1): Pt will complete transfer with LRAD and mod A consistently PT Short Term Goal 2 - Progress (Week 1): Met PT Short Term Goal 3 (Week 1): Pt will initiate gait training PT Short Term Goal 3 - Progress (Week 1): Met Week 2:  PT Short Term Goal 1 (Week 2): =LTG due to estimated LOS  Skilled Therapeutic Interventions/Progress Updates: Pt received seated in w/c, denies pain and agreeable to treatment. Transported totalA to gym via w/c for energy conservation. Gait x15'  with RW and minA for RLE progression. Two sets standing alternating toe taps to 3" step with BUE support for focus on LLE coordination, L stance control, and R weight shift to facilitate L foot clearance. Gait x15' to return to w/c; min guard overall with use of ace wrap dorsiflexion assist and improved foot clearance. Side stepping R/L at hall rail min guard/minA for LLE progression, tactile cues for glute med/max activation. Stand pivot w/c <>nustep minA. Performed BLE nustep level 3 with average 30 steps/min for LE strengthening and aerobic endurance. Returned to room totalA; remained seated in w/c at end of session, chair alarm intact and all needs in reach.      Therapy Documentation Precautions:  Precautions Precautions: Fall, Sternal Restrictions Weight Bearing Restrictions: No Other Position/Activity Restrictions: sternal precautions   See Function Navigator for Current Functional Status.  Therapy/Group: Individual Therapy  Corliss Skains 02/13/2018, 11:30 AM

## 2018-02-13 NOTE — Progress Notes (Signed)
Speech Language Pathology Daily Session Note  Patient Details  Name: Nathan [name] D Mom Jr. MRN: [id number] Date of Birth: August 13, 1945  Today's Date: 02/13/2018 SLP Individual Time: 1400-1500 SLP Individual Time Calculation (min): 60 min  Short Term Goals: Week 2: SLP Short Term Goal 1 (Week 2): Patient will complete semi-complex problem solving tasks with Min A verbal cues.  SLP Short Term Goal 2 (Week 2): Patient will demonstrate recall of daily information with Min A verbal cues for use of compensatory strategies.  SLP Short Term Goal 3 (Week 2): Patient will demonstrate selective attention in a moderately distracting enviornment for ~45 minutes with supervision verbal cues.  SLP Short Term Goal 4 (Week 2): Patient will self-monitor and correct errors during functional tasks with Min A verbal cues.   Skilled Therapeutic Interventions:Skilled ST services focused on cognitive skills. Pt was very lethargic and stated he was ready to get in bed. Pt demonstrated difficulty keeping eyes open, but agreed to participate in skilled ST services. Pt demonstrated recall of today therapeutic events without use of memory notebook, requiring min A verbal cues. SLP facilitated semi-complex problem solving and error awareness with organization task, sorting items in a closet, pt required mod A verbal cues with increase difficulty processing complex language pertaining to prepositions. Pt's performances was greatly impacted by fatigue. Pt requested to get into bed and required min A verbal cues to recall [facility]. SLP requested assistance transferring from nurse due to extreme fatigue. SLP and nurse transferred pt into bed. Pt was left in room with call bell within reach and bed alarm set. Recommend to continue skilled ST services.      Function:  Eating Eating                 Cognition Comprehension Comprehension assist level: Follows basic conversation/direction with  extra time/assistive device  Expression   Expression assist level: Expresses complex ideas: With extra time/assistive device  Social Interaction Social Interaction assist level: Interacts appropriately 75 - 89% of the time - Needs redirection for appropriate language or to initiate interaction.  Problem Solving Problem solving assist level: Solves basic problems with no assist  Memory Memory assist level: Recognizes or recalls 75 - 89% of the time/requires cueing 10 - 24% of the time    Pain Pain Assessment Pain Score: 0-No pain  Therapy/Group: Individual Therapy  Simon Llamas  [facility] 02/13/2018, 3:09 PM

## 2018-02-13 NOTE — Plan of Care (Signed)
  Problem: RH SAFETY Goal: RH STG ADHERE TO SAFETY PRECAUTIONS W/ASSISTANCE/DEVICE Description STG Adhere to Safety Precautions With min  Assistance/Device.  Outcome: Progressing  Call light within reach, proper footwear 

## 2018-02-13 NOTE — Progress Notes (Signed)
Subjective/Complaints: Patient seen sitting up in bed this morning. He states he slept well overnight. He states he feels very good this morning.  ROS  Denies CP, SOB, N/V/D  Objective: Vital Signs: Blood pressure 108/60, pulse 78, temperature 98.2 F (36.8 C), temperature source Oral, resp. rate 18, height 5' 11.5" (1.816 m), weight 87.4 kg, SpO2 100 %. No results found. Results for orders placed or performed during the hospital encounter of 02/03/18 (from the past 72 hour(s))  Glucose, capillary     Status: Abnormal   Collection Time: 02/10/18 11:54 AM  Result Value Ref Range   Glucose-Capillary 148 (H) 70 - 99 mg/dL  Glucose, capillary     Status: Abnormal   Collection Time: 02/10/18  4:41 PM  Result Value Ref Range   Glucose-Capillary 117 (H) 70 - 99 mg/dL  Glucose, capillary     Status: Abnormal   Collection Time: 02/10/18 10:23 PM  Result Value Ref Range   Glucose-Capillary 114 (H) 70 - 99 mg/dL   Comment 1 Notify RN   Protime-INR     Status: Abnormal   Collection Time: 02/11/18  5:27 AM  Result Value Ref Range   Prothrombin Time 34.6 (H) 11.4 - 15.2 seconds   INR 3.47     Comment: Performed at [facility], [place] [address], [place], [place] [address]  Glucose, capillary     Status: Abnormal   Collection Time: 02/11/18  6:37 AM  Result Value Ref Range   Glucose-Capillary 131 (H) 70 - 99 mg/dL   Comment 1 Notify RN   Glucose, capillary     Status: Abnormal   Collection Time: 02/11/18 11:30 AM  Result Value Ref Range   Glucose-Capillary 148 (H) 70 - 99 mg/dL  Glucose, capillary     Status: Abnormal   Collection Time: 02/11/18  4:35 PM  Result Value Ref Range   Glucose-Capillary 118 (H) 70 - 99 mg/dL  Glucose, capillary     Status: Abnormal   Collection Time: 02/11/18  9:03 PM  Result Value Ref Range   Glucose-Capillary 158 (H) 70 - 99 mg/dL  Protime-INR     Status: Abnormal   Collection Time: 02/12/18  5:18 AM  Result Value Ref Range   Prothrombin Time  26.7 (H) 11.4 - 15.2 seconds   INR 2.48     Comment: Performed at [facility], [place] [address] [place], [place] [address]  Basic metabolic panel     Status: Abnormal   Collection Time: 02/12/18  5:18 AM  Result Value Ref Range   Sodium 133 (L) 135 - 145 mmol/L   Potassium 4.9 3.5 - 5.1 mmol/L   Chloride 101 98 - 111 mmol/L   CO2 27 22 - 32 mmol/L   Glucose, Bld 105 (H) 70 - 99 mg/dL   BUN 13 8 - 23 mg/dL   Creatinine, Ser 1.18 0.61 - 1.24 mg/dL   Calcium 8.7 (L) 8.9 - 10.3 mg/dL   GFR calc non Af Amer >60 >60 mL/min   GFR calc Af Amer >60 >60 mL/min    Comment: (NOTE) The eGFR has been calculated using the CKD EPI equation. This calculation has not been validated in all clinical situations. eGFR's persistently <60 mL/min signify possible Chronic Kidney Disease.    Anion gap 5 5 - 15    Comment: Performed at [facility] [address] [place], [place] [address]  CBC     Status: Abnormal   Collection Time: 02/12/18  5:18 AM  Result Value Ref Range   WBC 7.3 4.0 - 10.5 K/uL   RBC 3.39 (L) 4.22 - 5.81 MIL/uL   Hemoglobin 10.1 (L) 13.0 - 17.0 g/dL   HCT 31.3 (L) 39.0 - 52.0 %   MCV 92.3 78.0 - 100.0 fL   MCH 29.8 26.0 - 34.0 pg   MCHC 32.3 30.0 - 36.0 g/dL   RDW 13.1 11.5 - 15.5 %   Platelets 318 150 - 400 K/uL    Comment: Performed at [facility] [address], [place], [place] [address]  Glucose, capillary     Status: Abnormal   Collection Time: 02/12/18  6:29 AM  Result Value Ref Range   Glucose-Capillary 102 (H) 70 - 99 mg/dL  Glucose, capillary     Status: Abnormal   Collection Time: 02/12/18 11:30 AM  Result Value Ref Range   Glucose-Capillary 138 (H) 70 - 99 mg/dL  Glucose, capillary     Status: Abnormal   Collection Time: 02/12/18  4:40 PM  Result Value Ref Range   Glucose-Capillary 128 (H) 70 - 99 mg/dL  Glucose, capillary     Status: Abnormal   Collection Time: 02/12/18  8:42 PM  Result Value Ref Range   Glucose-Capillary 113 (H) 70 -  99 mg/dL  Glucose, capillary     Status: Abnormal   Collection Time: 02/13/18  6:26 AM  Result Value Ref Range   Glucose-Capillary 110 (H) 70 - 99 mg/dL  Protime-INR     Status: Abnormal   Collection Time: 02/13/18  7:08 AM  Result Value Ref Range   Prothrombin Time 24.3 (H) 11.4 - 15.2 seconds   INR 2.21     Comment: Performed at [facility], [place] [address] [place], [place] [address]     Constitutional: No distress . Vital signs reviewed. HENT: Normocephalic.  Atraumatic. Eyes: EOMI. No discharge. Cardiovascular: RRR. No JVD. Respiratory: CTA Bilaterally. Normal effort. GI: BS +. Non-distended. Musc: No edema or tenderness in extremities. Neuro: Alert and Oriented Motor: right upper extremity: 5/5 proximal distal Right lower cervical and 4+/5 proximal distal Left upper extremity: 4+5/5 proximal distal Left lower extremity: 4/5 proximal distal Skin:   Intact. Warm and dry.  Assessment/Plan: 1. Functional deficits secondary to RIght MCA infarct  which require 3+ hours per day of interdisciplinary therapy in a comprehensive inpatient rehab setting. Physiatrist is providing close team supervision and 24 hour management of active medical problems listed below. Physiatrist and rehab team continue to assess barriers to discharge/monitor patient progress toward functional and medical goals. FIM: Function - Bathing Position: Shower Body parts bathed by patient: Right arm, Left arm, Chest, Abdomen, Right upper leg, Left upper leg, Right lower leg, Left lower leg, Front perineal area Body parts bathed by helper: Buttocks, Back Bathing not applicable: Front perineal area, Buttocks, Right upper leg, Left upper leg, Right lower leg, Left lower leg(Declined LB bathing) Assist Level: Touching or steadying assistance(Pt > 75%)  Function- Upper Body Dressing/Undressing What is the patient wearing?: Pull over shirt/dress Pull over shirt/dress - Perfomed by patient: Thread/unthread  right sleeve, Thread/unthread left sleeve, Pull shirt over trunk, Put head through opening Pull over shirt/dress - Perfomed by helper: Put head through opening, Pull shirt over trunk Assist Level: Supervision or verbal cues Function - Lower Body Dressing/Undressing What is the patient wearing?: Pants, Shoes Position: Wheelchair/chair at sink Pants- Performed by patient: Thread/unthread right pants leg, Thread/unthread left pants leg, Pull pants up/down Pants- Performed by  helper: Thread/unthread right pants leg, Thread/unthread left pants leg, Pull pants up/down Non-skid slipper socks- Performed by patient: Don/doff right sock, Don/doff left sock Non-skid slipper socks- Performed by helper: Don/doff right sock, Don/doff left sock Shoes - Performed by patient: Don/doff right shoe, Don/doff left shoe, Fasten right, Fasten left Shoes - Performed by helper: Fasten right, Fasten left TED Hose - Performed by helper: Don/doff right TED hose, Don/doff left TED hose Assist for footwear: Supervision/touching assist Assist for lower body dressing: Touching or steadying assistance (Pt > 75%)  Function - Toileting Toileting activity did not occur: No continent bowel/bladder event Toileting steps completed by helper: Performs perineal hygiene, Adjust clothing after toileting Toileting Assistive Devices: Other (comment)(foley cath) Assist level: Touching or steadying assistance (Pt.75%)  Function - Toilet Transfers Toilet transfer activity did not occur: Refused Toilet transfer assistive device: Bedside commode, Walker Assist level to toilet: Moderate assist (Pt 50 - 74%/lift or lower) Assist level from toilet: Moderate assist (Pt 50 - 74%/lift or lower) Assist level to bedside commode (at bedside): Touching or steadying assistance (Pt > 75%) Assist level from bedside commode (at bedside): Touching or steadying assistance (Pt > 75%)  Function - Chair/bed transfer Chair/bed transfer method:  Ambulatory Chair/bed transfer assist level: Touching or steadying assistance (Pt > 75%) Chair/bed transfer assistive device: Walker Chair/bed transfer details: Verbal cues for sequencing, Verbal cues for technique, Verbal cues for precautions/safety, Manual facilitation for weight shifting, Manual facilitation for placement  Function - Locomotion: Wheelchair Will patient use wheelchair at discharge?: No Function - Locomotion: Ambulation Ambulation activity did not occur: Safety/medical concerns Assistive device: Walker-rolling Max distance: 20 Assist level: Touching or steadying assistance (Pt > 75%) Assist level: Touching or steadying assistance (Pt > 75%)  Function - Comprehension Comprehension: Auditory Comprehension assist level: Follows basic conversation/direction with extra time/assistive device  Function - Expression Expression: Verbal Expression assist level: Expresses complex ideas: With extra time/assistive device  Function - Social Interaction Social Interaction assist level: Interacts appropriately 75 - 89% of the time - Needs redirection for appropriate language or to initiate interaction.  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 DVT Prophylaxis/Anticoagulation: Pharmaceutical:Coumadin per pharm protocol   INR therapeutic on  8/19 3. Pain Management:prn tylenol. Oxycodone or tramadol for more severe pain 4. Mood:LCSW to follow for evaluation and support. 5. Neuropsych: This patientis ?fullycapable of making decisions on hisown behalf. 6. Skin/Wound Care:Added protein supplements between meals 7. Fluids/Electrolytes/Nutrition:Strict I/O. Intake poor due to recent teeth extraction.  8. CAD s/p CABG with MVR: Monitor for symptoms with activity. On coumadin and Lipitor--coreg added  9.PAF: Cont warfarin, coreg  10. R-MCA stroke with extension: Left sided weakness resolving. On coumadin.  11. Chronic systolic CHF: Encourage IS. Monitor weights daily with strict I/O. Cont meds Filed Weights   02/11/18 0416 02/12/18 0451 02/13/18 0432  Weight: 86.5 kg 87 kg 87.4 kg  12. H/o BPH/Acute Urinary retention:   Cont foley at present 13. Constipation: Increased mirlax to bid 14. ABLA/ Continue to monitor H/H for recovery.    [place] 10.1 on 8/18  Continue to monitor 15. Hyponatremia: Continue to monitor for now.   Sodium 133 on 8/18  Continue to monitor 16. T2DM: Monitor BS ac/hs. On levemir daily with SSI for tighter BS control. CBG (last 3)  Recent Labs    02/12/18 1640 02/12/18 2042 02/13/18 0626  GLUCAP 128* 113* 110*   Controlled 8/19  A FACE TO FACE EVALUATION WAS PERFORMED Ankit Lorie Phenix 02/13/2018, 11:03 AM

## 2018-02-14 ENCOUNTER — Inpatient Hospital Stay (HOSPITAL_COMMUNITY): Payer: Self-pay | Admitting: Speech Pathology

## 2018-02-14 ENCOUNTER — Inpatient Hospital Stay (HOSPITAL_COMMUNITY): Payer: Self-pay | Admitting: Occupational Therapy

## 2018-02-14 ENCOUNTER — Inpatient Hospital Stay (HOSPITAL_COMMUNITY): Payer: Self-pay | Admitting: Physical Therapy

## 2018-02-14 DIAGNOSIS — R7309 Other abnormal glucose: Secondary | ICD-10-CM

## 2018-02-14 DIAGNOSIS — N401 Enlarged prostate with lower urinary tract symptoms: Secondary | ICD-10-CM

## 2018-02-14 DIAGNOSIS — R338 Other retention of urine: Secondary | ICD-10-CM

## 2018-02-14 LAB — PROTIME-INR
INR: 2.25
Prothrombin Time: 24.6 seconds — ABNORMAL HIGH (ref 11.4–15.2)

## 2018-02-14 LAB — GLUCOSE, CAPILLARY
GLUCOSE-CAPILLARY: 114 mg/dL — AB (ref 70–99)
GLUCOSE-CAPILLARY: 79 mg/dL (ref 70–99)
Glucose-Capillary: 236 mg/dL — ABNORMAL HIGH (ref 70–99)
Glucose-Capillary: 180 mg/dL — ABNORMAL HIGH (ref 70–99)

## 2018-02-14 MED ORDER — WARFARIN SODIUM 2.5 MG PO TABS
2.5000 mg | ORAL_TABLET | Freq: Once | ORAL | Status: AC
  Administered 2018-02-14: 2.5 mg via ORAL
  Filled 2018-02-14: qty 1

## 2018-02-14 NOTE — Progress Notes (Signed)
Physical Therapy Session Note  Patient Details  Name: Nathan [name] D Kerschner Jr. MRN: [id number] Date of Birth: 1946-01-24  Today's Date: 02/14/2018 PT Individual Time: 1000-1100 PT Individual Time Calculation (min): 60 min   Short Term Goals: Week 2:  PT Short Term Goal 1 (Week 2): =LTG due to estimated LOS  Skilled Therapeutic Interventions/Progress Updates: Pt received seated in w/c, c/o pain as below and agreeable to treatment. Transported outside totalA for energy conservation. Gait x25' RW and min/modA with LLE ace wrap dorsiflexion assist; verbal/tactile cues for attention to LLE for step length/foot clearance. Gait R HHA, +2 on L side for focus on facilitation of R weight shift to improve L foot clearance and midline orientation, reduce reliance on UEs for gait. Returned inside totalA. Gait x2 trials at 20-30' HHA as above; reduced reliance on R HHA and improved foot clearance, as well as improved awareness of LOBs, with pt initiating standing rest break to reorient midline. Stand pivot w/c <>mat table minA/min guard with cues for technique when transferring to L. Standing RUE reaching to R side to retrieve horseshoes to facilitate R weight shift for carryover into gait; min guard. Standing alternating toe taps BLE to 3" step; min/modA with LOBs to L side, improved with repetition. Stand pivot back to bed with min guard. Sit >supine minA for LE management. Remained in bed, alarm intact, all needs in reach.      Therapy Documentation Precautions:  Precautions Precautions: Fall, Sternal Restrictions Weight Bearing Restrictions: No Other Position/Activity Restrictions: sternal precautions Pain: Pain Assessment Pain Scale: 0-10 Pain Score: 5  Pain Type: Acute pain Pain Location: Back Pain Orientation: Lower Pain Descriptors / Indicators: Aching Pain Frequency: Intermittent Pain Onset: On-going Patients Stated Pain Goal: 4 Pain Intervention(s): Medication (See eMAR)   See Function  Navigator for Current Functional Status.   Therapy/Group: Individual Therapy  [name] Roselene Gray 02/14/2018, 11:24 AM

## 2018-02-14 NOTE — Progress Notes (Signed)
Recreational Therapy Session Note  Patient Details  Name: Nathan [name]. MRN: [id number] Date of Birth: [birth year] Today's Date: 02/14/2018 Time:1000-1100  Pain: intermittent back pain, frequent seated rest breaks Skilled Therapeutic Interventions/Progress Updates:   Goal:  Pt will maintain dynamic standing balance with min assist during simple leisure task.--MET  Pt taken outside Total assist w/c level for time and energy conservation.  ONce outside, pt ambulated with RW ~25' with min-mod assist and verbal cues for technique (LLE)  Transitioned to [facility] +2 ambulation with less reliance on UEs for ambulation.  Returned inside, and pt participated in reaching activity using horseshoes with min assist/verbal cues for balance.  Pt required frequent rest breaks due to back pain, urinary/catheter pain, and fatigue. Therapy/Group: Co-Treatment Elim Economou 02/14/2018, 3:34 PM

## 2018-02-14 NOTE — Progress Notes (Signed)
Speech Language Pathology Daily Session Note  Patient Details  Name: Nathan [name]. MRN: [id number] Date of Birth: [birth year]  Today's Date: 02/14/2018 SLP Individual Time: 1400-1500 SLP Individual Time Calculation (min): 60 min  Short Term Goals: Week 2: SLP Short Term Goal 1 (Week 2): Patient will complete semi-complex problem solving tasks with Min A verbal cues.  SLP Short Term Goal 2 (Week 2): Patient will demonstrate recall of daily information with Min A verbal cues for use of compensatory strategies.  SLP Short Term Goal 3 (Week 2): Patient will demonstrate selective attention in a moderately distracting enviornment for ~45 minutes with supervision verbal cues.  SLP Short Term Goal 4 (Week 2): Patient will self-monitor and correct errors during functional tasks with Min A verbal cues.   Skilled Therapeutic Interventions:  Pt was seen for skilled ST targeting cognitive goals.  Pt was in bed upon arrival, needing encouragement to get out of bed to participate in therapies due to pt's reports of having a "rough night" last night.  Pt was able to recall and utilize his sternal precautions when transferring to wheelchair with min assist verbal cues.  SLP facilitated the session with a novel card game targeting recall and attention goals.  Pt needed min cues for recall of task rules and procedures and mod cues for redirection to task in the presence of therapeutic distractions.  Pt was returned to room and transferred back to bed, left in bed with bed alarm set and call bell within reach.  Continue per current plan of care.    Function:  Eating Eating   Modified Consistency Diet: Yes Eating Assist Level: Set up assist for;More than reasonable amount of time   Eating Set Up Assist For: Opening containers       Cognition Comprehension Comprehension assist level: Follows basic conversation/direction with extra time/assistive device  Expression   Expression assist level:  Expresses complex ideas: With extra time/assistive device  Social Interaction Social Interaction assist level: Interacts appropriately 75 - 89% of the time - Needs redirection for appropriate language or to initiate interaction.  Problem Solving Problem solving assist level: Solves basic 75 - 89% of the time/requires cueing 10 - 24% of the time  Memory Memory assist level: Recognizes or recalls 75 - 89% of the time/requires cueing 10 - 24% of the time    Pain Pain Assessment Pain Scale: 0-10 Pain Score: 0-No pain  Therapy/Group: Individual Therapy  Lizbeth Feijoo, Melanee SpryNicole L 02/14/2018, 4:24 PM

## 2018-02-14 NOTE — Progress Notes (Signed)
Subjective/Complaints: Patient seen sitting up at the edge of his bed eating breakfast this morning. He states he slept fairly overnight. He feels that he is doing better.  ROS  denies CP, SOB, N/V/D  Objective: Vital Signs: Blood pressure 92/61, pulse 78, temperature 97.7 F (36.5 C), temperature source Oral, resp. rate 15, height 5' 11.5" (1.816 m), weight 85.3 kg, SpO2 97 %. No results found. Results for orders placed or performed during the hospital encounter of 02/03/18 (from the past 72 hour(s))  Glucose, capillary     Status: Abnormal   Collection Time: 02/11/18 11:30 AM  Result Value Ref Range   Glucose-Capillary 148 (H) 70 - 99 mg/dL  Glucose, capillary     Status: Abnormal   Collection Time: 02/11/18  4:35 PM  Result Value Ref Range   Glucose-Capillary 118 (H) 70 - 99 mg/dL  Glucose, capillary     Status: Abnormal   Collection Time: 02/11/18  9:03 PM  Result Value Ref Range   Glucose-Capillary 158 (H) 70 - 99 mg/dL  Protime-INR     Status: Abnormal   Collection Time: 02/12/18  5:18 AM  Result Value Ref Range   Prothrombin Time 26.7 (H) 11.4 - 15.2 seconds   INR 2.48     Comment: Performed at [facility] [address], [place], [place] [address]  Basic metabolic panel     Status: Abnormal   Collection Time: 02/12/18  5:18 AM  Result Value Ref Range   Sodium 133 (L) 135 - 145 mmol/L   Potassium 4.9 3.5 - 5.1 mmol/L   Chloride 101 98 - 111 mmol/L   CO2 27 22 - 32 mmol/L   Glucose, Bld 105 (H) 70 - 99 mg/dL   BUN 13 8 - 23 mg/dL   Creatinine, Ser 1.18 0.61 - 1.24 mg/dL   Calcium 8.7 (L) 8.9 - 10.3 mg/dL   GFR calc non Af Amer >60 >60 mL/min   GFR calc Af Amer >60 >60 mL/min    Comment: (NOTE) The eGFR has been calculated using the CKD EPI equation. This calculation has not been validated in all clinical situations. eGFR's persistently <60 mL/min signify possible Chronic Kidney Disease.    Anion gap 5 5 - 15    Comment: Performed at [facility] [address] [place], [place] [address]  CBC     Status: Abnormal   Collection Time: 02/12/18  5:18 AM  Result Value Ref Range   WBC 7.3 4.0 - 10.5 K/uL   RBC 3.39 (L) 4.22 - 5.81 MIL/uL   Hemoglobin 10.1 (L) 13.0 - 17.0 g/dL   HCT 31.3 (L) 39.0 - 52.0 %   MCV 92.3 78.0 - 100.0 fL   MCH 29.8 26.0 - 34.0 pg   MCHC 32.3 30.0 - 36.0 g/dL   RDW 13.1 11.5 - 15.5 %   Platelets 318 150 - 400 K/uL    Comment: Performed at [facility], [place] [address], [place], [place] [address]  Glucose, capillary     Status: Abnormal   Collection Time: 02/12/18  6:29 AM  Result Value Ref Range   Glucose-Capillary 102 (H) 70 - 99 mg/dL  Glucose, capillary     Status: Abnormal   Collection Time: 02/12/18 11:30 AM  Result Value Ref Range   Glucose-Capillary 138 (H) 70 - 99 mg/dL  Glucose, capillary     Status: Abnormal   Collection Time: 02/12/18  4:40 PM  Result Value Ref Range  Glucose-Capillary 128 (H) 70 - 99 mg/dL  Glucose, capillary     Status: Abnormal   Collection Time: 02/12/18  8:42 PM  Result Value Ref Range   Glucose-Capillary 113 (H) 70 - 99 mg/dL  Glucose, capillary     Status: Abnormal   Collection Time: 02/13/18  6:26 AM  Result Value Ref Range   Glucose-Capillary 110 (H) 70 - 99 mg/dL  Protime-INR     Status: Abnormal   Collection Time: 02/13/18  7:08 AM  Result Value Ref Range   Prothrombin Time 24.3 (H) 11.4 - 15.2 seconds   INR 2.21     Comment: Performed at [facility], [place] [address] [place], [place] [address]  Glucose, capillary     Status: Abnormal   Collection Time: 02/13/18 11:43 AM  Result Value Ref Range   Glucose-Capillary 218 (H) 70 - 99 mg/dL  Glucose, capillary     Status: Abnormal   Collection Time: 02/13/18  5:02 PM  Result Value Ref Range   Glucose-Capillary 109 (H) 70 - 99 mg/dL  Glucose, capillary     Status: Abnormal   Collection Time: 02/13/18 10:10 PM  Result Value Ref Range   Glucose-Capillary 124 (H) 70 - 99 mg/dL  Glucose,  capillary     Status: Abnormal   Collection Time: 02/14/18  6:45 AM  Result Value Ref Range   Glucose-Capillary 114 (H) 70 - 99 mg/dL     Constitutional: No distress . Vital signs reviewed. HENT: Normocephalic.  Atraumatic. Eyes: EOMI. No discharge. Cardiovascular: RRR. No JVD. Respiratory: CTA bilaterally. Normal effort. GI: BS +. Non-distended. Musc: No edema or tenderness in extremities. Neuro: Alert and Oriented Motor: right upper extremity: 5/5 proximal distal Right lower extremity: 4+/5 proximal distal Left upper extremity: 4+5/5 proximal distal (stable) Left lower extremity: 4/5 proximal distal (stable) Skin:   Intact. Warm and dry.  Assessment/Plan: 1. Functional deficits secondary to RIght MCA infarct  which require 3+ hours per day of interdisciplinary therapy in a comprehensive inpatient rehab setting. Physiatrist is providing close team supervision and 24 hour management of active medical problems listed below. Physiatrist and rehab team continue to assess barriers to discharge/monitor patient progress toward functional and medical goals. FIM: Function - Bathing Position: Shower Body parts bathed by patient: Right arm, Left arm, Chest, Abdomen, Right upper leg, Left upper leg, Right lower leg, Left lower leg, Front perineal area Body parts bathed by helper: Buttocks, Back Bathing not applicable: Front perineal area, Buttocks, Right upper leg, Left upper leg, Right lower leg, Left lower leg(Declined LB bathing) Assist Level: Touching or steadying assistance(Pt > 75%)  Function- Upper Body Dressing/Undressing What is the patient wearing?: Pull over shirt/dress Pull over shirt/dress - Perfomed by patient: Thread/unthread right sleeve, Thread/unthread left sleeve, Pull shirt over trunk, Put head through opening Pull over shirt/dress - Perfomed by helper: Put head through opening, Pull shirt over trunk Assist Level: Set up Set up : To obtain clothing/put away Function -  Lower Body Dressing/Undressing What is the patient wearing?: Pants, Shoes Position: Sitting EOB Pants- Performed by patient: Thread/unthread right pants leg, Thread/unthread left pants leg, Pull pants up/down Pants- Performed by helper: Thread/unthread right pants leg, Thread/unthread left pants leg, Pull pants up/down Non-skid slipper socks- Performed by patient: Don/doff right sock, Don/doff left sock Non-skid slipper socks- Performed by helper: Don/doff right sock, Don/doff left sock Shoes - Performed by patient: Don/doff right shoe, Don/doff left shoe, Fasten right, Fasten left Shoes - Performed by helper: Fasten  right, Fasten left TED Hose - Performed by helper: Don/doff right TED hose, Don/doff left TED hose Assist for footwear: Supervision/touching assist Assist for lower body dressing: Touching or steadying assistance (Pt > 75%)  Function - Toileting Toileting activity did not occur: No continent bowel/bladder event Toileting steps completed by helper: Performs perineal hygiene, Adjust clothing after toileting Toileting Assistive Devices: Other (comment)(foley cath) Assist level: Touching or steadying assistance (Pt.75%)  Function - Toilet Transfers Toilet transfer activity did not occur: Refused Toilet transfer assistive device: Bedside commode, Walker Assist level to toilet: Moderate assist (Pt 50 - 74%/lift or lower) Assist level from toilet: Moderate assist (Pt 50 - 74%/lift or lower) Assist level to bedside commode (at bedside): Touching or steadying assistance (Pt > 75%) Assist level from bedside commode (at bedside): Touching or steadying assistance (Pt > 75%)  Function - Chair/bed transfer Chair/bed transfer method: Stand pivot Chair/bed transfer assist level: Touching or steadying assistance (Pt > 75%) Chair/bed transfer assistive device: Walker Chair/bed transfer details: Verbal cues for sequencing, Verbal cues for technique, Verbal cues for precautions/safety, Manual  facilitation for weight shifting, Manual facilitation for placement  Function - Locomotion: Wheelchair Will patient use wheelchair at discharge?: No Function - Locomotion: Ambulation Ambulation activity did not occur: Safety/medical concerns Assistive device: Walker-rolling Max distance: 15 Assist level: Touching or steadying assistance (Pt > 75%) Assist level: Touching or steadying assistance (Pt > 75%)  Function - Comprehension Comprehension: Auditory Comprehension assist level: Follows basic conversation/direction with extra time/assistive device  Function - Expression Expression: Verbal Expression assist level: Expresses complex ideas: With extra time/assistive device  Function - Social Interaction Social Interaction assist level: Interacts appropriately 75 - 89% of the time - Needs redirection for appropriate language or to initiate interaction.  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 DVT Prophylaxis/Anticoagulation: Pharmaceutical:Coumadin per pharm protocol   INR therapeutic on 8/20 3. Pain Management:prn tylenol. Oxycodone or tramadol for more severe pain 4. Mood:LCSW to follow for evaluation and support. 5. Neuropsych: This patientis ?fullycapable of making decisions on hisown behalf. 6. Skin/Wound Care:Added protein supplements between meals 7. Fluids/Electrolytes/Nutrition:Strict I/O. Intake poor due to recent teeth extraction.  8. CAD s/p CABG with MVR: Monitor for symptoms with  activity. On coumadin and Lipitor--coreg added  9.PAF: Cont warfarin, coreg  10. R-MCA stroke with extension: Left sided weakness resolving. On coumadin.  11. Chronic systolic CHF: Encourage IS. Monitor weights daily with strict I/O. Cont meds Filed Weights   02/12/18 0451 02/13/18 0432 02/14/18 0517  Weight: 87 kg 87.4 kg 85.3 kg   ? Stable on 8/20 12. H/o BPH/Acute Urinary retention:   Cont foley at present per urology, awaiting further recs 13. Constipation: Increased mirlax to bid 14. ABLA/ Continue to monitor H/H for recovery.    [place] 10.1 on 8/18  Continue to monitor 15. Hyponatremia: Continue to monitor for now.   Sodium 133 on 8/18  Continue to monitor 16. T2DM: Monitor BS ac/hs. On levemir daily with SSI for tighter BS control. CBG (last 3)  Recent Labs  02/13/18 1702 02/13/18 2210 02/14/18 0645  GLUCAP 109* 124* 114*   Labile on 8/20  A FACE TO FACE EVALUATION WAS PERFORMED Nathan [name] Lorie Phenix 02/14/2018, 9:09 AM

## 2018-02-14 NOTE — Progress Notes (Signed)
Occupational Therapy Session Note  Patient Details  Name: Nathan PhilipsRandleman D Sanor Jr. MRN: [id number] Date of Birth: [birth year]  Today's Date: 02/14/2018 OT Individual Time: 4098-11910730-0845 OT Individual Time Calculation (min): 75 min    Short Term Goals: Week 2:  OT Short Term Goal 1 (Week 2): Pt will ambulate into bathroom with min A to increase functional ambulation abilities OT Short Term Goal 2 (Week 2): Pt will complete 2 grooming tasks in standing in order to increase functional activity tolerance and endurance OT Short Term Goal 3 (Week 2): Pt will complete full bathing/dressing task with min cuing for re-direction to task.  Skilled Therapeutic Interventions/Progress Updates:    Pt seen for OT session focusing on ADL re-training and attention to task. Pt sitting up in bed upon arrival eating breakfast, agreeable to tx session. He transferred to sitting EOB with VCs for sequencing. Ate remainder of breakfast seated EOB. Pt able to use B UEs to assist with set-up of breakfast items. He dressed seated EOB, increased time for problem solving clothing orientation of shirt, however, able to manage independently. He required mod A to stand from EOB to RW, able to pull pants up with steadying assist.  He ambulated short distance to sink, VCs for awareness and clearance of L LE during functional ambulation. He completed grooming tasks standing at sink, required seated rest break following each task, tolerating ~30 seconds-1 minute in standing before requiring seated rest break.  Pt then completed functional ambulation and transfers throughout room, ambulating with RW to various seating surfaces, turning and sitting. Pt requiring min-mod A depending on seated height surface for sit>stand, completing  sit> stand from recliner, standard chair without armrests, and BSC over toilet during task.[name]. Extended rest breaks required during each trial. Pt initially requiring min-mod cuing for RW management and sequencing of  turns, progressing to only steadying assist on last trail.  Pt returned to w/c at end of session, voicing increased fatigue from session though proud of what he was able to accomplish. Left seated with all needs in reach and chair belt on.   Therapy Documentation Precautions:  Precautions Precautions: Fall, Sternal Restrictions Weight Bearing Restrictions: No Other Position/Activity Restrictions: sternal precautions Pain:   No/denies pain ADL: ADL ADL Comments: Please see functional navigator  See Function Navigator for Current Functional Status.   Therapy/Group: Individual Therapy  Adanya Sosinski L 02/14/2018, 7:03 AM

## 2018-02-14 NOTE — Progress Notes (Signed)
ANTICOAGULATION CONSULT NOTE-Follow Up Note  Pharmacy Consult for warfarin Indication: atrial fibrillation  Allergies  Allergen Reactions  . Fentanyl Shortness Of Breath  . Promethazine Hcl Anaphylaxis and Other (See Comments)    Cardiac arrest  . Foeniculum Vulgare Other (See Comments)    Fennel bulbs--nausea only  . Metformin And Related Diarrhea  . Penicillins Nausea Only    Has patient had a PCN reaction causing immediate rash, facial/tongue/throat swelling, SOB or lightheadedness with hypotension: no Has patient had a PCN reaction causing severe rash involving mucus membranes or skin necrosis: no Has patient had a PCN reaction that required hospitalization: no Has patient had a PCN reaction occurring within the last 10 years: yes If all of the above answers are "NO", then may proceed with Cephalosporin use.   . Sulfa Antibiotics Other (See Comments)    G.I. Upset    Patient Measurements: Height: 5' 11.5" (181.6 cm) Weight: 188 lb (85.3 kg) IBW/kg (Calculated) : 76.45  Vital Signs: Temp: 97.7 F (36.5 C) (08/20 0517) Temp Source: Oral (08/20 0517) BP: 88/59 (08/20 0949) Pulse Rate: 75 (08/20 0949)  Labs: Recent Labs    02/12/18 0518 02/13/18 0708 02/14/18 0939  HGB 10.1*  --   --   HCT 31.3*  --   --   PLT 318  --   --   LABPROT 26.7* 24.3* 24.6*  INR 2.48 2.21 2.25  CREATININE 1.18  --   --     Estimated Creatinine Clearance: 63.9 mL/min (by C-G formula based on SCr of 1.18 mg/dL).   Medical History: Past Medical History:  Diagnosis Date  . Atrial fibrillation with RVR (HCC) 05/01/2017  . Cardiomyopathy (HCC) 05/01/2017  . CHF (congestive heart failure) (HCC)   . 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 Coronary artery disease   . DM (diabetes mellitus) type 2, uncontrolled, with ketoacidosis (HCC) 05/01/2017  . Essential hypertension 11/26/2016  . History of kidney stones   . HLD (hyperlipidemia) 05/01/2017  . IVCD  (intraventricular conduction defect) 04/24/2015  . Mitral valve regurgitation   . Mixed dyslipidemia 11/26/2016  . 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 Pharmacy consulted to manage warfarin dosing.  INR today remains therapeutic at 2.25. No bleeding noted.  Goal of Therapy:  INR 2-3 Monitor platelets by anticoagulation protocol: Yes   Plan:  Warfarin 2.5 mg x 1 dose Monitor daily PT/INR   Robyne AskewKaren Emilene Roma, RPh Clinical Pharmacist Phone #: [phone] til 4 p.m. Please check AMION for all [facility] phone numbers After 10:00 PM, call Main Pharmacy [phone]  02/14/2018   11:42 AM

## 2018-02-15 ENCOUNTER — Inpatient Hospital Stay (HOSPITAL_COMMUNITY): Payer: Self-pay | Admitting: Speech Pathology

## 2018-02-15 ENCOUNTER — Inpatient Hospital Stay (HOSPITAL_COMMUNITY): Payer: Self-pay | Admitting: Physical Therapy

## 2018-02-15 ENCOUNTER — Inpatient Hospital Stay (HOSPITAL_COMMUNITY): Payer: Self-pay | Admitting: Occupational Therapy

## 2018-02-15 LAB — GLUCOSE, CAPILLARY
GLUCOSE-CAPILLARY: 154 mg/dL — AB (ref 70–99)
Glucose-Capillary: 122 mg/dL — ABNORMAL HIGH (ref 70–99)
Glucose-Capillary: 178 mg/dL — ABNORMAL HIGH (ref 70–99)
Glucose-Capillary: 104 mg/dL — ABNORMAL HIGH (ref 70–99)

## 2018-02-15 LAB — CBC
HCT: 29.7 % — ABNORMAL LOW (ref 39.0–52.0)
Hemoglobin: 9.7 g/dL — ABNORMAL LOW (ref 13.0–17.0)
MCH: 29.7 pg (ref 26.0–34.0)
MCHC: 32.7 g/dL (ref 30.0–36.0)
MCV: 90.8 fL (ref 78.0–100.0)
Platelets: 287 10*3/uL (ref 150–400)
RBC: 3.27 MIL/uL — ABNORMAL LOW (ref 4.22–5.81)
RDW: 12.9 % (ref 11.5–15.5)
WBC: 6.2 10*3/uL (ref 4.0–10.5)

## 2018-02-15 LAB — BASIC METABOLIC PANEL
Anion gap: 8 (ref 5–15)
BUN: 14 mg/dL (ref 8–23)
CO2: 26 mmol/L (ref 22–32)
Calcium: 8.8 mg/dL — ABNORMAL LOW (ref 8.9–10.3)
Chloride: 102 mmol/L (ref 98–111)
Creatinine, Ser: 0.97 mg/dL (ref 0.61–1.24)
GFR calc Af Amer: >60 mL/min (ref >60)
GLUCOSE: 117 mg/dL — AB (ref 70–99)
Potassium: 4.6 mmol/L (ref 3.5–5.1)
Sodium: 136 mmol/L (ref 135–145)

## 2018-02-15 LAB — PROTIME-INR
INR: 2.23
Prothrombin Time: 24.5 seconds — ABNORMAL HIGH (ref 11.4–15.2)

## 2018-02-15 MED ORDER — GLIPIZIDE ER 10 MG PO TB24
10.0000 mg | ORAL_TABLET | Freq: Every day | ORAL | Status: DC
  Administered 2018-02-16 – 2018-02-20 (×3): 10 mg via ORAL
  Filled 2018-02-15 (×7): qty 1

## 2018-02-15 MED ORDER — WARFARIN SODIUM 2.5 MG PO TABS
2.5000 mg | ORAL_TABLET | Freq: Once | ORAL | Status: AC
  Administered 2018-02-15: 2.5 mg via ORAL
  Filled 2018-02-15: qty 1

## 2018-02-15 MED ORDER — REPAGLINIDE 0.5 MG PO TABS
0.5000 mg | ORAL_TABLET | Freq: Three times a day (TID) | ORAL | Status: DC
  Administered 2018-02-16 – 2018-02-17 (×5): 0.5 mg via ORAL
  Filled 2018-02-15: qty 0.5
  Filled 2018-02-15 (×6): qty 1
  Filled 2018-02-15: qty 0.5
  Filled 2018-02-15 (×6): qty 1
  Filled 2018-02-15: qty 0.5

## 2018-02-15 NOTE — Progress Notes (Addendum)
Patient's BS well controlled. Hgb A1C-5.9 on glucotrol and Prandin PTA. Will discontinue Levemir and resume prandin at home dose. Resume glucotrol at 5 mg daily and titrate upwards.

## 2018-02-15 NOTE — Progress Notes (Signed)
Called PA about medications.

## 2018-02-15 NOTE — Progress Notes (Signed)
Social Work Patient ID: Nathan [name]., male   DOB: 26-Dec-1945, 72 y.o.   MRN: [id number]  Met with pt and left message for wife to discuss team conference progress in therapies and plan. Will need family education prior to discharge home. Will await wife's return call.

## 2018-02-15 NOTE — Progress Notes (Signed)
Physical Therapy Session Note  Patient Details  Name: Nathan [name] Jr. MRN: [id number] Date of Birth: [birth year]  Today's Date: 02/15/2018 PT Individual Time: 1100-1200 PT Individual Time Calculation (min): 60 min   Short Term Goals: Week 2:  PT Short Term Goal 1 (Week 2): =LTG due to estimated LOS  Skilled Therapeutic Interventions/Progress Updates: Pt received seated in w/c, denies pain but reports "very tired; agreeable to treatment. Transported totalA to gym for energy conservation. Gait forward/backward on R hall rail x20' each with ace wrap dorsiflexion assist LLE; minA initially, reduced to min guard with repetition, repetitive cues for LLE foot clearance, symmetrical step length. Stand pivot minA to R side to mat, no AD. Standing alternating toe taps to 3" step for focus on weight shifting, LLE coordination. Gait R HHA x20-25' x2 trials with min/modA; initially pt able to demo normalized step length, reduced clearance as pt fatigues. Side stepping R/L with minA 4x10' each, cues for L foot placement/R weight shift to prevent LOB to L side. Returned to room totalA; remained seated in w/c at end of session, chair alarm intact and all needs in reach.      Therapy Documentation Precautions:  Precautions Precautions: Fall, Sternal Restrictions Weight Bearing Restrictions: No Other Position/Activity Restrictions: sternal precautions  See Function Navigator for Current Functional Status.   Therapy/Group: Individual Therapy  [name] 02/15/2018, 12:01 PM

## 2018-02-15 NOTE — Progress Notes (Signed)
Occupational Therapy Session Note  Patient Details  Name: Nathan PhilipsRandleman D Denk Jr. MRN: [id number] Date of Birth: Dec 30, 1945  Today's Date: 02/15/2018 OT Individual Time: 2956-21300745-0900 OT Individual Time Calculation (min): 75 min    Short Term Goals: Week 2:  OT Short Term Goal 1 (Week 2): Pt will ambulate into bathroom with min A to increase functional ambulation abilities OT Short Term Goal 2 (Week 2): Pt will complete 2 grooming tasks in standing in order to increase functional activity tolerance and endurance OT Short Term Goal 3 (Week 2): Pt will complete full bathing/dressing task with min cuing for re-direction to task.  Skilled Therapeutic Interventions/Progress Updates:    Pt seen for OT ADL bathing/dressing session. Pt in supine upon arrival, agreeable to tx session and denying pain. He transferred to EOB using hospital bed features with supervision. He donned shoes with increased time and supervision seated EOB. He stood from EOB with mod A to RW. He ambulated into bathroom with steadying assist overall, mod cuing for L LE awareness/ clearance and RW management in functional context. He bathed seated on BSC in shower, able to cross LEs into figure four position in order to wash LE, and [facility] to complete pericare/buttock hygiene. Required extended seated rst break prior to ambluating out of bathroom in same manner as described above. He dressed seated in w/c, required significantly increased time for problem solving clothing orientation. Pt stood from w/c with mod A for buttock dressing change to be completed and brief donned, tolerated ~45 seconds in standing before requiring seated rst break, unable to tolerate enough standing time to pull pants up.  He stood at sink to complete oral care and brush hair- seated rest break required btwn tasks. Oral care completed again from seated position as pt not thorugh with task in standing as he was rushing to finish.   Pt left seated in  w/c at end of session, all needs in reach and chair alarm on with RN present administering morning meds. Throughout session, pt provided with education regarding energy conservation, importance of participation with ADLs in order to increase functional activity tolerance and d/c planning including need for 24 hr supervision. Pt voiced understanding.    Therapy Documentation Precautions:  Precautions Precautions: Fall, Sternal Restrictions Weight Bearing Restrictions: No Other Position/Activity Restrictions: sternal precautions Pain:   No/denies pain ADL: ADL ADL Comments: Please see functional navigator  See Function Navigator for Current Functional Status.   Therapy/Group: Individual Therapy  Naleigha Raimondi L 02/15/2018, 6:55 AM

## 2018-02-15 NOTE — Progress Notes (Signed)
Subjective/Complaints: Patient seen lying in bed this AM. He states he feels he is making in progress.   ROS  Denies CP, SOB, N/V/D  Objective: Vital Signs: Blood pressure 99/65, pulse 76, temperature 98.7 F (37.1 C), temperature source Oral, resp. rate 18, height 5' 11.5" (1.816 m), weight 85.3 kg, SpO2 98 %. No results found. Results for orders placed or performed during the hospital encounter of 02/03/18 (from the past 72 hour(s))  Glucose, capillary     Status: Abnormal   Collection Time: 02/12/18 11:30 AM  Result Value Ref Range   Glucose-Capillary 138 (H) 70 - 99 mg/dL  Glucose, capillary     Status: Abnormal   Collection Time: 02/12/18  4:40 PM  Result Value Ref Range   Glucose-Capillary 128 (H) 70 - 99 mg/dL  Glucose, capillary     Status: Abnormal   Collection Time: 02/12/18  8:42 PM  Result Value Ref Range   Glucose-Capillary 113 (H) 70 - 99 mg/dL  Glucose, capillary     Status: Abnormal   Collection Time: 02/13/18  6:26 AM  Result Value Ref Range   Glucose-Capillary 110 (H) 70 - 99 mg/dL  Protime-INR     Status: Abnormal   Collection Time: 02/13/18  7:08 AM  Result Value Ref Range   Prothrombin Time 24.3 (H) 11.4 - 15.2 seconds   INR 2.21     Comment: Performed at [facility], [place] [address], [place], [place] [address]  Glucose, capillary     Status: Abnormal   Collection Time: 02/13/18 11:43 AM  Result Value Ref Range   Glucose-Capillary 218 (H) 70 - 99 mg/dL  Glucose, capillary     Status: Abnormal   Collection Time: 02/13/18  5:02 PM  Result Value Ref Range   Glucose-Capillary 109 (H) 70 - 99 mg/dL  Glucose, capillary     Status: Abnormal   Collection Time: 02/13/18 10:10 PM  Result Value Ref Range   Glucose-Capillary 124 (H) 70 - 99 mg/dL  Glucose, capillary     Status: Abnormal   Collection Time: 02/14/18  6:45 AM  Result Value Ref Range   Glucose-Capillary 114 (H) 70 - 99 mg/dL  Protime-INR     Status: Abnormal   Collection Time: 02/14/18   9:39 AM  Result Value Ref Range   Prothrombin Time 24.6 (H) 11.4 - 15.2 seconds   INR 2.25     Comment: Performed at [facility], [place] [address] [place], [place] [address]  Glucose, capillary     Status: Abnormal   Collection Time: 02/14/18 11:38 AM  Result Value Ref Range   Glucose-Capillary 236 (H) 70 - 99 mg/dL  Glucose, capillary     Status: None   Collection Time: 02/14/18  4:49 PM  Result Value Ref Range   Glucose-Capillary 79 70 - 99 mg/dL  Glucose, capillary     Status: Abnormal   Collection Time: 02/14/18  9:04 PM  Result Value Ref Range   Glucose-Capillary 180 (H) 70 - 99 mg/dL  Protime-INR     Status: Abnormal   Collection Time: 02/15/18  5:51 AM  Result Value Ref Range   Prothrombin Time 24.5 (H) 11.4 - 15.2 seconds   INR 2.23     Comment: Performed at [facility], [place] [address] [place],  [address]  Basic metabolic panel     Status: Abnormal   Collection Time: 02/15/18  5:51 AM  Result Value Ref Range   Sodium 136 135 -  145 mmol/L   Potassium 4.6 3.5 - 5.1 mmol/L   Chloride 102 98 - 111 mmol/L   CO2 26 22 - 32 mmol/L   Glucose, Bld 117 (H) 70 - 99 mg/dL   BUN 14 8 - 23 mg/dL   Creatinine, Ser 0.97 0.61 - 1.24 mg/dL   Calcium 8.8 (L) 8.9 - 10.3 mg/dL   GFR calc non Af Amer >60 >60 mL/min   GFR calc Af Amer >60 >60 mL/min    Comment: (NOTE) The eGFR has been calculated using the CKD EPI equation. This calculation has not been validated in all clinical situations. eGFR's persistently <60 mL/min signify possible Chronic Kidney Disease.    Anion gap 8 5 - 15    Comment: Performed at [facility] [address] [place] [address]  CBC     Status: Abnormal   Collection Time: 02/15/18  5:51 AM  Result Value Ref Range   WBC 6.2 4.0 - 10.5 K/uL   RBC 3.27 (L) 4.22 - 5.81 MIL/uL   Hemoglobin 9.7 (L) 13.0 - 17.0 g/dL   HCT 29.7 (L) 39.0 - 52.0 %   MCV 90.8 78.0 - 100.0 fL   MCH 29.7 26.0 - 34.0 pg   MCHC 32.7 30.0 - 36.0  g/dL   RDW 12.9 11.5 - 15.5 %   Platelets 287 150 - 400 K/uL    Comment: Performed at [facility], [place] [address], [place], [place] [address]  Glucose, capillary     Status: Abnormal   Collection Time: 02/15/18  6:46 AM  Result Value Ref Range   Glucose-Capillary 122 (H) 70 - 99 mg/dL   Comment 1 Notify RN      Constitutional: No distress . Vital signs reviewed. HENT: Normocephalic.  Atraumatic. Eyes: EOMI. No discharge. Cardiovascular: RRR. No JVD. Respiratory: CTA bilaterally. Normal effort. GI: BS +. Non-distended. Musc: No edema or tenderness in extremities. Neuro: Alert and Oriented Motor: right upper extremity: 5/5 proximal distal Right lower extremity: 4+/5 proximal distal Left upper extremity: 4+5/5 proximal distal (unchanged) Left lower extremity: 4/5 proximal distal (unchanged) Skin:   Intact. Warm and dry.  Assessment/Plan: 1. Functional deficits secondary to RIght MCA infarct  which require 3+ hours per day of interdisciplinary therapy in a comprehensive inpatient rehab setting. Physiatrist is providing close team supervision and 24 hour management of active medical problems listed below. Physiatrist and rehab team continue to assess barriers to discharge/monitor patient progress toward functional and medical goals. FIM: Function - Bathing Position: Shower Body parts bathed by patient: Right arm, Left arm, Chest, Abdomen, Right upper leg, Left upper leg, Right lower leg, Left lower leg, Front perineal area, Buttocks, Back Body parts bathed by helper: Buttocks, Back Bathing not applicable: Front perineal area, Buttocks, Right upper leg, Left upper leg, Right lower leg, Left lower leg(Declined LB bathing) Assist Level: Supervision or verbal cues  Function- Upper Body Dressing/Undressing What is the patient wearing?: Pull over shirt/dress Pull over shirt/dress - Perfomed by patient: Thread/unthread right sleeve, Thread/unthread left sleeve, Pull shirt over  trunk, Put head through opening Pull over shirt/dress - Perfomed by helper: Put head through opening, Pull shirt over trunk Assist Level: Supervision or verbal cues Set up : To obtain clothing/put away Function - Lower Body Dressing/Undressing What is the patient wearing?: Pants, Shoes Position: Wheelchair/chair at sink Pants- Performed by patient: Thread/unthread right pants leg, Thread/unthread left pants leg, Pull pants up/down Pants- Performed by helper: Thread/unthread right pants leg, Thread/unthread  left pants leg, Pull pants up/down Non-skid slipper socks- Performed by patient: Don/doff right sock, Don/doff left sock Non-skid slipper socks- Performed by helper: Don/doff right sock, Don/doff left sock Shoes - Performed by patient: Don/doff right shoe, Don/doff left shoe, Fasten right, Fasten left Shoes - Performed by helper: Fasten right, Fasten left TED Hose - Performed by helper: Don/doff right TED hose, Don/doff left TED hose Assist for footwear: Supervision/touching assist Assist for lower body dressing: Touching or steadying assistance (Pt > 75%)  Function - Toileting Toileting activity did not occur: No continent bowel/bladder event Toileting steps completed by helper: Adjust clothing prior to toileting, Performs perineal hygiene, Adjust clothing after toileting Toileting Assistive Devices: Other (comment)(foley cath) Assist level: Touching or steadying assistance (Pt.75%)  Function - Toilet Transfers Toilet transfer activity did not occur: Refused Toilet transfer assistive device: Bedside commode, Walker Assist level to toilet: Moderate assist (Pt 50 - 74%/lift or lower) Assist level from toilet: Moderate assist (Pt 50 - 74%/lift or lower) Assist level to bedside commode (at bedside): Touching or steadying assistance (Pt > 75%) Assist level from bedside commode (at bedside): Touching or steadying assistance (Pt > 75%)  Function - Chair/bed transfer Chair/bed transfer  method: Stand pivot Chair/bed transfer assist level: Touching or steadying assistance (Pt > 75%) Chair/bed transfer assistive device: Walker Chair/bed transfer details: Verbal cues for sequencing, Verbal cues for technique, Verbal cues for precautions/safety, Manual facilitation for weight shifting, Manual facilitation for placement  Function - Locomotion: Wheelchair Will patient use wheelchair at discharge?: No Function - Locomotion: Ambulation Ambulation activity did not occur: Safety/medical concerns Assistive device: Hand held assist Max distance: 35 Assist level: 2 helpers Assist level: 2 helpers  Function - Comprehension Comprehension: Auditory Comprehension assist level: Follows basic conversation/direction with extra time/assistive device  Function - Expression Expression: Verbal Expression assist level: Expresses complex ideas: With extra time/assistive device  Function - Social Interaction Social Interaction assist level: Interacts appropriately 75 - 89% of the time - Needs redirection for appropriate language or to initiate interaction.  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 DVT Prophylaxis/Anticoagulation: Pharmaceutical:Coumadin per pharm protocol   INR therapeutic on 8/21 3. Pain Management:prn tylenol. Oxycodone or tramadol for more severe pain 4. Mood:LCSW to follow for evaluation and support. 5. Neuropsych: This patientis fullycapable of  making decisions on hisown behalf. 6. Skin/Wound Care:Added protein supplements between meals 7. Fluids/Electrolytes/Nutrition:Strict I/O. Intake poor due to recent teeth extraction.  8. CAD s/p CABG with MVR: Monitor for symptoms with activity. On coumadin and Lipitor--coreg added  9.PAF: Cont warfarin, coreg  10. R-MCA stroke with extension: Left sided weakness resolving. On coumadin.  11. Chronic systolic CHF: Encourage IS. Monitor weights daily with strict I/O. Cont meds Filed Weights   02/13/18 0432 02/14/18 0517 02/15/18 0405  Weight: 87.4 kg 85.3 kg 85.3 kg   Stable on 8/21 12. H/o BPH/Acute Urinary retention:   Cont foley at present per urology until outpatient follow up 13. Constipation: Increased mirlax to  bid 14. ABLA/ Continue to monitor H/H for recovery.    Hemoglobin 9.7 on 8/21  Continue to monitor 15. Hyponatremia: Continue to monitor for now.   Sodium 136 on 8/21  Continue to monitor 16. T2DM: Monitor BS ac/hs. On levemir daily with SSI for tighter BS control. CBG (last 3)  Recent Labs    02/14/18 1649 02/14/18 2104 02/15/18 0646  GLUCAP 79 180* 122*   Labile on 8/21  A FACE TO FACE EVALUATION WAS PERFORMED Nathan [name] Lorie Phenix 02/15/2018, 9:29 AM

## 2018-02-15 NOTE — Progress Notes (Signed)
Pt is on a sleep chart. Slept for 5 hours this shift.

## 2018-02-15 NOTE — Progress Notes (Signed)
Speech Language Pathology Daily Session Note  Patient Details  Name: Nathan PhilipsRandleman D Entwistle Jr. MRN: [id number] Date of Birth: 21-Jul-1945  Today's Date: 02/15/2018 SLP Individual Time: 1030-1055 SLP Individual Time Calculation (min): 25 min  Short Term Goals: Week 2: SLP Short Term Goal 1 (Week 2): Patient will complete semi-complex problem solving tasks with Min A verbal cues.  SLP Short Term Goal 2 (Week 2): Patient will demonstrate recall of daily information with Min A verbal cues for use of compensatory strategies.  SLP Short Term Goal 3 (Week 2): Patient will demonstrate selective attention in a moderately distracting enviornment for ~45 minutes with supervision verbal cues.  SLP Short Term Goal 4 (Week 2): Patient will self-monitor and correct errors during functional tasks with Min A verbal cues.   Skilled Therapeutic Interventions: Skilled treatment session focused on cognitive goals. SLP facilitated session by providing supervision verbal cues for patient to utilize his memory notebook to recall events from previous therapy sessions. SLP also facilitated session by providing supervision-Min A verbal cues for recall of goals of skilled therapeutic intervention and anticipatory awareness in regards to d/c planning and need to stay in CIR longer to maximize independence at discharge due to limited assistance from family. Patient left upright in wheelchair with alarm on and all needs within reach. Continue with current plan of care.        Function:   Cognition Comprehension Comprehension assist level: Follows basic conversation/direction with extra time/assistive device  Expression   Expression assist level: Expresses complex ideas: With extra time/assistive device  Social Interaction Social Interaction assist level: Interacts appropriately 75 - 89% of the time - Needs redirection for appropriate language or to initiate interaction.  Problem Solving Problem solving assist level: Solves  basic 75 - 89% of the time/requires cueing 10 - 24% of the time  Memory Memory assist level: Recognizes or recalls 75 - 89% of the time/requires cueing 10 - 24% of the time    Pain No/Denies Pain   Therapy/Group: Individual Therapy  Jimmie Rueter 02/15/2018, 2:51 PM

## 2018-02-15 NOTE — Progress Notes (Signed)
Speech Language Pathology Daily Session Note  Patient Details  Name: Nathan PhilipsRandleman D Barnfield Jr. MRN: [id number] Date of Birth: [birth year]  Today's Date: 02/15/2018 SLP Individual Time: 1510-1535 SLP Individual Time Calculation (min): 25 min  Short Term Goals: Week 2: SLP Short Term Goal 1 (Week 2): Patient will complete semi-complex problem solving tasks with Min A verbal cues.  SLP Short Term Goal 2 (Week 2): Patient will demonstrate recall of daily information with Min A verbal cues for use of compensatory strategies.  SLP Short Term Goal 3 (Week 2): Patient will demonstrate selective attention in a moderately distracting enviornment for ~45 minutes with supervision verbal cues.  SLP Short Term Goal 4 (Week 2): Patient will self-monitor and correct errors during functional tasks with Min A verbal cues.   Skilled Therapeutic Interventions:  Pt was seen for skilled ST targeting cognitive goals.  Pt was asleep upon arrival but awakened easily stating, that was the best sleep I've had since I've been here.   During functional conversations with therapist pt needed min cues for redirection to topic at hand due to tangents, pt reports that he was "chatty" at baseline.   Therapist provided skilled education regarding memory compensatory strategies, given pt's ongoing reports of memory changes and difficulty recalling daily information.  Strategies were recorded into pt's memory notebook to facilitate carryover in between therapy sessions.  Pt was left in bed with bed alarm set and call bell within reach.  Continue per current plan of care.    Function:  Eating Eating                 Cognition Comprehension Comprehension assist level: Follows basic conversation/direction with no assist  Expression   Expression assist level: Expresses basic needs/ideas: With no assist  Social Interaction Social Interaction assist level: Interacts appropriately 75 - 89% of the time - Needs redirection for  appropriate language or to initiate interaction.  Problem Solving Problem solving assist level: Solves basic 75 - 89% of the time/requires cueing 10 - 24% of the time  Memory Memory assist level: Recognizes or recalls 75 - 89% of the time/requires cueing 10 - 24% of the time    Pain Pain Assessment Pain Scale: 0-10 Pain Score: 0-No pain  Therapy/Group: Individual Therapy  Skiler Olden, Melanee SpryNicole L 02/15/2018, 4:08 PM

## 2018-02-15 NOTE — Progress Notes (Signed)
ANTICOAGULATION CONSULT NOTE-Follow Up Note  Pharmacy Consult for warfarin Indication: atrial fibrillation  Allergies  Allergen Reactions  . Fentanyl Shortness Of Breath  . Promethazine Hcl Anaphylaxis and Other (See Comments)    Cardiac arrest  . Foeniculum Vulgare Other (See Comments)    Fennel bulbs--nausea only  . Metformin And Related Diarrhea  . Penicillins Nausea Only    Has patient had a PCN reaction causing immediate rash, facial/tongue/throat swelling, SOB or lightheadedness with hypotension: no Has patient had a PCN reaction causing severe rash involving mucus membranes or skin necrosis: no Has patient had a PCN reaction that required hospitalization: no Has patient had a PCN reaction occurring within the last 10 years: yes If all of the above answers are "NO", then may proceed with Cephalosporin use.   . Sulfa Antibiotics Other (See Comments)    G.I. Upset    Patient Measurements: Height: 5' 11.5" (181.6 cm) Weight: 188 lb 2.3 oz (85.3 kg) IBW/kg (Calculated) : 76.45  Vital Signs: Temp: 98.7 F (37.1 C) (08/21 0405) Temp Source: Oral (08/21 0405) BP: 99/65 (08/21 0405) Pulse Rate: 76 (08/21 0405)  Labs: Recent Labs    02/13/18 0708 02/14/18 0939 02/15/18 0551  HGB  --   --  9.7*  HCT  --   --  29.7*  PLT  --   --  287  LABPROT 24.3* 24.6* 24.5*  INR 2.21 2.25 2.23  CREATININE  --   --  0.97    Estimated Creatinine Clearance: 77.8 mL/min (by C-G formula based on SCr of 0.97 mg/dL).   Medical History: Past Medical History:  Diagnosis Date  . Atrial fibrillation with RVR (HCC) 05/01/2017  . Cardiomyopathy (HCC) 05/01/2017  . CHF (congestive heart failure) (HCC)   . 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 Coronary artery disease   . DM (diabetes mellitus) type 2, uncontrolled, with ketoacidosis (HCC) 05/01/2017  . Essential hypertension 11/26/2016  . History of kidney stones   . HLD (hyperlipidemia) 05/01/2017  .  IVCD (intraventricular conduction defect) 04/24/2015  . Mitral valve regurgitation   . Mixed dyslipidemia 11/26/2016  . 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 Pharmacy consulted to manage warfarin dosing.  INR today remains therapeutic at 2.23. No bleeding noted.  Goal of Therapy:  INR 2-3 Monitor platelets by anticoagulation protocol: Yes   Plan:  Warfarin 2.5 mg x 1 dose Monitor daily PT/INR  Keithen Capo A. Jeanella CrazePierce, PharmD, BCPS Clinical Pharmacist  Pager: [phone] Please utilize Amion for appropriate phone number to reach the unit pharmacist [facility])     02/15/2018   7:32 AM

## 2018-02-15 NOTE — Patient Care Conference (Signed)
Inpatient RehabilitationTeam Conference and Plan of Care Update Date: 02/15/2018   Time: 2:50 PM    Patient Name: Nathan PhilipsRandleman D Bame Jr.      Medical Record Number: [id number]  Date of Birth: 1946-02-04 Sex: Male         Room/Bed: 4M10C/4M10C-01 Payor Info: Payor: [employer] MEDICARE / Plan: UHC MEDICARE / Product Type: *No Product type* /    Admitting Diagnosis: Debility  Admit Date/Time:  02/03/2018  6:17 PM Admission Comments: No comment available   Primary Diagnosis:  <principal problem not specified> Principal Problem: <principal problem not specified>  Patient Active Problem List   Diagnosis Date Noted  . Labile blood glucose   . Benign prostatic hyperplasia with urinary retention   . History of CVA (cerebrovascular accident) without residual deficits   . PAF (paroxysmal atrial fibrillation) (HCC)   . Anemia of chronic disease   . Debility 02/03/2018  . 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 Pressure injury of skin 02/02/2018  . Malnutrition of moderate degree 02/01/2018  . Coronary artery disease involving native coronary artery of native heart with angina pectoris (HCC)   . S/P CABG (coronary artery bypass graft)   . Atrial fibrillation (HCC)   . Chronic combined systolic and diastolic CHF (congestive heart failure) (HCC)   . Diabetes mellitus type 2 in nonobese (HCC)   . Essential hypertension   . History of CVA (cerebrovascular accident)   . Acute blood loss anemia   . Hyponatremia   . S/P left atrial appendage ligation 01/26/2018  . S/P CABG x 4 01/23/2018  . S/P MVR (mitral valve replacement) 01/23/2018  . Mitral valve insufficiency   . On amiodarone therapy 07/28/2017  . CAD in native artery 07/28/2017  . Chronic anticoagulation 05/18/2017  . LBBB (left bundle branch block) 05/18/2017  . Hospital discharge follow-up 05/09/2017  . Paroxysmal atrial fibrillation (HCC) 05/01/2017  . Dilated cardiomyopathy (HCC)  05/01/2017  . DM (diabetes mellitus) type 2, uncontrolled, with ketoacidosis (HCC) 05/01/2017  . Hypotension 05/01/2017  . HLD (hyperlipidemia) 05/01/2017  . Cerebrovascular accident (CVA) due to embolism of right middle cerebral artery (HCC) 04/29/2017  . Hypertensive heart disease with heart failure (HCC) 11/26/2016  . Mixed dyslipidemia 11/26/2016  . Chronic systolic congestive heart failure (HCC) 04/24/2015  . IVCD (intraventricular conduction defect) 04/24/2015    Expected Discharge Date: Expected Discharge Date: 02/23/18  Team Members Present: Physician leading conference: Dr. Maryla MorrowAnkit Patel Social Worker Present: Dossie DerBecky Tahnee Cifuentes, LCSW Nurse Present: Other (comment)(Christina Tomlinson-RN) PT Present: Alyson ReedyElizabeth Tygielski, PT OT Present: Johnsie CancelAmy Lewis, OT SLP Present: Jackalyn LombardNicole Page, SLP PPS Coordinator present : Tora DuckMarie Noel, RN, CRRN     Current Status/Progress Goal Weekly Team Focus  Medical   Debility and LEFT  hemiparesis secondary to Right MCA (chronic) and deconditioning after CABG/MVR  Improve mobility, CBGs  See above   Bowel/Bladder   Foley catheter in place for urinary retention, hx of hematuria, incontinent of bowel, LBM-02/14/18-reports multiple bowel movements earlier in day, scheduled miralx held per pt request  Regain contienence of bladder and bowel, with min assist  Assess and address bowel and bladder needs q shift and PRN   Swallow/Nutrition/ Hydration             ADL's   min-mod A functional transfers pending fatigue; supervision UB bathing/dressing; steadying assist LB dressings; Requeires rest breaks throughout seated ADL tasks  Supervision overall  ADL re-training, functional transfers, functional activity tolerance and balance, initiate  family training   Mobility   minA bed mobility, transfers and gait with RW  minA overall including gait x50'  dynamic standing balance, LLE NMR, activity tolerance   Communication             Safety/Cognition/ Behavioral  Observations  Min assist   supervision   continue to address semi-complex recall, error awareness, and selective attention   Pain   Reports pain to lower back, rates 9/10, given prn oxycodone 5mg  po at 2118, pain rating decreased to 5/10.   Pain level will remain less than 3/10.   Assess and address pain q shift and prn. Notify MD for pain without relief.    Skin   Ecchymosis to right inner thigh/groin area, sternal incision and sutured areas x4 to upper abdomen healing well-no drainage noted, MASD to inner buttocks, allevyns intact, no s/sx of infection  Remain   Assess skin q shift and prn      *See Care Plan and progress notes for long and short-term goals.     Barriers to Discharge  Current Status/Progress Possible Resolutions Date Resolved   Physician    Medical stability;Other (comments)  Urinary retention  See above  Therapies, follow weights, transition to oral DM meds, Indwelling foley per Urology outpt follow up      Nursing                  PT                    OT                  SLP                SW                Discharge Planning/Teaching Needs:  HOme with wife and son made aware once agian he will require 24 hr care and son to be the one to be there when wife is working. Schedule family education next week      Team Discussion:  Making progress in therapies, currently min-mod assist level. Goals supervision-min assist. Attention better and changed to oral DM meds today per MD. CHF stable. Needs lots of rest breaks in between therapies. Pain in back and doesn't always follow sternal precautions. Will need 24 hr care at DC-will need family education next week  Revisions to Treatment Plan:  DC 8/29    Continued Need for Acute Rehabilitation Level of Care: The patient requires daily medical management by a physician with specialized training in physical medicine and rehabilitation for the following conditions: Daily direction of a multidisciplinary physical  rehabilitation program to ensure safe treatment while eliciting the highest outcome that is of practical value to the patient.: Yes Daily medical management of patient stability for increased activity during participation in an intensive rehabilitation regime.: Yes Daily analysis of laboratory values and/or radiology reports with any subsequent need for medication adjustment of medical intervention for : Urological problems;Cardiac problems  Lucy ChrisDupree, Tziporah Knoke G 02/15/2018, 4:04 PM

## 2018-02-16 ENCOUNTER — Inpatient Hospital Stay (HOSPITAL_COMMUNITY): Payer: Self-pay | Admitting: Physical Therapy

## 2018-02-16 ENCOUNTER — Inpatient Hospital Stay (HOSPITAL_COMMUNITY): Payer: Self-pay

## 2018-02-16 ENCOUNTER — Inpatient Hospital Stay (HOSPITAL_COMMUNITY): Payer: Self-pay | Admitting: Occupational Therapy

## 2018-02-16 ENCOUNTER — Inpatient Hospital Stay (HOSPITAL_COMMUNITY): Payer: Self-pay | Admitting: Speech Pathology

## 2018-02-16 DIAGNOSIS — I6930 Unspecified sequelae of cerebral infarction: Secondary | ICD-10-CM

## 2018-02-16 DIAGNOSIS — Z7901 Long term (current) use of anticoagulants: Secondary | ICD-10-CM

## 2018-02-16 LAB — PROTIME-INR
INR: 2.11
PROTHROMBIN TIME: 23.5 s — AB (ref 11.4–15.2)

## 2018-02-16 LAB — GLUCOSE, CAPILLARY
GLUCOSE-CAPILLARY: 110 mg/dL — AB (ref 70–99)
GLUCOSE-CAPILLARY: 172 mg/dL — AB (ref 70–99)
Glucose-Capillary: 54 mg/dL — ABNORMAL LOW (ref 70–99)
Glucose-Capillary: 70 mg/dL (ref 70–99)
Glucose-Capillary: 82 mg/dL (ref 70–99)

## 2018-02-16 MED ORDER — WARFARIN SODIUM 2.5 MG PO TABS
2.5000 mg | ORAL_TABLET | Freq: Once | ORAL | Status: AC
  Administered 2018-02-16: 2.5 mg via ORAL
  Filled 2018-02-16: qty 1

## 2018-02-16 NOTE — Progress Notes (Signed)
Speech Language Pathology Daily Session Note  Patient Details  Name: Nathan PhilipsRandleman D Roszak Jr. MRN: [id number] Date of Birth: 01/10/1946  Today's Date: 02/16/2018 SLP Individual Time: 1007-1100 SLP Individual Time Calculation (min): 53 min  Short Term Goals: Week 2: SLP Short Term Goal 1 (Week 2): Patient will complete semi-complex problem solving tasks with Min A verbal cues.  SLP Short Term Goal 2 (Week 2): Patient will demonstrate recall of daily information with Min A verbal cues for use of compensatory strategies.  SLP Short Term Goal 3 (Week 2): Patient will demonstrate selective attention in a moderately distracting enviornment for ~45 minutes with supervision verbal cues.  SLP Short Term Goal 4 (Week 2): Patient will self-monitor and correct errors during functional tasks with Min A verbal cues.   Skilled Therapeutic Interventions:  Pt was seen for skilled ST targeting cognitive goals.  Upon arrival, pt complained of "not feeling good" which he attributed to low blood sugar.  SLP offered pt cranberry juice which he drank and then reported feeling better.  SLP facilitated the session with a novel card game targeting use of memory compensatory strategies; specifically associations.  Pt was able to generate and recall word-picture associations for 75% accuracy independently which improved to 100% accuracy with min question cues.  Pt could then generate and recall specific category members during categorical naming portion of game for 100% accuracy with min question cues.  Pt was able to freely recall memory strategies discussed during yesterday's therapy session.  Pt was returned to room and left in wheelchair with chair alarm set and call bell within reach.  Continue per current plan of care.    Function:  Eating Eating                 Cognition Comprehension Comprehension assist level: Follows basic conversation/direction with extra time/assistive device  Expression   Expression  assist level: Expresses complex ideas: With extra time/assistive device  Social Interaction Social Interaction assist level: Interacts appropriately 75 - 89% of the time - Needs redirection for appropriate language or to initiate interaction.  Problem Solving Problem solving assist level: Solves basic 75 - 89% of the time/requires cueing 10 - 24% of the time  Memory Memory assist level: Recognizes or recalls 75 - 89% of the time/requires cueing 10 - 24% of the time    Pain Pain Assessment Pain Scale: 0-10 Pain Score: 0-No pain  Therapy/Group: Individual Therapy  Jaylan Duggar, Melanee SpryNicole L 02/16/2018, 10:52 AM

## 2018-02-16 NOTE — Progress Notes (Signed)
Occupational Therapy Session Note  Patient Details  Name: Nathan PhilipsRandleman D Serviss Jr. MRN: [id number] Date of Birth: October 07, 1945  Today's Date: 02/16/2018 OT Individual Time: 1400-1430 OT Individual Time Calculation (min): 30 min    Short Term Goals: Week 3:  OT Short Term Goal 1 (Week 3): STG=LTG due to LOS  Skilled Therapeutic Interventions/Progress Updates:    Pt resting in bed upon arrival and requesting to use toilet for bowel movement.  Pt sat EOB with supervision but required mod A for sit<>stand from EOB for stand pivot transfer (steady A) with RW to w/c.  Pt transferred to toilet with stand pivot transfer with RW, again required mod A for sit<>stand from w/c.  Pt stood X 1 from toilet but c/o dizziness.  Pt continued to have bowel movement and RN notified to stay with pt in bathroom.   Therapy Documentation Precautions:  Precautions Precautions: Fall, Sternal Restrictions Weight Bearing Restrictions: No Other Position/Activity Restrictions: sternal precautions   Pain: Pain Assessment Pain Scale: 0-10 Pain Score: 0-No pain  See Function Navigator for Current Functional Status.   Therapy/Group: Individual Therapy  Rich BraveLanier, Penn Grissett Chappell 02/16/2018, 2:36 PM

## 2018-02-16 NOTE — Progress Notes (Signed)
Subjective/Complaints: Patient seen sitting up at the edge of the bed this morning, eating breakfast. He states he slept better overnight. He has questions about his progress and about the discussion in team, yesterday.  ROS  Denies CP, SOB, N/V/D  Objective: Vital Signs: Blood pressure 98/61, pulse 76, temperature 98 F (36.7 C), temperature source Oral, resp. rate 18, height 5' 11.5" (1.816 m), weight 84.9 kg, SpO2 98 %. No results found. Results for orders placed or performed during the hospital encounter of 02/03/18 (from the past 72 hour(s))  Glucose, capillary     Status: Abnormal   Collection Time: 02/13/18 11:43 AM  Result Value Ref Range   Glucose-Capillary 218 (H) 70 - 99 mg/dL  Glucose, capillary     Status: Abnormal   Collection Time: 02/13/18  5:02 PM  Result Value Ref Range   Glucose-Capillary 109 (H) 70 - 99 mg/dL  Glucose, capillary     Status: Abnormal   Collection Time: 02/13/18 10:10 PM  Result Value Ref Range   Glucose-Capillary 124 (H) 70 - 99 mg/dL  Glucose, capillary     Status: Abnormal   Collection Time: 02/14/18  6:45 AM  Result Value Ref Range   Glucose-Capillary 114 (H) 70 - 99 mg/dL  Protime-INR     Status: Abnormal   Collection Time: 02/14/18  9:39 AM  Result Value Ref Range   Prothrombin Time 24.6 (H) 11.4 - 15.2 seconds   INR 2.25     Comment: Performed at [facility], [address], [place], [place] [address]  Glucose, capillary     Status: Abnormal   Collection Time: 02/14/18 11:38 AM  Result Value Ref Range   Glucose-Capillary 236 (H) 70 - 99 mg/dL  Glucose, capillary     Status: None   Collection Time: 02/14/18  4:49 PM  Result Value Ref Range   Glucose-Capillary 79 70 - 99 mg/dL  Glucose, capillary     Status: Abnormal   Collection Time: 02/14/18  9:04 PM  Result Value Ref Range   Glucose-Capillary 180 (H) 70 - 99 mg/dL  Protime-INR     Status: Abnormal   Collection Time: 02/15/18  5:51 AM  Result Value Ref Range   Prothrombin Time 24.5 (H) 11.4 - 15.2 seconds   INR 2.23     Comment: Performed at [facility], [place] [address] [place], [place] [address]  Basic metabolic panel     Status: Abnormal   Collection Time: 02/15/18  5:51 AM  Result Value Ref Range   Sodium 136 135 - 145 mmol/L   Potassium 4.6 3.5 - 5.1 mmol/L   Chloride 102 98 - 111 mmol/L   CO2 26 22 - 32 mmol/L   Glucose, Bld 117 (H) 70 - 99 mg/dL   BUN 14 8 - 23 mg/dL   Creatinine, Ser 0.97 0.61 - 1.24 mg/dL   Calcium 8.8 (L) 8.9 - 10.3 mg/dL   GFR calc non Af Amer >60 >60 mL/min   GFR calc Af Amer >60 >60 mL/min    Comment: (NOTE) The eGFR has been calculated using the CKD EPI equation. This calculation has not been validated in all clinical situations. eGFR's persistently <60 mL/min signify possible Chronic Kidney Disease.    Anion gap 8 5 - 15    Comment: Performed at [facility] [address], [place], [place] [address]  CBC     Status: Abnormal   Collection Time: 02/15/18  5:51 AM  Result Value  Ref Range   WBC 6.2 4.0 - 10.5 K/uL   RBC 3.27 (L) 4.22 - 5.81 MIL/uL   Hemoglobin 9.7 (L) 13.0 - 17.0 g/dL   HCT 29.7 (L) 39.0 - 52.0 %   MCV 90.8 78.0 - 100.0 fL   MCH 29.7 26.0 - 34.0 pg   MCHC 32.7 30.0 - 36.0 g/dL   RDW 12.9 11.5 - 15.5 %   Platelets 287 150 - 400 K/uL    Comment: Performed at [facility] [address] [place], [place] [address]  Glucose, capillary     Status: Abnormal   Collection Time: 02/15/18  6:46 AM  Result Value Ref Range   Glucose-Capillary 122 (H) 70 - 99 mg/dL   Comment 1 Notify RN   Glucose, capillary     Status: Abnormal   Collection Time: 02/15/18 12:00 PM  Result Value Ref Range   Glucose-Capillary 178 (H) 70 - 99 mg/dL   Comment 1 Notify RN   Glucose, capillary     Status: Abnormal   Collection Time: 02/15/18  4:39 PM  Result Value Ref Range   Glucose-Capillary 104 (H) 70 - 99 mg/dL   Comment 1 Notify RN   Glucose, capillary     Status: Abnormal   Collection  Time: 02/15/18  9:19 PM  Result Value Ref Range   Glucose-Capillary 154 (H) 70 - 99 mg/dL  Protime-INR     Status: Abnormal   Collection Time: 02/16/18  4:52 AM  Result Value Ref Range   Prothrombin Time 23.5 (H) 11.4 - 15.2 seconds   INR 2.11     Comment: Performed at [facility], [place] [address] [place], [place] [address]  Glucose, capillary     Status: Abnormal   Collection Time: 02/16/18  6:50 AM  Result Value Ref Range   Glucose-Capillary 110 (H) 70 - 99 mg/dL     Constitutional: No distress . Vital signs reviewed. HENT: Normocephalic.  Atraumatic. Eyes: EOMI. No discharge. Cardiovascular: RRR. No JVD. Respiratory: CTA bilaterally. Normal effort. GI: BS +. Non-distended. Musc: No edema or tenderness in extremities. Neuro: Alert and Oriented Motor: right upper extremity: 5/5 proximal distal Right lower extremity: 4+/5 proximal distal Left upper extremity: 4+5/5 proximal distal (stable) Left lower extremity: 4/5 proximal distal (stable) Skin:   Intact. Warm and dry.  Assessment/Plan: 1. Functional deficits secondary to RIght MCA infarct  which require 3+ hours per day of interdisciplinary therapy in a comprehensive inpatient rehab setting. Physiatrist is providing close team supervision and 24 hour management of active medical problems listed below. Physiatrist and rehab team continue to assess barriers to discharge/monitor patient progress toward functional and medical goals. FIM: Function - Bathing Position: Shower Body parts bathed by patient: Right arm, Left arm, Chest, Abdomen, Right upper leg, Left upper leg, Right lower leg, Left lower leg, Front perineal area, Buttocks, Back Body parts bathed by helper: Buttocks, Back Bathing not applicable: Front perineal area, Buttocks, Right upper leg, Left upper leg, Right lower leg, Left lower leg(Declined LB bathing) Assist Level: Supervision or verbal cues  Function- Upper Body Dressing/Undressing What is the patient  wearing?: Pull over shirt/dress Pull over shirt/dress - Perfomed by patient: Thread/unthread right sleeve, Thread/unthread left sleeve, Pull shirt over trunk, Put head through opening Pull over shirt/dress - Perfomed by helper: Put head through opening, Pull shirt over trunk Assist Level: Supervision or verbal cues Set up : To obtain clothing/put away Function - Lower Body Dressing/Undressing What is the patient wearing?:  Pants, Shoes Position: Wheelchair/chair at sink Pants- Performed by patient: Thread/unthread right pants leg, Thread/unthread left pants leg, Pull pants up/down Pants- Performed by helper: Thread/unthread right pants leg, Thread/unthread left pants leg, Pull pants up/down Non-skid slipper socks- Performed by patient: Don/doff right sock, Don/doff left sock Non-skid slipper socks- Performed by helper: Don/doff right sock, Don/doff left sock Shoes - Performed by patient: Don/doff right shoe, Don/doff left shoe, Fasten right, Fasten left Shoes - Performed by helper: Fasten right, Fasten left TED Hose - Performed by helper: Don/doff right TED hose, Don/doff left TED hose Assist for footwear: Supervision/touching assist Assist for lower body dressing: Touching or steadying assistance (Pt > 75%)  Function - Toileting Toileting activity did not occur: No continent bowel/bladder event Toileting steps completed by helper: Adjust clothing prior to toileting, Performs perineal hygiene, Adjust clothing after toileting Toileting Assistive Devices: Other (comment)(foley cath) Assist level: Touching or steadying assistance (Pt.75%)  Function - Toilet Transfers Toilet transfer activity did not occur: Refused Toilet transfer assistive device: Bedside commode, Walker Assist level to toilet: Moderate assist (Pt 50 - 74%/lift or lower) Assist level from toilet: Moderate assist (Pt 50 - 74%/lift or lower) Assist level to bedside commode (at bedside): Touching or steadying assistance (Pt >  75%) Assist level from bedside commode (at bedside): Touching or steadying assistance (Pt > 75%)  Function - Chair/bed transfer Chair/bed transfer method: Stand pivot Chair/bed transfer assist level: Touching or steadying assistance (Pt > 75%) Chair/bed transfer assistive device: Walker Chair/bed transfer details: Verbal cues for sequencing, Verbal cues for technique, Verbal cues for precautions/safety, Manual facilitation for weight shifting, Manual facilitation for placement  Function - Locomotion: Wheelchair Will patient use wheelchair at discharge?: No Function - Locomotion: Ambulation Ambulation activity did not occur: Safety/medical concerns Assistive device: Hand held assist, Rail in hallway Max distance: 25 Assist level: Touching or steadying assistance (Pt > 75%) Assist level: Touching or steadying assistance (Pt > 75%)  Function - Comprehension Comprehension: Auditory Comprehension assist level: Follows basic conversation/direction with extra time/assistive device  Function - Expression Expression: Verbal Expression assist level: Expresses complex ideas: With extra time/assistive device  Function - Social Interaction Social Interaction assist level: Interacts appropriately 75 - 89% of the time - Needs redirection for appropriate language or to initiate interaction.  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 DVT Prophylaxis/Anticoagulation: Pharmaceutical:Coumadin per pharm protocol   INR  therapeutic on 8/22 3. Pain Management:prn tylenol. Oxycodone or tramadol for more severe pain 4. Mood:LCSW to follow for evaluation and support. 5. Neuropsych: This patientis fullycapable of making decisions on hisown behalf. 6. Skin/Wound Care:Added protein supplements between meals 7. Fluids/Electrolytes/Nutrition:Strict I/O. Intake poor due to recent teeth extraction.  8. CAD s/p CABG with MVR: Monitor for symptoms with activity. On coumadin and Lipitor--coreg added  9.PAF: Cont warfarin, coreg  10. R-MCA stroke with extension: Left sided weakness resolving. On coumadin.  11. Chronic systolic CHF: Encourage IS. Monitor weights daily with strict I/O. Cont meds Filed Weights   02/14/18 4536 02/15/18 0405 02/16/18 4680  Weight: 85.3 kg 85.3 kg 84.9 kg   Stable on 8/22 12. H/o BPH/Acute Urinary retention:   Cont foley at present per urology until outpatient follow up 13. Constipation: Increased mirlax to bid 14. ABLA/ Continue to monitor H/H for recovery.    Hemoglobin 9.7 on 8/21  Continue to monitor 15. Hyponatremia: Continue to monitor for now.   Sodium 136 on 8/21  Continue to monitor 16. T2DM: Monitor BS ac/hs. On levemir daily with SSI for tighter BS control. CBG (last 3)  Recent Labs    02/15/18 1639 02/15/18 2119 02/16/18 0650  GLUCAP 104* 154* 110*   Stabilizing on 8/22  A FACE TO FACE EVALUATION WAS PERFORMED Ankit Lorie Phenix 02/16/2018, 8:57 AM

## 2018-02-16 NOTE — Progress Notes (Signed)
Occupational Therapy Weekly Progress Note  Patient Details  Name: Nathan [name]. MRN: [id number] Date of Birth: 09-03-1945  Beginning of progress report period: February 10, 2018 End of progress report period: February 16, 2018  Today's Date: 02/16/2018 OT Individual Time: 6967-8938 OT Individual Time Calculation (min): 60 min    Patient has met 2 of 3 short term goals. Pt cont to make slow but steady progress towards OT goals. He is becoming more consistent with functional transfers- min A overall with occasional mod A when fatigued or from low surface. HE requires steadying assist overall for ADLs, though requires significantly increased time and rest breaks throughout as well as VCs for redirection to task due to perseverative tendencies and easily distracted by internal and external factors. Pt on target to meet supervision level goals, will require close supervision and 24 hr assist at d/c. Will need hands on family education prior to d/c.   Patient continues to demonstrate the following deficits: ataxia, cognitive deficits, hemiplegia affecting dominant side and muscle weakness (generalized) and therefore will continue to benefit from skilled OT intervention to enhance overall performance with BADL and Reduce care partner burden.  Patient progressing toward long term goals..  Continue plan of care.  OT Short Term Goals Week 2:  OT Short Term Goal 1 (Week 2): Pt will ambulate into bathroom with min A to increase functional ambulation abilities OT Short Term Goal 1 - Progress (Week 2): Met OT Short Term Goal 2 (Week 2): Pt will complete 2 grooming tasks in standing in order to increase functional activity tolerance and endurance OT Short Term Goal 2 - Progress (Week 2): Met OT Short Term Goal 3 (Week 2): Pt will complete full bathing/dressing task with min cuing for re-direction to task. OT Short Term Goal 3 - Progress (Week 2): Not met Week 3:  OT Short Term Goal 1 (Week 3): STG=LTG  due to LOS  Skilled Therapeutic Interventions/Progress Updates:    Pt seen for OT session focusing on ADl re-training, functional transfers, and ambulation. Pt sitting up in bed upon arrival with RN present setting pt up with breakfast in bed. Pt agreeable to tx session and coming to EOB to eat breakfast. He ate seated EOB, demonstrated decreased awareness to L UE with divided attention tasks, however, able to use at dominant functional leve when attending to UE during set-up and self feeding. He donned pants seated EOB, stood from EOB with RW and min A to pull pants up and transition to w/c. Pt taken to ADL apartment total A in w/c for time and energy conserrvation. Demonstration and education provided regarding transfer method and technique for use of tub transfer bench. Following demonstration, pt return demonstrated and ambulated into bathroom with min A, from seated position able to lift B LEs over tub wall and slide in. Pt requiring assist of UEs to manage L LE over tub wall.  He then ambulated out of bathroom and transitioned to sitting in standard recliner, mod A for controlled descent into recliner. Following seated rest break, pt able to ambulate ~15 ft with min A, VCs for L LE awareness and to widen BOS. Pt returned to w/c and self propelled remainder of w/c with B LEs, VCs awareness and attention to L LE. Pt left seated in w/c at end of session, all needs in reach and chair belt on.   Therapy Documentation Precautions:  Precautions Precautions: Fall, Sternal Restrictions Weight Bearing Restrictions: No Other Position/Activity Restrictions: sternal precautions Pain:  No/denies pain ADL: ADL ADL Comments: Please see functional navigator  See Function Navigator for Current Functional Status.   Therapy/Group: Individual Therapy  Hind Chesler L 02/16/2018, 7:11 AM

## 2018-02-16 NOTE — Progress Notes (Signed)
ANTICOAGULATION CONSULT NOTE-Follow Up Note  Pharmacy Consult for warfarin Indication: atrial fibrillation  Allergies  Allergen Reactions  . Fentanyl Shortness Of Breath  . Promethazine Hcl Anaphylaxis and Other (See Comments)    Cardiac arrest  . Foeniculum Vulgare Other (See Comments)    Fennel bulbs--nausea only  . Metformin And Related Diarrhea  . Penicillins Nausea Only    Has patient had a PCN reaction causing immediate rash, facial/tongue/throat swelling, SOB or lightheadedness with hypotension: no Has patient had a PCN reaction causing severe rash involving mucus membranes or skin necrosis: no Has patient had a PCN reaction that required hospitalization: no Has patient had a PCN reaction occurring within the last 10 years: yes If all of the above answers are "NO", then may proceed with Cephalosporin use.   . Sulfa Antibiotics Other (See Comments)    G.I. Upset    Patient Measurements: Height: 5' 11.5" (181.6 cm) Weight: 187 lb 2.7 oz (84.9 kg) IBW/kg (Calculated) : 76.45  Vital Signs: Temp: 98 F (36.7 C) (08/22 0445) Temp Source: Oral (08/22 0445) BP: 98/61 (08/22 0445) Pulse Rate: 76 (08/22 0445)  Labs: Recent Labs    02/14/18 0939 02/15/18 0551 02/16/18 0452  HGB  --  9.7*  --   HCT  --  29.7*  --   PLT  --  287  --   LABPROT 24.6* 24.5* 23.5*  INR 2.25 2.23 2.11  CREATININE  --  0.97  --     Estimated Creatinine Clearance: 77.8 mL/min (by C-G formula based on SCr of 0.97 mg/dL).   Medical History: Past Medical History:  Diagnosis Date  . Atrial fibrillation with RVR (HCC) 05/01/2017  . Cardiomyopathy (HCC) 05/01/2017  . CHF (congestive heart failure) (HCC)   . 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 Coronary artery disease   . DM (diabetes mellitus) type 2, uncontrolled, with ketoacidosis (HCC) 05/01/2017  . Essential hypertension 11/26/2016  . History of kidney stones   . HLD (hyperlipidemia) 05/01/2017  . IVCD  (intraventricular conduction defect) 04/24/2015  . Mitral valve regurgitation   . Mixed dyslipidemia 11/26/2016  . 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 Pharmacy consulted to manage warfarin dosing.  INR today remains therapeutic at 2.11. No bleeding noted.  Goal of Therapy:  INR 2-3 Monitor platelets by anticoagulation protocol: Yes   Plan:  Warfarin 2.5 mg x 1 dose Monitor daily PT/INR  Brianda Beitler A. Jeanella CrazePierce, PharmD, BCPS Clinical Pharmacist [facility] Pager: [phone] Please utilize Amion for appropriate phone number to reach the unit pharmacist [facility]     02/16/2018   11:51 AM

## 2018-02-16 NOTE — Progress Notes (Signed)
Physical Therapy Session Note  Patient Details  Name: Nathan PhilipsRandleman D Melland Jr. MRN: [id number] Date of Birth: 1945/12/02  Today's Date: 02/16/2018 PT Individual Time: 1100-1200 PT Individual Time Calculation (min): 60 min   Short Term Goals: Week 2:  PT Short Term Goal 1 (Week 2): =LTG due to estimated LOS  Skilled Therapeutic Interventions/Progress Updates: Pt received seated in w/c with handoff from SLP; denies pain and agreeable to treatment. Transported totalA to gym. Gait forward/backward x25' each x2 trials with min guard, mod faded to min verbal/tactile cues for LLE foot clearance and symmetrical step length. Side stepping R/L 2x15' for focus on LLE coordination, hip abduction strengthening and weight shifting for carryover into gait. Gait R HHA x20' minA, +2 standing on L side for safety. Standing balance on airex foam pad x2 trials no UE support x15-20 sec each for focus on ankle strategy and righting reactions. Returned to room totalA. Stand pivot to bed min guard, S sit>supine. Remained in bed at end of session, four rails per pt request, alarm intact and all needs in reach.      Therapy Documentation Precautions:  Precautions Precautions: Fall, Sternal Restrictions Weight Bearing Restrictions: No Other Position/Activity Restrictions: sternal precautions Pain: Pain Assessment Pain Scale: 0-10 Pain Score: 0-No pain   See Function Navigator for Current Functional Status.   Therapy/Group: Individual Therapy  Nathan [name] 02/16/2018, 12:06 PM

## 2018-02-16 NOTE — Significant Event (Signed)
Hypoglycemic Event  CBG:  54  Treatment: 15 GM carbohydrate snack  Symptoms: None  Follow-up CBG: Time:1737 CBG Result:70  Possible Reasons for Event: Medication regimen: new medicaitons started this am  Comments/MD notified:Pt not symptomatic, tolerated standing order well  Scot JunKaren E Latrisa Hellums

## 2018-02-17 ENCOUNTER — Inpatient Hospital Stay (HOSPITAL_COMMUNITY): Payer: Self-pay

## 2018-02-17 ENCOUNTER — Inpatient Hospital Stay (HOSPITAL_COMMUNITY): Payer: Self-pay | Admitting: Speech Pathology

## 2018-02-17 ENCOUNTER — Inpatient Hospital Stay (HOSPITAL_COMMUNITY): Payer: Self-pay | Admitting: Physical Therapy

## 2018-02-17 DIAGNOSIS — E162 Hypoglycemia, unspecified: Secondary | ICD-10-CM

## 2018-02-17 LAB — GLUCOSE, CAPILLARY
GLUCOSE-CAPILLARY: 119 mg/dL — AB (ref 70–99)
GLUCOSE-CAPILLARY: 51 mg/dL — AB (ref 70–99)
Glucose-Capillary: 65 mg/dL — ABNORMAL LOW (ref 70–99)
Glucose-Capillary: 107 mg/dL — ABNORMAL HIGH (ref 70–99)
Glucose-Capillary: 120 mg/dL — ABNORMAL HIGH (ref 70–99)
Glucose-Capillary: 68 mg/dL — ABNORMAL LOW (ref 70–99)
Glucose-Capillary: 72 mg/dL (ref 70–99)

## 2018-02-17 LAB — PROTIME-INR
INR: 2.11
Prothrombin Time: 23.5 seconds — ABNORMAL HIGH (ref 11.4–15.2)

## 2018-02-17 MED ORDER — WARFARIN SODIUM 3 MG PO TABS
3.0000 mg | ORAL_TABLET | Freq: Once | ORAL | Status: AC
  Administered 2018-02-17: 3 mg via ORAL
  Filled 2018-02-17: qty 1

## 2018-02-17 NOTE — Progress Notes (Signed)
Social Work Patient ID: Nathan [name]., male   DOB: 19-Aug-1945, 72 y.o.   MRN: [id number]  Met with pt to discuss team conference progress and discharge plans. Have not heard from wife and have left messages for her. Made pt aware he will need someone with him at home. Due to memory issues and assist with ambulation. Pt feels they can manage with his son there and in and out. Wife is there very evening. Would need them to come in for education prior discharge. Also need to know what his wife is feeling regarding his discharge needs. Have left a message for her to call me today when she comes in to visit pt. Work on discharge plan for 8/29.

## 2018-02-17 NOTE — Significant Event (Signed)
Hypoglycemic Event  CBG: 65  Treatment: 15 GM carbohydrate snack  Symptoms: None  Follow-up CBG: Time:0750 CBG Result:119  Possible Reasons for Event: Unknown  Comments/MD notified:change in medications    Nathan JunKaren E [name] Bachmeier

## 2018-02-17 NOTE — Progress Notes (Signed)
Occupational Therapy Session Note  Patient Details  Name: Nathan [name]. MRN: [id number] Date of Birth: [birth year]  Today's Date: 02/17/2018 OT Individual Time: 4469-5072 OT Individual Time Calculation (min): 73 min    Short Term Goals: Week 1:  OT Short Term Goal 1 (Week 1): Pt will complete toilet transfer with mod A OT Short Term Goal 1 - Progress (Week 1): Met OT Short Term Goal 2 (Week 1): Pt will complete LB dressing with mod A  OT Short Term Goal 2 - Progress (Week 1): Met OT Short Term Goal 3 (Week 1): Pt will follow sternal precautions with min verbal cues within BADL session.  OT Short Term Goal 3 - Progress (Week 1): Met  Skilled Therapeutic Interventions/Progress Updates:    1:1. OT dons teds for time management. Pt supine>sitting with VC for sternal precautions. Pt completes stand pivot transfers with RW throughout session and sit to stand with MOD A overall with A for lifting and VC for anterior flexion for momentum. Pt compeletes bathing with A only for balance while standing for washing buttocks. Pt completes dressing with A for set up for shirt and VC for crossing into seatd figure 4 for threading BLE. Pt grooms at sink with supervision seated at w/c. Pt propels w/c with BLE to gym with ~5 rest breaks d/t decreased endurance to improve BLE strength required for functional transfers. Pt completes seated connect 4 d/t fatigue. Exited session with pt seated in w/c, call light in reach and belt alarm on  Therapy Documentation Precautions:  Precautions Precautions: Fall, Sternal Restrictions Weight Bearing Restrictions: No Other Position/Activity Restrictions: sternal precautions General:   Vital Signs:   Pain: Pain Assessment Pain Score: 0-No pain ADL: ADL ADL Comments: Please see functional navigator  See Function Navigator for Current Functional Status.   Therapy/Group: Individual Therapy  Tonny Branch 02/17/2018, 8:35 AM

## 2018-02-17 NOTE — Significant Event (Signed)
Hypoglycemic Event  CBG: 51  Treatment: 15 GM carbohydrate snack  Symptoms: None  Follow-up CBG: Time:2201 CBG Result:68  Possible Reasons for Event: Unknown  Comments/MD notified:no    Gelene MinkSusan J Nandika Stetzer

## 2018-02-17 NOTE — Progress Notes (Signed)
Speech Language Pathology Weekly Progress and Session Note  Patient Details  Name: Nathan [name]. MRN: [id number] Date of Birth: 1946/05/26  Beginning of progress report period: February 10, 2018  End of progress report period: February 17, 2018  Today's Date: 02/17/2018 SLP Individual Time: 1035-1130 SLP Individual Time Calculation (min): 55 min  Short Term Goals: Week 2: SLP Short Term Goal 1 (Week 2): Patient will complete semi-complex problem solving tasks with Min A verbal cues.  SLP Short Term Goal 1 - Progress (Week 2): Met SLP Short Term Goal 2 (Week 2): Patient will demonstrate recall of daily information with Min A verbal cues for use of compensatory strategies.  SLP Short Term Goal 2 - Progress (Week 2): Met SLP Short Term Goal 3 (Week 2): Patient will demonstrate selective attention in a moderately distracting enviornment for ~45 minutes with supervision verbal cues.  SLP Short Term Goal 3 - Progress (Week 2): Met SLP Short Term Goal 4 (Week 2): Patient will self-monitor and correct errors during functional tasks with Min A verbal cues.  SLP Short Term Goal 4 - Progress (Week 2): Met    New Short Term Goals: Week 3: SLP Short Term Goal 1 (Week 3): STG=LTG due to remaining length of stay  Weekly Progress Updates:   Pt has made functional gains this reporting period and has met 4 out of 4 short term goals.  Pt is currently min assist for tasks due to mild higher level cognitive deficits.  Pt has demonstrated improved use of memory compensatory strategies to facilitate recall of daily information.  Pt education is ongoing but no family has been present for training at this time.  Pt would continue to benefit from skilled ST while inpatient in order to maximize functional independence and reduce burden of care prior to discharge.  Anticipate that pt will need 24/7 supervision at discharge in addition to [facility] follow up at next level of care.      Intensity: Minumum of 1-2  x/day, 30 to 90 minutes Frequency: 3 to 5 out of 7 days Duration/Length of Stay: 12-14 days  Treatment/Interventions: Cognitive remediation/compensation;Environmental controls;Internal/external aids;Therapeutic Activities;Patient/family education;Cueing hierarchy;Functional tasks   Daily Session  Skilled Therapeutic Interventions: Pt was seen for skilled ST targeting cognitive goals.  Pt was seated upright in wheelchair with complaints of fatigue due to challenging OT therapy session earlier this morning but was pleasant and agreeable to participating in therapies.  SLP facilitated the session with a novel scheduling task to address problem solving goals.  Pt needed intermittent min assist verbal cues for use of organization strategies to complete task but was otherwise supervision for problem solving.  SLP increased task complexity with a semi-complex deductive reasoning puzzle.  Pt needed overall max assist to complete task but therapist suspects that fatigue was beginning to play a role as session progressed.  RN reports she had also given him pain meds prior to therapist's arrival which could have contributed to pt's increased difficulty.  Pt was returned to room and transferred back to bed with bed alarm set and call bell within reach.  Goals updated on this date to reflect current progress and plan of care.     Function:   Eating Eating                 Cognition Comprehension Comprehension assist level: Follows basic conversation/direction with extra time/assistive device  Expression   Expression assist level: Expresses complex ideas: With extra time/assistive device  Social Interaction  Social Interaction assist level: Interacts appropriately 90% of the time - Needs monitoring or encouragement for participation or interaction.  Problem Solving Problem solving assist level: Solves basic 90% of the time/requires cueing < 10% of the time  Memory Memory assist level: Recognizes or  recalls 75 - 89% of the time/requires cueing 10 - 24% of the time   General    Pain Pain Assessment Pain Scale: 0-10 Pain Score: 0-No pain  Therapy/Group: Individual Therapy  Chasidy Janak, Selinda Orion 02/17/2018, 1:22 PM

## 2018-02-17 NOTE — Progress Notes (Signed)
Physical Therapy Session Note  Patient Details  Name: Nathan PhilipsRandleman D Windhorst Jr. MRN: [id number] Date of Birth: 13-Apr-1946  Today's Date: 02/17/2018 PT Individual Time: 1345-1445 PT Individual Time Calculation (min): 60 min   Short Term Goals: Week 2:  PT Short Term Goal 1 (Week 2): =LTG due to estimated LOS  Skilled Therapeutic Interventions/Progress Updates: Pt received seated in bed, denies pain and agreeable to treatment. Supine>sit with S from flat bed with bedrail, increased time to initiate and cues for techniques. Gait x25' with RW and minA, min cues for attention to LLE for step length and foot clearance. Pt reports needing to sit d/t dizziness/light headedness; BP assessed 96/54. Encouraged po fluids throughout remainder of session. Stand pivot w/c>mat table min guard with RW. Sit <>stand 2 sets 5 reps no UE support, min guard; second set requires increased time d/t longer rest breaks between each rep. Standing endurance while engaged in connect four game; min guard/close S for balance, standing endurance of approximately 1.5-2 min at a time before requiring seated rest break. Gait x64' with RW and minA; pt requested seated rest break after approx 40' however able to complete trial to ADL apartment with encouragement. Sit >supine on flat bed minA for LLE management. Following prolonged rest break in supine, performed supine>sit with S after increased time to initiate and problem solve. Returned to room totalA; stand pivot to bed with RW and min guard. Sit >supine with S. Remained sidelying in bed, alarm intact and all needs in reach.      Therapy Documentation Precautions:  Precautions Precautions: Fall, Sternal Restrictions Weight Bearing Restrictions: No Other Position/Activity Restrictions: sternal precautions Pain: Pain Assessment Pain Scale: 0-10 Pain Score: 0-No pain   See Function Navigator for Current Functional Status.   Therapy/Group: Individual Therapy  [name]  [name] 02/17/2018, 3:44 PM

## 2018-02-17 NOTE — Progress Notes (Signed)
ANTICOAGULATION CONSULT NOTE-Follow Up Note  Pharmacy Consult for warfarin Indication: atrial fibrillation  Allergies  Allergen Reactions  . Fentanyl Shortness Of Breath  . Promethazine Hcl Anaphylaxis and Other (See Comments)    Cardiac arrest  . Foeniculum Vulgare Other (See Comments)    Fennel bulbs--nausea only  . Metformin And Related Diarrhea  . Penicillins Nausea Only    Has patient had a PCN reaction causing immediate rash, facial/tongue/throat swelling, SOB or lightheadedness with hypotension: no Has patient had a PCN reaction causing severe rash involving mucus membranes or skin necrosis: no Has patient had a PCN reaction that required hospitalization: no Has patient had a PCN reaction occurring within the last 10 years: yes If all of the above answers are "NO", then may proceed with Cephalosporin use.   . Sulfa Antibiotics Other (See Comments)    G.I. Upset    Patient Measurements: Height: 5' 11.5" (181.6 cm) Weight: 183 lb 6.8 oz (83.2 kg) IBW/kg (Calculated) : 76.45  Vital Signs: Temp: 97.8 F (36.6 C) (08/23 0351) Temp Source: Oral (08/23 0351) BP: 107/69 (08/23 0351) Pulse Rate: 78 (08/23 0351)  Labs: Recent Labs    02/15/18 0551 02/16/18 0452 02/17/18 0457  HGB 9.7*  --   --   HCT 29.7*  --   --   PLT 287  --   --   LABPROT 24.5* 23.5* 23.5*  INR 2.23 2.11 2.11  CREATININE 0.97  --   --     Estimated Creatinine Clearance: 77.8 mL/min (by C-G formula based on SCr of 0.97 mg/dL).   Medical History: Past Medical History:  Diagnosis Date  . Atrial fibrillation with RVR (HCC) 05/01/2017  . Cardiomyopathy (HCC) 05/01/2017  . CHF (congestive heart failure) (HCC)   . 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 Coronary artery disease   . DM (diabetes mellitus) type 2, uncontrolled, with ketoacidosis (HCC) 05/01/2017  . Essential hypertension 11/26/2016  . History of kidney stones   . HLD (hyperlipidemia) 05/01/2017  .  IVCD (intraventricular conduction defect) 04/24/2015  . Mitral valve regurgitation   . Mixed dyslipidemia 11/26/2016  . 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 Pharmacy consulted to manage warfarin dosing. Warf recently held 8/15 thru 8/17 due to INR >3.0. PO intake is variable 40-100%.   INR today remains therapeutic at 2.11. No bleeding noted.  Goal of Therapy:  INR 2-3 Monitor platelets by anticoagulation protocol: Yes   Plan:  Give warfarin 3 mg po x 1 Monitor daily INR, CBC, clinical course, s/sx of bleed, PO intake, DDI    Thank you for allowing us to participate in this patients care.   Signe Coltonya C Chauntae Hults, PharmD Please utilize Amion (under [facility]) for appropriate number for your unit pharmacist. 02/17/2018 1:35 PM

## 2018-02-17 NOTE — Progress Notes (Signed)
Subjective/Complaints: Patient seen sitting up in bed this morning. He states he slept fairly overnight due to the fact that he had to have a bowel movement. However, he states he states it was a good bowel movement.  ROS: Denies CP, SOB, N/V/D  Objective: Vital Signs: Blood pressure 107/69, pulse 78, temperature 97.8 F (36.6 C), temperature source Oral, resp. rate 18, height 5' 11.5" (1.816 m), weight 83.2 kg, SpO2 99 %. No results found. Results for orders placed or performed during the hospital encounter of 02/03/18 (from the past 72 hour(s))  Protime-INR     Status: Abnormal   Collection Time: 02/14/18  9:39 AM  Result Value Ref Range   Prothrombin Time 24.6 (H) 11.4 - 15.2 seconds   INR 2.25     Comment: Performed at [facility] [address], [place], [place] [address]  Glucose, capillary     Status: Abnormal   Collection Time: 02/14/18 11:38 AM  Result Value Ref Range   Glucose-Capillary 236 (H) 70 - 99 mg/dL  Glucose, capillary     Status: None   Collection Time: 02/14/18  4:49 PM  Result Value Ref Range   Glucose-Capillary 79 70 - 99 mg/dL  Glucose, capillary     Status: Abnormal   Collection Time: 02/14/18  9:04 PM  Result Value Ref Range   Glucose-Capillary 180 (H) 70 - 99 mg/dL  Protime-INR     Status: Abnormal   Collection Time: 02/15/18  5:51 AM  Result Value Ref Range   Prothrombin Time 24.5 (H) 11.4 - 15.2 seconds   INR 2.23     Comment: Performed at [facility], [place] [address] [place], [place] [address]  Basic metabolic panel     Status: Abnormal   Collection Time: 02/15/18  5:51 AM  Result Value Ref Range   Sodium 136 135 - 145 mmol/L   Potassium 4.6 3.5 - 5.1 mmol/L   Chloride 102 98 - 111 mmol/L   CO2 26 22 - 32 mmol/L   Glucose, Bld 117 (H) 70 - 99 mg/dL   BUN 14 8 - 23 mg/dL   Creatinine, Ser 0.97 0.61 - 1.24 mg/dL   Calcium 8.8 (L) 8.9 - 10.3 mg/dL   GFR calc non Af Amer >60 >60 mL/min   GFR calc Af Amer >60 >60 mL/min     Comment: (NOTE) The eGFR has been calculated using the CKD EPI equation. This calculation has not been validated in all clinical situations. eGFR's persistently <60 mL/min signify possible Chronic Kidney Disease.    Anion gap 8 5 - 15    Comment: Performed at [facility] [address] [place] [address]  CBC     Status: Abnormal   Collection Time: 02/15/18  5:51 AM  Result Value Ref Range   WBC 6.2 4.0 - 10.5 K/uL   RBC 3.27 (L) 4.22 - 5.81 MIL/uL   Hemoglobin 9.7 (L) 13.0 - 17.0 g/dL   HCT 29.7 (L) 39.0 - 52.0 %   MCV 90.8 78.0 - 100.0 fL   MCH 29.7 26.0 - 34.0 pg   MCHC 32.7 30.0 - 36.0 g/dL   RDW 12.9 11.5 - 15.5 %   Platelets 287 150 - 400 K/uL    Comment: Performed at [facility], [place] [address], [place], [place] [address]  Glucose, capillary     Status: Abnormal   Collection Time: 02/15/18  6:46 AM  Result Value Ref Range  Glucose-Capillary 122 (H) 70 - 99 mg/dL   Comment 1 Notify RN   Glucose, capillary     Status: Abnormal   Collection Time: 02/15/18 12:00 PM  Result Value Ref Range   Glucose-Capillary 178 (H) 70 - 99 mg/dL   Comment 1 Notify RN   Glucose, capillary     Status: Abnormal   Collection Time: 02/15/18  4:39 PM  Result Value Ref Range   Glucose-Capillary 104 (H) 70 - 99 mg/dL   Comment 1 Notify RN   Glucose, capillary     Status: Abnormal   Collection Time: 02/15/18  9:19 PM  Result Value Ref Range   Glucose-Capillary 154 (H) 70 - 99 mg/dL  Protime-INR     Status: Abnormal   Collection Time: 02/16/18  4:52 AM  Result Value Ref Range   Prothrombin Time 23.5 (H) 11.4 - 15.2 seconds   INR 2.11     Comment: Performed at [facility], [place] [address], [place], [place] [address]  Glucose, capillary     Status: Abnormal   Collection Time: 02/16/18  6:50 AM  Result Value Ref Range   Glucose-Capillary 110 (H) 70 - 99 mg/dL  Glucose, capillary     Status: Abnormal   Collection Time: 02/16/18 11:57 AM  Result Value Ref Range    Glucose-Capillary 172 (H) 70 - 99 mg/dL  Glucose, capillary     Status: Abnormal   Collection Time: 02/16/18  4:59 PM  Result Value Ref Range   Glucose-Capillary 54 (L) 70 - 99 mg/dL  Glucose, capillary     Status: None   Collection Time: 02/16/18  5:37 PM  Result Value Ref Range   Glucose-Capillary 70 70 - 99 mg/dL  Glucose, capillary     Status: None   Collection Time: 02/16/18 10:33 PM  Result Value Ref Range   Glucose-Capillary 82 70 - 99 mg/dL   Comment 1 Notify RN   Protime-INR     Status: Abnormal   Collection Time: 02/17/18  4:57 AM  Result Value Ref Range   Prothrombin Time 23.5 (H) 11.4 - 15.2 seconds   INR 2.11     Comment: Performed at [facility], [place] [address], [place], [place] [address]  Glucose, capillary     Status: Abnormal   Collection Time: 02/17/18  7:00 AM  Result Value Ref Range   Glucose-Capillary 65 (L) 70 - 99 mg/dL  Glucose, capillary     Status: Abnormal   Collection Time: 02/17/18  7:52 AM  Result Value Ref Range   Glucose-Capillary 119 (H) 70 - 99 mg/dL     Constitutional: No distress . Vital signs reviewed. HENT: Normocephalic.  Atraumatic. Eyes: EOMI. No discharge. Cardiovascular: RRR. No JVD. Respiratory: CTA bilaterally. Normal effort. GI: BS +. Non-distended. Musc: No edema or tenderness in extremities. Neuro: Alert and Oriented Motor: right upper extremity: 5/5 proximal distal Right lower extremity: 4+/5 proximal distal Left upper extremity: 4+5/5 proximal distal (unchanged) Left lower extremity: 4/5 proximal distal (unchanged) Skin:   Intact. Warm and dry.  Assessment/Plan: 1. Functional deficits secondary to RIght MCA infarct  which require 3+ hours per day of interdisciplinary therapy in a comprehensive inpatient rehab setting. Physiatrist is providing close team supervision and 24 hour management of active medical problems listed below. Physiatrist and rehab team continue to assess barriers to discharge/monitor patient  progress toward functional and medical goals. FIM: Function - Bathing Position: Shower Body parts bathed by patient: Right arm, Left arm, Chest,  Abdomen, Right upper leg, Left upper leg, Right lower leg, Left lower leg, Front perineal area, Buttocks, Back Body parts bathed by helper: Buttocks, Back Bathing not applicable: Front perineal area, Buttocks, Right upper leg, Left upper leg, Right lower leg, Left lower leg(Declined LB bathing) Assist Level: Supervision or verbal cues  Function- Upper Body Dressing/Undressing What is the patient wearing?: Pull over shirt/dress Pull over shirt/dress - Perfomed by patient: Thread/unthread right sleeve, Thread/unthread left sleeve, Pull shirt over trunk, Put head through opening Pull over shirt/dress - Perfomed by helper: Put head through opening, Pull shirt over trunk Assist Level: Supervision or verbal cues Set up : To obtain clothing/put away Function - Lower Body Dressing/Undressing What is the patient wearing?: Pants, Shoes Position: Wheelchair/chair at sink Pants- Performed by patient: Thread/unthread right pants leg, Thread/unthread left pants leg, Pull pants up/down Pants- Performed by helper: Thread/unthread right pants leg, Thread/unthread left pants leg, Pull pants up/down Non-skid slipper socks- Performed by patient: Don/doff right sock, Don/doff left sock Non-skid slipper socks- Performed by helper: Don/doff right sock, Don/doff left sock Shoes - Performed by patient: Don/doff right shoe, Don/doff left shoe, Fasten right, Fasten left Shoes - Performed by helper: Fasten right, Fasten left TED Hose - Performed by helper: Don/doff right TED hose, Don/doff left TED hose Assist for footwear: Supervision/touching assist Assist for lower body dressing: Touching or steadying assistance (Pt > 75%)  Function - Toileting Toileting activity did not occur: No continent bowel/bladder event Toileting steps completed by helper: Adjust clothing prior  to toileting, Performs perineal hygiene, Adjust clothing after toileting Toileting Assistive Devices: Other (comment)(foley cath) Assist level: Touching or steadying assistance (Pt.75%)  Function - Toilet Transfers Toilet transfer activity did not occur: Refused Toilet transfer assistive device: Elevated toilet seat/BSC over toilet, Walker Assist level to toilet: Moderate assist (Pt 50 - 74%/lift or lower) Assist level from toilet: Moderate assist (Pt 50 - 74%/lift or lower) Assist level to bedside commode (at bedside): Touching or steadying assistance (Pt > 75%) Assist level from bedside commode (at bedside): Touching or steadying assistance (Pt > 75%)  Function - Chair/bed transfer Chair/bed transfer method: Stand pivot Chair/bed transfer assist level: Touching or steadying assistance (Pt > 75%) Chair/bed transfer assistive device: Walker Chair/bed transfer details: Verbal cues for sequencing, Verbal cues for technique, Verbal cues for precautions/safety, Manual facilitation for weight shifting, Manual facilitation for placement  Function - Locomotion: Wheelchair Will patient use wheelchair at discharge?: No Function - Locomotion: Ambulation Ambulation activity did not occur: Safety/medical concerns Assistive device: Hand held assist, Rail in hallway Max distance: 25 Assist level: Touching or steadying assistance (Pt > 75%) Assist level: Touching or steadying assistance (Pt > 75%)  Function - Comprehension Comprehension: Auditory Comprehension assist level: Follows basic conversation/direction with extra time/assistive device  Function - Expression Expression: Verbal Expression assist level: Expresses complex ideas: With extra time/assistive device  Function - Social Interaction Social Interaction assist level: Interacts appropriately 75 - 89% of the time - Needs redirection for appropriate language or to initiate interaction.  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 DVT Prophylaxis/Anticoagulation: Pharmaceutical:Coumadin per pharm protocol   INR therapeutic on 8/22 3. Pain Management:prn tylenol. Oxycodone or tramadol for more severe pain 4. Mood:LCSW to follow for evaluation and support. 5. Neuropsych: This patientis fullycapable of making decisions on hisown behalf. 6. Skin/Wound Care:Added protein supplements between meals 7. Fluids/Electrolytes/Nutrition:Strict I/O. Intake poor due to recent teeth extraction.  8. CAD s/p CABG with MVR: Monitor for symptoms with activity. On coumadin and Lipitor--coreg added  9.PAF: Cont warfarin, coreg  10. R-MCA stroke with extension: Left sided weakness resolving. On coumadin.  11. Chronic systolic CHF: Encourage IS. Monitor weights daily with strict I/O. Cont meds Filed Weights   02/15/18 0405 02/16/18 0627 02/17/18 0428  Weight: 85.3 kg 84.9 kg 83.2 kg   Stable on 8/23 12. H/o BPH/Acute Urinary retention:   Cont foley at present per urology until outpatient follow up 13. Constipation: Increased mirlax to bid 14. ABLA/ Continue to monitor H/H for recovery.    Hemoglobin 9.7 on 8/21  Continue to monitor 15. Hyponatremia: Continue to monitor for now.   Sodium 136 on 8/21  Continue to monitor 16. T2DM: Monitor BS ac/hs.   Levemir daily changed to glipizide and repaglinide on 8/21  SSI for tighter BS control. CBG (last 3)  Recent Labs    02/16/18 2233 02/17/18 0700 02/17/18 0752  GLUCAP 82 65* 119*   Hypoglycemia  on 8/23, will consider medication adjustments if persistently given recent change  A FACE TO FACE EVALUATION WAS PERFORMED Lulu Hirschmann Lorie Phenix 02/17/2018, 8:50 AM

## 2018-02-17 NOTE — Significant Event (Signed)
Hypoglycemic Event  CBG: 65  Treatment: 15 GM carbohydrate snack  Symptoms: None  Follow-up CBG: Time:0730 CBG Result:119 Possible Reasons for Event: Unknown  Comments/MD notified:yes    Nathan MinkSusan J Jerriyah [name]

## 2018-02-18 ENCOUNTER — Inpatient Hospital Stay (HOSPITAL_COMMUNITY): Payer: Self-pay | Admitting: Occupational Therapy

## 2018-02-18 ENCOUNTER — Inpatient Hospital Stay (HOSPITAL_COMMUNITY): Payer: Self-pay

## 2018-02-18 LAB — CBC
HCT: 31.3 % — ABNORMAL LOW (ref 39.0–52.0)
HEMOGLOBIN: 10.2 g/dL — AB (ref 13.0–17.0)
MCH: 29.7 pg (ref 26.0–34.0)
MCHC: 32.6 g/dL (ref 30.0–36.0)
MCV: 91.3 fL (ref 78.0–100.0)
PLATELETS: 242 10*3/uL (ref 150–400)
RBC: 3.43 MIL/uL — AB (ref 4.22–5.81)
RDW: 13.1 % (ref 11.5–15.5)
WBC: 8.4 10*3/uL (ref 4.0–10.5)

## 2018-02-18 LAB — BASIC METABOLIC PANEL
ANION GAP: 8 (ref 5–15)
BUN: 13 mg/dL (ref 8–23)
CALCIUM: 8.9 mg/dL (ref 8.9–10.3)
CO2: 24 mmol/L (ref 22–32)
CREATININE: 0.98 mg/dL (ref 0.61–1.24)
Chloride: 104 mmol/L (ref 98–111)
GFR calc non Af Amer: >60 mL/min (ref >60)
Glucose, Bld: 51 mg/dL — ABNORMAL LOW (ref 70–99)
Potassium: 4.1 mmol/L (ref 3.5–5.1)
SODIUM: 136 mmol/L (ref 135–145)

## 2018-02-18 LAB — GLUCOSE, CAPILLARY
GLUCOSE-CAPILLARY: 41 mg/dL — AB (ref 70–99)
GLUCOSE-CAPILLARY: 60 mg/dL — AB (ref 70–99)
GLUCOSE-CAPILLARY: 78 mg/dL (ref 70–99)
GLUCOSE-CAPILLARY: 87 mg/dL (ref 70–99)
GLUCOSE-CAPILLARY: 87 mg/dL (ref 70–99)
Glucose-Capillary: 83 mg/dL (ref 70–99)

## 2018-02-18 LAB — PROTIME-INR
INR: 2.14
PROTHROMBIN TIME: 23.7 s — AB (ref 11.4–15.2)

## 2018-02-18 MED ORDER — WARFARIN SODIUM 3 MG PO TABS
3.0000 mg | ORAL_TABLET | Freq: Once | ORAL | Status: AC
  Administered 2018-02-18: 3 mg via ORAL
  Filled 2018-02-18: qty 1

## 2018-02-18 NOTE — Significant Event (Signed)
Hypoglycemic Event  CBG: 68  Treatment: 15 GM carbohydrate snack  Symptoms: None  Follow-up CBG: Time:*2226 CBG Result:72  Possible Reasons for Event: Unknown  Comments/MD notified:no    Gelene MinkSusan J Kelina Beauchamp

## 2018-02-18 NOTE — Significant Event (Signed)
Hypoglycemic Event  CBG: 41  Treatment: 15 GM carbohydrate snack  Symptoms: None  Follow-up CBG: [id number] CBG Result:60  Possible Reasons for Event: Unknown  Comments/MD notified:yes     Gelene MinkSusan J Janei Scheff

## 2018-02-18 NOTE — Progress Notes (Signed)
Occupational Therapy Session Note  Patient Details  Name: [name] MRN: [id number] Date of Birth: [birth year]  Today's Date: 02/18/2018 OT Individual Time: 0921-1011 OT Individual Time Calculation (min): 50 min  10 minutes missed due to fatigue  Skilled Therapeutic Interventions/Progress Updates:    Pt greeted supine in bed. Reported significant fatigue due to hypoglycemia this AM. Declining BADL participation. With encouragement, he was amenable to EOB therapy. Supervision supine<sit with cues for sternal precautions. For remainder of tx worked on activity tolerance and sitting balance via participate in PVC pipe tree assemblage. He reported this task reminded him of work in the past and appeared cheerful while constructing. Near end of tx he reported feeling "wore out" and wanted to return to bed. Mod A for return to supine and +2 assist for repositioning for comfort to adhere to sternal precaution . He was left with all needs within reach. 10 minutes missed due to fatigue.  Therapy Documentation Precautions:  Precautions Precautions: Fall, Sternal Restrictions Weight Bearing Restrictions: No Other Position/Activity Restrictions: sternal precautions : General OT Amount of Missed Time: 10 Minutes Pain: No c/o pain during tx  Pain Assessment Pain Scale: 0-10 Pain Score: 0-No pain ADL: ADL ADL Comments: Please see functional navigator  See Function Navigator for Current Functional Status.   Therapy/Group: Individual Therapy  Nathan [name] 02/18/2018, 12:51 PM

## 2018-02-18 NOTE — Significant Event (Signed)
Hypoglycemic Event  CBG: 60  Treatment: 15 GM carbohydrate snack  Symptoms: None  Follow-up CBG: Time:0735 CBG Result:78  Possible Reasons for Event: Medication regimen: Levimere stopped po started - Levimere still in system  Comments/MD notified:*yes   Nathan [name]

## 2018-02-18 NOTE — Progress Notes (Signed)
ANTICOAGULATION CONSULT NOTE-Follow Up Note  Pharmacy Consult for warfarin Indication: atrial fibrillation  Allergies  Allergen Reactions  . Fentanyl Shortness Of Breath  . Promethazine Hcl Anaphylaxis and Other (See Comments)    Cardiac arrest  . Foeniculum Vulgare Other (See Comments)    Fennel bulbs--nausea only  . Metformin And Related Diarrhea  . Penicillins Nausea Only    Has patient had a PCN reaction causing immediate rash, facial/tongue/throat swelling, SOB or lightheadedness with hypotension: no Has patient had a PCN reaction causing severe rash involving mucus membranes or skin necrosis: no Has patient had a PCN reaction that required hospitalization: no Has patient had a PCN reaction occurring within the last 10 years: yes If all of the above answers are "NO", then may proceed with Cephalosporin use.   . Sulfa Antibiotics Other (See Comments)    G.I. Upset    Patient Measurements: Height: 5' 11.5" (181.6 cm) Weight: 186 lb 4.6 oz (84.5 kg) IBW/kg (Calculated) : 76.45  Vital Signs: Temp: 98.2 F (36.8 C) (08/24 0637) Temp Source: Oral (08/24 0637) BP: 106/67 (08/24 [id number]) Pulse Rate: 73 (08/24 0637)  Labs: Recent Labs    02/16/18 0452 02/17/18 0457 02/18/18 0445  HGB  --   --  10.2*  HCT  --   --  31.3*  PLT  --   --  242  LABPROT 23.5* 23.5* 23.7*  INR 2.11 2.11 2.14  CREATININE  --   --  0.98    Estimated Creatinine Clearance: 77 mL/min (by C-G formula based on SCr of 0.98 mg/dL).   Medical History: Past Medical History:  Diagnosis Date  . Atrial fibrillation with RVR (HCC) 05/01/2017  . Cardiomyopathy (HCC) 05/01/2017  . CHF (congestive heart failure) (HCC)   . 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 Coronary artery disease   . DM (diabetes mellitus) type 2, uncontrolled, with ketoacidosis (HCC) 05/01/2017  . Essential hypertension 11/26/2016  . History of kidney stones   . HLD (hyperlipidemia) 05/01/2017  .  IVCD (intraventricular conduction defect) 04/24/2015  . Mitral valve regurgitation   . Mixed dyslipidemia 11/26/2016  . 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 Pharmacy consulted to manage warfarin dosing. Warf recently held 8/15 thru 8/17 due to INR >3.0. PO intake is variable 25-75%.   INR today remains therapeutic at 2.14. Per nursing no signs/symtoms of bleeding noted.  Goal of Therapy:  INR 2-3 Monitor platelets by anticoagulation protocol: Yes   Plan:  Give warfarin 3 mg po x 1 Monitor daily INR, CBC, clinical course, s/sx of bleed, PO intake, DDI    Thank you for allowing us to participate in this patients care.   Amanda PeaAnna Love, Ilda BassetPharm D PGY1 Pharmacy Resident  Please use [facility]ION for clinical pharmacists numbers  02/18/2018      9:48 AM

## 2018-02-18 NOTE — Progress Notes (Signed)
Subjective/Complaints: Pt seen laying in bed this AM.  He slept well overnight and states he feels good.  ROS: Denies CP, SOB, N/V/D  Objective: Vital Signs: Blood pressure 90/66, pulse 86, temperature 98 F (36.7 C), temperature source Oral, resp. rate 16, height 5' 11.5" (1.816 m), weight 84.5 kg, SpO2 100 %. No results found. Results for orders placed or performed during the hospital encounter of 02/03/18 (from the past 72 hour(s))  Glucose, capillary     Status: Abnormal   Collection Time: 02/15/18  9:19 PM  Result Value Ref Range   Glucose-Capillary 154 (H) 70 - 99 mg/dL  Protime-INR     Status: Abnormal   Collection Time: 02/16/18  4:52 AM  Result Value Ref Range   Prothrombin Time 23.5 (H) 11.4 - 15.2 seconds   INR 2.11     Comment: Performed at [facility] [address] [place], [place] [address]  Glucose, capillary     Status: Abnormal   Collection Time: 02/16/18  6:50 AM  Result Value Ref Range   Glucose-Capillary 110 (H) 70 - 99 mg/dL  Glucose, capillary     Status: Abnormal   Collection Time: 02/16/18 11:57 AM  Result Value Ref Range   Glucose-Capillary 172 (H) 70 - 99 mg/dL  Glucose, capillary     Status: Abnormal   Collection Time: 02/16/18  4:59 PM  Result Value Ref Range   Glucose-Capillary 54 (L) 70 - 99 mg/dL  Glucose, capillary     Status: None   Collection Time: 02/16/18  5:37 PM  Result Value Ref Range   Glucose-Capillary 70 70 - 99 mg/dL  Glucose, capillary     Status: None   Collection Time: 02/16/18 10:33 PM  Result Value Ref Range   Glucose-Capillary 82 70 - 99 mg/dL   Comment 1 Notify RN   Protime-INR     Status: Abnormal   Collection Time: 02/17/18  4:57 AM  Result Value Ref Range   Prothrombin Time 23.5 (H) 11.4 - 15.2 seconds   INR 2.11     Comment: Performed at [facility], [place] [address] [place], [place] [address]  Glucose, capillary     Status: Abnormal   Collection Time: 02/17/18  7:00 AM  Result Value Ref Range    Glucose-Capillary 65 (L) 70 - 99 mg/dL  Glucose, capillary     Status: Abnormal   Collection Time: 02/17/18  7:52 AM  Result Value Ref Range   Glucose-Capillary 119 (H) 70 - 99 mg/dL  Glucose, capillary     Status: Abnormal   Collection Time: 02/17/18 11:35 AM  Result Value Ref Range   Glucose-Capillary 107 (H) 70 - 99 mg/dL  Glucose, capillary     Status: Abnormal   Collection Time: 02/17/18  4:35 PM  Result Value Ref Range   Glucose-Capillary 120 (H) 70 - 99 mg/dL  Glucose, capillary     Status: Abnormal   Collection Time: 02/17/18  9:18 PM  Result Value Ref Range   Glucose-Capillary 51 (L) 70 - 99 mg/dL   Comment 1 Notify RN    Comment 2 Document in Chart   Glucose, capillary     Status: Abnormal   Collection Time: 02/17/18 10:01 PM  Result Value Ref Range   Glucose-Capillary 68 (L) 70 - 99 mg/dL   Comment 1 Notify RN    Comment 2 Document in Chart   Glucose, capillary     Status: None   Collection Time: 02/17/18 10:26  PM  Result Value Ref Range   Glucose-Capillary 72 70 - 99 mg/dL  Protime-INR     Status: Abnormal   Collection Time: 02/18/18  4:45 AM  Result Value Ref Range   Prothrombin Time 23.7 (H) 11.4 - 15.2 seconds   INR 2.14     Comment: Performed at [facility] [address], [place], [place] [address]  Basic metabolic panel     Status: Abnormal   Collection Time: 02/18/18  4:45 AM  Result Value Ref Range   Sodium 136 135 - 145 mmol/L   Potassium 4.1 3.5 - 5.1 mmol/L   Chloride 104 98 - 111 mmol/L   CO2 24 22 - 32 mmol/L   Glucose, Bld 51 (L) 70 - 99 mg/dL   BUN 13 8 - 23 mg/dL   Creatinine, Ser 0.98 0.61 - 1.24 mg/dL   Calcium 8.9 8.9 - 10.3 mg/dL   GFR calc non Af Amer >60 >60 mL/min   GFR calc Af Amer >60 >60 mL/min    Comment: (NOTE) The eGFR has been calculated using the CKD EPI equation. This calculation has not been validated in all clinical situations. eGFR's persistently <60 mL/min signify possible Chronic Kidney Disease.    Anion  gap 8 5 - 15    Comment: Performed at [facility] [address], [place],  [address]  CBC     Status: Abnormal   Collection Time: 02/18/18  4:45 AM  Result Value Ref Range   WBC 8.4 4.0 - 10.5 K/uL   RBC 3.43 (L) 4.22 - 5.81 MIL/uL   Hemoglobin 10.2 (L) 13.0 - 17.0 g/dL   HCT 31.3 (L) 39.0 - 52.0 %   MCV 91.3 78.0 - 100.0 fL   MCH 29.7 26.0 - 34.0 pg   MCHC 32.6 30.0 - 36.0 g/dL   RDW 13.1 11.5 - 15.5 %   Platelets 242 150 - 400 K/uL    Comment: Performed at [facility], [place] [address], [place], [place] [address]  Glucose, capillary     Status: Abnormal   Collection Time: 02/18/18  6:29 AM  Result Value Ref Range   Glucose-Capillary 41 (LL) 70 - 99 mg/dL  Glucose, capillary     Status: Abnormal   Collection Time: 02/18/18  6:58 AM  Result Value Ref Range   Glucose-Capillary 60 (L) 70 - 99 mg/dL  Glucose, capillary     Status: None   Collection Time: 02/18/18  7:35 AM  Result Value Ref Range   Glucose-Capillary 78 70 - 99 mg/dL  Glucose, capillary     Status: None   Collection Time: 02/18/18 11:55 AM  Result Value Ref Range   Glucose-Capillary 87 70 - 99 mg/dL  Glucose, capillary     Status: None   Collection Time: 02/18/18  4:43 PM  Result Value Ref Range   Glucose-Capillary 87 70 - 99 mg/dL     Constitutional: No distress . Vital signs reviewed. HENT: Normocephalic.  Atraumatic. Eyes: EOMI. No discharge. Cardiovascular: RRR. No JVD. Respiratory: CTA bilaterally. Normal effort. GI: BS +. Non-distended. Musc: No edema or tenderness in extremities. Neuro: Alert and Oriented Motor:  Left upper extremity: 4+5/5 proximal distal (stable) Left lower extremity: 4/5 proximal distal (stable) Skin:   Intact. Warm and dry.  Assessment/Plan: 1. Functional deficits secondary to RIght MCA infarct  which require 3+ hours per day of interdisciplinary therapy in a comprehensive inpatient rehab setting. Physiatrist is providing close team supervision and  24  hour management of active medical problems listed below. Physiatrist and rehab team continue to assess barriers to discharge/monitor patient progress toward functional and medical goals. FIM: Function - Bathing Position: Shower Body parts bathed by patient: Right arm, Left arm, Chest, Abdomen, Right upper leg, Left upper leg, Right lower leg, Left lower leg, Front perineal area, Buttocks, Back Body parts bathed by helper: Buttocks, Back Bathing not applicable: Front perineal area, Buttocks, Right upper leg, Left upper leg, Right lower leg, Left lower leg(Declined LB bathing) Assist Level: Supervision or verbal cues  Function- Upper Body Dressing/Undressing What is the patient wearing?: Pull over shirt/dress Pull over shirt/dress - Perfomed by patient: Thread/unthread right sleeve, Thread/unthread left sleeve, Pull shirt over trunk, Put head through opening Pull over shirt/dress - Perfomed by helper: Put head through opening, Pull shirt over trunk Assist Level: Supervision or verbal cues Set up : To obtain clothing/put away Function - Lower Body Dressing/Undressing What is the patient wearing?: Pants, Shoes Position: Wheelchair/chair at sink Pants- Performed by patient: Thread/unthread right pants leg, Thread/unthread left pants leg, Pull pants up/down Pants- Performed by helper: Thread/unthread right pants leg, Thread/unthread left pants leg, Pull pants up/down Non-skid slipper socks- Performed by patient: Don/doff right sock, Don/doff left sock Non-skid slipper socks- Performed by helper: Don/doff right sock, Don/doff left sock Shoes - Performed by patient: Don/doff right shoe, Don/doff left shoe, Fasten right, Fasten left Shoes - Performed by helper: Fasten right, Fasten left TED Hose - Performed by helper: Don/doff right TED hose, Don/doff left TED hose Assist for footwear: Supervision/touching assist Assist for lower body dressing: Touching or steadying assistance (Pt >  75%)  Function - Toileting Toileting activity did not occur: No continent bowel/bladder event Toileting steps completed by helper: Adjust clothing prior to toileting, Performs perineal hygiene, Adjust clothing after toileting Toileting Assistive Devices: Other (comment)(foley cath) Assist level: Touching or steadying assistance (Pt.75%)  Function - Toilet Transfers Toilet transfer activity did not occur: Refused Toilet transfer assistive device: Elevated toilet seat/BSC over toilet, Walker Assist level to toilet: Moderate assist (Pt 50 - 74%/lift or lower) Assist level from toilet: Moderate assist (Pt 50 - 74%/lift or lower) Assist level to bedside commode (at bedside): Touching or steadying assistance (Pt > 75%) Assist level from bedside commode (at bedside): Touching or steadying assistance (Pt > 75%)  Function - Chair/bed transfer Chair/bed transfer method: Stand pivot Chair/bed transfer assist level: Touching or steadying assistance (Pt > 75%) Chair/bed transfer assistive device: Walker Chair/bed transfer details: Verbal cues for sequencing, Verbal cues for technique, Verbal cues for precautions/safety, Manual facilitation for weight shifting, Manual facilitation for placement  Function - Locomotion: Wheelchair Will patient use wheelchair at discharge?: No Type: Manual Max wheelchair distance: 50 Assist Level: Supervision or verbal cues Assist Level: Supervision or verbal cues Function - Locomotion: Ambulation Ambulation activity did not occur: Safety/medical concerns Assistive device: Walker-rolling Max distance: 40 Assist level: Touching or steadying assistance (Pt > 75%) Assist level: Touching or steadying assistance (Pt > 75%) Assist level: Touching or steadying assistance (Pt > 75%)  Function - Comprehension Comprehension: Auditory Comprehension assist level: Follows basic conversation/direction with extra time/assistive device  Function - Expression Expression:  Verbal Expression assist level: Expresses complex ideas: With extra time/assistive device  Function - Social Interaction Social Interaction assist level: Interacts appropriately 75 - 89% of the time - Needs redirection for appropriate language or to initiate interaction.  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 DVT Prophylaxis/Anticoagulation: Pharmaceutical:Coumadin per pharm protocol   INR therapeutic on 8/22 3. Pain Management:prn tylenol. Oxycodone or tramadol for more severe pain 4. Mood:LCSW to follow for evaluation and support. 5. Neuropsych: This patientis fullycapable of making decisions on hisown behalf. 6. Skin/Wound Care:Added protein supplements between meals 7. Fluids/Electrolytes/Nutrition:Strict I/O. Intake poor due to recent teeth extraction.   BMP within acceptable range on 8/24 8. CAD s/p CABG with MVR: Monitor for symptoms with activity. On coumadin and Lipitor--coreg added  9.PAF: Cont warfarin, coreg  10. R-MCA stroke with extension: Left sided weakness resolving. On coumadin.  11. Chronic systolic CHF: Encourage IS. Monitor weights daily with strict I/O. Cont meds Filed Weights   02/16/18 0627 02/17/18 0428 02/18/18 0500  Weight: 84.9 kg 83.2 kg 84.5 kg   Stable on 8/24 12. H/o BPH/Acute Urinary retention:   Cont foley at present per urology until outpatient follow up 13. Constipation: Increased mirlax to bid 14. ABLA/ Continue to monitor H/H for recovery.    Hemoglobin  10.2 on 8/24  Continue to monitor 15. Hyponatremia: Continue to monitor for now.   Sodium 136 on 8/24  Continue to monitor 16. T2DM: Monitor BS ac/hs.   Levemir daily changed to glipizide on 8/21  SSI for tighter BS control.  Repaglinide d/ced on 8/25 CBG (last 3)  Recent Labs    02/18/18 0735 02/18/18 1155 02/18/18 1643  GLUCAP 78 87 87   Hypoglycemia on 8/24  A FACE TO FACE EVALUATION WAS PERFORMED Ankit Lorie Phenix 02/18/2018, 5:54 PM

## 2018-02-18 NOTE — Progress Notes (Signed)
Physical Therapy Session Note  Patient Details  Name: Nathan PhilipsRandleman D Keasling Jr. MRN: [id number] Date of Birth: Sep 03, 1945  Today's Date: 02/18/2018 PT Individual Time: 1455-1540 PT Individual Time Calculation (min): 45 min   Short Term Goals:  Week 2:  PT Short Term Goal 1 (Week 2): =LTG due to estimated LOS      Skilled Therapeutic Interventions/Progress Updates: pt seated in recliner, with wife in attendance.  Stand pivot recliner > w/c to R with min assist.  neuromuscular re-education via forced use for w/c propulsion using bil LEs to propel, cues for steering. Kinetron in sitting in w/c for bil LE alternating reciprocal movements x 25 cycles x 2.  Gait training with RW over level tile x 30' , x 40' with min guard assist, cues for upright trunk, L step length and forward gaze.   Sit>< squat blocked practice without use of UEs to facilitate forward wt shift.  Pt needed seated rest breaks throughout session due to DOE.   Pt left resting in w/c with seat belt alarm set, and needs at hand.  Wife present. Pt informed Reshma, NT of pt's status.    Therapy Documentation Precautions:  Precautions Precautions: Fall, Sternal Restrictions Weight Bearing Restrictions: No Other Position/Activity Restrictions: sternal precautions   Vital Signs: Pulse Rate: 61 BP: 92/66 Patient Position (if appropriate): Sitting, after exercise  Pain: pt denies     See Function Navigator for Current Functional Status.   Therapy/Group: Individual Therapy  Nathan [name] 02/18/2018, 5:09 PM

## 2018-02-19 ENCOUNTER — Inpatient Hospital Stay (HOSPITAL_COMMUNITY): Payer: Self-pay | Admitting: Occupational Therapy

## 2018-02-19 LAB — GLUCOSE, CAPILLARY
GLUCOSE-CAPILLARY: 88 mg/dL (ref 70–99)
GLUCOSE-CAPILLARY: 107 mg/dL — AB (ref 70–99)
Glucose-Capillary: 163 mg/dL — ABNORMAL HIGH (ref 70–99)
Glucose-Capillary: 112 mg/dL — ABNORMAL HIGH (ref 70–99)

## 2018-02-19 LAB — PROTIME-INR
INR: 2.25
PROTHROMBIN TIME: 24.7 s — AB (ref 11.4–15.2)

## 2018-02-19 MED ORDER — WARFARIN SODIUM 3 MG PO TABS
3.0000 mg | ORAL_TABLET | Freq: Once | ORAL | Status: AC
  Administered 2018-02-19: 3 mg via ORAL
  Filled 2018-02-19: qty 1

## 2018-02-19 NOTE — Progress Notes (Addendum)
Occupational Therapy Session Note  Patient Details  Name: Nathan [name] D Abshier Jr. MRN: [id number] Date of Birth: 1945/08/22  Today's Date: 02/19/2018 OT Individual Time: [id number] OT Individual Time Calculation (min): 44 min   Skilled Therapeutic Interventions/Progress Updates:    Pt greeted in recliner with no c/o pain. Started session with toilet transfer/toileting at ambulatory level using RW with steady assist. Mod A sit<stand without UE support. Min A sit<stands with unilateral UE for power up (which pt reported was not painful during session). Pt able to complete clothing mgt x2 and required assistance for hygiene post BM. He ambulated back to recliner to rest for a bit. After worked on trunk strengthening when leaning forward to complete oral care and shaving tasks while seated in recliner. Pt then utilized figure 4 to apply lotion to feet and doff/don shoes. At end of session pt was repositioned for comfort in recliner and left with all needs within reach.   Therapy Documentation Precautions:  Precautions Precautions: Fall, Sternal Restrictions Weight Bearing Restrictions: No Other Position/Activity Restrictions: sternal precautions :   Vital Signs: Therapy Vitals Temp: 98 F (36.7 C) Temp Source: Oral Pulse Rate: 86 Resp: 17 BP: 92/69 Patient Position (if appropriate): Sitting Oxygen Therapy SpO2: 100 % O2 Device: Room Air Pain: No c/o pain during tx Pain Assessment Pain Scale: 0-10 Pain Score: 0-No pain ADL: ADL ADL Comments: Please see functional navigator          :   :    See Function Navigator for Current Functional Status.   Therapy/Group: Individual Therapy  Zhanae Proffit A Kathleen Tamm 02/19/2018, 4:09 PM

## 2018-02-19 NOTE — Progress Notes (Signed)
Subjective/Complaints: Pt seen lying in bed this AM.  He slept well overnight.  He asks how he is doing and is looking forward to rest. He notes improvement in CBGs.  ROS: Denies CP, SOB, N/V/D  Objective: Vital Signs: Blood pressure 92/69, pulse 86, temperature 98 F (36.7 C), temperature source Oral, resp. rate 17, height 5' 11.5" (1.816 m), weight 87.2 kg, SpO2 100 %. No results found. Results for orders placed or performed during the hospital encounter of 02/03/18 (from the past 72 hour(s))  Glucose, capillary     Status: Abnormal   Collection Time: 02/16/18  4:59 PM  Result Value Ref Range   Glucose-Capillary 54 (L) 70 - 99 mg/dL  Glucose, capillary     Status: None   Collection Time: 02/16/18  5:37 PM  Result Value Ref Range   Glucose-Capillary 70 70 - 99 mg/dL  Glucose, capillary     Status: None   Collection Time: 02/16/18 10:33 PM  Result Value Ref Range   Glucose-Capillary 82 70 - 99 mg/dL   Comment 1 Notify RN   Protime-INR     Status: Abnormal   Collection Time: 02/17/18  4:57 AM  Result Value Ref Range   Prothrombin Time 23.5 (H) 11.4 - 15.2 seconds   INR 2.11     Comment: Performed at [facility] [address], [place], [place] [address]  Glucose, capillary     Status: Abnormal   Collection Time: 02/17/18  7:00 AM  Result Value Ref Range   Glucose-Capillary 65 (L) 70 - 99 mg/dL  Glucose, capillary     Status: Abnormal   Collection Time: 02/17/18  7:52 AM  Result Value Ref Range   Glucose-Capillary 119 (H) 70 - 99 mg/dL  Glucose, capillary     Status: Abnormal   Collection Time: 02/17/18 11:35 AM  Result Value Ref Range   Glucose-Capillary 107 (H) 70 - 99 mg/dL  Glucose, capillary     Status: Abnormal   Collection Time: 02/17/18  4:35 PM  Result Value Ref Range   Glucose-Capillary 120 (H) 70 - 99 mg/dL  Glucose, capillary     Status: Abnormal   Collection Time: 02/17/18  9:18 PM  Result Value Ref Range   Glucose-Capillary 51 (L) 70 - 99 mg/dL    Comment 1 Notify RN    Comment 2 Document in Chart   Glucose, capillary     Status: Abnormal   Collection Time: 02/17/18 10:01 PM  Result Value Ref Range   Glucose-Capillary 68 (L) 70 - 99 mg/dL   Comment 1 Notify RN    Comment 2 Document in Chart   Glucose, capillary     Status: None   Collection Time: 02/17/18 10:26 PM  Result Value Ref Range   Glucose-Capillary 72 70 - 99 mg/dL  Protime-INR     Status: Abnormal   Collection Time: 02/18/18  4:45 AM  Result Value Ref Range   Prothrombin Time 23.7 (H) 11.4 - 15.2 seconds   INR 2.14     Comment: Performed at [facility], [place] [address], [place], [place] [address]  Basic metabolic panel     Status: Abnormal   Collection Time: 02/18/18  4:45 AM  Result Value Ref Range   Sodium 136 135 - 145 mmol/L   Potassium 4.1 3.5 - 5.1 mmol/L   Chloride 104 98 - 111 mmol/L   CO2 24 22 - 32 mmol/L   Glucose, Bld 51 (L) 70 - 99 mg/dL  BUN 13 8 - 23 mg/dL   Creatinine, Ser 0.98 0.61 - 1.24 mg/dL   Calcium 8.9 8.9 - 10.3 mg/dL   GFR calc non Af Amer >60 >60 mL/min   GFR calc Af Amer >60 >60 mL/min    Comment: (NOTE) The eGFR has been calculated using the CKD EPI equation. This calculation has not been validated in all clinical situations. eGFR's persistently <60 mL/min signify possible Chronic Kidney Disease.    Anion gap 8 5 - 15    Comment: Performed at [facility] [address], [place], [place] [address]  CBC     Status: Abnormal   Collection Time: 02/18/18  4:45 AM  Result Value Ref Range   WBC 8.4 4.0 - 10.5 K/uL   RBC 3.43 (L) 4.22 - 5.81 MIL/uL   Hemoglobin 10.2 (L) 13.0 - 17.0 g/dL   HCT 31.3 (L) 39.0 - 52.0 %   MCV 91.3 78.0 - 100.0 fL   MCH 29.7 26.0 - 34.0 pg   MCHC 32.6 30.0 - 36.0 g/dL   RDW 13.1 11.5 - 15.5 %   Platelets 242 150 - 400 K/uL    Comment: Performed at [facility], [name] [address] [place], [place] [address]  Glucose, capillary     Status: Abnormal   Collection Time: 02/18/18  6:29  AM  Result Value Ref Range   Glucose-Capillary 41 (LL) 70 - 99 mg/dL  Glucose, capillary     Status: Abnormal   Collection Time: 02/18/18  6:58 AM  Result Value Ref Range   Glucose-Capillary 60 (L) 70 - 99 mg/dL  Glucose, capillary     Status: None   Collection Time: 02/18/18  7:35 AM  Result Value Ref Range   Glucose-Capillary 78 70 - 99 mg/dL  Glucose, capillary     Status: None   Collection Time: 02/18/18 11:55 AM  Result Value Ref Range   Glucose-Capillary 87 70 - 99 mg/dL  Glucose, capillary     Status: None   Collection Time: 02/18/18  4:43 PM  Result Value Ref Range   Glucose-Capillary 87 70 - 99 mg/dL  Glucose, capillary     Status: None   Collection Time: 02/18/18  9:15 PM  Result Value Ref Range   Glucose-Capillary 83 70 - 99 mg/dL  Glucose, capillary     Status: None   Collection Time: 02/19/18  6:20 AM  Result Value Ref Range   Glucose-Capillary 88 70 - 99 mg/dL  Protime-INR     Status: Abnormal   Collection Time: 02/19/18  6:42 AM  Result Value Ref Range   Prothrombin Time 24.7 (H) 11.4 - 15.2 seconds   INR 2.25     Comment: Performed at [facility], [place] [address], [place], [place] [address]  Glucose, capillary     Status: Abnormal   Collection Time: 02/19/18 11:57 AM  Result Value Ref Range   Glucose-Capillary 163 (H) 70 - 99 mg/dL  Glucose, capillary     Status: Abnormal   Collection Time: 02/19/18  4:45 PM  Result Value Ref Range   Glucose-Capillary 112 (H) 70 - 99 mg/dL     Constitutional: No distress . Vital signs reviewed. HENT: Normocephalic.  Atraumatic. Eyes: EOMI. No discharge. Cardiovascular: RRR. No JVD. Respiratory: CTA bilaterally. Normal effort. GI: BS +. Non-distended. Musc: No edema or tenderness in extremities. Neuro: Alert and Oriented Motor:  Left upper extremity: 4+5/5 proximal distal (unchanged) Left lower extremity: 4/5 proximal distal (unchanged)  Skin:   Intact. Warm and dry.  Assessment/Plan: 1. Functional deficits  secondary to RIght MCA infarct  which require 3+ hours per day of interdisciplinary therapy in a comprehensive inpatient rehab setting. Physiatrist is providing close team supervision and 24 hour management of active medical problems listed below. Physiatrist and rehab team continue to assess barriers to discharge/monitor patient progress toward functional and medical goals. FIM: Function - Bathing Position: Shower Body parts bathed by patient: Right arm, Left arm, Chest, Abdomen, Right upper leg, Left upper leg, Right lower leg, Left lower leg, Front perineal area, Buttocks, Back Body parts bathed by helper: Buttocks, Back Bathing not applicable: Front perineal area, Buttocks, Right upper leg, Left upper leg, Right lower leg, Left lower leg(Declined LB bathing) Assist Level: Supervision or verbal cues  Function- Upper Body Dressing/Undressing What is the patient wearing?: Pull over shirt/dress Pull over shirt/dress - Perfomed by patient: Thread/unthread right sleeve, Thread/unthread left sleeve, Pull shirt over trunk, Put head through opening Pull over shirt/dress - Perfomed by helper: Put head through opening, Pull shirt over trunk Assist Level: Supervision or verbal cues Set up : To obtain clothing/put away Function - Lower Body Dressing/Undressing What is the patient wearing?: Pants, Shoes Position: Wheelchair/chair at sink Pants- Performed by patient: Thread/unthread right pants leg, Thread/unthread left pants leg, Pull pants up/down Pants- Performed by helper: Thread/unthread right pants leg, Thread/unthread left pants leg, Pull pants up/down Non-skid slipper socks- Performed by patient: Don/doff right sock, Don/doff left sock Non-skid slipper socks- Performed by helper: Don/doff right sock, Don/doff left sock Shoes - Performed by patient: Don/doff right shoe, Don/doff left shoe, Fasten right, Fasten left Shoes - Performed by helper: Fasten right, Fasten left TED Hose - Performed by  helper: Don/doff right TED hose, Don/doff left TED hose Assist for footwear: Supervision/touching assist Assist for lower body dressing: Touching or steadying assistance (Pt > 75%)  Function - Toileting Toileting activity did not occur: No continent bowel/bladder event Toileting steps completed by helper: Adjust clothing prior to toileting, Performs perineal hygiene, Adjust clothing after toileting Toileting Assistive Devices: Other (comment)(foley cath) Assist level: Touching or steadying assistance (Pt.75%)  Function - Toilet Transfers Toilet transfer activity did not occur: Refused Toilet transfer assistive device: Elevated toilet seat/BSC over toilet, Walker Assist level to toilet: Moderate assist (Pt 50 - 74%/lift or lower) Assist level from toilet: Moderate assist (Pt 50 - 74%/lift or lower) Assist level to bedside commode (at bedside): Touching or steadying assistance (Pt > 75%) Assist level from bedside commode (at bedside): Touching or steadying assistance (Pt > 75%)  Function - Chair/bed transfer Chair/bed transfer method: Stand pivot Chair/bed transfer assist level: Touching or steadying assistance (Pt > 75%) Chair/bed transfer assistive device: Walker Chair/bed transfer details: Verbal cues for sequencing, Verbal cues for technique, Verbal cues for precautions/safety, Manual facilitation for weight shifting, Manual facilitation for placement  Function - Locomotion: Wheelchair Will patient use wheelchair at discharge?: No Type: Manual Max wheelchair distance: 50 Assist Level: Supervision or verbal cues Assist Level: Supervision or verbal cues Function - Locomotion: Ambulation Ambulation activity did not occur: Safety/medical concerns Assistive device: Walker-rolling Max distance: 40 Assist level: Touching or steadying assistance (Pt > 75%) Assist level: Touching or steadying assistance (Pt > 75%) Assist level: Touching or steadying assistance (Pt > 75%)  Function -  Comprehension Comprehension: Auditory Comprehension assist level: Follows basic conversation/direction with extra time/assistive device  Function - Expression Expression: Verbal Expression assist level: Expresses complex ideas: With extra time/assistive device  Function -  Social Interaction Social Interaction assist level: Interacts appropriately 75 - 89% of the time - Needs redirection for appropriate language or to initiate interaction.  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 DVT Prophylaxis/Anticoagulation: Pharmaceutical:Coumadin per pharm protocol   INR therapeutic on 8/25 3. Pain Management:prn tylenol. Oxycodone or tramadol for more severe pain 4. Mood:LCSW to follow for evaluation and support. 5. Neuropsych: This patientis fullycapable of making decisions on hisown behalf. 6. Skin/Wound Care:Added protein supplements between meals 7. Fluids/Electrolytes/Nutrition:Strict I/O. Intake poor due to recent teeth extraction.   BMP within acceptable range on 8/24 8. CAD s/p CABG with MVR: Monitor for symptoms with activity. On coumadin and Lipitor--coreg added  9.PAF: Cont warfarin, coreg  10. R-MCA stroke with extension: Left sided weakness resolving. On coumadin.  11. Chronic systolic CHF: Encourage IS. Monitor weights daily with strict I/O. Cont meds Filed Weights   02/17/18 0428 02/18/18 0500 02/19/18 0356  Weight: 83.2 kg 84.5 kg 87.2 kg   Trending up on 8/25 12. H/o BPH/Acute Urinary  retention:   Cont foley at present per urology until outpatient follow up 13. Constipation: Increased mirlax to bid 14. ABLA/ Continue to monitor H/H for recovery.    Hemoglobin 10.2 on 8/24  Continue to monitor 15. Hyponatremia: Continue to monitor for now.   Sodium 136 on 8/24  Continue to monitor 16. T2DM: Monitor BS ac/hs.   Levemir daily changed to glipizide on 8/21  SSI for tighter BS control.  Repaglinide d/ced on 8/25 CBG (last 3)  Recent Labs    02/19/18 0620 02/19/18 1157 02/19/18 1645  GLUCAP 88 163* 112*   Labile, but improving on 8/25  A FACE TO FACE EVALUATION WAS PERFORMED Karmel Patricelli Lorie Phenix 02/19/2018, 4:52 PM

## 2018-02-19 NOTE — Progress Notes (Signed)
ANTICOAGULATION CONSULT NOTE-Follow Up Note  Pharmacy Consult for warfarin Indication: atrial fibrillation  Allergies  Allergen Reactions  . Fentanyl Shortness Of Breath  . Promethazine Hcl Anaphylaxis and Other (See Comments)    Cardiac arrest  . Foeniculum Vulgare Other (See Comments)    Fennel bulbs--nausea only  . Metformin And Related Diarrhea  . Penicillins Nausea Only    Has patient had a PCN reaction causing immediate rash, facial/tongue/throat swelling, SOB or lightheadedness with hypotension: no Has patient had a PCN reaction causing severe rash involving mucus membranes or skin necrosis: no Has patient had a PCN reaction that required hospitalization: no Has patient had a PCN reaction occurring within the last 10 years: yes If all of the above answers are "NO", then may proceed with Cephalosporin use.   . Sulfa Antibiotics Other (See Comments)    G.I. Upset    Patient Measurements: Height: 5' 11.5" (181.6 cm) Weight: 192 lb 3.9 oz (87.2 kg) IBW/kg (Calculated) : 76.45  Vital Signs: Temp: 98.8 F (37.1 C) (08/25 0617) Temp Source: Oral (08/25 0617) BP: 93/62 (08/25 0744) Pulse Rate: 81 (08/25 0744)  Labs: Recent Labs    02/17/18 0457 02/18/18 0445 02/19/18 0642  HGB  --  10.2*  --   HCT  --  31.3*  --   PLT  --  242  --   LABPROT 23.5* 23.7* 24.7*  INR 2.11 2.14 2.25  CREATININE  --  0.98  --     Estimated Creatinine Clearance: 77 mL/min (by C-G formula based on SCr of 0.98 mg/dL).   Medical History: Past Medical History:  Diagnosis Date  . Atrial fibrillation with RVR (HCC) 05/01/2017  . Cardiomyopathy (HCC) 05/01/2017  . CHF (congestive heart failure) (HCC)   . 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 Coronary artery disease   . DM (diabetes mellitus) type 2, uncontrolled, with ketoacidosis (HCC) 05/01/2017  . Essential hypertension 11/26/2016  . History of kidney stones   . HLD (hyperlipidemia) 05/01/2017  . IVCD  (intraventricular conduction defect) 04/24/2015  . Mitral valve regurgitation   . Mixed dyslipidemia 11/26/2016  . 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 Pharmacy consulted to manage warfarin dosing. Warf recently held 8/15 thru 8/17 due to INR >3.0. PO intake is variable 25-80%.   INR today remains therapeutic at 2.25. Per nursing no signs/symtoms of bleeding noted.  Goal of Therapy:  INR 2-3 Monitor platelets by anticoagulation protocol: Yes   Plan:  Give warfarin 3 mg po x 1 Monitor daily INR, CBC, clinical course, s/sx of bleed, PO intake, DDI    Thank you for allowing us to participate in this patients care.   Amanda PeaAnna Saidee Geremia, Ilda BassetPharm D PGY1 Pharmacy Resident  Please use AMION for clinical pharmacists numbers  02/19/2018      9:52 AM

## 2018-02-20 ENCOUNTER — Inpatient Hospital Stay (HOSPITAL_COMMUNITY): Payer: Self-pay | Admitting: Physical Therapy

## 2018-02-20 ENCOUNTER — Inpatient Hospital Stay (HOSPITAL_COMMUNITY): Payer: Self-pay

## 2018-02-20 ENCOUNTER — Inpatient Hospital Stay (HOSPITAL_COMMUNITY): Payer: Self-pay | Admitting: Speech Pathology

## 2018-02-20 ENCOUNTER — Inpatient Hospital Stay (HOSPITAL_COMMUNITY): Payer: Self-pay | Admitting: Occupational Therapy

## 2018-02-20 LAB — GLUCOSE, CAPILLARY
GLUCOSE-CAPILLARY: 107 mg/dL — AB (ref 70–99)
Glucose-Capillary: 100 mg/dL — ABNORMAL HIGH (ref 70–99)
Glucose-Capillary: 164 mg/dL — ABNORMAL HIGH (ref 70–99)
Glucose-Capillary: 57 mg/dL — ABNORMAL LOW (ref 70–99)
Glucose-Capillary: 83 mg/dL (ref 70–99)

## 2018-02-20 LAB — PROTIME-INR
INR: 2.1
Prothrombin Time: 23.4 seconds — ABNORMAL HIGH (ref 11.4–15.2)

## 2018-02-20 MED ORDER — WARFARIN SODIUM 3 MG PO TABS
3.0000 mg | ORAL_TABLET | Freq: Once | ORAL | Status: AC
  Administered 2018-02-20: 3 mg via ORAL
  Filled 2018-02-20: qty 1

## 2018-02-20 NOTE — Progress Notes (Signed)
ANTICOAGULATION CONSULT NOTE-Follow Up Note  Pharmacy Consult for warfarin Indication: atrial fibrillation  Allergies  Allergen Reactions  . Fentanyl Shortness Of Breath  . Promethazine Hcl Anaphylaxis and Other (See Comments)    Cardiac arrest  . Foeniculum Vulgare Other (See Comments)    Fennel bulbs--nausea only  . Metformin And Related Diarrhea  . Penicillins Nausea Only    Has patient had a PCN reaction causing immediate rash, facial/tongue/throat swelling, SOB or lightheadedness with hypotension: no Has patient had a PCN reaction causing severe rash involving mucus membranes or skin necrosis: no Has patient had a PCN reaction that required hospitalization: no Has patient had a PCN reaction occurring within the last 10 years: yes If all of the above answers are "NO", then may proceed with Cephalosporin use.   . Sulfa Antibiotics Other (See Comments)    G.I. Upset    Patient Measurements: Height: 5' 11.5" (181.6 cm) Weight: 186 lb 11.7 oz (84.7 kg) IBW/kg (Calculated) : 76.45  Vital Signs: Temp: 97.9 F (36.6 C) (08/26 0358) Temp Source: Oral (08/26 0358) BP: 93/62 (08/26 0845) Pulse Rate: 86 (08/26 0845)  Labs: Recent Labs    02/18/18 0445 02/19/18 0642 02/20/18 0515  HGB 10.2*  --   --   HCT 31.3*  --   --   PLT 242  --   --   LABPROT 23.7* 24.7* 23.4*  INR 2.14 2.25 2.10  CREATININE 0.98  --   --     Estimated Creatinine Clearance: 77 mL/min (by C-G formula based on SCr of 0.98 mg/dL).   Medical History: Past Medical History:  Diagnosis Date  . Atrial fibrillation with RVR (HCC) 05/01/2017  . Cardiomyopathy (HCC) 05/01/2017  . CHF (congestive heart failure) (HCC)   . 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 Coronary artery disease   . DM (diabetes mellitus) type 2, uncontrolled, with ketoacidosis (HCC) 05/01/2017  . Essential hypertension 11/26/2016  . History of kidney stones   . HLD (hyperlipidemia) 05/01/2017  .  IVCD (intraventricular conduction defect) 04/24/2015  . Mitral valve regurgitation   . Mixed dyslipidemia 11/26/2016  . 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 Pharmacy consulted to manage warfarin dosing. Warf recently held 8/15 thru 8/17 due to INR >3.0.   INR today remains therapeutic at 2.10 Per nursing no signs/symtoms of bleeding noted.  Goal of Therapy:  INR 2-3 Monitor platelets by anticoagulation protocol: Yes   Plan:  Give warfarin 3 mg po x 1 Monitor daily INR, CBC, clinical course, s/sx of bleed, PO intake, DDI    Thank you for allowing us to participate in this patient's care.  Robyne AskewKaren Gilmore List, RPh Clinical Pharmacist Please use AMION for clinical pharmacists numbers  02/20/2018      10:26 AM

## 2018-02-20 NOTE — [place] FL2 (Signed)
[place] MEDICAID FL2 LEVEL OF CARE SCREENING TOOL     IDENTIFICATION  Patient Name: Nathan [name]. Birthdate: [birth year] Sex: male Admission Date (Current Location): 02/03/2018  [facility] and [place] Number:  [employer] and Address:  [facility], [address], [place], [place] [address]      Provider Number: [id number]  Attending Physician Name and Address:  Marcello Fennel, MD  Relative Name and Phone Number:  keaten mashek [phone]    Current Level of Care: Other (Comment)(Rehab) Recommended Level of Care: Skilled Nursing Facility Prior Approval Number:    Date Approved/Denied:   PASRR Number: [id number]  Discharge Plan: SNF    Current Diagnoses: Patient Active Problem List   Diagnosis Date Noted  . Hypoglycemia   . History of CVA with residual deficit   . Labile blood glucose   . Benign prostatic hyperplasia with urinary retention   . History of CVA (cerebrovascular accident) without residual deficits   . PAF (paroxysmal atrial fibrillation) (HCC)   . Anemia of chronic disease   . Debility 02/03/2018  . 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 Pressure injury of skin 02/02/2018  . Malnutrition of moderate degree 02/01/2018  . Coronary artery disease involving native coronary artery of native heart with angina pectoris (HCC)   . S/P CABG (coronary artery bypass graft)   . Atrial fibrillation (HCC)   . Chronic combined systolic and diastolic CHF (congestive heart failure) (HCC)   . Diabetes mellitus type 2 in nonobese (HCC)   . Essential hypertension   . History of CVA (cerebrovascular accident)   . Acute blood loss anemia   . Hyponatremia   . S/P left atrial appendage ligation 01/26/2018  . S/P CABG x 4 01/23/2018  . S/P MVR (mitral valve replacement) 01/23/2018  . Mitral valve insufficiency   . On amiodarone therapy 07/28/2017  . CAD in native artery  07/28/2017  . Chronic anticoagulation 05/18/2017  . LBBB (left bundle branch block) 05/18/2017  . Hospital discharge follow-up 05/09/2017  . Paroxysmal atrial fibrillation (HCC) 05/01/2017  . Dilated cardiomyopathy (HCC) 05/01/2017  . DM (diabetes mellitus) type 2, uncontrolled, with ketoacidosis (HCC) 05/01/2017  . Hypotension 05/01/2017  . HLD (hyperlipidemia) 05/01/2017  . Cerebrovascular accident (CVA) due to embolism of right middle cerebral artery (HCC) 04/29/2017  . Hypertensive heart disease with heart failure (HCC) 11/26/2016  . Mixed dyslipidemia 11/26/2016  . Chronic systolic congestive heart failure (HCC) 04/24/2015  . IVCD (intraventricular conduction defect) 04/24/2015    Orientation RESPIRATION BLADDER Height & Weight     Self, Time, Situation, Place  Normal Indwelling catheter(Follow up Urology for voiding trial) Weight: 186 lb 11.7 oz (84.7 kg) Height:  5' 11.5" (181.6 cm)  BEHAVIORAL SYMPTOMS/MOOD NEUROLOGICAL BOWEL NUTRITION STATUS      Continent Diet(Heart healthy)  AMBULATORY STATUS COMMUNICATION OF NEEDS Skin   Limited Assist Verbally Surgical wounds                       Personal Care Assistance Level of Assistance  Bathing, Dressing Bathing Assistance: Limited assistance   Dressing Assistance: Limited assistance     Functional Limitations Info             SPECIAL CARE FACTORS FREQUENCY  PT (By licensed PT), OT (By licensed OT), Speech therapy     PT Frequency: 5x week OT Frequency: 5x week     Speech Therapy  Frequency: 5 x week      Contractures Contractures Info: Not present    Additional Factors Info  Code Status, Allergies Code Status Info: Full Allergies Info: Fentanyl, Promethaine, HCL, Metformin and related Penicillins, Sulfa Antibiotics           Current Medications (02/20/2018):  This is the current hospital active medication list Current Facility-Administered Medications  Medication Dose Route Frequency Provider Last  Rate Last Dose  . acetaminophen (TYLENOL) tablet 325-650 mg  325-650 mg Oral Q4H PRN Jacquelynn Cree, PA-C   650 mg at 02/19/18 2205  . alum & mag hydroxide-simeth (MAALOX/MYLANTA) 200-200-20 MG/5ML suspension 30 mL  30 mL Oral Q4H PRN Love, Pamela S, PA-C      . aspirin EC tablet 81 mg  81 mg Oral Daily Jacquelynn Cree, PA-C   81 mg at 02/20/18 0846  . bisacodyl (DULCOLAX) EC tablet 10 mg  10 mg Oral Daily Jacquelynn Cree, PA-C   10 mg at 02/20/18 0846  . carvedilol (COREG) tablet 3.125 mg  3.125 mg Oral BID WC Marcello Fennel, MD   3.125 mg at 02/20/18 0846  . diphenhydrAMINE (BENADRYL) 12.5 MG/5ML elixir 12.5-25 mg  12.5-25 mg Oral Q6H PRN Love, Pamela S, PA-C      . feeding supplement (PRO-STAT SUGAR FREE 64) liquid 30 mL  30 mL Oral BID Jacquelynn Cree, PA-C   30 mL at 02/16/18 0904  . glipiZIDE (GLUCOTROL XL) 24 hr tablet 10 mg  10 mg Oral Q breakfast Jacquelynn Cree, PA-C   10 mg at 02/20/18 0846  . guaiFENesin (MUCINEX) 12 hr tablet 1,200 mg  1,200 mg Oral BID Jacquelynn Cree, PA-C   1,200 mg at 02/20/18 0846  . guaiFENesin-dextromethorphan (ROBITUSSIN DM) 100-10 MG/5ML syrup 5-10 mL  5-10 mL Oral Q6H PRN Love, Pamela S, PA-C      . insulin aspart (novoLOG) injection 0-24 Units  0-24 Units Subcutaneous TID WC Jacquelynn Cree, PA-C   4 Units at 02/19/18 1234  . lidocaine (XYLOCAINE) 2 % jelly 1 application  1 application Urethral Once Angiulli, Mcarthur Rossetti, PA-C      . losartan (COZAAR) tablet 12.5 mg  12.5 mg Oral QHS Love, Pamela S, PA-C   12.5 mg at 02/19/18 2206  . ondansetron (ZOFRAN) tablet 4 mg  4 mg Oral Q6H PRN Jacquelynn Cree, PA-C   4 mg at 02/12/18 1191   Or  . ondansetron (ZOFRAN) injection 4 mg  4 mg Intravenous Q6H PRN Love, Pamela S, PA-C      . oxyCODONE (Oxy IR/ROXICODONE) immediate release tablet 5 mg  5 mg Oral Q6H PRN Jacquelynn Cree, PA-C   5 mg at 02/19/18 2206  . pantoprazole (PROTONIX) EC tablet 40 mg  40 mg Oral Daily Jacquelynn Cree, PA-C   40 mg at 02/20/18 0846  .  polyethylene glycol (MIRALAX / GLYCOLAX) packet 17 g  17 g Oral BID Jacquelynn Cree, PA-C   17 g at 02/20/18 0844  . protein supplement (PREMIER PROTEIN) liquid - approved for s/p bariatric surgery  11 oz Oral TID WC Love, Evlyn Kanner, PA-C   11 oz at 02/18/18 1710  . rosuvastatin (CRESTOR) tablet 20 mg  20 mg Oral q1800 Jacquelynn Cree, PA-C   20 mg at 02/19/18 1712  . spironolactone (ALDACTONE) tablet 12.5 mg  12.5 mg Oral Daily Jacquelynn Cree, PA-C   12.5 mg at 02/20/18 0846  . tamsulosin (FLOMAX) capsule 0.4  mg  0.4 mg Oral QHS Jacquelynn CreeLove, Pamela S, PA-C   0.4 mg at 02/19/18 2206  . traZODone (DESYREL) tablet 25-50 mg  25-50 mg Oral QHS PRN Jacquelynn CreeLove, Pamela S, PA-C   50 mg at 02/19/18 2305  . Warfarin - Pharmacist Dosing Inpatient   Does not apply q1800 Sampson SiMancheril, Benjamin G, [facility]         Discharge Medications: Please see discharge summary for a list of discharge medications.  Relevant Imaging Results:  Relevant Lab Results:   Additional Information SSN: [id number]  [place] Leap, Lemar LivingsRebecca G, LCSW

## 2018-02-20 NOTE — Progress Notes (Signed)
Subjective/Complaints: Patient seen lying in bed this morning. He states he slept well overnight. He is in good spirits. He wants to go home.  ROS: denies CP, SOB, N/V/D  Objective: Vital Signs: Blood pressure 107/72, pulse 75, temperature 97.9 F (36.6 C), temperature source Oral, resp. rate 16, height 5' 11.5" (1.816 m), weight 84.7 kg, SpO2 99 %. No results found. Results for orders placed or performed during the hospital encounter of 02/03/18 (from the past 72 hour(s))  Glucose, capillary     Status: Abnormal   Collection Time: 02/17/18  7:52 AM  Result Value Ref Range   Glucose-Capillary 119 (H) 70 - 99 mg/dL  Glucose, capillary     Status: Abnormal   Collection Time: 02/17/18 11:35 AM  Result Value Ref Range   Glucose-Capillary 107 (H) 70 - 99 mg/dL  Glucose, capillary     Status: Abnormal   Collection Time: 02/17/18  4:35 PM  Result Value Ref Range   Glucose-Capillary 120 (H) 70 - 99 mg/dL  Glucose, capillary     Status: Abnormal   Collection Time: 02/17/18  9:18 PM  Result Value Ref Range   Glucose-Capillary 51 (L) 70 - 99 mg/dL   Comment 1 Notify RN    Comment 2 Document in Chart   Glucose, capillary     Status: Abnormal   Collection Time: 02/17/18 10:01 PM  Result Value Ref Range   Glucose-Capillary 68 (L) 70 - 99 mg/dL   Comment 1 Notify RN    Comment 2 Document in Chart   Glucose, capillary     Status: None   Collection Time: 02/17/18 10:26 PM  Result Value Ref Range   Glucose-Capillary 72 70 - 99 mg/dL  Protime-INR     Status: Abnormal   Collection Time: 02/18/18  4:45 AM  Result Value Ref Range   Prothrombin Time 23.7 (H) 11.4 - 15.2 seconds   INR 2.14     Comment: Performed at [facility], [address] [place], [place] [address]  Basic metabolic panel     Status: Abnormal   Collection Time: 02/18/18  4:45 AM  Result Value Ref Range   Sodium 136 135 - 145 mmol/L   Potassium 4.1 3.5 - 5.1 mmol/L   Chloride 104 98 - 111 mmol/L   CO2 24 22 -  32 mmol/L   Glucose, Bld 51 (L) 70 - 99 mg/dL   BUN 13 8 - 23 mg/dL   Creatinine, Ser 0.98 0.61 - 1.24 mg/dL   Calcium 8.9 8.9 - 10.3 mg/dL   GFR calc non Af Amer >60 >60 mL/min   GFR calc Af Amer >60 >60 mL/min    Comment: (NOTE) The eGFR has been calculated using the CKD EPI equation. This calculation has not been validated in all clinical situations. eGFR's persistently <60 mL/min signify possible Chronic Kidney Disease.    Anion gap 8 5 - 15    Comment: Performed at [facility] [address], [place], [place] [address]  CBC     Status: Abnormal   Collection Time: 02/18/18  4:45 AM  Result Value Ref Range   WBC 8.4 4.0 - 10.5 K/uL   RBC 3.43 (L) 4.22 - 5.81 MIL/uL   Hemoglobin 10.2 (L) 13.0 - 17.0 g/dL   HCT 31.3 (L) 39.0 - 52.0 %   MCV 91.3 78.0 - 100.0 fL   MCH 29.7 26.0 - 34.0 pg   MCHC 32.6 30.0 - 36.0 g/dL   RDW 13.1  11.5 - 15.5 %   Platelets 242 150 - 400 K/uL    Comment: Performed at [facility], [place] [address], [place], [place] [address]  Glucose, capillary     Status: Abnormal   Collection Time: 02/18/18  6:29 AM  Result Value Ref Range   Glucose-Capillary 41 (LL) 70 - 99 mg/dL  Glucose, capillary     Status: Abnormal   Collection Time: 02/18/18  6:58 AM  Result Value Ref Range   Glucose-Capillary 60 (L) 70 - 99 mg/dL  Glucose, capillary     Status: None   Collection Time: 02/18/18  7:35 AM  Result Value Ref Range   Glucose-Capillary 78 70 - 99 mg/dL  Glucose, capillary     Status: None   Collection Time: 02/18/18 11:55 AM  Result Value Ref Range   Glucose-Capillary 87 70 - 99 mg/dL  Glucose, capillary     Status: None   Collection Time: 02/18/18  4:43 PM  Result Value Ref Range   Glucose-Capillary 87 70 - 99 mg/dL  Glucose, capillary     Status: None   Collection Time: 02/18/18  9:15 PM  Result Value Ref Range   Glucose-Capillary 83 70 - 99 mg/dL  Glucose, capillary     Status: None   Collection Time: 02/19/18  6:20 AM  Result Value  Ref Range   Glucose-Capillary 88 70 - 99 mg/dL  Protime-INR     Status: Abnormal   Collection Time: 02/19/18  6:42 AM  Result Value Ref Range   Prothrombin Time 24.7 (H) 11.4 - 15.2 seconds   INR 2.25     Comment: Performed at [facility], [place] [address] [place], [place] [address]  Glucose, capillary     Status: Abnormal   Collection Time: 02/19/18 11:57 AM  Result Value Ref Range   Glucose-Capillary 163 (H) 70 - 99 mg/dL  Glucose, capillary     Status: Abnormal   Collection Time: 02/19/18  4:45 PM  Result Value Ref Range   Glucose-Capillary 112 (H) 70 - 99 mg/dL  Glucose, capillary     Status: Abnormal   Collection Time: 02/19/18 10:19 PM  Result Value Ref Range   Glucose-Capillary 107 (H) 70 - 99 mg/dL  Glucose, capillary     Status: Abnormal   Collection Time: 02/20/18  6:33 AM  Result Value Ref Range   Glucose-Capillary 100 (H) 70 - 99 mg/dL     Constitutional: No distress . Vital signs reviewed. HENT: Normocephalic.  Atraumatic. Eyes: EOMI. No discharge. Cardiovascular: RRR. No JVD. Respiratory: CTA bilaterally. Normal effort. GI: BS +. Non-distended. Musc: No edema or tenderness in extremities. Neuro: Alert and Oriented Motor:  Left upper extremity: 4+5/5 proximal distal (stable) Left lower extremity: 4/5 proximal distal (stable) Skin:   Intact. Warm and dry.  Assessment/Plan: 1. Functional deficits secondary to RIght MCA infarct  which require 3+ hours per day of interdisciplinary therapy in a comprehensive inpatient rehab setting. Physiatrist is providing close team supervision and 24 hour management of active medical problems listed below. Physiatrist and rehab team continue to assess barriers to discharge/monitor patient progress toward functional and medical goals. FIM: Function - Bathing Position: Shower Body parts bathed by patient: Right arm, Left arm, Chest, Abdomen, Right upper leg, Left upper leg, Right lower leg, Left lower leg, Front perineal  area, Buttocks, Back Body parts bathed by helper: Buttocks, Back Bathing not applicable: Front perineal area, Buttocks, Right upper leg, Left upper leg, Right lower leg, Left  lower leg(Declined LB bathing) Assist Level: Supervision or verbal cues  Function- Upper Body Dressing/Undressing What is the patient wearing?: Pull over shirt/dress Pull over shirt/dress - Perfomed by patient: Thread/unthread right sleeve, Thread/unthread left sleeve, Pull shirt over trunk, Put head through opening Pull over shirt/dress - Perfomed by helper: Put head through opening, Pull shirt over trunk Assist Level: Supervision or verbal cues Set up : To obtain clothing/put away Function - Lower Body Dressing/Undressing What is the patient wearing?: Pants, Shoes Position: Wheelchair/chair at sink Pants- Performed by patient: Thread/unthread right pants leg, Thread/unthread left pants leg, Pull pants up/down Pants- Performed by helper: Thread/unthread right pants leg, Thread/unthread left pants leg, Pull pants up/down Non-skid slipper socks- Performed by patient: Don/doff right sock, Don/doff left sock Non-skid slipper socks- Performed by helper: Don/doff right sock, Don/doff left sock Shoes - Performed by patient: Don/doff right shoe, Don/doff left shoe, Fasten right, Fasten left Shoes - Performed by helper: Fasten right, Fasten left TED Hose - Performed by helper: Don/doff right TED hose, Don/doff left TED hose Assist for footwear: Supervision/touching assist Assist for lower body dressing: Touching or steadying assistance (Pt > 75%)  Function - Toileting Toileting activity did not occur: No continent bowel/bladder event Toileting steps completed by helper: Adjust clothing prior to toileting, Performs perineal hygiene, Adjust clothing after toileting Toileting Assistive Devices: Other (comment)(foley cath) Assist level: Touching or steadying assistance (Pt.75%)  Function - Toilet Transfers Toilet transfer  activity did not occur: Refused Toilet transfer assistive device: Elevated toilet seat/BSC over toilet, Walker Assist level to toilet: Moderate assist (Pt 50 - 74%/lift or lower) Assist level from toilet: Moderate assist (Pt 50 - 74%/lift or lower) Assist level to bedside commode (at bedside): Touching or steadying assistance (Pt > 75%) Assist level from bedside commode (at bedside): Touching or steadying assistance (Pt > 75%)  Function - Chair/bed transfer Chair/bed transfer method: Stand pivot Chair/bed transfer assist level: Touching or steadying assistance (Pt > 75%) Chair/bed transfer assistive device: Walker Chair/bed transfer details: Verbal cues for sequencing, Verbal cues for technique, Verbal cues for precautions/safety, Manual facilitation for weight shifting, Manual facilitation for placement  Function - Locomotion: Wheelchair Will patient use wheelchair at discharge?: No Type: Manual Max wheelchair distance: 50 Assist Level: Supervision or verbal cues Assist Level: Supervision or verbal cues Function - Locomotion: Ambulation Ambulation activity did not occur: Safety/medical concerns Assistive device: Walker-rolling Max distance: 40 Assist level: Touching or steadying assistance (Pt > 75%) Assist level: Touching or steadying assistance (Pt > 75%) Assist level: Touching or steadying assistance (Pt > 75%)  Function - Comprehension Comprehension: Auditory Comprehension assist level: Follows basic conversation/direction with extra time/assistive device  Function - Expression Expression: Verbal Expression assist level: Expresses complex ideas: With extra time/assistive device  Function - Social Interaction Social Interaction assist level: Interacts appropriately 75 - 89% of the time - Needs redirection for appropriate language or to initiate interaction.  Function - Problem Solving Problem solving assist level: Solves basic problems with no assist  Function -  Memory Memory assist level: Recognizes or recalls 50 - 74% of the time/requires cueing 25 - 49% of the time Patient normally able to recall (first 3 days only): Current season, Location of own room, Staff names and faces, That he or she is in a hospital    Medical Problem List and Plan: 1.Debility and LEFT  hemiparesissecondary to Right MCA (chronic) and deconditioning after CABG/MVR Cont CIR 2. DVT Prophylaxis/Anticoagulation: Pharmaceutical:Coumadin per pharm protocol   INR therapeutic on 8/26  3. Pain Management:prn tylenol. Oxycodone or tramadol for more severe pain 4. Mood:LCSW to follow for evaluation and support. 5. Neuropsych: This patientis fullycapable of making decisions on hisown behalf. 6. Skin/Wound Care:Added protein supplements between meals 7. Fluids/Electrolytes/Nutrition:Strict I/O. Intake poor due to recent teeth extraction.   BMP within acceptable range on 8/24 8. CAD s/p CABG with MVR: Monitor for symptoms with activity. On coumadin and Lipitor--coreg added  9.PAF: Cont warfarin, coreg  10. R-MCA stroke with extension: Left sided weakness resolving. On coumadin.  11. Chronic systolic CHF: Encourage IS. Monitor weights daily with strict I/O. Cont meds Filed Weights   02/18/18 0500 02/19/18 0356 02/20/18 0444  Weight: 84.5 kg 87.2 kg 84.7 kg   Stable on 8/26 12. H/o BPH/Acute Urinary retention:   Cont foley at present per urology until outpatient follow up 13. Constipation: Increased mirlax to bid 14. ABLA/ Continue to monitor H/H for recovery.    Hemoglobin 10.2 on 8/24  Continue to monitor 15. Hyponatremia: Continue to monitor for now.   Sodium 136 on 8/24  Continue to monitor 16. T2DM: Monitor BS ac/hs.   Levemir daily changed to glipizide on 8/21  SSI for tighter BS control.  Repaglinide d/ced on 8/25 CBG (last 3)  Recent Labs    02/19/18 1645 02/19/18 2219 02/20/18 0633  GLUCAP 112* 107* 100*   Labile, but overall  improved on 8/26   A FACE TO FACE EVALUATION WAS PERFORMED Kehinde Totzke Lorie Phenix 02/20/2018, 7:01 AM

## 2018-02-20 NOTE — Discharge Summary (Addendum)
Physician Discharge Summary  Patient ID: Nathan [name]. MRN: [id number] DOB/AGE: [birth year] 70 y.o.  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  Mental status changes. EXAM: CT HEAD WITHOUT CONTRAST TECHNIQUE: Contiguous axial images were obtained from the base of the skull through the vertex without intravenous contrast. COMPARISON:  05/03/2017 FINDINGS: Brain: Diffuse loss of parenchymal volume with chronic small-vessel deep white matter disease again noted. Evolution of right MCA territory infarct. There is new loss of gray-white differentiation posterior right frontal lobe highly suspicious for acute to subacute ischemia. Vascular: No hyperdense vessel or unexpected calcification. Skull: No evidence for fracture. No worrisome lytic or sclerotic lesion. Sinuses/Orbits: The visualized paranasal sinuses and mastoid air cells are clear. Visualized portions of the globes and intraorbital fat are unremarkable. Other: None. IMPRESSION: Interval development of hypo attenuation with loss of gray-white differentiation in the posterior right frontal region, potentially related to evolution of previously noted right MCA infarct, but acute to subacute ischemia in this region a concern. MRI of the brain could be used to further evaluate. No evidence for  associated acute hemorrhage. Electronically Signed   By: Kennith Center M.D.   On: 01/27/2018 20:03   Ct Abdomen Pelvis W Contrast  Result Date: 02/07/2018 CLINICAL DATA:  Gross hematuria. Patient is postoperative coronary bypass on 07/29. A Foley catheter was placed for urinary retention. Positive urine cultures and on antibiotics. EXAM: CT ABDOMEN AND PELVIS WITH CONTRAST TECHNIQUE: Multidetector CT imaging of the abdomen and pelvis was performed using the standard protocol following bolus administration of intravenous contrast. CONTRAST:  OMNIPAQUE IOHEXOL 300 MG/ML  SOLN COMPARISON:  CT abdomen and pelvis 10/15/2016 FINDINGS: Lower chest: Small bilateral pleural effusions. Infiltrates in the lung bases could be due to edema, atelectasis, or pneumonia. Cardiac enlargement. Postoperative changes in the mediastinum. Hepatobiliary: No focal liver abnormality is seen. No gallstones, gallbladder wall thickening, or biliary dilatation. Pancreas: Unremarkable. No pancreatic ductal dilatation or surrounding inflammatory changes. Spleen: Normal in size without focal abnormality. Adrenals/Urinary Tract: No adrenal gland nodules. Renal nephrograms are symmetrical. Small bilateral renal cysts. No hydronephrosis or hydroureter. Bladder is decompressed with a Foley catheter in place. Bladder wall is thickened suggesting under distention versus cystitis changes. Air in the bladder likely results from catheterization. No focal lesion identified. Decompression of the bladder limits evaluation. Stomach/Bowel: Stomach, small bowel, and colon are not abnormally distended. Contrast material flows through to the rectum suggesting no evidence of obstruction. No wall thickening or inflammatory changes are appreciated. The appendix is normal. Vascular/Lymphatic: Aortic atherosclerosis. No enlarged abdominal or pelvic lymph nodes. Reproductive: Prostate gland is diffusely enlarged, measuring 6.2 cm in diameter. Other: No free air  or free fluid in the abdomen. Abdominal wall musculature appears intact. Musculoskeletal: No acute or significant osseous findings. IMPRESSION: 1. Small bilateral pleural effusions with basilar atelectasis, edema, or pneumonia. 2.  Bladder wall thickening suggesting under distention versus cystitis. Bladder is decompressed with Foley catheter in place. 3. Prostate gland is diffusely enlarged Electronically Signed   By: Burman Nieves M.D.   On: 02/07/2018 04:44   Dg Chest Port 1 View  Result Date: 01/31/2018 CLINICAL DATA:  Shortness of breath for 2 days EXAM: PORTABLE CHEST 1 VIEW COMPARISON:  Portable exam 0823 hours compared a 09/2017 FINDINGS: RIGHT arm PICC line tip projects over SVC. Enlargement of cardiac silhouette post CABG, MVR, and atrial appendage clipping. Stable mediastinal contours. Decreased pulmonary vascular congestion. Atherosclerotic calcification aorta. Improved pulmonary edema, remaining asymmetrically greater on LEFT. Tiny LEFT pleural effusion. No pneumothorax. IMPRESSION: Improved pulmonary edema. Electronically Signed   By: Ulyses Southward M.D.   On: 01/31/2018 08:35     Labs:  Basic Metabolic Panel: BMP Latest Ref Rng & Units 02/21/2018 02/18/2018 02/15/2018  Glucose 70 - 99 mg/dL 88 16(X) 096(E)  BUN 8 - 23 mg/dL 14 13 14   Creatinine 0.61 - 1.24 mg/dL 4.54 0.98 1.19  BUN/Creat Ratio 10 - 24 - - -  Sodium 135 - 145 mmol/L 135 136 136  Potassium 3.5 - 5.1 mmol/L 4.4 4.1 4.6  Chloride 98 - 111 mmol/L 103 104 102  CO2 22 - 32 mmol/L 24 24 26   Calcium 8.9 - 10.3 mg/dL 8.9 8.9 1.4(N)    CBC: Recent Labs  Lab 02/18/18 0445 02/21/18 0638 02/24/18 0444  WBC 8.4 6.4 7.3  NEUTROABS  --   --  4.7  HGB 10.2* 10.4* 10.5*  HCT 31.3* 31.4* 32.3*  MCV 91.3 90.8 91.8  PLT 242 263 249    CBG: Recent Labs  Lab 02/22/18 2104 02/23/18 0625 02/23/18 1152 02/23/18 1643 02/24/18 0656  GLUCAP 84 82 176* 144* 111*    Lab Results  Component Value Date   INR 2.20 02/24/2018    INR 2.30 02/23/2018   INR 2.18 02/22/2018   Brief HPI:   Giovanni D Thad Ranger Junior is a 72 year old left-handed male with history of PAF, T2DM, chronic systolic CHF, 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 Maren Beach.  Postop course significant for a BLE, thrombocytopenia, fluid overload, hypertension, thrombocytopenia as well as mental status changes with recurrent left-sided weakness and 08/02.  CT head done revealing interval development of hypoattenuation in the right frontal region felt to be due to extension of right MCA stroke.  He has had bouts of intermittent A. fib and was maintained on IV heparin and transition to Coumadin.  He has had issues with bouts of confusion and was found to have urinary retention requiring in and out caths for with volumes up to 700 to 1100 cc therefore Foley was placed to help decompress bladder.  He continues to have issues with poor p.o. intake as well as constipation.  Patient was noted to be debilitated and CIR was recommended for follow-up therapy.    Hospital Course: Hudsen D Khy Pitre. was admitted to rehab 02/03/2018 for inpatient therapies to consist of PT, ST and OT at least three hours five days a week. Past admission physiatrist, therapy team and rehab RN have worked together to provide customized collaborative inpatient rehab.  Sternal incision is healing well without signs or symptoms of infection. He was started on bowel program to help manage constipation and is continent of bowel. His  PO intake has improved.   Diabetes has been monitored with  AC/HS CBG checks and blood sugars have been well controlled.  Levemir was DC'd attempts made to resume home oral hypoglycemic regimen but he has had hypoglycemic episodes therefore Glucotrol and Prandin was discontinued. SSI was used for management of BS and they are now trending back  up.   He was found to have leukocytosis noted at admission due to Citrobacter koseri UTI.  He was treated with 7 day antibiotic course.  He developed gross hematuria on 8/12  and  CT abdomen/pelvis ordered per Dr Dudley MajorWoods/GU input. This showed bladder wall thickening likely due to cystitis and noted made of diffusely enlarged prostate and urology recommended keeping Foley in place for 2 weeks. He has been set up to follow up with his primary urologist after discharge for voiding trial. His urine has been clear with intermittent microhematuria.  Serial CBC shows H&H is stable. He did report dysuria on 8/28 and repeat urine culture showed Citrobacter koseri UTI >100,000 colonies. Cipro was initiated 8/30 and foley changed out prior to discharge.    Renal status is stable.  Respiratory status is stable and no signs of overload noted.  Weight is down to 184 pounds.  Blood pressures are stable and INR has been monitored closely by pharmacy and has been stable on 3 mg daily. INR is 2.20 at discharge and he is tolerating coumadin without side effects. He has been making steady progress but continues to requires assistance with mobility for safety due to sternal precautions as well as cognitive deficits. He requires supervision to [facility] and wife has elected on SNF for follow up therapy. He was discharged to [facility] on 02/24/18.    Rehab course: During patient's stay in rehab weekly team conferences were held to monitor patient's progress, set goals and discuss barriers to discharge. At admission, patient required max assist with ADLs and max assist with mobility. He demonstrated moderate cognitive impairments affecting attention problem-solving awareness and recall. He  has had improvement in activity tolerance, balance, postural control as well as ability to compensate for deficits.  He requires supervision with verbal cues to maintain sternal precautions as well as cues for sequencing to complete ADL tasks. He  requires CGA for transfers and to ambulate 2775' with RW. Cognition has improved with MoCA score at 26/30 and close to his baseline. He requires supervision with all tasks due to mild memory deficits and is showing improvement in use of memory strategies and for carryover.    Disposition: Skilled Nursing facility.   Diet: Heart Healthy/Carb Modified medium  Special Instructions: 1. Urology follow up9/4/19  for voiding trial. Will need cystoscopy in the future for work up of hematuria. 2. Monitor BS ac/hs--use SSI per protocol.    3. Continue sternal precautions till cleared by Dr. Donata ClayVan Trigt.  4. Monitor INR--next on 9/2 as patient now on cipro. INR goal 2-3 range.   Discharge Instructions    Ambulatory referral to Physical Medicine Rehab   Complete by:  As directed    4-6 weeks follow up appointment     Allergies as of 02/24/2018      Reactions   Fentanyl Shortness Of Breath   Promethazine Hcl Anaphylaxis, Other (See Comments)   Cardiac arrest   Foeniculum Vulgare Other (See Comments)   Fennel bulbs--nausea only   Metformin And Related Diarrhea   Penicillins Nausea Only   Has patient had a PCN reaction causing immediate rash, facial/tongue/throat swelling, SOB or lightheadedness with hypotension: no Has patient had a PCN reaction causing  severe rash involving mucus membranes or skin necrosis: no Has patient had a PCN reaction that required hospitalization: no Has patient had a PCN reaction occurring within the last 10 years: yes If all of the above answers are "NO", then may proceed with Cephalosporin use.   Sulfa Antibiotics Other (See Comments)   G.I. Upset      Medication List    STOP taking these medications   cephALEXin 500 MG capsule Commonly known as:  KEFLEX   feeding supplement (GLUCERNA SHAKE) Liqd   feeding supplement (PRO-STAT SUGAR FREE 64) Liqd   glipiZIDE 10 MG 24 hr tablet Commonly known as:  GLUCOTROL XL   oxyCODONE 5 MG immediate release  tablet Commonly known as:  Oxy IR/ROXICODONE   repaglinide 0.5 MG tablet Commonly known as:  PRANDIN   simvastatin 40 MG tablet Commonly known as:  ZOCOR     TAKE these medications   acetaminophen 325 MG tablet Commonly known as:  TYLENOL Take 650 mg by mouth every 6 (six) hours as needed (for pain.).   aspirin EC 81 MG tablet Take 81 mg by mouth daily.   bisacodyl 5 MG EC tablet Commonly known as:  DULCOLAX Take 2 tablets (10 mg total) by mouth daily.   carvedilol 3.125 MG tablet Commonly known as:  COREG Take 1 tablet (3.125 mg total) by mouth 2 (two) times daily with a meal.   ciprofloxacin 250 MG tablet Commonly known as:  CIPRO Take 1 tablet (250 mg total) by mouth 2 (two) times daily. For 7 days   guaiFENesin 600 MG 12 hr tablet Commonly known as:  MUCINEX Take 2 tablets (1,200 mg total) by mouth 2 (two) times daily as needed.   losartan 25 MG tablet Commonly known as:  COZAAR Take 0.5 tablets (12.5 mg total) by mouth at bedtime.   pantoprazole 40 MG tablet Commonly known as:  PROTONIX Take 1 tablet (40 mg total) by mouth daily.   polyethylene glycol packet Commonly known as:  MIRALAX / GLYCOLAX Take 17 g by mouth 2 (two) times daily.   protein supplement shake Liqd Commonly known as:  PREMIER PROTEIN Take 325 mLs (11 oz total) by mouth 3 (three) times daily with meals.   rosuvastatin 20 MG tablet Commonly known as:  CRESTOR Take 1 tablet (20 mg total) by mouth daily at 6 PM.   spironolactone 25 MG tablet Commonly known as:  ALDACTONE Take 0.5 tablets (12.5 mg total) by mouth daily.   tamsulosin 0.4 MG Caps capsule Commonly known as:  FLOMAX Take 0.4 mg by mouth at bedtime.   traZODone 50 MG tablet Commonly known as:  DESYREL Take 0.5-1 tablets (25-50 mg total) by mouth at bedtime as needed for sleep.   warfarin 3 MG tablet Commonly known as:  COUMADIN Take 1 tablet (3 mg total) by mouth daily at 6 PM. What changed:    medication  strength  how much to take       Contact information for follow-up providers    Marcello Fennel, MD Follow up.   Specialty:  Physical Medicine and Rehabilitation Why:  office will call you with follow up appointment Contact information: [address] [place] [address] [phone]        Kerin Perna, MD Follow up on 03/15/2018.   Specialty:  Cardiothoracic Surgery Why:  Appointment at 4:30 pm Contact information: [address] [place] [address] [phone]  Laurey Morale, MD Follow up.   Specialty:  Cardiology Contact information: [address] [place] [address] [phone]        Prescilla Sours, FNP Follow up on 03/01/2018.   Specialty:  Family Medicine Why:  FOR Dr. Maretta Los there at 8:30 am/Voiding trial.  Contact information: [address] [place] [address] [phone]            Contact information for after-discharge care    Destination    [facility] .   Service:  Skilled Nursing Contact information: [address] [place] [address] [phone]                  Signed: Jacquelynn Cree 02/24/2018, 10:10 AM

## 2018-02-20 NOTE — Progress Notes (Signed)
Speech Language Pathology Daily Session Note  Patient Details  Name: Nathan [name]. MRN: [id number] Date of Birth: [birth year]  Today's Date: 02/20/2018 SLP Individual Time: 1100-1155 SLP Individual Time Calculation (min): 55 min  Short Term Goals: Week 3: SLP Short Term Goal 1 (Week 3): STG=LTG due to remaining length of stay  Skilled Therapeutic Interventions: Skilled treatment session focused on cognitive goals. SP facilitated session by providing supervision verbal cues for utilization of his memory notebook as a compensatory strategy to recall 5 items after a 10 minute delay. Patient recalled and locate all 5 items with Mod I. Patient also recalled events from previous therapy sessions with supervision verbal cues but was perseverative on discharging to a nursing home. Patient left upright in wheelchair with alarm on and all needs within reach. Continue with current plan of care.      Function:   Cognition Comprehension Comprehension assist level: Follows basic conversation/direction with extra time/assistive device  Expression   Expression assist level: Expresses complex ideas: With extra time/assistive device  Social Interaction Social Interaction assist level: Interacts appropriately 75 - 89% of the time - Needs redirection for appropriate language or to initiate interaction.  Problem Solving Problem solving assist level: Solves basic problems with no assist  Memory Memory assist level: Recognizes or recalls 50 - 74% of the time/requires cueing 25 - 49% of the time    Pain No/Denies Pain   Therapy/Group: Individual Therapy  Danny Zimny 02/20/2018, 3:01 PM

## 2018-02-20 NOTE — Progress Notes (Signed)
Social Work Patient ID: Nathan Philipsandleman D Stevick Jr., male   DOB: Nov 29, 1945, 72 y.o.   MRN: [id number]  Wife would like to pursue short term NHP. 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

## 2018-02-20 NOTE — Progress Notes (Signed)
Physical Therapy Session Note  Patient Details  Name: Nathan PhilipsRandleman D Gruner Jr. MRN: [id number] Date of Birth: 1946-01-25  Today's Date: 02/20/2018 PT Individual Time: 1000-1100 PT Individual Time Calculation (min): 60 min   Short Term Goals: Week 2:  PT Short Term Goal 1 (Week 2): =LTG due to estimated LOS  Skilled Therapeutic Interventions/Progress Updates: Pt received seated in w/c, denies pain and agreeable to treatment. BLE w/c propulsion x75' until fatigued; pushed remaining distance totalA for energy conservation. Gait x75' with RW, LLE ace wrap dorsiflexion assist, min guard, min cues for LLE foot clearance, however improving gait speed, step length, L foot clearance overall. TUG performed x2 trials, 40 sec and 36 sec with average 38 sec, RW and min guard overall. During TUG pt performed step-to pattern with LLE; discussed normalized gait pattern with direction changes. Performed gait weaving around cones with RW and min guard; improved gait pattern with smoother direction changes for increased efficiency; performed x2 trials with prolonged seated rest breaks between d/t fatigue. Sit <>stand no UE support x5 reps with min guard. Standing heel raises x15 reps BUE support on RW. Returned to room totalA. Stand pivot to bed with RW and min guard. Remained in bed, alarm intact, all needs in reach.       Therapy Documentation Precautions:  Precautions Precautions: Fall, Sternal Restrictions Weight Bearing Restrictions: No Other Position/Activity Restrictions: sternal precautions Pain: Pain Assessment Pain Scale: 0-10 Pain Score: 0-No pain   See Function Navigator for Current Functional Status.   Therapy/Group: Individual Therapy  Nathan [name] 02/20/2018, 10:54 AM

## 2018-02-20 NOTE — Significant Event (Addendum)
Hypoglycemic Event  CBG: 57  Treatment: 15 GM carbohydrate snack  Symptoms: None  Follow-up CBG: Time:2210 CBG Result:83  Possible Reasons for Event: Unknown  Comments/MD notified:yes    Gelene MinkSusan J Tywaun Hiltner

## 2018-02-20 NOTE — Final Consult Note (Signed)
Consultant Final Sign-Off Note    Assessment/Final recommendations  Nathan D Nathan RamusFerree Jr. is a 72 y.o. male followed by me for hematuria.  His urine is clear in the foley bag and he has no complaints associated with the catheter.   Wound care (if applicable):    Diet at discharge: per primary team   Activity at discharge: per primary team   Follow-up appointment:  He needs to arrange f/u with Dr. Debroah Balleroberto Chao in [place] for a voiding trial after discharge.    Pending results:  Unresulted Labs (From admission, onward)    Start     Ordered   02/09/18 0500  Basic metabolic panel  Every 72 hours,   R    Question:  Specimen collection method  Answer:  Unit=Unit collect   02/06/18 0931   02/09/18 0500  CBC  Every 72 hours,   R    Question:  Specimen collection method  Answer:  Unit=Unit collect   02/06/18 0932   02/04/18 0835  Protime-INR  Daily,   R     02/04/18 0834           Medication recommendations:   Other recommendations:    Thank you for allowing us to participate in the care of your patient!  Please consult us again if you have further needs for your patient.  Nathan [name] Nathan [name] 02/20/2018 7:28 AM    Subjective     Objective  Vital signs in last 24 hours: Temp:  [97.9 F (36.6 C)-98.4 F (36.9 C)] 97.9 F (36.6 C) (08/26 0358) Pulse Rate:  [75-90] 75 (08/26 0358) Resp:  [16-17] 16 (08/25 2024) BP: (92-111)/(62-77) 107/72 (08/26 0358) SpO2:  [98 %-100 %] 99 % (08/26 0358) Weight:  [84.7 kg] 84.7 kg (08/26 0444)  General: WD, WN in NAD. GU: urine clear in foley tubing.    Pertinent labs and Studies: Recent Labs    02/18/18 0445  WBC 8.4  HGB 10.2*  HCT 31.3*   BMET Recent Labs    02/18/18 0445  NA 136  K 4.1  CL 104  CO2 24  GLUCOSE 51*  BUN 13  CREATININE 0.98  CALCIUM 8.9   No results for input(s): LABURIN in the last 72 hours. Results for orders placed or performed during the hospital encounter of 01/23/18  Urine Culture     Status:  Abnormal   Collection Time: 02/04/18  5:47 PM  Result Value Ref Range Status   Specimen Description URINE, CATHETERIZED  Final   Special Requests   Final    Normal Performed at [facility], [address] [place], [place] [address]    Culture >=100,000 COLONIES/mL CITROBACTER KOSERI (A)  Final   Report Status 02/06/2018 FINAL  Final   Organism ID, Bacteria CITROBACTER KOSERI (A)  Final      Susceptibility   Citrobacter koseri - MIC*    CEFAZOLIN <=4 SENSITIVE Sensitive     CEFTRIAXONE <=1 SENSITIVE Sensitive     CIPROFLOXACIN <=0.25 SENSITIVE Sensitive     GENTAMICIN <=1 SENSITIVE Sensitive     IMIPENEM <=0.25 SENSITIVE Sensitive     NITROFURANTOIN 32 SENSITIVE Sensitive     TRIMETH/SULFA <=20 SENSITIVE Sensitive     PIP/TAZO <=4 SENSITIVE Sensitive     * >=100,000 COLONIES/mL CITROBACTER KOSERI    Imaging: No results found.

## 2018-02-20 NOTE — Progress Notes (Signed)
Speech Language Pathology Daily Session Note  Patient Details  Name: Nathan [name] D Venuti Jr. MRN: [id number] Date of Birth: 16-Mar-1946  Today's Date: 02/20/2018 SLP Individual Time: 1500-1530 SLP Individual Time Calculation (min): 30 min  Short Term Goals: Week 3: SLP Short Term Goal 1 (Week 3): STG=LTG due to remaining length of stay  Skilled Therapeutic Interventions:Skilled ST services focused on cognitive skills. SLP facilitated recall of today's events, pt required supervision A verbal cues to utilize memory book. SLP facilitated mildly complex problem solving skills, with simple deductive reasoning task pt required mod A verbal cues, however fatigue was likely a factor. Pt preservated on pending SNF placement, requiring redirection of task. Pt was left in room with call bell within reach and bed alaram set.ST reccomends to continue skilled ST services.     Function:  Eating Eating                 Cognition Comprehension Comprehension assist level: Follows basic conversation/direction with extra time/assistive device  Expression   Expression assist level: Expresses complex ideas: With extra time/assistive device  Social Interaction Social Interaction assist level: Interacts appropriately 75 - 89% of the time - Needs redirection for appropriate language or to initiate interaction.  Problem Solving Problem solving assist level: Solves basic problems with no assist  Memory Memory assist level: Recognizes or recalls 50 - 74% of the time/requires cueing 25 - 49% of the time    Pain Pain Assessment Pain Score: 0-No pain  Therapy/Group: Individual Therapy  Cypress Hinkson  [facility] 02/20/2018, 3:52 PM

## 2018-02-20 NOTE — Progress Notes (Signed)
Occupational Therapy Session Note  Patient Details  Name: Nathan PhilipsRandleman D Wideman Jr. MRN: [id number] Date of Birth: 1945-11-22  Today's Date: 02/20/2018 OT Individual Time: 2956-21300730-0830 OT Individual Time Calculation (min): 60 min    Short Term Goals: Week 3:  OT Short Term Goal 1 (Week 3): STG=LTG due to LOS  Skilled Therapeutic Interventions/Progress Updates:    Pt seen for OT ADL bathing/dressing session. Pt sitting upright in bed upon arrival eatiing breakfast, agreeable to tx session. He transferred to EOB to eat remainder of meal using hospital bed functions and VCs for redirection to task. Pt informing therapist that he and wife decided that adequate 24 hr care is not available at home and deciding SNF following rehab admission will be needed, therapist to make CSW and therapy team aware.  Pt completed sit>stand to RW throughout session with min A- CGA throughout session, pushing with one UE on armrest/ grab bars while still maintaining sternal pre-cautions. He ambulated throughout room with min A, demonstrating improved L LE foot clearance during ambulation, min cuing for RW management in functional context. Pt completed 3/3 toileting tasks with steadying assist. He transitioned into walk in shower and bathed seated on 3-1 BSC, VCs for attention and thoroughness to all areas.  He ambulated out of bathroom and dressed seated in w/c, steadying assist while pulling pants up stnaidng at RW. Addressed reciprrocal scooting in w/c, demonstration and cuing provided and pt able to return demonstrate going forward and backward. Pt left seated in w/c at end of session, chair belt on and all needs in reach.   Therapy Documentation Precautions:  Precautions Precautions: Fall, Sternal Restrictions Weight Bearing Restrictions: No Other Position/Activity Restrictions: sternal precautions Pain:   No/denies pain ADL: ADL ADL Comments: Please see functional navigator  See Function Navigator for  Current Functional Status.   Therapy/Group: Individual Therapy  Lexxie Winberg L 02/20/2018, 7:06 AM

## 2018-02-21 ENCOUNTER — Inpatient Hospital Stay (HOSPITAL_COMMUNITY): Payer: Self-pay | Admitting: Speech Pathology

## 2018-02-21 ENCOUNTER — Inpatient Hospital Stay (HOSPITAL_COMMUNITY): Payer: Self-pay | Admitting: Physical Therapy

## 2018-02-21 ENCOUNTER — Inpatient Hospital Stay (HOSPITAL_COMMUNITY): Payer: Self-pay | Admitting: Occupational Therapy

## 2018-02-21 LAB — BASIC METABOLIC PANEL
Anion gap: 8 (ref 5–15)
BUN: 14 mg/dL (ref 8–23)
CHLORIDE: 103 mmol/L (ref 98–111)
CO2: 24 mmol/L (ref 22–32)
CREATININE: 0.96 mg/dL (ref 0.61–1.24)
Calcium: 8.9 mg/dL (ref 8.9–10.3)
GFR calc Af Amer: >60 mL/min (ref >60)
GFR calc non Af Amer: >60 mL/min (ref >60)
Glucose, Bld: 88 mg/dL (ref 70–99)
POTASSIUM: 4.4 mmol/L (ref 3.5–5.1)
SODIUM: 135 mmol/L (ref 135–145)

## 2018-02-21 LAB — GLUCOSE, CAPILLARY
GLUCOSE-CAPILLARY: 72 mg/dL (ref 70–99)
GLUCOSE-CAPILLARY: 132 mg/dL — AB (ref 70–99)
GLUCOSE-CAPILLARY: 102 mg/dL — AB (ref 70–99)
GLUCOSE-CAPILLARY: 99 mg/dL (ref 70–99)
GLUCOSE-CAPILLARY: 50 mg/dL — AB (ref 70–99)
Glucose-Capillary: 103 mg/dL — ABNORMAL HIGH (ref 70–99)
Glucose-Capillary: 155 mg/dL — ABNORMAL HIGH (ref 70–99)
Glucose-Capillary: 69 mg/dL — ABNORMAL LOW (ref 70–99)
Glucose-Capillary: 74 mg/dL (ref 70–99)

## 2018-02-21 LAB — CBC
HEMATOCRIT: 31.4 % — AB (ref 39.0–52.0)
Hemoglobin: 10.4 g/dL — ABNORMAL LOW (ref 13.0–17.0)
MCH: 30.1 pg (ref 26.0–34.0)
MCHC: 33.1 g/dL (ref 30.0–36.0)
MCV: 90.8 fL (ref 78.0–100.0)
PLATELETS: 263 10*3/uL (ref 150–400)
RBC: 3.46 MIL/uL — ABNORMAL LOW (ref 4.22–5.81)
RDW: 13.4 % (ref 11.5–15.5)
WBC: 6.4 10*3/uL (ref 4.0–10.5)

## 2018-02-21 LAB — PROTIME-INR
INR: 2.29
Prothrombin Time: 25 seconds — ABNORMAL HIGH (ref 11.4–15.2)

## 2018-02-21 MED ORDER — GLIPIZIDE ER 5 MG PO TB24: 5.0000 mg | ORAL_TABLET | Freq: Every day | ORAL | Status: DC

## 2018-02-21 MED ORDER — WARFARIN SODIUM 3 MG PO TABS
3.0000 mg | ORAL_TABLET | Freq: Every day | ORAL | Status: DC
  Administered 2018-02-21 – 2018-02-23 (×3): 3 mg via ORAL
  Filled 2018-02-21 (×3): qty 1

## 2018-02-21 MED ORDER — GLIPIZIDE ER 2.5 MG PO TB24
2.5000 mg | ORAL_TABLET | Freq: Every day | ORAL | Status: DC
  Administered 2018-02-21: 2.5 mg via ORAL
  Filled 2018-02-21 (×2): qty 1

## 2018-02-21 NOTE — Progress Notes (Signed)
ANTICOAGULATION CONSULT NOTE-Follow Up Note  Pharmacy Consult for warfarin Indication: atrial fibrillation  Allergies  Allergen Reactions  . Fentanyl Shortness Of Breath  . Promethazine Hcl Anaphylaxis and Other (See Comments)    Cardiac arrest  . Foeniculum Vulgare Other (See Comments)    Fennel bulbs--nausea only  . Metformin And Related Diarrhea  . Penicillins Nausea Only    Has patient had a PCN reaction causing immediate rash, facial/tongue/throat swelling, SOB or lightheadedness with hypotension: no Has patient had a PCN reaction causing severe rash involving mucus membranes or skin necrosis: no Has patient had a PCN reaction that required hospitalization: no Has patient had a PCN reaction occurring within the last 10 years: yes If all of the above answers are "NO", then may proceed with Cephalosporin use.   . Sulfa Antibiotics Other (See Comments)    G.I. Upset    Patient Measurements: Height: 5' 11.5" (181.6 cm) Weight: 180 lb 5.4 oz (81.8 kg) IBW/kg (Calculated) : 76.45  Vital Signs: Temp: 98.4 F (36.9 C) (08/27 0413) Temp Source: Oral (08/27 0413) BP: 95/59 (08/27 0413) Pulse Rate: 75 (08/27 0413)  Labs: Recent Labs    02/19/18 [id number] 02/20/18 0515 02/21/18 0638  HGB  --   --  10.4*  HCT  --   --  31.4*  PLT  --   --  263  LABPROT 24.7* 23.4* 25.0*  INR 2.25 2.10 2.29  CREATININE  --   --  0.96    Estimated Creatinine Clearance: 78.6 mL/min (by C-G formula based on SCr of 0.96 mg/dL).   Medical History: Past Medical History:  Diagnosis Date  . Atrial fibrillation with RVR (HCC) 05/01/2017  . Cardiomyopathy (HCC) 05/01/2017  . CHF (congestive heart failure) (HCC)   . 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 Coronary artery disease   . DM (diabetes mellitus) type 2, uncontrolled, with ketoacidosis (HCC) 05/01/2017  . Essential hypertension 11/26/2016  . History of kidney stones   . HLD (hyperlipidemia) 05/01/2017  .  IVCD (intraventricular conduction defect) 04/24/2015  . Mitral valve regurgitation   . Mixed dyslipidemia 11/26/2016  . 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 Pharmacy consulted to manage warfarin dosing. Warf recently held 8/15 thru 8/17 due to INR >3.0.   INR today remains therapeutic at 2.29. Looks to be stable now. Will try a daily dose.  Per nursing no signs/symtoms of bleeding noted.  Goal of Therapy:  INR 2-3 Monitor platelets by anticoagulation protocol: Yes   Plan:  Coumadin 3mg  PO qday Monitor daily INR, CBC, clinical course, s/sx of bleed, PO intake, DDI  Ulyses SouthwardMinh Leighton Luster, PharmD, RidgwayBCIDP, AAHIVP, CPP Infectious Disease Pharmacist Pager: [phone] 02/21/2018 8:33 AM

## 2018-02-21 NOTE — Significant Event (Signed)
Hypoglycemic Event  CBG: 50  Treatment: 15 GM carbohydrate snack  Symptoms: None  Follow-up CBG: Time 2300 CBG Result:74 Possible Reasons for Event: Other: adjusting medication  Comments/MD notified:   Nathan MinkSusan J Rosselyn [name]

## 2018-02-21 NOTE — Progress Notes (Signed)
Subjective/Complaints: Patient seen lying in bed this morning. He states his double overnight. He was seen by urology yesterday with no changes in management, notes reviewed. Discussed hypoglycemia with nursing. Patient also notes low CBGs. He states that he would like to go to SNF.  ROS: denies CP, SOB, N/V/D  Objective: Vital Signs: Blood pressure (!) 95/59, pulse 75, temperature 98.4 F (36.9 C), temperature source Oral, resp. rate 16, height 5' 11.5" (1.816 m), weight 81.8 kg, SpO2 99 %. No results found. Results for orders placed or performed during the hospital encounter of 02/03/18 (from the past 72 hour(s))  Glucose, capillary     Status: None   Collection Time: 02/18/18 11:55 AM  Result Value Ref Range   Glucose-Capillary 87 70 - 99 mg/dL  Glucose, capillary     Status: None   Collection Time: 02/18/18  4:43 PM  Result Value Ref Range   Glucose-Capillary 87 70 - 99 mg/dL  Glucose, capillary     Status: None   Collection Time: 02/18/18  9:15 PM  Result Value Ref Range   Glucose-Capillary 83 70 - 99 mg/dL  Glucose, capillary     Status: None   Collection Time: 02/19/18  6:20 AM  Result Value Ref Range   Glucose-Capillary 88 70 - 99 mg/dL  Protime-INR     Status: Abnormal   Collection Time: 02/19/18  6:42 AM  Result Value Ref Range   Prothrombin Time 24.7 (H) 11.4 - 15.2 seconds   INR 2.25     Comment: Performed at [facility], [place] [address] [place], [place] [address]  Glucose, capillary     Status: Abnormal   Collection Time: 02/19/18 11:57 AM  Result Value Ref Range   Glucose-Capillary 163 (H) 70 - 99 mg/dL  Glucose, capillary     Status: Abnormal   Collection Time: 02/19/18  4:45 PM  Result Value Ref Range   Glucose-Capillary 112 (H) 70 - 99 mg/dL  Glucose, capillary     Status: Abnormal   Collection Time: 02/19/18 10:19 PM  Result Value Ref Range   Glucose-Capillary 107 (H) 70 - 99 mg/dL  Protime-INR     Status: Abnormal   Collection Time: 02/20/18   5:15 AM  Result Value Ref Range   Prothrombin Time 23.4 (H) 11.4 - 15.2 seconds   INR 2.10     Comment: Performed at [facility], [place] [address], [place], [place] [address]  Glucose, capillary     Status: Abnormal   Collection Time: 02/20/18  6:33 AM  Result Value Ref Range   Glucose-Capillary 100 (H) 70 - 99 mg/dL  Glucose, capillary     Status: Abnormal   Collection Time: 02/20/18 11:50 AM  Result Value Ref Range   Glucose-Capillary 164 (H) 70 - 99 mg/dL  Glucose, capillary     Status: Abnormal   Collection Time: 02/20/18  4:45 PM  Result Value Ref Range   Glucose-Capillary 107 (H) 70 - 99 mg/dL  Glucose, capillary     Status: Abnormal   Collection Time: 02/20/18  9:32 PM  Result Value Ref Range   Glucose-Capillary 57 (L) 70 - 99 mg/dL   Comment 1 Notify RN   Glucose, capillary     Status: None   Collection Time: 02/20/18 10:21 PM  Result Value Ref Range   Glucose-Capillary 83 70 - 99 mg/dL  Glucose, capillary     Status: Abnormal   Collection Time: 02/21/18 12:50 AM  Result Value Ref Range  Glucose-Capillary 69 (L) 70 - 99 mg/dL  Glucose, capillary     Status: Abnormal   Collection Time: 02/21/18  1:28 AM  Result Value Ref Range   Glucose-Capillary 103 (H) 70 - 99 mg/dL  Glucose, capillary     Status: None   Collection Time: 02/21/18  6:36 AM  Result Value Ref Range   Glucose-Capillary 72 70 - 99 mg/dL  Protime-INR     Status: Abnormal   Collection Time: 02/21/18  6:38 AM  Result Value Ref Range   Prothrombin Time 25.0 (H) 11.4 - 15.2 seconds   INR 2.29     Comment: Performed at [facility] [address], [place], [place] [address]  Basic metabolic panel     Status: None   Collection Time: 02/21/18  6:38 AM  Result Value Ref Range   Sodium 135 135 - 145 mmol/L   Potassium 4.4 3.5 - 5.1 mmol/L   Chloride 103 98 - 111 mmol/L   CO2 24 22 - 32 mmol/L   Glucose, Bld 88 70 - 99 mg/dL   BUN 14 8 - 23 mg/dL   Creatinine, Ser 0.96 0.61 - 1.24 mg/dL    Calcium 8.9 8.9 - 10.3 mg/dL   GFR calc non Af Amer >60 >60 mL/min   GFR calc Af Amer >60 >60 mL/min    Comment: (NOTE) The eGFR has been calculated using the CKD EPI equation. This calculation has not been validated in all clinical situations. eGFR's persistently <60 mL/min signify possible Chronic Kidney Disease.    Anion gap 8 5 - 15    Comment: Performed at [facility] [address], [place], [place] [address]  CBC     Status: Abnormal   Collection Time: 02/21/18  6:38 AM  Result Value Ref Range   WBC 6.4 4.0 - 10.5 K/uL   RBC 3.46 (L) 4.22 - 5.81 MIL/uL   Hemoglobin 10.4 (L) 13.0 - 17.0 g/dL   HCT 31.4 (L) 39.0 - 52.0 %   MCV 90.8 78.0 - 100.0 fL   MCH 30.1 26.0 - 34.0 pg   MCHC 33.1 30.0 - 36.0 g/dL   RDW 13.4 11.5 - 15.5 %   Platelets 263 150 - 400 K/uL    Comment: Performed at [facility], [place] [address], [place], [place] [address]     Constitutional: No distress . Vital signs reviewed. HENT: Normocephalic.  Atraumatic. Eyes: EOMI. No discharge. Cardiovascular: RRR. No JVD. Respiratory: CTA bilaterally. Normal effort. GI: BS +. Non-distended. Musc: No edema or tenderness in extremities. Neuro: Alert and Oriented Motor:  Left upper extremity: 4+5/5 proximal distal (unchanged) Left lower extremity: 4/5 proximal distal (unchanged) Skin:   Intact. Warm and dry.  Assessment/Plan: 1. Functional deficits secondary to RIght MCA infarct  which require 3+ hours per day of interdisciplinary therapy in a comprehensive inpatient rehab setting. Physiatrist is providing close team supervision and 24 hour management of active medical problems listed below. Physiatrist and rehab team continue to assess barriers to discharge/monitor patient progress toward functional and medical goals. FIM: Function - Bathing Position: Shower Body parts bathed by patient: Right arm, Left arm, Chest, Abdomen, Right upper leg, Left upper leg, Right lower leg, Left lower leg, Front  perineal area, Buttocks, Back Body parts bathed by helper: Buttocks, Back Bathing not applicable: Front perineal area, Buttocks, Right upper leg, Left upper leg, Right lower leg, Left lower leg(Declined LB bathing) Assist Level: Touching or steadying assistance(Pt > 75%)  Function- Upper Body Dressing/Undressing What is the patient wearing?: Pull over shirt/dress Pull over shirt/dress - Perfomed by patient: Thread/unthread right sleeve, Thread/unthread left sleeve, Pull shirt over trunk, Put head through opening Pull over shirt/dress - Perfomed by helper: Put head through opening, Pull shirt over trunk Assist Level: Supervision or verbal cues Set up : To obtain clothing/put away Function - Lower Body Dressing/Undressing What is the patient wearing?: Pants, Shoes Position: Wheelchair/chair at sink Pants- Performed by patient: Thread/unthread right pants leg, Thread/unthread left pants leg, Pull pants up/down Pants- Performed by helper: Thread/unthread right pants leg, Thread/unthread left pants leg, Pull pants up/down Non-skid slipper socks- Performed by patient: Don/doff right sock, Don/doff left sock Non-skid slipper socks- Performed by helper: Don/doff right sock, Don/doff left sock Shoes - Performed by patient: Don/doff right shoe, Don/doff left shoe, Fasten right, Fasten left Shoes - Performed by helper: Fasten right, Fasten left TED Hose - Performed by helper: Don/doff right TED hose, Don/doff left TED hose Assist for footwear: Supervision/touching assist Assist for lower body dressing: Touching or steadying assistance (Pt > 75%)  Function - Toileting Toileting activity did not occur: No continent bowel/bladder event Toileting steps completed by patient: Adjust clothing prior to toileting, Performs perineal hygiene, Adjust clothing after toileting Toileting steps completed by helper: Adjust clothing prior to toileting, Performs perineal hygiene, Adjust clothing after  toileting Toileting Assistive Devices: Other (comment)(foley cath) Assist level: Touching or steadying assistance (Pt.75%)  Function - Toilet Transfers Toilet transfer activity did not occur: Refused Toilet transfer assistive device: Elevated toilet seat/BSC over toilet, Walker Assist level to toilet: Touching or steadying assistance (Pt > 75%) Assist level from toilet: Touching or steadying assistance (Pt > 75%) Assist level to bedside commode (at bedside): Touching or steadying assistance (Pt > 75%) Assist level from bedside commode (at bedside): Touching or steadying assistance (Pt > 75%)  Function - Chair/bed transfer Chair/bed transfer method: Stand pivot Chair/bed transfer assist level: Touching or steadying assistance (Pt > 75%) Chair/bed transfer assistive device: Walker Chair/bed transfer details: Verbal cues for sequencing, Verbal cues for technique, Verbal cues for precautions/safety, Manual facilitation for weight shifting, Manual facilitation for placement  Function - Locomotion: Wheelchair Will patient use wheelchair at discharge?: No Type: Manual Max wheelchair distance: 50 Assist Level: Supervision or verbal cues Assist Level: Supervision or verbal cues Function - Locomotion: Ambulation Ambulation activity did not occur: Safety/medical concerns Assistive device: Walker-rolling Max distance: 75 Assist level: Touching or steadying assistance (Pt > 75%) Assist level: Touching or steadying assistance (Pt > 75%) Assist level: Touching or steadying assistance (Pt > 75%)  Function - Comprehension Comprehension: Auditory Comprehension assist level: Follows basic conversation/direction with extra time/assistive device  Function - Expression Expression: Verbal Expression assist level: Expresses complex ideas: With extra time/assistive device  Function - Social Interaction Social Interaction assist level: Interacts appropriately 75 - 89% of the time - Needs redirection  for appropriate language or to initiate interaction.  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 DVT Prophylaxis/Anticoagulation:  Pharmaceutical:Coumadin per pharm protocol   INR therapeutic on B/27 3. Pain Management:prn tylenol. Oxycodone or tramadol for more severe pain 4. Mood:LCSW to follow for evaluation and support. 5. Neuropsych: This patientis fullycapable of making decisions on hisown behalf. 6. Skin/Wound Care:Added protein supplements between meals 7. Fluids/Electrolytes/Nutrition:Strict I/O. Intake poor due to recent teeth extraction.   BMP within normal range on 8/27 8. CAD s/p CABG with MVR: Monitor for symptoms with activity. On coumadin and Lipitor--coreg added  9.PAF: Cont warfarin, coreg  10. R-MCA stroke with extension: Left sided weakness resolving. On coumadin.  11. Chronic systolic CHF: Encourage IS. Monitor weights daily with strict I/O. Cont meds Filed Weights   02/19/18 0356 02/20/18 0444 02/21/18 0416  Weight: 87.2 kg 84.7 kg 81.8 kg   ? Reliability 12. H/o BPH/Acute Urinary retention:   Cont foley at present per urology until outpatient follow up, urology signed off 13. Constipation: Increased mirlax to bid 14. ABLA/ Continue to monitor H/H for recovery.    Hemoglobin 10.4 on 8/27  Continue to monitor 15. Hyponatremia: Continue to monitor for now.   Sodium 136 on 8/24  Continue to monitor 16. T2DM: Monitor BS ac/hs.  Levemir daily d/ced on  8/21  Glipizide started on 8/21, decreased to 2.5 mg on 8/27  SSI for tighter BS control.  Repaglinide d/ced on 8/25 CBG (last 3)  Recent Labs    02/21/18 0050 02/21/18 0128 02/21/18 0636  GLUCAP 69* 103* 72   Labile of hypoglycemia  A FACE TO FACE EVALUATION WAS PERFORMED Montford Barg Lorie Phenix 02/21/2018, 8:28 AM

## 2018-02-21 NOTE — Progress Notes (Signed)
Social Work Patient ID: Nathan [name]., male   DOB: [birth year], 72 y.o.   MRN: [id number]  Met with pt to inform waiting to hear form [employer] in [place] and have contacted them. Have also texted wife. Pt want worker to pursue [employer] also in [place]. He feels he needs to get out of here. Work on NH bed for pt.

## 2018-02-21 NOTE — Progress Notes (Signed)
Occupational Therapy Session Note  Patient Details  Name: Nathan [name]. MRN: [id number] Date of Birth: 04/04/1946  Today's Date: 02/21/2018 OT Individual Time: 4098-11910730-0845 OT Individual Time Calculation (min): 75 min    Short Term Goals: Week 3:  OT Short Term Goal 1 (Week 3): STG=LTG due to LOS  Skilled Therapeutic Interventions/Progress Updates:    Pt seen for OT session focusing on ADL re-training, functional transfers and functional activity tolerance in sitting and standing positions. Pt awake in supine upon arrival with MD present completing morning Maresa Morash. Pt agreeable to tx session. He transferred to EOB with supervision using hospital bed functions. Seated EOB, pt completed set-up of breakfast items with VCs for sequencing/awareness, demonstrates ability to use B UEs at functional level. Throughout session, pt remains hyper-verbal, requiring frequent redirection to task as well as cuing for anxiety management.  He dressed seated EOB, required increased time to problem solve clothing orientation independently as well as assist to manage cathter through pants. He stood with min A from EOB to RW and pulled pant sup with guarding assist. He ambulated short distance to sink, however, once as sink voicing increased fatigue and requiring seated rest break. prior to standing to brush hair. Pt self propelled w/c using B LEs ~1615ft with supervision before fatiguing and being taken remainder of way in w/c for time and energy conservation.  Upon transfering to EOM in gym, pt with increased complaints of dizziness, RN made aware, BS and BP chedk, BS 131, BP sitting EOM 80/71, TED hose donned and BP standing with TED hose 101/84.  Seated EOM, pt led through deep breathing exercises in order to reduce anxiety with current situation. Pt then completed nuts/bolts activity using B UEs from seated position requiring significantly increased time and rest breaks throughout seated task. Pt then completed  sit>stand with supervision to RW and removed nuts/blots with close supervision, VCs for deep breathing and reassurance/encouragement. PT completed short distance ambulation back to w/c with CGA. Pt returned to room at end of session, left seated in w/c with all needs in reach, chair belt alarm on.     Therapy Documentation Precautions:  Precautions Precautions: Fall, Sternal Restrictions Weight Bearing Restrictions: No Other Position/Activity Restrictions: sternal precautions Pain:   No/denies pain ADL: ADL ADL Comments: Please see functional navigator  See Function Navigator for Current Functional Status.   Therapy/Group: Individual Therapy  Kensleigh Gates L 02/21/2018, 7:41 AM

## 2018-02-21 NOTE — Progress Notes (Addendum)
Occupational Therapy Session Note  Patient Details  Name: Nathan [name]. MRN: [id number] Date of Birth: [birth year]  Today's Date: 02/21/2018 OT Individual Time: 1400-1430 OT Individual Time Calculation (min): 30 min    Short Term Goals: Week 1:  OT Short Term Goal 1 (Week 1): Pt will complete toilet transfer with mod A OT Short Term Goal 1 - Progress (Week 1): Met OT Short Term Goal 2 (Week 1): Pt will complete LB dressing with mod A  OT Short Term Goal 2 - Progress (Week 1): Met OT Short Term Goal 3 (Week 1): Pt will follow sternal precautions with min verbal cues within BADL session.  OT Short Term Goal 3 - Progress (Week 1): Met Week 2:  OT Short Term Goal 1 (Week 2): Pt will ambulate into bathroom with min A to increase functional ambulation abilities OT Short Term Goal 1 - Progress (Week 2): Met OT Short Term Goal 2 (Week 2): Pt will complete 2 grooming tasks in standing in order to increase functional activity tolerance and endurance OT Short Term Goal 2 - Progress (Week 2): Met OT Short Term Goal 3 (Week 2): Pt will complete full bathing/dressing task with min cuing for re-direction to task. OT Short Term Goal 3 - Progress (Week 2): Not met  Skilled Therapeutic Interventions/Progress Updates:    1:1 Focus on sit to stands, standing balance while perform short distance functional ambulation with RW and laundry task. Pt unable to stand or ambulate for longer than 3 min before reporting dizziness and fatigue. Pt performed laundry task in standing with min guard. Pt returned to room propelling his w/c with bilateral feet with extra time at slow pace. Provided rest breaks as needed.   Therapy Documentation Precautions:  Precautions Precautions: Fall, Sternal Restrictions Weight Bearing Restrictions: No Other Position/Activity Restrictions: sternal precautions Pain: Pain Assessment Pain Scale: 0-10 Pain Score: 0-No pain Patients Stated Pain Goal: 0 ADL: ADL ADL  Comments: Please see functional navigator  See Function Navigator for Current Functional Status.   Therapy/Group: Individual Therapy  Willeen Cass [facility] 02/21/2018, 3:11 PM

## 2018-02-21 NOTE — Significant Event (Signed)
Hypoglycemic Event  CBG: 69  Treatment: 15 GM carbohydrate snack  Symptoms: None  Follow-up CBG: Time:0125 CBG Result:*103  Possible Reasons for Event: Unknown  Comments/MD notified no    Gelene MinkSusan J Azarius Lambson

## 2018-02-21 NOTE — Progress Notes (Signed)
Physical Therapy Session Note  Patient Details  Name: Nathan PhilipsRandleman D Tipler Jr. MRN: [id number] Date of Birth: 06/05/1946  Today's Date: 02/21/2018 PT Individual Time: 1025-1100 PT Individual Time Calculation (min): 35 min   Short Term Goals: Week 2:  PT Short Term Goal 1 (Week 2): =LTG due to estimated LOS  Skilled Therapeutic Interventions/Progress Updates: Pt missed 25 min PT d/t late breakfast tray and requesting to eat while food is still hot. Upon return pt denies pain, agreeable to treatment. Transported totalA to gym for energy conservation. Lateral step ups x5 reps BLE on 6" step with BUE support. Required encouragement to continue performance as pt reports fatigued after one rep, then able to complete 5 reps with cueing/encouragement. Gait with RW x50' with min guard; min tactile cues for L foot clearance as pt fatigued towards end of trial. Second gait trial x50' including stepping over trakking poles; requires moderate cueing for sequencing and technique. Returned to room totalA; remained seated in w/c, chair alarm intact and all needs in reach.      Therapy Documentation Precautions:  Precautions Precautions: Fall, Sternal Restrictions Weight Bearing Restrictions: No Other Position/Activity Restrictions: sternal precautions General: PT Amount of Missed Time (min): 25 Minutes PT Missed Treatment Reason: Other (Comment)(breakfast, late tray) Pain: Pain Assessment Pain Scale: 0-10 Pain Score: 0-No pain Patients Stated Pain Goal: 0  See Function Navigator for Current Functional Status.   Therapy/Group: Individual Therapy  [name] 02/21/2018, 11:00 AM

## 2018-02-21 NOTE — Progress Notes (Signed)
Speech Language Pathology Daily Session Note  Patient Details  Name: Nathan PhilipsRandleman D Dizdarevic Jr. MRN: [id number] Date of Birth: 1946/06/15  Today's Date: 02/21/2018 SLP Individual Time: 0930-1000 SLP Individual Time Calculation (min): 30 min  Short Term Goals: Week 3: SLP Short Term Goal 1 (Week 3): STG=LTG due to remaining length of stay  Skilled Therapeutic Interventions: Skilled treatment session focused on cognitive goals. Upon arrival, patient sitting upright in wheelchair and appeared frustrauted about breakfast meal. SLP facilitated session by providing Min A verbal cues for emergent awareness in regards to need for current diet restrictions and for problem solving in regards to strategies to modify food to maintain restrictions but maximize taste and overall PO intake (use artifical sweetener, crystal light for liquid, condiments without salt). Patient verbalized understanding and appeared excited to try modifcations. Patient left upright in wheelchair with alarm on and all needs within reach. Continue with current plan of care.      Function:   Cognition Comprehension Comprehension assist level: Follows basic conversation/direction with extra time/assistive device  Expression   Expression assist level: Expresses complex ideas: With extra time/assistive device  Social Interaction Social Interaction assist level: Interacts appropriately 75 - 89% of the time - Needs redirection for appropriate language or to initiate interaction.  Problem Solving Problem solving assist level: Solves basic problems with no assist  Memory Memory assist level: Recognizes or recalls 50 - 74% of the time/requires cueing 25 - 49% of the time    Pain Pain Assessment Pain Scale: 0-10 Pain Score: 0-No pain Patients Stated Pain Goal: 0  Therapy/Group: Individual Therapy  Phat Dalton 02/21/2018, 11:42 AM

## 2018-02-21 NOTE — Plan of Care (Signed)
Pt had low BP during morning PT but patient about to complete without issues.  Pt had no c/o pain and currently waiting SNF placement

## 2018-02-22 ENCOUNTER — Inpatient Hospital Stay (HOSPITAL_COMMUNITY): Payer: Self-pay

## 2018-02-22 ENCOUNTER — Inpatient Hospital Stay (HOSPITAL_COMMUNITY): Payer: Self-pay | Admitting: Speech Pathology

## 2018-02-22 ENCOUNTER — Inpatient Hospital Stay (HOSPITAL_COMMUNITY): Payer: Self-pay | Admitting: Occupational Therapy

## 2018-02-22 LAB — PROTIME-INR
INR: 2.18
PROTHROMBIN TIME: 24 s — AB (ref 11.4–15.2)

## 2018-02-22 LAB — URINALYSIS, ROUTINE W REFLEX MICROSCOPIC
Bilirubin Urine: NEGATIVE
Glucose, UA: NEGATIVE mg/dL
Ketones, ur: NEGATIVE mg/dL
Nitrite: NEGATIVE
Protein, ur: NEGATIVE mg/dL
Specific Gravity, Urine: 1.011 (ref 1.005–1.030)
pH: 6 (ref 5.0–8.0)

## 2018-02-22 LAB — GLUCOSE, CAPILLARY
GLUCOSE-CAPILLARY: 64 mg/dL — AB (ref 70–99)
GLUCOSE-CAPILLARY: 80 mg/dL (ref 70–99)
GLUCOSE-CAPILLARY: 84 mg/dL (ref 70–99)
Glucose-Capillary: 80 mg/dL (ref 70–99)
Glucose-Capillary: 150 mg/dL — ABNORMAL HIGH (ref 70–99)
Glucose-Capillary: 88 mg/dL (ref 70–99)

## 2018-02-22 NOTE — Progress Notes (Signed)
Occupational Therapy Session Note  Patient Details  Name: Nathan [name]. MRN: [id number] Date of Birth: 05/18/1946  Today's Date: 02/22/2018 OT Individual Time: 0730-0830 OT Individual Time Calculation (min): 60 min    Short Term Goals: Week 3:  OT Short Term Goal 1 (Week 3): STG=LTG due to LOS  Skilled Therapeutic Interventions/Progress Updates:    Pt supine in bed upon entry with no reports of pain. Pt transferred EOB with min verbal and tactile cues from therapist for hand placement and adherence to sternal precautions. Sit to stand transfer EOB - RW with close supervision for safety with pt then ambulating to shower chair in bathroom requiring close supervision with max verbal encouragement due to pt reporting he felt tired and requesting seated rest break. Pt initiated and completed lower leg bathing, however he then required verbal cuing and supervision throughout remainder of shower session to initiate and complete remainder of bathing due to poor internally distracted during task. Pt ambulated to w/c and performed UB/LB dressing and grooming tasks with supervision. Pt required close supervision for standing portions of grooming and clothing management for safety. Pt returned to w/c and positioned in room for meal tray set up, all needs in reach, and chair belt activated.   Therapy Documentation Precautions:  Precautions Precautions: Fall, Sternal Restrictions Weight Bearing Restrictions: No Other Position/Activity Restrictions: sternal precautions      See Function Navigator for Current Functional Status.   Therapy/Group: Individual Therapy  Shaquina Gillham 02/22/2018, 10:14 AM

## 2018-02-22 NOTE — Significant Event (Signed)
Hypoglycemic Event  CBG: 64  Treatment: 15 GM gel  Symptoms: None  0517 CBG Result:80  Possible Reasons for Event: Medication regimen: adjusting meds  Comments/MD notified:   Gelene MinkSusan J Rashard Ryle

## 2018-02-22 NOTE — Progress Notes (Signed)
Physical Therapy Weekly Progress Note  Patient Details  Name: Nathan PhilipsRandleman D Verhagen Jr. MRN: [id number] Date of Birth: May 31, 1946  Beginning of progress report period: February 13, 2018 End of progress report period: February 22, 2018  Today's Date: 02/22/2018   Short term goals not set due to estimated length of stay.  Patient currently requires supervision to minA for bed mobility, minA sit <>stand, and minA to min guard gait up to 60' with RW. Continues to be limited by decreased LE strength, impaired aerobic endurance, sternal precautions, and mild L hemiparesis with mild deficits in midline orientation during functional gait tasks. Patient's family unable to provide 24/7 assist at home which was recommended due to physical and cognitive deficits; CSW pursuing SNF placement for continued rehab prior to d/c home.   Patient continues to demonstrate the following deficits muscle weakness, decreased cardiorespiratoy endurance, unbalanced muscle activation, decreased coordination and decreased motor planning, decreased initiation, decreased attention, decreased problem solving, decreased safety awareness, decreased memory and delayed processing and decreased standing balance, decreased postural control, hemiplegia and decreased balance strategies and therefore will continue to benefit from skilled PT intervention to increase functional independence with mobility.  Patient progressing toward long term goals..  Continue plan of care.  PT Short Term Goals Week 2:  PT Short Term Goal 1 (Week 2): =LTG due to estimated LOS Week 3:  PT Short Term Goal 1 (Week 3): =LTG due to estimated LOS   See Function Navigator for Current Functional Status.   Carolynn Commentlizabeth J Gi Or Normaneigle 02/22/2018, 7:38 AM

## 2018-02-22 NOTE — Progress Notes (Signed)
ANTICOAGULATION CONSULT NOTE-Follow Up Note  Pharmacy Consult for warfarin Indication: atrial fibrillation  Allergies  Allergen Reactions  . Fentanyl Shortness Of Breath  . Promethazine Hcl Anaphylaxis and Other (See Comments)    Cardiac arrest  . Foeniculum Vulgare Other (See Comments)    Fennel bulbs--nausea only  . Metformin And Related Diarrhea  . Penicillins Nausea Only    Has patient had a PCN reaction causing immediate rash, facial/tongue/throat swelling, SOB or lightheadedness with hypotension: no Has patient had a PCN reaction causing severe rash involving mucus membranes or skin necrosis: no Has patient had a PCN reaction that required hospitalization: no Has patient had a PCN reaction occurring within the last 10 years: yes If all of the above answers are "NO", then may proceed with Cephalosporin use.   . Sulfa Antibiotics Other (See Comments)    G.I. Upset    Patient Measurements: Height: 5' 11.5" (181.6 cm) Weight: 181 lb 7 oz (82.3 kg) IBW/kg (Calculated) : 76.45  Vital Signs: Temp: 98.8 F (37.1 C) (08/28 0434) Temp Source: Oral (08/28 0434) BP: 89/64 (08/28 0434) Pulse Rate: 77 (08/28 0434)  Labs: Recent Labs    02/20/18 0515 02/21/18 [id number] 02/22/18 0504  HGB  --  10.4*  --   HCT  --  31.4*  --   PLT  --  263  --   LABPROT 23.4* 25.0* 24.0*  INR 2.10 2.29 2.18  CREATININE  --  0.96  --     Estimated Creatinine Clearance: 78.6 mL/min (by C-G formula based on SCr of 0.96 mg/dL).   Medical History: Past Medical History:  Diagnosis Date  . Atrial fibrillation with RVR (HCC) 05/01/2017  . Cardiomyopathy (HCC) 05/01/2017  . CHF (congestive heart failure) (HCC)   . 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 Coronary artery disease   . DM (diabetes mellitus) type 2, uncontrolled, with ketoacidosis (HCC) 05/01/2017  . Essential hypertension 11/26/2016  . History of kidney stones   . HLD (hyperlipidemia) 05/01/2017  . IVCD  (intraventricular conduction defect) 04/24/2015  . Mitral valve regurgitation   . Mixed dyslipidemia 11/26/2016  . 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 Did have some hematuria but had resolved by 8/24. Has been therapeutic on lower dose since admit. INR mostly stable at 2.18. Hgb 10.4, plts wnl.  Goal of Therapy:  INR 2-3 Monitor platelets by anticoagulation protocol: Yes   Plan:  Coumadin 3mg  PO qday Monitor daily INR, CBC, clinical course, s/sx of bleed, PO intake, DDI  Enzo BiNathan Jera Headings, PharmD, BCPS Clinical Pharmacist Phone number [phone] 02/22/2018 8:42 AM

## 2018-02-22 NOTE — Progress Notes (Signed)
Social Work Patient ID: Nathan Philipsandleman D Tribbey Jr., male   DOB: July 08, 1945, 72 y.o.   MRN: [id number]  Spoke with [facility] this am regarding bed offer, pt and wife would like [facility] as their first choice. [facility] will begin the approval process with pt's insurance. Will target Friday to transfer to [facility] since this is when the bed will be available. Pt is very pleased with this and ready to go closer to home. Work toward discharge on Friday.

## 2018-02-22 NOTE — Progress Notes (Signed)
Subjective/Complaints: Patient seen lying in bed this morning. He states he slept well overnight. Per patient and per nursing patient continues to have labile CBGs with symptomatic hypoglycemia..  ROS: denies CP, SOB, N/V/D  Objective: Vital Signs: Blood pressure (!) 89/64, pulse 77, temperature 98.8 F (37.1 C), temperature source Oral, resp. rate 19, height 5' 11.5" (1.816 m), weight 82.3 kg, SpO2 98 %. No results found. Results for orders placed or performed during the hospital encounter of 02/03/18 (from the past 72 hour(s))  Glucose, capillary     Status: Abnormal   Collection Time: 02/19/18 11:57 AM  Result Value Ref Range   Glucose-Capillary 163 (H) 70 - 99 mg/dL  Glucose, capillary     Status: Abnormal   Collection Time: 02/19/18  4:45 PM  Result Value Ref Range   Glucose-Capillary 112 (H) 70 - 99 mg/dL  Glucose, capillary     Status: Abnormal   Collection Time: 02/19/18 10:19 PM  Result Value Ref Range   Glucose-Capillary 107 (H) 70 - 99 mg/dL  Protime-INR     Status: Abnormal   Collection Time: 02/20/18  5:15 AM  Result Value Ref Range   Prothrombin Time 23.4 (H) 11.4 - 15.2 seconds   INR 2.10     Comment: Performed at [facility], [place] [address], [place], [place] [address]  Glucose, capillary     Status: Abnormal   Collection Time: 02/20/18  6:33 AM  Result Value Ref Range   Glucose-Capillary 100 (H) 70 - 99 mg/dL  Glucose, capillary     Status: Abnormal   Collection Time: 02/20/18 11:50 AM  Result Value Ref Range   Glucose-Capillary 164 (H) 70 - 99 mg/dL  Glucose, capillary     Status: Abnormal   Collection Time: 02/20/18  4:45 PM  Result Value Ref Range   Glucose-Capillary 107 (H) 70 - 99 mg/dL  Glucose, capillary     Status: Abnormal   Collection Time: 02/20/18  9:32 PM  Result Value Ref Range   Glucose-Capillary 57 (L) 70 - 99 mg/dL   Comment 1 Notify RN   Glucose, capillary     Status: None   Collection Time: 02/20/18 10:21 PM  Result Value Ref  Range   Glucose-Capillary 83 70 - 99 mg/dL  Glucose, capillary     Status: Abnormal   Collection Time: 02/21/18 12:50 AM  Result Value Ref Range   Glucose-Capillary 69 (L) 70 - 99 mg/dL  Glucose, capillary     Status: Abnormal   Collection Time: 02/21/18  1:28 AM  Result Value Ref Range   Glucose-Capillary 103 (H) 70 - 99 mg/dL  Glucose, capillary     Status: None   Collection Time: 02/21/18  6:36 AM  Result Value Ref Range   Glucose-Capillary 72 70 - 99 mg/dL  Protime-INR     Status: Abnormal   Collection Time: 02/21/18  6:38 AM  Result Value Ref Range   Prothrombin Time 25.0 (H) 11.4 - 15.2 seconds   INR 2.29     Comment: Performed at [facility], [place] [address], [place], [place] [address]  Basic metabolic panel     Status: None   Collection Time: 02/21/18  6:38 AM  Result Value Ref Range   Sodium 135 135 - 145 mmol/L   Potassium 4.4 3.5 - 5.1 mmol/L   Chloride 103 98 - 111 mmol/L   CO2 24 22 - 32 mmol/L   Glucose, Bld 88 70 - 99 mg/dL  BUN 14 8 - 23 mg/dL   Creatinine, Ser 0.96 0.61 - 1.24 mg/dL   Calcium 8.9 8.9 - 10.3 mg/dL   GFR calc non Af Amer >60 >60 mL/min   GFR calc Af Amer >60 >60 mL/min    Comment: (NOTE) The eGFR has been calculated using the CKD EPI equation. This calculation has not been validated in all clinical situations. eGFR's persistently <60 mL/min signify possible Chronic Kidney Disease.    Anion gap 8 5 - 15    Comment: Performed at [facility] [address] [place], [place] [address]  CBC     Status: Abnormal   Collection Time: 02/21/18  6:38 AM  Result Value Ref Range   WBC 6.4 4.0 - 10.5 K/uL   RBC 3.46 (L) 4.22 - 5.81 MIL/uL   Hemoglobin 10.4 (L) 13.0 - 17.0 g/dL   HCT 31.4 (L) 39.0 - 52.0 %   MCV 90.8 78.0 - 100.0 fL   MCH 30.1 26.0 - 34.0 pg   MCHC 33.1 30.0 - 36.0 g/dL   RDW 13.4 11.5 - 15.5 %   Platelets 263 150 - 400 K/uL    Comment: Performed at [facility], [place] [address] [place], [place] [address]   Glucose, capillary     Status: Abnormal   Collection Time: 02/21/18  8:10 AM  Result Value Ref Range   Glucose-Capillary 132 (H) 70 - 99 mg/dL  Glucose, capillary     Status: Abnormal   Collection Time: 02/21/18 11:40 AM  Result Value Ref Range   Glucose-Capillary 155 (H) 70 - 99 mg/dL  Glucose, capillary     Status: Abnormal   Collection Time: 02/21/18  3:42 PM  Result Value Ref Range   Glucose-Capillary 102 (H) 70 - 99 mg/dL  Glucose, capillary     Status: None   Collection Time: 02/21/18  4:34 PM  Result Value Ref Range   Glucose-Capillary 99 70 - 99 mg/dL  Glucose, capillary     Status: Abnormal   Collection Time: 02/21/18 10:04 PM  Result Value Ref Range   Glucose-Capillary 50 (L) 70 - 99 mg/dL  Glucose, capillary     Status: None   Collection Time: 02/21/18 11:02 PM  Result Value Ref Range   Glucose-Capillary 74 70 - 99 mg/dL  Glucose, capillary     Status: Abnormal   Collection Time: 02/22/18  4:31 AM  Result Value Ref Range   Glucose-Capillary 64 (L) 70 - 99 mg/dL  Protime-INR     Status: Abnormal   Collection Time: 02/22/18  5:04 AM  Result Value Ref Range   Prothrombin Time 24.0 (H) 11.4 - 15.2 seconds   INR 2.18     Comment: Performed at [facility], [place] [address], [place], [place] [address]  Glucose, capillary     Status: None   Collection Time: 02/22/18  5:17 AM  Result Value Ref Range   Glucose-Capillary 80 70 - 99 mg/dL  Glucose, capillary     Status: None   Collection Time: 02/22/18  6:28 AM  Result Value Ref Range   Glucose-Capillary 80 70 - 99 mg/dL     Constitutional: No distress . Vital signs reviewed. HENT: Normocephalic.  Atraumatic. Eyes: EOMI. No discharge. Cardiovascular: RRR. No JVD. Respiratory: CTA bilaterally. Normal effort. GI: BS +. Non-distended. Musc: No edema or tenderness in extremities. Neuro: Alert and Oriented Motor:  Left upper extremity: 4+5/5 proximal distal (stable) Left lower extremity: 4+/5 proximal  distal  Skin:   Intact. Warm and dry.  Assessment/Plan: 1. Functional deficits secondary to RIght MCA infarct  which require 3+ hours per day of interdisciplinary therapy in a comprehensive inpatient rehab setting. Physiatrist is providing close team supervision and 24 hour management of active medical problems listed below. Physiatrist and rehab team continue to assess barriers to discharge/monitor patient progress toward functional and medical goals. FIM: Function - Bathing Position: Shower Body parts bathed by patient: Right arm, Left arm, Chest, Abdomen, Right upper leg, Left upper leg, Right lower leg, Left lower leg, Front perineal area, Buttocks, Back Body parts bathed by helper: Buttocks, Back Bathing not applicable: Front perineal area, Buttocks, Right upper leg, Left upper leg, Right lower leg, Left lower leg(Declined LB bathing) Assist Level: Touching or steadying assistance(Pt > 75%)  Function- Upper Body Dressing/Undressing What is the patient wearing?: Pull over shirt/dress Pull over shirt/dress - Perfomed by patient: Thread/unthread right sleeve, Thread/unthread left sleeve, Pull shirt over trunk, Put head through opening Pull over shirt/dress - Perfomed by helper: Put head through opening, Pull shirt over trunk Assist Level: Supervision or verbal cues Set up : To obtain clothing/put away Function - Lower Body Dressing/Undressing What is the patient wearing?: Pants, Shoes Position: Wheelchair/chair at sink Pants- Performed by patient: Thread/unthread right pants leg, Thread/unthread left pants leg, Pull pants up/down Pants- Performed by helper: Thread/unthread right pants leg, Thread/unthread left pants leg, Pull pants up/down Non-skid slipper socks- Performed by patient: Don/doff right sock, Don/doff left sock Non-skid slipper socks- Performed by helper: Don/doff right sock, Don/doff left sock Shoes - Performed by patient: Don/doff right shoe, Don/doff left shoe, Fasten  right, Fasten left Shoes - Performed by helper: Fasten right, Fasten left TED Hose - Performed by helper: Don/doff right TED hose, Don/doff left TED hose Assist for footwear: Supervision/touching assist Assist for lower body dressing: Touching or steadying assistance (Pt > 75%)  Function - Toileting Toileting activity did not occur: No continent bowel/bladder event Toileting steps completed by patient: Adjust clothing prior to toileting, Performs perineal hygiene, Adjust clothing after toileting Toileting steps completed by helper: Adjust clothing prior to toileting, Performs perineal hygiene, Adjust clothing after toileting Toileting Assistive Devices: Other (comment)(foley cath) Assist level: Touching or steadying assistance (Pt.75%)  Function - Toilet Transfers Toilet transfer activity did not occur: Refused Toilet transfer assistive device: Elevated toilet seat/BSC over toilet, Walker Assist level to toilet: Touching or steadying assistance (Pt > 75%) Assist level from toilet: Touching or steadying assistance (Pt > 75%) Assist level to bedside commode (at bedside): Touching or steadying assistance (Pt > 75%) Assist level from bedside commode (at bedside): Touching or steadying assistance (Pt > 75%)  Function - Chair/bed transfer Chair/bed transfer method: Stand pivot Chair/bed transfer assist level: Touching or steadying assistance (Pt > 75%) Chair/bed transfer assistive device: Walker Chair/bed transfer details: Verbal cues for sequencing, Verbal cues for technique, Verbal cues for precautions/safety, Manual facilitation for weight shifting, Manual facilitation for placement  Function - Locomotion: Wheelchair Will patient use wheelchair at discharge?: No Type: Manual Max wheelchair distance: 50 Assist Level: Supervision or verbal cues Assist Level: Supervision or verbal cues Function - Locomotion: Ambulation Ambulation activity did not occur: Safety/medical concerns Assistive  device: Walker-rolling Max distance: 50 Assist level: Touching or steadying assistance (Pt > 75%) Assist level: Touching or steadying assistance (Pt > 75%) Assist level: Touching or steadying assistance (Pt > 75%)  Function - Comprehension Comprehension: Auditory Comprehension assist level: Follows basic conversation/direction with extra time/assistive device  Function -  Expression Expression: Verbal Expression assist level: Expresses complex ideas: With extra time/assistive device  Function - Social Interaction Social Interaction assist level: Interacts appropriately 75 - 89% of the time - Needs redirection for appropriate language or to initiate interaction.  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 DVT Prophylaxis/Anticoagulation: Pharmaceutical:Coumadin per pharm protocol   INR therapeutic on 8/28 3. Pain Management:prn tylenol. Oxycodone or tramadol for more severe pain 4. Mood:LCSW to follow for evaluation and support. 5. Neuropsych: This patientis fullycapable of making decisions on hisown behalf. 6. Skin/Wound Care:Added protein supplements between meals 7. Fluids/Electrolytes/Nutrition:Strict I/O. Intake poor due to recent teeth extraction.   BMP within normal range on 8/27  Discussed importance of adhering to recommended diet with patient 8. CAD s/p CABG with MVR: Monitor for symptoms with activity. On coumadin and Lipitor--coreg added  9.PAF: Cont warfarin,  coreg  10. R-MCA stroke with extension: Left sided weakness resolving. On coumadin.  11. Chronic systolic CHF: Encourage IS. Monitor weights daily with strict I/O. Cont meds Filed Weights   02/20/18 0444 02/21/18 0416 02/22/18 0500  Weight: 84.7 kg 81.8 kg 82.3 kg   ? Reliability on 8/28 12. H/o BPH/Acute Urinary retention:   Cont foley at present per urology until outpatient follow up, urology signed off 13. Constipation: Increased mirlax to bid 14. ABLA/ Continue to monitor H/H for recovery.    Hemoglobin 10.4 on 8/27  Continue to monitor 15. Hyponatremia: Continue to monitor for now.   Sodium 136 on 8/24  Continue to monitor 16. T2DM: Monitor BS ac/hs.  Levemir daily d/ced on 8/21  Glipizide started on 8/21, decreased to 2.5 mg on 8/27, DC'd on 8/28  SSI for tighter BS control.  Repaglinide d/ced on 8/25 CBG (last 3)  Recent Labs    02/22/18 0431 02/22/18 0517 02/22/18 0628  GLUCAP 64* 80 80   Labile with hypoglycemia on 8/28  A FACE TO FACE EVALUATION WAS PERFORMED Ankit Lorie Phenix 02/22/2018, 8:51 AM

## 2018-02-22 NOTE — Progress Notes (Signed)
Speech Language Pathology Daily Session Note  Patient Details  Name: Alvira PhilipsRandleman D Cimo Jr. MRN: [id number] Date of Birth: 04-08-1946  Today's Date: 02/22/2018 SLP Individual Time: 1000-1055 SLP Individual Time Calculation (min): 55 min  Short Term Goals: Week 3: SLP Short Term Goal 1 (Week 3): STG=LTG due to remaining length of stay  Skilled Therapeutic Interventions: Skilled treatment session focused on cognitive goals. SLP facilitated session with a novel memory task. Patient utilized the memory compensatory strategy of association and note taking to maximize recall of unfamiliar information after a ~15 minute delay and was able to recall 10/12 items independently. Patient also recalled events from previous therapy sessions with Mod I. Patient reports he feels his memory with use of strategies. Patient left upright in wheelchair with alarm on and all needs within reach. Continue with current plan of care.      Function:   Cognition Comprehension Comprehension assist level: Follows basic conversation/direction with extra time/assistive device  Expression   Expression assist level: Expresses complex ideas: With extra time/assistive device  Social Interaction Social Interaction assist level: Interacts appropriately 90% of the time - Needs monitoring or encouragement for participation or interaction.  Problem Solving Problem solving assist level: Solves basic problems with no assist  Memory Memory assist level: Recognizes or recalls 75 - 89% of the time/requires cueing 10 - 24% of the time    Pain Pain Assessment Pain Scale: 0-10 Pain Score: 0-No pain  Therapy/Group: Individual Therapy  Seraphina Mitchner 02/22/2018, 12:51 PM

## 2018-02-22 NOTE — Progress Notes (Signed)
Physical Therapy Session Note  Patient Details  Name: Nathan [name] D Toon Jr. MRN: [id number] Date of Birth: March 13, 1946  Today's Date: 02/22/2018 PT Individual Time: 1330-1445 PT Individual Time Calculation (min): 75 min   Short Term Goals:  Week 3:  PT Short Term Goal 1 (Week 3): =LTG due to estimated LOS  Skilled Therapeutic Interventions/Progress Updates:   Pt resting in bed; he is concerned at the amount of blood in his urine.  Urine in Foley bag noted to be very dark  With hematuria.  PT consulted Chelsea, RN who stated Pam, PA is aware, and pt is to continue txs.  W/c propulsion using bil LEs to propel on level tile x 50' before c/o hamstring muscle fatigue.  Attempted gait training with RW; pt stated his LEs felt too weak; LLE dragging, unsafe as RW moved too far ahead of pt.  In sitting, see vitals below.  PT consulted Chelsea RN and [place] regarding pt's status.  He has c/o weakness today.    Supine neuromuscular re-education via forced use, multimodal cues for 2 x 10 bil bridging, bil lower trunk rotation, 5 x 1 L uniltateral bridging; 10 x 2 L hip flexion with LLE hanging off of edge of mat, foot on floor; L/R sidelying 15 x 1 R/L clam shells for hip abduction; seated 15 x 1 bil heel raises, bil hip adduction.  After sitting exs, pt c/o feeling "really weak", and leaning forward unsafely.   PT assisted pt to supine quickly, and elevated bil LEs on footstool.  See vitals.   After resting in supine several minutes, pt still c/o feeling weak.  Squat pivot mat> w/c.  Stand pivot transfer to return to bed.  Pt left resting in side lying with needs at hand and alarm set.  PT informed Chelsea, RN of pt's status.     Therapy Documentation Precautions:  Precautions Precautions: Fall, Sternal Restrictions Weight Bearing Restrictions: No Other Position/Activity Restrictions: sternal precautions   Vital Signs: 88/60 BP in sitting;  HR 80.  Therapy Vitals Temp: 97.9 F (36.6  C) Temp Source: Oral Pulse Rate: 76 Resp: 17 BP: 94/65 Patient Position (if appropriate): Lying(LEs elevated) Oxygen Therapy SpO2: 98 % O2 Device: Room Air Pain: none      See Function Navigator for Current Functional Status.   Therapy/Group: Individual Therapy  Shawnee Gambone 02/22/2018, 3:32 PM

## 2018-02-22 NOTE — Progress Notes (Signed)
Recreational Therapy Discharge Summary Patient Details  Name: Nathan [name]. MRN: [id number] Date of Birth: 06-11-1946 Today's Date: 02/22/2018   Comments on progress toward goals: Pt has made good progress toward goal and is scheduled for discharge to SNF for continued therapies.  TR session focused on activity analysis with potential modifications, community pursuits, energy conservation, safety awareness, & dynamic standing balance.  Goal met. Reasons for discharge: treatment goals met Patient/family agrees with progress made and goals achieved: Yes  Georgana Romain 02/22/2018, 3:44 PM

## 2018-02-22 NOTE — Patient Care Conference (Signed)
Inpatient RehabilitationTeam Conference and Plan of Care Update Date: 02/22/2018   Time: 2:40 PM    Patient Name: Nathan [name].      Medical Record Number: [id number]  Date of Birth: 1946/02/11 Sex: Male         Room/Bed: 4M10C/4M10C-01 Payor Info: Payor: Multimedia programmer / Plan: [employer] MEDICARE / Product Type: *No Product type* /    Admitting Diagnosis: Debility  Admit Date/Time:  02/03/2018  6:17 PM Admission Comments: No comment available   Primary Diagnosis:  <principal problem not specified> Principal Problem: <principal problem not specified>  Patient Active Problem List   Diagnosis Date Noted  . Hypoglycemia   . History of CVA with residual deficit   . Labile blood glucose   . Benign prostatic hyperplasia with urinary retention   . History of CVA (cerebrovascular accident) without residual deficits   . PAF (paroxysmal atrial fibrillation) (HCC)   . Anemia of chronic disease   . Debility 02/03/2018  . 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 Pressure injury of skin 02/02/2018  . Malnutrition of moderate degree 02/01/2018  . Coronary artery disease involving native coronary artery of native heart with angina pectoris (HCC)   . S/P CABG (coronary artery bypass graft)   . Atrial fibrillation (HCC)   . Chronic combined systolic and diastolic CHF (congestive heart failure) (HCC)   . Diabetes mellitus type 2 in nonobese (HCC)   . Essential hypertension   . History of CVA (cerebrovascular accident)   . Acute blood loss anemia   . Hyponatremia   . S/P left atrial appendage ligation 01/26/2018  . S/P CABG x 4 01/23/2018  . S/P MVR (mitral valve replacement) 01/23/2018  . Mitral valve insufficiency   . On amiodarone therapy 07/28/2017  . CAD in native artery 07/28/2017  . Chronic anticoagulation 05/18/2017  . LBBB (left bundle branch block) 05/18/2017  . Hospital discharge follow-up 05/09/2017  . Paroxysmal atrial  fibrillation (HCC) 05/01/2017  . Dilated cardiomyopathy (HCC) 05/01/2017  . DM (diabetes mellitus) type 2, uncontrolled, with ketoacidosis (HCC) 05/01/2017  . Hypotension 05/01/2017  . HLD (hyperlipidemia) 05/01/2017  . Cerebrovascular accident (CVA) due to embolism of right middle cerebral artery (HCC) 04/29/2017  . Hypertensive heart disease with heart failure (HCC) 11/26/2016  . Mixed dyslipidemia 11/26/2016  . Chronic systolic congestive heart failure (HCC) 04/24/2015  . IVCD (intraventricular conduction defect) 04/24/2015    Expected Discharge Date: Expected Discharge Date: 02/24/18  Team Members Present: Physician leading conference: Dr. 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) and deconditioning after CABG/MVR  Improve mobility, CBGs, urinary retention  See above   Bowel/Bladder   Foley catheter for urinary retentin continent of bowel LBM 08/27  remain continent of bowel,  with normal bowel pattern continue catheter for urinary retention  assess b/b needs qshift and prn laxatives as ordered and prn   Swallow/Nutrition/ Hydration             ADL's   CGA functional transfers pending fatigue; supervision UB bathing/dressing; CGA LB dressing  Supervision   ADL re-training, functional transfers, functional activity tolerance and balance    Mobility  minA overall  minA overall including gait x50'  L NMR, dynamic balance, activity tolerance, LE strengthening   Communication             Safety/Cognition/ Behavioral Observations  Min A  Min A-Supervision  complex problem solving, error awareness, recall with use of strategies and attention    Pain   Pt denies any pain   pt  will be free of pain  assess pain qshift and prn   Skin   sternal incision healed, steri strips to right upper quad MASD to inner buttock   no s/sx of infection improvement of MASD no breakdown   assess skin qshift and prn      *See Care Plan and progress notes for long and short-term goals.     Barriers to Discharge  Current Status/Progress Possible Resolutions Date Resolved   Physician    Medical stability;Other (comments)  Urinary retention, hypoglycemia  See above  Therapies, follow CBGs- meds d/ced, Indwelling foley per Urology outpt follow up      Nursing                  PT                    OT                  SLP                SW                Discharge Planning/Teaching Needs:  Plan has changed to [facility] to get more therapies before going home with wife and son. Looking for bed.      Team Discussion:  Making progress in therapies toward min-supervision level goals. Still requiring cueing and numerous rest breaks. Memory getting better. MD still working on his BS issues-has taken off all BS meds, see if will even out. Incisions healing. Foley draining MD checking UA due to dark colored urine. Bed offer via [facility] in [place] working on insurance apporval  Revisions to Treatment Plan:  [facility] bed for Friday 8/30 once insurance approval    Continued Need for Acute Rehabilitation Level of Care: The patient requires daily medical management by a physician with specialized training in physical medicine and rehabilitation for the following conditions: Daily direction of a multidisciplinary physical rehabilitation program to ensure safe treatment while eliciting the highest outcome that is of practical value to the patient.: Yes Daily medical management of patient stability for increased activity during participation in an intensive rehabilitation regime.: Yes Daily analysis of laboratory values and/or radiology reports with any subsequent need for medication adjustment of medical  intervention for : Urological problems;Cardiac problems   I attest that I was present, lead the team conference, and concur with the assessment and plan of the team.   [name], Patches Mcdonnell G 02/22/2018, 3:59 PM

## 2018-02-23 ENCOUNTER — Inpatient Hospital Stay (HOSPITAL_COMMUNITY): Payer: Self-pay | Admitting: Speech Pathology

## 2018-02-23 ENCOUNTER — Encounter (HOSPITAL_COMMUNITY): Payer: Self-pay | Admitting: Physical Medicine and Rehabilitation

## 2018-02-23 ENCOUNTER — Inpatient Hospital Stay (HOSPITAL_COMMUNITY): Payer: Self-pay | Admitting: Physical Therapy

## 2018-02-23 ENCOUNTER — Inpatient Hospital Stay (HOSPITAL_COMMUNITY): Payer: Self-pay | Admitting: Occupational Therapy

## 2018-02-23 DIAGNOSIS — N390 Urinary tract infection, site not specified: Secondary | ICD-10-CM

## 2018-02-23 DIAGNOSIS — R338 Other retention of urine: Secondary | ICD-10-CM

## 2018-02-23 DIAGNOSIS — A499 Bacterial infection, unspecified: Secondary | ICD-10-CM | POA: Insufficient documentation

## 2018-02-23 DIAGNOSIS — R319 Hematuria, unspecified: Secondary | ICD-10-CM

## 2018-02-23 DIAGNOSIS — N9989 Other postprocedural complications and disorders of genitourinary system: Secondary | ICD-10-CM

## 2018-02-23 DIAGNOSIS — R8290 Unspecified abnormal findings in urine: Secondary | ICD-10-CM

## 2018-02-23 HISTORY — DX: Other retention of urine: R33.8

## 2018-02-23 HISTORY — DX: Hematuria, unspecified: R31.9

## 2018-02-23 HISTORY — DX: Other postprocedural complications and disorders of genitourinary system: N99.89

## 2018-02-23 LAB — PROTIME-INR
INR: 2.3
PROTHROMBIN TIME: 25.1 s — AB (ref 11.4–15.2)

## 2018-02-23 LAB — GLUCOSE, CAPILLARY
Glucose-Capillary: 82 mg/dL (ref 70–99)
Glucose-Capillary: 176 mg/dL — ABNORMAL HIGH (ref 70–99)
Glucose-Capillary: 144 mg/dL — ABNORMAL HIGH (ref 70–99)

## 2018-02-23 MED ORDER — PREMIER PROTEIN SHAKE: 11.0000 [oz_av] | Freq: Three times a day (TID) | ORAL | 0 refills | Status: DC

## 2018-02-23 MED ORDER — POLYETHYLENE GLYCOL 3350 17 G PO PACK: 17.0000 g | PACK | Freq: Two times a day (BID) | ORAL | 0 refills | Status: DC

## 2018-02-23 MED ORDER — GUAIFENESIN ER 600 MG PO TB12: 1200.0000 mg | ORAL_TABLET | Freq: Two times a day (BID) | ORAL | Status: DC | PRN

## 2018-02-23 MED ORDER — BISACODYL 5 MG PO TBEC: 10.0000 mg | DELAYED_RELEASE_TABLET | Freq: Every day | ORAL | 0 refills | Status: DC

## 2018-02-23 MED ORDER — WARFARIN SODIUM 3 MG PO TABS: 3.0000 mg | ORAL_TABLET | Freq: Every day | ORAL | Status: DC

## 2018-02-23 MED ORDER — ROSUVASTATIN CALCIUM 20 MG PO TABS: 20.0000 mg | ORAL_TABLET | Freq: Every day | ORAL | Status: DC

## 2018-02-23 MED ORDER — TRAZODONE HCL 50 MG PO TABS: 25.0000 mg | ORAL_TABLET | Freq: Every evening | ORAL | Status: DC | PRN

## 2018-02-23 MED ORDER — PANTOPRAZOLE SODIUM 40 MG PO TBEC: 40.0000 mg | DELAYED_RELEASE_TABLET | Freq: Every day | ORAL | Status: AC

## 2018-02-23 NOTE — Progress Notes (Signed)
Occupational Therapy Session Note  Patient Details  Name: Nathan PhilipsRandleman D Papania Jr. MRN: [id number] Date of Birth: [birth year]  Today's Date: 02/23/2018 OT Individual Time: 1100-1200 OT Individual Time Calculation (min): 60 min    Short Term Goals: Week 3:  OT Short Term Goal 1 (Week 3): STG=LTG due to LOS  Skilled Therapeutic Interventions/Progress Updates:    Pt seen for OT ADL bathing/dressing session. Pt sitting up in w/c upon arrival, hand off from SLP. Pt agreeable to shower this session as well as denying pain. Throughout session, pt completed functional transfers and ambulation with supervision-min A using RW. He required VCs each trial for standing technique and sequencing in order to maintain sternal pre-cautions and effective techniques.  He completed toileting task with guarding assist cuing required as pt attempting to sit on toilet prior to clothing management. Assist provided for thoroughness following BM. He transitioned into shower and bathed seated on BSC in shower. VCs required for thoroughness and attention to task. He returned to w/c to dress, required cuing for sequencing each step of task. He completed grooming tasks at sink in standing with close supervision, close supervision provided for ambulation as well as pt demonstrating poor path finding and RW management skills despite VCs.  Pt left seated in w/c at end of session, chair belt on, and all needs in reach.  Throughout session, pt required redirection and encouragement for tasks as well as need for constant positive reinforcement of performance.   Therapy Documentation Precautions:  Precautions Precautions: Fall, Sternal Restrictions Weight Bearing Restrictions: No Other Position/Activity Restrictions: sternal precautions Pain: Pain Assessment Pain Scale: 0-10 Pain Score: 0-No pain ADL: ADL ADL Comments: Please see functional navigator  See Function Navigator for Current Functional  Status.   Therapy/Group: Individual Therapy  Jet Traynham L 02/23/2018, 10:53 AM

## 2018-02-23 NOTE — Progress Notes (Signed)
Speech Language Pathology Discharge Summary  Patient Details  Name: Nathan [name]. MRN: [id number] Date of Birth: 09/02/1945  Today's Date: 02/23/2018 SLP Individual Time: 1006-1100; 1500-1520 SLP Individual Time Calculation (min): 54 min; 20 min    Skilled Therapeutic Interventions:   Session 1:  Pt was seen for skilled ST targeting cognitive goals.  Pt was in bed upon arrival and needed supervision cues for use of sternal precautions when transferring from bed to wheelchair.   Pt requested to wash his face, clean his mouth, and brush his hair at the sink.  He was able to complete sinkside ADLs with mod I.  SLP administered the MoCA to measure progress since initial evaluation.  Pt scored 26/30 on assessment (n>/= 26) which seems to verify that pt is nearing his cognitive baseline.   SLP transcribed daily events into pt's memory notebook as dictated by pt with supervision verbal cues.  Pt was left in wheelchair with call bell within reach.  Continue per current plan of care.    Session 2:  Pt was seen for skilled ST targeting cognitive goals.  Pt was in bed upon arrival but was awake, alert, and agreeable to participating in therapy.  Pt could recall specific details about previous therapy sessions with supervision question cues and information had been recorded into memory notebook.  During functional conversations about discharge planning and personal goal setting for next level of care, pt needed mod cues for redirection to topic at hand due to tangents.  Pt was eventually able to set one goal for himself in preparation for eventual discharge home with min question cues.  Pt was left in bed with bed alarm set and call bell within reach.      Patient has met 4 of 4 long term goals.  Patient to discharge at overall Supervision level.  Reasons goals not met:     Clinical Impression/Discharge Summary:   Pt has made functional gains while inpatient and is discharging having met 4 out of 4  short term goals.  Pt is currently at most supervision level assist for tasks due to mild memory deficits which are suspected to be at baseline.  No family has been present to verify.  Pt education is complete for this level of care but pt's wife would benefit from skilled education at SNF prior to discharge from facility as she has not attended any therapy sessions while pt was hospitalized.   Pt has demonstrated improved use of memory strategies and carryover of daily information.  Pt is discharging to SNF with recommendations for ST follow up at next level of care.  Care Partner:  Caregiver Able to Provide Assistance: Other (comment)(SNF)  Type of Caregiver Assistance: (SNF)  Recommendation:  24 hour supervision/assistance;Skilled Nursing facility  Rationale for SLP Follow Up: Maximize cognitive function and independence   Equipment: none recommended by SLP    Reasons for discharge: Discharged from hospital   Patient/Family Agrees with Progress Made and Goals Achieved: Yes   Function:  Eating Eating                 Cognition Comprehension Comprehension assist level: Follows basic conversation/direction with extra time/assistive device  Expression   Expression assist level: Expresses basic needs/ideas: With extra time/assistive device  Social Interaction Social Interaction assist level: Interacts appropriately with others with medication or extra time (anti-anxiety, antidepressant).  Problem Solving Problem solving assist level: Solves basic 90% of the time/requires cueing < 10% of the time  Memory  Memory assist level: Recognizes or recalls 75 - 89% of the time/requires cueing 10 - 24% of the time   Windell Moulding L 02/23/2018, 10:56 AM

## 2018-02-23 NOTE — Progress Notes (Signed)
Social Work Patient ID: Nathan Philipsandleman D Wolaver Jr., male   DOB: Feb 09, 1946, 72 y.o.   MRN: [id number]  Awaiting insurance approval for pt to go to [facility] tomorrow. Have spoke with [facility] she will let this worker know when she receives approval.

## 2018-02-23 NOTE — Progress Notes (Signed)
Physical Therapy Discharge Summary  Patient Details  Name: Nathan [name]. MRN: [id number] Date of Birth: [birth year]  Today's Date: 02/23/2018 PT Individual Time: 1300-1400 PT Individual Time Calculation (min): 60 min    Patient has met 6 of 6 long term goals due to improved activity tolerance, improved balance, improved postural control, increased strength, ability to compensate for deficits, functional use of  left upper extremity and left lower extremity, improved attention and improved awareness.  Patient to discharge at an ambulatory level [place].   Patient's care partner unavailable to provide the necessary physical and cognitive assistance at discharge.  Reasons goals not met: All goals met  Recommendation:  Patient will benefit from ongoing skilled PT services in skilled nursing facility setting to continue to advance safe functional mobility, address ongoing impairments in strength, balance, coordination, activity tolerance, and minimize fall risk.  Equipment: No equipment provided  Reasons for discharge: treatment goals met and discharge from hospital  Patient/family agrees with progress made and goals achieved: Yes  PT Discharge Precautions/Restrictions Precautions Precautions: Fall;Sternal Restrictions Weight Bearing Restrictions: Yes Other Position/Activity Restrictions: sternal precautions Pain Pain Assessment Pain Scale: 0-10 Pain Score: 0-No pain Vision/Perception  Perception Perception: Within Functional Limits Praxis Praxis: Intact  Cognition Overall Cognitive Status: History of cognitive impairments - at baseline Arousal/Alertness: Awake/alert Orientation Level: Oriented X4 Attention: Focused Focused Attention: Appears intact Sustained Attention: Impaired Selective Attention: Impaired Selective Attention Impairment: Verbal basic;Functional basic Memory: Impaired Memory Impairment: Decreased short term memory Awareness: Appears  intact Awareness Impairment: Anticipatory impairment Problem Solving: Impaired Problem Solving Impairment: Functional complex Executive Function: Organizing;Self Monitoring;Self Correcting Organizing: Impaired Organizing Impairment: Functional complex Self Monitoring: Impaired Self Monitoring Impairment: Functional complex;Verbal complex Self Correcting: Impaired Self Correcting Impairment: Verbal complex;Functional complex Behaviors: Impulsive;Perseveration Safety/Judgment: Impaired Sensation Sensation Light Touch: Appears Intact Proprioception: Appears Intact Coordination Gross Motor Movements are Fluid and Coordinated: Yes Fine Motor Movements are Fluid and Coordinated: Yes Heel Shin Test: impaired L >R Motor  Motor Motor: Hemiplegia Motor - Discharge Observations: Improving L hemiparesis  Mobility Bed Mobility Bed Mobility: Supine to Sit;Sit to Supine Supine to Sit: Supervision/Verbal cueing Sit to Supine: Supervision/Verbal cueing Transfers Transfers: Sit to Stand;Stand to Sit;Stand Pivot Transfers Sit to Stand: Contact Guard/Touching assist Stand to Sit: Contact Guard/Touching assist Stand Pivot Transfers: Contact Guard/Touching assist Stand Pivot Transfer Details: Verbal cues for technique;Verbal cues for precautions/safety;Manual facilitation for weight shifting Transfer (Assistive device): None Locomotion  Gait Ambulation: Yes Gait Assistance: Contact Guard/Touching assist Gait Distance (Feet): 75 Feet Assistive device: Rolling walker Gait Gait: Yes Gait Pattern: Impaired Gait Pattern: Poor foot clearance - left;Narrow base of support;Lateral trunk lean to left;Lateral hip instability;Decreased trunk rotation;Shuffle;Left flexed knee in stance;Decreased stride length Wheelchair Mobility Wheelchair Mobility: Yes Wheelchair Assistance: Chartered loss adjuster: Both lower extermities Wheelchair Parts Management: Needs  assistance Distance: 23'  Trunk/Postural Assessment  Cervical Assessment Cervical Assessment: Within Functional Limits Thoracic Assessment Thoracic Assessment: Within Functional Limits Lumbar Assessment Lumbar Assessment: Within Functional Limits Postural Control Postural Control: Within Functional Limits  Balance Balance Balance Assessed: Yes Standardized Balance Assessment Standardized Balance Assessment: Timed Up and Go Test Timed Up and Go Test TUG: Normal TUG Normal TUG (seconds): 26 Static Sitting Balance Static Sitting - Balance Support: Feet supported Static Sitting - Level of Assistance: 5: Stand by assistance Dynamic Sitting Balance Dynamic Sitting - Balance Support: Feet supported Dynamic Sitting - Level of Assistance: 5: Stand by assistance Static Standing Balance Static Standing - Balance Support: During functional activity Static  Standing - Level of Assistance: 5: Stand by assistance;4: Min assist Dynamic Standing Balance Dynamic Standing - Balance Support: During functional activity;Left upper extremity supported;Right upper extremity supported Dynamic Standing - Level of Assistance: 4: Min assist Extremity Assessment  RUE Assessment RUE Assessment: Within Functional Limits General Strength Comments: 4/5: elbow flexion and extension  LUE Assessment LUE Assessment: Exceptions to [facility] General Strength Comments: 4/5: elbow flexion and extension RLE Assessment RLE Assessment: Within Functional Limits General Strength Comments: 4+ to 5/5 throughout LLE Assessment LLE Assessment: Exceptions to [facility] General Strength Comments: see below LLE Strength Left Hip Flexion: 4/5 Left Knee Flexion: 4+/5 Left Knee Extension: 4+/5 Left Ankle Dorsiflexion: 4+/5  Skilled Therapeutic Intervention: Pt received seated in w/c finishing lunch, denies pain and agreeable to treatment. Assessed mobility as above with minA overall d/t LE strength deficits, residual L hemiparesis,  decreased aerobic endurance. Pt with no further questions/concerns regarding d/c home at this time. Remained in bed at end of session, alarm intact, all needs in reach.    See Function Navigator for Current Functional Status.  Benjiman Core Jamera Vanloan 02/23/2018, 1:09 PM

## 2018-02-23 NOTE — Progress Notes (Signed)
Occupational Therapy Discharge Summary  Patient Details  Name: Nathan [name]. MRN: [id number] Date of Birth: 1945-12-23  Today's Date: 02/23/2018  Patient has met 9 of 9 long term goals due to improved activity tolerance, improved balance, ability to compensate for deficits and functional use of  LEFT upper and LEFT lower extremity.  Patient to discharge at overall close Supervision - min A level, perofrmance varying based on fatigue, pain, perseveration, and attention to task.  Patient's care partner unavailable to provide the necessary physical and cognitive assistance at discharge due to wife working full time as a Pharmacist, hospital.  Pt discharging to SNF in order to maximize independence prior to returning home with spouse. Pt is close supervision - min A for all functional transfers, functional ambulation and self care skills depending on fatigue. Pt has shown progress in strength, activity tolerance, balance and increased independence in ADL's however maximum rehab potential has not been reached at this time therefore skilled therapy services at SNF would be beneficial. Pt requires max verbal cueing for redirection during tasks due to internal distraction, attention and short term memory. Pt has participated in education regarding adherence to sternal precautions during functional transfers and self care tasks, energy conservation, and breath support strategies due to increased anxiety during functional ambulation and functional tasks.   Recommendation:  Patient will benefit from ongoing skilled OT services in skilled nursing facility setting to continue to advance functional skills in the area of BADL, iADL and Reduce care partner burden.  Equipment: No equipment provided  Reasons for discharge: discharge from hospital  Patient/family agrees with progress made and goals achieved: Yes  OT Discharge Precautions/Restrictions  Precautions Precautions: Fall;Sternal Restrictions Weight Bearing  Restrictions: Yes Other Position/Activity Restrictions: sternal precautions ADL ADL ADL Comments: Please see functional navigator Vision Wears Glasses: Reading only Patient Visual Report: No change from baseline Vision Assessment?: No apparent visual deficits Perception  Perception: Within Functional Limits Praxis Praxis: Intact Cognition Overall Cognitive Status: History of cognitive impairments - at baseline Arousal/Alertness: Awake/alert Orientation Level: Oriented X4 Attention: Focused Focused Attention: Appears intact Memory: Impaired Memory Impairment: Decreased short term memory Awareness: Appears intact Awareness Impairment: Anticipatory impairment Problem Solving: Impaired Problem Solving Impairment: Functional complex Executive Function: Organizing;Self Monitoring;Self Correcting Organizing: Impaired Organizing Impairment: Functional complex Self Monitoring: Impaired Self Monitoring Impairment: Functional complex;Verbal complex Self Correcting: Impaired Self Correcting Impairment: Verbal complex;Functional complex Behaviors: Perseveration Safety/Judgment: Impaired Sensation Sensation Light Touch: Appears Intact Proprioception: Appears Intact Coordination Gross Motor Movements are Fluid and Coordinated: No Fine Motor Movements are Fluid and Coordinated: Yes Heel Shin Test: impaired L >R Motor  Motor Motor: Hemiplegia Motor - Discharge Observations: Pt has shown improvements in L UE/LE motor movements however still has residual deficits  Trunk/Postural Assessment  Cervical Assessment Cervical Assessment: Within Functional Limits Thoracic Assessment Thoracic Assessment: Within Functional Limits Lumbar Assessment Lumbar Assessment: Within Functional Limits Postural Control Postural Control: Within Functional Limits  Balance Balance Balance Assessed: Yes Standardized Balance Assessment Standardized Balance Assessment: Timed Up and Go Test Timed Up and  Go Test TUG: Normal TUG Normal TUG (seconds): 26 Static Sitting Balance Static Sitting - Balance Support: Feet supported Static Sitting - Level of Assistance: 5: Stand by assistance Dynamic Sitting Balance Dynamic Sitting - Balance Support: Feet supported Dynamic Sitting - Level of Assistance: 5: Stand by assistance Static Standing Balance Static Standing - Balance Support: During functional activity Static Standing - Level of Assistance: 5: Stand by assistance;4: Min assist Dynamic Standing Balance Dynamic Standing - Balance  Support: During functional activity;Left upper extremity supported;Right upper extremity supported Dynamic Standing - Level of Assistance: 4: Min assist;5: Stand by assistance Extremity/Trunk Assessment RUE Assessment RUE Assessment: Within Functional Limits General Strength Comments: 4/5 LUE Assessment LUE Assessment: Exceptions to [facility] General Strength Comments: 4-/5 shoulder, elbow    See Function Navigator for Current Functional Status.  Chloe Busbee 02/23/2018, 8:52 AM

## 2018-02-23 NOTE — Progress Notes (Signed)
Subjective/Complaints: Patient seen lying in bed this morning. He states he slept well overnight. He is looking forward to discharge tomorrow. He notes improvement in CBGs.  ROS: denies CP, SOB, N/V/D  Objective: Vital Signs: Blood pressure 100/70, pulse 73, temperature 98.1 F (36.7 C), temperature source Oral, resp. rate 17, height 5' 11.5" (1.816 m), weight 83.5 kg, SpO2 99 %. No results found. Results for orders placed or performed during the hospital encounter of 02/03/18 (from the past 72 hour(s))  Glucose, capillary     Status: Abnormal   Collection Time: 02/20/18 11:50 AM  Result Value Ref Range   Glucose-Capillary 164 (H) 70 - 99 mg/dL  Glucose, capillary     Status: Abnormal   Collection Time: 02/20/18  4:45 PM  Result Value Ref Range   Glucose-Capillary 107 (H) 70 - 99 mg/dL  Glucose, capillary     Status: Abnormal   Collection Time: 02/20/18  9:32 PM  Result Value Ref Range   Glucose-Capillary 57 (L) 70 - 99 mg/dL   Comment 1 Notify RN   Glucose, capillary     Status: None   Collection Time: 02/20/18 10:21 PM  Result Value Ref Range   Glucose-Capillary 83 70 - 99 mg/dL  Glucose, capillary     Status: Abnormal   Collection Time: 02/21/18 12:50 AM  Result Value Ref Range   Glucose-Capillary 69 (L) 70 - 99 mg/dL  Glucose, capillary     Status: Abnormal   Collection Time: 02/21/18  1:28 AM  Result Value Ref Range   Glucose-Capillary 103 (H) 70 - 99 mg/dL  Glucose, capillary     Status: None   Collection Time: 02/21/18  6:36 AM  Result Value Ref Range   Glucose-Capillary 72 70 - 99 mg/dL  Protime-INR     Status: Abnormal   Collection Time: 02/21/18  6:38 AM  Result Value Ref Range   Prothrombin Time 25.0 (H) 11.4 - 15.2 seconds   INR 2.29     Comment: Performed at [facility], [address], [place], [place] [address]  Basic metabolic panel     Status: None   Collection Time: 02/21/18  6:38 AM  Result Value Ref Range   Sodium 135 135 - 145 mmol/L    Potassium 4.4 3.5 - 5.1 mmol/L   Chloride 103 98 - 111 mmol/L   CO2 24 22 - 32 mmol/L   Glucose, Bld 88 70 - 99 mg/dL   BUN 14 8 - 23 mg/dL   Creatinine, Ser 0.96 0.61 - 1.24 mg/dL   Calcium 8.9 8.9 - 10.3 mg/dL   GFR calc non Af Amer >60 >60 mL/min   GFR calc Af Amer >60 >60 mL/min    Comment: (NOTE) The eGFR has been calculated using the CKD EPI equation. This calculation has not been validated in all clinical situations. eGFR's persistently <60 mL/min signify possible Chronic Kidney Disease.    Anion gap 8 5 - 15    Comment: Performed at [facility] [address], [place], [place] [address]  CBC     Status: Abnormal   Collection Time: 02/21/18  6:38 AM  Result Value Ref Range   WBC 6.4 4.0 - 10.5 K/uL   RBC 3.46 (L) 4.22 - 5.81 MIL/uL   Hemoglobin 10.4 (L) 13.0 - 17.0 g/dL   HCT 31.4 (L) 39.0 - 52.0 %   MCV 90.8 78.0 - 100.0 fL   MCH 30.1 26.0 - 34.0 pg   MCHC 33.1  30.0 - 36.0 g/dL   RDW 13.4 11.5 - 15.5 %   Platelets 263 150 - 400 K/uL    Comment: Performed at [facility], [place] [address] [place], [place] [address]  Glucose, capillary     Status: Abnormal   Collection Time: 02/21/18  8:10 AM  Result Value Ref Range   Glucose-Capillary 132 (H) 70 - 99 mg/dL  Glucose, capillary     Status: Abnormal   Collection Time: 02/21/18 11:40 AM  Result Value Ref Range   Glucose-Capillary 155 (H) 70 - 99 mg/dL  Glucose, capillary     Status: Abnormal   Collection Time: 02/21/18  3:42 PM  Result Value Ref Range   Glucose-Capillary 102 (H) 70 - 99 mg/dL  Glucose, capillary     Status: None   Collection Time: 02/21/18  4:34 PM  Result Value Ref Range   Glucose-Capillary 99 70 - 99 mg/dL  Glucose, capillary     Status: Abnormal   Collection Time: 02/21/18 10:04 PM  Result Value Ref Range   Glucose-Capillary 50 (L) 70 - 99 mg/dL  Glucose, capillary     Status: None   Collection Time: 02/21/18 11:02 PM  Result Value Ref Range   Glucose-Capillary 74 70 - 99 mg/dL   Glucose, capillary     Status: Abnormal   Collection Time: 02/22/18  4:31 AM  Result Value Ref Range   Glucose-Capillary 64 (L) 70 - 99 mg/dL  Protime-INR     Status: Abnormal   Collection Time: 02/22/18  5:04 AM  Result Value Ref Range   Prothrombin Time 24.0 (H) 11.4 - 15.2 seconds   INR 2.18     Comment: Performed at [facility], [place] [address], [place], [place] [address]  Glucose, capillary     Status: None   Collection Time: 02/22/18  5:17 AM  Result Value Ref Range   Glucose-Capillary 80 70 - 99 mg/dL  Glucose, capillary     Status: None   Collection Time: 02/22/18  6:28 AM  Result Value Ref Range   Glucose-Capillary 80 70 - 99 mg/dL  Glucose, capillary     Status: Abnormal   Collection Time: 02/22/18 11:39 AM  Result Value Ref Range   Glucose-Capillary 150 (H) 70 - 99 mg/dL  Glucose, capillary     Status: None   Collection Time: 02/22/18  4:32 PM  Result Value Ref Range   Glucose-Capillary 88 70 - 99 mg/dL  Urinalysis, Routine w reflex microscopic     Status: Abnormal   Collection Time: 02/22/18  5:33 PM  Result Value Ref Range   Color, Urine YELLOW YELLOW   APPearance CLEAR CLEAR   Specific Gravity, Urine 1.011 1.005 - 1.030   pH 6.0 5.0 - 8.0   Glucose, UA NEGATIVE NEGATIVE mg/dL   Hgb urine dipstick LARGE (A) NEGATIVE   Bilirubin Urine NEGATIVE NEGATIVE   Ketones, ur NEGATIVE NEGATIVE mg/dL   Protein, ur NEGATIVE NEGATIVE mg/dL   Nitrite NEGATIVE NEGATIVE   Leukocytes, UA LARGE (A) NEGATIVE   RBC / HPF 6-10 0 - 5 RBC/hpf   WBC, UA 21-50 0 - 5 WBC/hpf   Bacteria, UA FEW (A) NONE SEEN   WBC Clumps PRESENT    Mucus PRESENT     Comment: Performed at [facility], [address], [place], [place] [address]  Glucose, capillary     Status: None   Collection Time: 02/22/18  9:04 PM  Result Value Ref Range  Glucose-Capillary 84 70 - 99 mg/dL  Glucose, capillary     Status: None   Collection Time: 02/23/18  6:25 AM  Result Value Ref Range    Glucose-Capillary 82 70 - 99 mg/dL  Protime-INR     Status: Abnormal   Collection Time: 02/23/18  7:19 AM  Result Value Ref Range   Prothrombin Time 25.1 (H) 11.4 - 15.2 seconds   INR 2.30     Comment: Performed at [facility], [place] [address], [place],  [address]     Constitutional: No distress . Vital signs reviewed. HENT: Normocephalic.  Atraumatic. Eyes: EOMI. No discharge. Cardiovascular: RRR. No JVD. Respiratory: CTA bilaterally. Normal effort. GI: BS +. Non-distended. Musc: No edema or tenderness in extremities. Neuro: Alert and Oriented Motor:  Left upper extremity: 4+5/5 proximal distal (unchanged) Left lower extremity: 4+/5 proximal distal (unchanged) Skin:   Intact. Warm and dry.  Assessment/Plan: 1. Functional deficits secondary to RIght MCA infarct  which require 3+ hours per day of interdisciplinary therapy in a comprehensive inpatient rehab setting. Physiatrist is providing close team supervision and 24 hour management of active medical problems listed below. Physiatrist and rehab team continue to assess barriers to discharge/monitor patient progress toward functional and medical goals. FIM: Function - Bathing Position: Shower Body parts bathed by patient: Right arm, Left arm, Chest, Abdomen, Front perineal area, Buttocks, Right upper leg, Left upper leg, Right lower leg, Left lower leg Body parts bathed by helper: Back Bathing not applicable: Front perineal area, Buttocks, Right upper leg, Left upper leg, Right lower leg, Left lower leg(Declined LB bathing) Assist Level: Supervision or verbal cues(close supervision )  Function- Upper Body Dressing/Undressing What is the patient wearing?: Pull over shirt/dress Pull over shirt/dress - Perfomed by patient: Thread/unthread right sleeve, Thread/unthread left sleeve, Put head through opening, Pull shirt over trunk Pull over shirt/dress - Perfomed by helper: Put head through opening, Pull shirt over  trunk Assist Level: Supervision or verbal cues Set up : To obtain clothing/put away Function - Lower Body Dressing/Undressing What is the patient wearing?: Pants, Shoes, Ted Hose Position: Wheelchair/chair at [employer]- Performed by patient: Thread/unthread right pants leg, Thread/unthread left pants leg, Pull pants up/down Pants- Performed by helper: Thread/unthread right pants leg, Thread/unthread left pants leg, Pull pants up/down Non-skid slipper socks- Performed by patient: Don/doff right sock, Don/doff left sock Non-skid slipper socks- Performed by helper: Don/doff right sock, Don/doff left sock Shoes - Performed by patient: Don/doff right shoe, Don/doff left shoe, Fasten right, Fasten left Shoes - Performed by helper: Fasten right, Fasten left TED Hose - Performed by helper: Don/doff right TED hose, Don/doff left TED hose Assist for footwear: Supervision/touching assist Assist for lower body dressing: Supervision or verbal cues  Function - Toileting Toileting activity did not occur: No continent bowel/bladder event Toileting steps completed by patient: Performs perineal hygiene, Adjust clothing after toileting, Adjust clothing prior to toileting Toileting steps completed by helper: Adjust clothing prior to toileting, Performs perineal hygiene, Adjust clothing after toileting Toileting Assistive Devices: Other (comment)(foley cath) Assist level: Touching or steadying assistance (Pt.75%)  Function - Toilet Transfers Toilet transfer activity did not occur: Refused Toilet transfer assistive device: Elevated toilet seat/BSC over toilet, Walker Assist level to toilet: Touching or steadying assistance (Pt > 75%) Assist level from toilet: Touching or steadying assistance (Pt > 75%) Assist level to bedside commode (at bedside): Touching or steadying assistance (Pt > 75%) Assist level from bedside commode (at bedside): Touching or steadying assistance (Pt >  75%)  Function - Chair/bed  transfer Chair/bed transfer method: Stand pivot Chair/bed transfer assist level: Touching or steadying assistance (Pt > 75%) Chair/bed transfer assistive device: Walker Chair/bed transfer details: Verbal cues for sequencing, Verbal cues for technique, Verbal cues for precautions/safety, Manual facilitation for weight shifting, Manual facilitation for placement  Function - Locomotion: Wheelchair Will patient use wheelchair at discharge?: No Type: Manual Max wheelchair distance: 50 Assist Level: Supervision or verbal cues Assist Level: Supervision or verbal cues Turns around,maneuvers to table,bed, and toilet,negotiates 3% grade,maneuvers on rugs and over doorsills: No Function - Locomotion: Ambulation Ambulation activity did not occur: Safety/medical concerns Assistive device: Walker-rolling Max distance: 10 Assist level: Moderate assist (Pt 50 - 74%) Assist level: Moderate assist (Pt 50 - 74%) Assist level: Touching or steadying assistance (Pt > 75%)  Function - Comprehension Comprehension: Auditory Comprehension assist level: Understands basic 90% of the time/cues < 10% of the time  Function - Expression Expression: Verbal Expression assist level: Expresses complex ideas: With extra time/assistive device  Function - Social Interaction Social Interaction assist level: Interacts appropriately 90% of the time - Needs monitoring or encouragement for participation or interaction.  Function - Problem Solving Problem solving assist level: Solves basic problems with no assist  Function - Memory Memory assist level: Recognizes or recalls 75 - 89% of the time/requires cueing 10 - 24% of the time Patient normally able to recall (first 3 days only): Current season, Location of own room, Staff names and faces, That he or she is in a hospital    Medical Problem List and Plan: 1.Debility and LEFT  hemiparesissecondary to Right MCA (chronic) and deconditioning after  CABG/MVR Cont CIR, plan to DC to SNF tomorrow  Plan for d/c tomorrow  Will see patient for hospital follow-up in one month post-discharge 2. DVT Prophylaxis/Anticoagulation: Pharmaceutical:Coumadin per pharm protocol   INR therapeutic on 8/29 3. Pain Management:prn tylenol. Oxycodone or tramadol for more severe pain 4. Mood:LCSW to follow for evaluation and support. 5. Neuropsych: This patientis fullycapable of making decisions on hisown behalf. 6. Skin/Wound Care:Added protein supplements between meals 7. Fluids/Electrolytes/Nutrition:Strict I/O. Intake poor due to recent teeth extraction.   BMP within normal range on 8/27  Discussed importance of adhering to recommended diet with patient 8. CAD s/p CABG with MVR: Monitor for symptoms with activity. On coumadin and Lipitor--coreg added  9.PAF: Cont warfarin, coreg  10. R-MCA stroke with extension: Left sided weakness resolving. On coumadin.  11. Chronic systolic CHF: Encourage IS. Monitor weights daily with strict I/O. Cont meds Filed Weights   02/21/18 0416 02/22/18 0500 02/23/18 0557  Weight: 81.8 kg 82.3 kg 83.5 kg   Stable on 8/29 12. H/o BPH/Acute Urinary retention:   Cont foley at present per urology until outpatient follow up, urology signed off  UA +, will await urine culture 13. Constipation: Increased mirlax to bid 14. ABLA/ Continue to monitor H/H for recovery.    Hemoglobin 10.4 on 8/27  Continue to monitor 15. Hyponatremia: Continue to monitor for now.   Sodium 136 on 8/24  Continue to monitor 16. T2DM: Monitor BS ac/hs.  Levemir daily d/ced on 8/21  Glipizide started on 8/21, decreased to 2.5 mg on 8/27, DC'd on 8/28  SSI for tighter BS control.  Repaglinide d/ced on 8/25 CBG (last 3)  Recent Labs    02/22/18 1632 02/22/18 2104 02/23/18 0625  GLUCAP 88 84 82   Slightly labile, but overall controlled on 8/29  A FACE TO FACE EVALUATION WAS PERFORMED  Nathan [name] Nathan [name] 02/23/2018, 8:22  AM

## 2018-02-23 NOTE — Progress Notes (Signed)
ANTICOAGULATION CONSULT NOTE-Follow Up Note  Pharmacy Consult for warfarin Indication: atrial fibrillation  Allergies  Allergen Reactions  . Fentanyl Shortness Of Breath  . Promethazine Hcl Anaphylaxis and Other (See Comments)    Cardiac arrest  . Foeniculum Vulgare Other (See Comments)    Fennel bulbs--nausea only  . Metformin And Related Diarrhea  . Penicillins Nausea Only    Has patient had a PCN reaction causing immediate rash, facial/tongue/throat swelling, SOB or lightheadedness with hypotension: no Has patient had a PCN reaction causing severe rash involving mucus membranes or skin necrosis: no Has patient had a PCN reaction that required hospitalization: no Has patient had a PCN reaction occurring within the last 10 years: yes If all of the above answers are "NO", then may proceed with Cephalosporin use.   . Sulfa Antibiotics Other (See Comments)    G.I. Upset    Patient Measurements: Height: 5' 11.5" (181.6 cm) Weight: 184 lb 1.4 oz (83.5 kg) IBW/kg (Calculated) : 76.45  Vital Signs: Temp: 98.1 F (36.7 C) (08/29 0557) Temp Source: Oral (08/29 0557) BP: 100/70 (08/29 0557) Pulse Rate: 73 (08/29 0557)  Labs: Recent Labs    02/21/18 [id number] 02/22/18 0504 02/23/18 0719  HGB 10.4*  --   --   HCT 31.4*  --   --   PLT 263  --   --   LABPROT 25.0* 24.0* 25.1*  INR 2.29 2.18 2.30  CREATININE 0.96  --   --     Estimated Creatinine Clearance: 78.6 mL/min (by C-G formula based on SCr of 0.96 mg/dL).   Medical History: Past Medical History:  Diagnosis Date  . Atrial fibrillation with RVR (HCC) 05/01/2017  . Cardiomyopathy (HCC) 05/01/2017  . CHF (congestive heart failure) (HCC)   . 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 Coronary artery disease   . DM (diabetes mellitus) type 2, uncontrolled, with ketoacidosis (HCC) 05/01/2017  . Essential hypertension 11/26/2016  . History of kidney stones   . HLD (hyperlipidemia) 05/01/2017  .  IVCD (intraventricular conduction defect) 04/24/2015  . Mitral valve regurgitation   . Mixed dyslipidemia 11/26/2016  . Postoperative urinary retention 02/23/2018  . 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 Did have some hematuria but had resolved by 8/24. Has been therapeutic on lower dose since admit.   INR remains therapeutic and stable at 2.30 today.  No bleeding noted, and no new CBC, for d/c tomorrow.  Goal of Therapy:  INR 2-3 Monitor platelets by anticoagulation protocol: Yes   Plan:  Continue Warfarin 3mg  PO daily Daily INR, CBC, s/s bleeding  Daylene PoseyJonathan Cambelle Suchecki, PharmD Clinical Pharmacist Please check AMION for all [facility] numbers 02/23/2018 9:23 AM

## 2018-02-24 DIAGNOSIS — N390 Urinary tract infection, site not specified: Secondary | ICD-10-CM

## 2018-02-24 DIAGNOSIS — A499 Bacterial infection, unspecified: Secondary | ICD-10-CM

## 2018-02-24 LAB — CBC WITH DIFFERENTIAL/PLATELET
Abs Immature Granulocytes: 0 10*3/uL (ref 0.0–0.1)
BASOS PCT: 1 %
Basophils Absolute: 0.1 10*3/uL (ref 0.0–0.1)
Eosinophils Absolute: 0.1 10*3/uL (ref 0.0–0.7)
Eosinophils Relative: 2 %
HEMATOCRIT: 32.3 % — AB (ref 39.0–52.0)
HEMOGLOBIN: 10.5 g/dL — AB (ref 13.0–17.0)
Immature Granulocytes: 0 %
LYMPHS ABS: 1.4 10*3/uL (ref 0.7–4.0)
Lymphocytes Relative: 20 %
MCH: 29.8 pg (ref 26.0–34.0)
MCHC: 32.5 g/dL (ref 30.0–36.0)
MCV: 91.8 fL (ref 78.0–100.0)
MONO ABS: 0.9 10*3/uL (ref 0.1–1.0)
MONOS PCT: 12 %
Neutro Abs: 4.7 10*3/uL (ref 1.7–7.7)
Neutrophils Relative %: 65 %
PLATELETS: 249 10*3/uL (ref 150–400)
RBC: 3.52 MIL/uL — ABNORMAL LOW (ref 4.22–5.81)
RDW: 13.6 % (ref 11.5–15.5)
WBC: 7.3 10*3/uL (ref 4.0–10.5)

## 2018-02-24 LAB — URINE CULTURE

## 2018-02-24 LAB — GLUCOSE, CAPILLARY
GLUCOSE-CAPILLARY: 134 mg/dL — AB (ref 70–99)
Glucose-Capillary: 111 mg/dL — ABNORMAL HIGH (ref 70–99)

## 2018-02-24 LAB — PROTIME-INR
INR: 2.2
PROTHROMBIN TIME: 24.3 s — AB (ref 11.4–15.2)

## 2018-02-24 MED ORDER — GUAIFENESIN ER 600 MG PO TB12: 1200.0000 mg | ORAL_TABLET | Freq: Two times a day (BID) | ORAL | Status: DC | PRN

## 2018-02-24 MED ORDER — CIPROFLOXACIN HCL 500 MG PO TABS
250.0000 mg | ORAL_TABLET | Freq: Two times a day (BID) | ORAL | Status: DC
  Filled 2018-02-24: qty 1

## 2018-02-24 MED ORDER — CIPROFLOXACIN HCL 500 MG PO TABS
500.0000 mg | ORAL_TABLET | Freq: Once | ORAL | Status: AC
  Administered 2018-02-24: 500 mg via ORAL
  Filled 2018-02-24: qty 1

## 2018-02-24 MED ORDER — CIPROFLOXACIN HCL 250 MG PO TABS: 250.0000 mg | ORAL_TABLET | Freq: Two times a day (BID) | ORAL | Status: DC

## 2018-02-24 NOTE — Progress Notes (Signed)
Report called to [place] [facility] [place]. Patient discharged with ambulance service following exchange of coude catheter per order. No further questions from patient at this time.

## 2018-02-24 NOTE — Progress Notes (Signed)
Social Work Patient ID: Nathan Philipsandleman D Perrow Jr., male   DOB: 04-Jan-1946, 72 y.o.   MRN: [id number]  Insurance has approved pt transferring to [facility]. Have spoken with [facility] and pt/family all in agreement and will plan to transfer after lunch today. Pt is very pleased with this plan.

## 2018-02-24 NOTE — Progress Notes (Signed)
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  Patient/Family verbalized understanding of follow-up arrangements: Yes  Individual responsible for coordination of the follow-up plan: [name]-WIFE  Confirmed correct DME delivered: Elease Hashimoto 02/24/2018    Elease Hashimoto

## 2018-03-08 ENCOUNTER — Ambulatory Visit: Payer: Self-pay | Admitting: Cardiothoracic Surgery

## 2018-03-14 ENCOUNTER — Other Ambulatory Visit: Payer: Self-pay | Admitting: Cardiothoracic Surgery

## 2018-03-14 DIAGNOSIS — Z951 Presence of aortocoronary bypass graft: Secondary | ICD-10-CM

## 2018-03-15 ENCOUNTER — Ambulatory Visit
Admission: RE | Admit: 2018-03-15 | Discharge: 2018-03-15 | Disposition: A | Discharge status: Home / Self Care | Payer: Medicare Other | Source: Ambulatory Visit | Attending: Cardiothoracic Surgery | Admitting: Cardiothoracic Surgery

## 2018-03-15 ENCOUNTER — Other Ambulatory Visit: Payer: Self-pay

## 2018-03-15 ENCOUNTER — Encounter: Payer: Self-pay | Admitting: Cardiothoracic Surgery

## 2018-03-15 ENCOUNTER — Ambulatory Visit (INDEPENDENT_AMBULATORY_CARE_PROVIDER_SITE_OTHER): Payer: Self-pay | Admitting: Cardiothoracic Surgery

## 2018-03-15 VITALS — BP 82/58 | HR 89 | Resp 16 | Ht 71.0 in | Wt 208.2 lb

## 2018-03-15 DIAGNOSIS — Z952 Presence of prosthetic heart valve: Secondary | ICD-10-CM

## 2018-03-15 DIAGNOSIS — Z951 Presence of aortocoronary bypass graft: Secondary | ICD-10-CM

## 2018-03-15 NOTE — Progress Notes (Signed)
PCP is Hadley Penobbins, Robert A, MD Referring Provider is End, Cristal Deerhristopher, MD  Chief Complaint  Patient presents with  . Routine Post Op    s/p CABG X 4/MVR/CL of AA.[name].[name].01/23/18    HPI: Scheduled visit after CABG x4 and mitral valve replacement for ischemic cardiomyopathy, ischemic MR.  Patient had a preoperative stroke approximately 8 months before surgery.  He had a long hospital recovery and required inpatient rehab.  He transition from inpatient rehab to skilled nursing facility-[facility] in [place].  His cardiac status is stable however he is still generally weak and has difficulty ambulating.  There is no focal motor deficit.  He has no symptoms of angina or heart failure.  Incisions are well-healed.  The chest x-ray performed today is clear.  He is maintaining sinus rhythm but remains on Coumadin for now. Today his blood pressure is soft and we will stop his low-dose losartan.    Patient has had continued problems with urinary retention and currently has an indwelling Foley.  He has a recent history of gram-negative UTI that is been treated. Past Medical History:  Diagnosis Date  . Atrial fibrillation with RVR (HCC) 05/01/2017  . Cardiomyopathy (HCC) 05/01/2017  . CHF (congestive heart failure) (HCC)   . 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 Coronary artery disease   . DM (diabetes mellitus) type 2, uncontrolled, with ketoacidosis (HCC) 05/01/2017  . Essential hypertension 11/26/2016  . History of kidney stones   . HLD (hyperlipidemia) 05/01/2017  . IVCD (intraventricular conduction defect) 04/24/2015  . Mitral valve regurgitation   . Mixed dyslipidemia 11/26/2016  . Postoperative urinary retention 02/23/2018  . 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 BRAIN SURGERY    . CARDIAC CATHETERIZATION    . CORONARY ARTERY BYPASS GRAFT N/A 01/23/2018   Procedure: CORONARY ARTERY BYPASS GRAFTING (CABG) x 4 WITH  ENDOSCOPIC HARVESTING OF RIGHT GREATER SAPHENOUS VEIN: LIMA TO LAD, SVG TO RCA, SVG TO DIAG, SVG TO OM ;  Surgeon: [name], Orenthal Debski, MD;  [facility];  Service: Open Heart Surgery;  Laterality: N/A;  . IR PERCUTANEOUS ART THROMBECTOMY/INFUSION INTRACRANIAL INC DIAG ANGIO  04/29/2017  . IR RADIOLOGIST EVAL & MGMT  05/17/2017  . IR US GUIDE VASC ACCESS LEFT  04/29/2017  . IR US GUIDE VASC ACCESS RIGHT  04/29/2017  . LEFT ATRIAL APPENDAGE OCCLUSION Left 01/23/2018   Procedure: LEFT ATRIAL APPENDAGE OCCLUSION USING ATRICURE ATRICLIP PRO2 LAA EXCLUSION SYSTEM  SIZE 40, LOT # [id number], CAT # [id number], EXP. DATE 2020-04-28;  Surgeon: Donata ClayVan Trigt, Theron AristaPeter, MD;  [facility];  Service: Open Heart Surgery;  Laterality: Left;  . MITRAL VALVE REPLACEMENT N/A 01/23/2018   Procedure: MITRAL VALVE (MV) REPLACEMENT USING MAGNA MITRAL EASE PERICARDIAL BIOPROSTHESIS, MODEL 7300TFX, SIZE 29 MM, SERIAL # [id number], EXPIRATION  DATE 2021-03-03.;  Surgeon: [name], Danniel Tones, MD;  [facility];  Service: Open Heart Surgery;  Laterality: N/A;  . MULTIPLE EXTRACTIONS WITH ALVEOLOPLASTY N/A 12/26/2017   Procedure: Extraction of tooth #'s 4,6-11, 18 -27, 30, and 31 with alveoloplasty;  Surgeon: Charlynne PanderKulinski, Ronald F, DDS;  Location: [facility] OR;  Service: Oral Surgery;  Laterality: N/A;  . RADIOLOGY WITH ANESTHESIA N/A 04/29/2017   Procedure: IR WITH ANESTHESIA;  Surgeon: Radiologist, Medication, MD;  Location: [facility] OR;  Service: Radiology;  Laterality: N/A;  . RIGHT/LEFT HEART CATH AND CORONARY ANGIOGRAPHY N/A 08/18/2017   Procedure: RIGHT/LEFT HEART CATH AND CORONARY ANGIOGRAPHY;  Surgeon:  End, [name], MD;  Location: [facility] INVASIVE CV LAB;  Service: Cardiovascular;  Laterality: N/A;  . TEE WITHOUT CARDIOVERSION N/A 09/22/2017   Procedure: TRANSESOPHAGEAL ECHOCARDIOGRAM (TEE);  Surgeon: [name], MD;  Location: [facility] ENDOSCOPY;  Service: Cardiovascular;  Laterality: N/A;  . TEE WITHOUT CARDIOVERSION N/A 01/23/2018   Procedure:  TRANSESOPHAGEAL ECHOCARDIOGRAM (TEE);  Surgeon: Donata Clay, Theron Arista, MD;  Location: [facility] OR;  Service: Open Heart Surgery;  Laterality: N/A;  . TONSILLECTOMY      Family History  Problem Relation Age of Onset  . Hypertension Father   . Diabetes Father     Social History Social History   Tobacco Use  . Smoking status: Former Smoker    Types: Cigarettes    Last attempt to quit: 1994    Years since quitting: 25.7  . Smokeless tobacco: Never Used  Substance Use Topics  . Alcohol use: Yes    Alcohol/week: 3.0 standard drinks    Types: 1 Glasses of wine, 1 Cans of beer, 1 Standard drinks or equivalent per week    Comment: mix drink  . Drug use: No    Current Outpatient Medications  Medication Sig Dispense Refill  . acetaminophen (TYLENOL) 325 MG tablet Take 650 mg by mouth every 6 (six) hours as needed (for pain.).     [name] aspirin EC 81 MG tablet Take 81 mg by mouth daily.    . bethanechol (URECHOLINE) 50 MG tablet Take 50 mg by mouth 2 (two) times daily.    . bisacodyl (DULCOLAX) 5 MG EC tablet Take 2 tablets (10 mg total) by mouth daily. 30 tablet 0  . carvedilol (COREG) 3.125 MG tablet Take 1 tablet (3.125 mg total) by mouth 2 (two) times daily with a meal.    . Cholecalciferol (VITAMIN D) 2000 units CAPS Take 1 capsule by mouth daily.    [name] guaiFENesin (MUCINEX) 600 MG 12 hr tablet Take 2 tablets (1,200 mg total) by mouth 2 (two) times daily as needed.    . insulin regular (NOVOLIN R,HUMULIN R) 100 units/mL injection Inject into the skin 3 (three) times daily before meals. AS PER SLIDING SCALE    . losartan (COZAAR) 25 MG tablet Take 0.5 tablets (12.5 mg total) by mouth at bedtime.    . pantoprazole (PROTONIX) 40 MG tablet Take 1 tablet (40 mg total) by mouth daily.    . polyethylene glycol (MIRALAX / GLYCOLAX) packet Take 17 g by mouth 2 (two) times daily. 14 each 0  . protein supplement shake (PREMIER PROTEIN) LIQD Take 325 mLs (11 oz total) by mouth 3 (three) times daily with meals.   0  . rosuvastatin (CRESTOR) 20 MG tablet Take 1 tablet (20 mg total) by mouth daily at 6 PM.    . spironolactone (ALDACTONE) 25 MG tablet Take 0.5 tablets (12.5 mg total) by mouth daily.    . tamsulosin (FLOMAX) 0.4 MG CAPS capsule Take 0.4 mg by mouth at bedtime.   3  . traZODone (DESYREL) 50 MG tablet Take 0.5-1 tablets (25-50 mg total) by mouth at bedtime as needed for sleep.    [name] warfarin (COUMADIN) 3 MG tablet Take 1 tablet (3 mg total) by mouth daily at 6 PM.     No current facility-administered medications for this visit.     Allergies  Allergen Reactions  . Fentanyl Shortness Of Breath  . Promethazine Hcl Anaphylaxis and Other (See Comments)    Cardiac arrest  . Foeniculum Vulgare Other (See Comments)    Fennel  bulbs--nausea only  . Metformin And Related Diarrhea  . Penicillins Nausea Only    Has patient had a PCN reaction causing immediate rash, facial/tongue/throat swelling, SOB or lightheadedness with hypotension: no Has patient had a PCN reaction causing severe rash involving mucus membranes or skin necrosis: no Has patient had a PCN reaction that required hospitalization: no Has patient had a PCN reaction occurring within the last 10 years: yes If all of the above answers are "NO", then may proceed with Cephalosporin use.   . Sulfa Antibiotics Other (See Comments)    G.I. Upset    Review of Systems  He has lost weight but that has now plateaued. He denies any pain. His main difficulty is with ambulation.  He has had no falls or dizziness.  BP (!) 82/58 (BP Location: Left Arm, Patient Position: Sitting, Cuff Size: Large)   Pulse 89   Resp 16   Ht 5\' 11"  (1.803 m)   Wt 208 lb 3.2 oz (94.4 kg)   SpO2 98% Comment: ON RA  BMI 29.04 kg/m  Physical Exam      Exam    General- alert and comfortable    Neck- no JVD, no cervical adenopathy palpable, no carotid bruit   Lungs- clear without rales, wheezes   Cor- regular rate and rhythm, no murmur , gallop   Abdomen-  soft, non-tender   Extremities - warm, non-tender, minimal edema   Neuro- oriented, appropriate, no focal weakness   Diagnostic Tests: Chest x-ray clear  Impression: Slow recovery in this 71 year old male with significant comorbid pre-medical problems which required a extensive preoperative preparation for the surgery, multivessel CABG with combined mitral valve replacement.  His cardiac status has significantly improved and his overall strength and reconditioning should be expected to improve as well.  He will need to stay at the skilled nursing facility to receive daily physical therapy at this time.  He will stop his losartan because of low blood pressures.  Continue other meds.  Plan: I will see him back in 4 weeks for monitoring progress.   Mikey Bussing, MD [facility] [phone]

## 2018-03-30 ENCOUNTER — Telehealth: Payer: Self-pay | Admitting: Cardiology

## 2018-03-30 NOTE — Telephone Encounter (Signed)
Attempted to contact patient with no answer, unable to leave message.  

## 2018-03-30 NOTE — Telephone Encounter (Signed)
Just had bypass surgery and has questions about his meds

## 2018-04-07 DIAGNOSIS — I5032 Chronic diastolic (congestive) heart failure: Secondary | ICD-10-CM

## 2018-04-07 DIAGNOSIS — K219 Gastro-esophageal reflux disease without esophagitis: Secondary | ICD-10-CM | POA: Diagnosis not present

## 2018-04-07 DIAGNOSIS — I10 Essential (primary) hypertension: Secondary | ICD-10-CM

## 2018-04-07 DIAGNOSIS — R079 Chest pain, unspecified: Secondary | ICD-10-CM

## 2018-04-07 DIAGNOSIS — E785 Hyperlipidemia, unspecified: Secondary | ICD-10-CM

## 2018-04-08 DIAGNOSIS — I255 Ischemic cardiomyopathy: Secondary | ICD-10-CM | POA: Diagnosis not present

## 2018-04-08 DIAGNOSIS — R079 Chest pain, unspecified: Secondary | ICD-10-CM | POA: Diagnosis not present

## 2018-04-08 DIAGNOSIS — K219 Gastro-esophageal reflux disease without esophagitis: Secondary | ICD-10-CM | POA: Diagnosis not present

## 2018-04-08 DIAGNOSIS — I5032 Chronic diastolic (congestive) heart failure: Secondary | ICD-10-CM | POA: Diagnosis not present

## 2018-04-08 DIAGNOSIS — I10 Essential (primary) hypertension: Secondary | ICD-10-CM | POA: Diagnosis not present

## 2018-04-08 DIAGNOSIS — E785 Hyperlipidemia, unspecified: Secondary | ICD-10-CM | POA: Diagnosis not present

## 2018-04-09 DIAGNOSIS — K219 Gastro-esophageal reflux disease without esophagitis: Secondary | ICD-10-CM | POA: Diagnosis not present

## 2018-04-09 DIAGNOSIS — I255 Ischemic cardiomyopathy: Secondary | ICD-10-CM | POA: Diagnosis not present

## 2018-04-09 DIAGNOSIS — I10 Essential (primary) hypertension: Secondary | ICD-10-CM | POA: Diagnosis not present

## 2018-04-09 DIAGNOSIS — E785 Hyperlipidemia, unspecified: Secondary | ICD-10-CM | POA: Diagnosis not present

## 2018-04-09 DIAGNOSIS — R079 Chest pain, unspecified: Secondary | ICD-10-CM | POA: Diagnosis not present

## 2018-04-09 DIAGNOSIS — I5032 Chronic diastolic (congestive) heart failure: Secondary | ICD-10-CM | POA: Diagnosis not present

## 2018-04-10 ENCOUNTER — Telehealth: Payer: Self-pay

## 2018-04-10 DIAGNOSIS — I255 Ischemic cardiomyopathy: Secondary | ICD-10-CM

## 2018-04-10 DIAGNOSIS — I4891 Unspecified atrial fibrillation: Secondary | ICD-10-CM

## 2018-04-10 DIAGNOSIS — K219 Gastro-esophageal reflux disease without esophagitis: Secondary | ICD-10-CM | POA: Diagnosis not present

## 2018-04-10 DIAGNOSIS — I10 Essential (primary) hypertension: Secondary | ICD-10-CM | POA: Diagnosis not present

## 2018-04-10 DIAGNOSIS — R079 Chest pain, unspecified: Secondary | ICD-10-CM | POA: Diagnosis not present

## 2018-04-10 DIAGNOSIS — I5032 Chronic diastolic (congestive) heart failure: Secondary | ICD-10-CM | POA: Diagnosis not present

## 2018-04-10 DIAGNOSIS — E785 Hyperlipidemia, unspecified: Secondary | ICD-10-CM | POA: Diagnosis not present

## 2018-04-10 DIAGNOSIS — Z952 Presence of prosthetic heart valve: Secondary | ICD-10-CM

## 2018-04-10 NOTE — Telephone Encounter (Signed)
Patient's wife contacted the office concerned about Nathan [name].  She stated that he was at [facility] because of recent chest pain and was going to have some testing run.  He is s/p MVR on 01/23/18 with Dr. Donata Clay.  She also stated that his INR level was 1.2, which she knew it was supposed to be between 2-3.  I asked if he had been seen by his Cardiologist, to which she stated "no".  He had been having his INR checked by a Rehabilitation facility that he was in since discharge roughly 2 weeks ago.  She stated that they had been meaning to contact his Cardiologist, Dr. Shirlee Latch as soon as possible to get him in to be seen.  However, in the meantime I advised that the ED there could check his INR and give appropriate medications to help bring the lab result up.  She acknowledged receipt and thanked me for the call.

## 2018-04-10 NOTE — Telephone Encounter (Signed)
Unable to leave voicemail for the patient to call the office, will await their call back.

## 2018-04-19 DIAGNOSIS — I255 Ischemic cardiomyopathy: Secondary | ICD-10-CM

## 2018-04-19 DIAGNOSIS — R8290 Unspecified abnormal findings in urine: Secondary | ICD-10-CM

## 2018-04-19 DIAGNOSIS — I951 Orthostatic hypotension: Secondary | ICD-10-CM

## 2018-04-19 DIAGNOSIS — I5020 Unspecified systolic (congestive) heart failure: Secondary | ICD-10-CM | POA: Diagnosis not present

## 2018-04-19 DIAGNOSIS — K219 Gastro-esophageal reflux disease without esophagitis: Secondary | ICD-10-CM

## 2018-04-20 DIAGNOSIS — I5020 Unspecified systolic (congestive) heart failure: Secondary | ICD-10-CM | POA: Diagnosis not present

## 2018-04-20 DIAGNOSIS — R8290 Unspecified abnormal findings in urine: Secondary | ICD-10-CM | POA: Diagnosis not present

## 2018-04-20 DIAGNOSIS — I255 Ischemic cardiomyopathy: Secondary | ICD-10-CM | POA: Diagnosis not present

## 2018-04-20 DIAGNOSIS — I951 Orthostatic hypotension: Secondary | ICD-10-CM | POA: Diagnosis not present

## 2018-04-27 ENCOUNTER — Encounter: Payer: Medicare Other | Attending: Physical Medicine & Rehabilitation | Admitting: Physical Medicine & Rehabilitation

## 2018-04-28 ENCOUNTER — Ambulatory Visit (INDEPENDENT_AMBULATORY_CARE_PROVIDER_SITE_OTHER): Payer: Medicare Other | Admitting: Cardiology

## 2018-04-28 ENCOUNTER — Encounter: Payer: Self-pay | Admitting: Cardiology

## 2018-04-28 VITALS — BP 92/64 | HR 86 | Ht 71.5 in | Wt 175.1 lb

## 2018-04-28 DIAGNOSIS — I5022 Chronic systolic (congestive) heart failure: Secondary | ICD-10-CM

## 2018-04-28 DIAGNOSIS — I110 Hypertensive heart disease with heart failure: Secondary | ICD-10-CM

## 2018-04-28 DIAGNOSIS — I952 Hypotension due to drugs: Secondary | ICD-10-CM | POA: Diagnosis not present

## 2018-04-28 DIAGNOSIS — I2510 Atherosclerotic heart disease of native coronary artery without angina pectoris: Secondary | ICD-10-CM

## 2018-04-28 DIAGNOSIS — I255 Ischemic cardiomyopathy: Secondary | ICD-10-CM

## 2018-04-28 DIAGNOSIS — I480 Paroxysmal atrial fibrillation: Secondary | ICD-10-CM

## 2018-04-28 HISTORY — DX: Ischemic cardiomyopathy: I25.5

## 2018-04-28 MED ORDER — SACUBITRIL-VALSARTAN 24-26 MG PO TABS: 1.0000 | ORAL_TABLET | Freq: Every day | ORAL | 3 refills | Status: DC

## 2018-04-28 MED ORDER — CARVEDILOL 3.125 MG PO TABS: 3.1250 mg | ORAL_TABLET | Freq: Every day | ORAL | 3 refills | Status: DC

## 2018-04-28 NOTE — Patient Instructions (Signed)
Medication Instructions:  Your physician has recommended you make the following change in your medication:   DECREASE carvedilol (coreg) 3.125 mg: Take 1 tablet daily in the evening  RESTART sacubitril-valsartan (entresto) 24-26 mg: Take 1 tablet daily in the morning  If you need a refill on your cardiac medications before your next appointment, please call your pharmacy.   Lab work: None  If you have labs (blood work) drawn today and your tests are completely normal, you will receive your results only by: [name] MyChart Message (if you have MyChart) OR . A paper copy in the mail If you have any lab test that is abnormal or we need to change your treatment, we will call you to review the results.  Testing/Procedures: None  Follow-Up: At [facility], you and your health needs are our priority.  As part of our continuing mission to provide you with exceptional heart care, we have created designated Provider Care Teams.  These Care Teams include your primary Cardiologist (physician) and Advanced Practice Providers (APPs -  Physician Assistants and Nurse Practitioners) who all work together to provide you with the care you need, when you need it. You will need a follow up appointment in 1 months.

## 2018-04-28 NOTE — Progress Notes (Signed)
Cardiology Office Note:    Date:  04/28/2018   ID:  Nathan Philips., DOB 1946/03/06, MRN [id number]  PCP:  [name], MD  Cardiologist:  Norman Herrlich, MD    Referring MD: [name], MD    ASSESSMENT:    1. Ischemic cardiomyopathy   2. Hypotension due to drugs   3. Chronic systolic congestive heart failure (HCC)   4. Hypertensive heart disease with heart failure (HCC)   5. CAD in native artery   6. PAF (paroxysmal atrial fibrillation) (HCC)    PLAN:    In order of problems listed above:  1. Will resume his Sherryll Burger reassess in 2 months and decision regarding prophylactic ICD.  Continue other therapy 2. Improved 3. Compensators continue current treatment we will do our best to uptitrate drugs 4. Stable blood pressure is adequate to resume Entresto 5. Stable after bypass surgery 6. Stable maintaining sinus rhythm with history of recent presentations to [facility] in a regular rhythm today.   Next appointment: 4 weeks   Medication Adjustments/Labs and Tests Ordered: Current medicines are reviewed at length with the patient today.  Concerns regarding medicines are outlined above.  No orders of the defined types were placed in this encounter.  No orders of the defined types were placed in this encounter.   No chief complaint on file.   History of Present Illness:    Nathan D Rudolfo Brandow. is a 72 y.o. male with a hx of  CABG x4 01/23/18 and mitral valve replacement 29 mm [facility] Ease pericardial tissue valve with left atrial appendage ligation.  Patient had a preoperative stroke approximately 8 months before surgery.  He had a long hospital recovery and required inpatient rehab.  He transition from inpatient rehab to skilled nursing facility-[facility] in [place].  His cardiac status is stable however he is still generally weak and has difficulty ambulating.  There is no focal motor deficit.  He has no symptoms of angina or heart failure.   Incisions are well-healed.  The chest x-ray performed today is clear.  He is maintaining sinus rhythm but remains on Coumadin for now.He was seen by me 07/28/17 for chronic combined systolic and diastolic heart failure EF 30-35% with moderate MR , LBBB and hypertensive heart disease.  Recently seen at  [facility] ED 04/19/2018 with hypotension after [facility] admission found to have severe LV dysfunction EF 20% by echo 13% by nuclear MUGA technique and was started on guideline directed medical therapy . Subsequently he he presented to the hospital with blood pressure of 91/50 his CBC was normal his BMP was normal proBNP level relatively low at 924 .[name]  A phone conversation with cardiology occurred he was given IV fluids and was subsequently discharged from the hospital.  At the time of discharge she was continued on Spironolactone and low-dose Entresto.  Carvedilol and spironolactone.      Compliance with diet, lifestyle and medications: Yes  He is seen by me unfortunately his Sherryll Burger was discontinued overages reduce his Coreg to once a day restart Entresto we will do our best to uptitrate.  I discussed 2 issues with the patient his wife the first of his ICD they want a trial of optimal medical therapy and will reassess in 2 months.  The second is long-term anticoagulation he had ligation of his left atrial appendage I will asked Dr. Maren Beach if we can stop warfarin in the future.  He is overall doing better he has  no shortness of breath lightheadedness chest pain palpitations syncope or edema and today repeat blood pressure by me right upper extremity 100/60.   Past Surgical History:  Procedure Laterality Date  . BRAIN SURGERY    . CARDIAC CATHETERIZATION    . CORONARY ARTERY BYPASS GRAFT N/A 01/23/2018   Procedure: CORONARY ARTERY BYPASS GRAFTING (CABG) x 4 WITH ENDOSCOPIC HARVESTING OF RIGHT GREATER SAPHENOUS VEIN: LIMA TO LAD, SVG TO RCA, SVG TO DIAG, SVG TO OM ;  Surgeon: Kerin Perna, MD;  Location: [facility] OR;  Service: Open Heart Surgery;  Laterality: N/A;  . IR PERCUTANEOUS ART THROMBECTOMY/INFUSION INTRACRANIAL INC DIAG ANGIO  04/29/2017  . IR RADIOLOGIST EVAL & MGMT  05/17/2017  . IR US GUIDE VASC ACCESS LEFT  04/29/2017  . IR US GUIDE VASC ACCESS RIGHT  04/29/2017  . LEFT ATRIAL APPENDAGE OCCLUSION Left 01/23/2018   Procedure: LEFT ATRIAL APPENDAGE OCCLUSION USING ATRICURE ATRICLIP PRO2 LAA EXCLUSION SYSTEM  SIZE 40, LOT # [id number], CAT # [id number], EXP. DATE 2020-04-28;  Surgeon: Donata Clay, Theron Arista, MD;  Location: [facility] OR;  Service: Open Heart Surgery;  Laterality: Left;  . MITRAL VALVE REPLACEMENT N/A 01/23/2018   Procedure: MITRAL VALVE (MV) REPLACEMENT USING MAGNA MITRAL EASE PERICARDIAL BIOPROSTHESIS, MODEL 7300TFX, SIZE 29 MM, SERIAL # [id number], EXPIRATION  DATE 2021-03-03.;  Surgeon: Kerin Perna, MD;  Location: [facility] OR;  Service: Open Heart Surgery;  Laterality: N/A;  . MULTIPLE EXTRACTIONS WITH ALVEOLOPLASTY N/A 12/26/2017   Procedure: Extraction of tooth #'s 4,6-11, 18 -27, 30, and 31 with alveoloplasty;  Surgeon: [name], DDS;  Location: [facility] OR;  Service: Oral Surgery;  Laterality: N/A;  . RADIOLOGY WITH ANESTHESIA N/A 04/29/2017   Procedure: IR WITH ANESTHESIA;  Surgeon: Radiologist, Medication, MD;  Location: [facility] OR;  Service: Radiology;  Laterality: N/A;  . RIGHT/LEFT HEART CATH AND CORONARY ANGIOGRAPHY N/A 08/18/2017   Procedure: RIGHT/LEFT HEART CATH AND CORONARY ANGIOGRAPHY;  Surgeon: [name], MD;  Location: [facility] INVASIVE CV LAB;  Service: Cardiovascular;  Laterality: N/A;  . TEE WITHOUT CARDIOVERSION N/A 09/22/2017   Procedure: TRANSESOPHAGEAL ECHOCARDIOGRAM (TEE);  Surgeon: [name], MD;  Location: [facility] ENDOSCOPY;  Service: Cardiovascular;  Laterality: N/A;  . TEE WITHOUT CARDIOVERSION N/A 01/23/2018   Procedure: TRANSESOPHAGEAL ECHOCARDIOGRAM (TEE);  Surgeon: Donata Clay, Theron Arista, MD;  Location: [facility] OR;  Service: Open Heart Surgery;  Laterality: N/A;    . TONSILLECTOMY      Current Medications: No outpatient medications have been marked as taking for the 04/28/18 encounter (Appointment) with Baldo Daub, MD.     Allergies:   Fentanyl; Promethazine hcl; Foeniculum vulgare; Metformin and related; Penicillins; and Sulfa antibiotics   Social History   Socioeconomic History  . Marital status: Married    Spouse name: Not on file  . Number of children: Not on file  . Years of education: Not on file  . Highest education level: Not on file  Occupational History  . Not on file  Social Needs  . Financial resource strain: Not on file  . Food insecurity:    Worry: Not on file    Inability: Not on file  . Transportation needs:    Medical: Not on file    Non-medical: Not on file  Tobacco Use  . Smoking status: Former Smoker    Types: Cigarettes    Last attempt to quit: 1994    Years since quitting: 25.8  . Smokeless tobacco: Never Used  Substance and Sexual Activity  . Alcohol  use: Yes    Alcohol/week: 3.0 standard drinks    Types: 1 Glasses of wine, 1 Cans of beer, 1 Standard drinks or equivalent per week    Comment: mix drink  . Drug use: No  . Sexual activity: Not on file  Lifestyle  . Physical activity:    Days per week: Not on file    Minutes per session: Not on file  . Stress: Not on file  Relationships  . Social connections:    Talks on phone: Not on file    Gets together: Not on file    Attends religious service: Not on file    Active member of club or organization: Not on file    Attends meetings of clubs or organizations: Not on file    Relationship status: Not on file  Other Topics Concern  . Not on file  Social History Narrative  . Not on file     Family History: The patient's family history includes Diabetes in his father; Hypertension in his father. ROS:   Please see the history of present illness.    All other systems reviewed and are negative.  EKGs/Labs/Other Studies Reviewed:    The  following studies were reviewed today:   Recent Labs: 05/18/2017: BNP 92.3 07/28/2017: TSH 2.140 01/24/2018: Magnesium 2.4 02/04/2018: ALT 18 02/21/2018: BUN 14; Creatinine, Ser 0.96; Potassium 4.4; Sodium 135 02/24/2018: Hemoglobin 10.5; Platelets 249  Recent Lipid Panel    Component Value Date/Time   CHOL 136 07/28/2017 1708   TRIG 101 07/28/2017 1708   HDL 34 (L) 07/28/2017 1708   CHOLHDL 4.0 07/28/2017 1708   CHOLHDL 3.4 04/29/2017 0451   VLDL 21 04/29/2017 0451   LDLCALC 82 07/28/2017 1708    Physical Exam:    VS:  There were no vitals taken for this visit.    Wt Readings from Last 3 Encounters:  03/15/18 208 lb 3.2 oz (94.4 kg)  02/24/18 184 lb 15.5 oz (83.9 kg)  02/03/18 195 lb 1.7 oz (88.5 kg)     GEN: He looks quite frail and weak well nourished, well developed in no acute distress HEENT: Normal NECK: No JVD; No carotid bruits LYMPHATICS: No lymphadenopathy CARDIAC: Regular rhythm  no murmurs, rubs, gallops RESPIRATORY:  Clear to auscultation without rales, wheezing or rhonchi  ABDOMEN: Soft, non-tender, non-distended MUSCULOSKELETAL:  No edema; No deformity  SKIN: Warm and dry NEUROLOGIC:  Alert and oriented x 3 PSYCHIATRIC:  Normal affect    Signed, Norman Herrlich, MD  04/28/2018 1:39 PM    [facility]

## 2018-05-04 ENCOUNTER — Ambulatory Visit: Payer: Medicare Other | Admitting: Cardiothoracic Surgery

## 2018-05-10 ENCOUNTER — Ambulatory Visit: Payer: Medicare Other | Admitting: Cardiothoracic Surgery

## 2018-05-10 ENCOUNTER — Encounter: Payer: Self-pay | Admitting: Cardiothoracic Surgery

## 2018-05-10 ENCOUNTER — Other Ambulatory Visit: Payer: Self-pay

## 2018-05-10 VITALS — BP 104/78 | HR 81 | Resp 20 | Ht 71.5 in | Wt 177.8 lb

## 2018-05-10 DIAGNOSIS — Z951 Presence of aortocoronary bypass graft: Secondary | ICD-10-CM | POA: Diagnosis not present

## 2018-05-10 DIAGNOSIS — Z952 Presence of prosthetic heart valve: Secondary | ICD-10-CM

## 2018-05-10 NOTE — Progress Notes (Signed)
PCP is [name], MD Referring Provider is End, [name], MD  Chief Complaint  Patient presents with  . Routine Post Op    f/u, s/p MVR/ CABG x4 03/26/18    HPI: 72 year old patient with history of stroke about a year ago returns for scheduled visit 4 months after combined CABG-mitral valve replacement-left atrial closure.  He probably had an extension of the preoperative stroke perioperatively.  Patient has made significant progress since last visit.  Patient is now at home receiving home PT and restorative nursing.  The patient is more mobile and stronger.  The indwelling catheter has been removed by the urologist.  His cardiac status is improved on Entresto.  He has had no falls.  His warfarin has been well controlled with last INR 3.0 and no bleeding episodes.  The patient's short-term memory is still abnormal.  He is not ready to drive and needs assistance with his ADLs.   Past Medical History:  Diagnosis Date  . Atrial fibrillation with RVR (HCC) 05/01/2017  . Cardiomyopathy (HCC) 05/01/2017  . CHF (congestive heart failure) (HCC)   . 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 Coronary artery disease   . DM (diabetes mellitus) type 2, uncontrolled, with ketoacidosis (HCC) 05/01/2017  . Essential hypertension 11/26/2016  . History of kidney stones   . HLD (hyperlipidemia) 05/01/2017  . IVCD (intraventricular conduction defect) 04/24/2015  . Mitral valve regurgitation   . Mixed dyslipidemia 11/26/2016  . Postoperative urinary retention 02/23/2018  . 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 BRAIN SURGERY    . CARDIAC CATHETERIZATION    . CORONARY ARTERY BYPASS GRAFT N/A 01/23/2018   Procedure: CORONARY ARTERY BYPASS GRAFTING (CABG) x 4 WITH ENDOSCOPIC HARVESTING OF RIGHT GREATER SAPHENOUS VEIN: LIMA TO LAD, SVG TO RCA, SVG TO DIAG, SVG TO OM ;  Surgeon: Kerin Perna, MD;  Location: [facility] OR;   Service: Open Heart Surgery;  Laterality: N/A;  . IR PERCUTANEOUS ART THROMBECTOMY/INFUSION INTRACRANIAL INC DIAG ANGIO  04/29/2017  . IR RADIOLOGIST EVAL & MGMT  05/17/2017  . IR US GUIDE VASC ACCESS LEFT  04/29/2017  . IR US GUIDE VASC ACCESS RIGHT  04/29/2017  . LEFT ATRIAL APPENDAGE OCCLUSION Left 01/23/2018   Procedure: LEFT ATRIAL APPENDAGE OCCLUSION USING ATRICURE ATRICLIP PRO2 LAA EXCLUSION SYSTEM  SIZE 40, LOT # [id number], CAT # [id number], EXP. DATE 2020-04-28;  Surgeon: Donata Clay, Theron Arista, MD;  Location: [facility] OR;  Service: Open Heart Surgery;  Laterality: Left;  . MITRAL VALVE REPLACEMENT N/A 01/23/2018   Procedure: MITRAL VALVE (MV) REPLACEMENT USING MAGNA MITRAL EASE PERICARDIAL BIOPROSTHESIS, MODEL [id number], SIZE 29 MM, SERIAL # [id number], EXPIRATION  DATE 2021-03-03.;  Surgeon: Kerin Perna, MD;  Location: [facility] OR;  Service: Open Heart Surgery;  Laterality: N/A;  . MULTIPLE EXTRACTIONS WITH ALVEOLOPLASTY N/A 12/26/2017   Procedure: Extraction of tooth #'s 4,6-11, 18 -27, 30, and 31 with alveoloplasty;  Surgeon: [name], DDS;  Location: [facility] OR;  Service: Oral Surgery;  Laterality: N/A;  . RADIOLOGY WITH ANESTHESIA N/A 04/29/2017   Procedure: IR WITH ANESTHESIA;  Surgeon: Radiologist, Medication, MD;  Location: [facility] OR;  Service: Radiology;  Laterality: N/A;  . RIGHT/LEFT HEART CATH AND CORONARY ANGIOGRAPHY N/A 08/18/2017   Procedure: RIGHT/LEFT HEART CATH AND CORONARY ANGIOGRAPHY;  Surgeon: [name], MD;  Location: [facility] INVASIVE CV LAB;  Service: Cardiovascular;  Laterality: N/A;  . TEE WITHOUT CARDIOVERSION N/A 09/22/2017  Procedure: TRANSESOPHAGEAL ECHOCARDIOGRAM (TEE);  Surgeon: [name], MD;  Location: [facility] ENDOSCOPY;  Service: Cardiovascular;  Laterality: N/A;  . TEE WITHOUT CARDIOVERSION N/A 01/23/2018   Procedure: TRANSESOPHAGEAL ECHOCARDIOGRAM (TEE);  Surgeon: Donata Clay, Theron Arista, MD;  Location: [facility] OR;  Service: Open Heart Surgery;  Laterality: N/A;  . TONSILLECTOMY      Family  History  Problem Relation Age of Onset  . Hypertension Father   . Diabetes Father     Social History Social History   Tobacco Use  . Smoking status: Former Smoker    Types: Cigarettes    Last attempt to quit: 1994    Years since quitting: 25.8  . Smokeless tobacco: Never Used  Substance Use Topics  . Alcohol use: Yes    Alcohol/week: 3.0 standard drinks    Types: 1 Glasses of wine, 1 Cans of beer, 1 Standard drinks or equivalent per week    Comment: mix drink  . Drug use: No    Current Outpatient Medications  Medication Sig Dispense Refill  . bethanechol (URECHOLINE) 50 MG tablet Take 50 mg by mouth 2 (two) times daily.    . carvedilol (COREG) 3.125 MG tablet Take 1 tablet (3.125 mg total) by mouth daily after supper. 30 tablet 3  . pantoprazole (PROTONIX) 40 MG tablet Take 1 tablet (40 mg total) by mouth daily.    . pravastatin (PRAVACHOL) 20 MG tablet Take 20 mg by mouth daily.    . sacubitril-valsartan (ENTRESTO) 24-26 MG Take 1 tablet by mouth daily with breakfast. 30 tablet 3  . spironolactone (ALDACTONE) 25 MG tablet Take 0.5 tablets (12.5 mg total) by mouth daily.    . tamsulosin (FLOMAX) 0.4 MG CAPS capsule Take 0.4 mg by mouth at bedtime.   3  . warfarin (COUMADIN) 3 MG tablet Take 1 tablet (3 mg total) by mouth daily at 6 PM. (Patient taking differently: Take 3 mg by mouth daily at 6 PM. Currently taking 4 mg everyday)     No current facility-administered medications for this visit.     Allergies  Allergen Reactions  . Fentanyl Shortness Of Breath  . Promethazine Hcl Anaphylaxis and Other (See Comments)    Cardiac arrest  . Foeniculum Vulgare Other (See Comments)    Fennel bulbs--nausea only  . Metformin And Related Diarrhea  . Penicillins Nausea Only    Has patient had a PCN reaction causing immediate rash, facial/tongue/throat swelling, SOB or lightheadedness with hypotension: no Has patient had a PCN reaction causing severe rash involving mucus membranes or  skin necrosis: no Has patient had a PCN reaction that required hospitalization: no Has patient had a PCN reaction occurring within the last 10 years: yes If all of the above answers are "NO", then may proceed with Cephalosporin use.   . Sulfa Antibiotics Other (See Comments)    G.I. Upset    Review of Systems  Appetite and weight are stable No fever Surgical incisions completely healed No edema  BP 104/78 (BP Location: Left Arm, Patient Position: Sitting, Cuff Size: Normal)   Pulse 81   Resp 20   Ht 5' 11.5" (1.816 m)   Wt 177 lb 12.8 oz (80.6 kg)   SpO2 99% Comment: RA  BMI 24.45 kg/m  Physical Exam Alert and responsive, more dynamic Lungs clear Heart rate regular no murmur or gallop Extremities warm without edema No focal motor deficit  Diagnostic Tests: None  Impression: Significant improvement in functional status since last visit a month ago. His cardiac  medications have been optimally titrated by Dr. Dulce SellarMunley. The patient would significant benefit from further extension of his home therapies including physical therapy especially.  Another 6 weeks of home physical therapy would be optimal. I would  continue Coumadin and continue surveillance of his rhythm.  If he maintains sinus rhythm for 6 months postop I would be comfortable with stopping the warfarin and transitioning to aspirin with the left atrial clip in place.  Plan: Return for follow-up of progress in mid January to assess rhythm and anticoagulation issues.   Mikey BussingPeter Van Trigt III, MD [facility] [phone]

## 2018-05-17 ENCOUNTER — Encounter: Payer: Self-pay | Admitting: Adult Health

## 2018-05-17 ENCOUNTER — Ambulatory Visit: Payer: Medicare Other | Admitting: Adult Health

## 2018-05-17 VITALS — BP 107/71 | HR 97 | Ht 71.5 in | Wt 178.4 lb

## 2018-05-17 DIAGNOSIS — I63411 Cerebral infarction due to embolism of right middle cerebral artery: Secondary | ICD-10-CM | POA: Diagnosis not present

## 2018-05-17 DIAGNOSIS — I639 Cerebral infarction, unspecified: Secondary | ICD-10-CM

## 2018-05-17 DIAGNOSIS — I10 Essential (primary) hypertension: Secondary | ICD-10-CM

## 2018-05-17 DIAGNOSIS — I480 Paroxysmal atrial fibrillation: Secondary | ICD-10-CM | POA: Diagnosis not present

## 2018-05-17 DIAGNOSIS — E785 Hyperlipidemia, unspecified: Secondary | ICD-10-CM

## 2018-05-17 NOTE — Progress Notes (Signed)
I agree with the above plan 

## 2018-05-17 NOTE — Patient Instructions (Signed)
Continue warfarin daily  and Pravastatin  for secondary stroke prevention  Continue to follow up with PCP regarding cholesterol and blood pressure management   Continue to follow up with cardiologist for coumadin management and routine monitoring   Continue to participate in PT/OT/ST for continued deficits. Important to continue to do exercises on your own.  If cognition continues to be an issues at follow up appointment, we can consider doing neurocognitive evaluation  Continue to monitor blood pressure at home  Maintain strict control of hypertension with blood pressure goal below 130/90, diabetes with hemoglobin A1c goal below 6.5% and cholesterol with LDL cholesterol (bad cholesterol) goal below 70 mg/dL. I also advised the patient to eat a healthy diet with plenty of whole grains, cereals, fruits and vegetables, exercise regularly and maintain ideal body weight.  Followup in the future with me in 3 months or call earlier if needed       Thank you for coming to see us at [facility]. I hope we have been able to provide you high quality care today.  You may receive a patient satisfaction survey over the next few weeks. We would appreciate your feedback and comments so that we may continue to improve ourselves and the health of our patients.

## 2018-05-17 NOTE — Progress Notes (Signed)
STROKE NEUROLOGY FOLLOW UP NOTE  NAME: Nathan [name]. DOB: [birth year]  REASON FOR VISIT: stroke follow up HISTORY FROM: Patient and chart and wife  Today we had the pleasure of seeing Nathan [name]. in follow-up at our Neurology Clinic. Pt was accompanied by wife.   History Summary Mr.Nathan [name] a 72 y.o.malewith DM and cardiomyopathy EF of 30% admitted on 04/28/17 for left-sidedweakness.He was taken to [facility] ER where he was givenIV TPA.He had a CTA showing a possibledistal M2 occlusion, with very mild symptoms. En route, he markedly worsened, after he arrived he was taken for a stat CT perfusion which demonstrates a large penumbra. He received thrombectomy for occluded right proximal M2.   MRI showed acute right frontal MCA infarct with petechial hemorrhage within the insular component.  MRA head concerning for right M2 stenosis vs. Motion artifact.  EF 30-35%.  No DVT.  LDL 51 and A1c 8.4. He was found to have new diagnosed A. fib RVR, cardiology consulted put on Cardizem drip and later discontinued.  Resumed Coreg and started Eliquis.  Discharge home with Eliquis and continue Zocor.  07/06/17 visit Dr. Roda Shutters: During the interval time, the patient has been doing well.  Follow with cardiologist and adjusted dose for Coreg and discontinued lisinopril due to orthostatic hypotension.  Currently only on Coreg 6.25 mg twice daily.  Plan to have coronary artery CT in 2 weeks for cardiac evaluation.  BP today 104/75 and sitting position.  Stated recent A1c 6.9 and his glucose at home still fluctuating.  Finished PT/OT, recovered well physically.  Discuss about repeat MRA for clear vessel picture, patient declined due to claustrophobia.  11/03/17 update: Patient returns today for follow-up appointment and is accompanied by his wife.  He feels as though he has made good clinical recovery as far as left-sided weakness but does have slight left-sided facial droop.  Does  have complaints of intermittent numbness and tingling on his left arm but this does not stop him from doing normal activities. Has short-term memory complaints that seem to worsen once he had his stroke but has not worsened since that time.  He continues to take Eliquis with mild bruising but no bleeding.  Continues to take Zocor without side effects of myalgias.  Patient has appointment with cardiologist on 12/07/2017 to discuss mitral valve replacement or repair.  Blood pressures at today's visit satisfactory at 138/65.  Continues to stay active and states for the most part, maintains eating a healthy diet.  Does admit to snoring, daytime fatigue, insomnia and frequent napping throughout the day.  Has been told previously by a different provider that he should undergo sleep apnea testing but is not done at this time.  Patient agreeable to undergo sleep apnea testing and referral placed.  Denies new or worsening stroke/TIA symptoms.  Interval history 05/17/2018: Patient being seen today for six-month follow-up visit and is accompanied by his wife.  Patient was admitted on 01/22/2018 for CABG x4 with bioprosthetic mitral valve replacement.  Preop course significant for a BLE, thrombocytopenia, fluid overload, HTN, and mental status changes with recurrent left-sided weakness on 01/27/2018 with CT head showing hypoattenuation in the right frontal region felt to be due to extension of right MCA infarct.  He did have bouts of intermittent AF.  He was discharged on warfarin.  Patient was transferred to [facility] for continued therapy and then discharged to [facility].  He has since returned home and has been participating in  home PT/OT/ST.  Continues to have cognitive deficits without noticeable improvement per wife.  He does typically have somebody staying with him for the majority of the day while his wife is at work.  Wife states he has been having a difficulty time with certain activities or takes longer to perform task.   He also has been ambulating with rolling walker and is eager to start ambulating independently.  Denies any recent falls.  Continues to take oropharynx without side effects of bleeding or bruising with stable INR levels.  Patient does state that he was informed by cardiology of possible discontinuation of warfarin beginning of next year.  Continues to take pravastatin without side effects myalgias.  Blood pressure today 107/71.  No further concerns at this time.  Denies new or worsening stroke/TIA symptoms.   REVIEW OF SYSTEMS: Full 14 system review of systems performed and notable only for those listed below and in HPI above, all others are negative: Frequency of urination and memory loss   The following represents the patient's updated allergies and side effects list: Allergies  Allergen Reactions  . Fentanyl Shortness Of Breath  . Promethazine Hcl Anaphylaxis and Other (See Comments)    Cardiac arrest  . Foeniculum Vulgare Other (See Comments)    Fennel bulbs--nausea only  . Metformin And Related Diarrhea  . Penicillins Nausea Only    Has patient had a PCN reaction causing immediate rash, facial/tongue/throat swelling, SOB or lightheadedness with hypotension: no Has patient had a PCN reaction causing severe rash involving mucus membranes or skin necrosis: no Has patient had a PCN reaction that required hospitalization: no Has patient had a PCN reaction occurring within the last 10 years: yes If all of the above answers are "NO", then may proceed with Cephalosporin use.   . Sulfa Antibiotics Other (See Comments)    G.IDorena Dew    The neurologically relevant items on the patient's problem list were reviewed on today's visit.  Neurologic Examination  A problem focused neurological exam (12 or more points of the single system neurologic examination, vital signs counts as 1 point, cranial nerves count for 8 points) was performed.  Blood pressure 107/71, pulse 97, height 5' 11.5" (1.816  m), weight 178 lb 6.4 oz (80.9 kg).  General - Well nourished, pleasant elderly Caucasian male, well developed, in no apparent distress.  Ophthalmologic - Sharp disc margins OU.   Cardiovascular - Regular rate and rhythm, not in afib.  Mental Status -  Level of arousal and orientation to time, place, and person were intact. Language including expression, naming, repetition, comprehension was assessed and found intact. Attention span and concentration were normal. Fund of Knowledge was assessed and was intact. Recall 1/3.  AFT 8.  Clock drawing 4/4 with prompting.  No concern with serial additions.  Cranial Nerves II - XII - II - Visual field intact OU. III, IV, VI - Extraocular movements intact. V - Facial sensation intact bilaterally. VII -mild left sided facial paralysis VIII - Hearing & vestibular intact bilaterally. X - Palate elevates symmetrically. XI - Chin turning & shoulder shrug intact bilaterally. XII - Tongue protrusion intact.  Motor Strength - The patient's strength was normal in all extremities and pronator drift was absent.  Bulk was normal and fasciculations were absent.   Motor Tone - Muscle tone was assessed at the neck and appendages and was normal.  Reflexes - The patient's reflexes were 1+ in all extremities and he had no pathological reflexes.  Sensory - Light  touch, temperature/pinprick, vibration and proprioception, and Romberg testing were assessed and were normal.    Coordination - The patient had normal movements in the hands and feet with no ataxia or dysmetria.  Tremor was absent.  Gait and Station - The patient's transfers, posture, gait, station, and turns were observed as normal with assistance of rolling walker    Data reviewed: I personally reviewed the images and agree with the radiology interpretations.  CT HEAD WO CONTRAST 01/27/2018 IMPRESSION: Interval development of hypo attenuation with loss of gray-white differentiation in the  posterior right frontal region, potentially related to evolution of previously noted right MCA infarct, but acute to subacute ischemia in this region a concern. MRI of the brain could be used to further evaluate. No evidence for associated acute hemorrhage.    Assessment: Mr Sande BrothersFerree is a 72 year old male with history of right frontal MCA infarct on 04/28/18 secondary to new diagnosed atrial fibrillation. Vascular risk factors include HTN, HLD, CAD and newly diagnosed a. Fib.  Patient underwent CABG procedure on 01/23/2018 and unfortunately developed extension of prior right MCA postop.  Patient has been recovering well without residual left-sided weakness but does have continued cognitive deficits.   Plan:  -Continue warfarin and pravastatin for secondary stroke prevention - follow up with cardiology for atrial fibrillation and warfarin management -Continue PT/OT/ST and highly encouraged doing homework provided by therapist -Continue to stay active and maintain a healthy diet - Follow up with your primary care physician for stroke risk factor modification. Recommend maintain blood pressure goal <130/80, diabetes with hemoglobin A1c goal below 7.0% and lipids with LDL cholesterol goal below 70 mg/dL.  - check BP and glucose at home. Avoid low BP     Follow up in 3 months or call earlier if needed  Greater than 50% time during this 25 minute consultation visit was spent on counseling and coordination of care about HLD, HTN, CAD and atrial fibrillation (risk factors), discussion about risk benefit of anticoagulation and answering questions.   George HughJessica Riniyah Speich, AGNP-BC  [facility] [address] [place], [place] [id number]  Phone [phone] Fax [phone]

## 2018-05-24 ENCOUNTER — Telehealth: Payer: Self-pay

## 2018-05-24 MED ORDER — SPIRONOLACTONE 25 MG PO TABS: 12.5000 mg | ORAL_TABLET | Freq: Every day | ORAL | 1 refills | Status: DC

## 2018-05-24 NOTE — Telephone Encounter (Signed)
Rx sent to pharmacy as requested.

## 2018-06-12 NOTE — Progress Notes (Signed)
Cardiology Office Note:    Date:  06/14/2018   ID:  Nathan Ito., DOB 1945-12-14, MRN [id number]  PCP:  Myrlene Broker, MD  Cardiologist:  Shirlee More, MD    Referring MD: Myrlene Broker, MD    ASSESSMENT:    1. Chronic systolic congestive heart failure ([place])   2. Hypertensive heart disease with heart failure ([place])   3. CAD in native artery   4. PAF (paroxysmal atrial fibrillation) ([place])   5. Ischemic cardiomyopathy   6. Atrial fibrillation with RVR ([place])   7. Chronic systolic heart failure ([place])   8. Hyperlipidemia, unspecified hyperlipidemia type    PLAN:    In order of problems listed above:  1. Improved fortunately is able to tolerate his guideline directed therapy and suboptimal doses we will recheck labs including renal function potassium proBNP and recheck echocardiogram after 3 months the middle of January make a decision whether ICD therapy should be considered.  I do not think I can uptitrate his medications today 2. Improve stable continue current beta-blocker MRA Entresto and loop diuretic 3. Stable CAD after revascularization continue medical treatment 4. Stable continue anticoagulation await input of CT surgery regarding discontinuation 5. Severely reduced EF continue current therapy recheck ejection fraction by echo and decision regarding ICD 6. Rate controlled continue beta-blocker 7. Continue statin check liver function lipid profile   Next appointment: 3 months   Medication Adjustments/Labs and Tests Ordered: Current medicines are reviewed at length with the patient today.  Concerns regarding medicines are outlined above.  Orders Placed This Encounter  Procedures  . Comp Met (CMET)  . Lipid Profile  . Pro b natriuretic peptide (BNP)  . ECHOCARDIOGRAM COMPLETE   No orders of the defined types were placed in this encounter.   Chief Complaint  Patient presents with  . Follow-up    after CABG and MV repair  . Congestive Heart  Failure  . Cardiomyopathy    History of Present Illness:    Nathan D Joshawa Dubin. is a 72 y.o. male with a hx of  CABG-mitral valve replacement-left atrial closure, heart failure left bundle branch block hypertensive heart disease and paroxysmal atrial fibrillation with post open heart surgery stroke last seen by me 04/28/2018.  Prior to that visit ejection fraction was 20% echocardiogram at 13% by [facility] at [facility] Compliance with diet, lifestyle and medications: Yes  He has had no intercurrent illness no complaints of edema shortness of breath chest pain palpitation or syncope just feels tired at times.  He is due for follow-up with CT surgery and I will raise the issue whether he should withdrawal warfarin as he has had occlusion of his left atrial appendage.  Appreciate input from Dr. Darcey Nora Past Medical History:  Diagnosis Date  . Atrial fibrillation with RVR ([place]) 05/01/2017  . Cardiomyopathy ([place]) 05/01/2017  . CHF (congestive heart failure) ([place])   . Chronic combined systolic and diastolic heart failure ([place]) 04/24/2015  . Coronary artery disease   . DM (diabetes mellitus) type 2, uncontrolled, with ketoacidosis ([place]) 05/01/2017  . Essential hypertension 11/26/2016  . History of kidney stones   . HLD (hyperlipidemia) 05/01/2017  . IVCD (intraventricular conduction defect) 04/24/2015  . Mitral valve regurgitation   . Mixed dyslipidemia 11/26/2016  . Postoperative urinary retention 02/23/2018  . Stroke (cerebrum) ([place]) 04/29/2017    Past Surgical History:  Procedure Laterality Date  . BRAIN SURGERY    . CARDIAC CATHETERIZATION    . CORONARY ARTERY  BYPASS GRAFT N/A 01/23/2018   Procedure: CORONARY ARTERY BYPASS GRAFTING (CABG) x 4 WITH ENDOSCOPIC HARVESTING OF RIGHT GREATER SAPHENOUS VEIN: LIMA TO LAD, SVG TO RCA, SVG TO DIAG, SVG TO OM ;  Surgeon: Ivin Poot, MD;  Location: [place];  Service: Open Heart Surgery;  Laterality: N/A;  . IR PERCUTANEOUS ART  THROMBECTOMY/INFUSION INTRACRANIAL INC DIAG ANGIO  04/29/2017  . IR RADIOLOGIST EVAL & MGMT  05/17/2017  . IR US GUIDE VASC ACCESS LEFT  04/29/2017  . IR US GUIDE VASC ACCESS RIGHT  04/29/2017  . LEFT ATRIAL APPENDAGE OCCLUSION Left 01/23/2018   Procedure: LEFT ATRIAL APPENDAGE OCCLUSION USING ATRICURE ATRICLIP PRO2 LAA EXCLUSION SYSTEM  SIZE 40, LOT # [id number], CAT # [id number], EXP. DATE 2020-04-28;  Surgeon: Prescott Gum, Collier Salina, MD;  Location: [place];  Service: Open Heart Surgery;  Laterality: Left;  . MITRAL VALVE REPLACEMENT N/A 01/23/2018   Procedure: MITRAL VALVE (MV) REPLACEMENT USING MAGNA MITRAL EASE PERICARDIAL BIOPROSTHESIS, MODEL 7300TFX, SIZE 29 MM, SERIAL # [id number], EXPIRATION  DATE 2021-03-03.;  Surgeon: Ivin Poot, MD;  Location: [place];  Service: Open Heart Surgery;  Laterality: N/A;  . MULTIPLE EXTRACTIONS WITH ALVEOLOPLASTY N/A 12/26/2017   Procedure: Extraction of tooth #'s 4,6-11, 18 -27, 30, and 31 with alveoloplasty;  Surgeon: Lenn Cal, DDS;  Location: [place];  Service: Oral Surgery;  Laterality: N/A;  . RADIOLOGY WITH ANESTHESIA N/A 04/29/2017   Procedure: IR WITH ANESTHESIA;  Surgeon: Radiologist, Medication, MD;  Location: [place];  Service: Radiology;  Laterality: N/A;  . RIGHT/LEFT HEART CATH AND CORONARY ANGIOGRAPHY N/A 08/18/2017   Procedure: RIGHT/LEFT HEART CATH AND CORONARY ANGIOGRAPHY;  Surgeon: Nelva Bush, MD;  Location: [place] CV LAB;  Service: Cardiovascular;  Laterality: N/A;  . TEE WITHOUT CARDIOVERSION N/A 09/22/2017   Procedure: TRANSESOPHAGEAL ECHOCARDIOGRAM (TEE);  Surgeon: Lelon Perla, MD;  Location[facility] ENDOSCOPY;  Service: Cardiovascular;  Laterality: N/A;  . TEE WITHOUT CARDIOVERSION N/A 01/23/2018   Procedure: TRANSESOPHAGEAL ECHOCARDIOGRAM (TEE);  Surgeon: Prescott Gum, Collier Salina, MD;  Location: [place];  Service: Open Heart Surgery;  Laterality: N/A;  . TONSILLECTOMY      Current Medications: Current Meds  Medication Sig  . bethanechol  (URECHOLINE) 50 MG tablet Take 50 mg by mouth 2 (two) times daily.  . carvedilol (COREG) 3.125 MG tablet Take 1 tablet (3.125 mg total) by mouth daily after supper.  . pantoprazole (PROTONIX) 40 MG tablet Take 1 tablet (40 mg total) by mouth daily.  . pravastatin (PRAVACHOL) 20 MG tablet Take 20 mg by mouth daily.  . sacubitril-valsartan (ENTRESTO) 24-26 MG Take 1 tablet by mouth daily with breakfast.  . spironolactone (ALDACTONE) 25 MG tablet Take 0.5 tablets (12.5 mg total) by mouth daily.  . tamsulosin (FLOMAX) 0.4 MG CAPS capsule Take 0.4 mg by mouth at bedtime.   [name] warfarin (COUMADIN) 1 MG tablet Take 0.5 mg by mouth daily at 2 PM.  . warfarin (COUMADIN) 3 MG tablet Take 3 mg by mouth daily.     Allergies:   Fentanyl; Promethazine hcl; Foeniculum vulgare; Metformin and related; Penicillins; and Sulfa antibiotics   Social History   Socioeconomic History  . Marital status: Married    Spouse name: Not on file  . Number of children: Not on file  . Years of education: Not on file  . Highest education level: Not on file  Occupational History  . Not on file  Social Needs  . Financial resource strain: Not on file  .  Food insecurity:    Worry: Not on file    Inability: Not on file  . Transportation needs:    Medical: Not on file    Non-medical: Not on file  Tobacco Use  . Smoking status: Former Smoker    Types: Cigarettes    Last attempt to quit: 1994    Years since quitting: 25.9  . Smokeless tobacco: Never Used  Substance and Sexual Activity  . Alcohol use: Yes    Alcohol/week: 3.0 standard drinks    Types: 1 Glasses of wine, 1 Cans of beer, 1 Standard drinks or equivalent per week    Comment: mix drink  . Drug use: No  . Sexual activity: Not on file  Lifestyle  . Physical activity:    Days per week: Not on file    Minutes per session: Not on file  . Stress: Not on file  Relationships  . Social connections:    Talks on phone: Not on file    Gets together: Not on file     Attends religious service: Not on file    Active member of club or organization: Not on file    Attends meetings of clubs or organizations: Not on file    Relationship status: Not on file  Other Topics Concern  . Not on file  Social History Narrative  . Not on file     Family History: The patient's family history includes Diabetes in his father; Hypertension in his father. ROS:   Please see the history of present illness.    All other systems reviewed and are negative.  EKGs/Labs/Other Studies Reviewed:    The following studies were reviewed today:    Recent Labs: 07/28/2017: TSH 2.140 01/24/2018: Magnesium 2.4 02/04/2018: ALT 18 02/21/2018: BUN 14; Creatinine, Ser 0.96; Potassium 4.4; Sodium 135 02/24/2018: Hemoglobin 10.5; Platelets 249  Recent Lipid Panel    Component Value Date/Time   CHOL 136 07/28/2017 1708   TRIG 101 07/28/2017 1708   HDL 34 (L) 07/28/2017 1708   CHOLHDL 4.0 07/28/2017 1708   CHOLHDL 3.4 04/29/2017 0451   VLDL 21 04/29/2017 0451   LDLCALC 82 07/28/2017 1708    Physical Exam:    VS:  BP 100/70 (BP Location: Right Arm, Patient Position: Sitting, Cuff Size: Normal)   Pulse 82   Ht 5' 11.5" (1.816 m)   Wt 170 lb 8 oz (77.3 kg)   SpO2 98%   BMI 23.45 kg/m     Wt Readings from Last 3 Encounters:  06/14/18 170 lb 8 oz (77.3 kg)  05/17/18 178 lb 6.4 oz (80.9 kg)  05/10/18 177 lb 12.8 oz (80.6 kg)     GEN: He looks much improved stronger skin is warm and dry unfortunately has been tolerating Entresto and beta-blocker well nourished, well developed in no acute distress HEENT: Normal NECK: No JVD; No carotid bruits LYMPHATICS: No lymphadenopathy CARDIAC: RRR, no murmurs, rubs, gallops RESPIRATORY:  Clear to auscultation without rales, wheezing or rhonchi  ABDOMEN: Soft, non-tender, non-distended MUSCULOSKELETAL:  No edema; No deformity  SKIN: Warm and dry NEUROLOGIC:  Alert and oriented x 3 PSYCHIATRIC:  Normal affect    Signed, Shirlee More, MD  06/14/2018 4:55 PM    [facility]

## 2018-06-14 ENCOUNTER — Ambulatory Visit (INDEPENDENT_AMBULATORY_CARE_PROVIDER_SITE_OTHER): Payer: Medicare Other | Admitting: Cardiology

## 2018-06-14 ENCOUNTER — Encounter: Payer: Self-pay | Admitting: Cardiology

## 2018-06-14 VITALS — BP 100/70 | HR 82 | Ht 71.5 in | Wt 170.5 lb

## 2018-06-14 DIAGNOSIS — I2510 Atherosclerotic heart disease of native coronary artery without angina pectoris: Secondary | ICD-10-CM | POA: Diagnosis not present

## 2018-06-14 DIAGNOSIS — I255 Ischemic cardiomyopathy: Secondary | ICD-10-CM

## 2018-06-14 DIAGNOSIS — I4891 Unspecified atrial fibrillation: Secondary | ICD-10-CM

## 2018-06-14 DIAGNOSIS — I110 Hypertensive heart disease with heart failure: Secondary | ICD-10-CM

## 2018-06-14 DIAGNOSIS — I480 Paroxysmal atrial fibrillation: Secondary | ICD-10-CM | POA: Diagnosis not present

## 2018-06-14 DIAGNOSIS — I5022 Chronic systolic (congestive) heart failure: Secondary | ICD-10-CM | POA: Diagnosis not present

## 2018-06-14 DIAGNOSIS — E785 Hyperlipidemia, unspecified: Secondary | ICD-10-CM

## 2018-06-14 NOTE — Patient Instructions (Signed)
Medication Instructions:  Your physician recommends that you continue on your current medications as directed. Please refer to the Current Medication list given to you today.  If you need a refill on your cardiac medications before your next appointment, please call your pharmacy.   Lab work: You will have lab work today:  Lipid, CMP, and proBNP If you have labs (blood work) drawn today and your tests are completely normal, you will receive your results only by: [name]. MyChart Message (if you have MyChart) OR . A paper copy in the mail If you have any lab test that is abnormal or we need to change your treatment, we will call you to review the results.  Testing/Procedures: Your physician has requested that you have an echocardiogram. Echocardiography is a painless test that uses sound waves to create images of your heart. It provides your doctor with information about the size and shape of your heart and how well your heart's chambers and valves are working. This procedure takes approximately one hour. There are no restrictions for this procedure.      Follow-Up: At [facility], you and your health needs are our priority.  As part of our continuing mission to provide you with exceptional heart care, we have created designated Provider Care Teams.  These Care Teams include your primary Cardiologist (physician) and Advanced Practice Providers (APPs -  Physician Assistants and Nurse Practitioners) who all work together to provide you with the care you need, when you need it. You will need a follow up appointment in 3 months.  Please call our office 2 months in advance to schedule this appointment.

## 2018-06-15 LAB — PRO B NATRIURETIC PEPTIDE: NT-PRO BNP: 817 pg/mL — AB (ref 0–376)

## 2018-06-15 LAB — LIPID PANEL
CHOL/HDL RATIO: 4.3 ratio (ref 0.0–5.0)
Cholesterol, Total: 156 mg/dL (ref 100–199)
HDL: 36 mg/dL — ABNORMAL LOW (ref >39)
LDL Calculated: 94 mg/dL (ref 0–99)
TRIGLYCERIDES: 131 mg/dL (ref 0–149)
VLDL Cholesterol Cal: 26 mg/dL (ref 5–40)

## 2018-06-15 LAB — COMPREHENSIVE METABOLIC PANEL
ALT: 25 IU/L (ref 0–44)
AST: 22 IU/L (ref 0–40)
Albumin/Globulin Ratio: 1.3 (ref 1.2–2.2)
Albumin: 3.9 g/dL (ref 3.5–4.8)
Alkaline Phosphatase: 120 IU/L — ABNORMAL HIGH (ref 39–117)
BILIRUBIN TOTAL: 0.4 mg/dL (ref 0.0–1.2)
BUN/Creatinine Ratio: 24 (ref 10–24)
BUN: 24 mg/dL (ref 8–27)
CALCIUM: 9.5 mg/dL (ref 8.6–10.2)
CO2: 24 mmol/L (ref 20–29)
Chloride: 96 mmol/L (ref 96–106)
Creatinine, Ser: 1 mg/dL (ref 0.76–1.27)
GFR calc Af Amer: 88 mL/min/{1.73_m2} (ref >59)
GFR, EST NON AFRICAN AMERICAN: 76 mL/min/{1.73_m2} (ref >59)
Globulin, Total: 3.1 g/dL (ref 1.5–4.5)
Glucose: 176 mg/dL — ABNORMAL HIGH (ref 65–99)
Potassium: 4.9 mmol/L (ref 3.5–5.2)
SODIUM: 133 mmol/L — AB (ref 134–144)
Total Protein: 7 g/dL (ref 6.0–8.5)

## 2018-06-26 ENCOUNTER — Telehealth: Payer: Self-pay | Admitting: Cardiology

## 2018-06-26 NOTE — Telephone Encounter (Signed)
Nathan BachCecil needs to know if this patient can attend cardiac rehab.

## 2018-06-27 NOTE — Telephone Encounter (Signed)
yes

## 2018-06-29 NOTE — Telephone Encounter (Signed)
Nathan [name] informed that Dr. Dulce Sellar approved patient be enrolled in cardiac rehab on Tuesday, 06/27/18. Called patient to inform him and explain the cardiac rehab program to him. Patient verbalized understanding and is agreeable to plan. No further questions.

## 2018-07-05 ENCOUNTER — Ambulatory Visit: Payer: Medicare Other | Admitting: Sports Medicine

## 2018-07-06 DIAGNOSIS — I5022 Chronic systolic (congestive) heart failure: Secondary | ICD-10-CM | POA: Insufficient documentation

## 2018-07-06 DIAGNOSIS — K219 Gastro-esophageal reflux disease without esophagitis: Secondary | ICD-10-CM

## 2018-07-06 HISTORY — DX: Gastro-esophageal reflux disease without esophagitis: K21.9

## 2018-07-06 HISTORY — DX: Chronic systolic (congestive) heart failure: I50.22

## 2018-07-17 ENCOUNTER — Other Ambulatory Visit: Payer: Self-pay

## 2018-07-17 ENCOUNTER — Encounter: Payer: Self-pay | Admitting: Cardiothoracic Surgery

## 2018-07-17 ENCOUNTER — Ambulatory Visit: Payer: Medicare Other | Admitting: Cardiothoracic Surgery

## 2018-07-17 VITALS — BP 90/60 | HR 88 | Resp 16 | Ht 71.5 in | Wt 172.8 lb

## 2018-07-17 DIAGNOSIS — Z09 Encounter for follow-up examination after completed treatment for conditions other than malignant neoplasm: Secondary | ICD-10-CM | POA: Diagnosis not present

## 2018-07-17 DIAGNOSIS — Z951 Presence of aortocoronary bypass graft: Secondary | ICD-10-CM | POA: Diagnosis not present

## 2018-07-17 DIAGNOSIS — Z952 Presence of prosthetic heart valve: Secondary | ICD-10-CM | POA: Diagnosis not present

## 2018-07-17 NOTE — Progress Notes (Signed)
PCP is Hadley Penobbins, Robert A, MD Referring Provider is End, Cristal Deerhristopher, MD  Chief Complaint  Patient presents with  . Routine Post Op    f/u s/p CABG X 4/MVR 06/16/18 with a CXR    HPI: Patient returns for scheduled 4020-month postop follow-up after undergoing CABG x4 and mitral valve replacement with a 29 mm tissue pericardial valve July 2019.  He also had left atrial clipping for history of preoperative atrial fibrillation.  Patient had a previous right cerebral embolic stroke from his atrial fibrillation.  Postop he has had recurrent left-sided weakness from probable extension of that previous stroke which has prolonged his recovery.  However he is now significantly improved and more independent.  He walks with a rolling walker fairly good distances.  He has finished home physical therapy and is transitioning [facility].  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  He is not yet at a position to drive.  The patient is scheduled to undergo follow-up echocardiogram later this month to assess ejection fraction and the need for AICD.   The patient appears to have maintained sinus rhythm for the past 6 months however I told the patient and wife I would be hesitant to stop Coumadin and expose him to increased risks of stroke which would be devastating after he is starting to make significant improvement in his recovery.  Past Medical History:  Diagnosis Date  . Atrial fibrillation with RVR (HCC) 05/01/2017  . Cardiomyopathy (H[facility]) 05/01/2017  . CHF (congestive heart failure) (H[facility])   . 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 Coronary artery disease   . DM (diabetes mellitus) type 2, uncontrolled, with ketoacidosis (H[facility]) 05/01/2017  . Essential hypertension 11/26/2016  . History of kidney stones   . HLD (hyperlipidemia) 05/01/2017  . IVCD (intraventricular conduction defect) 04/24/2015  .  Mitral valve regurgitation   . Mixed dyslipidemia 11/26/2016  . Postoperative urinary retention 02/23/2018  . 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 BRAIN SURGERY    . CARDIAC CATHETERIZATION    . CORONARY ARTERY BYPASS GRAFT N/A 01/23/2018   Procedure: CORONARY ARTERY BYPASS GRAFTING (CABG) x 4 WITH ENDOSCOPIC HARVESTING OF RIGHT GREATER SAPHENOUS VEIN: LIMA TO LAD, SVG TO RCA, SVG TO DIAG, SVG TO OM ;  Surgeon: [name], Peter, MD;  [facility];  Service: Open Heart Surgery;  Laterality: N/A;  . IR PERCUTANEOUS ART THROMBECTOMY/INFUSION INTRACRANIAL INC DIAG ANGIO  04/29/2017  . IR RADIOLOGIST EVAL & MGMT  05/17/2017  . IR US GUIDE VASC ACCESS LEFT  04/29/2017  . IR US GUIDE VASC ACCESS RIGHT  04/29/2017  . LEFT ATRIAL APPENDAGE OCCLUSION Left 01/23/2018   Procedure: LEFT ATRIAL APPENDAGE OCCLUSION USING ATRICURE ATRICLIP PRO2 LAA EXCLUSION SYSTEM  SIZE 40, LOT # [id number], CAT # [id number], EXP. DATE 2020-04-28;  Surgeon: Donata ClayVan Trigt, Theron AristaPeter, MD;  [facility];  Service: Open Heart Surgery;  Laterality: Left;  . MITRAL VALVE REPLACEMENT N/A 01/23/2018   Procedure: MITRAL VALVE (MV) REPLACEMENT USING MAGNA MITRAL EASE PERICARDIAL BIOPROSTHESIS, MODEL 7300TFX, SIZE 29 MM, SERIAL # [id number], EXPIRATION  DATE 2021-03-03.;  Surgeon: [name], Peter, MD;  [facility];  Service: Open Heart Surgery;  Laterality: N/A;  . MULTIPLE EXTRACTIONS WITH ALVEOLOPLASTY N/A 12/26/2017   Procedure: Extraction of tooth #'s 4,6-11, 18 -27, 30, and 31 with alveoloplasty;  Surgeon:  Charlynne PanderKulinski, Ronald F, DDS;  [facility];  Service: Oral Surgery;  Laterality: N/A;  . RADIOLOGY WITH ANESTHESIA N/A 04/29/2017   Procedure: IR WITH ANESTHESIA;  Surgeon: Radiologist, Medication, MD;  [facility];  Service: Radiology;  Laterality: N/A;  . RIGHT/LEFT HEART CATH AND CORONARY ANGIOGRAPHY N/A 08/18/2017   Procedure: RIGHT/LEFT HEART CATH AND CORONARY ANGIOGRAPHY;  Surgeon: Yvonne KendallEnd,  Christopher, MD;  [facility] INVASIVE CV LAB;  Service: Cardiovascular;  Laterality: N/A;  . TEE WITHOUT CARDIOVERSION N/A 09/22/2017   Procedure: TRANSESOPHAGEAL ECHOCARDIOGRAM (TEE);  Surgeon: Lewayne Buntingrenshaw, Brian S, MD;  [facility];  Service: Cardiovascular;  Laterality: N/A;  . TEE WITHOUT CARDIOVERSION N/A 01/23/2018   Procedure: TRANSESOPHAGEAL ECHOCARDIOGRAM (TEE);  Surgeon: Donata ClayVan Trigt, Theron AristaPeter, MD;  [facility];  Service: Open Heart Surgery;  Laterality: N/A;  . TONSILLECTOMY      Family History  Problem Relation Age of Onset  . Hypertension Father   . Diabetes Father     Social History Social History   Tobacco Use  . Smoking status: Former Smoker    Types: Cigarettes    Last attempt to quit: 1994    Years since quitting: 26.0  . Smokeless tobacco: Never Used  Substance Use Topics  . Alcohol use: Yes    Alcohol/week: 3.0 standard drinks    Types: 1 Glasses of wine, 1 Cans of beer, 1 Standard drinks or equivalent per week    Comment: mix drink  . Drug use: No    Current Outpatient Medications  Medication Sig Dispense Refill  . bethanechol (URECHOLINE) 50 MG tablet Take 50 mg by mouth 2 (two) times daily.    . carvedilol (COREG) 3.125 MG tablet Take 1 tablet (3.125 mg total) by mouth daily after supper. 30 tablet 3  . pantoprazole (PROTONIX) 40 MG tablet Take 1 tablet (40 mg total) by mouth daily.    . pravastatin (PRAVACHOL) 20 MG tablet Take 20 mg by mouth daily.    . sacubitril-valsartan (ENTRESTO) 24-26 MG Take 1 tablet by mouth daily with breakfast. 30 tablet 3  . spironolactone (ALDACTONE) 25 MG tablet Take 0.5 tablets (12.5 mg total) by mouth daily. 15 tablet 1  . tamsulosin (FLOMAX) 0.4 MG CAPS capsule Take 0.4 mg by mouth at bedtime.   3  . warfarin (COUMADIN) 1 MG tablet Take 0.5 mg by mouth daily at 2 PM.    . warfarin (COUMADIN) 3 MG tablet Take 3 mg by mouth daily.     No current facility-administered medications for this visit.     Allergies   Allergen Reactions  . Fentanyl Shortness Of Breath  . Promethazine Hcl Anaphylaxis and Other (See Comments)    Cardiac arrest  . Foeniculum Vulgare Other (See Comments)    Fennel bulbs--nausea only  . Metformin And Related Diarrhea  . Penicillins Nausea Only    Has patient had a PCN reaction causing immediate rash, facial/tongue/throat swelling, SOB or lightheadedness with hypotension: no Has patient had a PCN reaction causing severe rash involving mucus membranes or skin necrosis: no Has patient had a PCN reaction that required hospitalization: no Has patient had a PCN reaction occurring within the last 10 years: yes If all of the above answers are "NO", then may proceed with Cephalosporin use.   . Sulfa Antibiotics Other (See Comments)    G.I. Upset    Review of Systems  Weight stable No falls No edema No symptoms of CHF Residual weakness of left leg  BP 90/60 (BP Location:  Left Arm, Patient Position: Sitting, Cuff Size: Normal) Comment: manually  Pulse 88   Resp 16   Ht 5' 11.5" (1.816 m)   Wt 172 lb 12.8 oz (78.4 kg)   SpO2 99% Comment: on ra  BMI 23.76 kg/m  Physical Exam      Exam    General- alert and comfortable    Neck- no JVD, no cervical adenopathy palpable, no carotid bruit   Lungs- clear without rales, wheezes   Cor- regular rate and rhythm, no murmur , gallop   Abdomen- soft, non-tender   Extremities - warm, non-tender, minimal edema   Neuro- oriented, appropriate, no focal weakness  Diagnostic Tests: None  Impression: Continued improvement after combined MVR-CABG for ischemic cardiomyopathy, ejection fraction 25%.  Maintaining sinus rhythm. Transitioning to hospital-based cardiac rehab phase 2. Plan: Return in 4 months for reassessment of progress.   Mikey Bussing, MD [facility] [phone]

## 2018-07-19 ENCOUNTER — Ambulatory Visit: Payer: Medicare Other | Admitting: Cardiothoracic Surgery

## 2018-07-21 ENCOUNTER — Other Ambulatory Visit: Payer: Self-pay | Admitting: Cardiology

## 2018-07-21 MED ORDER — SPIRONOLACTONE 25 MG PO TABS: 12.5000 mg | ORAL_TABLET | Freq: Every day | ORAL | 3 refills | Status: DC

## 2018-07-21 NOTE — Telephone Encounter (Signed)
°*  STAT* If patient is at the pharmacy, call can be transferred to refill team.   1. Which medications need to be refilled? (please list name of each medication and dose if known) Spiranalactone 25mg  takes 1/2 tablet daily   2. Which pharmacy/location (including street and city if local pharmacy) is medication to be sent to?[facility] on [address]  3. Do they need a 30 day or 90 day supply? 90   Patient is completely out please call today!!

## 2018-07-25 ENCOUNTER — Ambulatory Visit (INDEPENDENT_AMBULATORY_CARE_PROVIDER_SITE_OTHER): Payer: Medicare Other

## 2018-07-25 ENCOUNTER — Other Ambulatory Visit: Payer: Self-pay

## 2018-07-25 DIAGNOSIS — I255 Ischemic cardiomyopathy: Secondary | ICD-10-CM

## 2018-07-25 DIAGNOSIS — I4891 Unspecified atrial fibrillation: Secondary | ICD-10-CM

## 2018-07-25 DIAGNOSIS — I480 Paroxysmal atrial fibrillation: Secondary | ICD-10-CM | POA: Diagnosis not present

## 2018-07-25 DIAGNOSIS — I2510 Atherosclerotic heart disease of native coronary artery without angina pectoris: Secondary | ICD-10-CM

## 2018-07-25 DIAGNOSIS — I5022 Chronic systolic (congestive) heart failure: Secondary | ICD-10-CM | POA: Diagnosis not present

## 2018-07-25 DIAGNOSIS — I110 Hypertensive heart disease with heart failure: Secondary | ICD-10-CM | POA: Diagnosis not present

## 2018-07-26 ENCOUNTER — Telehealth: Payer: Self-pay | Admitting: *Deleted

## 2018-07-26 DIAGNOSIS — I5022 Chronic systolic (congestive) heart failure: Secondary | ICD-10-CM

## 2018-07-26 NOTE — Addendum Note (Signed)
Addended by: Dow Blahnik P on: 06/06/2019 03:52 PM   Modules accepted: Orders  

## 2018-07-26 NOTE — Telephone Encounter (Signed)
-----   Message from Baldo DaubBrian J Munley, MD sent at 07/26/2018  1:46 PM EST ----- Please call his wife  Unfortunately unchanged severely reduced function  Should see WC re ICD  They are aware of this plan

## 2018-07-26 NOTE — Telephone Encounter (Signed)
Attempted to contact patient's wife, Cordelia Pen, per DPR on home phone with no answer, unable to leave message. Left a voicemail on her cell phone to return call to discuss echocardiogram results.

## 2018-07-26 NOTE — Progress Notes (Signed)
Complete echocardiogram has been performed.  Jimmy Zahra Peffley RDCS, RVT 

## 2018-07-26 NOTE — Telephone Encounter (Signed)
Informed patient's wife, Cordelia Pen, of echocardiogram results and advised that Dr. Dulce Sellar recommends patient be seen by Dr. Elberta Fortis with electrophysiology to discuss ICD placement due to reduced ejection fraction. Cordelia Pen is agreeable and is willing to take patient to the [place] office to be seen sooner than the [place] location. She prefers a late afternoon appointment if possible.   Will check with Dory Horn, RN regarding scheduling and update [facility] accordingly. Cordelia Pen verbalized understanding. No further questions.

## 2018-07-31 ENCOUNTER — Encounter: Payer: Self-pay | Admitting: *Deleted

## 2018-08-07 ENCOUNTER — Encounter: Payer: Self-pay | Admitting: Cardiology

## 2018-08-07 ENCOUNTER — Ambulatory Visit (INDEPENDENT_AMBULATORY_CARE_PROVIDER_SITE_OTHER): Payer: Medicare Other | Admitting: Cardiology

## 2018-08-07 VITALS — BP 118/62 | HR 84 | Ht 71.0 in | Wt 175.0 lb

## 2018-08-07 DIAGNOSIS — I5022 Chronic systolic (congestive) heart failure: Secondary | ICD-10-CM | POA: Diagnosis not present

## 2018-08-07 NOTE — Progress Notes (Signed)
Electrophysiology Office Note   Date:  08/09/2018   ID:  Nathan Tooke., DOB Nov 03, 1945, MRN [id number]  PCP:  [name], MD  Cardiologist:  Dulce Sellar Primary Electrophysiologist:  Evetta Renner Jorja Loa, MD    No chief complaint on file.    History of Present Illness: Nathan D Kahn Ledon. is a 73 y.o. male who is being seen today for the evaluation of CHF at the request of Dulce Sellar, Iline Oven, MD. Presenting today for electrophysiology evaluation.  Has a history of chronic systolic heart failure, hypertension, coronary artery disease, paroxysmal atrial fibrillation, hyperlipidemia.  He is also status post MVR/CABG for ischemic cardiomyopathy.  Presenting for initial visit for possible ICD implant.    Today, he denies symptoms of palpitations, chest pain, shortness of breath, orthopnea, PND, lower extremity edema, claudication, dizziness, presyncope, syncope, bleeding, or neurologic sequela. The patient is tolerating medications without difficulties. He currently feels well today with some SOB but no chest pain. He is unsure about ICD implant.   Past Medical History:  Diagnosis Date  . Atrial fibrillation with RVR (HCC) 05/01/2017  . Cardiomyopathy (HCC) 05/01/2017  . CHF (congestive heart failure) (HCC)   . 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 Coronary artery disease   . DM (diabetes mellitus) type 2, uncontrolled, with ketoacidosis (HCC) 05/01/2017  . Essential hypertension 11/26/2016  . History of kidney stones   . HLD (hyperlipidemia) 05/01/2017  . IVCD (intraventricular conduction defect) 04/24/2015  . Mitral valve regurgitation   . Mixed dyslipidemia 11/26/2016  . Postoperative urinary retention 02/23/2018  . 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 BRAIN SURGERY    . CARDIAC CATHETERIZATION    . CORONARY ARTERY BYPASS GRAFT N/A 01/23/2018   Procedure: CORONARY ARTERY BYPASS GRAFTING  (CABG) x 4 WITH ENDOSCOPIC HARVESTING OF RIGHT GREATER SAPHENOUS VEIN: LIMA TO LAD, SVG TO RCA, SVG TO DIAG, SVG TO OM ;  Surgeon: Kerin Perna, MD;  Location: [facility] OR;  Service: Open Heart Surgery;  Laterality: N/A;  . IR PERCUTANEOUS ART THROMBECTOMY/INFUSION INTRACRANIAL INC DIAG ANGIO  04/29/2017  . IR RADIOLOGIST EVAL & MGMT  05/17/2017  . IR US GUIDE VASC ACCESS LEFT  04/29/2017  . IR US GUIDE VASC ACCESS RIGHT  04/29/2017  . LEFT ATRIAL APPENDAGE OCCLUSION Left 01/23/2018   Procedure: LEFT ATRIAL APPENDAGE OCCLUSION USING ATRICURE ATRICLIP PRO2 LAA EXCLUSION SYSTEM  SIZE 40, LOT # [id number], CAT # [id number], EXP. DATE 2020-04-28;  Surgeon: Donata Clay, Theron Arista, MD;  Location: [facility] OR;  Service: Open Heart Surgery;  Laterality: Left;  . MITRAL VALVE REPLACEMENT N/A 01/23/2018   Procedure: MITRAL VALVE (MV) REPLACEMENT USING MAGNA MITRAL EASE PERICARDIAL BIOPROSTHESIS, MODEL [id number], SIZE 29 MM, SERIAL # [id number], EXPIRATION  DATE 2021-03-03.;  Surgeon: Kerin Perna, MD;  Location: [facility] OR;  Service: Open Heart Surgery;  Laterality: N/A;  . MULTIPLE EXTRACTIONS WITH ALVEOLOPLASTY N/A 12/26/2017   Procedure: Extraction of tooth #'s 4,6-11, 18 -27, 30, and 31 with alveoloplasty;  Surgeon: [name], DDS;  Location: [facility] OR;  Service: Oral Surgery;  Laterality: N/A;  . RADIOLOGY WITH ANESTHESIA N/A 04/29/2017   Procedure: IR WITH ANESTHESIA;  Surgeon: Radiologist, Medication, MD;  Location: [facility] OR;  Service: Radiology;  Laterality: N/A;  . RIGHT/LEFT HEART CATH AND CORONARY ANGIOGRAPHY N/A 08/18/2017   Procedure: RIGHT/LEFT HEART CATH AND CORONARY ANGIOGRAPHY;  Surgeon: [name], MD;  Location: [facility] INVASIVE CV LAB;  Service: Cardiovascular;  Laterality: N/A;  . TEE WITHOUT CARDIOVERSION N/A 09/22/2017   Procedure: TRANSESOPHAGEAL ECHOCARDIOGRAM (TEE);  Surgeon: Lewayne Buntingrenshaw, Brian S, MD;  [facility];  Service: Cardiovascular;  Laterality: N/A;  . TEE WITHOUT CARDIOVERSION N/A 01/23/2018    Procedure: TRANSESOPHAGEAL ECHOCARDIOGRAM (TEE);  Surgeon: Donata ClayVan Trigt, Theron AristaPeter, MD;  [facility];  Service: Open Heart Surgery;  Laterality: N/A;  . TONSILLECTOMY       Current Outpatient Medications  Medication Sig Dispense Refill  . bethanechol (URECHOLINE) 50 MG tablet Take 50 mg by mouth 2 (two) times daily.    . carvedilol (COREG) 3.125 MG tablet Take 1 tablet (3.125 mg total) by mouth daily after supper. 30 tablet 3  . pantoprazole (PROTONIX) 40 MG tablet Take 1 tablet (40 mg total) by mouth daily.    . pravastatin (PRAVACHOL) 20 MG tablet Take 20 mg by mouth daily.    . sacubitril-valsartan (ENTRESTO) 24-26 MG Take 1 tablet by mouth daily with breakfast. 30 tablet 3  . spironolactone (ALDACTONE) 25 MG tablet Take 0.5 tablets (12.5 mg total) by mouth daily. 15 tablet 3  . tamsulosin (FLOMAX) 0.4 MG CAPS capsule Take 0.4 mg by mouth at bedtime.   3  . warfarin (COUMADIN) 1 MG tablet Take 0.5 mg by mouth daily at 2 PM.    . warfarin (COUMADIN) 3 MG tablet Take 3 mg by mouth daily.     No current facility-administered medications for this visit.     Allergies:   Fentanyl; Promethazine hcl; Foeniculum vulgare; Metformin and related; Penicillins; and Sulfa antibiotics   Social History:  The patient  reports that he quit smoking about 26 years ago. His smoking use included cigarettes. He has never used smokeless tobacco. He reports current alcohol use of about 3.0 standard drinks of alcohol per week. He reports that he does not use drugs.   Family History:  The patient's family history includes Diabetes in his father; Hypertension in his father.    ROS:  Please see the history of present illness.   Otherwise, review of systems is positive for none.   All other systems are reviewed and negative.    PHYSICAL EXAM: VS:  BP 118/62   Pulse 84   Ht 5\' 11"  (1.803 m)   Wt 175 lb (79.4 kg)   BMI 24.41 kg/m  , BMI Body mass index is 24.41 kg/m. GEN: Well nourished, well developed, in no  acute distress  HEENT: normal  Neck: no JVD, carotid bruits, or masses Cardiac: RRR; no murmurs, rubs, or gallops,no edema  Respiratory:  clear to auscultation bilaterally, normal work of breathing GI: soft, nontender, nondistended, + BS MS: no deformity or atrophy  Skin: warm and dry Neuro:  Strength and sensation are intact Psych: euthymic mood, full affect  EKG:  EKG is ordered today. Personal review of the ekg ordered shows SR, LVH  Recent Labs: 01/24/2018: Magnesium 2.4 02/24/2018: Hemoglobin 10.5; Platelets 249 06/14/2018: ALT 25; BUN 24; Creatinine, Ser 1.00; NT-Pro BNP 817; Potassium 4.9; Sodium 133    Lipid Panel     Component Value Date/Time   CHOL 156 06/14/2018 1645   TRIG 131 06/14/2018 1645   HDL 36 (L) 06/14/2018 1645   CHOLHDL 4.3 06/14/2018 1645   CHOLHDL 3.4 04/29/2017 0451   VLDL 21 04/29/2017 0451   LDLCALC 94 06/14/2018 1645     Wt Readings from Last 3 Encounters:  08/07/18 175 lb (79.4 kg)  07/17/18 172 lb 12.8 oz (78.4 kg)  06/14/18 170 lb 8 oz (77.3 kg)      Other studies Reviewed: Additional studies/ records that were reviewed today include: TTE 07/25/18  Review of the above records today demonstrates:  - Left ventricle: The cavity size was severely dilated. Wall   thickness was increased in a pattern of mild LVH. Systolic   function was normal. The estimated ejection fraction was in the   range of 15% to 20%. - Mitral valve: Calcified annulus. A prosthesis was present and   functioning normally. The prosthesis had a normal range of   motion. The sewing ring appeared normal, had no rocking motion,   and showed no evidence of dehiscence. Valve area by continuity   equation (using LVOT flow): 1.31 cm^2. - Left atrium: The atrium was mildly dilated.   ASSESSMENT AND PLAN:  1.  Chronic systolic heart failure due to ischemic cardiomyopathy: Currently on optimal medical therapy with Aldactone, Entresto, and Coreg. He would qualify for ICD  implant. Risks and benefits discussed. At this point, the patient is unsure about his willingness to proceed. He Nathan [name] consider his options and get back to Nathan [name] at the next visit or before if he makes up his mind. Have given him information about ICD.  2.  Coronary artery disease status post CABG: no current CP.  3.  Mitral valve replacement due to severe mitral regurgitation: no symptoms of SOB. Plan per primary cardiology.   4.  Paroxysmal atrial fibrillation: Currently on warfarin.  This patients CHA2DS2-VASc Score and unadjusted Ischemic Stroke Rate (% per year) is equal to 3.2 % stroke rate/year from a score of 3  Above score calculated as 1 point each if present [CHF, HTN, DM, Vascular=MI/PAD/Aortic Plaque, Age if 65-74, or Male] Above score calculated as 2 points each if present [Age > 75, or Stroke/TIA/TE]     Current medicines are reviewed at length with the patient today.   The patient does not have concerns regarding his medicines.  The following changes were made today:  none  Labs/ tests ordered today include:  Orders Placed This Encounter  Procedures  . EKG 12-Lead   Case discussed with primary cardiology.  Disposition:   FU with Nathan [name] 3 months  Signed, Phineas Mcenroe Jorja LoaMartin Kynisha Memon, MD  08/09/2018 7:35 AM     [facility] [address] [place] [address] [phone] (office) [phone] (fax)

## 2018-08-07 NOTE — Patient Instructions (Signed)
Medication Instructions:  Your physician recommends that you continue on your current medications as directed. Please refer to the Current Medication list given to you today.  * If you need a refill on your cardiac medications before your next appointment, please call your pharmacy.   Labwork: None ordered  Testing/Procedures: None ordered  Follow-Up: Your physician recommends that you schedule a follow-up appointment in: 3 months with Dr. Elberta Fortisamnitz.  Thank you for choosing [facility]!!   Dory HornSherri Adi Doro, RN [phone]  Any Other Special Instructions Will Be Listed Below (If Applicable).    Cardioverter Defibrillator Implantation  An implantable cardioverter defibrillator (ICD) is a small device that is placed under the skin in the chest or abdomen. An ICD consists of a battery, a small computer (pulse generator), and wires (leads) that go into the heart. An ICD is used to detect and correct two types of dangerous irregular heartbeats (arrhythmias):  A rapid heart rhythm (tachycardia).  An arrhythmia in which the lower chambers of the heart (ventricles) contract in an uncoordinated way (fibrillation). When an ICD detects tachycardia, it sends a low-energy shock to the heart to restore the heartbeat to normal (cardioversion). This signal is usually painless. If cardioversion does not work or if the ICD detects fibrillation, it delivers a high-energy shock to the heart (defibrillation) to restart the heart. This shock may feel like a strong jolt in the chest. Your health care provider may prescribe an ICD if:  You have had an arrhythmia that originated in the ventricles.  Your heart has been damaged by a disease or heart condition. Sometimes, ICDs are programmed to act as a device called a pacemaker. Pacemakers can be used to treat a slow heartbeat (bradycardia) or tachycardia by taking over the heart rate with electrical impulses. Tell a health care provider about:  Any  allergies you have.  All medicines you are taking, including vitamins, herbs, eye drops, creams, and over-the-counter medicines.  Any problems you or family members have had with anesthetic medicines.  Any blood disorders you have.  Any surgeries you have had.  Any medical conditions you have.  Whether you are pregnant or may be pregnant. What are the risks? Generally, this is a safe procedure. However, problems may occur, including:  Swelling, bleeding, or bruising.  Infection.  Blood clots.  Damage to other structures or organs, such as nerves, blood vessels, or the heart.  Allergic reactions to medicines used during the procedure. What happens before the procedure? Staying hydrated Follow instructions from your health care provider about hydration, which may include:  Up to 2 hours before the procedure - you may continue to drink clear liquids, such as water, clear fruit juice, black coffee, and plain tea. Eating and drinking restrictions Follow instructions from your health care provider about eating and drinking, which may include:  8 hours before the procedure - stop eating heavy meals or foods such as meat, fried foods, or fatty foods.  6 hours before the procedure - stop eating light meals or foods, such as toast or cereal.  6 hours before the procedure - stop drinking milk or drinks that contain milk.  2 hours before the procedure - stop drinking clear liquids. Medicine Ask your health care provider about:  Changing or stopping your normal medicines. This is important if you take diabetes medicines or blood thinners.  Taking medicines such as aspirin and ibuprofen. These medicines can thin your blood. Do not take these medicines before your procedure if your doctor  tells you not to. Tests  You may have blood tests.  You may have a test to check the electrical signals in your heart (electrocardiogram, ECG).  You may have imaging tests, such as a chest  X-ray. General instructions  For 24 hours before the procedure, stop using products that contain nicotine or tobacco, such as cigarettes and e-cigarettes. If you need help quitting, ask your health care provider.  Plan to have someone take you home from the hospital or clinic.  You may be asked to shower with a germ-killing soap. What happens during the procedure?  To reduce your risk of infection: ? Your health care team will wash or sanitize their hands. ? Your skin will be washed with soap. ? Hair may be removed from the surgical area.  Small monitors will be put on your body. They will be used to check your heart, blood pressure, and oxygen level.  An IV tube will be inserted into one of your veins.  You will be given one or more of the following: ? A medicine to help you relax (sedative). ? A medicine to numb the area (local anesthetic). ? A medicine to make you fall asleep (general anesthetic).  Leads will be guided through a blood vessel into your heart and attached to your heart muscles. Depending on the ICD, the leads may go into one ventricle or they may go into both ventricles and into an upper chamber of the heart. An X-ray machine (fluoroscope) will be usedto help guide the leads.  A small incision will be made to create a deep pocket under your skin.  The pulse generator will be placed into the pocket.  The ICD will be tested.  The incision will be closed with stitches (sutures), skin glue, or staples.  A bandage (dressing) will be placed over the incision. This procedure may vary among health care providers and hospitals. What happens after the procedure?  Your blood pressure, heart rate, breathing rate, and blood oxygen level will be monitored often until the medicines you were given have worn off.  A chest X-ray will be taken to check that the ICD is in the right place.  You will need to stay in the hospital for 1-2 days so your health care provider can make  sure your ICD is working.  Do not drive for 24 hours if you received a sedative. Ask your health care provider when it is safe for you to drive.  You may be given an identification card explaining that you have an ICD. Summary  An implantable cardioverter defibrillator (ICD) is a small device that is placed under the skin in the chest or abdomen. It is used to detect and correct dangerous irregular heartbeats (arrhythmias).  An ICD consists of a battery, a small computer (pulse generator), and wires (leads) that go into the heart.  When an ICD detects rapid heart rhythm (tachycardia), it sends a low-energy shock to the heart to restore the heartbeat to normal (cardioversion). If cardioversion does not work or if the ICD detects uncoordinated heart contractions (fibrillation), it delivers a high-energy shock to the heart (defibrillation) to restart the heart.  You will need to stay in the hospital for 1-2 days to make sure your ICD is working. This information is not intended to replace advice given to you by your health care provider. Make sure you discuss any questions you have with your health care provider. Document Released: 03/06/2002 Document Revised: 06/23/2016 Document Reviewed: 06/23/2016 Elsevier Interactive  Patient Education  2019 [employer].

## 2018-08-09 ENCOUNTER — Encounter: Payer: Self-pay | Admitting: Cardiology

## 2018-08-21 ENCOUNTER — Other Ambulatory Visit: Payer: Self-pay

## 2018-08-21 MED ORDER — SACUBITRIL-VALSARTAN 24-26 MG PO TABS: 1.0000 | ORAL_TABLET | Freq: Every day | ORAL | 3 refills | Status: DC

## 2018-09-07 ENCOUNTER — Ambulatory Visit: Payer: Medicare Other | Admitting: Adult Health

## 2018-09-22 ENCOUNTER — Other Ambulatory Visit: Payer: Self-pay

## 2018-09-22 MED ORDER — CARVEDILOL 3.125 MG PO TABS: 3.1250 mg | ORAL_TABLET | Freq: Every day | ORAL | 1 refills | Status: DC

## 2018-10-19 ENCOUNTER — Ambulatory Visit: Payer: Medicare Other | Admitting: Adult Health

## 2018-11-13 ENCOUNTER — Other Ambulatory Visit: Payer: Self-pay | Admitting: Cardiology

## 2018-12-20 ENCOUNTER — Ambulatory Visit: Payer: Self-pay | Admitting: Cardiothoracic Surgery

## 2018-12-26 ENCOUNTER — Other Ambulatory Visit: Payer: Self-pay | Admitting: Cardiothoracic Surgery

## 2018-12-26 DIAGNOSIS — Z951 Presence of aortocoronary bypass graft: Secondary | ICD-10-CM

## 2018-12-27 ENCOUNTER — Ambulatory Visit: Payer: Medicare Other | Admitting: Cardiothoracic Surgery

## 2018-12-27 ENCOUNTER — Ambulatory Visit
Admission: RE | Admit: 2018-12-27 | Discharge: 2018-12-27 | Disposition: A | Discharge status: Home / Self Care | Payer: Medicare Other | Source: Ambulatory Visit | Attending: Cardiothoracic Surgery | Admitting: Cardiothoracic Surgery

## 2018-12-27 ENCOUNTER — Other Ambulatory Visit: Payer: Self-pay

## 2018-12-27 VITALS — BP 110/74 | HR 86 | Temp 97.7°F | Resp 20 | Ht 71.0 in | Wt 180.0 lb

## 2018-12-27 DIAGNOSIS — Z952 Presence of prosthetic heart valve: Secondary | ICD-10-CM

## 2018-12-27 DIAGNOSIS — Z951 Presence of aortocoronary bypass graft: Secondary | ICD-10-CM

## 2018-12-27 DIAGNOSIS — Z09 Encounter for follow-up examination after completed treatment for conditions other than malignant neoplasm: Secondary | ICD-10-CM | POA: Diagnosis not present

## 2018-12-27 NOTE — Progress Notes (Signed)
PCP is Hadley Penobbins, Robert A, MD Referring Provider is End, Cristal Deerhristopher, MD  Chief Complaint  Patient presents with  . Routine Post Op    5 month f/u with CXR    HPI: Patient returns for 1 year postop follow-up after CABG-mitral valve replacement and left atrial clipping for ischemic cardiomyopathy, chronic atrial fibrillation, preoperative stroke, and type 2 diabetes.  The patient is back at home and doing well.  He was unable to start cardiac rehab phase 2 because of the COVID restrictions.  He is walking around his house without symptoms of CHF.  Surgical incisions are well-healed.  Chest x-ray today shows clear lung fields no pleural effusion.  His EF still is 25% and he is being assessed for an AICD by Dr. Elberta Fortisamnitz.  He is compliant with his Coumadin and his heart failure meds under the direction of Dr. Dulce SellarMunley and is doing quite well.  He is unable to drive because of weakness in his left foot, residual of his old stroke   Past Medical History:  Diagnosis Date  . Atrial fibrillation with RVR (HCC) 05/01/2017  . Cardiomyopathy (HCC) 05/01/2017  . CHF (congestive heart failure) (HCC)   . 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 Coronary artery disease   . DM (diabetes mellitus) type 2, uncontrolled, with ketoacidosis (HCC) 05/01/2017  . Essential hypertension 11/26/2016  . History of kidney stones   . HLD (hyperlipidemia) 05/01/2017  . IVCD (intraventricular conduction defect) 04/24/2015  . Mitral valve regurgitation   . Mixed dyslipidemia 11/26/2016  . Postoperative urinary retention 02/23/2018  . 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 BRAIN SURGERY    . CARDIAC CATHETERIZATION    . CORONARY ARTERY BYPASS GRAFT N/A 01/23/2018   Procedure: CORONARY ARTERY BYPASS GRAFTING (CABG) x 4 WITH ENDOSCOPIC HARVESTING OF RIGHT GREATER SAPHENOUS VEIN: LIMA TO LAD, SVG TO RCA, SVG TO DIAG, SVG TO OM ;  Surgeon: [name],  Peter, MD;  [facility];  Service: Open Heart Surgery;  Laterality: N/A;  . IR PERCUTANEOUS ART THROMBECTOMY/INFUSION INTRACRANIAL INC DIAG ANGIO  04/29/2017  . IR RADIOLOGIST EVAL & MGMT  05/17/2017  . IR US GUIDE VASC ACCESS LEFT  04/29/2017  . IR US GUIDE VASC ACCESS RIGHT  04/29/2017  . LEFT ATRIAL APPENDAGE OCCLUSION Left 01/23/2018   Procedure: LEFT ATRIAL APPENDAGE OCCLUSION USING ATRICURE ATRICLIP PRO2 LAA EXCLUSION SYSTEM  SIZE 40, LOT # [id number], CAT # [id number], EXP. DATE 2020-04-28;  Surgeon: Donata ClayVan Trigt, Theron AristaPeter, MD;  [facility];  Service: Open Heart Surgery;  Laterality: Left;  . MITRAL VALVE REPLACEMENT N/A 01/23/2018   Procedure: MITRAL VALVE (MV) REPLACEMENT USING MAGNA MITRAL EASE PERICARDIAL BIOPROSTHESIS, MODEL [id number], SIZE 29 MM, SERIAL # [id number], EXPIRATION  DATE 2021-03-03.;  Surgeon: [name], Peter, MD;  [facility];  Service: Open Heart Surgery;  Laterality: N/A;  . MULTIPLE EXTRACTIONS WITH ALVEOLOPLASTY N/A 12/26/2017   Procedure: Extraction of tooth #'s 4,6-11, 18 -27, 30, and 31 with alveoloplasty;  Surgeon: Charlynne PanderKulinski, Ronald F, DDS;  Location: [facility] OR;  Service: Oral Surgery;  Laterality: N/A;  . RADIOLOGY WITH ANESTHESIA N/A 04/29/2017   Procedure: IR WITH ANESTHESIA;  Surgeon: Radiologist, Medication, MD;  Location: [facility] OR;  Service: Radiology;  Laterality: N/A;  . RIGHT/LEFT HEART CATH AND CORONARY ANGIOGRAPHY N/A 08/18/2017   Procedure: RIGHT/LEFT HEART CATH AND CORONARY ANGIOGRAPHY;  Surgeon: Yvonne KendallEnd, Christopher, MD;  Location: [facility] INVASIVE CV LAB;  Service: Cardiovascular;  Laterality: N/A;  . TEE WITHOUT CARDIOVERSION N/A 09/22/2017   Procedure: TRANSESOPHAGEAL ECHOCARDIOGRAM (TEE);  Surgeon: Lewayne Buntingrenshaw, Brian S, MD;  [facility];  Service: Cardiovascular;  Laterality: N/A;  . TEE WITHOUT CARDIOVERSION N/A 01/23/2018   Procedure: TRANSESOPHAGEAL ECHOCARDIOGRAM (TEE);  Surgeon: Donata ClayVan Trigt, Theron AristaPeter, MD;  [facility];  Service: Open Heart Surgery;  Laterality: N/A;   . TONSILLECTOMY      Family History  Problem Relation Age of Onset  . Hypertension Father   . Diabetes Father     Social History Social History   Tobacco Use  . Smoking status: Former Smoker    Types: Cigarettes    Quit date: 1994    Years since quitting: 26.5  . Smokeless tobacco: Never Used  Substance Use Topics  . Alcohol use: Yes    Alcohol/week: 3.0 standard drinks    Types: 1 Glasses of wine, 1 Cans of beer, 1 Standard drinks or equivalent per week    Comment: mix drink  . Drug use: No    Current Outpatient Medications  Medication Sig Dispense Refill  . bethanechol (URECHOLINE) 50 MG tablet Take 25 mg by mouth 2 (two) times daily.     . carvedilol (COREG) 3.125 MG tablet Take 1 tablet (3.125 mg total) by mouth daily after supper. 90 tablet 1  . pantoprazole (PROTONIX) 40 MG tablet Take 1 tablet (40 mg total) by mouth daily.    . pravastatin (PRAVACHOL) 20 MG tablet Take 20 mg by mouth daily.    . sacubitril-valsartan (ENTRESTO) 24-26 MG Take 1 tablet by mouth daily with breakfast. 90 tablet 3  . spironolactone (ALDACTONE) 25 MG tablet TAKE 1/2 TABLET(12.5 MG) BY MOUTH DAILY 15 tablet 3  . tamsulosin (FLOMAX) 0.4 MG CAPS capsule Take 0.4 mg by mouth at bedtime.   3  . warfarin (COUMADIN) 1 MG tablet Take 0.5 mg by mouth daily at 2 PM.    . warfarin (COUMADIN) 3 MG tablet Take 3 mg by mouth daily.     No current facility-administered medications for this visit.     Allergies  Allergen Reactions  . Fentanyl Shortness Of Breath  . Promethazine Hcl Anaphylaxis and Other (See Comments)    Cardiac arrest  . Foeniculum Vulgare Other (See Comments)    Fennel bulbs--nausea only  . Metformin And Related Diarrhea  . Penicillins Nausea Only    Has patient had a PCN reaction causing immediate rash, facial/tongue/throat swelling, SOB or lightheadedness with hypotension: no Has patient had a PCN reaction causing severe rash involving mucus membranes or skin necrosis:  no Has patient had a PCN reaction that required hospitalization: no Has patient had a PCN reaction occurring within the last 10 years: yes If all of the above answers are "NO", then may proceed with Cephalosporin use.   . Sulfa Antibiotics Other (See Comments)    G.I. Upset    Review of Systems   No fever No shortness of breath Gaining weight and strength No ankle edema No bleeding complications from the warfarin No abdominal pain No falls  BP 110/74   Pulse 86   Temp 97.7 F (36.5 C) (Skin)   Resp 20   Ht 5\' 11"  (1.803 m)   Wt 180 lb (81.6 kg)   SpO2 96% Comment: RA  BMI 25.10 kg/m  Physical Exam       Exam    General- alert and comfortable    Neck- no JVD, no cervical adenopathy palpable, no carotid  bruit   Lungs- clear without rales, wheezes   Cor- irregular rate and rhythm, no murmur , no gallop   Abdomen- soft, non-tender   Extremities - warm, non-tender, minimal edema   Neuro- oriented, appropriate, no focal weakness   Diagnostic Tests: Chest x-ray image performed today personally reviewed showing clear lung fields no pleural effusion, COPD.  Impression: Patient is recovered fairly well but still has residual low EF and left-sided weakness from old stroke.  He is being followed by Dr. Bettina Gavia and EP cardiology. He will return here as needed.  I discussed the importance of heart healthy lifestyle including optimally 20-minute walk 5 days a week and a heart healthy diet.   Plan: Return as needed  Len Childs, MD [facility] [phone]

## 2019-03-15 ENCOUNTER — Ambulatory Visit (INDEPENDENT_AMBULATORY_CARE_PROVIDER_SITE_OTHER): Payer: Medicare Other | Admitting: Sports Medicine

## 2019-03-15 ENCOUNTER — Encounter: Payer: Self-pay | Admitting: Sports Medicine

## 2019-03-15 ENCOUNTER — Other Ambulatory Visit: Payer: Self-pay

## 2019-03-15 DIAGNOSIS — D689 Coagulation defect, unspecified: Secondary | ICD-10-CM | POA: Diagnosis not present

## 2019-03-15 DIAGNOSIS — Z8673 Personal history of transient ischemic attack (TIA), and cerebral infarction without residual deficits: Secondary | ICD-10-CM

## 2019-03-15 DIAGNOSIS — M79675 Pain in left toe(s): Secondary | ICD-10-CM | POA: Diagnosis not present

## 2019-03-15 DIAGNOSIS — M79674 Pain in right toe(s): Secondary | ICD-10-CM

## 2019-03-15 DIAGNOSIS — B351 Tinea unguium: Secondary | ICD-10-CM

## 2019-03-15 NOTE — Progress Notes (Signed)
Subjective: Nathan D Jaegar Croft. is a 73 y.o. male patient seen today in office with complaint of mildly painful thickened and elongated toenails; unable to trim. Patient denies history of Diabetes pr Neuropathy has a history of vascular disease that is post stroke currently on Coumadin. Patient has no other pedal complaints at this time.   Review of Systems  All other systems reviewed and are negative.    Patient Active Problem List   Diagnosis Date Noted  . Chronic systolic (congestive) heart failure ([place]) 07/06/2018  . GERD without esophagitis 07/06/2018  . Ischemic cardiomyopathy 04/28/2018  . Hematuria 02/23/2018  . Bacterial UTI   . History of CVA with residual deficit   . Benign prostatic hyperplasia with urinary retention   . History of CVA (cerebrovascular accident) without residual deficits   . PAF (paroxysmal atrial fibrillation) ([place])   . Anemia of chronic disease   . Debility 02/03/2018  . 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 Pressure injury of skin 02/02/2018  . Malnutrition of moderate degree 02/01/2018  . Coronary artery disease involving native coronary artery of native heart with angina pectoris ([place])   . S/P CABG (coronary artery bypass graft)   . Atrial fibrillation ([place])   . Chronic combined systolic and diastolic CHF (congestive heart failure) ([place])   . Diabetes mellitus type 2 in nonobese (HCC)   . Essential hypertension   . History of CVA (cerebrovascular accident)   . Acute blood loss anemia   . Hyponatremia   . S/P left atrial appendage ligation 01/26/2018  . S/P CABG x 4 01/23/2018  . S/P MVR (mitral valve replacement) 01/23/2018  . Situational mixed anxiety and depressive disorder 11/05/2017  . Mitral valve insufficiency   . On amiodarone therapy 07/28/2017  . CAD in native artery 07/28/2017  . Chronic anticoagulation 05/18/2017  . LBBB (left bundle branch block) 05/18/2017  . Hospital discharge  follow-up 05/09/2017  . Paroxysmal atrial fibrillation ([place]) 05/01/2017  . Dilated cardiomyopathy ([place]) 05/01/2017  . DM (diabetes mellitus) type 2, uncontrolled, with ketoacidosis ([place]) 05/01/2017  . Hypotension 05/01/2017  . HLD (hyperlipidemia) 05/01/2017  . Cerebrovascular accident (CVA) due to embolism of right middle cerebral artery ([place]) 04/29/2017  . Hypertensive heart disease with heart failure ([place]) 11/26/2016  . Mixed dyslipidemia 11/26/2016  . Chronic systolic congestive heart failure ([place]) 04/24/2015  . IVCD (intraventricular conduction defect) 04/24/2015    Current Outpatient Medications on File Prior to Visit  Medication Sig Dispense Refill  . bethanechol (URECHOLINE) 50 MG tablet Take 25 mg by mouth 2 (two) times daily.     . carvedilol (COREG) 3.125 MG tablet Take 1 tablet (3.125 mg total) by mouth daily after supper. 90 tablet 1  . Multiple Vitamin (MULTI-VITAMIN) tablet Take by mouth.    . pantoprazole (PROTONIX) 40 MG tablet Take 1 tablet (40 mg total) by mouth daily.    . pravastatin (PRAVACHOL) 20 MG tablet Take 20 mg by mouth daily.    . sacubitril-valsartan (ENTRESTO) 24-26 MG Take 1 tablet by mouth daily with breakfast. 90 tablet 3  . spironolactone (ALDACTONE) 25 MG tablet TAKE 1/2 TABLET(12.5 MG) BY MOUTH DAILY 15 tablet 3  . tamsulosin (FLOMAX) 0.4 MG CAPS capsule Take 0.4 mg by mouth at bedtime.   3  . warfarin (COUMADIN) 1 MG tablet Take 0.5 mg by mouth daily at 2 PM.    . warfarin (COUMADIN) 3 MG tablet Take 3 mg by mouth daily.  No current facility-administered medications on file prior to visit.     Allergies  Allergen Reactions  . Fentanyl Shortness Of Breath  . Promethazine Hcl Anaphylaxis and Other (See Comments)    Cardiac arrest  . Foeniculum Vulgare Other (See Comments)    Fennel bulbs--nausea only  . Metformin And Related Diarrhea  . Penicillins Nausea Only    Has patient had a PCN reaction causing immediate rash, facial/tongue/throat  swelling, SOB or lightheadedness with hypotension: no Has patient had a PCN reaction causing severe rash involving mucus membranes or skin necrosis: no Has patient had a PCN reaction that required hospitalization: no Has patient had a PCN reaction occurring within the last 10 years: yes If all of the above answers are "NO", then may proceed with Cephalosporin use.   . Sulfa Antibiotics Other (See Comments)    G.I. Upset    Objective: Physical Exam  General: Well developed, nourished, no acute distress, awake, alert and oriented x 3  Vascular: Dorsalis pedis artery 2/4 bilateral, Posterior tibial artery 1/4 bilateral, skin temperature warm to warm proximal to distal bilateral lower extremities, no varicosities, pedal hair present bilateral.  Neurological: Gross sensation present via light touch bilateral.   Dermatological: Skin is warm, dry, and supple bilateral, Nails 1-10 are tender, long, thick, and discolored with mild subungal debris, no webspace macerations present bilateral, no open lesions present bilateral, no callus/corns/hyperkeratotic tissue present bilateral. No signs of infection bilateral.  Musculoskeletal: No symptomatic boney deformities noted bilateral. Muscular strength within normal limits without painon range of motion. No pain with calf compression bilateral.  Assessment and Plan:  Problem List Items Addressed This Visit      Other   History of CVA (cerebrovascular accident) without residual deficits    Other Visit Diagnoses    Pain due to onychomycosis of toenails of both feet    -  Primary   Coagulation defect (HCC)          -Examined patient.  -Discussed treatment options for painful mycotic nails. -Mechanically debrided and reduced mycotic nails with sterile nail nipper and dremel nail file without incident. -Patient to return in 3 months for follow up evaluation or sooner if symptoms worsen.  Asencion Islamitorya Tiare Rohlman, DPM

## 2019-03-17 ENCOUNTER — Other Ambulatory Visit: Payer: Self-pay | Admitting: Cardiology

## 2019-03-23 ENCOUNTER — Telehealth: Payer: Self-pay | Admitting: Cardiology

## 2019-03-23 MED ORDER — CARVEDILOL 3.125 MG PO TABS: 3.1250 mg | ORAL_TABLET | Freq: Every day | ORAL | 0 refills | Status: DC

## 2019-03-23 NOTE — Telephone Encounter (Signed)
Refill for carvedilol sent to [facility] in [place] as requested with no refills. Patient will receive further refills during his scheduled follow up appointment on 05/30/2019.

## 2019-03-23 NOTE — Addendum Note (Signed)
Addended by: Austin Miles on: 03/23/2019 08:57 AM   Modules accepted: Orders

## 2019-03-23 NOTE — Telephone Encounter (Signed)
°*  STAT* If patient is at the pharmacy, call can be transferred to refill team.   1. Which medications need to be refilled? (please list name of each medication and dose if known) Carvedilol   2. Which pharmacy/location (including street and city if local pharmacy) is medication to be sent to? [facility] on [address]   3. Do they need a 30 day or 90 day supply? [place]

## 2019-05-30 ENCOUNTER — Encounter: Payer: Self-pay | Admitting: Cardiology

## 2019-05-30 ENCOUNTER — Other Ambulatory Visit: Payer: Self-pay

## 2019-05-30 ENCOUNTER — Ambulatory Visit (INDEPENDENT_AMBULATORY_CARE_PROVIDER_SITE_OTHER): Payer: Medicare Other | Admitting: Cardiology

## 2019-05-30 VITALS — BP 102/74 | Ht 71.0 in | Wt 192.0 lb

## 2019-05-30 DIAGNOSIS — I480 Paroxysmal atrial fibrillation: Secondary | ICD-10-CM

## 2019-05-30 DIAGNOSIS — Z952 Presence of prosthetic heart valve: Secondary | ICD-10-CM

## 2019-05-30 DIAGNOSIS — E782 Mixed hyperlipidemia: Secondary | ICD-10-CM

## 2019-05-30 DIAGNOSIS — I2510 Atherosclerotic heart disease of native coronary artery without angina pectoris: Secondary | ICD-10-CM

## 2019-05-30 DIAGNOSIS — I110 Hypertensive heart disease with heart failure: Secondary | ICD-10-CM

## 2019-05-30 DIAGNOSIS — I5022 Chronic systolic (congestive) heart failure: Secondary | ICD-10-CM | POA: Diagnosis not present

## 2019-05-30 NOTE — Progress Notes (Signed)
Cardiology Office Note:    Date:  05/30/2019   ID:  Nathan PhilipsRandleman D Matt Jr., DOB 11-08-1945, MRN [id number]  PCP:  Hadley Penobbins, Robert A, MD  Cardiologist:  Norman HerrlichBrian Andrian Urbach, MD    Referring MD: Hadley Penobbins, Robert A, MD    ASSESSMENT:    1. Chronic systolic congestive heart failure (HCC)   2. Hypertensive heart disease with heart failure (HCC)   3. CAD in native artery   4. S/P MVR (mitral valve replacement)   5. Hypertensive heart disease with chronic systolic congestive heart failure (HCC)   6. PAF (paroxysmal atrial fibrillation) (HCC)   7. Mixed hyperlipidemia    PLAN:    In order of problems listed above:  1. Marked improvement functional status New York Heart Association class I he tolerates minimum dose of beta-blocker and Entresto obviously has markedly improved functionally and like to see his echocardiogram regarding ejection fraction left ventricular volume and also thrombus.  Continue current treatment if ejection fraction is improved greater than 25 to 30% try to uptitrate.  He decided not to accept ICD therapy if his EF remains less than 35 to 40% I will reintroduce the subject of the patient and his wife. 2. Improved BP is at target systolic of greater than 100 and tolerates his guideline directed therapy 3. Stable CAD continue medical therapy at the moment dose of beta-blocker and a statin 4. Check echocardiogram clinically stable function 5. Maintaining sinus rhythm at this time does not require an antiarrhythmic drug note he had surgical maze procedure 6. Continue statin check liver function lipid profile   Next appointment: 3 months   Medication Adjustments/Labs and Tests Ordered: Current medicines are reviewed at length with the patient today.  Concerns regarding medicines are outlined above.  No orders of the defined types were placed in this encounter.  No orders of the defined types were placed in this encounter.   Chief Complaint  Patient presents with    Coronary Artery Disease    and MVR   Cardiomyopathy   Congestive Heart Failure   Atrial Fibrillation   Anticoagulation   Hypertension   Hyperlipidemia    History of Present Illness:    Nathan D [name] is a 73 y.o. male with a hx of CABG-bioprosthetic mitral valve replacement-left atrial closure, heart failure left bundle branch block hypertensive heart disease and paroxysmal atrial fibrillation with post open heart surgery stroke seen 04/28/2018.  Prior to that visit ejection fraction was 20% echocardi[facility] at 13[facility]  He was last seen 06/14/2018. Compliance with diet, lifestyle and medications: Yes  Echo 07/26/2018: - Left ventricle: The cavity size was severely dilated. Wall   thickness was increased in a pattern of mild LVH. Systolic   function was normal. The estimated ejection fraction was in the   range of 15% to 20%. - Mitral valve: Calcified annulus. A prosthesis was present and   functioning normally. The prosthesis had a normal range of   motion. The sewing ring appeared normal, had no rocking motion,   and showed no evidence of dehiscence. Valve area by continuity   equation (using LVOT flow): 1.31 cm^2. - Left atrium: The atrium was mildly dilated. Impressions: - 1. Severely dilated LV. EF visually estimated at 15-20%. Impaired   relaxation. Mid LVH.   2. There is a mention of bioprosthetic MV and function appears satisfactory.   3.Echodensity in LV raised possibility of organized thrombus.  He really is done remarkably well his A1c  is now normal his weight is stable he has no edema he has had no shortness of breath orthopnea chest pain palpitation or syncope.  His INR is in range managed by his PCP.  Inquires about stopping and I think he needs to have an echocardiogram performed to look at LV function but also to look for LV thrombus regardless he should remain long-term anticoagulated with stroke.  He is taken minimum doses of  beta-blocker and Entresto because of previous hypotension refractory heart failure obviously has functionally improved and of his ejection fraction has had a marked improvement I think he would benefit from attempts of up titration of his Entresto and beta-blocker.  He is overdue we will check labs including CMP lipids CBC with anticoagulation and proBNP level.  I will see back in the office in 3 months.  Past Medical History:  Diagnosis Date   Atrial fibrillation with RVR (HCC) 05/01/2017   Cardiomyopathy (HCC) 05/01/2017   CHF (congestive heart failure) (HCC)    Chronic combined systolic and diastolic heart failure (HCC) 04/24/2015   Coronary artery disease    DM (diabetes mellitus) type 2, uncontrolled, with ketoacidosis (HCC) 05/01/2017   Essential hypertension 11/26/2016   History of kidney stones    HLD (hyperlipidemia) 05/01/2017   IVCD (intraventricular conduction defect) 04/24/2015   Mitral valve regurgitation    Mixed dyslipidemia 11/26/2016   Postoperative urinary retention 02/23/2018   Stroke (cerebrum) (HCC) 04/29/2017    Past Surgical History:  Procedure Laterality Date   BRAIN SURGERY     CARDIAC CATHETERIZATION     CORONARY ARTERY BYPASS GRAFT N/A 01/23/2018   Procedure: CORONARY ARTERY BYPASS GRAFTING (CABG) x 4 WITH ENDOSCOPIC HARVESTING OF RIGHT GREATER SAPHENOUS VEIN: LIMA TO LAD, SVG TO RCA, SVG TO DIAG, SVG TO OM ;  Surgeon: Kerin Perna, MD;  Location: [facility] OR;  Service: Open Heart Surgery;  Laterality: N/A;   IR PERCUTANEOUS ART THROMBECTOMY/INFUSION INTRACRANIAL INC DIAG ANGIO  04/29/2017   IR RADIOLOGIST EVAL & MGMT  05/17/2017   IR US GUIDE VASC ACCESS LEFT  04/29/2017   IR US GUIDE VASC ACCESS RIGHT  04/29/2017   LEFT ATRIAL APPENDAGE OCCLUSION Left 01/23/2018   Procedure: LEFT ATRIAL APPENDAGE OCCLUSION USING ATRICURE ATRICLIP PRO2 LAA EXCLUSION SYSTEM  SIZE 40, LOT # [id number], CAT # [id number], EXP. DATE 2020-04-28;  Surgeon: Donata Clay, Theron Arista, MD;   Location: [facility] OR;  Service: Open Heart Surgery;  Laterality: Left;   MITRAL VALVE REPLACEMENT N/A 01/23/2018   Procedure: MITRAL VALVE (MV) REPLACEMENT USING MAGNA MITRAL EASE PERICARDIAL BIOPROSTHESIS, MODEL [id number], SIZE 29 MM, SERIAL # [id number], EXPIRATION  DATE 2021-03-03.;  Surgeon: Kerin Perna, MD;  Location: [facility] OR;  Service: Open Heart Surgery;  Laterality: N/A;   MULTIPLE EXTRACTIONS WITH ALVEOLOPLASTY N/A 12/26/2017   Procedure: Extraction of tooth #'s 4,6-11, 18 -27, 30, and 31 with alveoloplasty;  Surgeon: [name], DDS;  Location: [facility] OR;  Service: Oral Surgery;  Laterality: N/A;   RADIOLOGY WITH ANESTHESIA N/A 04/29/2017   Procedure: IR WITH ANESTHESIA;  Surgeon: Radiologist, Medication, MD;  Location: [facility] OR;  Service: Radiology;  Laterality: N/A;   RIGHT/LEFT HEART CATH AND CORONARY ANGIOGRAPHY N/A 08/18/2017   Procedure: RIGHT/LEFT HEART CATH AND CORONARY ANGIOGRAPHY;  Surgeon: [name], MD;  Location: [facility] INVASIVE CV LAB;  Service: Cardiovascular;  Laterality: N/A;   TEE WITHOUT CARDIOVERSION N/A 09/22/2017   Procedure: TRANSESOPHAGEAL ECHOCARDIOGRAM (TEE);  Surgeon: [name], MD;  Location: [facility] ENDOSCOPY;  Service: Cardiovascular;  Laterality: N/A;   TEE WITHOUT CARDIOVERSION N/A 01/23/2018   Procedure: TRANSESOPHAGEAL ECHOCARDIOGRAM (TEE);  Surgeon: Prescott Gum, Collier Salina, MD;  Location: [place];  Service: Open Heart Surgery;  Laterality: N/A;   TONSILLECTOMY      Current Medications: Current Meds  Medication Sig   bethanechol (URECHOLINE) 50 MG tablet Take 50 mg by mouth 2 (two) times daily.    carvedilol (COREG) 3.125 MG tablet Take 1 tablet (3.125 mg total) by mouth daily after supper.   pantoprazole (PROTONIX) 40 MG tablet Take 1 tablet (40 mg total) by mouth daily.   pravastatin (PRAVACHOL) 20 MG tablet Take 20 mg by mouth daily.   sacubitril-valsartan (ENTRESTO) 24-26 MG Take 1 tablet by mouth daily with breakfast.   spironolactone (ALDACTONE)  25 MG tablet Take 0.5 tablets (12.5 mg total) by mouth daily. *PLEASE SCHEDULE APPOINTMENT FOR REFILLS*   tamsulosin (FLOMAX) 0.4 MG CAPS capsule Take 0.4 mg by mouth at bedtime.    warfarin (COUMADIN) 1 MG tablet Take 0.5 mg by mouth daily at 2 PM.   warfarin (COUMADIN) 3 MG tablet Take 3 mg by mouth daily.     Allergies:   Fentanyl, Promethazine hcl, Foeniculum vulgare, Metformin and related, Penicillins, and Sulfa antibiotics   Social History   Socioeconomic History   Marital status: Married    Spouse name: Not on file   Number of children: Not on file   Years of education: Not on file   Highest education level: Not on file  Occupational History   Not on file  Social Needs   Financial resource strain: Not on file   Food insecurity    Worry: Not on file    Inability: Not on file   Transportation needs    Medical: Not on file    Non-medical: Not on file  Tobacco Use   Smoking status: Former Smoker    Types: Cigarettes    Quit date: 1994    Years since quitting: 26.9   Smokeless tobacco: Never Used  Substance and Sexual Activity   Alcohol use: Not Currently    Alcohol/week: 3.0 standard drinks    Types: 1 Glasses of wine, 1 Cans of beer, 1 Standard drinks or equivalent per week    Comment: mix drink   Drug use: No   Sexual activity: Not on file  Lifestyle   Physical activity    Days per week: Not on file    Minutes per session: Not on file   Stress: Not on file  Relationships   Social connections    Talks on phone: Not on file    Gets together: Not on file    Attends religious service: Not on file    Active member of club or organization: Not on file    Attends meetings of clubs or organizations: Not on file    Relationship status: Not on file  Other Topics Concern   Not on file  Social History Narrative   Not on file     Family History: The patient's family history includes Diabetes in his father; Hypertension in his father. ROS:     Please see the history of present illness.    All other systems reviewed and are negative.  EKGs/Labs/Other Studies Reviewed:    The following studies were reviewed today:  EKG:  EKG ordered today and personally reviewed.  The ekg ordered today demonstrates sinus rhythm nonspecific conduction delay atypical left bundle branch block with repolarization  Recent Labs: His last for INR's  have run between 2.1 and 2.6 last on 05/10/2019 was  2.4 06/14/2018: ALT 25; BUN 24; Creatinine, Ser 1.00; NT-Pro BNP 817; Potassium 4.9; Sodium 133  Recent Lipid Panel    Component Value Date/Time   CHOL 156 06/14/2018 1645   TRIG 131 06/14/2018 1645   HDL 36 (L) 06/14/2018 1645   CHOLHDL 4.3 06/14/2018 1645   CHOLHDL 3.4 04/29/2017 0451   VLDL 21 04/29/2017 0451   LDLCALC 94 06/14/2018 1645    Physical Exam:    VS:  BP 102/74 (BP Location: Left Arm, Patient Position: Sitting, Cuff Size: Normal)    Ht 5\' 11"  (1.803 m)    Wt 192 lb (87.1 kg)    SpO2 99%    BMI 26.78 kg/m     Wt Readings from Last 3 Encounters:  05/30/19 192 lb (87.1 kg)  12/27/18 180 lb (81.6 kg)  08/07/18 175 lb (79.4 kg)     GEN: He certainly does not look nearly as sick as he used to be he is not chronically ill or debilitated well nourished, well developed in no acute distress HEENT: Normal NECK: No JVD; No carotid bruits LYMPHATICS: No lymphadenopathy CARDIAC: Soft S1 no murmur no gallop RRR, no murmurs, rubs, gallops RESPIRATORY:  Clear to auscultation without rales, wheezing or rhonchi  ABDOMEN: Soft, non-tender, non-distended MUSCULOSKELETAL:  No edema; No deformity  SKIN: Warm and dry NEUROLOGIC:  Alert and oriented x 3 PSYCHIATRIC:  Normal affect    Signed, 10/06/18, MD  05/30/2019 4:57 PM    [facility]

## 2019-05-30 NOTE — Patient Instructions (Signed)
Medication Instructions:  Your physician recommends that you continue on your current medications as directed. Please refer to the Current Medication list given to you today.  *If you need a refill on your cardiac medications before your next appointment, please call your pharmacy*  Lab Work: Your physician recommends that you return for lab work today: CMP, CBC, lipid panel, ProBNP.   If you have labs (blood work) drawn today and your tests are completely normal, you will receive your results only by: [name] MyChart Message (if you have MyChart) OR . A paper copy in the mail If you have any lab test that is abnormal or we need to change your treatment, we will call you to review the results.  Testing/Procedures: You had an EKG today.   Your physician has requested that you have an echocardiogram. Echocardiography is a painless test that uses sound waves to create images of your heart. It provides your doctor with information about the size and shape of your heart and how well your heart's chambers and valves are working. This procedure takes approximately one hour. There are no restrictions for this procedure.  Follow-Up: At [facility], you and your health needs are our priority.  As part of our continuing mission to provide you with exceptional heart care, we have created designated Provider Care Teams.  These Care Teams include your primary Cardiologist (physician) and Advanced Practice Providers (APPs -  Physician Assistants and Nurse Practitioners) who all work together to provide you with the care you need, when you need it.  Your next appointment:   3 month(s)  The format for your next appointment:   In Person  Provider:   Norman Herrlich, MD    Echocardiogram An echocardiogram is a procedure that uses painless sound waves (ultrasound) to produce an image of the heart. Images from an echocardiogram can provide important information about:  Signs of coronary artery disease (CAD).   Aneurysm detection. An aneurysm is a weak or damaged part of an artery wall that bulges out from the normal force of blood pumping through the body.  Heart size and shape. Changes in the size or shape of the heart can be associated with certain conditions, including heart failure, aneurysm, and CAD.  Heart muscle function.  Heart valve function.  Signs of a past heart attack.  Fluid buildup around the heart.  Thickening of the heart muscle.  A tumor or infectious growth around the heart valves. Tell a health care provider about:  Any allergies you have.  All medicines you are taking, including vitamins, herbs, eye drops, creams, and over-the-counter medicines.  Any blood disorders you have.  Any surgeries you have had.  Any medical conditions you have.  Whether you are pregnant or may be pregnant. What are the risks? Generally, this is a safe procedure. However, problems may occur, including:  Allergic reaction to dye (contrast) that may be used during the procedure. What happens before the procedure? No specific preparation is needed. You may eat and drink normally. What happens during the procedure?   An IV tube may be inserted into one of your veins.  You may receive contrast through this tube. A contrast is an injection that improves the quality of the pictures from your heart.  A gel will be applied to your chest.  A wand-like tool (transducer) will be moved over your chest. The gel will help to transmit the sound waves from the transducer.  The sound waves will harmlessly bounce off of  your heart to allow the heart images to be captured in real-time motion. The images will be recorded on a computer. The procedure may vary among health care providers and hospitals. What happens after the procedure?  You may return to your normal, everyday life, including diet, activities, and medicines, unless your health care provider tells you not to do that. Summary  An  echocardiogram is a procedure that uses painless sound waves (ultrasound) to produce an image of the heart.  Images from an echocardiogram can provide important information about the size and shape of your heart, heart muscle function, heart valve function, and fluid buildup around your heart.  You do not need to do anything to prepare before this procedure. You may eat and drink normally.  After the echocardiogram is completed, you may return to your normal, everyday life, unless your health care provider tells you not to do that. This information is not intended to replace advice given to you by your health care provider. Make sure you discuss any questions you have with your health care provider. Document Released: 06/11/2000 Document Revised: 10/05/2018 Document Reviewed: 07/17/2016 Elsevier Patient Education  2020 [employer].

## 2019-05-31 LAB — LIPID PANEL
Chol/HDL Ratio: 4 ratio (ref 0.0–5.0)
Cholesterol, Total: 145 mg/dL (ref 100–199)
HDL: 36 mg/dL — ABNORMAL LOW (ref >39)
LDL Chol Calc (NIH): 84 mg/dL (ref 0–99)
Triglycerides: 138 mg/dL (ref 0–149)
VLDL Cholesterol Cal: 25 mg/dL (ref 5–40)

## 2019-05-31 LAB — COMPREHENSIVE METABOLIC PANEL
ALT: 15 IU/L (ref 0–44)
AST: 20 IU/L (ref 0–40)
Albumin/Globulin Ratio: 1.8 (ref 1.2–2.2)
Albumin: 4.5 g/dL (ref 3.7–4.7)
Alkaline Phosphatase: 94 IU/L (ref 39–117)
BUN/Creatinine Ratio: 19 (ref 10–24)
BUN: 21 mg/dL (ref 8–27)
Bilirubin Total: 0.5 mg/dL (ref 0.0–1.2)
CO2: 24 mmol/L (ref 20–29)
Calcium: 9.6 mg/dL (ref 8.6–10.2)
Chloride: 104 mmol/L (ref 96–106)
Creatinine, Ser: 1.12 mg/dL (ref 0.76–1.27)
GFR calc Af Amer: 76 mL/min/{1.73_m2} (ref >59)
GFR calc non Af Amer: 66 mL/min/{1.73_m2} (ref >59)
Globulin, Total: 2.5 g/dL (ref 1.5–4.5)
Glucose: 132 mg/dL — ABNORMAL HIGH (ref 65–99)
Potassium: 4.8 mmol/L (ref 3.5–5.2)
Sodium: 140 mmol/L (ref 134–144)
Total Protein: 7 g/dL (ref 6.0–8.5)

## 2019-05-31 LAB — CBC
Hematocrit: 40.3 % (ref 37.5–51.0)
Hemoglobin: 13.6 g/dL (ref 13.0–17.7)
MCH: 31.9 pg (ref 26.6–33.0)
MCHC: 33.7 g/dL (ref 31.5–35.7)
MCV: 95 fL (ref 79–97)
Platelets: 176 10*3/uL (ref 150–450)
RBC: 4.26 x10E6/uL (ref 4.14–5.80)
RDW: 12 % (ref 11.6–15.4)
WBC: 7.3 10*3/uL (ref 3.4–10.8)

## 2019-05-31 LAB — PRO B NATRIURETIC PEPTIDE: NT-Pro BNP: 1031 pg/mL — ABNORMAL HIGH (ref 0–376)

## 2019-05-31 NOTE — Addendum Note (Signed)
Addended by: Stevan Born on: 05/31/2019 04:41 PM   Modules accepted: Orders

## 2019-06-06 ENCOUNTER — Encounter: Payer: Self-pay | Admitting: Sports Medicine

## 2019-06-06 ENCOUNTER — Other Ambulatory Visit: Payer: Self-pay

## 2019-06-06 ENCOUNTER — Ambulatory Visit: Payer: Medicare Other | Admitting: Sports Medicine

## 2019-06-06 DIAGNOSIS — D689 Coagulation defect, unspecified: Secondary | ICD-10-CM

## 2019-06-06 DIAGNOSIS — M79675 Pain in left toe(s): Secondary | ICD-10-CM

## 2019-06-06 DIAGNOSIS — B351 Tinea unguium: Secondary | ICD-10-CM | POA: Diagnosis not present

## 2019-06-06 DIAGNOSIS — Z8673 Personal history of transient ischemic attack (TIA), and cerebral infarction without residual deficits: Secondary | ICD-10-CM

## 2019-06-06 DIAGNOSIS — M79674 Pain in right toe(s): Secondary | ICD-10-CM

## 2019-06-06 NOTE — Progress Notes (Signed)
Subjective: Nathan [name]. is a 73 y.o. male patient seen today in office with complaint of mildly painful thickened and elongated toenails; unable to trim. Patient reports that he is doing well.good checkup from his cardiologist as well as his PCP and will see his doctor again very soon and they may take him off of his warfarin.  Denies any other complaints at this time.  Patient Active Problem List   Diagnosis Date Noted  . Chronic systolic (congestive) heart failure (HCC) 07/06/2018  . GERD without esophagitis 07/06/2018  . Ischemic cardiomyopathy 04/28/2018  . Hematuria 02/23/2018  . Bacterial UTI   . History of CVA with residual deficit   . Benign prostatic hyperplasia with urinary retention   . History of CVA (cerebrovascular accident) without residual deficits   . PAF (paroxysmal atrial fibrillation) (HCC)   . Anemia of chronic disease   . Debility 02/03/2018  . 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 Pressure injury of skin 02/02/2018  . Malnutrition of moderate degree 02/01/2018  . Coronary artery disease involving native coronary artery of native heart with angina pectoris (HCC)   . S/P CABG (coronary artery bypass graft)   . Atrial fibrillation (HCC)   . Chronic combined systolic and diastolic CHF (congestive heart failure) (HCC)   . Diabetes mellitus type 2 in nonobese (HCC)   . Essential hypertension   . History of CVA (cerebrovascular accident)   . Acute blood loss anemia   . Hyponatremia   . S/P left atrial appendage ligation 01/26/2018  . S/P CABG x 4 01/23/2018  . S/P MVR (mitral valve replacement) 01/23/2018  . Situational mixed anxiety and depressive disorder 11/05/2017  . Mitral valve insufficiency   . On amiodarone therapy 07/28/2017  . CAD in native artery 07/28/2017  . Chronic anticoagulation 05/18/2017  . LBBB (left bundle branch block) 05/18/2017  . Hospital discharge follow-up 05/09/2017  . Paroxysmal  atrial fibrillation (HCC) 05/01/2017  . Dilated cardiomyopathy (HCC) 05/01/2017  . DM (diabetes mellitus) type 2, uncontrolled, with ketoacidosis (HCC) 05/01/2017  . Hypotension 05/01/2017  . HLD (hyperlipidemia) 05/01/2017  . Cerebrovascular accident (CVA) due to embolism of right middle cerebral artery (HCC) 04/29/2017  . Hypertensive heart disease with heart failure (HCC) 11/26/2016  . Mixed dyslipidemia 11/26/2016  . Chronic systolic congestive heart failure (HCC) 04/24/2015  . IVCD (intraventricular conduction defect) 04/24/2015    Current Outpatient Medications on File Prior to Visit  Medication Sig Dispense Refill  . bethanechol (URECHOLINE) 50 MG tablet Take 50 mg by mouth 2 (two) times daily.     . carvedilol (COREG) 3.125 MG tablet Take 1 tablet (3.125 mg total) by mouth daily after supper. 90 tablet 0  . pantoprazole (PROTONIX) 40 MG tablet Take 1 tablet (40 mg total) by mouth daily.    . pravastatin (PRAVACHOL) 20 MG tablet Take 20 mg by mouth daily.    . sacubitril-valsartan (ENTRESTO) 24-26 MG Take 1 tablet by mouth daily with breakfast. 90 tablet 3  . spironolactone (ALDACTONE) 25 MG tablet Take 0.5 tablets (12.5 mg total) by mouth daily. *PLEASE SCHEDULE APPOINTMENT FOR REFILLS* 15 tablet 0  . tamsulosin (FLOMAX) 0.4 MG CAPS capsule Take 0.4 mg by mouth at bedtime.   3  . warfarin (COUMADIN) 1 MG tablet Take 0.5 mg by mouth daily at 2 PM.    . warfarin (COUMADIN) 3 MG tablet Take 3 mg by mouth daily.     No current facility-administered medications  on file prior to visit.     Allergies  Allergen Reactions  . Fentanyl Shortness Of Breath  . Promethazine Hcl Anaphylaxis and Other (See Comments)    Cardiac arrest  . Foeniculum Vulgare Other (See Comments)    Fennel bulbs--nausea only  . Metformin And Related Diarrhea  . Penicillins Nausea Only    Has patient had a PCN reaction causing immediate rash, facial/tongue/throat swelling, SOB or lightheadedness with  hypotension: no Has patient had a PCN reaction causing severe rash involving mucus membranes or skin necrosis: no Has patient had a PCN reaction that required hospitalization: no Has patient had a PCN reaction occurring within the last 10 years: yes If all of the above answers are "NO", then may proceed with Cephalosporin use.   . Sulfa Antibiotics Other (See Comments)    G.I. Upset    Objective: Physical Exam  General: Well developed, nourished, no acute distress, awake, alert and oriented x 3  Vascular: Dorsalis pedis artery 2/4 bilateral, Posterior tibial artery 1/4 bilateral, skin temperature warm to warm proximal to distal bilateral lower extremities, no varicosities, pedal hair present bilateral.  Neurological: Gross sensation present via light touch bilateral.   Dermatological: Skin is warm, dry, and supple bilateral, Nails 1-10 are tender, long, thick, and discolored with mild subungal debris, no webspace macerations present bilateral, no open lesions present bilateral, no callus/corns/hyperkeratotic tissue present bilateral. No signs of infection bilateral.  Musculoskeletal: No symptomatic boney deformities noted bilateral. Muscular strength within normal limits without painon range of motion. No pain with calf compression bilateral.  Assessment and Plan:  Problem List Items Addressed This Visit      Other   History of CVA (cerebrovascular accident) without residual deficits    Other Visit Diagnoses    Pain due to onychomycosis of toenails of both feet    -  Primary   Coagulation defect ([place])          -Examined patient.  -Discussed treatment options for painful mycotic nails. -Mechanically debrided and reduced mycotic nails with sterile nail nipper and dremel nail file without incident. -Patient to return in 3 months for follow up evaluation or sooner if symptoms worsen.  Landis Martins, DPM

## 2019-06-18 ENCOUNTER — Other Ambulatory Visit: Payer: Self-pay | Admitting: Cardiology

## 2019-06-21 DIAGNOSIS — E559 Vitamin D deficiency, unspecified: Secondary | ICD-10-CM

## 2019-06-21 DIAGNOSIS — Z1211 Encounter for screening for malignant neoplasm of colon: Secondary | ICD-10-CM

## 2019-06-21 HISTORY — DX: Encounter for screening for malignant neoplasm of colon: Z12.11

## 2019-06-21 HISTORY — DX: Vitamin D deficiency, unspecified: E55.9

## 2019-06-24 ENCOUNTER — Other Ambulatory Visit: Payer: Self-pay | Admitting: Cardiology

## 2019-07-14 IMAGING — DX DG CHEST 1V PORT
1 series · 1 of 1 positions shown · non-contrast
Comparison: 01/23/2018

CLINICAL DATA: Follow-up chest tube

EXAM:
PORTABLE CHEST 1 VIEW

[chest ap]
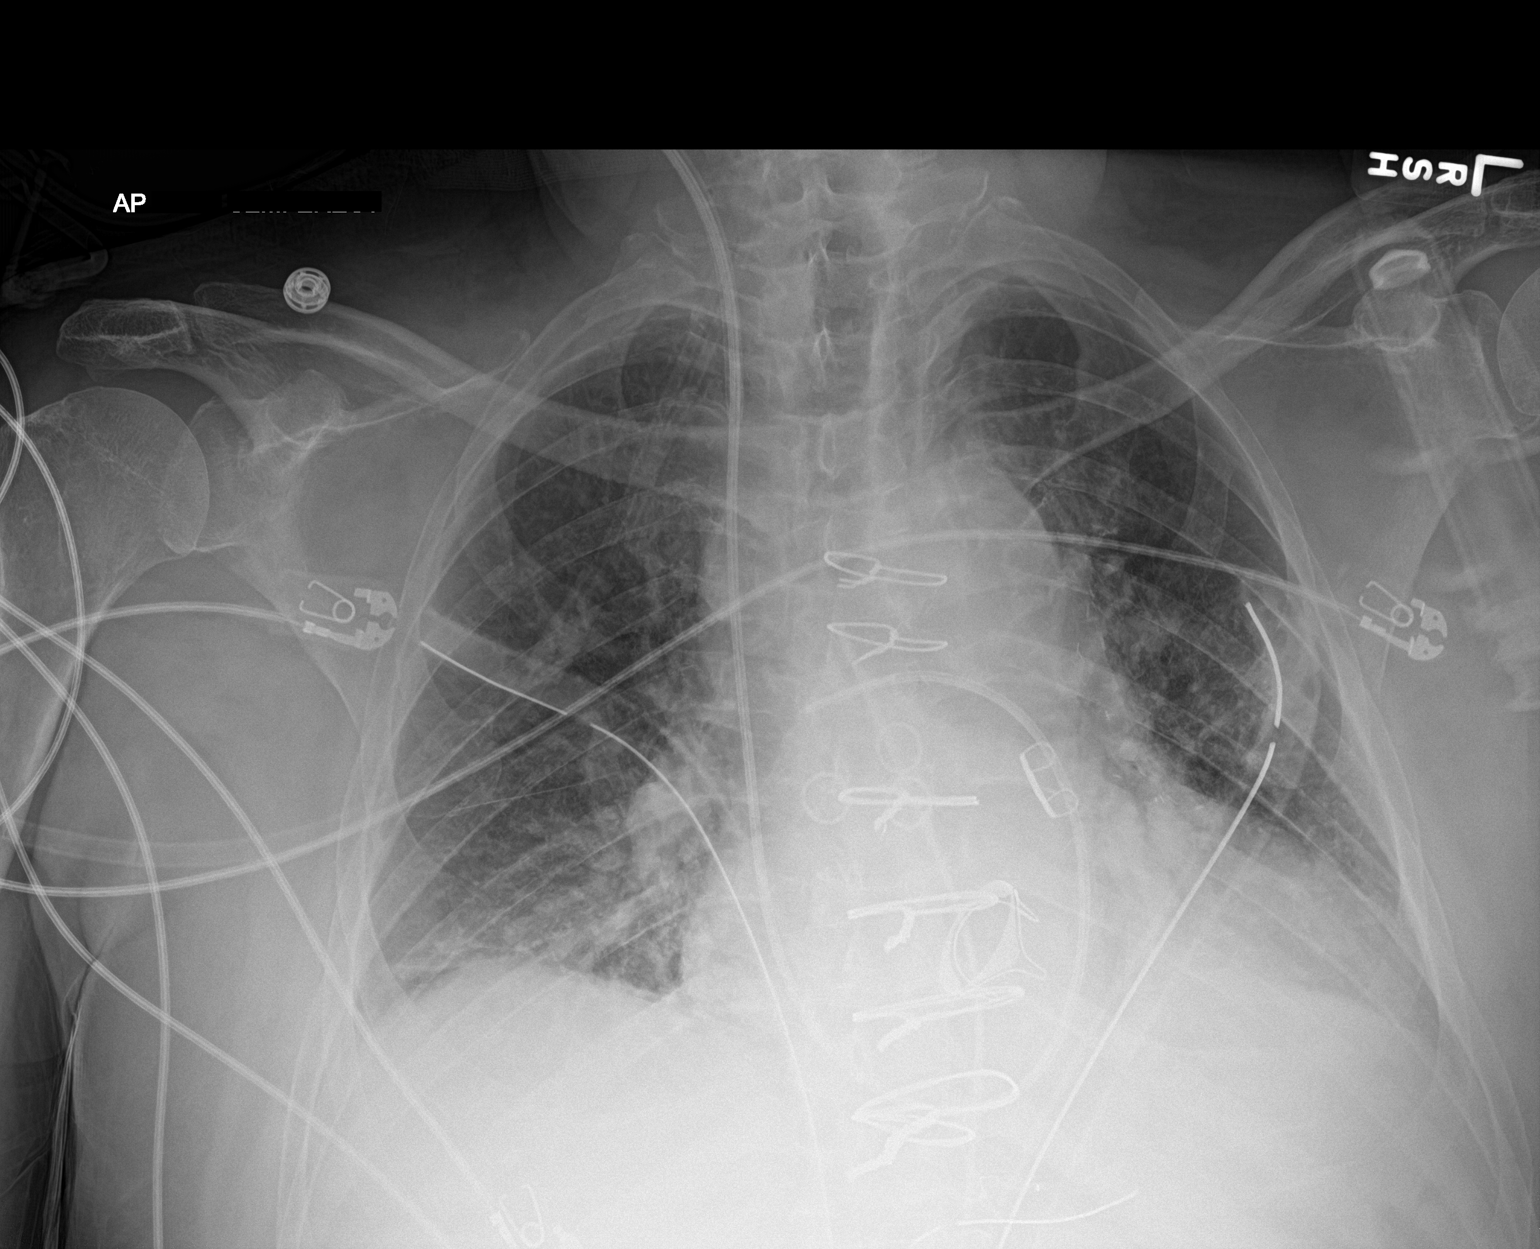

[1 of 1 positions shown; findings below may reference images not displayed]

FINDINGS: Cardiac shadow is stable. Postoperative changes are again seen and
stable. Endotracheal tube and nasogastric catheter have been removed
in the interval. Mediastinal drain, bilateral thoracostomy catheters
and Swan-Ganz catheter are again noted and stable. No pneumothorax
is seen. Minimal bibasilar atelectasis is noted.
IMPRESSION: Mild bibasilar atelectasis.

No pneumothorax.

Tubes and lines as described.

## 2019-07-15 IMAGING — DX DG CHEST 1V PORT
1 series · 1 of 1 positions shown · non-contrast
Comparison: 01/24/2018

CLINICAL DATA: Chest tube, mitral valve repair

EXAM:
PORTABLE CHEST 1 VIEW

[chest]
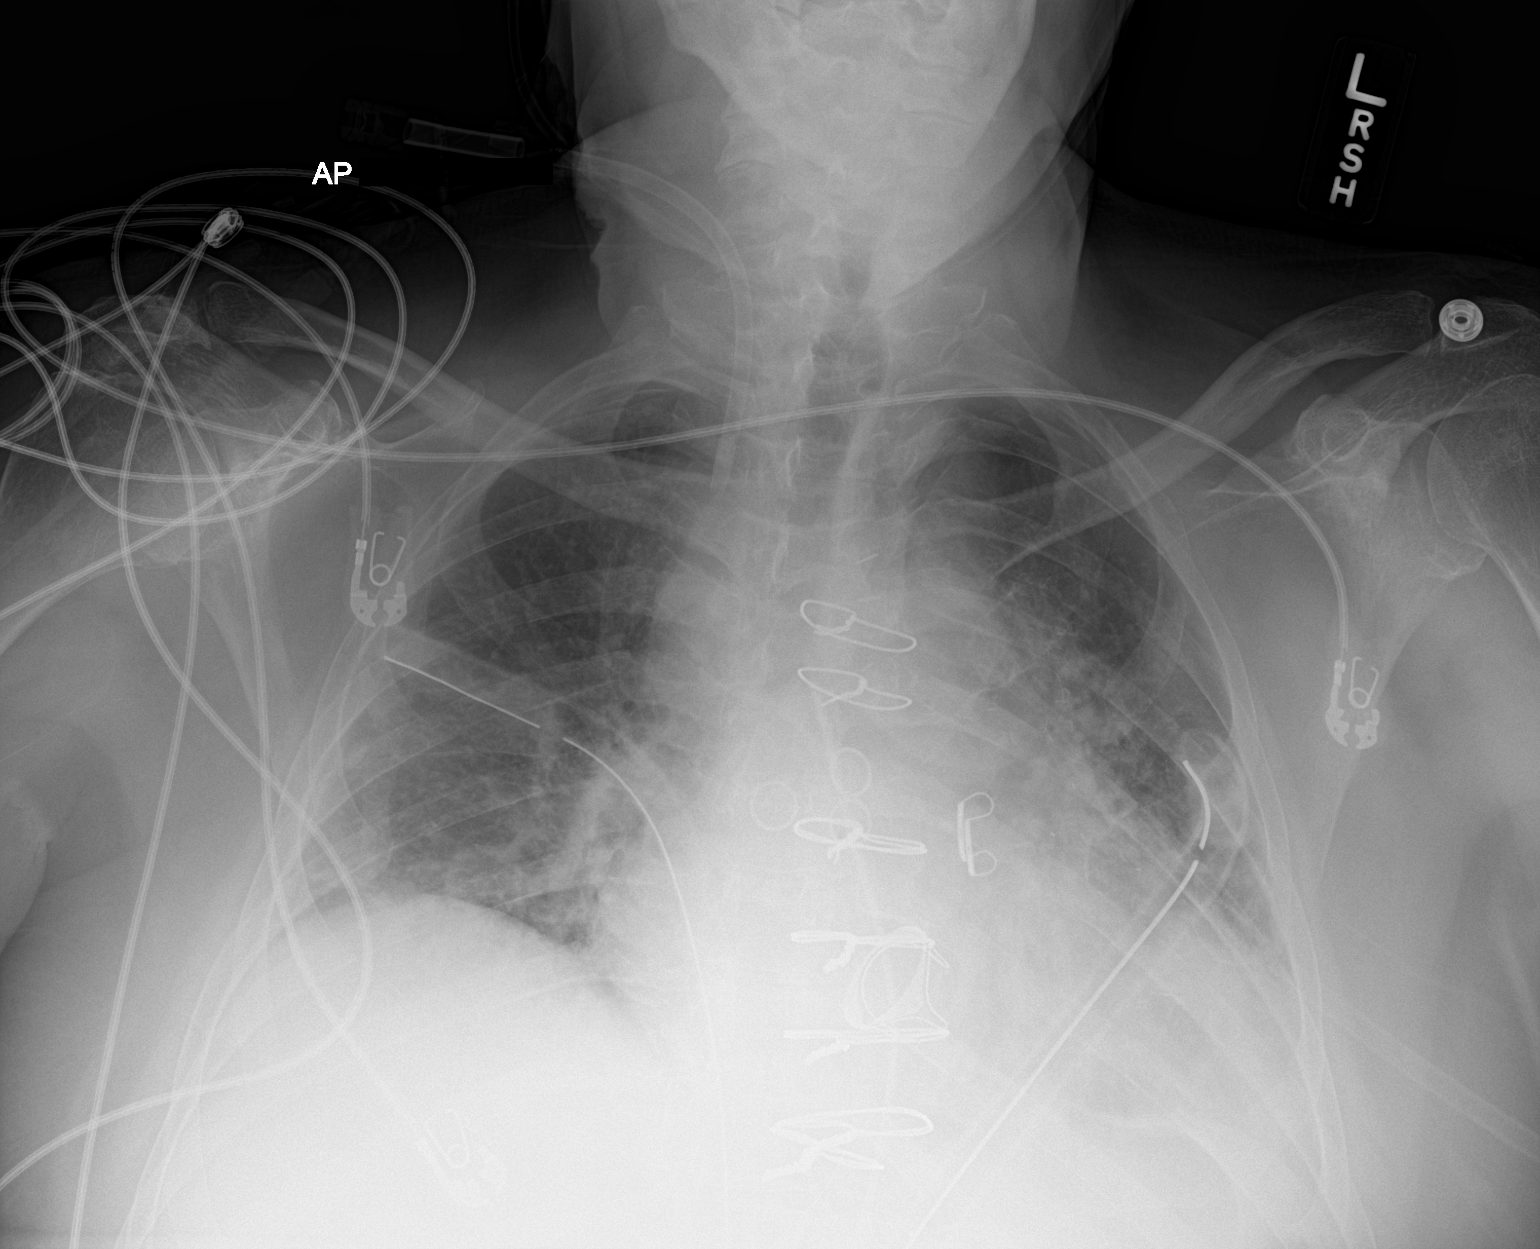

[1 of 1 positions shown; findings below may reference images not displayed]

FINDINGS: Status post median sternotomy, CABG and valve repair. Interval
removal of Swan-Ganz catheter. Bilateral chest tubes are noted.
Small bilateral apical pneumothoraces, left slightly greater than
right. Cardiomegaly with vascular congestion. Bibasilar atelectasis.
IMPRESSION: Bilateral chest tubes in place with small apical pneumothoraces,
left slightly greater than right.

Cardiomegaly with vascular congestion and bibasilar atelectasis.

## 2019-07-15 IMAGING — DX DG CHEST 1V PORT
1 series · 1 of 1 positions shown · non-contrast
Comparison: 01/25/2018 at 4733 hours

CLINICAL DATA: PICC placement.

EXAM:
PORTABLE CHEST 1 VIEW

[chest ap]
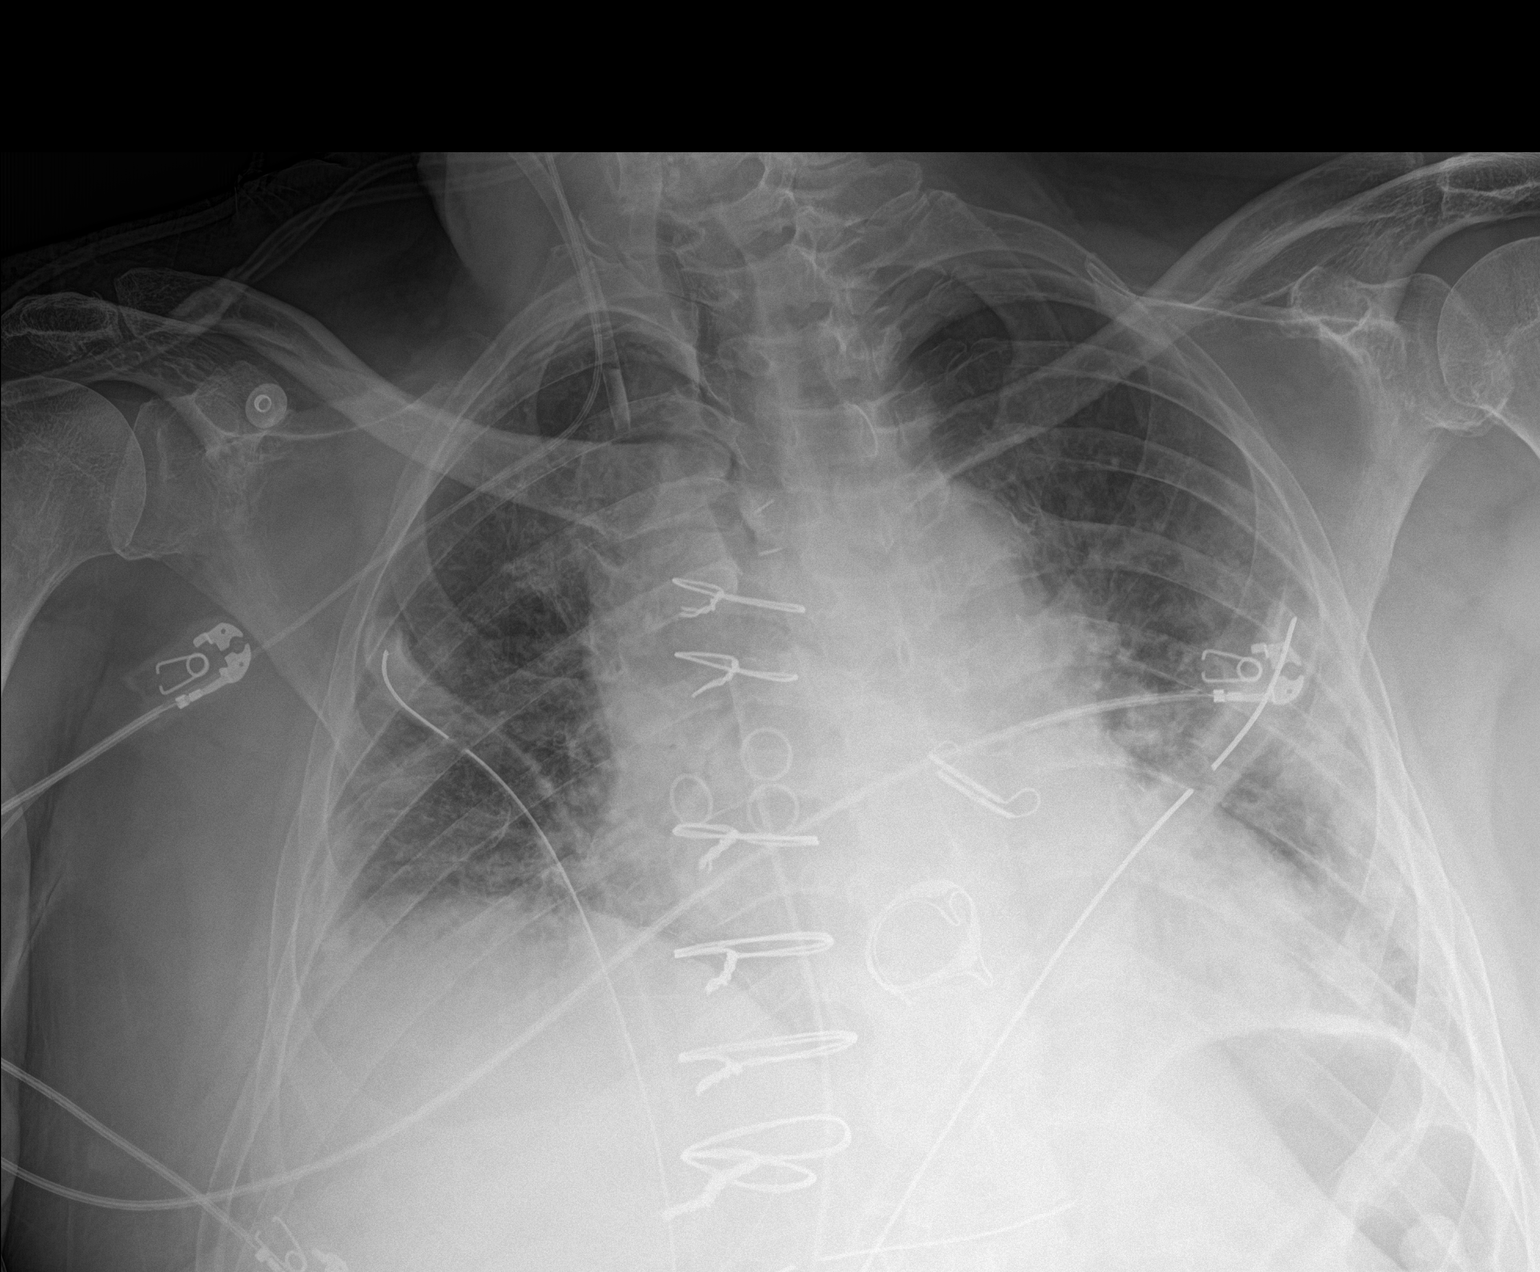

[1 of 1 positions shown; findings below may reference images not displayed]

FINDINGS: The patient is rotated to the right. Sequelae of prior CABG, left
atrial appendage clipping, and mitral valve replacement are again
identified. A right jugular sheath and bilateral chest tubes remain
in place. A new right PICC extends into the right neck along the
course of the internal jugular vein.

The cardiac silhouette remains enlarged. Lung volumes are low with
similar appearance of pulmonary vascular congestion and bibasilar
atelectasis. No sizable pleural effusion is identified. A tiny right
apical pneumothorax is unchanged. There is likely a persistent tiny
left apical pneumothorax although it is less well seen than on the
earlier study.
IMPRESSION: 1. New right PICC terminating in the right neck.
2. Persistent tiny bilateral pneumothoraces.
3. Unchanged pulmonary vascular congestion and bibasilar
atelectasis.

These results will be called to the ordering clinician or
representative by the Radiologist Assistant, and communication
documented in the PACS or zVision Dashboard.

## 2019-07-15 IMAGING — DX DG CHEST 1V PORT
1 series · 1 of 1 positions shown · non-contrast
Comparison: 01/25/2018

CLINICAL DATA: PICC line placement

EXAM:
PORTABLE CHEST 1 VIEW

[chest]
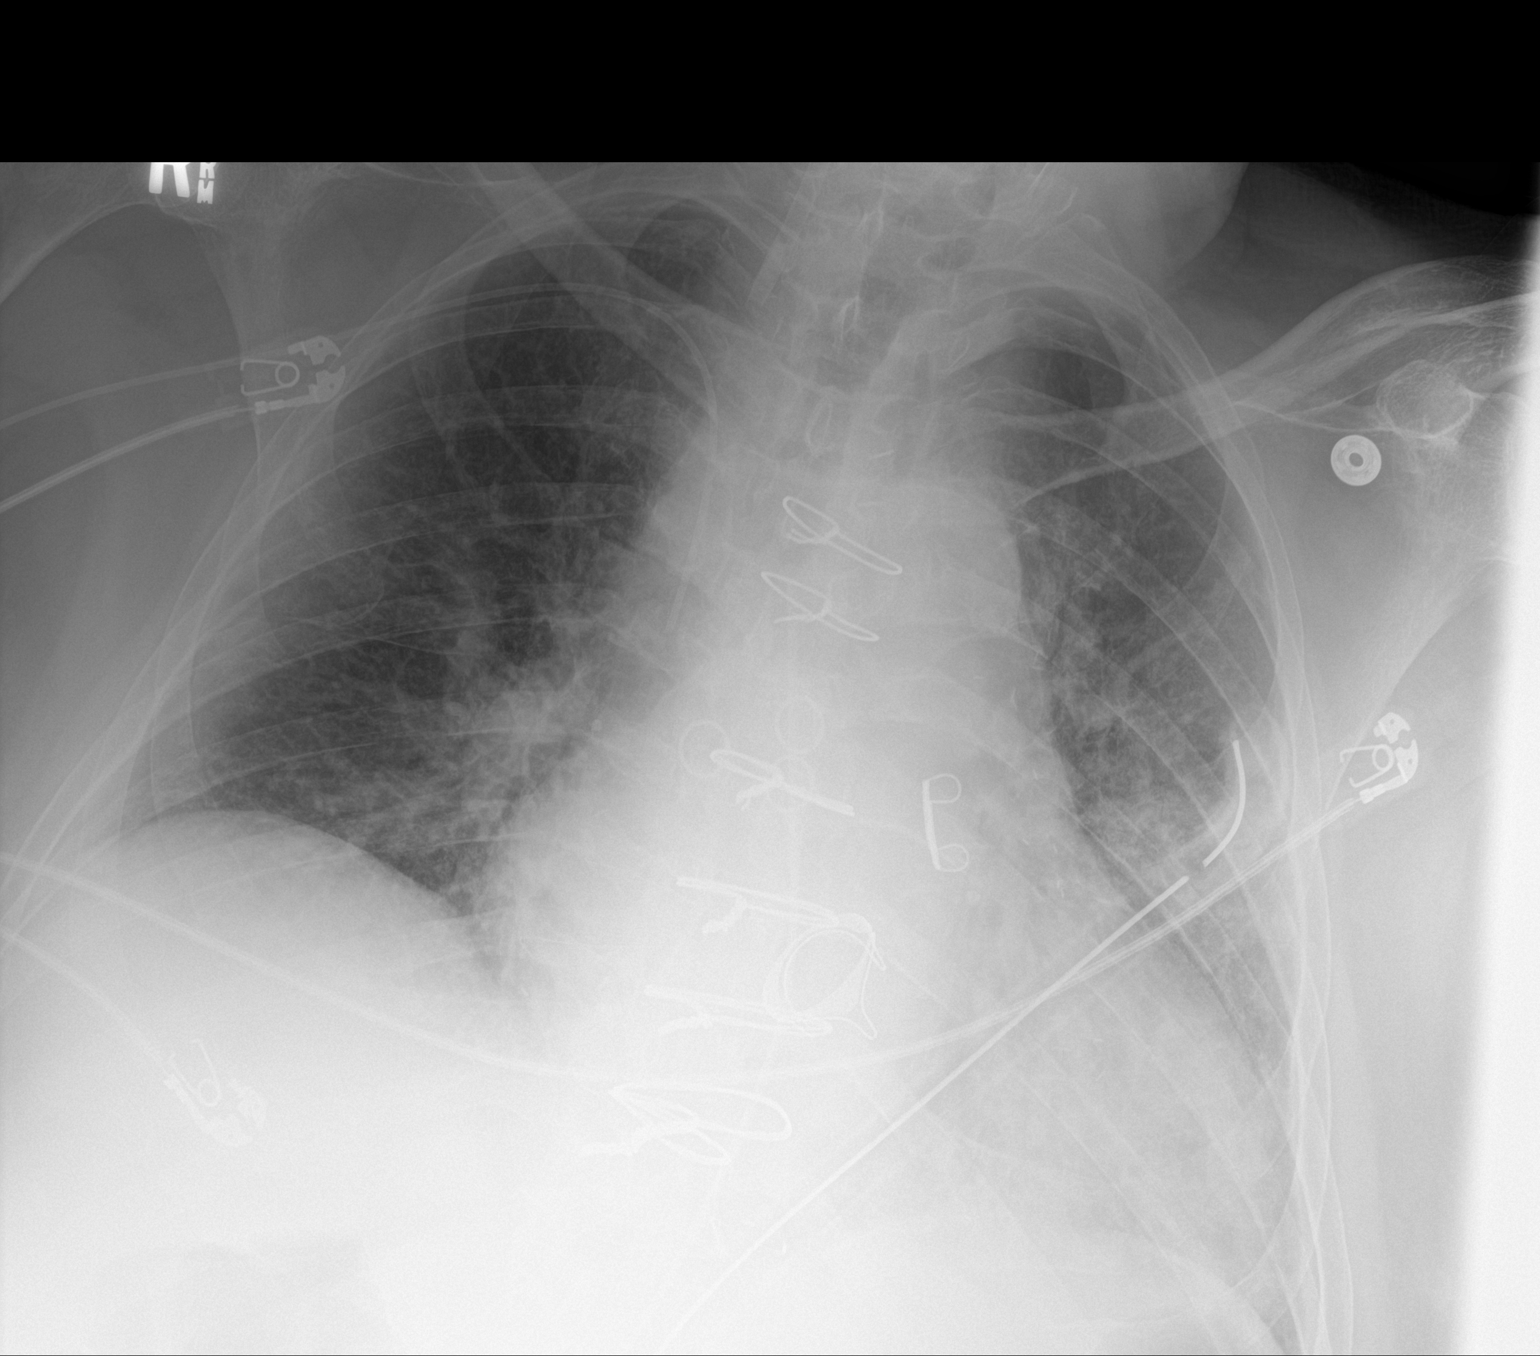

[1 of 1 positions shown; findings below may reference images not displayed]

FINDINGS: Right upper extremity PICC has been repositioned. The tip is at the
cavoatrial junction. Left chest tube is stable. Postoperative
changes in the mediastinum are stable. Vascular congestion and low
volumes are not significantly changed. Right chest tube removed. No
pneumothorax.
IMPRESSION: Right upper extremity PICC tip is now at the cavoatrial junction.

Stable vascular congestion and low volumes.

## 2019-07-16 IMAGING — DX DG CHEST 1V PORT
1 series · 1 of 1 positions shown · non-contrast
Comparison: Radiograph January 25, 2018.

CLINICAL DATA: Pneumothorax.

EXAM:
PORTABLE CHEST 1 VIEW

[chest ap]
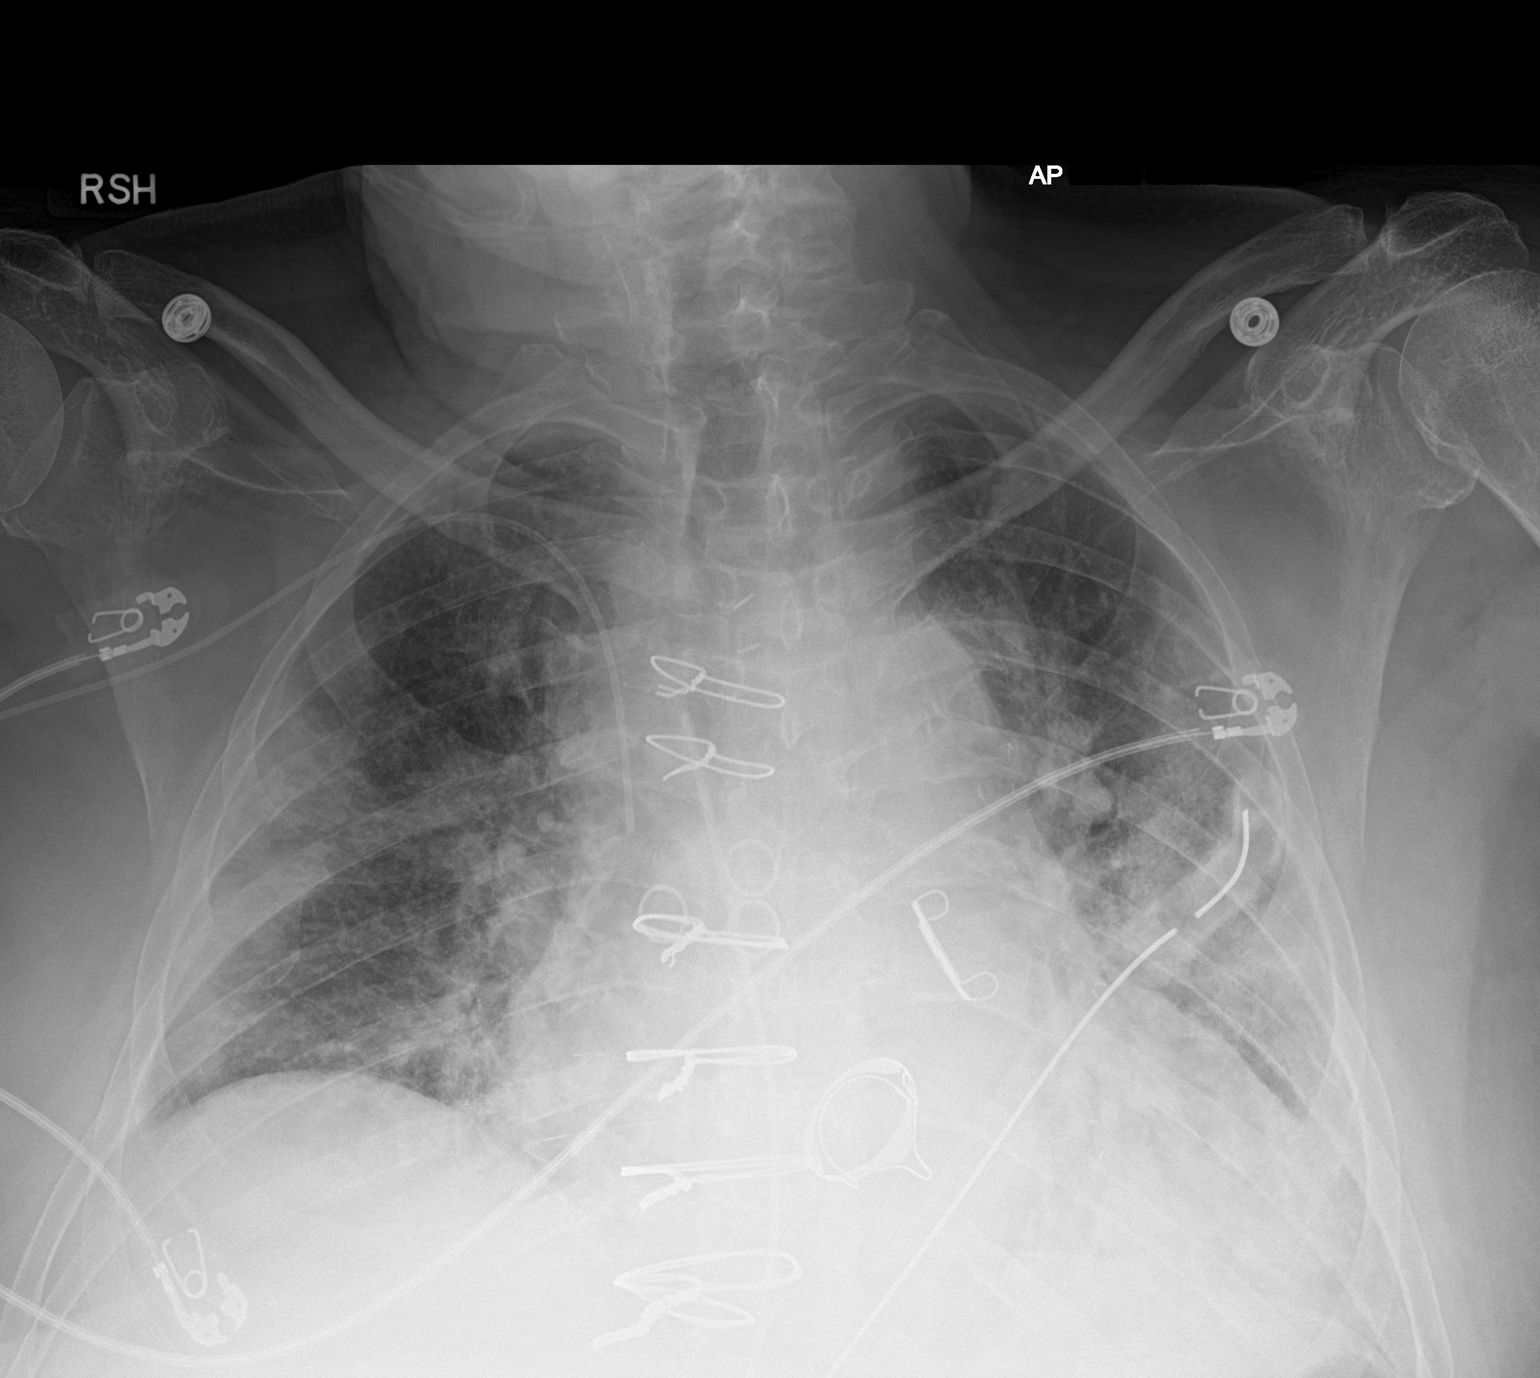

[1 of 1 positions shown; findings below may reference images not displayed]

FINDINGS: Stable cardiomegaly. Status post coronary artery bypass graft and
cardiac valve repair. Right-sided PICC line is unchanged in
position. Left-sided chest tube is unchanged in position. No
pneumothorax is noted. Stable bilateral lower lobe opacities are
noted concerning for atelectasis or possibly edema. Bony thorax is
unremarkable.
IMPRESSION: Stable position of left-sided chest tube without pneumothorax.
Stable bilateral lower lobe opacities are noted as described above.

## 2019-07-24 ENCOUNTER — Telehealth: Payer: Self-pay | Admitting: Cardiology

## 2019-07-24 NOTE — Telephone Encounter (Signed)
New Message    Pt is calling and says he has an appt scheduled for an Echo and he said he was told he didn't need to have the Echo   Please call

## 2019-07-24 NOTE — Telephone Encounter (Signed)
Attempted to call the pt back and he did not answer and VM was not working.

## 2019-07-25 NOTE — Telephone Encounter (Signed)
Tried calling the patient, VM full. 1/27

## 2019-07-25 NOTE — Telephone Encounter (Signed)
Patient called today, he wants to know if he needs to come to the Echo appt on 1/29.

## 2019-07-25 NOTE — Telephone Encounter (Signed)
I spoke to the patient and confirmed Echo ordered at last OV with Dr Dulce Sellar and scheduled on 1/29.  He verbalized understanding.

## 2019-07-27 ENCOUNTER — Other Ambulatory Visit: Payer: Self-pay

## 2019-07-27 ENCOUNTER — Ambulatory Visit (INDEPENDENT_AMBULATORY_CARE_PROVIDER_SITE_OTHER): Payer: Medicare Other

## 2019-07-27 DIAGNOSIS — I2510 Atherosclerotic heart disease of native coronary artery without angina pectoris: Secondary | ICD-10-CM | POA: Diagnosis not present

## 2019-07-27 DIAGNOSIS — Z952 Presence of prosthetic heart valve: Secondary | ICD-10-CM | POA: Diagnosis not present

## 2019-07-27 DIAGNOSIS — I110 Hypertensive heart disease with heart failure: Secondary | ICD-10-CM | POA: Diagnosis not present

## 2019-07-27 DIAGNOSIS — E782 Mixed hyperlipidemia: Secondary | ICD-10-CM

## 2019-07-27 DIAGNOSIS — I480 Paroxysmal atrial fibrillation: Secondary | ICD-10-CM

## 2019-07-27 DIAGNOSIS — I5022 Chronic systolic (congestive) heart failure: Secondary | ICD-10-CM | POA: Diagnosis not present

## 2019-07-27 NOTE — Progress Notes (Unsigned)
2D echocardiogram performed.  

## 2019-07-30 ENCOUNTER — Telehealth: Payer: Self-pay | Admitting: Emergency Medicine

## 2019-07-30 NOTE — Telephone Encounter (Signed)
Called patient informed him and wife of test results. Scheduled patient for a follow up appointment on 08/24/2019. The wife was unable to do a appointment sooner due to her online virtual teaching schedule. Will confirm this appointment is ok with Dr. Bing Matter.

## 2019-07-30 NOTE — Telephone Encounter (Signed)
Nathan [name] he sees me in the office could be to schedule him with

## 2019-07-31 NOTE — Telephone Encounter (Signed)
Yes that date is good

## 2019-08-24 ENCOUNTER — Ambulatory Visit (INDEPENDENT_AMBULATORY_CARE_PROVIDER_SITE_OTHER): Payer: Medicare Other | Admitting: Cardiology

## 2019-08-24 ENCOUNTER — Encounter: Payer: Self-pay | Admitting: Cardiology

## 2019-08-24 ENCOUNTER — Other Ambulatory Visit: Payer: Self-pay

## 2019-08-24 VITALS — BP 104/62 | HR 83 | Temp 97.7°F | Ht 71.0 in | Wt 193.0 lb

## 2019-08-24 DIAGNOSIS — Z952 Presence of prosthetic heart valve: Secondary | ICD-10-CM | POA: Diagnosis not present

## 2019-08-24 DIAGNOSIS — Z7901 Long term (current) use of anticoagulants: Secondary | ICD-10-CM

## 2019-08-24 DIAGNOSIS — E782 Mixed hyperlipidemia: Secondary | ICD-10-CM

## 2019-08-24 DIAGNOSIS — I480 Paroxysmal atrial fibrillation: Secondary | ICD-10-CM

## 2019-08-24 DIAGNOSIS — I5022 Chronic systolic (congestive) heart failure: Secondary | ICD-10-CM

## 2019-08-24 DIAGNOSIS — I2510 Atherosclerotic heart disease of native coronary artery without angina pectoris: Secondary | ICD-10-CM | POA: Diagnosis not present

## 2019-08-24 DIAGNOSIS — I447 Left bundle-branch block, unspecified: Secondary | ICD-10-CM

## 2019-08-24 NOTE — Progress Notes (Signed)
Cardiology Office Note:    Date:  08/24/2019   ID:  Nathan [name]., DOB 08-13-1945, MRN [id number]  PCP:  [name], MD  Cardiologist:  Norman Herrlich, MD    Referring MD: [name], MD    ASSESSMENT:    1. Chronic systolic (congestive) heart failure (HCC)   2. CAD in native artery   3. S/P MVR (mitral valve replacement)   4. PAF (paroxysmal atrial fibrillation) (HCC)   5. Chronic anticoagulation   6. Mixed hyperlipidemia   7. LBBB (left bundle branch block)    PLAN:    In order of problems listed above:  1. His heart failure is compensated New York Heart Association class I continue current treatment including distal diuretic minimal dose of beta-blocker and Entresto and cannot uptitrate due to hypotension.  Presently not on a loop diuretic. 2. CAD is stable continue warfarin and lipid-lowering therapy with statin 3. Stable function recent echocardiogram 4. Stable maintain sinus rhythm continue warfarin with prosthetic valve.  I did tell him that recent information suggests that a direct anticoagulant is an option he declines 5. Lipids are ideal continue a statin 6. If device is chosen marijuana consider CRT ICD to improve the quality of his life   Next appointment: 4 months   Medication Adjustments/Labs and Tests Ordered: Current medicines are reviewed at length with the patient today.  Concerns regarding medicines are outlined above.  No orders of the defined types were placed in this encounter.  No orders of the defined types were placed in this encounter.   Chief Complaint  Patient presents with  . Follow-up  . Congestive Heart Failure    History of Present Illness:    Nathan [name]. is a 74 y.o. male with a hx of CABG-bioprosthetic mitral valve replacement-left atrial appendage  closure, heart failure left bundle branch block hypertensive heart disease and paroxysmal atrial fibrillation with post open heart surgery stroke seen  04/28/2018.  Prior to that visit ejection fraction was 20% echocardiogram at 13% by [facility] at [facility] He was last seen 05/30/2019. Reviewed the results of the echocardiogram with patient and his wife  Echo 07/27/2019: His residuals ventricular dysfunction and normal mitral valve bioprosthetic function. 1. Left ventricular ejection fraction, by visual estimation, is 20 to  25%. The left ventricle has normal function. Left ventricular septal wall  thickness was mildly increased. Moderately increased left ventricular  posterior wall thickness. There is  mildly increased left ventricular hypertrophy.  2. Global right ventricle has moderately reduced systolic function.The  right ventricular size is normal. No increase in right ventricular wall  thickness.  3. Left atrial size was normal.  4. Right atrial size was normal.  5. 29 mm Mitral Magna Ease Pericardial bioprosthetic valve is present in  the mitral position.  6. The mitral valve has been repaired/replaced. No evidence of mitral  valve regurgitation. No evidence of dysfunction.  7. The tricuspid valve is normal in structure.  8. The tricuspid valve is normal in structure. Tricuspid valve  regurgitation is not demonstrated.  9. The aortic valve is tricuspid. Aortic valve regurgitation is not  visualized. Mild aortic valve sclerosis without stenosis.  10. The pulmonic valve was normal in structure. Pulmonic valve  regurgitation is trivial.  11. The inferior vena cava is normal in size with greater than 50%  respiratory variability, suggesting right atrial pressure of 3 mmHg.  12. Mild to moderately dilated left ventricular internal cavity size.  13.  The left ventricle demonstrates global hypokinesis.  14. Mean gradient 3 mm Hg MVA 2.50 cm2   Normal MVR appearance and function.  15. Left ventricular diastolic parameters are consistent with Grade II  diastolic dysfunction (pseudonormalization).  16. Elevated left  atrial and left ventricular end-diastolic pressures.    Ref Range & Units 2 mo ago 1 yr ago  NT-Pro BNP 0 - 376 pg/mL 1,031High   817High  CM    Seen in the office along with his wife to discuss his ongoing severe cardiomyopathy and the option of ICD therapy.  Both of them have a good understanding of his illness and prognosis he is very pleased with the quality of his life presently he is New York Heart Association class I and tells me he will not except an ICD and understands consistent risk of sudden death.  I told the patient I respected his decision to change his mind contact me and encourage his wife to know certifying CPR.  He is having no edema shortness of breath chest pain palpitation or syncope he is compliant with self-management.  He is compliant with medications.  Past Medical History:  Diagnosis Date  . Atrial fibrillation with RVR (HCC) 05/01/2017  . Cardiomyopathy (HCC) 05/01/2017  . CHF (congestive heart failure) (HCC)   . 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 Coronary artery disease   . DM (diabetes mellitus) type 2, uncontrolled, with ketoacidosis (HCC) 05/01/2017  . Essential hypertension 11/26/2016  . History of kidney stones   . HLD (hyperlipidemia) 05/01/2017  . IVCD (intraventricular conduction defect) 04/24/2015  . Mitral valve regurgitation   . Mixed dyslipidemia 11/26/2016  . Postoperative urinary retention 02/23/2018  . 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 BRAIN SURGERY    . CARDIAC CATHETERIZATION    . CORONARY ARTERY BYPASS GRAFT N/A 01/23/2018   Procedure: CORONARY ARTERY BYPASS GRAFTING (CABG) x 4 WITH ENDOSCOPIC HARVESTING OF RIGHT GREATER SAPHENOUS VEIN: LIMA TO LAD, SVG TO RCA, SVG TO DIAG, SVG TO OM ;  Surgeon: Kerin Perna, MD;  Location: [facility] OR;  Service: Open Heart Surgery;  Laterality: N/A;  . IR PERCUTANEOUS ART THROMBECTOMY/INFUSION INTRACRANIAL INC DIAG ANGIO   04/29/2017  . IR RADIOLOGIST EVAL & MGMT  05/17/2017  . IR US GUIDE VASC ACCESS LEFT  04/29/2017  . IR US GUIDE VASC ACCESS RIGHT  04/29/2017  . LEFT ATRIAL APPENDAGE OCCLUSION Left 01/23/2018   Procedure: LEFT ATRIAL APPENDAGE OCCLUSION USING ATRICURE ATRICLIP PRO2 LAA EXCLUSION SYSTEM  SIZE 40, LOT # [id number], CAT # [id number], EXP. DATE 2020-04-28;  Surgeon: Donata Clay, Theron Arista, MD;  Location: [facility] OR;  Service: Open Heart Surgery;  Laterality: Left;  . MITRAL VALVE REPLACEMENT N/A 01/23/2018   Procedure: MITRAL VALVE (MV) REPLACEMENT USING MAGNA MITRAL EASE PERICARDIAL BIOPROSTHESIS, MODEL [id number], SIZE 29 MM, SERIAL # [id number], EXPIRATION  DATE 2021-03-03.;  Surgeon: Kerin Perna, MD;  Location: [facility] OR;  Service: Open Heart Surgery;  Laterality: N/A;  . MULTIPLE EXTRACTIONS WITH ALVEOLOPLASTY N/A 12/26/2017   Procedure: Extraction of tooth #'s 4,6-11, 18 -27, 30, and 31 with alveoloplasty;  Surgeon: [name], DDS;  Location: [facility] OR;  Service: Oral Surgery;  Laterality: N/A;  . RADIOLOGY WITH ANESTHESIA N/A 04/29/2017   Procedure: IR WITH ANESTHESIA;  Surgeon: Radiologist, Medication, MD;  Location: [facility] OR;  Service: Radiology;  Laterality: N/A;  . RIGHT/LEFT HEART CATH AND CORONARY ANGIOGRAPHY N/A 08/18/2017  Procedure: RIGHT/LEFT HEART CATH AND CORONARY ANGIOGRAPHY;  Surgeon: Nelva Bush, MD;  Location: [place] [facility] LAB;  Service: Cardiovascular;  Laterality: N/A;  . TEE WITHOUT CARDIOVERSION N/A 09/22/2017   Procedure: TRANSESOPHAGEAL ECHOCARDIOGRAM (TEE);  Surgeon: Lelon Perla, MD;  Location: [facility] ENDOSCOPY;  Service: Cardiovascular;  Laterality: N/A;  . TEE WITHOUT CARDIOVERSION N/A 01/23/2018   Procedure: TRANSESOPHAGEAL ECHOCARDIOGRAM (TEE);  Surgeon: Prescott Gum, Collier Salina, MD;  Location: [place];  Service: Open Heart Surgery;  Laterality: N/A;  . TONSILLECTOMY      Current Medications: Current Meds  Medication Sig  . bethanechol (URECHOLINE) 50 MG tablet Take 50 mg by mouth 2 (two) times  daily.   . carvedilol (COREG) 3.125 MG tablet TAKE 1 TABLET(3.125 MG) BY MOUTH DAILY AFTER SUPPER  . ergocalciferol (VITAMIN D2) 1.25 MG (50000 UT) capsule Take by mouth.  . pantoprazole (PROTONIX) 40 MG tablet Take 1 tablet (40 mg total) by mouth daily.  . pravastatin (PRAVACHOL) 20 MG tablet Take 20 mg by mouth daily.  . sacubitril-valsartan (ENTRESTO) 24-26 MG Take 1 tablet by mouth daily with breakfast.  . spironolactone (ALDACTONE) 25 MG tablet TAKE 1/2 TABLET(12.5 MG) BY MOUTH DAILY  . tamsulosin (FLOMAX) 0.4 MG CAPS capsule Take 0.4 mg by mouth at bedtime.      Allergies:   Fentanyl, Promethazine hcl, Foeniculum vulgare, Metformin and related, Penicillins, and Sulfa antibiotics   Social History   Socioeconomic History  . Marital status: Married    Spouse name: Not on file  . Number of children: Not on file  . Years of education: Not on file  . Highest education level: Not on file  Occupational History  . Not on file  Tobacco Use  . Smoking status: Former Smoker    Types: Cigarettes    Quit date: 1994    Years since quitting: 27.1  . Smokeless tobacco: Never Used  Substance and Sexual Activity  . Alcohol use: Not Currently    Alcohol/week: 3.0 standard drinks    Types: 1 Glasses of wine, 1 Cans of beer, 1 Standard drinks or equivalent per week    Comment: mix drink  . Drug use: No  . Sexual activity: Not on file  Other Topics Concern  . Not on file  Social History Narrative  . Not on file   Social Determinants of Health   Financial Resource Strain:   . Difficulty of Paying Living Expenses: Not on file  Food Insecurity:   . Worried About Charity fundraiser in the Last Year: Not on file  . Ran Out of Food in the Last Year: Not on file  Transportation Needs:   . Lack of Transportation (Medical): Not on file  . Lack of Transportation (Non-Medical): Not on file  Physical Activity:   . Days of Exercise per Week: Not on file  . Minutes of Exercise per Session: Not  on file  Stress:   . Feeling of Stress : Not on file  Social Connections:   . Frequency of Communication with Friends and Family: Not on file  . Frequency of Social Gatherings with Friends and Family: Not on file  . Attends Religious Services: Not on file  . Active Member of Clubs or Organizations: Not on file  . Attends Archivist Meetings: Not on file  . Marital Status: Not on file     Family History: The patient's family history includes Diabetes in his father; Hypertension in his father. ROS:   Please see the  history of present illness.    All other systems reviewed and are negative.  EKGs/Labs/Other Studies Reviewed:    The following studies were reviewed today:    Recent Labs: Stable renal function CBC and proBNP level is reflection of heart failure.  His lipids are ideal 05/30/2019: ALT 15; BUN 21; Creatinine, Ser 1.12; Hemoglobin 13.6; NT-Pro BNP 1,031; Platelets 176; Potassium 4.8; Sodium 140  Recent Lipid Panel    Component Value Date/Time   CHOL 145 05/30/2019 1710   TRIG 138 05/30/2019 1710   HDL 36 (L) 05/30/2019 1710   CHOLHDL 4.0 05/30/2019 1710   CHOLHDL 3.4 04/29/2017 0451   VLDL 21 04/29/2017 0451   LDLCALC 84 05/30/2019 1710    Physical Exam:    VS:  BP 104/62   Pulse 83   Temp 97.7 F (36.5 C)   Ht 5\' 11"  (1.803 m)   Wt 193 lb (87.5 kg)   SpO2 98%   BMI 26.92 kg/m     Wt Readings from Last 3 Encounters:  08/24/19 193 lb (87.5 kg)  05/30/19 192 lb (87.1 kg)  12/27/18 180 lb (81.6 kg)     GEN: He does not appear chronically ill well nourished, well developed in no acute distress HEENT: Normal NECK: No JVD; No carotid bruits LYMPHATICS: No lymphadenopathy CARDIAC: RRR, no murmurs, rubs, gallops RESPIRATORY:  Clear to auscultation without rales, wheezing or rhonchi  ABDOMEN: Soft, non-tender, non-distended MUSCULOSKELETAL:  No edema; No deformity  SKIN: Warm and dry NEUROLOGIC:  Alert and oriented x 3 PSYCHIATRIC:  Normal  affect    Signed, 02/27/19, MD  08/24/2019 5:17 PM    [facility]

## 2019-08-24 NOTE — Patient Instructions (Signed)

## 2019-09-05 ENCOUNTER — Ambulatory Visit: Payer: Medicare Other | Admitting: Sports Medicine

## 2019-09-15 ENCOUNTER — Other Ambulatory Visit: Payer: Self-pay | Admitting: Cardiology

## 2019-09-21 ENCOUNTER — Other Ambulatory Visit: Payer: Self-pay | Admitting: Cardiology

## 2019-09-26 ENCOUNTER — Telehealth: Payer: Self-pay | Admitting: *Deleted

## 2019-09-26 MED ORDER — SACUBITRIL-VALSARTAN 24-26 MG PO TABS: 1.0000 | ORAL_TABLET | Freq: Every day | ORAL | 3 refills | Status: DC

## 2019-09-26 NOTE — Telephone Encounter (Signed)
Rx refill sent to pharmacy. 

## 2019-10-26 ENCOUNTER — Ambulatory Visit: Payer: Medicare Other | Admitting: Sports Medicine

## 2019-10-26 ENCOUNTER — Encounter: Payer: Self-pay | Admitting: Sports Medicine

## 2019-10-26 ENCOUNTER — Other Ambulatory Visit: Payer: Self-pay

## 2019-10-26 DIAGNOSIS — D689 Coagulation defect, unspecified: Secondary | ICD-10-CM

## 2019-10-26 DIAGNOSIS — B351 Tinea unguium: Secondary | ICD-10-CM

## 2019-10-26 DIAGNOSIS — Z8673 Personal history of transient ischemic attack (TIA), and cerebral infarction without residual deficits: Secondary | ICD-10-CM

## 2019-10-26 DIAGNOSIS — M79675 Pain in left toe(s): Secondary | ICD-10-CM

## 2019-10-26 DIAGNOSIS — M79674 Pain in right toe(s): Secondary | ICD-10-CM | POA: Diagnosis not present

## 2019-10-26 NOTE — Progress Notes (Signed)
Subjective: Nathan [name]. is a 74 y.o. male patient seen today in office with complaint of mildly painful thickened and elongated toenails; unable to trim. Patient admits that he is still on Warafin.  Denies any other complaints at this time.  Patient Active Problem List   Diagnosis Date Noted  . Screening for colon cancer 06/21/2019  . Vitamin D deficiency 06/21/2019  . Chronic systolic (congestive) heart failure ([place]) 07/06/2018  . GERD without esophagitis 07/06/2018  . Ischemic cardiomyopathy 04/28/2018  . Hematuria 02/23/2018  . Bacterial UTI   . History of CVA with residual deficit   . Benign prostatic hyperplasia with urinary retention   . History of CVA (cerebrovascular accident) without residual deficits   . PAF (paroxysmal atrial fibrillation) ([place])   . Anemia of chronic disease   . Debility 02/03/2018  . 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 Pressure injury of skin 02/02/2018  . Malnutrition of moderate degree 02/01/2018  . Coronary artery disease involving native coronary artery of native heart with angina pectoris ([place])   . S/P CABG (coronary artery bypass graft)   . Atrial fibrillation ([place])   . Chronic combined systolic and diastolic CHF (congestive heart failure) ([place])   . Diabetes mellitus type 2 in nonobese (HCC)   . Essential hypertension   . History of CVA (cerebrovascular accident)   . Acute blood loss anemia   . Hyponatremia   . S/P left atrial appendage ligation 01/26/2018  . S/P CABG x 4 01/23/2018  . S/P MVR (mitral valve replacement) 01/23/2018  . Situational mixed anxiety and depressive disorder 11/05/2017  . Mitral valve insufficiency   . On amiodarone therapy 07/28/2017  . CAD in native artery 07/28/2017  . Chronic anticoagulation 05/18/2017  . LBBB (left bundle branch block) 05/18/2017  . Hospital discharge follow-up 05/09/2017  . Paroxysmal atrial fibrillation ([place]) 05/01/2017  . Dilated  cardiomyopathy ([place]) 05/01/2017  . DM (diabetes mellitus) type 2, uncontrolled, with ketoacidosis ([place]) 05/01/2017  . Hypotension 05/01/2017  . HLD (hyperlipidemia) 05/01/2017  . Cerebrovascular accident (CVA) due to embolism of right middle cerebral artery ([place]) 04/29/2017  . Hypertensive heart disease with heart failure ([place]) 11/26/2016  . Mixed dyslipidemia 11/26/2016  . Chronic systolic congestive heart failure ([place]) 04/24/2015  . IVCD (intraventricular conduction defect) 04/24/2015    Current Outpatient Medications on File Prior to Visit  Medication Sig Dispense Refill  . bethanechol (URECHOLINE) 50 MG tablet Take 50 mg by mouth 2 (two) times daily.     . carvedilol (COREG) 3.125 MG tablet TAKE 1 TABLET(3.125 MG) BY MOUTH DAILY AFTER SUPPER 90 tablet 2  . ergocalciferol (VITAMIN D2) 1.25 MG (50000 UT) capsule Take by mouth.    . pantoprazole (PROTONIX) 40 MG tablet Take 1 tablet (40 mg total) by mouth daily.    . pravastatin (PRAVACHOL) 20 MG tablet Take 20 mg by mouth daily.    . sacubitril-valsartan (ENTRESTO) 24-26 MG Take 1 tablet by mouth daily with breakfast. 90 tablet 3  . spironolactone (ALDACTONE) 25 MG tablet TAKE 1/2 TABLET(12.5 MG) BY MOUTH DAILY 45 tablet 2  . tamsulosin (FLOMAX) 0.4 MG CAPS capsule Take 0.4 mg by mouth at bedtime.   3  . warfarin (COUMADIN) 1 MG tablet Take 0.5 mg by mouth daily at 2 PM.    . warfarin (COUMADIN) 3 MG tablet Take 3 mg by mouth daily.     No current facility-administered medications on file prior to visit.  Allergies  Allergen Reactions  . Fentanyl Shortness Of Breath  . Promethazine Hcl Anaphylaxis and Other (See Comments)    Cardiac arrest  . Foeniculum Vulgare Other (See Comments)    Fennel bulbs--nausea only  . Metformin And Related Diarrhea  . Penicillins Nausea Only    Has patient had a PCN reaction causing immediate rash, facial/tongue/throat swelling, SOB or lightheadedness with hypotension: no Has patient had a PCN  reaction causing severe rash involving mucus membranes or skin necrosis: no Has patient had a PCN reaction that required hospitalization: no Has patient had a PCN reaction occurring within the last 10 years: yes If all of the above answers are "NO", then may proceed with Cephalosporin use.   . Sulfa Antibiotics Other (See Comments)    G.I. Upset    Objective: Physical Exam  General: Well developed, nourished, no acute distress, awake, alert and oriented x 3  Vascular: Dorsalis pedis artery 2/4 bilateral, Posterior tibial artery 1/4 bilateral, skin temperature warm to warm proximal to distal bilateral lower extremities, no varicosities, pedal hair present bilateral.  Neurological: Gross sensation present via light touch bilateral.   Dermatological: Skin is warm, dry, and supple bilateral, Nails 1-10 are tender, long, thick, and discolored with mild subungal debris, no webspace macerations present bilateral, no open lesions present bilateral, no callus/corns/hyperkeratotic tissue present bilateral. No signs of infection bilateral.  Musculoskeletal: No symptomatic boney deformities noted bilateral. Muscular strength within normal limits without painon range of motion. No pain with calf compression bilateral.  Assessment and Plan:  Problem List Items Addressed This Visit      Other   History of CVA (cerebrovascular accident) without residual deficits    Other Visit Diagnoses    Pain due to onychomycosis of toenails of both feet    -  Primary   Coagulation defect (HCC)          -Examined patient.  -Re-discussed treatment options for painful mycotic nails. -Mechanically debrided and reduced mycotic nails with sterile nail nipper and dremel nail file without incident. -Patient to return in 3 months for follow up evaluation or sooner if symptoms worsen.  Asencion Islam, DPM

## 2019-11-21 ENCOUNTER — Ambulatory Visit (INDEPENDENT_AMBULATORY_CARE_PROVIDER_SITE_OTHER): Payer: Medicare Other | Admitting: Cardiology

## 2019-11-21 ENCOUNTER — Encounter: Payer: Self-pay | Admitting: Cardiology

## 2019-11-21 ENCOUNTER — Other Ambulatory Visit: Payer: Self-pay

## 2019-11-21 VITALS — BP 140/88 | HR 60 | Ht 71.0 in | Wt 202.0 lb

## 2019-11-21 DIAGNOSIS — Z7901 Long term (current) use of anticoagulants: Secondary | ICD-10-CM | POA: Diagnosis not present

## 2019-11-21 DIAGNOSIS — I110 Hypertensive heart disease with heart failure: Secondary | ICD-10-CM | POA: Diagnosis not present

## 2019-11-21 DIAGNOSIS — I2510 Atherosclerotic heart disease of native coronary artery without angina pectoris: Secondary | ICD-10-CM

## 2019-11-21 DIAGNOSIS — I480 Paroxysmal atrial fibrillation: Secondary | ICD-10-CM | POA: Diagnosis not present

## 2019-11-21 DIAGNOSIS — E782 Mixed hyperlipidemia: Secondary | ICD-10-CM

## 2019-11-21 DIAGNOSIS — I5022 Chronic systolic (congestive) heart failure: Secondary | ICD-10-CM | POA: Diagnosis not present

## 2019-11-21 DIAGNOSIS — Z952 Presence of prosthetic heart valve: Secondary | ICD-10-CM

## 2019-11-21 MED ORDER — SACUBITRIL-VALSARTAN 24-26 MG PO TABS: 1.0000 | ORAL_TABLET | Freq: Two times a day (BID) | ORAL | 3 refills | Status: AC

## 2019-11-21 NOTE — Patient Instructions (Addendum)
Medication Instructions:  Your physician has recommended you make the following change in your medication:  INCREASE: Entresto 24-26 take one tablet by mouth twice daily.  *If you need a refill on your cardiac medications before your next appointment, please call your pharmacy*   Lab Work: Your physician recommends that you return for lab work in: TODAY CMP, Lipids, ProBNP If you have labs (blood work) drawn today and your tests are completely normal, you will receive your results only by: [name] MyChart Message (if you have MyChart) OR . A paper copy in the mail If you have any lab test that is abnormal or we need to change your treatment, we will call you to review the results.   Testing/Procedures: None   Follow-Up: At [facility], you and your health needs are our priority.  As part of our continuing mission to provide you with exceptional heart care, we have created designated Provider Care Teams.  These Care Teams include your primary Cardiologist (physician) and Advanced Practice Providers (APPs -  Physician Assistants and Nurse Practitioners) who all work together to provide you with the care you need, when you need it.  We recommend signing up for the patient portal called "MyChart".  Sign up information is provided on this After Visit Summary.  MyChart is used to connect with patients for Virtual Visits (Telemedicine).  Patients are able to view lab/test results, encounter notes, upcoming appointments, etc.  Non-urgent messages can be sent to your provider as well.   To learn more about what you can do with MyChart, go to [email or web address].    Your next appointment:   3 month(s)  The format for your next appointment:   In Person  Provider:   Norman Herrlich, MD   Other Instructions

## 2019-11-21 NOTE — Progress Notes (Signed)
Cardiology Office Note:    Date:  11/21/2019   ID:  Nathan Weinreb., DOB Mar 04, 1946, MRN [id number]  PCP:  Myrlene Broker, MD  Cardiologist:  Shirlee More, MD    Referring MD: Myrlene Broker, MD    ASSESSMENT:    1. Chronic systolic (congestive) heart failure ([place])   2. PAF (paroxysmal atrial fibrillation) ([place])   3. Chronic anticoagulation   4. Hypertensive heart disease with heart failure ([place])   5. CAD in native artery   6. S/P MVR (mitral valve replacement)   7. Mixed hyperlipidemia    PLAN:    In order of problems listed above:  1. Stable compensated he has no fluid overload and denies any symptoms of exercise intolerance or shortness of breath.  I find it amazing that he functions at this level with his severe LV dysfunction we will up his Entresto to twice daily next visit go the mid range dose.  Continue minimal dose of beta-blocker and MRA.  Recheck labs including renal function potassium proBNP 2. Stable continue beta-blocker anticoagulation 3. Stable CAD mitral valve repair 4. Continue with statin recheck lipids liver function   Next appointment: 3 months   Medication Adjustments/Labs and Tests Ordered: Current medicines are reviewed at length with the patient today.  Concerns regarding medicines are outlined above.  Orders Placed This Encounter  Procedures  . Comp Met (CMET)  . Lipid Profile  . Pro b natriuretic peptide   No orders of the defined types were placed in this encounter.   Chief Complaint  Patient presents with  . Follow-up    3 MO FU     History of Present Illness:    Nathan D Derryck Shahan. is a 74 y.o. male with a hx of  CABG-bioprosthetic mitral valve replacement-left atrial closure, heart failure left bundle branch block hypertensive heart disease and paroxysmal atrial fibrillation with post open heart surgery stroke seen 04/28/2018.  Prior to that visit ejection fraction was 20% echocardiogram at 13% by [facility]..   He was last seen 08/24/2019. Compliance with diet, lifestyle and medications: Yes He has continued severe left ventricular dysfunction with an ejection fraction of 20 to 25% and normal prosthetic MVR tissue function. Echo 07/27/2019:  1. Left ventricular ejection fraction, by visual estimation, is 20 to  25%. The left ventricle has normal function. Left ventricular septal wall  thickness was mildly increased. Moderately increased left ventricular  posterior wall thickness. There is  mildly increased left ventricular hypertrophy.  2. Global right ventricle has moderately reduced systolic function.The  right ventricular size is normal. No increase in right ventricular wall  thickness.  3. Left atrial size was normal.  4. Right atrial size was normal.  5. 29 mm Mitral Magna Ease Pericardial bioprosthetic valve is present in the mitral position.  6. The mitral valve has been repaired/replaced. No evidence of mitral  valve regurgitation. No evidence of dysfunction.  BNP levels remain significantly elevated  Ref Range & Units 5 mo ago 1 yr ago  NT-Pro BNP 0 - 376 pg/mL 1,031High   817High    As always his wife is present participates in the discussion and medical decision making.  In general he is doing much better he is active he has had no cardiovascular symptoms of exercise intolerance palpitation dyspnea chest pain or syncope.  He still has not received Covid but 19 vaccine.  He made a decision not to accept an ICD.  He tolerates  his guideline directed therapy his blood pressure is relatively high but will increase his Entresto twice daily and try to get to mid range dose at his next office visit.  If he were to have syncope I would ask him to reconsider device therapy Past Medical History:  Diagnosis Date  . Acute blood loss anemia   . Anemia of chronic disease   . Atrial fibrillation ([place])   . Atrial fibrillation with RVR ([place]) 05/01/2017  . Bacterial UTI   . Benign  prostatic hyperplasia with urinary retention   . CAD in native artery 07/28/2017  . Cardiomyopathy ([place]) 05/01/2017  . Cerebrovascular accident (CVA) due to embolism of right middle cerebral artery ([place]) 04/29/2017  . CHF (congestive heart failure) ([place])   . Chronic anticoagulation 05/18/2017  . Chronic combined systolic and diastolic CHF (congestive heart failure) ([place])   . Chronic combined systolic and diastolic heart failure ([place]) 04/24/2015  . Chronic systolic (congestive) heart failure ([place]) 07/06/2018  . Chronic systolic congestive heart failure ([place]) 04/24/2015  . Coronary artery disease   . Coronary artery disease involving native coronary artery of native heart with angina pectoris ([place])   . Debility 02/03/2018  . Diabetes mellitus type 2 in nonobese (HCC)   . Dilated cardiomyopathy ([place]) 05/01/2017  . DM (diabetes mellitus) type 2, uncontrolled, with ketoacidosis ([place]) 05/01/2017  . Essential hypertension 11/26/2016  . GERD without esophagitis 07/06/2018  . Hematuria 02/23/2018  . History of CVA (cerebrovascular accident)   . History of CVA (cerebrovascular accident) without residual deficits   . History of CVA with residual deficit   . History of kidney stones   . HLD (hyperlipidemia) 05/01/2017  . Hospital discharge follow-up 05/09/2017  . Hypertensive heart disease with heart failure ([place]) 11/26/2016  . Hyponatremia   . Hypotension 05/01/2017  . Ischemic cardiomyopathy 04/28/2018  . IVCD (intraventricular conduction defect) 04/24/2015  . LBBB (left bundle branch block) 05/18/2017  . Malnutrition of moderate degree 02/01/2018  . Mitral valve insufficiency   . Mitral valve regurgitation   . Mixed dyslipidemia 11/26/2016  . 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 On amiodarone therapy 07/28/2017  . PAF (paroxysmal atrial fibrillation) ([place]) 04/24/2015   Formatting of this note might be different from the original. CHADS2 vasc score= 3  . Paroxysmal  atrial fibrillation ([place]) 05/01/2017  . Postoperative urinary retention 02/23/2018  . Pressure injury of skin 02/02/2018  . S/P CABG (coronary artery bypass graft)   . S/P CABG x 4 01/23/2018  . S/P left atrial appendage ligation 01/26/2018   Clipped with Atricure ProClip 2  . S/P MVR (mitral valve replacement) 01/23/2018   29 mm  [facility] Ease Pericardial Tissue Valve  Serial # [id number] Model # 7300 TFX  . Screening for colon cancer 06/21/2019  . Situational mixed anxiety and depressive disorder 11/05/2017  . Stroke (cerebrum) ([place]) 04/29/2017  . Vitamin D deficiency 06/21/2019    Past Surgical History:  Procedure Laterality Date  . BRAIN SURGERY    . CARDIAC CATHETERIZATION    . CORONARY ARTERY BYPASS GRAFT N/A 01/23/2018   Procedure: CORONARY ARTERY BYPASS GRAFTING (CABG) x 4 WITH ENDOSCOPIC HARVESTING OF RIGHT GREATER SAPHENOUS VEIN: LIMA TO LAD, SVG TO RCA, SVG TO DIAG, SVG TO OM ;  Surgeon: Ivin Poot, MD;  Location: [place];  Service: Open Heart Surgery;  Laterality: N/A;  . IR PERCUTANEOUS ART THROMBECTOMY/INFUSION INTRACRANIAL INC DIAG ANGIO  04/29/2017  . IR RADIOLOGIST  EVAL & MGMT  05/17/2017  . IR US GUIDE VASC ACCESS LEFT  04/29/2017  . IR US GUIDE VASC ACCESS RIGHT  04/29/2017  . LEFT ATRIAL APPENDAGE OCCLUSION Left 01/23/2018   Procedure: LEFT ATRIAL APPENDAGE OCCLUSION USING ATRICURE ATRICLIP PRO2 LAA EXCLUSION SYSTEM  SIZE 40, LOT # [id number], CAT # [id number], EXP. DATE 2020-04-28;  Surgeon: Prescott Gum, Collier Salina, MD;  Location: [place];  Service: Open Heart Surgery;  Laterality: Left;  . MITRAL VALVE REPLACEMENT N/A 01/23/2018   Procedure: MITRAL VALVE (MV) REPLACEMENT USING MAGNA MITRAL EASE PERICARDIAL BIOPROSTHESIS, MODEL [id number], SIZE 29 MM, SERIAL # [id number], EXPIRATION  DATE 2021-03-03.;  Surgeon: Ivin Poot, MD;  Location: [place];  Service: Open Heart Surgery;  Laterality: N/A;  . MULTIPLE EXTRACTIONS WITH ALVEOLOPLASTY N/A 12/26/2017   Procedure: Extraction of tooth #'s 4,6-11, 18  -27, 30, and 31 with alveoloplasty;  Surgeon: Lenn Cal, DDS;  Location: [place];  Service: Oral Surgery;  Laterality: N/A;  . RADIOLOGY WITH ANESTHESIA N/A 04/29/2017   Procedure: IR WITH ANESTHESIA;  Surgeon: Radiologist, Medication, MD;  Location: [place];  Service: Radiology;  Laterality: N/A;  . RIGHT/LEFT HEART CATH AND CORONARY ANGIOGRAPHY N/A 08/18/2017   Procedure: RIGHT/LEFT HEART CATH AND CORONARY ANGIOGRAPHY;  Surgeon: Nelva Bush, MD;  Location: [place] CV [facility];  Service: Cardiovascular;  Laterality: N/A;  . TEE WITHOUT CARDIOVERSION N/A 09/22/2017   Procedure: TRANSESOPHAGEAL ECHOCARDIOGRAM (TEE);  Surgeon: Lelon Perla, MD;  Location: [facility] ENDOSCOPY;  Service: Cardiovascular;  Laterality: N/A;  . TEE WITHOUT CARDIOVERSION N/A 01/23/2018   Procedure: TRANSESOPHAGEAL ECHOCARDIOGRAM (TEE);  Surgeon: Prescott Gum, Collier Salina, MD;  Location: [place];  Service: Open Heart Surgery;  Laterality: N/A;  . TONSILLECTOMY      Current Medications: Current Meds  Medication Sig  . carvedilol (COREG) 3.125 MG tablet TAKE 1 TABLET(3.125 MG) BY MOUTH DAILY AFTER SUPPER  . pravastatin (PRAVACHOL) 20 MG tablet Take 20 mg by mouth daily.  . sacubitril-valsartan (ENTRESTO) 24-26 MG Take 1 tablet by mouth daily with breakfast.  . spironolactone (ALDACTONE) 25 MG tablet TAKE 1/2 TABLET(12.5 MG) BY MOUTH DAILY  . tamsulosin (FLOMAX) 0.4 MG CAPS capsule Take 0.4 mg by mouth at bedtime.   [name] warfarin (COUMADIN) 5 MG tablet Take 5.5 mg by mouth daily at 2 PM.      Allergies:   Fentanyl, Promethazine hcl, Foeniculum vulgare, Metformin and related, Penicillins, and Sulfa antibiotics   Social History   Socioeconomic History  . Marital status: Married    Spouse name: Not on file  . Number of children: Not on file  . Years of education: Not on file  . Highest education level: Not on file  Occupational History  . Not on file  Tobacco Use  . Smoking status: Former Smoker    Types: Cigarettes     Quit date: 1994    Years since quitting: 27.4  . Smokeless tobacco: Never Used  Substance and Sexual Activity  . Alcohol use: Not Currently    Alcohol/week: 3.0 standard drinks    Types: 1 Glasses of wine, 1 Cans of beer, 1 Standard drinks or equivalent per week    Comment: mix drink  . Drug use: No  . Sexual activity: Not on file  Other Topics Concern  . Not on file  Social History Narrative  . Not on file   Social Determinants of Health   Financial Resource Strain:   . Difficulty of Paying Living Expenses:   Food Insecurity:   .  Worried About Charity fundraiser in the Last Year:   . Arboriculturist in the Last Year:   Transportation Needs:   . Film/video editor (Medical):   [name] Lack of Transportation (Non-Medical):   Physical Activity:   . Days of Exercise per Week:   . Minutes of Exercise per Session:   Stress:   . Feeling of Stress :   Social Connections:   . Frequency of Communication with Friends and Family:   . Frequency of Social Gatherings with Friends and Family:   . Attends Religious Services:   . Active Member of Clubs or Organizations:   . Attends Archivist Meetings:   [name] Marital Status:      Family History: The patient's family history includes Diabetes in his father; Hypertension in his father. ROS:   Please see the history of present illness.    All other systems reviewed and are negative.  EKGs/Labs/Other Studies Reviewed:    The following studies were reviewed today: Recent Labs: 05/30/2019: ALT 15; BUN 21; Creatinine, Ser 1.12; Hemoglobin 13.6; NT-Pro BNP 1,031; Platelets 176; Potassium 4.8; Sodium 140  Recent Lipid Panel    Component Value Date/Time   CHOL 145 05/30/2019 1710   TRIG 138 05/30/2019 1710   HDL 36 (L) 05/30/2019 1710   CHOLHDL 4.0 05/30/2019 1710   CHOLHDL 3.4 04/29/2017 0451   VLDL 21 04/29/2017 0451   LDLCALC 84 05/30/2019 1710    Physical Exam:    VS:  BP 140/88   Pulse 60   Ht _0  (1.803 m)   Wt  202 lb (91.6 kg)   SpO2 98%   BMI 28.17 kg/m     Wt Readings from Last 3 Encounters:  11/21/19 202 lb (91.6 kg)  08/24/19 193 lb (87.5 kg)  05/30/19 192 lb (87.1 kg)     GEN:  Well nourished, well developed in no acute distress HEENT: Normal NECK: No JVD; No carotid bruits LYMPHATICS: No lymphadenopathy CARDIAC: Soft S1 no S3 RRR, no murmurs, rubs, gallops RESPIRATORY:  Clear to auscultation without rales, wheezing or rhonchi  ABDOMEN: Soft, non-tender, non-distended MUSCULOSKELETAL:  No edema; No deformity  SKIN: Warm and dry NEUROLOGIC:  Alert and oriented x 3 PSYCHIATRIC:  Normal affect    Signed, Shirlee More, MD  11/21/2019 4:46 PM    [facility]

## 2019-11-22 ENCOUNTER — Telehealth: Payer: Self-pay

## 2019-11-22 LAB — COMPREHENSIVE METABOLIC PANEL
ALT: 14 IU/L (ref 0–44)
AST: 18 IU/L (ref 0–40)
Albumin/Globulin Ratio: 1.8 (ref 1.2–2.2)
Albumin: 4.4 g/dL (ref 3.7–4.7)
Alkaline Phosphatase: 92 IU/L (ref 48–121)
BUN/Creatinine Ratio: 21 (ref 10–24)
BUN: 18 mg/dL (ref 8–27)
Bilirubin Total: 0.4 mg/dL (ref 0.0–1.2)
CO2: 24 mmol/L (ref 20–29)
Calcium: 9.3 mg/dL (ref 8.6–10.2)
Chloride: 104 mmol/L (ref 96–106)
Creatinine, Ser: 0.86 mg/dL (ref 0.76–1.27)
GFR calc Af Amer: 101 mL/min/{1.73_m2} (ref >59)
GFR calc non Af Amer: 87 mL/min/{1.73_m2} (ref >59)
Globulin, Total: 2.5 g/dL (ref 1.5–4.5)
Glucose: 142 mg/dL — ABNORMAL HIGH (ref 65–99)
Potassium: 5 mmol/L (ref 3.5–5.2)
Sodium: 138 mmol/L (ref 134–144)
Total Protein: 6.9 g/dL (ref 6.0–8.5)

## 2019-11-22 LAB — PRO B NATRIURETIC PEPTIDE: NT-Pro BNP: 1075 pg/mL — ABNORMAL HIGH (ref 0–376)

## 2019-11-22 LAB — LIPID PANEL
Chol/HDL Ratio: 4.5 ratio (ref 0.0–5.0)
Cholesterol, Total: 144 mg/dL (ref 100–199)
HDL: 32 mg/dL — ABNORMAL LOW (ref >39)
LDL Chol Calc (NIH): 80 mg/dL (ref 0–99)
Triglycerides: 188 mg/dL — ABNORMAL HIGH (ref 0–149)
VLDL Cholesterol Cal: 32 mg/dL (ref 5–40)

## 2019-11-22 NOTE — Telephone Encounter (Signed)
Spoke with patient regarding results and recommendation.  Patient verbalizes understanding and is agreeable to plan of care. Advised patient to call back with any issues or concerns.  

## 2019-11-22 NOTE — Telephone Encounter (Signed)
-----   Message from Brian J Munley, MD sent at 11/22/2019  8:07 AM EDT ----- Normal or stable result  No changes 

## 2020-01-25 ENCOUNTER — Other Ambulatory Visit: Payer: Self-pay

## 2020-01-25 ENCOUNTER — Encounter: Payer: Self-pay | Admitting: Sports Medicine

## 2020-01-25 ENCOUNTER — Ambulatory Visit: Payer: Medicare Other | Admitting: Sports Medicine

## 2020-01-25 DIAGNOSIS — B351 Tinea unguium: Secondary | ICD-10-CM

## 2020-01-25 DIAGNOSIS — Z8673 Personal history of transient ischemic attack (TIA), and cerebral infarction without residual deficits: Secondary | ICD-10-CM | POA: Diagnosis not present

## 2020-01-25 DIAGNOSIS — M79674 Pain in right toe(s): Secondary | ICD-10-CM

## 2020-01-25 DIAGNOSIS — M79675 Pain in left toe(s): Secondary | ICD-10-CM | POA: Diagnosis not present

## 2020-01-25 DIAGNOSIS — D689 Coagulation defect, unspecified: Secondary | ICD-10-CM

## 2020-01-25 NOTE — Progress Notes (Signed)
Subjective: Nathan [name]. is a 74 y.o. male patient seen today in office with complaint of mildly painful thickened and elongated toenails; unable to trim. Patient admits that he is still on Warafin like previous for CAD/CVA history.  Denies any other complaints at this time.  Patient Active Problem List   Diagnosis Date Noted  . Screening for colon cancer 06/21/2019  . Vitamin D deficiency 06/21/2019  . Chronic systolic (congestive) heart failure (HCC) 07/06/2018  . GERD without esophagitis 07/06/2018  . Ischemic cardiomyopathy 04/28/2018  . Hematuria 02/23/2018  . Bacterial UTI   . History of CVA with residual deficit   . Benign prostatic hyperplasia with urinary retention   . History of CVA (cerebrovascular accident) without residual deficits   . Anemia of chronic disease   . Debility 02/03/2018  . 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 Pressure injury of skin 02/02/2018  . Malnutrition of moderate degree 02/01/2018  . Coronary artery disease involving native coronary artery of native heart with angina pectoris (HCC)   . S/P CABG (coronary artery bypass graft)   . Atrial fibrillation (HCC)   . Chronic combined systolic and diastolic CHF (congestive heart failure) (HCC)   . Diabetes mellitus type 2 in nonobese (HCC)   . Essential hypertension   . History of CVA (cerebrovascular accident)   . Acute blood loss anemia   . Hyponatremia   . S/P left atrial appendage ligation 01/26/2018  . S/P CABG x 4 01/23/2018  . S/P MVR (mitral valve replacement) 01/23/2018  . Situational mixed anxiety and depressive disorder 11/05/2017  . Mitral valve insufficiency   . On amiodarone therapy 07/28/2017  . CAD in native artery 07/28/2017  . Chronic anticoagulation 05/18/2017  . LBBB (left bundle branch block) 05/18/2017  . Hospital discharge follow-up 05/09/2017  . Paroxysmal atrial fibrillation (HCC) 05/01/2017  . Dilated cardiomyopathy  (HCC) 05/01/2017  . DM (diabetes mellitus) type 2, uncontrolled, with ketoacidosis (HCC) 05/01/2017  . Hypotension 05/01/2017  . HLD (hyperlipidemia) 05/01/2017  . Cerebrovascular accident (CVA) due to embolism of right middle cerebral artery (HCC) 04/29/2017  . Hypertensive heart disease with heart failure (HCC) 11/26/2016  . Mixed dyslipidemia 11/26/2016  . Chronic systolic congestive heart failure (HCC) 04/24/2015  . IVCD (intraventricular conduction defect) 04/24/2015  . PAF (paroxysmal atrial fibrillation) (HCC) 04/24/2015  . Cardiomyopathy (HCC) 04/24/2015    Current Outpatient Medications on File Prior to Visit  Medication Sig Dispense Refill  . bethanechol (URECHOLINE) 50 MG tablet Take 50 mg by mouth 2 (two) times daily.     . carvedilol (COREG) 3.125 MG tablet TAKE 1 TABLET(3.125 MG) BY MOUTH DAILY AFTER SUPPER 90 tablet 2  . ergocalciferol (VITAMIN D2) 1.25 MG (50000 UT) capsule Take by mouth.    . pantoprazole (PROTONIX) 40 MG tablet Take 1 tablet (40 mg total) by mouth daily. (Patient not taking: Reported on 11/21/2019)    . pravastatin (PRAVACHOL) 20 MG tablet Take 20 mg by mouth daily.    . sacubitril-valsartan (ENTRESTO) 24-26 MG Take 1 tablet by mouth 2 (two) times daily. 180 tablet 3  . spironolactone (ALDACTONE) 25 MG tablet TAKE 1/2 TABLET(12.5 MG) BY MOUTH DAILY 45 tablet 2  . tamsulosin (FLOMAX) 0.4 MG CAPS capsule Take 0.4 mg by mouth at bedtime.   3  . warfarin (COUMADIN) 3 MG tablet Take 3 mg by mouth daily.    [name] warfarin (COUMADIN) 5 MG tablet Take 5.5 mg by mouth daily  at 2 PM.      No current facility-administered medications on file prior to visit.    Allergies  Allergen Reactions  . Fentanyl Shortness Of Breath  . Promethazine Hcl Anaphylaxis and Other (See Comments)    Cardiac arrest  . Foeniculum Vulgare Other (See Comments)    Fennel bulbs--nausea only  . Metformin And Related Diarrhea  . Penicillins Nausea Only    Has patient had a PCN reaction  causing immediate rash, facial/tongue/throat swelling, SOB or lightheadedness with hypotension: no Has patient had a PCN reaction causing severe rash involving mucus membranes or skin necrosis: no Has patient had a PCN reaction that required hospitalization: no Has patient had a PCN reaction occurring within the last 10 years: yes If all of the above answers are "NO", then may proceed with Cephalosporin use.   . Sulfa Antibiotics Other (See Comments)    G.I. Upset    Objective: Physical Exam  General: Well developed, nourished, no acute distress, awake, alert and oriented x 3  Vascular: Dorsalis pedis artery 2/4 bilateral, Posterior tibial artery 1/4 bilateral, skin temperature warm to warm proximal to distal bilateral lower extremities, no varicosities, pedal hair present bilateral.  Neurological: Gross sensation present via light touch bilateral.   Dermatological: Skin is warm, dry, and supple bilateral, Nails 1-10 are tender, long, thick, and discolored with mild subungal debris, no webspace macerations present bilateral, no open lesions present bilateral, no callus/corns/hyperkeratotic tissue present bilateral. No signs of infection bilateral.  Musculoskeletal: No symptomatic boney deformities noted bilateral. Muscular strength within normal limits without painon range of motion. No pain with calf compression bilateral.  Assessment and Plan:  Problem List Items Addressed This Visit      Other   History of CVA (cerebrovascular accident) without residual deficits    Other Visit Diagnoses    Pain due to onychomycosis of toenails of both feet    -  Primary   Coagulation defect (HCC)          -Examined patient.  -Mechanically debrided and reduced mycotic nails with sterile nail nipper and dremel nail file without incident. -Patient to return in 3 months for follow up evaluation or sooner if symptoms worsen.  Asencion Islam, DPM

## 2020-02-21 NOTE — Progress Notes (Deleted)
Cardiology Office Note:    Date:  02/21/2020   ID:  Nathan PhilipsRandleman D Pegg Jr., DOB [birth year], MRN [id number]  PCP:  Hadley Penobbins, Robert A, MD  Cardiologist:  Norman HerrlichBrian Nikaya Nasby, MD    Referring MD: Hadley Penobbins, Robert A, MD    ASSESSMENT:    No diagnosis found. PLAN:    In order of problems listed above:  1. ***   Next appointment: ***   Medication Adjustments/Labs and Tests Ordered: Current medicines are reviewed at length with the patient today.  Concerns regarding medicines are outlined above.  No orders of the defined types were placed in this encounter.  No orders of the defined types were placed in this encounter.   No chief complaint on file.   History of Present Illness:    Nathan D [name] is a 74 y.o. male with a hx of CABG-bioprosthetic mitral valve replacement-left atrial appendage closure, heart failure left bundle branch block hypertensive heart disease and paroxysmal atrial fibrillation with post open heart surgery stroke last seen 11/21/2019.he has declined an ICD. Compliance with diet, lifestyle and medications: ***   Ref Range & Units 3 mo ago 8 mo ago 1 yr ago  NT-Pro BNP 0 - 376 pg/mL 1,075High  1,031High CM  817   Echo 07/27/2019:  1. Left ventricular ejection fraction, by visual estimation, is 20 to  25%. The left ventricle has normal function. Left ventricular septal wall  thickness was mildly increased. Moderately increased left ventricular  posterior wall thickness. There is  mildly increased left ventricular hypertrophy.  2. Global right ventricle has moderately reduced systolic function.The  right ventricular size is normal. No increase in right ventricular wall  thickness.  3. Left atrial size was normal.  4. Right atrial size was normal.  5. 29 mm Mitral Magna Ease Pericardial bioprosthetic valve is present in the mitral position.  6. The mitral valve has been repaired/replaced. No evidence of mitral  valve regurgitation. No  evidence of dysfunction. Past Medical History:  Diagnosis Date   Acute blood loss anemia    Anemia of chronic disease    Atrial fibrillation (HCC)    Atrial fibrillation with RVR (HCC) 05/01/2017   Bacterial UTI    Benign prostatic hyperplasia with urinary retention    CAD in native artery 07/28/2017   Cardiomyopathy (HCC) 05/01/2017   Cerebrovascular accident (CVA) due to embolism of right middle cerebral artery (HCC) 04/29/2017   CHF (congestive heart failure) (HCC)    Chronic anticoagulation 05/18/2017   Chronic combined systolic and diastolic CHF (congestive heart failure) (HCC)    Chronic combined systolic and diastolic heart failure (HCC) 04/24/2015   Chronic systolic (congestive) heart failure (HCC) 07/06/2018   Chronic systolic congestive heart failure (HCC) 04/24/2015   Coronary artery disease    Coronary artery disease involving native coronary artery of native heart with angina pectoris (HCC)    Debility 02/03/2018   Diabetes mellitus type 2 in nonobese (HCC)    Dilated cardiomyopathy (HCC) 05/01/2017   DM (diabetes mellitus) type 2, uncontrolled, with ketoacidosis (HCC) 05/01/2017   Essential hypertension 11/26/2016   GERD without esophagitis 07/06/2018   Hematuria 02/23/2018   History of CVA (cerebrovascular accident)    History of CVA (cerebrovascular accident) without residual deficits    History of CVA with residual deficit    History of kidney stones    HLD (hyperlipidemia) 05/01/2017   Hospital discharge follow-up 05/09/2017   Hypertensive heart disease with heart failure (HCC) 11/26/2016   Hyponatremia  Hypotension 05/01/2017   Ischemic cardiomyopathy 04/28/2018   IVCD (intraventricular conduction defect) 04/24/2015   LBBB (left bundle branch block) 05/18/2017   Malnutrition of moderate degree 02/01/2018   Mitral valve insufficiency    Mitral valve regurgitation    Mixed dyslipidemia 11/26/2016   Occlusion of right middle cerebral  artery not resulting in cerebral infarction    On amiodarone therapy 07/28/2017   PAF (paroxysmal atrial fibrillation) (HCC) 04/24/2015   Formatting of this note might be different from the original. CHADS2 vasc score= 3   Paroxysmal atrial fibrillation (HCC) 05/01/2017   Postoperative urinary retention 02/23/2018   Pressure injury of skin 02/02/2018   S/P CABG (coronary artery bypass graft)    S/P CABG x 4 01/23/2018   S/P left atrial appendage ligation 01/26/2018   Clipped with Atricure ProClip 2   S/P MVR (mitral valve replacement) 01/23/2018   29 mm  [facility] Ease Pericardial Tissue Valve  Serial # [id number] Model # 7300 TFX   Screening for colon cancer 06/21/2019   Situational mixed anxiety and depressive disorder 11/05/2017   Stroke (cerebrum) (HCC) 04/29/2017   Vitamin D deficiency 06/21/2019    Past Surgical History:  Procedure Laterality Date   BRAIN SURGERY     CARDIAC CATHETERIZATION     CORONARY ARTERY BYPASS GRAFT N/A 01/23/2018   Procedure: CORONARY ARTERY BYPASS GRAFTING (CABG) x 4 WITH ENDOSCOPIC HARVESTING OF RIGHT GREATER SAPHENOUS VEIN: LIMA TO LAD, SVG TO RCA, SVG TO DIAG, SVG TO OM ;  Surgeon: Kerin Perna, MD;  Location: [facility] OR;  Service: Open Heart Surgery;  Laterality: N/A;   IR PERCUTANEOUS ART THROMBECTOMY/INFUSION INTRACRANIAL INC DIAG ANGIO  04/29/2017   IR RADIOLOGIST EVAL & MGMT  05/17/2017   IR US GUIDE VASC ACCESS LEFT  04/29/2017   IR US GUIDE VASC ACCESS RIGHT  04/29/2017   LEFT ATRIAL APPENDAGE OCCLUSION Left 01/23/2018   Procedure: LEFT ATRIAL APPENDAGE OCCLUSION USING ATRICURE ATRICLIP PRO2 LAA EXCLUSION SYSTEM  SIZE 40, LOT # [id number], CAT # [id number], EXP. DATE 2020-04-28;  Surgeon: Donata Clay, Theron Arista, MD;  Location: [facility] OR;  Service: Open Heart Surgery;  Laterality: Left;   MITRAL VALVE REPLACEMENT N/A 01/23/2018   Procedure: MITRAL VALVE (MV) REPLACEMENT USING MAGNA MITRAL EASE PERICARDIAL BIOPROSTHESIS, MODEL 7300TFX, SIZE 29 MM, SERIAL #  [id number], EXPIRATION  DATE 2021-03-03.;  Surgeon: Kerin Perna, MD;  Location: [facility] OR;  Service: Open Heart Surgery;  Laterality: N/A;   MULTIPLE EXTRACTIONS WITH ALVEOLOPLASTY N/A 12/26/2017   Procedure: Extraction of tooth #'s 4,6-11, 18 -27, 30, and 31 with alveoloplasty;  Surgeon: [name], DDS;  Location: [facility] OR;  Service: Oral Surgery;  Laterality: N/A;   RADIOLOGY WITH ANESTHESIA N/A 04/29/2017   Procedure: IR WITH ANESTHESIA;  Surgeon: Radiologist, Medication, MD;  Location: [facility] OR;  Service: Radiology;  Laterality: N/A;   RIGHT/LEFT HEART CATH AND CORONARY ANGIOGRAPHY N/A 08/18/2017   Procedure: RIGHT/LEFT HEART CATH AND CORONARY ANGIOGRAPHY;  Surgeon: [name], MD;  Location: [facility] INVASIVE CV LAB;  Service: Cardiovascular;  Laterality: N/A;   TEE WITHOUT CARDIOVERSION N/A 09/22/2017   Procedure: TRANSESOPHAGEAL ECHOCARDIOGRAM (TEE);  Surgeon: [name], MD;  Location: [facility] ENDOSCOPY;  Service: Cardiovascular;  Laterality: N/A;   TEE WITHOUT CARDIOVERSION N/A 01/23/2018   Procedure: TRANSESOPHAGEAL ECHOCARDIOGRAM (TEE);  Surgeon: Donata Clay, Theron Arista, MD;  Location: [facility] OR;  Service: Open Heart Surgery;  Laterality: N/A;   TONSILLECTOMY      Current Medications: No outpatient medications have been marked  as taking for the 02/22/20 encounter (Appointment) with Baldo Daub, MD.     Allergies:   Fentanyl, Promethazine hcl, Foeniculum vulgare, Metformin and related, Penicillins, and Sulfa antibiotics   Social History   Socioeconomic History   Marital status: Married    Spouse name: Not on file   Number of children: Not on file   Years of education: Not on file   Highest education level: Not on file  Occupational History   Not on file  Tobacco Use   Smoking status: Former Smoker    Types: Cigarettes    Quit date: 1994    Years since quitting: 27.6   Smokeless tobacco: Never Used  Building services engineer Use: Never used  Substance and Sexual Activity     Alcohol use: Not Currently    Alcohol/week: 3.0 standard drinks    Types: 1 Glasses of wine, 1 Cans of beer, 1 Standard drinks or equivalent per week    Comment: mix drink   Drug use: No   Sexual activity: Not on file  Other Topics Concern   Not on file  Social History Narrative   Not on file   Social Determinants of Health   Financial Resource Strain:    Difficulty of Paying Living Expenses: Not on file  Food Insecurity:    Worried About Programme researcher, broadcasting/film/video in the Last Year: Not on file   [employer] of Food in the Last Year: Not on file  Transportation Needs:    Lack of Transportation (Medical): Not on file   Lack of Transportation (Non-Medical): Not on file  Physical Activity:    Days of Exercise per Week: Not on file   Minutes of Exercise per Session: Not on file  Stress:    Feeling of Stress : Not on file  Social Connections:    Frequency of Communication with Friends and Family: Not on file   Frequency of Social Gatherings with Friends and Family: Not on file   Attends Religious Services: Not on file   Active Member of Clubs or Organizations: Not on file   Attends Banker Meetings: Not on file   Marital Status: Not on file     Family History: The patient's ***family history includes Diabetes in his father; Hypertension in his father. ROS:   Please see the history of present illness.    All other systems reviewed and are negative.  EKGs/Labs/Other Studies Reviewed:    The following studies were reviewed today:  EKG:  EKG ordered today and personally reviewed.  The ekg ordered today demonstrates ***  Recent Labs: 05/30/2019: Hemoglobin 13.6; Platelets 176 11/21/2019: ALT 14; BUN 18; Creatinine, Ser 0.86; NT-Pro BNP 1,075; Potassium 5.0; Sodium 138  Recent Lipid Panel    Component Value Date/Time   CHOL 144 11/21/2019 1651   TRIG 188 (H) 11/21/2019 1651   HDL 32 (L) 11/21/2019 1651   CHOLHDL 4.5 11/21/2019 1651   CHOLHDL 3.4  04/29/2017 0451   VLDL 21 04/29/2017 0451   LDLCALC 80 11/21/2019 1651    Physical Exam:    VS:  There were no vitals taken for this visit.    Wt Readings from Last 3 Encounters:  11/21/19 202 lb (91.6 kg)  08/24/19 193 lb (87.5 kg)  05/30/19 192 lb (87.1 kg)     GEN: *** Well nourished, well developed in no acute distress HEENT: Normal NECK: No JVD; No carotid bruits LYMPHATICS: No lymphadenopathy CARDIAC: ***RRR, no murmurs, rubs, gallops  RESPIRATORY:  Clear to auscultation without rales, wheezing or rhonchi  ABDOMEN: Soft, non-tender, non-distended MUSCULOSKELETAL:  No edema; No deformity  SKIN: Warm and dry NEUROLOGIC:  Alert and oriented x 3 PSYCHIATRIC:  Normal affect    Signed, Norman Herrlich, MD  02/21/2020 2:53 PM    [facility]

## 2020-02-22 ENCOUNTER — Ambulatory Visit: Payer: Medicare Other | Admitting: Cardiology

## 2020-03-28 DEATH — deceased

## 2020-04-23 DIAGNOSIS — Z87442 Personal history of urinary calculi: Secondary | ICD-10-CM | POA: Insufficient documentation

## 2020-04-23 DIAGNOSIS — I509 Heart failure, unspecified: Secondary | ICD-10-CM | POA: Insufficient documentation

## 2020-04-23 DIAGNOSIS — I2510 Atherosclerotic heart disease of native coronary artery without angina pectoris: Secondary | ICD-10-CM | POA: Insufficient documentation

## 2020-04-23 DIAGNOSIS — I340 Nonrheumatic mitral (valve) insufficiency: Secondary | ICD-10-CM | POA: Insufficient documentation

## 2020-04-27 NOTE — Progress Notes (Deleted)
Cardiology Office Note:    Date:  04/27/2020   ID:  Nathan [name]., DOB [birth year], MRN [id number]  PCP:  [name], MD  Cardiologist:  Norman Herrlich, MD    Referring MD: [name], MD    ASSESSMENT:    No diagnosis found. PLAN:    In order of problems listed above:  1. ***   Next appointment: ***   Medication Adjustments/Labs and Tests Ordered: Current medicines are reviewed at length with the patient today.  Concerns regarding medicines are outlined above.  No orders of the defined types were placed in this encounter.  No orders of the defined types were placed in this encounter.   No chief complaint on file.   History of Present Illness:    Nathan [name]. is a 74 y.o. male with a hx of CAD with CABG bioprosthetic mitral valve replacement left bundle branch block hypertensive heart disease with chronic systolic heart failure paroxysmal atrial fibrillation left atrial surgical closure and postoperative stroke at the time of heart surgery last seen 11/21/2019.  His ejection fraction on guideline directed therapy remains severely reduced 20 to 25% and echocardiogram 07/27/2019 shows normal 29 mm mitral magna ease pericardial bioprosthetic valve function.  He has declined consideration of ICD therapy. Compliance with diet, lifestyle and medications: *** Past Medical History:  Diagnosis Date  . Acute blood loss anemia   . Anemia of chronic disease   . Atrial fibrillation (HCC)   . Atrial fibrillation with RVR (HCC) 05/01/2017  . Bacterial UTI   . Benign prostatic hyperplasia with urinary retention   . CAD in native artery 07/28/2017  . Cardiomyopathy (HCC) 05/01/2017  . Cerebrovascular accident (CVA) due to embolism of right middle cerebral artery (HCC) 04/29/2017  . CHF (congestive heart failure) (HCC)   . Chronic anticoagulation 05/18/2017  . Chronic combined systolic and diastolic CHF (congestive heart failure) (HCC)   . Chronic combined  systolic and diastolic heart failure (HCC) 04/24/2015  . Chronic systolic (congestive) heart failure (HCC) 07/06/2018  . Chronic systolic congestive heart failure (HCC) 04/24/2015  . Coronary artery disease   . Coronary artery disease involving native coronary artery of native heart with angina pectoris (HCC)   . Debility 02/03/2018  . Diabetes mellitus type 2 in nonobese (HCC)   . Dilated cardiomyopathy (HCC) 05/01/2017  . DM (diabetes mellitus) type 2, uncontrolled, with ketoacidosis (HCC) 05/01/2017  . Essential hypertension 11/26/2016  . GERD without esophagitis 07/06/2018  . Hematuria 02/23/2018  . History of CVA (cerebrovascular accident)   . History of CVA (cerebrovascular accident) without residual deficits   . History of CVA with residual deficit   . History of kidney stones   . HLD (hyperlipidemia) 05/01/2017  . Hospital discharge follow-up 05/09/2017  . Hypertensive heart disease with heart failure (HCC) 11/26/2016  . Hyponatremia   . Hypotension 05/01/2017  . Ischemic cardiomyopathy 04/28/2018  . IVCD (intraventricular conduction defect) 04/24/2015  . LBBB (left bundle branch block) 05/18/2017  . Malnutrition of moderate degree 02/01/2018  . Mitral valve insufficiency   . Mitral valve regurgitation   . Mixed dyslipidemia 11/26/2016  . 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 On amiodarone therapy 07/28/2017  . PAF (paroxysmal atrial fibrillation) (HCC) 04/24/2015   Formatting of this note might be different from the original. CHADS2 vasc score= 3  . Paroxysmal atrial fibrillation (HCC) 05/01/2017  . Postoperative urinary retention 02/23/2018  . Pressure injury of  skin 02/02/2018  . S/P CABG (coronary artery bypass graft)   . S/P CABG x 4 01/23/2018  . S/P left atrial appendage ligation 01/26/2018   Clipped with Atricure ProClip 2  . S/P MVR (mitral valve replacement) 01/23/2018   29 mm  [facility] Ease Pericardial Tissue Valve  Serial # [id number]  Model # 7300 TFX  . Screening for colon cancer 06/21/2019  . Situational mixed anxiety and depressive disorder 11/05/2017  . Stroke (cerebrum) (HCC) 04/29/2017  . Vitamin D deficiency 06/21/2019    Past Surgical History:  Procedure Laterality Date  . BRAIN SURGERY    . CARDIAC CATHETERIZATION    . CORONARY ARTERY BYPASS GRAFT N/A 01/23/2018   Procedure: CORONARY ARTERY BYPASS GRAFTING (CABG) x 4 WITH ENDOSCOPIC HARVESTING OF RIGHT GREATER SAPHENOUS VEIN: LIMA TO LAD, SVG TO RCA, SVG TO DIAG, SVG TO OM ;  Surgeon: Kerin Perna, MD;  Location: [facility] OR;  Service: Open Heart Surgery;  Laterality: N/A;  . IR PERCUTANEOUS ART THROMBECTOMY/INFUSION INTRACRANIAL INC DIAG ANGIO  04/29/2017  . IR RADIOLOGIST EVAL & MGMT  05/17/2017  . IR US GUIDE VASC ACCESS LEFT  04/29/2017  . IR US GUIDE VASC ACCESS RIGHT  04/29/2017  . LEFT ATRIAL APPENDAGE OCCLUSION Left 01/23/2018   Procedure: LEFT ATRIAL APPENDAGE OCCLUSION USING ATRICURE ATRICLIP PRO2 LAA EXCLUSION SYSTEM  SIZE 40, LOT # [id number], CAT # [id number], EXP. DATE 2020-04-28;  Surgeon: Donata Clay, Theron Arista, MD;  Location: [facility] OR;  Service: Open Heart Surgery;  Laterality: Left;  . MITRAL VALVE REPLACEMENT N/A 01/23/2018   Procedure: MITRAL VALVE (MV) REPLACEMENT USING MAGNA MITRAL EASE PERICARDIAL BIOPROSTHESIS, MODEL 7300TFX, SIZE 29 MM, SERIAL # [id number], EXPIRATION  DATE 2021-03-03.;  Surgeon: Kerin Perna, MD;  Location: [facility] OR;  Service: Open Heart Surgery;  Laterality: N/A;  . MULTIPLE EXTRACTIONS WITH ALVEOLOPLASTY N/A 12/26/2017   Procedure: Extraction of tooth #'s 4,6-11, 18 -27, 30, and 31 with alveoloplasty;  Surgeon: [name], DDS;  Location: [facility] OR;  Service: Oral Surgery;  Laterality: N/A;  . RADIOLOGY WITH ANESTHESIA N/A 04/29/2017   Procedure: IR WITH ANESTHESIA;  Surgeon: Radiologist, Medication, MD;  Location: [facility] OR;  Service: Radiology;  Laterality: N/A;  . RIGHT/LEFT HEART CATH AND CORONARY ANGIOGRAPHY N/A 08/18/2017   Procedure: RIGHT/LEFT  HEART CATH AND CORONARY ANGIOGRAPHY;  Surgeon: [name], MD;  Location: [facility] INVASIVE CV LAB;  Service: Cardiovascular;  Laterality: N/A;  . TEE WITHOUT CARDIOVERSION N/A 09/22/2017   Procedure: TRANSESOPHAGEAL ECHOCARDIOGRAM (TEE);  Surgeon: [name], MD;  Location: [facility] ENDOSCOPY;  Service: Cardiovascular;  Laterality: N/A;  . TEE WITHOUT CARDIOVERSION N/A 01/23/2018   Procedure: TRANSESOPHAGEAL ECHOCARDIOGRAM (TEE);  Surgeon: Donata Clay, Theron Arista, MD;  Location: [facility] OR;  Service: Open Heart Surgery;  Laterality: N/A;  . TONSILLECTOMY      Current Medications: No outpatient medications have been marked as taking for the 04/28/20 encounter (Appointment) with Baldo Daub, MD.     Allergies:   Fentanyl, Promethazine hcl, Foeniculum vulgare, Metformin and related, Penicillins, and Sulfa antibiotics   Social History   Socioeconomic History  . Marital status: Married    Spouse name: Not on file  . Number of children: Not on file  . Years of education: Not on file  . Highest education level: Not on file  Occupational History  . Not on file  Tobacco Use  . Smoking status: Former Smoker    Types: Cigarettes    Quit date: 1994    Years  since quitting: 27.8  . Smokeless tobacco: Never Used  Vaping Use  . Vaping Use: Never used  Substance and Sexual Activity  . Alcohol use: Not Currently    Alcohol/week: 3.0 standard drinks    Types: 1 Glasses of wine, 1 Cans of beer, 1 Standard drinks or equivalent per week    Comment: mix drink  . Drug use: No  . Sexual activity: Not on file  Other Topics Concern  . Not on file  Social History Narrative  . Not on file   Social Determinants of Health   Financial Resource Strain:   . Difficulty of Paying Living Expenses: Not on file  Food Insecurity:   . Worried About Programme researcher, broadcasting/film/video in the Last Year: Not on file  . Ran Out of Food in the Last Year: Not on file  Transportation Needs:   . Lack of Transportation (Medical): Not on  file  . Lack of Transportation (Non-Medical): Not on file  Physical Activity:   . Days of Exercise per Week: Not on file  . Minutes of Exercise per Session: Not on file  Stress:   . Feeling of Stress : Not on file  Social Connections:   . Frequency of Communication with Friends and Family: Not on file  . Frequency of Social Gatherings with Friends and Family: Not on file  . Attends Religious Services: Not on file  . Active Member of Clubs or Organizations: Not on file  . Attends Banker Meetings: Not on file  . Marital Status: Not on file     Family History: The patient's ***family history includes Diabetes in his father; Hypertension in his father. ROS:   Please see the history of present illness.    All other systems reviewed and are negative.  EKGs/Labs/Other Studies Reviewed:    The following studies were reviewed today:  EKG:  EKG ordered today and personally reviewed.  The ekg ordered today demonstrates ***  Recent Labs: 05/30/2019: Hemoglobin 13.6; Platelets 176 11/21/2019: ALT 14; BUN 18; Creatinine, Ser 0.86; NT-Pro BNP 1,075; Potassium 5.0; Sodium 138  Recent Lipid Panel    Component Value Date/Time   CHOL 144 11/21/2019 1651   TRIG 188 (H) 11/21/2019 1651   HDL 32 (L) 11/21/2019 1651   CHOLHDL 4.5 11/21/2019 1651   CHOLHDL 3.4 04/29/2017 0451   VLDL 21 04/29/2017 0451   LDLCALC 80 11/21/2019 1651    Physical Exam:    VS:  There were no vitals taken for this visit.    Wt Readings from Last 3 Encounters:  11/21/19 202 lb (91.6 kg)  08/24/19 193 lb (87.5 kg)  05/30/19 192 lb (87.1 kg)     GEN: *** Well nourished, well developed in no acute distress HEENT: Normal NECK: No JVD; No carotid bruits LYMPHATICS: No lymphadenopathy CARDIAC: ***RRR, no murmurs, rubs, gallops RESPIRATORY:  Clear to auscultation without rales, wheezing or rhonchi  ABDOMEN: Soft, non-tender, non-distended MUSCULOSKELETAL:  No edema; No deformity  SKIN: Warm and  dry NEUROLOGIC:  Alert and oriented x 3 PSYCHIATRIC:  Normal affect    Signed, Norman Herrlich, MD  04/27/2020 6:25 PM    [facility]

## 2020-04-28 ENCOUNTER — Ambulatory Visit: Payer: Self-pay | Admitting: Cardiology

## 2020-05-02 ENCOUNTER — Ambulatory Visit: Payer: Medicare Other | Admitting: Sports Medicine
